# Patient Record
Sex: Male | Born: 1950
Health system: Southern US, Community
[De-identification: ages and names within clinical notes are randomized; demographics above are authoritative.]

## PROBLEM LIST (undated history)

## (undated) DIAGNOSIS — I10 Essential (primary) hypertension: Secondary | ICD-10-CM

## (undated) DIAGNOSIS — I739 Peripheral vascular disease, unspecified: Secondary | ICD-10-CM

## (undated) DIAGNOSIS — I2510 Atherosclerotic heart disease of native coronary artery without angina pectoris: Secondary | ICD-10-CM

## (undated) DIAGNOSIS — I509 Heart failure, unspecified: Secondary | ICD-10-CM

## (undated) DIAGNOSIS — Z720 Tobacco use: Secondary | ICD-10-CM

## (undated) DIAGNOSIS — K8020 Calculus of gallbladder without cholecystitis without obstruction: Secondary | ICD-10-CM

## (undated) DIAGNOSIS — I4821 Permanent atrial fibrillation: Secondary | ICD-10-CM

## (undated) DIAGNOSIS — D696 Thrombocytopenia, unspecified: Secondary | ICD-10-CM

## (undated) DIAGNOSIS — I4819 Other persistent atrial fibrillation: Secondary | ICD-10-CM

## (undated) DIAGNOSIS — Z5189 Encounter for other specified aftercare: Secondary | ICD-10-CM

## (undated) DIAGNOSIS — F1010 Alcohol abuse, uncomplicated: Secondary | ICD-10-CM

## (undated) DIAGNOSIS — F1410 Cocaine abuse, uncomplicated: Secondary | ICD-10-CM

## (undated) DIAGNOSIS — L819 Disorder of pigmentation, unspecified: Secondary | ICD-10-CM

## (undated) DIAGNOSIS — R768 Other specified abnormal immunological findings in serum: Secondary | ICD-10-CM

## (undated) DIAGNOSIS — I499 Cardiac arrhythmia, unspecified: Secondary | ICD-10-CM

## (undated) HISTORY — DX: Disorder of pigmentation, unspecified: L81.9

## (undated) HISTORY — DX: Encounter for other specified aftercare: Z51.89

## (undated) HISTORY — PX: CAROTID ARTERY ANGIOPLASTY: SHX1300

## (undated) HISTORY — PX: ENDOVASCULAR STENT INSERTION: SHX5161

## (undated) HISTORY — PX: OTHER SURGICAL HISTORY: SHX169

## (undated) HISTORY — DX: Peripheral vascular disease, unspecified: I73.9

## (undated) HISTORY — DX: Other persistent atrial fibrillation: I48.19

## (undated) HISTORY — PX: DOPPLER ECHOCARDIOGRAPHY: SHX263

## (undated) HISTORY — PX: CARDIOVASCULAR STRESS TEST: SHX262

## (undated) HISTORY — DX: Heart failure, unspecified: I50.9

## (undated) HISTORY — DX: Thrombocytopenia, unspecified: D69.6

---

## 2007-09-30 ENCOUNTER — Encounter: Admission: RE | Admit: 2007-09-30 | Discharge: 2007-09-30 | Payer: Self-pay | Admitting: Cardiology

## 2007-10-06 ENCOUNTER — Ambulatory Visit (HOSPITAL_COMMUNITY): Admission: RE | Admit: 2007-10-06 | Discharge: 2007-10-06 | Payer: Self-pay | Admitting: Cardiovascular Disease

## 2011-02-05 NOTE — Cardiovascular Report (Signed)
NAME:  Kevin [name], Kevin [name]             ACCOUNT NO.:  [id number]   MEDICAL RECORD NO.:  [id number]          PATIENT TYPE:  AMB   LOCATION:  SDS                          FACILITY:  [facility]   PHYSICIAN:  Richard A. Alanda Amass, M.D.DATE OF BIRTH:  1951-07-07   DATE OF PROCEDURE:  10/06/2007  DATE OF DISCHARGE:                            CARDIAC CATHETERIZATION   PROCEDURE:  Retrograde abdominal aortic catheterization, abdominal  aortic angiogram midstream PA projection, bilateral iliac angiogram  midstream PA projection, right lower extremity run-off using DSA and  step table imaging, left lower extremity run-off using crossover  technique with DSA and step table imaging.   PROCEDURE:  The patient was brought to the second floor PV lab in a post  absorptive state after 5 mg of Valium p.o. premedication.  Preoperative  laboratories showed normal BUN and creatinine of 19 and 0.89, normal CBC  and diff, platelet count 124, normal coags.  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  He is a smoker, has hypertension and has chronic bilateral claudication  right worse than left.  He has had negative Cardiolite for ischemia  workup by Dr. Lynnea Ferrier and an echo has shown LVH with diastolic  relaxation abnormality without significant valvular disease.  He has  been treated medically for hypertension, hyperlipidemia an outpatient  Dopplers were performed to evaluate his chronic bilateral claudication.  RABI was 0.59, LABI 0.82.  He had high-grade proximal left SFA  velocities of 544 cm/sec.  He had occluded mid right SFA with  collaterals and appeared to have possible three-vessel run-off  bilaterally.  No significant iliac disease.  He was referred for  peripheral angiography for symptomatic Fontaine 2B claudication in this  setting.  Informed consent was obtained to proceed with angiography.   The right groin was prepped, draped in the usual manner,  1% Xylocaine  was used for local anesthesia.  The patient was given 2 mg of Versed for  sedation.  Arterial pressures were monitored throughout the procedure  and ranged approximately 160 mmHg with sinus rhythm at 70 per minute.  The RCFA  was entered with a single anterior puncture using 18 thin-wall  needle with modified Seldinger technique and a 5-French short sidearm  sheath was inserted.  A Wholey wire was used to cross the iliac system  and a 5-French pigtail catheter was placed above the level of the renal  arteries.  Abdominal aortic angiogram was done in midstream PA  projection at 20 mL - 20 mL per second.  A second injection was done  above the iliac bifurcation at 20 mL - 20 mL per second.  Right lower  extremity angiography was then done through the side-arm sheath with the  catheter removed at 40 mL 5 mL per second with DSA and step table  imaging.  The left common iliac was then accessed with a Glidewire and a  5-French crossover catheter.  The catheter was advanced to the LEIA and  left lower extremity angiography was done at 40 mL 5 mL per second with  DSA and step table imaging.  Because of high-grade left SFA disease and  consideration for left lower extremity PPI, the arterial sheath and  crossover catheter were exchanged for a 6-French flexible crossover  sheath over a Wholey wire.  The patient was given 3000 units of heparin  in preparation for PPI.  He had been on Plavix and aspirin as an  outpatient.  Dr. Jacinto Halim was consulted and will proceed with PPI.   ANGIOGRAMS:  Abdominal aortic angiogram revealed a patent SMA and celiac  axis.  The renal arteries were  single and normal bilaterally.  The  infrarenal abdominal aorta showed numerous lumbar branches and appeared  normal.  Did not, however visualize the IMA.   The iliac bifurcation was widely patent.  There was irregularities of  the distal third of the LCIA with minimal aneurysmal dilatation and no  stenosis  and good flow.   The hypogastrics were intact bilaterally with irregularities, but no  significant stenosis.   The right SFA had diffuse irregularity and lumpy bumpy appearance in the  proximal third.  There was 40-50% irregularity and narrowing before the  area of total occlusion in the mid SFA.  The area of total occlusion was  probably 4 to 5 cm.  There were profunda collaterals to the distal SFA  beyond Hunter's canal with good filling of the distal SFA and popliteal.   The infrageniculate popliteal artery was occluded in its distal third  with no antegrade filling of the right lower extremity tibial arteries.  There were collaterals from the distal popliteal to the RAT and there  were collaterals from the distal popliteal to the RPT.   There was late filling of the RAT down to the foot with a good caliber  vessel without significant stenosis and there was late filling of the  RPT into the foot without significant stenosis.   The left profunda was widely patent.  The left SFA ostia was patent.  There was a focal 90% concentric stenosis of the left SFA at the  junction of the proximal third.  Beyond this, there was irregularity in  the mid SFA with less to 30-40% narrowing.  The remainder of the SFA was  widely patent.  The left popliteal was patent.  There was 50% narrowing  of the LAT proximally.  A tandem 70-80% narrowing of the mid LAT.  Left  peroneal had 50% proximal narrowing at the proximal third and LPT had no  significant narrowing.  There is three-vessel runoff to the left foot.   The patient has total occlusion of his right mid SFA before Hunter's  canal with collaterals to the distal SFA via the profunda.  He has  collaterals to the anterior and posterior tibial from the distal  infrageniculate popliteal vessel.  He is a potential candidate for  attempts at recanalization of the right SFA and distal popliteal and  tibial angioplasty and/or stenting because of his  symptomatic right  lower extremity claudication.   He has high-grade focal stenosis of his left proximal SFA in the  proximal third with no other high-grade lesions present.  He is  candidate for PTA and/or stenting of this vessel because of his left  lower extremity symptomatic claudication and abnormal ABIs.  Case was  reviewed with Dr. Jacinto Halim and he will proceed with attempted left SFA PTA.   The patient tolerated the diagnostic procedure well.   CATHETERIZATION DIAGNOSIS:  1. Peripheral arterial disease with symptomatic claudication right      greater than  left, Fontaine 2B.      a.     Total occlusion right mid superficial femoral artery with       collaterals.      b.     Total occlusion distal infrageniculate popliteal and tibial       peroneal on the right with two good vessels collateralized RAT and       RPT as outlined above.      c.     High-grade focal left superficial femoral artery stenosis       with three-vessel run-off mid left anterior tibial disease.  2. Systemic hypertension under therapy.  3. Hyperlipidemia.  4. Chronic cigarette abuse.  5. Negative Cardiolite for ischemia without symptoms of chest pain      November 2008.     Richard A. Alanda Amass, M.D.  Electronically Signed    RAW/MEDQ  D:  10/06/2007  T:  10/06/2007  Job:  [id number]

## 2011-02-05 NOTE — Cardiovascular Report (Signed)
NAME:  Kevin [name], Kevin [name]             ACCOUNT NO.:  [id number]   MEDICAL RECORD NO.:  [id number]          PATIENT TYPE:  AMB   LOCATION:  SDS                          FACILITY:  [facility]   PHYSICIAN:  Vonna Kotyk R. Jacinto Halim, MD       DATE OF BIRTH:  Jul 29, 1951   DATE OF PROCEDURE:  10/06/2007  DATE OF DISCHARGE:                            CARDIAC CATHETERIZATION   DATE OF PROCEDURE:  10/06/07.   REFERRING PHYSICIAN:  Richard A. Alanda Amass, M.D.   PROCEDURE PERFORMED:  PTA and balloon angioplasty of the left SFA.   INDICATIONS:  [name] is a 56-year gentleman with symptoms  of claudication.  He had undergone outpatient Doppler evaluation which  had revealed a high-grade stenosis of the left SFA and also severe  peripheral arterial disease of the right SFA.  For complete details of  the history, please also look at Dr. Rocco Serene notes.  Because  of lifestyle-limiting claudication, he underwent diagnostic cardiac  catheterization which revealed a very focal web-like 90% stenosis of the  left SFA.  I was called in to evaluate for possible percutaneous  revascularization.   INTERVENTION:  Successful PTA and balloon angioplasty of the left SFA  utilizing a 5 x 20 mm Agiltrac balloon performed at a peak of 14  atmospheres pressure.  This was performed from anywhere from 45 seconds  to 60 seconds.  Post balloon angioplasty, excellent results were noted  with less than 10% residual stenosis.   RECOMMENDATIONS:  The patient will be continued on aggressive risk  modification.  He has an occluded right SFA and also an occluded right  anterior tibial artery which is a very focal occlusion, and he will need  revascularization of the same given his symptoms of claudication.  He  will be brought back for revascularization at a later date as deemed  appropriate by Dr. Susa Griffins.  Overall the patient tolerated the  procedure well.  No complications were noted.   Right femoral  arterial access was closed with StarClose with excellent  hemostasis.   TECHNIQUE OF PROCEDURE:  Please see dictation from Dr. Alanda Amass  regarding diagnostic cardiac catheterization.   TECHNIQUE OF INTERVENTION:  Using heparin for anticoagulation, keeping  value greater than 200, a crossover catheter was utilized to cross from  the right femoral artery to the left femoral artery using a 5-French  crossover catheter over a guidewire.  Having done this, the guidewire  was advanced into the left SFA and an Agiltrac balloon was advanced to  the site of the stenosis and balloon angioplasty was performed at 10  atmospheres of pressure, then at 14 x 2 atmospheres of pressure.  Post  angioplasty, angiography revealed excellent results.  The  crossover sheath was then gently pulled back into the right femoral  artery.  Right femoral arteriography was performed and arterial access  closed with StarClose with excellent hemostasis.  The patient tolerated  the procedure well.      Cristy Hilts. Jacinto Halim, MD  Electronically Signed     JRG/MEDQ  D:  10/06/2007  T:  10/06/2007  Job:  [id number]  cc:   Richard A. Rollene Fare, M.D.

## 2011-05-31 ENCOUNTER — Emergency Department (HOSPITAL_COMMUNITY): Payer: No Typology Code available for payment source

## 2011-05-31 ENCOUNTER — Emergency Department (HOSPITAL_COMMUNITY)
Admission: EM | Admit: 2011-05-31 | Discharge: 2011-06-01 | Disposition: A | Discharge status: Home / Self Care | Payer: No Typology Code available for payment source | Attending: Emergency Medicine | Admitting: Emergency Medicine

## 2011-05-31 DIAGNOSIS — I10 Essential (primary) hypertension: Secondary | ICD-10-CM | POA: Insufficient documentation

## 2011-05-31 DIAGNOSIS — M545 Low back pain, unspecified: Secondary | ICD-10-CM | POA: Insufficient documentation

## 2011-05-31 DIAGNOSIS — M542 Cervicalgia: Secondary | ICD-10-CM | POA: Insufficient documentation

## 2011-05-31 DIAGNOSIS — R071 Chest pain on breathing: Secondary | ICD-10-CM | POA: Insufficient documentation

## 2011-05-31 DIAGNOSIS — M5380 Other specified dorsopathies, site unspecified: Secondary | ICD-10-CM | POA: Insufficient documentation

## 2011-06-13 LAB — PLATELET COUNT: Platelets: 128 — ABNORMAL LOW

## 2012-04-23 DIAGNOSIS — R768 Other specified abnormal immunological findings in serum: Secondary | ICD-10-CM

## 2012-04-23 HISTORY — DX: Other specified abnormal immunological findings in serum: R76.8

## 2012-04-30 ENCOUNTER — Inpatient Hospital Stay (HOSPITAL_COMMUNITY)
Admission: EM | Admit: 2012-04-30 | Discharge: 2012-05-06 | DRG: 124 | Disposition: A | Discharge status: Home / Self Care | Payer: BC Managed Care – PPO | Attending: Cardiology | Admitting: Cardiology

## 2012-04-30 ENCOUNTER — Encounter (HOSPITAL_COMMUNITY): Payer: Self-pay | Admitting: Physician Assistant

## 2012-04-30 ENCOUNTER — Inpatient Hospital Stay (HOSPITAL_COMMUNITY): Payer: BC Managed Care – PPO

## 2012-04-30 ENCOUNTER — Emergency Department (HOSPITAL_COMMUNITY): Payer: BC Managed Care – PPO

## 2012-04-30 DIAGNOSIS — I10 Essential (primary) hypertension: Secondary | ICD-10-CM | POA: Diagnosis present

## 2012-04-30 DIAGNOSIS — R5381 Other malaise: Secondary | ICD-10-CM | POA: Diagnosis present

## 2012-04-30 DIAGNOSIS — I4891 Unspecified atrial fibrillation: Secondary | ICD-10-CM | POA: Diagnosis present

## 2012-04-30 DIAGNOSIS — R0601 Orthopnea: Secondary | ICD-10-CM

## 2012-04-30 DIAGNOSIS — I129 Hypertensive chronic kidney disease with stage 1 through stage 4 chronic kidney disease, or unspecified chronic kidney disease: Secondary | ICD-10-CM | POA: Diagnosis present

## 2012-04-30 DIAGNOSIS — R079 Chest pain, unspecified: Secondary | ICD-10-CM | POA: Diagnosis present

## 2012-04-30 DIAGNOSIS — N189 Chronic kidney disease, unspecified: Secondary | ICD-10-CM | POA: Diagnosis present

## 2012-04-30 DIAGNOSIS — F17200 Nicotine dependence, unspecified, uncomplicated: Secondary | ICD-10-CM | POA: Diagnosis present

## 2012-04-30 DIAGNOSIS — I4729 Other ventricular tachycardia: Secondary | ICD-10-CM | POA: Diagnosis not present

## 2012-04-30 DIAGNOSIS — I5031 Acute diastolic (congestive) heart failure: Secondary | ICD-10-CM | POA: Diagnosis present

## 2012-04-30 DIAGNOSIS — I472 Ventricular tachycardia, unspecified: Secondary | ICD-10-CM | POA: Diagnosis present

## 2012-04-30 DIAGNOSIS — E669 Obesity, unspecified: Secondary | ICD-10-CM | POA: Diagnosis present

## 2012-04-30 DIAGNOSIS — R7989 Other specified abnormal findings of blood chemistry: Secondary | ICD-10-CM | POA: Diagnosis present

## 2012-04-30 DIAGNOSIS — Z79899 Other long term (current) drug therapy: Secondary | ICD-10-CM

## 2012-04-30 DIAGNOSIS — I739 Peripheral vascular disease, unspecified: Secondary | ICD-10-CM | POA: Diagnosis present

## 2012-04-30 DIAGNOSIS — K8020 Calculus of gallbladder without cholecystitis without obstruction: Secondary | ICD-10-CM | POA: Diagnosis present

## 2012-04-30 DIAGNOSIS — Z720 Tobacco use: Secondary | ICD-10-CM | POA: Diagnosis present

## 2012-04-30 DIAGNOSIS — F1410 Cocaine abuse, uncomplicated: Secondary | ICD-10-CM | POA: Diagnosis present

## 2012-04-30 DIAGNOSIS — D696 Thrombocytopenia, unspecified: Secondary | ICD-10-CM | POA: Diagnosis present

## 2012-04-30 DIAGNOSIS — F1010 Alcohol abuse, uncomplicated: Secondary | ICD-10-CM | POA: Diagnosis present

## 2012-04-30 DIAGNOSIS — N289 Disorder of kidney and ureter, unspecified: Secondary | ICD-10-CM | POA: Diagnosis present

## 2012-04-30 DIAGNOSIS — Z9861 Coronary angioplasty status: Secondary | ICD-10-CM

## 2012-04-30 DIAGNOSIS — Z7901 Long term (current) use of anticoagulants: Secondary | ICD-10-CM

## 2012-04-30 DIAGNOSIS — R0602 Shortness of breath: Secondary | ICD-10-CM | POA: Diagnosis present

## 2012-04-30 HISTORY — DX: Cocaine abuse, uncomplicated: F14.10

## 2012-04-30 HISTORY — DX: Calculus of gallbladder without cholecystitis without obstruction: K80.20

## 2012-04-30 HISTORY — DX: Peripheral vascular disease, unspecified: I73.9

## 2012-04-30 HISTORY — DX: Alcohol abuse, uncomplicated: F10.10

## 2012-04-30 HISTORY — DX: Tobacco use: Z72.0

## 2012-04-30 HISTORY — DX: Cardiac arrhythmia, unspecified: I49.9

## 2012-04-30 HISTORY — DX: Essential (primary) hypertension: I10

## 2012-04-30 LAB — CARDIAC PANEL(CRET KIN+CKTOT+MB+TROPI)
CK, MB: 3.8 ng/mL (ref 0.3–4.0)
CK, MB: 3 ng/mL (ref 0.3–4.0)
Relative Index: 1.6 (ref 0.0–2.5)
Relative Index: 1.8 (ref 0.0–2.5)
Total CK: 235 U/L — ABNORMAL HIGH (ref 7–232)
Total CK: 169 U/L (ref 7–232)
Troponin I: <0.3 ng/mL (ref <0.30)
Troponin I: <0.3 ng/mL (ref <0.30)

## 2012-04-30 LAB — CBC WITH DIFFERENTIAL/PLATELET
Basophils Absolute: 0 10*3/uL (ref 0.0–0.1)
Basophils Relative: 0 % (ref 0–1)
Eosinophils Absolute: 0.1 10*3/uL (ref 0.0–0.7)
Eosinophils Relative: 1 % (ref 0–5)
HCT: 42.6 % (ref 39.0–52.0)
Hemoglobin: 14.4 g/dL (ref 13.0–17.0)
Lymphocytes Relative: 13 % (ref 12–46)
Lymphs Abs: 1.3 10*3/uL (ref 0.7–4.0)
MCH: 34.3 pg — ABNORMAL HIGH (ref 26.0–34.0)
MCHC: 33.8 g/dL (ref 30.0–36.0)
MCV: 101.4 fL — ABNORMAL HIGH (ref 78.0–100.0)
Monocytes Absolute: 1.3 10*3/uL — ABNORMAL HIGH (ref 0.1–1.0)
Monocytes Relative: 13 % — ABNORMAL HIGH (ref 3–12)
Neutro Abs: 7.3 10*3/uL (ref 1.7–7.7)
Neutrophils Relative %: 73 % (ref 43–77)
Platelets: 56 10*3/uL — ABNORMAL LOW (ref 150–400)
RBC: 4.2 MIL/uL — ABNORMAL LOW (ref 4.22–5.81)
RDW: 12.5 % (ref 11.5–15.5)
WBC: 10 10*3/uL (ref 4.0–10.5)

## 2012-04-30 LAB — COMPREHENSIVE METABOLIC PANEL
ALT: 67 U/L — ABNORMAL HIGH (ref 0–53)
AST: 53 U/L — ABNORMAL HIGH (ref 0–37)
Albumin: 3.7 g/dL (ref 3.5–5.2)
Alkaline Phosphatase: 119 U/L — ABNORMAL HIGH (ref 39–117)
BUN: 29 mg/dL — ABNORMAL HIGH (ref 6–23)
CO2: 26 mEq/L (ref 19–32)
Calcium: 8.9 mg/dL (ref 8.4–10.5)
Chloride: 103 mEq/L (ref 96–112)
Creatinine, Ser: 1.53 mg/dL — ABNORMAL HIGH (ref 0.50–1.35)
GFR calc Af Amer: 55 mL/min — ABNORMAL LOW (ref >90)
GFR calc non Af Amer: 48 mL/min — ABNORMAL LOW (ref >90)
Glucose, Bld: 173 mg/dL — ABNORMAL HIGH (ref 70–99)
Potassium: 4.1 mEq/L (ref 3.5–5.1)
Sodium: 139 mEq/L (ref 135–145)
Total Bilirubin: 0.8 mg/dL (ref 0.3–1.2)
Total Protein: 6.9 g/dL (ref 6.0–8.3)

## 2012-04-30 LAB — POCT I-STAT TROPONIN I: Troponin i, poc: 0.02 ng/mL (ref 0.00–0.08)

## 2012-04-30 LAB — TSH: TSH: 1.773 u[IU]/mL (ref 0.350–4.500)

## 2012-04-30 LAB — APTT: aPTT: 69 seconds — ABNORMAL HIGH (ref 24–37)

## 2012-04-30 LAB — PROTIME-INR
INR: 1.82 — ABNORMAL HIGH (ref 0.00–1.49)
Prothrombin Time: 21.4 seconds — ABNORMAL HIGH (ref 11.6–15.2)

## 2012-04-30 LAB — PRO B NATRIURETIC PEPTIDE: Pro B Natriuretic peptide (BNP): 2291 pg/mL — ABNORMAL HIGH (ref 0–125)

## 2012-04-30 LAB — TECHNOLOGIST SMEAR REVIEW: Tech Review: DECREASED

## 2012-04-30 MED ORDER — SODIUM CHLORIDE 0.9 % IJ SOLN
3.0000 mL | Freq: Two times a day (BID) | INTRAMUSCULAR | Status: DC
  Administered 2012-04-30 (×2): 3 mL via INTRAVENOUS

## 2012-04-30 MED ORDER — RIVAROXABAN 10 MG PO TABS: 10.0000 mg | ORAL_TABLET | Freq: Every day | ORAL | Status: DC

## 2012-04-30 MED ORDER — DILTIAZEM HCL 60 MG PO TABS
60.0000 mg | ORAL_TABLET | Freq: Three times a day (TID) | ORAL | Status: DC
  Administered 2012-04-30 – 2012-05-01 (×3): 60 mg via ORAL
  Filled 2012-04-30 (×6): qty 1

## 2012-04-30 MED ORDER — ONDANSETRON HCL 4 MG/2ML IJ SOLN: 4.0000 mg | Freq: Four times a day (QID) | INTRAMUSCULAR | Status: DC | PRN

## 2012-04-30 MED ORDER — ONDANSETRON HCL 4 MG/2ML IJ SOLN: 4.0000 mg | Freq: Three times a day (TID) | INTRAMUSCULAR | Status: AC | PRN

## 2012-04-30 MED ORDER — SODIUM CHLORIDE 0.9 % IV SOLN: 250.0000 mL | INTRAVENOUS | Status: DC | PRN

## 2012-04-30 MED ORDER — METOPROLOL TARTRATE 25 MG PO TABS
50.0000 mg | ORAL_TABLET | Freq: Two times a day (BID) | ORAL | Status: DC
  Administered 2012-04-30: 50 mg via ORAL
  Filled 2012-04-30: qty 2

## 2012-04-30 MED ORDER — ONDANSETRON HCL 4 MG PO TABS: 4.0000 mg | ORAL_TABLET | Freq: Four times a day (QID) | ORAL | Status: DC | PRN

## 2012-04-30 MED ORDER — SODIUM CHLORIDE 0.9 % IJ SOLN
3.0000 mL | Freq: Two times a day (BID) | INTRAMUSCULAR | Status: DC
  Administered 2012-05-01: 3 mL via INTRAVENOUS

## 2012-04-30 MED ORDER — ACETAMINOPHEN 325 MG PO TABS: 650.0000 mg | ORAL_TABLET | Freq: Four times a day (QID) | ORAL | Status: DC | PRN

## 2012-04-30 MED ORDER — FOLIC ACID 1 MG PO TABS
1.0000 mg | ORAL_TABLET | Freq: Every day | ORAL | Status: DC
  Administered 2012-04-30 – 2012-05-06 (×6): 1 mg via ORAL
  Filled 2012-04-30 (×7): qty 1

## 2012-04-30 MED ORDER — ACETAMINOPHEN 650 MG RE SUPP: 650.0000 mg | Freq: Four times a day (QID) | RECTAL | Status: DC | PRN

## 2012-04-30 MED ORDER — HYDROCORTISONE 1 % EX CREA
1.0000 "application " | TOPICAL_CREAM | Freq: Three times a day (TID) | CUTANEOUS | Status: DC
  Administered 2012-04-30 – 2012-05-06 (×16): 1 via TOPICAL
  Filled 2012-04-30 (×2): qty 28

## 2012-04-30 MED ORDER — FUROSEMIDE 10 MG/ML IJ SOLN
20.0000 mg | Freq: Once | INTRAMUSCULAR | Status: AC
  Administered 2012-04-30: 20 mg via INTRAVENOUS
  Filled 2012-04-30 (×2): qty 2

## 2012-04-30 MED ORDER — PREDNISONE 20 MG PO TABS
20.0000 mg | ORAL_TABLET | Freq: Every day | ORAL | Status: DC
  Administered 2012-04-30 – 2012-05-06 (×6): 20 mg via ORAL
  Filled 2012-04-30 (×7): qty 1

## 2012-04-30 MED ORDER — LORAZEPAM 2 MG/ML IJ SOLN
1.0000 mg | Freq: Four times a day (QID) | INTRAMUSCULAR | Status: AC | PRN
  Administered 2012-04-30 – 2012-05-01 (×2): 1 mg via INTRAVENOUS
  Filled 2012-04-30 (×2): qty 1

## 2012-04-30 MED ORDER — VITAMIN B-1 100 MG PO TABS
100.0000 mg | ORAL_TABLET | Freq: Every day | ORAL | Status: DC
  Administered 2012-04-30 – 2012-05-06 (×6): 100 mg via ORAL
  Filled 2012-04-30 (×8): qty 1

## 2012-04-30 MED ORDER — ADULT MULTIVITAMIN W/MINERALS CH
1.0000 | ORAL_TABLET | Freq: Every day | ORAL | Status: DC
  Administered 2012-04-30 – 2012-05-06 (×6): 1 via ORAL
  Filled 2012-04-30 (×8): qty 1

## 2012-04-30 MED ORDER — DOCUSATE SODIUM 100 MG PO CAPS
100.0000 mg | ORAL_CAPSULE | Freq: Two times a day (BID) | ORAL | Status: DC
  Administered 2012-04-30 – 2012-05-06 (×11): 100 mg via ORAL
  Filled 2012-04-30 (×13): qty 1

## 2012-04-30 MED ORDER — ASPIRIN 81 MG PO CHEW
324.0000 mg | CHEWABLE_TABLET | ORAL | Status: AC
  Administered 2012-05-01: 324 mg via ORAL
  Filled 2012-04-30: qty 4

## 2012-04-30 MED ORDER — HYDROCODONE-ACETAMINOPHEN 5-325 MG PO TABS: 1.0000 | ORAL_TABLET | ORAL | Status: DC | PRN

## 2012-04-30 MED ORDER — SODIUM CHLORIDE 0.9 % IJ SOLN: 3.0000 mL | INTRAMUSCULAR | Status: DC | PRN

## 2012-04-30 MED ORDER — HYDRALAZINE HCL 25 MG PO TABS
25.0000 mg | ORAL_TABLET | Freq: Three times a day (TID) | ORAL | Status: DC
  Administered 2012-04-30 – 2012-05-01 (×3): 25 mg via ORAL
  Filled 2012-04-30 (×6): qty 1

## 2012-04-30 MED ORDER — LORAZEPAM 0.5 MG PO TABS: 1.0000 mg | ORAL_TABLET | Freq: Four times a day (QID) | ORAL | Status: AC | PRN

## 2012-04-30 MED ORDER — NITROGLYCERIN 0.4 MG SL SUBL: 0.4000 mg | SUBLINGUAL_TABLET | SUBLINGUAL | Status: DC | PRN

## 2012-04-30 MED ORDER — ASPIRIN 81 MG PO CHEW
324.0000 mg | CHEWABLE_TABLET | Freq: Once | ORAL | Status: AC
  Administered 2012-04-30: 324 mg via ORAL
  Filled 2012-04-30: qty 4

## 2012-04-30 MED ORDER — HEPARIN (PORCINE) IN NACL 100-0.45 UNIT/ML-% IJ SOLN
1350.0000 [IU]/h | INTRAMUSCULAR | Status: DC
  Administered 2012-04-30: 1300 [IU]/h via INTRAVENOUS
  Administered 2012-05-01: 1350 [IU]/h via INTRAVENOUS
  Filled 2012-04-30 (×3): qty 250

## 2012-04-30 MED ORDER — SODIUM CHLORIDE 0.9 % IV SOLN
1.0000 mL/kg/h | INTRAVENOUS | Status: DC
  Administered 2012-04-30: 1 mL/kg/h via INTRAVENOUS
  Administered 2012-05-01: 0.5 mL/kg/h via INTRAVENOUS

## 2012-04-30 MED ORDER — MORPHINE SULFATE 2 MG/ML IJ SOLN: 1.0000 mg | INTRAMUSCULAR | Status: DC | PRN

## 2012-04-30 MED ORDER — SODIUM CHLORIDE 0.9 % IJ SOLN
3.0000 mL | Freq: Two times a day (BID) | INTRAMUSCULAR | Status: DC
  Administered 2012-04-30: 3 mL via INTRAVENOUS

## 2012-04-30 NOTE — ED Notes (Signed)
Pt reports sudden onset of mid-sternum chest pain starting 0300 this am, sob, productive cough, and weakness. Pt denies N/V/D, pt states "I feel like I'm gasping for air." pt speaking in phrases

## 2012-04-30 NOTE — ED Notes (Signed)
Pt changed into gown.  MD & wife at bedside.

## 2012-04-30 NOTE — H&P (Signed)
[facility] History and Physical  Kevin [name] ZOX:[id number] DOB: [birth year] DOA: 04/30/2012   PCP: Jackie Plum, MD   Chief Complaint: Chest pain and SOB.   HPI:  61 year old with PMH significant for PVD, Alcohol use, Cocaine use, Current smoker, hypertension who presents to ED complaining of SOB and chest pain that started night prior to admission. He wake up coughing, and with chest pain and SOB. He does relates that he used cocaine day prior to admission. He describe orthopnea. Denies lower extremities swelling.  He is breathing better now and is chest pain free. He was recently started on medications for HTN and was also started on Xarelto by PCP. He has not been taking medications for last 2 years.  He had bowel movement day prior to admission. He feels his abdomen is more distended.   Review of Systems:  Constitutional:  No weight loss, night sweats, Fevers, chills, fatigue.  HEENT:  No headaches, Difficulty swallowing,Tooth/dental problems,Sore throat,  No sneezing, itching, ear ache, nasal congestion, post nasal drip,   GI:  No heartburn, indigestion, abdominal pain, nausea, vomiting, diarrhea, change in bowel habits, loss of appetite  Resp:  No shortness of breath with exertion or at rest. No excess mucus, no productive cough, No non-productive cough, No coughing up of blood.No change in color of mucus.No wheezing.No chest wall deformity  Skin:  no rash or lesions.  GU:  no dysuria, change in color of urine, no urgency or frequency. No flank pain.  Musculoskeletal:  No joint pain or swelling. No decreased range of motion. No back pain.  Psych:  No change in mood or affect. No depression or anxiety. No memory loss.    Past Medical History  Diagnosis Date  . PAD (peripheral artery disease)   . Hypertension   . Cocaine abuse   . ETOH abuse   . Tobacco abuse    History reviewed. No pertinent past surgical history.  Social History:  reports that he has  been smoking Cigarettes.  He has a 10 pack-year smoking history. He does not have any smokeless tobacco history on file. He reports that he drinks about 8 ounces of alcohol per week. He reports that he uses illicit drugs (Cocaine). He is married, he has 5 children. He works in [employer], multimedia with chemicals.   No Known Allergies  Family history: Father had history of Diabetes, Mother history of Hearth Diseases. Both are deceased.   Prior to Admission medications   Medication Sig Start Date End Date Taking? Authorizing Provider  hydrocortisone cream 1 % Apply 1 application topically 3 (three) times daily.   Yes Historical Provider, MD  metoprolol (LOPRESSOR) 50 MG tablet Take 50 mg by mouth 2 (two) times daily.   Yes Historical Provider, MD  predniSONE (DELTASONE) 20 MG tablet Take 20 mg by mouth daily.   Yes Historical Provider, MD  rivaroxaban (XARELTO) 10 MG TABS tablet Take 10 mg by mouth daily.   Yes Historical Provider, MD   Physical Exam: Filed Vitals:   04/30/12 1130 04/30/12 1132 04/30/12 1149 04/30/12 1200  BP: 152/111 152/111  166/116  Pulse: 138 104  94  Temp:      TempSrc:      Resp:      Height:   6' (1.829 m)   Weight:   102.059 kg (225 lb)   SpO2: 96% 22%  97%   BP 166/116  Pulse 94  Temp 98.2 F (36.8 C) (Oral)  Resp 18  Ht 6' (  1.829 m)  Wt 102.059 kg (225 lb)  BMI 30.52 kg/m2  SpO2 97%  General Appearance:    Alert, cooperative, no distress, appears stated age  Head:    Normocephalic, without obvious abnormality, atraumatic  Eyes:    PERRL, conjunctiva/corneas clear, EOM's intact,        Ears:    Normal TM's and external ear canals, both ears  Nose:   Nares normal, septum midline, mucosa normal, no drainage    or sinus tenderness  Throat:   Lips, mucosa, and tongue normal; teeth and gums normal  Neck:   Supple, symmetrical, trachea midline, no adenopathy;       thyroid:  No enlargement/tenderness/nodules; no carotid   bruit or JVD  Back:     Symmetric, no  curvature, ROM normal, no CVA tenderness  Lungs:     Clear to auscultation bilaterally, respirations unlabored     Heart:    Regular rate and rhythm, S1 and S2 normal, no murmur, rub   or gallop  Abdomen:     Soft, non-tender, bowel sounds active all four quadrants,    no masses, no organomegaly        Extremities:   Extremities normal, atraumatic, no cyanosis or edema  Pulses:   2+ and symmetric all extremities  Skin:   Skin color, texture, turgor normal, no rashes or lesions  Lymph nodes:   Cervical, supraclavicular, and axillary nodes normal  Neurologic:   CNII-XII intact. Normal strength, sensation and reflexes      throughout    Labs on Admission:  Basic Metabolic Panel:  Lab 04/30/12 1610  NA 139  K 4.1  CL 103  CO2 26  GLUCOSE 173*  BUN 29*  CREATININE 1.53*  CALCIUM 8.9  MG --  PHOS --   Liver Function Tests:  Lab 04/30/12 0803  AST 53*  ALT 67*  ALKPHOS 119*  BILITOT 0.8  PROT 6.9  ALBUMIN 3.7   CBC:  Lab 04/30/12 0803  WBC 10.0  NEUTROABS 7.3  HGB 14.4  HCT 42.6  MCV 101.4*  PLT 56*    Radiological Exams on Admission: Dg Chest Portable 1 View  04/30/2012  *RADIOLOGY REPORT*  Clinical Data: Chest pain  PORTABLE CHEST - 1 VIEW  Comparison: June 01, 2011 and September 30, 2007  Findings: The cardiac silhouette, mediastinum, pulmonary vasculature are within normal limits.  Both lungs are clear.  There is stable, chronic elevation of the right hemi diaphragm.  The left costophrenic angle has been cut off the film. There is no acute bony abnormality.  IMPRESSION: There is no evidence of acute cardiac or pulmonary process.  Original Report Authenticated By: Brandon Melnick, M.D.    EKG: T wave inversion lead  2, 3  And V 4 5 6   Assessment/Plan:   1- Chest pain: concern for angina, ACS in patient with multiple risk factors, current smoker, cocaine use, PVD, HTN.  Patient will be admitted to telemetry, cycle cardiac enzymes, aspirin, No BB due to cocaine  use. Nitroglycerine and morphine for pain as needed. Chest pain probably setting cocaine use, A fib. Cardiology consult appreciated. Lipid panel, TSH, HB-A1c.   2 SOB (shortness of breath); Multifactorial A fib, component HF, ACS. Receive 1 dose lasix. Controlled A fib.    3-Atrial fibrillation: in setting Cocaine use, alcohol use, Cycle cardiac enzymes. Cardizem started and heparin. Need to be cautious with heparin in setting thrombocytopenia.   4-PAD (peripheral artery disease): Doppler outpatient.   5-  Cocaine abuse: Counseling provide.   6-HTN (hypertension): Continue with Hydralazine.   7-Tobacco abuse: Counseling provide.   8-ETOH abuse: Monitor on CIWA protocol. Thiamine and folate.   9- Elevated LFT: Probably related to alcohol use. Check hepatitis panel, HIV, RUQ [place].  10- Acute renal insufficiency: Probably decrease perfusion. Repeat B-met am. Avoid nephrotoxic.  11-Thrombocytopenia: Probably related to alcohol use. [place] to evaluate for cirrhosis. Monitor closely on heparin for bleed.    Time spend: more than 45 minutes.  Family Communication: Wife at bedside, plan of care discussed with wife and patient.    Hartley Barefoot, MD  [facility] Pager [phone]  If 7PM-7AM, please contact night-coverage [email or web address] Password [facility] 04/30/2012, 12:38 PM

## 2012-04-30 NOTE — ED Notes (Signed)
MD at bedside. 

## 2012-04-30 NOTE — Progress Notes (Signed)
ANTICOAGULATION CONSULT NOTE - Initial Consult  Pharmacy Consult for IV heparin (Xarelto on hold) Indication: chest pain/ACS, afib  No Known Allergies  Patient Measurements: Height: 6\' 6"  (198.1 cm) Weight: 234 lb 5.6 oz (106.3 kg) IBW/kg (Calculated) : 91.4  Heparin Dosing Weight: 106.3 kg  Vital Signs: Temp: 98 F (36.7 C) (08/08 1449) Temp src: Axillary (08/08 1449) BP: 178/106 mmHg (08/08 1449) Pulse Rate: 87  (08/08 1449)  Labs:  Basename 04/30/12 1303 04/30/12 0847 04/30/12 0803  HGB -- -- 14.4  HCT -- -- 42.6  PLT -- -- 56*  APTT -- -- --  LABPROT -- 21.4* --  INR -- 1.82* --  HEPARINUNFRC -- -- --  CREATININE -- -- 1.53*  CKTOTAL 235* -- --  CKMB 3.8 -- --  TROPONINI <0.30 -- --    Estimated Creatinine Clearance: 66.4 ml/min (by C-G formula based on Cr of 1.53).   Medical History: Past Medical History  Diagnosis Date  . PAD (peripheral artery disease)   . Hypertension   . Cocaine abuse   . ETOH abuse   . Tobacco abuse     Medications:  Scheduled:    . aspirin  324 mg Oral Once  . diltiazem  60 mg Oral Q8H  . docusate sodium  100 mg Oral BID  . folic acid  1 mg Oral Daily  . furosemide  20 mg Intravenous Once  . hydrALAZINE  25 mg Oral Q8H  . hydrocortisone cream  1 application Topical TID  . multivitamin with minerals  1 tablet Oral Daily  . predniSONE  20 mg Oral Daily  . sodium chloride  3 mL Intravenous Q12H  . sodium chloride  3 mL Intravenous Q12H  . thiamine  100 mg Oral Daily  . DISCONTD: metoprolol  50 mg Oral BID  . DISCONTD: rivaroxaban  10 mg Oral Daily    Assessment: 61 yo male on chronic Xarelto for afib (newly diagnosed) admitted with chest pain.  Also with hx cocaine use (last yesterday).  Pharmacy asked to transition Xarelto to IV heparin.  Last Xarelto dose yesterday ~ 2 PM per patient's wife.  Of note, patients platelet count is 56. Spoke with Wilburt Finlay, PA; per Dr. Rennis Golden proceed with IV heparin for now.  Goal of  Therapy:  Heparin level 0.3-0.7 units/ml Monitor platelets by anticoagulation protocol: Yes   Plan:  1. Start IV heparin with no bolus at 1300 units/hr. 2. Check heparin level in 6 hrs. (there may still be a little residual effect from Xarelto at this point) 3. Daily heparin level and CBC. 4. Monitor for signs and symptoms of bleeding.  Gardner Candle 04/30/2012,4:08 PM

## 2012-04-30 NOTE — Consult Note (Addendum)
Pt. Seen and examined. Agree with the NP/PA-C note as written. 61 yo male with PAD, tobacco and etoh abuse, and cocaine use, along with a history of PAD and peripheral angioplasty at the time. He now presents with chest pain which is concerning for unstable angina. He had recent onset a-fib and was placed on xarelto. High likelihood of OSA, however, his wife says that he does not snore.  BNP is elevated at 2291. Initial troponin is negative. There is chronic kidney disease with a creatinine of 1.53.  EKG shows ST depression laterally.  I would recommend LHC in the am.  Plan for hydration to see if Creatinine decreases (despite increased BNP). If he develops more dyspnea, we could diurese at that time. Agree with IV heparin instead of xarelto. Agree with 2D echo. Would not purse TEE/DCCV until cardiac anatomy is revealed - he may need CABG. Also, we should try to identify and treat OSA first.   Will treat with low dose aspirin 81 mg. No plavix/effient or ticagrelor currently due to thrombocytopenia. ?etiology.  Check smear for schistocytes or platelet clumping.  This may preclude stenting until we can identify a cause. It should not interfere with a diagnostic catheterization.  Chrystie Nose, MD, [facility] Attending Cardiologist [facility]

## 2012-04-30 NOTE — ED Provider Notes (Signed)
History     CSN: [id number]  Arrival date & time 04/30/12  0756   First MD Initiated Contact with Patient 04/30/12 [phone]      Chief Complaint  Patient presents with  . Chest Pain    (Consider location/radiation/quality/duration/timing/severity/associated sxs/prior treatment) Patient is a 61 y.o. male presenting with shortness of breath and chest pain. The history is provided by the patient and the spouse.  Shortness of Breath  The current episode started today (5 hours PTA). The onset was gradual. The problem occurs frequently. The problem has been gradually improving. The problem is moderate. Nothing relieves the symptoms. The symptoms are aggravated by a supine position. Associated symptoms include chest pain, chest pressure, orthopnea, cough and shortness of breath. Pertinent negatives include no fever and no wheezing. It is unknown what precipitates the cough. The cough is non-productive and hacking. Nothing relieves the cough. The cough is worsened by activity and a supine position. Past medical history comments: Afib. Recently, medical care has been given by the PCP. Services received include tests performed and one or more referrals (Pt to see a cardiologist tomorrow.).  Chest Pain The chest pain began 1 - 2 hours ago. Chest pain occurs intermittently. The chest pain is improving. The pain is associated with exertion. The severity of the pain is mild. The quality of the pain is described as pressure-like. The pain does not radiate. Chest pain is worsened by exertion. Primary symptoms include shortness of breath and cough. Pertinent negatives for primary symptoms include no fever, no wheezing, no palpitations, no nausea, no vomiting and no dizziness.  The cough began more than 1 week ago. The cough is chronic (worsening). The cough is non-productive.  Associated symptoms include orthopnea.  Pertinent negatives for associated symptoms include no claudication and no lower extremity edema. He  tried nothing for the symptoms. Risk factors include alcohol intake, male gender, lack of exercise, smoking/tobacco exposure and substance abuse. Past medical history comments: Afib     Past Medical History  Diagnosis Date  . PAD (peripheral artery disease)   . Hypertension   . Cocaine abuse   . ETOH abuse   . Tobacco abuse     Past Surgical History  Procedure Date  . Endovascular stent insertion right leg    History reviewed. No pertinent family history.  History  Substance Use Topics  . Smoking status: Current Everyday Smoker -- 0.2 packs/day for 40 years    Types: Cigarettes  . Smokeless tobacco: Not on file  . Alcohol Use: 8.0 oz/week    16 drink(s) per week     He drinks 1/2 pint daily      Review of Systems  Constitutional: Negative for fever and chills.  HENT: Negative.   Eyes: Negative.   Respiratory: Positive for cough and shortness of breath. Negative for wheezing.   Cardiovascular: Positive for chest pain and orthopnea. Negative for palpitations and claudication.  Gastrointestinal: Negative for nausea, vomiting, diarrhea and blood in stool.  Genitourinary: Negative.   Musculoskeletal: Negative.   Skin: Positive for rash (b/l arms).  Neurological: Negative for dizziness, syncope and light-headedness.  All other systems reviewed and are negative.    Allergies  Review of patient's allergies indicates no known allergies.  Home Medications  No current outpatient prescriptions on file.  BP 178/106  Pulse 87  Temp 98 F (36.7 C) (Axillary)  Resp 20  Ht 6\' 6"  (1.981 m)  Wt 234 lb 5.6 oz (106.3 kg)  BMI 27.08 kg/m2  SpO2 96%  Physical Exam  Nursing note and vitals reviewed. Constitutional: He is oriented to person, place, and time. He appears well-developed and well-nourished. No distress.  HENT:  Head: Normocephalic and atraumatic.  Eyes: Conjunctivae are normal.  Neck: Neck supple.  Cardiovascular: Normal rate, normal heart sounds and intact  distal pulses.  An irregularly irregular rhythm present.  No murmur heard. Pulmonary/Chest: Effort normal and breath sounds normal. He has no wheezes. He has no rales.       Prolonged expiratory phase throughout  Abdominal: Soft. He exhibits no distension. There is no tenderness.  Musculoskeletal: Normal range of motion. He exhibits no edema and no tenderness.  Neurological: He is alert and oriented to person, place, and time.  Skin: Skin is warm and dry. Rash (scaling rash on b/l forearms) noted.    ED Course  Procedures (including critical care time)  Labs Reviewed  CBC WITH DIFFERENTIAL - Abnormal; Notable for the following:    RBC 4.20 (*)     MCV 101.4 (*)     MCH 34.3 (*)     Platelets 56 (*)  PLATELET COUNT CONFIRMED BY SMEAR   Monocytes Relative 13 (*)     Monocytes Absolute 1.3 (*)     All other components within normal limits  COMPREHENSIVE METABOLIC PANEL - Abnormal; Notable for the following:    Glucose, Bld 173 (*)     BUN 29 (*)     Creatinine, Ser 1.53 (*)     AST 53 (*)     ALT 67 (*)     Alkaline Phosphatase 119 (*)     GFR calc non Af Amer 48 (*)     GFR calc Af Amer 55 (*)     All other components within normal limits  PRO B NATRIURETIC PEPTIDE - Abnormal; Notable for the following:    Pro B Natriuretic peptide (BNP) 2291.0 (*)     All other components within normal limits  PROTIME-INR - Abnormal; Notable for the following:    Prothrombin Time 21.4 (*)     INR 1.82 (*)     All other components within normal limits  CARDIAC PANEL(CRET KIN+CKTOT+MB+TROPI) - Abnormal; Notable for the following:    Total CK 235 (*)     All other components within normal limits  POCT I-STAT TROPONIN I  TECHNOLOGIST SMEAR REVIEW  CARDIAC PANEL(CRET KIN+CKTOT+MB+TROPI)  TSH  HEMOGLOBIN A1C  HEPATITIS PANEL, ACUTE  HIV ANTIBODY (ROUTINE TESTING)   Dg Chest Portable 1 View  04/30/2012  *RADIOLOGY REPORT*  Clinical Data: Chest pain  PORTABLE CHEST - 1 VIEW  Comparison:  June 01, 2011 and September 30, 2007  Findings: The cardiac silhouette, mediastinum, pulmonary vasculature are within normal limits.  Both lungs are clear.  There is stable, chronic elevation of the right hemi diaphragm.  The left costophrenic angle has been cut off the film. There is no acute bony abnormality.  IMPRESSION: There is no evidence of acute cardiac or pulmonary process.  Original Report Authenticated By: Brandon Melnick, M.D.     1. Orthopnea   2. Acute renal insufficiency   3. Atrial fibrillation   4. Chest pain   5. Cocaine abuse       MDM  61 yo male with PMHx of Afib, lower extremity clot s/p stenting who presents with 5 hour history of shortness of breath and cough.  Pt also had some central chest pressure made worse by coughing just prior to arrival.  He denies  chest pain at time of exam.  Shortness of breath and cough was worse when lying flat last night.  Reports orthopnea and PND.  Sx now improved.  AF, tachycardic to 108 with vital signs otherwise stable.  EKG: rate 108, irregular, no ST changes.  EKG consistent with Afib.  Getting labs including cardiac enzymes and INR, and CXR.  Will also give ASA.  Initial troponin negative.  BNP elevated at 2291.  CXR w/o acute process.  INR 1.82, subtherapeutic.  Will admit to Hospitalist service for further work-up for orthopnea.        Cherre Robins, MD 04/30/12 [phone]

## 2012-04-30 NOTE — Consult Note (Signed)
Reason for Consult: chest pain Referring Physician:   [name] is an 61 y.o. male.  HPI:   The patient is a 61 year old African American obese male with history of peripheral artery disease,  newly diagnosed atrial fibrillation, tobacco use, cocaine use, alcohol abuse, hypertension.  Was last seen by Dr. Alanda [name] and Dr. Jacinto [name] in January 2009 at which time he had peripheral vascular angiogram which revealed total occlusion of the right mid superficial femoral artery with collaterals and total occlusion of the distal infrageniculate popliteal and tibial peroneal on the right with good vessels collateralized RAT and RPT. He had high-grade focal left superficial femoral artery stenosis with three-vessel runoff mid left anterior tibial disease.  At that time he had successful PTA and balloon angioplasty to less than left SFA.  The patient presents to [facility] with shortness of breath complaints of chest pain. He states he notices chest pain with sleep when he lays flat and with exertion. He states it feels like "bone pressing on his chest" centrally. He states he had radiation to the left arm which he described as a numbness feeling in his hand he also complains of dyspnea on exertion, dry mouth, wheezing, dry cough, lightheadedness, nausea, vomiting and he says his stomach feels hard.  The patient drinks half a complaint of liquor per day so recent cocaine use and smokes 5-6 cigarettes per day.  He is constantly dozing off to sleep during our interview. His wife states that he constantly stops breathing when he sleeping in the evening.  He is currently chest pain-free.  EKG shows Afib RVR(108bpm) with ST depression in inferolateral leads.  Past Medical History  Diagnosis Date  . PAD (peripheral artery disease)   . Hypertension   . Cocaine abuse   . ETOH abuse   . Tobacco abuse     History reviewed. No pertinent past surgical history.  History reviewed. No pertinent family  history.  Social History:  reports that he has been smoking Cigarettes.  He has a 10 pack-year smoking history. He does not have any smokeless tobacco history on file. He reports that he drinks about 8 ounces of alcohol per week. He reports that he uses illicit drugs (Cocaine).  Allergies: No Known Allergies  Medications: alto, Lopressor 50 mg twice daily, prednisone  Results for orders placed during the hospital encounter of 04/30/12 (from the past 48 hour(s))  CBC WITH DIFFERENTIAL     Status: Abnormal   Collection Time   04/30/12  8:03 AM      Component Value Range Comment   WBC 10.0  4.0 - 10.5 K/uL    RBC 4.20 (*) 4.22 - 5.81 MIL/uL    Hemoglobin 14.4  13.0 - 17.0 g/dL    HCT 16.1  09.6 - 04.5 %    MCV 101.4 (*) 78.0 - 100.0 fL    MCH 34.3 (*) 26.0 - 34.0 pg    MCHC 33.8  30.0 - 36.0 g/dL    RDW 40.9  81.1 - 91.4 %    Platelets 56 (*) 150 - 400 K/uL PLATELET COUNT CONFIRMED BY SMEAR   Neutrophils Relative 73  43 - 77 %    Neutro Abs 7.3  1.7 - 7.7 K/uL    Lymphocytes Relative 13  12 - 46 %    Lymphs Abs 1.3  0.7 - 4.0 K/uL    Monocytes Relative 13 (*) 3 - 12 %    Monocytes Absolute 1.3 (*) 0.1 - 1.0 K/uL  Eosinophils Relative 1  0 - 5 %    Eosinophils Absolute 0.1  0.0 - 0.7 K/uL    Basophils Relative 0  0 - 1 %    Basophils Absolute 0.0  0.0 - 0.1 K/uL   COMPREHENSIVE METABOLIC PANEL     Status: Abnormal   Collection Time   04/30/12  8:03 AM      Component Value Range Comment   Sodium 139  135 - 145 mEq/L    Potassium 4.1  3.5 - 5.1 mEq/L    Chloride 103  96 - 112 mEq/L    CO2 26  19 - 32 mEq/L    Glucose, Bld 173 (*) 70 - 99 mg/dL    BUN 29 (*) 6 - 23 mg/dL    Creatinine, Ser 6.04 (*) 0.50 - 1.35 mg/dL    Calcium 8.9  8.4 - 54.0 mg/dL    Total Protein 6.9  6.0 - 8.3 g/dL    Albumin 3.7  3.5 - 5.2 g/dL    AST 53 (*) 0 - 37 U/L    ALT 67 (*) 0 - 53 U/L    Alkaline Phosphatase 119 (*) 39 - 117 U/L    Total Bilirubin 0.8  0.3 - 1.2 mg/dL    GFR calc non Af Amer 48  (*) >90 mL/min    GFR calc Af Amer 55 (*) >90 mL/min   PRO B NATRIURETIC PEPTIDE     Status: Abnormal   Collection Time   04/30/12  8:04 AM      Component Value Range Comment   Pro B Natriuretic peptide (BNP) 2291.0 (*) 0 - 125 pg/mL   POCT I-STAT TROPONIN I     Status: Normal   Collection Time   04/30/12  8:12 AM      Component Value Range Comment   Troponin i, poc 0.02  0.00 - 0.08 ng/mL    Comment 3            PROTIME-INR     Status: Abnormal   Collection Time   04/30/12  8:47 AM      Component Value Range Comment   Prothrombin Time 21.4 (*) 11.6 - 15.2 seconds    INR 1.82 (*) 0.00 - 1.49     Dg Chest Portable 1 View  04/30/2012  *RADIOLOGY REPORT*  Clinical Data: Chest pain  PORTABLE CHEST - 1 VIEW  Comparison: June 01, 2011 and September 30, 2007  Findings: The cardiac silhouette, mediastinum, pulmonary vasculature are within normal limits.  Both lungs are clear.  There is stable, chronic elevation of the right hemi diaphragm.  The left costophrenic angle has been cut off the film. There is no acute bony abnormality.  IMPRESSION: There is no evidence of acute cardiac or pulmonary process.  Original Report Authenticated By: Kevin [name], M.D.    Review of Systems  Constitutional: Negative for fever and diaphoresis.  HENT: Negative for congestion and sore throat.   Eyes: Positive for double vision. Negative for blurred vision.  Respiratory: Positive for cough, shortness of breath and wheezing.   Cardiovascular: Positive for chest pain, orthopnea, claudication and PND. Negative for palpitations and leg swelling.  Gastrointestinal: Positive for nausea and vomiting. Negative for abdominal pain, diarrhea, constipation, blood in stool and melena.  Genitourinary: Negative for dysuria and hematuria.  Musculoskeletal: Positive for myalgias (left arm).  Neurological: Positive for dizziness.   Blood pressure 152/111, pulse 104, temperature 98.2 F (36.8 C), temperature source Oral, resp.  rate  18, height 6' (1.829 m), weight 102.059 kg (225 lb), SpO2 22.00%. Physical Exam  Constitutional: He is oriented to person, place, and time. No distress.       Obese, deconditioned male  HENT:  Head: Normocephalic and atraumatic.  Mouth/Throat: Oropharynx is clear and moist. No oropharyngeal exudate.  Eyes: EOM are normal. Pupils are equal, round, and reactive to light. No scleral icterus.  Neck: Normal range of motion. Neck supple.       JVD difficult to determine.  Thick neck.  Cardiovascular: Normal rate, S1 normal and S2 normal.  An irregularly irregular rhythm present.  No murmur heard. Pulses:      Radial pulses are 2+ on the right side, and 2+ on the left side.       Dorsalis pedis pulses are 1+ on the right side, and 2+ on the left side.       Posterior tibial pulses are 2+ on the right side, and 0 on the left side.       No Carotid bruit.  Respiratory: Effort normal. He has no wheezes. He has no rales.       Breath sounds are decreased  GI: Soft. Bowel sounds are normal. He exhibits distension. There is no tenderness.  Musculoskeletal: He exhibits no edema.  Lymphadenopathy:    He has no cervical adenopathy.  Neurological: He is alert and oriented to person, place, and time. He exhibits normal muscle tone.  Skin: Skin is warm and dry.  Psychiatric: He has a normal mood and affect.    Assessment/Plan: Patient Active Hospital Problem List: Chest pain (04/30/2012) Atrial fibrillation (04/30/2012) Cocaine abuse (04/30/2012) HTN (hypertension) (04/30/2012) ARI Elevated LFTs PAD (peripheral artery disease) (04/30/2012) SOB (shortness of breath) (04/30/2012) Tobacco abuse (04/30/2012) ETOH abuse (04/30/2012)  Plan:    1. Cycle Cardiac markers, check lipids, A1C, TSH.   BMET in AM  2. DC Xarelto and start heparin per pharmacy consult for Afib/ACS.  CHADS-VASc of 2.   3. Left heart cath and possible PCI.   4. 2D Echo   5. No beta blockers due to cocaine abuse.  Will add hydralazine  and diltiazem initially for after load reduction/vasospasm and HTN.  Hold ACE/ARB due to    increase SCr.  6. Repeat EKG in AM   7. Agree with lasix, however, he does not appear overtly volume overloaded.  May be poor output secondary to Afib/OSA.  Monitor SCr.  8. Needs CPAP  9. Lower extremity arterial dopplers. Can be as out-patient.  10. Out-patient sleep study  11. Consider TEE/DCCV    Kevin [name] 04/30/2012, 12:01 PM

## 2012-04-30 NOTE — Progress Notes (Addendum)
Disposition Note.  Kevin [name], is a 61 y.o. male,   MRN: [id number]  -  DOB - [birth year]  Outpatient Primary MD for the patient is DEFAULT,PROVIDER, MD   Blood pressure 147/110, pulse 108, temperature 98.2 F (36.8 C), temperature source Oral, resp. rate 18, height 6\' 8"  (2.032 m), weight 102.059 kg (225 lb), SpO2 94.00%.  Active Problems:  Chest pain  SOB (shortness of breath)  Atrial fibrillation  PAD (peripheral artery disease)  I have not seen this patient.  61 yo male with history of afib, stent placement for clot in RLE.  He has been off medication for the past two years but restarted meds on Tuesday (Dr. Nils Pyle).  There is a question of whether or not he is on Xeralto.  Today he presents with SOB, PND, Orthopnea along with chest pain worse with coughing.  BNP 2200+  EKG looks as though it has ST depression.    I have reviewed the case with Arlys John, [place] from [facility].  He is concerned for demand ischemia and is going to the ED to consult on the patient.  As the patient is currently chest pain free I have requested a cardiac tele bed.  Algis Downs, PA-C [facility] Pager: [phone]

## 2012-04-30 NOTE — ED Provider Notes (Signed)
I saw and evaluated the patient, reviewed the resident's note and I agree with the findings and plan.  Pt with chest pressure that awoke him from sleep. Also with chronic afib and not on anticoags, will admit for management of afib and chest pain eval  Toy Baker, MD 04/30/12 0900

## 2012-05-01 ENCOUNTER — Encounter (HOSPITAL_COMMUNITY)
Admission: EM | Disposition: A | Discharge status: Home / Self Care | Payer: Self-pay | Source: Home / Self Care | Attending: Cardiology

## 2012-05-01 ENCOUNTER — Encounter (HOSPITAL_COMMUNITY): Payer: Self-pay | Admitting: Cardiology

## 2012-05-01 DIAGNOSIS — K8020 Calculus of gallbladder without cholecystitis without obstruction: Secondary | ICD-10-CM

## 2012-05-01 HISTORY — PX: LEFT HEART CATHETERIZATION WITH CORONARY ANGIOGRAM: SHX5451

## 2012-05-01 HISTORY — DX: Calculus of gallbladder without cholecystitis without obstruction: K80.20

## 2012-05-01 LAB — COMPREHENSIVE METABOLIC PANEL
ALT: 58 U/L — ABNORMAL HIGH (ref 0–53)
AST: 42 U/L — ABNORMAL HIGH (ref 0–37)
Albumin: 3.5 g/dL (ref 3.5–5.2)
Alkaline Phosphatase: 116 U/L (ref 39–117)
BUN: 22 mg/dL (ref 6–23)
CO2: 22 mEq/L (ref 19–32)
Calcium: 8.5 mg/dL (ref 8.4–10.5)
Chloride: 105 mEq/L (ref 96–112)
Creatinine, Ser: 1.13 mg/dL (ref 0.50–1.35)
GFR calc Af Amer: 80 mL/min — ABNORMAL LOW (ref >90)
GFR calc non Af Amer: 69 mL/min — ABNORMAL LOW (ref >90)
Glucose, Bld: 95 mg/dL (ref 70–99)
Potassium: 3.9 mEq/L (ref 3.5–5.1)
Sodium: 140 mEq/L (ref 135–145)
Total Bilirubin: 0.8 mg/dL (ref 0.3–1.2)
Total Protein: 6.7 g/dL (ref 6.0–8.3)

## 2012-05-01 LAB — CBC
HCT: 41.6 % (ref 39.0–52.0)
Hemoglobin: 14.1 g/dL (ref 13.0–17.0)
MCH: 34.1 pg — ABNORMAL HIGH (ref 26.0–34.0)
MCHC: 33.9 g/dL (ref 30.0–36.0)
MCV: 100.7 fL — ABNORMAL HIGH (ref 78.0–100.0)
Platelets: 52 10*3/uL — ABNORMAL LOW (ref 150–400)
RBC: 4.13 MIL/uL — ABNORMAL LOW (ref 4.22–5.81)
RDW: 12.4 % (ref 11.5–15.5)
WBC: 8.1 10*3/uL (ref 4.0–10.5)

## 2012-05-01 LAB — PROTIME-INR
INR: 1.48 (ref 0.00–1.49)
Prothrombin Time: 18.2 seconds — ABNORMAL HIGH (ref 11.6–15.2)

## 2012-05-01 LAB — APTT: aPTT: 54 seconds — ABNORMAL HIGH (ref 24–37)

## 2012-05-01 LAB — HEPATITIS PANEL, ACUTE
HCV Ab: REACTIVE — AB
Hep A IgM: NEGATIVE
Hep B C IgM: NEGATIVE
Hepatitis B Surface Ag: NEGATIVE

## 2012-05-01 LAB — HEMOGLOBIN A1C
Hgb A1c MFr Bld: 5.4 % (ref <5.7)
Mean Plasma Glucose: 108 mg/dL (ref <117)

## 2012-05-01 LAB — LIPID PANEL
Cholesterol: 136 mg/dL (ref 0–200)
HDL: 58 mg/dL (ref >39)
LDL Cholesterol: 58 mg/dL (ref 0–99)
Total CHOL/HDL Ratio: 2.3 RATIO
Triglycerides: 100 mg/dL (ref <150)
VLDL: 20 mg/dL (ref 0–40)

## 2012-05-01 LAB — HEPARIN LEVEL (UNFRACTIONATED)
Heparin Unfractionated: >2 IU/mL — ABNORMAL HIGH (ref 0.30–0.70)
Heparin Unfractionated: 1.89 IU/mL — ABNORMAL HIGH (ref 0.30–0.70)

## 2012-05-01 LAB — HIV ANTIBODY (ROUTINE TESTING W REFLEX): HIV: NONREACTIVE

## 2012-05-01 SURGERY — LEFT HEART CATHETERIZATION WITH CORONARY ANGIOGRAM
Anesthesia: LOCAL

## 2012-05-01 MED ORDER — FUROSEMIDE 10 MG/ML IJ SOLN
INTRAMUSCULAR | Status: AC
  Administered 2012-05-02: 20 mg via INTRAVENOUS
  Filled 2012-05-01: qty 4

## 2012-05-01 MED ORDER — HEPARIN (PORCINE) IN NACL 2-0.9 UNIT/ML-% IJ SOLN
INTRAMUSCULAR | Status: AC
  Filled 2012-05-01: qty 2000

## 2012-05-01 MED ORDER — FENTANYL CITRATE 0.05 MG/ML IJ SOLN
INTRAMUSCULAR | Status: AC
  Filled 2012-05-01: qty 2

## 2012-05-01 MED ORDER — DILTIAZEM HCL 100 MG IV SOLR
5.0000 mg/h | INTRAVENOUS | Status: DC
  Administered 2012-05-01: 10 mg/h via INTRAVENOUS
  Filled 2012-05-01 (×2): qty 100

## 2012-05-01 MED ORDER — LIDOCAINE HCL (PF) 1 % IJ SOLN
INTRAMUSCULAR | Status: AC
  Filled 2012-05-01: qty 30

## 2012-05-01 MED ORDER — SODIUM CHLORIDE 0.9 % IJ SOLN: 3.0000 mL | INTRAMUSCULAR | Status: DC | PRN

## 2012-05-01 MED ORDER — HEPARIN SODIUM (PORCINE) 1000 UNIT/ML IJ SOLN
INTRAMUSCULAR | Status: AC
  Filled 2012-05-01: qty 1

## 2012-05-01 MED ORDER — SODIUM CHLORIDE 0.9 % IJ SOLN
3.0000 mL | Freq: Two times a day (BID) | INTRAMUSCULAR | Status: DC
  Administered 2012-05-01 – 2012-05-06 (×6): 3 mL via INTRAVENOUS

## 2012-05-01 MED ORDER — SODIUM CHLORIDE 0.9 % IV SOLN: 250.0000 mL | INTRAVENOUS | Status: DC | PRN

## 2012-05-01 MED ORDER — SODIUM CHLORIDE 0.9 % IV SOLN
INTRAVENOUS | Status: DC
  Administered 2012-05-02: 50 mL/h via INTRAVENOUS

## 2012-05-01 MED ORDER — MIDAZOLAM HCL 2 MG/2ML IJ SOLN
INTRAMUSCULAR | Status: AC
  Filled 2012-05-01: qty 2

## 2012-05-01 MED ORDER — DILTIAZEM HCL 90 MG PO TABS
90.0000 mg | ORAL_TABLET | Freq: Three times a day (TID) | ORAL | Status: DC
  Filled 2012-05-01 (×2): qty 1

## 2012-05-01 MED ORDER — ACETAMINOPHEN 325 MG PO TABS
650.0000 mg | ORAL_TABLET | ORAL | Status: DC | PRN
  Administered 2012-05-03 (×2): 650 mg via ORAL
  Filled 2012-05-01 (×2): qty 2

## 2012-05-01 MED ORDER — FUROSEMIDE 10 MG/ML IJ SOLN
20.0000 mg | Freq: Two times a day (BID) | INTRAMUSCULAR | Status: DC
  Administered 2012-05-01 – 2012-05-03 (×4): 20 mg via INTRAVENOUS
  Filled 2012-05-01 (×6): qty 2

## 2012-05-01 MED ORDER — DILTIAZEM HCL 90 MG PO TABS
90.0000 mg | ORAL_TABLET | Freq: Three times a day (TID) | ORAL | Status: DC
  Administered 2012-05-01 – 2012-05-03 (×5): 90 mg via ORAL
  Filled 2012-05-01 (×9): qty 1

## 2012-05-01 MED ORDER — NITROGLYCERIN 0.2 MG/ML ON CALL CATH LAB
INTRAVENOUS | Status: AC
  Filled 2012-05-01: qty 1

## 2012-05-01 MED ORDER — HYDRALAZINE HCL 25 MG PO TABS
37.5000 mg | ORAL_TABLET | Freq: Three times a day (TID) | ORAL | Status: DC
  Administered 2012-05-01 – 2012-05-03 (×6): 37.5 mg via ORAL
  Filled 2012-05-01 (×10): qty 1.5

## 2012-05-01 MED ORDER — VERAPAMIL HCL 2.5 MG/ML IV SOLN
INTRAVENOUS | Status: AC
  Filled 2012-05-01: qty 2

## 2012-05-01 MED ORDER — HEPARIN (PORCINE) IN NACL 100-0.45 UNIT/ML-% IJ SOLN
1500.0000 [IU]/h | INTRAMUSCULAR | Status: DC
  Administered 2012-05-01: 1300 [IU]/h via INTRAVENOUS
  Administered 2012-05-02: 1500 [IU]/h via INTRAVENOUS
  Administered 2012-05-02: 1400 [IU]/h via INTRAVENOUS
  Administered 2012-05-03 – 2012-05-06 (×5): 1500 [IU]/h via INTRAVENOUS
  Filled 2012-05-01 (×10): qty 250

## 2012-05-01 NOTE — Progress Notes (Signed)
ANTICOAGULATION CONSULT NOTE - Follow Up  Pharmacy Consult for IV heparin (Xarelto on hold) Indication: chest pain/ACS, afib  No Known Allergies  Patient Measurements: Height: 6\' 6"  (198.1 cm) Weight: 234 lb 5.6 oz (106.3 kg) IBW/kg (Calculated) : 91.4  Heparin Dosing Weight: 106.3 kg  Vital Signs: Temp: 98.2 F (36.8 C) (08/09 0951) Temp src: Oral (08/09 0951) BP: 164/94 mmHg (08/09 0951) Pulse Rate: 105  (08/09 1304)  Labs:  Basename 05/01/12 1049 05/01/12 0625 04/30/12 2248 04/30/12 2220 04/30/12 2000 04/30/12 1303 04/30/12 0847 04/30/12 0803  HGB -- 14.1 -- -- -- -- -- 14.4  HCT -- 41.6 -- -- -- -- -- 42.6  PLT -- 52* -- -- -- -- -- 56*  APTT -- 54* 69* -- -- -- -- --  LABPROT 18.2* -- -- -- -- -- 21.4* --  INR 1.48 -- -- -- -- -- 1.82* --  HEPARINUNFRC -- 1.89* -- -- >2.00* -- -- --  CREATININE -- 1.13 -- -- -- -- -- 1.53*  CKTOTAL -- -- -- 169 -- 235* -- --  CKMB -- -- -- 3.0 -- 3.8 -- --  TROPONINI -- -- -- <0.30 -- <0.30 -- --    Estimated Creatinine Clearance: 89.9 ml/min (by C-G formula based on Cr of 1.13).  Assessment: 61 yo male on chronic Xarelto for afib (newly diagnosed) admitted with chest pain.  Also with hx cocaine use.  Pharmacy asked to continue heparin dosing 6h post-cath/sheath removal. Noted that he was on Xarelto pta. Last Xarelto dose on 8/7 ~ 2 PM per patient's wife.  Of note, baseline platelet count is 56. Heparin levels can appear falsely elevated d/t interaction of residual rivaroxaban, therefore we have been monitoring heparin dosing with aPTT. APTT (69 seconds) was at-goal on 1300 units/hr.  Noted pt had radial cath today, and bad was off at 1615. Per RN, access site is stable, not currently bleeding.  Goal of Therapy:  Heparin level 0.3-0.7 units/ml Monitor platelets by anticoagulation protocol: Yes aPTT 50 - 85 seconds   Plan:  - No Heparin bolus - At 2200 today, start IV heparin infusion at 1300 units/hr - At 0400 check heparin  level, CBC and PTT - Will check daily heparin levels/PTT and CBC (anticipate Xarelto effect on antiXa levels to wear off soon).  Kamisha Ell K. Allena Katz, PharmD, BCPS.  Clinical Pharmacist Pager [phone]. 05/01/2012 4:49 PM

## 2012-05-01 NOTE — Progress Notes (Signed)
ANTICOAGULATION CONSULT NOTE - Follow Up Consult  Pharmacy Consult for Heparin Indication: atrial fibrillation  No Known Allergies  Patient Measurements: Height: 6\' 6"  (198.1 cm) Weight: 234 lb 5.6 oz (106.3 kg) IBW/kg (Calculated) : 91.4  Heparin Dosing Weight:   Vital Signs: Temp: 97.6 F (36.4 C) (08/09 0304) Temp src: Oral (08/09 0304) BP: 176/113 mmHg (08/09 0513) Pulse Rate: 86  (08/09 0513)  Labs:  Alvira Philips 05/01/12 4098 04/30/12 2248 04/30/12 2220 04/30/12 2000 04/30/12 1303 04/30/12 0847 04/30/12 0803  HGB 14.1 -- -- -- -- -- 14.4  HCT 41.6 -- -- -- -- -- 42.6  PLT 52* -- -- -- -- -- 56*  APTT 54* 69* -- -- -- -- --  LABPROT -- -- -- -- -- 21.4* --  INR -- -- -- -- -- 1.82* --  HEPARINUNFRC 1.89* -- -- >2.00* -- -- --  CREATININE -- -- -- -- -- -- 1.53*  CKTOTAL -- -- 169 -- 235* -- --  CKMB -- -- 3.0 -- 3.8 -- --  TROPONINI -- -- <0.30 -- <0.30 -- --    Estimated Creatinine Clearance: 66.4 ml/min (by C-G formula based on Cr of 1.53).   Medications:  Scheduled:    . aspirin  324 mg Oral Once  . aspirin  324 mg Oral Pre-Cath  . diltiazem  60 mg Oral Q8H  . docusate sodium  100 mg Oral BID  . folic acid  1 mg Oral Daily  . furosemide  20 mg Intravenous Once  . hydrALAZINE  25 mg Oral Q8H  . hydrocortisone cream  1 application Topical TID  . multivitamin with minerals  1 tablet Oral Daily  . predniSONE  20 mg Oral Daily  . sodium chloride  3 mL Intravenous Q12H  . sodium chloride  3 mL Intravenous Q12H  . sodium chloride  3 mL Intravenous Q12H  . thiamine  100 mg Oral Daily  . DISCONTD: metoprolol  50 mg Oral BID  . DISCONTD: rivaroxaban  10 mg Oral Daily    Assessment: 61yo male with AFib, on Xarelto pta with residual effect reflected in elevated Heparin level.  HL = 1.89, PTT = 54, pltc = 52 (baseline 56, MD aware).  Hg is stable, no bleeding problems noted.  Xarelto effect is decreasing as Heparin level is now < 2.  PTT is within goal range of  50-85.  Goal of Therapy:  Heparin level 0.3-0.7 units/ml, PTT 50-85 sec Monitor platelets by anticoagulation protocol: Yes   Plan:  1.  Watch platelet count closely. 2.  Increase Heparin to 1350 units/hr 3.  Continue daily PTT until Xarelto effect on Heparin level is resolved. 4.  F/U in AM  Marisue Humble, PharmD Clinical Pharmacist [facility]

## 2012-05-01 NOTE — CV Procedure (Signed)
[facility] PERCUTANEOUS CORONARY INTERVENTION REPORT  NAME:  DURELL LOFASO   MRN: [id number] DOB:  1951/05/27   ADMIT DATE: 04/30/2012  INTERVENTIONAL CARDIOLOGIST: Marykay Lex, M.D., MS PRIMARY CARE PROVIDER: Jackie Plum, MD PRIMARY CARDIOLOGIST:   PATIENT:  Kevin [name] is a 61 y.o. male with a past medical history significant for peripheral artery disease, alcohol cocaine use as well as current tobacco use with portal hypertension was recently diagnosed with atrial fibrillation. He was started on Xarelto by his PCP. He was in the most the emergency room in August 8 with chest discomfort and shortness of breath. He woke up coughing and short of breath and chest discomfort the DDD cocaine the previous date prior to admission. No PND, orthopnea, edema.  Given his extensive PVD history as a cardiac risk equivalent with new onset atrial fibrillation, who was referred for cardiac catheterization by Dr. Rennis Golden was on consultation.  He does have thrombocytopenia and therefore the plan was for diagnostic catheterization only.  PRE-OPERATIVE DIAGNOSIS:    New onset atrial fibrillation  Chest pain with shortness of breath at rest  PROCEDURES PERFORMED:    Left heart catheterization wth Coronary Angiography via 6 French Right Radial Artery Access  Left ventriculography not performed due to elevate EDP.  PROCEDURE: Consent: The procedure with Risks/Benefits/Alternatives and Indications was reviewed with the patient (and family).  All questions were answered.    Risks / Complications include, but not limited to: Death, MI, CVA/TIA, VF/VT (with defibrillation), Bradycardia (need for temporary pacer placement), contrast induced nephropathy, bleeding / bruising / hematoma / pseudoaneurysm, vascular or coronary injury (with possible emergent CT or Vascular Surgery), adverse medication reactions, infection.    The patient (and family) voice understanding and  agree to proceed.    Risks of procedure as well as the alternatives and risks of each were explained to the (patient/caregiver).  Consent for procedure obtained. Consent for signed by MD and patient with RN witness -- placed on chart.  PROCEDURE: The patient was brought to the 2nd Floor [facility] in the fasting state and prepped and draped in the usual sterile fashion for Right radial or groin access.  A modified Allen's test using plethysmography was performed both for ulnar and radial flow demonstrating adequate ulnar collateral flow. Sterile technique was used including antiseptics, cap, gloves, gown, hand hygiene, mask and sheet.  Skin prep: Chlorhexidine;  Time Out: Verified patient identification, verified procedure, site/side was marked, verified correct patient position, special equipment/implants available, medications/allergies/relevent history reviewed, required imaging and test results available.  Performed  Access: Right Radial Artery Artery; 6 Fr Sheath Diagnostic:  TIG 4.0 - advanced over Versicore wire used to engage left and right coronary arteries for left and right coronary arteries for cineangiography.  Exchanged over long exchange safety J- wire for Angled Pigtail used to cross the aortic valve for the LV hemodynamics. LV gram not performed due to elevate EDP.  Left and Right Coronary Artery Angiography: 5 Fr TIG 4.0 LV Hemodynamics (LV Gram): 5 Fr  Angled Pigtail  Hemodynamics:  Central Aortic / Mean Pressures:  186/124 mmHg, (peak systolic blood pressures of 210 record);  152 mmHg  Left Ventricular Pressures / EDP:  186/34 mmHg;  range of 35-55 mmHg  Left Ventriculography: not performed  Coronary Anatomy:  Left Main:  Large caliber vessel trifurcates into LAD, Ramus Intermedius, Left Circumflex. Angiographically normal LAD: Large caliber vessel gives rise to 2 relatively proximal diagonal branches, one large  septal perforator and several  small septal perforator. The distal vessel wraps around the apex perfusing the inferoapex.  D1: Small-caliber vessel, mild luminal irregularities.  D2: Small to Moderate caliber vessel that comes off at the same site as a large septal perforator trunk. Minimal luminal irregularities Left Circumflex: Large caliber vessel that bifurcates proximally into a OM1 and AV groove circumflex that branches distally into a OM 2/LPL 1 and another left posterior lateral branch as the extension/, branch of the AV groove circumflex. Minimal luminal irregularities.  OM1: Moderate to large caliber vessel bifurcates distally reaching almost to the inferoapex; minimal luminal irregularities  OM 2: Small-caliber vessel again bifurcates distally, diffuse luminal irregularities. Ramus intermedius: Large caliber vessel coursing obtuse marginal distribution to the center the left ventricle it also bifurcates distally after giving off several other small branches. Minimal luminal irregularities.   RCA: Large caliber dominant vessel gives off a proximal SA nodal artery and 1 major artery marginal branch. The vessel then bifurcates distally into a double barrel RPDA and RPL system.  RPDA: 2 tandem vessel there small moderate caliber, there are mild "ostial "stenoses in both these vessels but otherwise minimal luminal irregularities  RPL Sysytem:The Right Posterior AV Groove Branch gives rise to 2 the AV nodal artery and 2 small-caliber posterior lateral branches. Minimal luminal irregularities  TR Band:  16 Hours, 1405 mL air  ANESTHESIA:   Local Lidocaine 2 ml SEDATION:  1 mg IV Versed, 25 mcg IV fentanyl ; Premedication: 5 mg PO Valium MEDICATIONS: IV diltiazem 10 mg; IV Lasix 40 mg  Anticoagulation: 5000 Units IV Heparin  Radial Cocktail: 5 mg Verapamil, 400 mcg NTG, 2 ml 2% Lidocaine in 10 ml NS  EBL:   < 10 ml  PATIENT DISPOSITION:    The patient was transferred to the PACU holding area in a hemodynamicaly  stable, chest pain free condition.  The patient tolerated the procedure well, and there were no complications.  The patient was stable before, during, and after the procedure.  POST-OPERATIVE DIAGNOSIS:    Essentially angiographically normal coronary arteries.  Severe systemic hypertension with significantly elevated LVEDP the range of 35-55 mmHg.  Left ejection fraction not assessed as LV gram not performed due to elevate EDP in A. fib.  PLAN OF CARE:  Standard post-radial care  Continue IV Lasix for diuresis and aggressive blood pressure control. An increased diltiazem dose as well as hydralazine dose. Not using beta blocker due to concern of recent cocaine use. Switch to an ACE inhibitor once renal function stabilized following catheterization.  Await results of echocardiogram to assess ejection fraction and diastolic function/valvular disease.   Results for Dr. Carmell Austria and Dr. Olivia Canter, M.D., M.S. [facility] [address] [place], [place]  [address]  [phone]  05/01/2012 5:49 PM

## 2012-05-01 NOTE — Progress Notes (Signed)
Subjective: Patient complaining of SOB, started again this morning.   Objective: Filed Vitals:   05/01/12 0304 05/01/12 0513 05/01/12 0951 05/01/12 0954  BP: 166/98 176/113 164/94   Pulse: 88 86 74   Temp: 97.6 F (36.4 C)  98.2 F (36.8 C)   TempSrc: Oral  Oral   Resp: 18 20 20    Height:      Weight:      SpO2: 99%  95% 100%   Weight change:    General: Alert, awake, oriented x3, in no acute distress.  HEENT: No bruits, no goiter.  Heart: Regular rate and rhythm, without murmurs, rubs, gallops.  Lungs: CTA bilateral air movement.  Abdomen: Soft, nontender, nondistended, positive bowel sounds.  Neuro: Grossly intact, nonfocal. Extremities; no edema.   Lab Results:  [facility] 05/01/12 0625 04/30/12 0803  NA 140 139  K 3.9 4.1  CL 105 103  CO2 22 26  GLUCOSE 95 173*  BUN 22 29*  CREATININE 1.13 1.53*  CALCIUM 8.5 8.9  MG -- --  PHOS -- --    Basename 05/01/12 0625 04/30/12 0803  AST 42* 53*  ALT 58* 67*  ALKPHOS 116 119*  BILITOT 0.8 0.8  PROT 6.7 6.9  ALBUMIN 3.5 3.7    Basename 05/01/12 0625 04/30/12 0803  WBC 8.1 10.0  NEUTROABS -- 7.3  HGB 14.1 14.4  HCT 41.6 42.6  MCV 100.7* 101.4*  PLT 52* 56*    Basename 04/30/12 2220 04/30/12 1303  CKTOTAL 169 235*  CKMB 3.0 3.8  CKMBINDEX -- --  TROPONINI <0.30 <0.30    Basename 04/30/12 1303  HGBA1C 5.4    Basename 05/01/12 0625  CHOL 136  HDL 58  LDLCALC 58  TRIG 100  CHOLHDL 2.3  LDLDIRECT --    Basename 04/30/12 1303  TSH 1.773  T4TOTAL --  T3FREE --  THYROIDAB --   No results found for this basename: VITAMINB12:2,FOLATE:2,FERRITIN:2,TIBC:2,IRON:2,RETICCTPCT:2 in the last 72 hours  Micro Results: No results found for this or any previous visit (from the past 240 hour(s)).  Studies/Results: US Abdomen Complete  04/30/2012  *RADIOLOGY REPORT*  Clinical Data:  Increased liver function studies.  Query cirrhosis.  COMPLETE ABDOMINAL ULTRASOUND  Comparison:  None.  Findings:   Gallbladder:  Echogenic shadowing stones in the gallbladder, with stones measured at 1.7 and 1.6 cm diameter.  The gallbladder is contracted with moderate wall thickening of 6 mm.  The Murphy's sign is negative.  Changes are nonspecific but could be associated with acute cholecystitis in the appropriate clinical setting.  Common bile duct:  Normal caliber with measured diameter of 4 mm.  Liver:  No focal lesion identified.  Diffuse coarsening of liver echotexture which is nonspecific but could be associated with cirrhosis.  IVC:  Appears normal.  Pancreas:  Incomplete visualization of the pancreas.  Visualized portions of the head and body appear unremarkable.  Spleen:  Spleen length measures 4.4 cm.  Normal homogeneous parenchymal echotexture.  Right Kidney:  Right kidney measures 8.5 cm length.  No hydronephrosis.  Mild parenchymal atrophy.  Left Kidney:  Left kidney measures 9.8 cm length.  No hydronephrosis.  Mild parenchymal atrophy.  Abdominal aorta:  Distal aorta is not visualized.  Visualized proximal aorta is normal in caliber.  IMPRESSION: Echogenic stones in the gallbladder with gallbladder contraction and mild wall thickening.  This is a nonspecific appearance but could be associated with cholecystitis in the appropriate clinical setting.  Bilateral renal parenchymal atrophy.  Coarsening of liver parenchymal echotexture  which is nonspecific but could be associated with cirrhosis.  Original Report Authenticated By: Marlon Pel, M.D.   Dg Chest Portable 1 View  04/30/2012  *RADIOLOGY REPORT*  Clinical Data: Chest pain  PORTABLE CHEST - 1 VIEW  Comparison: June 01, 2011 and September 30, 2007  Findings: The cardiac silhouette, mediastinum, pulmonary vasculature are within normal limits.  Both lungs are clear.  There is stable, chronic elevation of the right hemi diaphragm.  The left costophrenic angle has been cut off the film. There is no acute bony abnormality.  IMPRESSION: There is no evidence  of acute cardiac or pulmonary process.  Original Report Authenticated By: Brandon Melnick, M.D.    Medications: I have reviewed the patient's current medications.  1- Chest pain: concern for angina, ACS in patient with multiple risk factors, current smoker, cocaine use, PVD, HTN.  No BB due to cocaine use. Nitroglycerine and morphine for pain as needed. Chest pain probably setting cocaine use, A fib. Cardiology consult appreciated. LDL 58, TSH 1.7, HB-A1c 5.4 .   2 SOB (shortness of breath); Multifactorial A fib, component HF, ACS. Receive 1 dose lasix. Controlled A fib. Decrease IV fluids. Add oxygen.   3-Atrial fibrillation: in setting Cocaine use, alcohol use, Cycle cardiac enzymes. Cardizem started and heparin. Need to be cautious with heparin in setting thrombocytopenia.   4-PAD (peripheral artery disease): Doppler outpatient.   5- Cocaine abuse: Counseling provide.   6-HTN (hypertension): Continue with Hydralazine.   7-Tobacco abuse: Counseling provide.   8-ETOH abuse: Monitor on CIWA protocol. Thiamine and folate.   9- Elevated LFT: Probably related to alcohol use.  Hepatitis panel pending, HIV non reactive, RUQ [place]: Echogenic stones in the gallbladder with gallbladder contraction and mild wall thickening. This is a nonspecific appearance but could be associated with cholecystitis in the appropriate clinical setting. Bilateral renal parenchymal atrophy. Coarsening of liver parenchymal echotexture which is nonspecific but could be associated with cirrhosis. Follow trend LFT. Doubt cholecystitis, LFT elevated probably related to alcohol use.   Marland Kitchen  10- Acute renal insufficiency: Probably decrease perfusion. Renal function improved. Avoid nephrotoxic.  11-Thrombocytopenia: Probably related to alcohol use. [place] with some finding could be  cirrhosis. Monitor closely on heparin for bleed.       LOS: 1 day   Jovonta Levit M.D.  [facility] 05/01/2012, 10:19 AM

## 2012-05-01 NOTE — Progress Notes (Signed)
Utilization Review Completed.  

## 2012-05-01 NOTE — Progress Notes (Signed)
Subjective: Complains of SOB, decreased IV fluids to 50/hr.  Objective: Vital signs in last 24 hours: Temp:  [97.6 F (36.4 C)-99 F (37.2 C)] 98.2 F (36.8 C) (08/09 0951) Pulse Rate:  [74-138] 74  (08/09 0951) Resp:  [16-20] 20  (08/09 0951) BP: (146-192)/(94-116) 164/94 mmHg (08/09 0951) SpO2:  [22 %-100 %] 100 % (08/09 0954) Weight:  [102.059 kg (225 lb)-106.3 kg (234 lb 5.6 oz)] 106.3 kg (234 lb 5.6 oz) (08/08 1249) Weight change:  Last BM Date: 04/29/12 Intake/Output from previous day: -174 08/08 0701 - 08/09 0700 In: 1625.4 [P.O.:750; I.V.:875.4] Out: 1800 [Urine:1800] Intake/Output this shift: Total I/O In: 0  Out: 200 [Urine:200]  PE: General:obviously SOB with talking.  Heart:S1S2 irreg irreg Lungs:diminished, without wheezes ZOX:WRUEA, soft, non tender + BS Ext:no edema Neuro:alert and oriented X 3, MAE, follows commands   Lab Results:  Basename 05/01/12 0625 04/30/12 0803  WBC 8.1 10.0  HGB 14.1 14.4  HCT 41.6 42.6  PLT 52* 56*   BMET  Basename 05/01/12 0625 04/30/12 0803  NA 140 139  K 3.9 4.1  CL 105 103  CO2 22 26  GLUCOSE 95 173*  BUN 22 29*  CREATININE 1.13 1.53*  CALCIUM 8.5 8.9    Basename 04/30/12 2220 04/30/12 1303  TROPONINI <0.30 <0.30    Lab Results  Component Value Date   CHOL 136 05/01/2012   HDL 58 05/01/2012   LDLCALC 58 05/01/2012   TRIG 100 05/01/2012   CHOLHDL 2.3 05/01/2012   Lab Results  Component Value Date   HGBA1C 5.4 04/30/2012     Lab Results  Component Value Date   TSH 1.773 04/30/2012    Hepatic Function Panel  Basename 05/01/12 0625  PROT 6.7  ALBUMIN 3.5  AST 42*  ALT 58*  ALKPHOS 116  BILITOT 0.8  BILIDIR --  IBILI --    Basename 05/01/12 0625  CHOL 136   No results found for this basename: PROTIME in the last 72 hours    EKG: Orders placed during the hospital encounter of 04/30/12  . ED EKG  . ED EKG  . EKG 12-LEAD  . EKG 12-LEAD  . EKG 12-LEAD  . EKG 12-LEAD  . EKG 12-LEAD  . EKG  12-LEAD    Studies/Results: US Abdomen Complete  04/30/2012  *RADIOLOGY REPORT*  Clinical Data:  Increased liver function studies.  Query cirrhosis.  COMPLETE ABDOMINAL ULTRASOUND  Comparison:  None.  Findings:  Gallbladder:  Echogenic shadowing stones in the gallbladder, with stones measured at 1.7 and 1.6 cm diameter.  The gallbladder is contracted with moderate wall thickening of 6 mm.  The Murphy's sign is negative.  Changes are nonspecific but could be associated with acute cholecystitis in the appropriate clinical setting.  Common bile duct:  Normal caliber with measured diameter of 4 mm.  Liver:  No focal lesion identified.  Diffuse coarsening of liver echotexture which is nonspecific but could be associated with cirrhosis.  IVC:  Appears normal.  Pancreas:  Incomplete visualization of the pancreas.  Visualized portions of the head and body appear unremarkable.  Spleen:  Spleen length measures 4.4 cm.  Normal homogeneous parenchymal echotexture.  Right Kidney:  Right kidney measures 8.5 cm length.  No hydronephrosis.  Mild parenchymal atrophy.  Left Kidney:  Left kidney measures 9.8 cm length.  No hydronephrosis.  Mild parenchymal atrophy.  Abdominal aorta:  Distal aorta is not visualized.  Visualized proximal aorta is normal in caliber.  IMPRESSION: Echogenic stones in  the gallbladder with gallbladder contraction and mild wall thickening.  This is a nonspecific appearance but could be associated with cholecystitis in the appropriate clinical setting.  Bilateral renal parenchymal atrophy.  Coarsening of liver parenchymal echotexture which is nonspecific but could be associated with cirrhosis.  Original Report Authenticated By: Marlon Pel, M.D.   Dg Chest Portable 1 View  04/30/2012  *RADIOLOGY REPORT*  Clinical Data: Chest pain  PORTABLE CHEST - 1 VIEW  Comparison: June 01, 2011 and September 30, 2007  Findings: The cardiac silhouette, mediastinum, pulmonary vasculature are within normal limits.   Both lungs are clear.  There is stable, chronic elevation of the right hemi diaphragm.  The left costophrenic angle has been cut off the film. There is no acute bony abnormality.  IMPRESSION: There is no evidence of acute cardiac or pulmonary process.  Original Report Authenticated By: Brandon Melnick, M.D.    Medications: I have reviewed the patient's current medications.    Marland Kitchen aspirin  324 mg Oral Pre-Cath  . diltiazem  60 mg Oral Q8H  . docusate sodium  100 mg Oral BID  . folic acid  1 mg Oral Daily  . furosemide  20 mg Intravenous Once  . hydrALAZINE  25 mg Oral Q8H  . hydrocortisone cream  1 application Topical TID  . multivitamin with minerals  1 tablet Oral Daily  . predniSONE  20 mg Oral Daily  . sodium chloride  3 mL Intravenous Q12H  . sodium chloride  3 mL Intravenous Q12H  . sodium chloride  3 mL Intravenous Q12H  . thiamine  100 mg Oral Daily  . DISCONTD: metoprolol  50 mg Oral BID  . DISCONTD: rivaroxaban  10 mg Oral Daily   Assessment/Plan: Principal Problem:  *Chest pain, negative nuc in 2008 Active Problems:  SOB (shortness of breath)  Atrial fibrillation  PAD (peripheral artery disease), PTA to Lt. SFA in 2009, total occlusion mid Rt SFA with collaterals   Cocaine abuse  HTN (hypertension)  Tobacco abuse  ETOH abuse  Elevated LFTs  Acute renal insufficiency  Cholelithiases  PLAN:  EKG, A. Fib but improved t wave changes.  Negative MI.  For cardiac cath today. Complains of SOB will decrease IV fluids, BNP yesterday was 2291.  Do not know EF currently, Echo not yet done.  Xarelto d/c'd yesterday now on Heparin.  Continues HTN.with diastolic BP 94 to 113  On ativan prn for alcohol, tobacco withdrawal. Pt aware of his poor habits and is interested in changing them. Pt will need outpatient sleep study, lower ext. Arterial dopplers.  We may proceed with TEE/ DCCV this admit.  LOS: 1 day   INGOLD,LAURA R 05/01/2012, 10:37 AM  I have seen & examined the  patient along with Nada Boozer, NP. I agree with her findings, exam & plan.  Afib rate improved with Diltiazem.  Not using BB due to h/o cocaine use. Less SOB with fluids held.  CR much improved.  I do agree that with known PAD & new onset of Afib - he would benefit from evaluation of his coronary anatomy as described by Dr. Rennis Golden. I am somewhat concerned about the thrombocytopenia however & am reluctant to perform PCI today unless he has a severe high grade culprit lesion. I have discussed this plan with the patient and his wife with the plan to perform diagnostic catheterization today via R radial approach to define his anatomy. Echo done - need to read.  Will need to adjust  medications for HTN, but will wait for LVEDP numbers prior to further adjustment.  Pending coronary anatomy, if non-obstructive, I agree with converting to warfarin as opposed to Xarelto with plan for potential TEE/DCCV Monday.    Need to f/u on thrombocytopenia -- if PCI warranted will obtain Heme Consultation.  CARDIAC CATHETERIZATION CONSENT  The procedure with Risks/Benefits/Alternatives and Indications was reviewed with the patient AND HIS WIFE.  All questions were answered.    Risks / Complications include, but not limited to: Death, MI, CVA/TIA, VF/VT (with defibrillation), Bradycardia (need for temporary pacer placement), contrast induced nephropathy, bleeding / bruising / hematoma / pseudoaneurysm, vascular or coronary injury (with possible emergent CT or Vascular Surgery), adverse medication reactions, infection.    The patient (and family) voice understanding and agree to proceed.   I have signed the consent form and placed it on the chart for patient signature and RN witness.     Marykay Lex, M.D., M.S. [facility] [address] [place], [place]  [address]  [phone]  05/01/2012 12:48 PM

## 2012-05-01 NOTE — Progress Notes (Signed)
CSW stopped by patient's room to discuss SA but patient was being taken down for a Cardiac Cath.  CSW will try and have weekend CSW f/u with patient.   Sabino Niemann, MSW, [employer] [phone]

## 2012-05-01 NOTE — Progress Notes (Signed)
MD [facility] time MD request to contact cardio to reduce fluid down from 100 ml/hr . Ask NP Gold, Np  stated lower to 50 ml/hr

## 2012-05-01 NOTE — Progress Notes (Signed)
ANTICOAGULATION CONSULT NOTE - Follow Up  Pharmacy Consult for IV heparin (Xarelto on hold) Indication: chest pain/ACS, afib  No Known Allergies  Patient Measurements: Height: 6\' 6"  (198.1 cm) Weight: 234 lb 5.6 oz (106.3 kg) IBW/kg (Calculated) : 91.4  Heparin Dosing Weight: 106.3 kg  Vital Signs: Temp: 98.6 F (37 C) (08/08 2208) Temp src: Oral (08/08 2208) BP: 171/106 mmHg (08/08 2337) Pulse Rate: 94  (08/08 2337)  Labs:  Alvira Philips 04/30/12 2248 04/30/12 2220 04/30/12 2000 04/30/12 1303 04/30/12 0847 04/30/12 0803  HGB -- -- -- -- -- 14.4  HCT -- -- -- -- -- 42.6  PLT -- -- -- -- -- 56*  APTT 69* -- -- -- -- --  LABPROT -- -- -- -- 21.4* --  INR -- -- -- -- 1.82* --  HEPARINUNFRC -- -- >2.00* -- -- --  CREATININE -- -- -- -- -- 1.53*  CKTOTAL -- 169 -- 235* -- --  CKMB -- 3.0 -- 3.8 -- --  TROPONINI -- <0.30 -- <0.30 -- --    Estimated Creatinine Clearance: 66.4 ml/min (by C-G formula based on Cr of 1.53).   Medical History: Past Medical History  Diagnosis Date  . PAD (peripheral artery disease)   . Hypertension   . Cocaine abuse   . ETOH abuse   . Tobacco abuse     Medications:  Scheduled:     . aspirin  324 mg Oral Once  . aspirin  324 mg Oral Pre-Cath  . diltiazem  60 mg Oral Q8H  . docusate sodium  100 mg Oral BID  . folic acid  1 mg Oral Daily  . furosemide  20 mg Intravenous Once  . hydrALAZINE  25 mg Oral Q8H  . hydrocortisone cream  1 application Topical TID  . multivitamin with minerals  1 tablet Oral Daily  . predniSONE  20 mg Oral Daily  . sodium chloride  3 mL Intravenous Q12H  . sodium chloride  3 mL Intravenous Q12H  . sodium chloride  3 mL Intravenous Q12H  . thiamine  100 mg Oral Daily  . DISCONTD: metoprolol  50 mg Oral BID  . DISCONTD: rivaroxaban  10 mg Oral Daily    Assessment: 61 yo male on chronic Xarelto for afib (newly diagnosed) admitted with chest pain.  Also with hx cocaine use (last yesterday).  Pharmacy asked to  transition Xarelto to IV heparin.  Last Xarelto dose on 8/7 ~ 2 PM per patient's wife.  Of note, baseline platelet count is 56. Heparin level (>2) due to interaction of residual rivaroxaban, therefore will monitor heparin dosing with aPTT initially. APTT (69 seconds) is at-goal on 1300 units/hr.   Goal of Therapy:  Heparin level 0.3-0.7 units/ml Monitor platelets by anticoagulation protocol: Yes aPTT 50 - 85 seconds   Plan:  1. Continue IV heparin at 1300 units/hr.  2. Daily CBC, heparin level 3. Daily aPTT (until rivaroxaban effects on heparin level decline)  Thad Ranger, Mellody Drown 05/01/2012,12:39 AM

## 2012-05-02 DIAGNOSIS — I472 Ventricular tachycardia: Secondary | ICD-10-CM | POA: Diagnosis not present

## 2012-05-02 DIAGNOSIS — I5031 Acute diastolic (congestive) heart failure: Secondary | ICD-10-CM | POA: Diagnosis present

## 2012-05-02 DIAGNOSIS — E669 Obesity, unspecified: Secondary | ICD-10-CM | POA: Diagnosis present

## 2012-05-02 DIAGNOSIS — Z7901 Long term (current) use of anticoagulants: Secondary | ICD-10-CM

## 2012-05-02 DIAGNOSIS — D696 Thrombocytopenia, unspecified: Secondary | ICD-10-CM | POA: Diagnosis present

## 2012-05-02 LAB — COMPREHENSIVE METABOLIC PANEL
ALT: 62 U/L — ABNORMAL HIGH (ref 0–53)
AST: 43 U/L — ABNORMAL HIGH (ref 0–37)
Albumin: 3.4 g/dL — ABNORMAL LOW (ref 3.5–5.2)
Alkaline Phosphatase: 95 U/L (ref 39–117)
BUN: 19 mg/dL (ref 6–23)
CO2: 29 mEq/L (ref 19–32)
Calcium: 9 mg/dL (ref 8.4–10.5)
Chloride: 102 mEq/L (ref 96–112)
Creatinine, Ser: 1.04 mg/dL (ref 0.50–1.35)
GFR calc Af Amer: 88 mL/min — ABNORMAL LOW (ref >90)
GFR calc non Af Amer: 76 mL/min — ABNORMAL LOW (ref >90)
Glucose, Bld: 118 mg/dL — ABNORMAL HIGH (ref 70–99)
Potassium: 3.4 mEq/L — ABNORMAL LOW (ref 3.5–5.1)
Sodium: 139 mEq/L (ref 135–145)
Total Bilirubin: 0.8 mg/dL (ref 0.3–1.2)
Total Protein: 6.6 g/dL (ref 6.0–8.3)

## 2012-05-02 LAB — HEPARIN LEVEL (UNFRACTIONATED)
Heparin Unfractionated: 1.23 IU/mL — ABNORMAL HIGH (ref 0.30–0.70)
Heparin Unfractionated: 0.72 IU/mL — ABNORMAL HIGH (ref 0.30–0.70)
Heparin Unfractionated: 0.62 IU/mL (ref 0.30–0.70)

## 2012-05-02 LAB — CBC
HCT: 40.2 % (ref 39.0–52.0)
Hemoglobin: 13.7 g/dL (ref 13.0–17.0)
MCH: 33.7 pg (ref 26.0–34.0)
MCHC: 34.1 g/dL (ref 30.0–36.0)
MCV: 98.8 fL (ref 78.0–100.0)
Platelets: 70 10*3/uL — ABNORMAL LOW (ref 150–400)
RBC: 4.07 MIL/uL — ABNORMAL LOW (ref 4.22–5.81)
RDW: 11.9 % (ref 11.5–15.5)
WBC: 8.9 10*3/uL (ref 4.0–10.5)

## 2012-05-02 LAB — APTT
aPTT: 59 seconds — ABNORMAL HIGH (ref 24–37)
aPTT: 62 seconds — ABNORMAL HIGH (ref 24–37)
aPTT: 67 seconds — ABNORMAL HIGH (ref 24–37)

## 2012-05-02 LAB — PRO B NATRIURETIC PEPTIDE: Pro B Natriuretic peptide (BNP): 513.5 pg/mL — ABNORMAL HIGH (ref 0–125)

## 2012-05-02 MED ORDER — METOPROLOL TARTRATE 1 MG/ML IV SOLN
5.0000 mg | Freq: Once | INTRAVENOUS | Status: AC
  Administered 2012-05-02: 5 mg via INTRAVENOUS
  Filled 2012-05-02: qty 5

## 2012-05-02 MED ORDER — WARFARIN VIDEO
Freq: Once | Status: AC
  Administered 2012-05-02: 13:00:00

## 2012-05-02 MED ORDER — DIGOXIN 250 MCG PO TABS
0.2500 mg | ORAL_TABLET | Freq: Two times a day (BID) | ORAL | Status: AC
  Administered 2012-05-02 (×2): 0.25 mg via ORAL
  Filled 2012-05-02 (×2): qty 1

## 2012-05-02 MED ORDER — COUMADIN BOOK
Freq: Once | Status: AC
  Administered 2012-05-02: 13:00:00
  Filled 2012-05-02 (×2): qty 1

## 2012-05-02 MED ORDER — POTASSIUM CHLORIDE CRYS ER 20 MEQ PO TBCR
40.0000 meq | EXTENDED_RELEASE_TABLET | Freq: Once | ORAL | Status: AC
  Administered 2012-05-02: 40 meq via ORAL
  Filled 2012-05-02: qty 2

## 2012-05-02 MED ORDER — DIGOXIN 250 MCG PO TABS
0.2500 mg | ORAL_TABLET | Freq: Once | ORAL | Status: AC
Start: 2012-05-02 — End: 2012-05-02
  Administered 2012-05-02: 0.25 mg via ORAL
  Filled 2012-05-02: qty 1

## 2012-05-02 MED ORDER — DIGOXIN 125 MCG PO TABS: 0.1250 mg | ORAL_TABLET | Freq: Every day | ORAL | Status: DC

## 2012-05-02 MED ORDER — ISOSORBIDE MONONITRATE ER 30 MG PO TB24
30.0000 mg | ORAL_TABLET | Freq: Every day | ORAL | Status: DC
  Administered 2012-05-02 – 2012-05-06 (×4): 30 mg via ORAL
  Filled 2012-05-02 (×6): qty 1

## 2012-05-02 MED ORDER — WARFARIN - PHARMACIST DOSING INPATIENT
Freq: Every day | Status: DC
  Administered 2012-05-05: 18:00:00

## 2012-05-02 MED ORDER — POLYETHYLENE GLYCOL 3350 17 G PO PACK
17.0000 g | PACK | Freq: Two times a day (BID) | ORAL | Status: DC
  Administered 2012-05-02 – 2012-05-05 (×5): 17 g via ORAL
  Filled 2012-05-02 (×11): qty 1

## 2012-05-02 MED ORDER — WARFARIN SODIUM 7.5 MG PO TABS
7.5000 mg | ORAL_TABLET | Freq: Once | ORAL | Status: AC
  Administered 2012-05-02: 7.5 mg via ORAL
  Filled 2012-05-02: qty 1

## 2012-05-02 NOTE — Progress Notes (Signed)
Subjective: Patient feeling still with SOB. No chest pain.   Objective: Filed Vitals:   05/01/12 2200 05/02/12 0253 05/02/12 0446 05/02/12 0959  BP: 160/100 168/99 181/111 155/91  Pulse: 93 90 110 78  Temp:   98.4 F (36.9 C) 98.2 F (36.8 C)  TempSrc:   Oral Oral  Resp:      Height:      Weight:   103.193 kg (227 lb 8 oz)   SpO2:  97% 100% 98%   Weight change: 1.134 kg (2 lb 8 oz)   General: Alert, awake, oriented x3, in no acute distress.  HEENT: No bruits, no goiter.  Heart: Regular rate and rhythm, without murmurs, rubs, gallops.  Lungs: Crackles bases, bilateral air movement.  Abdomen: Soft, nontender, nondistended, positive bowel sounds.  Neuro: Grossly intact, nonfocal. Extremities; no edema.   Lab Results:  [facility] 05/02/12 1100 05/01/12 0625  NA 139 140  K 3.4* 3.9  CL 102 105  CO2 29 22  GLUCOSE 118* 95  BUN 19 22  CREATININE 1.04 1.13  CALCIUM 9.0 8.5  MG -- --  PHOS -- --    Basename 05/02/12 1100 05/01/12 0625  AST 43* 42*  ALT 62* 58*  ALKPHOS 95 116  BILITOT 0.8 0.8  PROT 6.6 6.7  ALBUMIN 3.4* 3.5    Basename 05/02/12 0340 05/01/12 0625 04/30/12 0803  WBC 8.9 8.1 --  NEUTROABS -- -- 7.3  HGB 13.7 14.1 --  HCT 40.2 41.6 --  MCV 98.8 100.7* --  PLT 70* 52* --    Basename 04/30/12 2220 04/30/12 1303  CKTOTAL 169 235*  CKMB 3.0 3.8  CKMBINDEX -- --  TROPONINI <0.30 <0.30    Basename 04/30/12 1303  HGBA1C 5.4    Basename 05/01/12 0625  CHOL 136  HDL 58  LDLCALC 58  TRIG 100  CHOLHDL 2.3  LDLDIRECT --    Basename 04/30/12 1303  TSH 1.773  T4TOTAL --  T3FREE --  THYROIDAB --    Studies/Results: US Abdomen Complete  04/30/2012  *RADIOLOGY REPORT*  Clinical Data:  Increased liver function studies.  Query cirrhosis.  COMPLETE ABDOMINAL ULTRASOUND  Comparison:  None.  Findings:  Gallbladder:  Echogenic shadowing stones in the gallbladder, with stones measured at 1.7 and 1.6 cm diameter.  The gallbladder is contracted with  moderate wall thickening of 6 mm.  The Murphy's sign is negative.  Changes are nonspecific but could be associated with acute cholecystitis in the appropriate clinical setting.  Common bile duct:  Normal caliber with measured diameter of 4 mm.  Liver:  No focal lesion identified.  Diffuse coarsening of liver echotexture which is nonspecific but could be associated with cirrhosis.  IVC:  Appears normal.  Pancreas:  Incomplete visualization of the pancreas.  Visualized portions of the head and body appear unremarkable.  Spleen:  Spleen length measures 4.4 cm.  Normal homogeneous parenchymal echotexture.  Right Kidney:  Right kidney measures 8.5 cm length.  No hydronephrosis.  Mild parenchymal atrophy.  Left Kidney:  Left kidney measures 9.8 cm length.  No hydronephrosis.  Mild parenchymal atrophy.  Abdominal aorta:  Distal aorta is not visualized.  Visualized proximal aorta is normal in caliber.  IMPRESSION: Echogenic stones in the gallbladder with gallbladder contraction and mild wall thickening.  This is a nonspecific appearance but could be associated with cholecystitis in the appropriate clinical setting.  Bilateral renal parenchymal atrophy.  Coarsening of liver parenchymal echotexture which is nonspecific but could be associated with cirrhosis.  Original Report Authenticated By: Marlon Pel, M.D.    Medications: I have reviewed the patient's current medications.  1- Chest pain: concern for angina, ACS in patient with multiple risk factors, current smoker, cocaine use, PVD, HTN.  No BB due to cocaine use. Nitroglycerine and morphine for pain as needed. Chest pain probably setting cocaine use, A fib. Cardiology consult appreciated. LDL 58, TSH 1.7, HB-A1c 5.4 . Cath showed normal CA.   2 SOB (shortness of breath); Multifactorial A fib, component HF, ACS. Receive 1 dose lasix. Controlled A fib.  Add oxygen. Stop IV fluids.  Imdur added.  3-Atrial fibrillation: in setting Cocaine use, alcohol use,  Cycle cardiac enzymes. Cardizem started and heparin. Need to be cautious with heparin in setting thrombocytopenia.  Cardioversion probably Monday.  4-PAD (peripheral artery disease): Doppler outpatient.  5- Cocaine abuse: Counseling provide.  6-HTN (hypertension): Continue with Hydralazine, Cardizem, .  7-Tobacco abuse: Counseling provide.  8-ETOH abuse: Monitor on CIWA protocol. Thiamine and folate.  9- Elevated LFT: Probably related to alcohol use. Hepatitis panel pending, HIV non reactive, RUQ [place]: Echogenic stones in the gallbladder with gallbladder contraction and mild wall thickening. This is a nonspecific appearance but could be associated with cholecystitis in the appropriate clinical setting. Bilateral renal parenchymal atrophy. Coarsening of liver parenchymal echotexture which is nonspecific but could be associated with cirrhosis. Follow trend LFT. Doubt cholecystitis, LFT elevated probably related to alcohol use. No leukocytosis, no fevre, no abdominal pain or nausea.  .  10- Acute renal insufficiency: Probably decrease perfusion. Renal function improved. Avoid nephrotoxic.  11-Thrombocytopenia: Probably related to alcohol use. [place] with some finding could be cirrhosis. Monitor closely on heparin for bleed. Platelet increasing.        LOS: 2 days   Akin Yi M.D.  [facility] 05/02/2012, 2:48 PM

## 2012-05-02 NOTE — Progress Notes (Signed)
Subjective:  Still has DOE.  Objective:  Vital Signs in the last 24 hours: Temp:  [98 F (36.7 C)-98.4 F (36.9 C)] 98.2 F (36.8 C) (08/10 0959) Pulse Rate:  [60-123] 78  (08/10 0959) Resp:  [19] 19  (08/09 1647) BP: (155-210)/(20-193) 155/91 mmHg (08/10 0959) SpO2:  [97 %-100 %] 98 % (08/10 0959) Weight:  [103.193 kg (227 lb 8 oz)] 103.193 kg (227 lb 8 oz) (08/10 0446)  Intake/Output from previous day:  Intake/Output Summary (Last 24 hours) at 05/02/12 1202 Last data filed at 05/02/12 0900  Gross per 24 hour  Intake 1805.35 ml  Output   3550 ml  Net -1744.65 ml    Physical Exam: General appearance: alert, cooperative, no distress and morbidly obese Lungs: decreased breath sounds, no wheezing Heart: irregularly irregular rhythm Abd: soft, NT, ND, NABS Ext: no C/C/E Neuro: grossly normal.   Rate: 115  Rhythm: atrial fibrillation and increased VR. He also had 10bts NSVT  Lab Results:  Basename 05/02/12 0340 05/01/12 0625  WBC 8.9 8.1  HGB 13.7 14.1  PLT 70* 52*    Basename 05/01/12 0625 04/30/12 0803  NA 140 139  K 3.9 4.1  CL 105 103  CO2 22 26  GLUCOSE 95 173*  BUN 22 29*  CREATININE 1.13 1.53*    Basename 04/30/12 2220 04/30/12 1303  TROPONINI <0.30 <0.30   Hepatic Function Panel  Basename 05/01/12 0625  PROT 6.7  ALBUMIN 3.5  AST 42*  ALT 58*  ALKPHOS 116  BILITOT 0.8  BILIDIR --  IBILI --    Basename 05/01/12 0625  CHOL 136    Basename 05/01/12 1049  INR 1.48    Imaging: Imaging results have been reviewed  Cardiac Studies: Bedside Echo review in MD note. Cath - no angiographic evidence of CAD. Severely elevated LVEDP (~69mmHg)  Assessment/Plan:   Principal Problem:  *Acute diastolic HF (heart failure), NYHA class 3 - exaccerbated by Afib with RVR Active Problems:  Chest pain, no significant macrovascular CAD at cath 05/01/12; Likely due to elevated LVEDP & Afib with Diastolic HF.  Atrial fibrillation with RVR  Cocaine  abuse  HTN (hypertension) -Accelerated with End organ involvement  Thrombocytopenia on admission 04/30/12, thought to be secondary to Xarelto  SOB (shortness of breath)  Tobacco abuse  NSVT (nonsustained ventricular tachycardia)  PAD (peripheral artery disease), PTA to Lt. SFA in 2009, total occlusion mid Rt SFA with collaterals   Acute renal insufficiency - resolved after BP control & diuresis  ETOH abuse  Elevated LFTs  Cholelithiases  Obesity  Chronic anticoagulation, recently started on Xarelto for AF; now On Warfarin  Plan- not on beta blocker secondary to history of Cocaine use, though he got one dose IV this am when he had NSVT. Awaiting echo report, LVF. Consider adding Lanoxin for rate control or ? Amio. Will not resume Xarelto secondary to thrombocytopenia on admission. Start Coumadin.  Corine Shelter PA-C 05/02/2012, 12:02 PM  ATTENDING ATTESTATION:  I have seen and examined the patient along with Corine Shelter, PA.  I have reviewed the chart, notes and new data.  I agree with Luke's findinds, exam and recommendations.   Brief Description: 61 y/o man admitted with worsening SOB, Dyspnea on Exertion & PND - several days after Dx of Afib.  Cardiac catheterization yesterday revealed angiographically normal coronary arteries but severely elevated LVEDP.  Initiated on IV Lasix (20mg  bid as he is naiive) with good output.   I reviewed his Echocardiogram at bedside --  EF appears normal with at least Moderate LVH (likely hypertensive heart disease). Afib precludes accurate assessment of diastolic function, but would be abnormal based upon LVH.   Afib rate is better now, but NSVT is concerning (would like to use BB once we are certain of no recent Cocaine).   Key new complaints: feels better, but still a bit SOB.  Key examination changes: Afib - rate controlled, no edema; lungs are relatively clear. No HSM, JVP is ~14 cmH20 c/w dilated IVC on Echo.  Key new findings / data: chem panel  pending. Afib rate improved -- was high overnight.  Run of NSVT in absence of macrovascular disease & normal EF - would like to use BB (could still be microvascular ischemia vs. Increased LV wall stress from elevated LVEDP).  PLAN:  Agree with Warfarin with Heparin Bridge - plan for DCCV +/- TEE on Monday.  May need rhythm control.   Platelets are improving.  Agree with increased CCB dose.  Aggressive BP control -- once Cr stable, will initiate ACE-I, for now will add Nitrate to hydralazine for afterload reduction.   Continue IV Lasix, despite diuresis overnight, IVC remains dilated.    Will need close monitoring and medication adjustment.  Marykay Lex, M.D., M.S. [facility] [address] [place], [place]  [address]  [phone]  05/02/2012 12:02 PM

## 2012-05-02 NOTE — Progress Notes (Signed)
ANTICOAGULATION CONSULT NOTE - Follow Up  Pharmacy Consult for IV heparin and warfarin (Xarelto stopped) Indication: chest pain/ACS, afib  No Known Allergies  Patient Measurements: Height: 6\' 6"  (198.1 cm) Weight: 227 lb 8 oz (103.193 kg) (scale b) IBW/kg (Calculated) : 91.4  Heparin Dosing Weight: 106.3 kg  Vital Signs: Temp: 98.2 F (36.8 C) (08/10 0959) Temp src: Oral (08/10 0959) BP: 155/91 mmHg (08/10 0959) Pulse Rate: 78  (08/10 0959)  Labs:  Basename 05/02/12 1100 05/02/12 0340 05/01/12 1049 05/01/12 0625 04/30/12 2220 04/30/12 1303 04/30/12 0847 04/30/12 0803  HGB -- 13.7 -- 14.1 -- -- -- --  HCT -- 40.2 -- 41.6 -- -- -- 42.6  PLT -- 70* -- 52* -- -- -- 56*  APTT 62* 59* -- 54* -- -- -- --  LABPROT -- -- 18.2* -- -- -- 21.4* --  INR -- -- 1.48 -- -- -- 1.82* --  HEPARINUNFRC 0.72* 1.23* -- 1.89* -- -- -- --  CREATININE 1.04 -- -- 1.13 -- -- -- 1.53*  CKTOTAL -- -- -- -- 169 235* -- --  CKMB -- -- -- -- 3.0 3.8 -- --  TROPONINI -- -- -- -- <0.30 <0.30 -- --    Estimated Creatinine Clearance: 97.6 ml/min (by C-G formula based on Cr of 1.04).  Assessment: 61 yo male on chronic Xarelto for afib (newly diagnosed) admitted with chest pain.  Also with hx cocaine use.  Pharmacy asked to continue heparin dosing post-cath. Noted that he was on Xarelto pta. Last Xarelto dose on 8/7 ~ 2 PM per patient's wife. Platelet count improved to 70. Heparin levels can appear falsely elevated d/t interaction of residual rivaroxaban, therefore we have been monitoring heparin dosing with aPTT.  APTT (62 seconds) is below-goal on 1400 units/hr. Heparin levels starting to trend down indicating rivaroxaban effect starting to wear off.   Goal of Therapy:  Heparin level 0.3-0.7 units/ml Monitor platelets by anticoagulation protocol: Yes aPTT 66 - 102 seconds   Plan:  1. Increase IV heparin to 1500 units/hr.  2. aPTT, heparin level in 6 hours 3. Warfarin 7.5mg  po today - daily INR  Celedonio Miyamoto, PharmD, [facility] Clinical Pharmacist Pager [phone]  05/02/2012 12:19 PM

## 2012-05-02 NOTE — Progress Notes (Signed)
  Echocardiogram 2D Echocardiogram has been performed.  Kevin [name] [name] 05/02/2012, 12:55 PM

## 2012-05-02 NOTE — Progress Notes (Signed)
ANTICOAGULATION CONSULT NOTE - Follow Up  Pharmacy Consult for IV heparin (Xarelto on hold) Indication: chest pain/ACS, afib  No Known Allergies  Patient Measurements: Height: 6\' 6"  (198.1 cm) Weight: 234 lb 5.6 oz (106.3 kg) IBW/kg (Calculated) : 91.4  Heparin Dosing Weight: 106.3 kg  Vital Signs: Temp: 98 F (36.7 C) (08/09 2002) Temp src: Oral (08/09 2002) BP: 168/99 mmHg (08/10 0253) Pulse Rate: 90  (08/10 0253)  Labs:  Basename 05/02/12 0340 05/01/12 1049 05/01/12 0625 04/30/12 2248 04/30/12 2220 04/30/12 2000 04/30/12 1303 04/30/12 0847 04/30/12 0803  HGB 13.7 -- 14.1 -- -- -- -- -- --  HCT 40.2 -- 41.6 -- -- -- -- -- 42.6  PLT 70* -- 52* -- -- -- -- -- 56*  APTT 59* -- 54* 69* -- -- -- -- --  LABPROT -- 18.2* -- -- -- -- -- 21.4* --  INR -- 1.48 -- -- -- -- -- 1.82* --  HEPARINUNFRC 1.23* -- 1.89* -- -- >2.00* -- -- --  CREATININE -- -- 1.13 -- -- -- -- -- 1.53*  CKTOTAL -- -- -- -- 169 -- 235* -- --  CKMB -- -- -- -- 3.0 -- 3.8 -- --  TROPONINI -- -- -- -- <0.30 -- <0.30 -- --    Estimated Creatinine Clearance: 89.9 ml/min (by C-G formula based on Cr of 1.13).  Assessment: 61 yo male on chronic Xarelto for afib (newly diagnosed) admitted with chest pain.  Also with hx cocaine use.  Pharmacy asked to continue heparin dosing post-cath. Noted that he was on Xarelto pta. Last Xarelto dose on 8/7 ~ 2 PM per patient's wife. Platelet count improved to 70. Heparin levels can appear falsely elevated d/t interaction of residual rivaroxaban, therefore we have been monitoring heparin dosing with aPTT.  APTT (59 seconds) is below-goal on 1300 units/hr. Heparin levels starting to trend down indicating rivaroxaban effect starting to wear off.   Goal of Therapy:  Heparin level 0.3-0.7 units/ml Monitor platelets by anticoagulation protocol: Yes aPTT 66 - 102 seconds   Plan:  1. Increase IV heparin to 1400 units/hr.  2. aPTT, heparin level in 6 hours  Lorre Munroe,  PharmD 05/02/2012 4:45 AM

## 2012-05-02 NOTE — Progress Notes (Signed)
ANTICOAGULATION CONSULT NOTE - Follow Up  Pharmacy Consult for IV heparin and warfarin (Xarelto stopped) Indication: chest pain/ACS, afib  No Known Allergies  Patient Measurements: Height: 6\' 6"  (198.1 cm) Weight: 227 lb 8 oz (103.193 kg) (scale b) IBW/kg (Calculated) : 91.4  Heparin Dosing Weight: 106.3 kg  Vital Signs: Temp: 97.4 F (36.3 C) (08/10 1400) Temp src: Oral (08/10 1400) BP: 155/91 mmHg (08/10 1400) Pulse Rate: 81  (08/10 1400)  Labs:  Basename 05/02/12 1841 05/02/12 1100 05/02/12 0340 05/01/12 1049 05/01/12 0625 04/30/12 2220 04/30/12 1303 04/30/12 0847 04/30/12 0803  HGB -- -- 13.7 -- 14.1 -- -- -- --  HCT -- -- 40.2 -- 41.6 -- -- -- 42.6  PLT -- -- 70* -- 52* -- -- -- 56*  APTT 67* 62* 59* -- -- -- -- -- --  LABPROT -- -- -- 18.2* -- -- -- 21.4* --  INR -- -- -- 1.48 -- -- -- 1.82* --  HEPARINUNFRC 0.62 0.72* 1.23* -- -- -- -- -- --  CREATININE -- 1.04 -- -- 1.13 -- -- -- 1.53*  CKTOTAL -- -- -- -- -- 169 235* -- --  CKMB -- -- -- -- -- 3.0 3.8 -- --  TROPONINI -- -- -- -- -- <0.30 <0.30 -- --    Estimated Creatinine Clearance: 97.6 ml/min (by C-G formula based on Cr of 1.04).  Assessment: 61 yo male on chronic Xarelto for afib (newly diagnosed) admitted with chest pain.  Also with hx cocaine use.  Pharmacy asked to continue heparin dosing post-cath. Noted that he was on Xarelto pta. Last Xarelto dose on 8/7 ~ 2 PM per patient's wife. Platelet count improved to 70. Heparin levels can appear falsely elevated d/t interaction of residual rivaroxaban, therefore we have been monitoring heparin dosing with aPTT.  APTT (67 seconds) is therapeutic- on 1500units/hr. Heparin level 0.62 starting to trend down indicating rivaroxaban effect starting to wear off. Suspect heparin level is from heparin infusion and very little residual Xarelto effect.  Goal of Therapy:  Heparin level 0.3-0.7 units/ml Monitor platelets by anticoagulation protocol: Yes aPTT 66 - 102  seconds   Plan:  1. Continue IV heparin at 1500 units/hr.  2. Heparin level, CBC in AM 3. Will stop checking aPTT  Celedonio Miyamoto, PharmD, [facility] Clinical Pharmacist Pager [phone]  05/02/2012 7:56 PM

## 2012-05-03 LAB — CBC
HCT: 38.5 % — ABNORMAL LOW (ref 39.0–52.0)
Hemoglobin: 13.2 g/dL (ref 13.0–17.0)
MCH: 33.7 pg (ref 26.0–34.0)
MCHC: 34.3 g/dL (ref 30.0–36.0)
MCV: 98.2 fL (ref 78.0–100.0)
Platelets: 79 10*3/uL — ABNORMAL LOW (ref 150–400)
RBC: 3.92 MIL/uL — ABNORMAL LOW (ref 4.22–5.81)
RDW: 12.1 % (ref 11.5–15.5)
WBC: 9.4 10*3/uL (ref 4.0–10.5)

## 2012-05-03 LAB — COMPREHENSIVE METABOLIC PANEL
ALT: 59 U/L — ABNORMAL HIGH (ref 0–53)
AST: 39 U/L — ABNORMAL HIGH (ref 0–37)
Albumin: 3.3 g/dL — ABNORMAL LOW (ref 3.5–5.2)
Alkaline Phosphatase: 106 U/L (ref 39–117)
BUN: 17 mg/dL (ref 6–23)
CO2: 25 mEq/L (ref 19–32)
Calcium: 8.5 mg/dL (ref 8.4–10.5)
Chloride: 99 mEq/L (ref 96–112)
Creatinine, Ser: 1.02 mg/dL (ref 0.50–1.35)
GFR calc Af Amer: >90 mL/min (ref >90)
GFR calc non Af Amer: 78 mL/min — ABNORMAL LOW (ref >90)
Glucose, Bld: 100 mg/dL — ABNORMAL HIGH (ref 70–99)
Potassium: 3.8 mEq/L (ref 3.5–5.1)
Sodium: 135 mEq/L (ref 135–145)
Total Bilirubin: 1.1 mg/dL (ref 0.3–1.2)
Total Protein: 6.3 g/dL (ref 6.0–8.3)

## 2012-05-03 LAB — HEPARIN LEVEL (UNFRACTIONATED): Heparin Unfractionated: 0.6 IU/mL (ref 0.30–0.70)

## 2012-05-03 LAB — PROTIME-INR
INR: 1.18 (ref 0.00–1.49)
Prothrombin Time: 15.2 seconds (ref 11.6–15.2)

## 2012-05-03 MED ORDER — WARFARIN SODIUM 7.5 MG PO TABS
7.5000 mg | ORAL_TABLET | Freq: Once | ORAL | Status: AC
  Administered 2012-05-03: 7.5 mg via ORAL
  Filled 2012-05-03: qty 1

## 2012-05-03 MED ORDER — FUROSEMIDE 40 MG PO TABS
40.0000 mg | ORAL_TABLET | Freq: Two times a day (BID) | ORAL | Status: DC
  Administered 2012-05-03 – 2012-05-06 (×5): 40 mg via ORAL
  Filled 2012-05-03 (×8): qty 1

## 2012-05-03 MED ORDER — DILTIAZEM HCL 60 MG PO TABS
120.0000 mg | ORAL_TABLET | Freq: Two times a day (BID) | ORAL | Status: DC
  Administered 2012-05-03 – 2012-05-06 (×6): 120 mg via ORAL
  Filled 2012-05-03 (×7): qty 2

## 2012-05-03 MED ORDER — LISINOPRIL 5 MG PO TABS
5.0000 mg | ORAL_TABLET | Freq: Every day | ORAL | Status: DC
  Administered 2012-05-03 – 2012-05-06 (×4): 5 mg via ORAL
  Filled 2012-05-03 (×4): qty 1

## 2012-05-03 NOTE — Progress Notes (Signed)
Brief Description: 61 y/o man admitted with worsening SOB, Dyspnea on Exertion & PND - several days after Dx of Afib.  Cardiac catheterization yesterday revealed angiographically normal coronary arteries but severely elevated LVEDP.  Initiated on IV Lasix (20mg  bid as he is naiive) with good output.   I reviewed his Echocardiogram at bedside -- EF appears normal with at least Moderate LVH (likely hypertensive heart disease). Afib precludes accurate assessment of diastolic function, but would be abnormal based upon LVH.   Afib rate is better now, but NSVT is concerning (would like to use BB once we are certain of no recent Cocaine).  Subjective:  Noticeably less short of breath with noticeably less abdominal distention  Objective:  Vital Signs in the last 24 hours: Temp:  [97.4 F (36.3 C)-98.2 F (36.8 C)] 97.9 F (36.6 C) (08/11 0522) Pulse Rate:  [78-88] 84  (08/11 0522) Resp:  [18-20] 18  (08/11 0522) BP: (144-155)/(87-91) 148/88 mmHg (08/11 0522) SpO2:  [94 %-100 %] 97 % (08/11 0522) Weight:  [102.558 kg (226 lb 1.6 oz)] 102.558 kg (226 lb 1.6 oz) (08/11 0522)  Intake/Output from previous day:  Intake/Output Summary (Last 24 hours) at 05/03/12 0915 Last data filed at 05/03/12 0837  Gross per 24 hour  Intake 2301.65 ml  Output   3400 ml  Net -1098.35 ml    Physical Exam: General appearance: alert, cooperative, no distress and obese Lungs: decreased breath sounds, no wheezing, rales or rhonchi Heart: irregularly irregular rhythm, normal S1-S2 Abd: soft, NT, ND, NABS - noticeably less firm than previous exam Ext: no C/C/E Neuro: grossly normal.   Only mild HSM, JVP is ~12cmH20.   Rate: 70s-80s  Rhythm: atrial fibrillation and control VR. No further NSVT  Lab Results:  Basename 05/03/12 0520 05/02/12 0340  WBC 9.4 8.9  HGB 13.2 13.7  PLT 79* 70*    Basename 05/03/12 0520 05/02/12 1100  NA 135 139  K 3.8 3.4*  CL 99 102  CO2 25 29  GLUCOSE 100* 118*  BUN 17 19    CREATININE 1.02 1.04    Basename 04/30/12 2220 04/30/12 1303  TROPONINI <0.30 <0.30   Hepatic Function Panel  Basename 05/03/12 0520  PROT 6.3  ALBUMIN 3.3*  AST 39*  ALT 59*  ALKPHOS 106  BILITOT 1.1  BILIDIR --  IBILI --    Basename 05/01/12 0625  CHOL 136    Basename 05/03/12 0520  INR 1.18    Imaging: Imaging results have been reviewed  Cardiac Studies:   Cath - no angiographic evidence of CAD. Severely elevated LVEDP (~65mmHg) Echo - LV cavity size was normal. Wall thickness was increased in a pattern of moderate LVH. Moderate concentric LVH. Normal Systolic function: estimated EF 55% to 65%. No clear wall motion abnormalities, difficult to assess with AF. - Mitral valve: Trivial to mildregurgitation.  - Left atrium: The atrium was likely moderately dilated. - Right ventricle: The cavity size was mildly dilated.  Assessment/Plan:   Principal Problem:  *Acute diastolic HF (heart failure), NYHA class 3 - exaccerbated by Afib with RVR Active Problems:  Chest pain, no significant macrovascular CAD at cath 05/01/12; Likely due to elevated LVEDP & Afib with Diastolic HF.  Atrial fibrillation with RVR  Cocaine abuse  HTN (hypertension) -Accelerated with End organ involvement  Thrombocytopenia on admission 04/30/12, thought to be secondary to Xarelto  SOB (shortness of breath)  Tobacco abuse  Obesity  NSVT (nonsustained ventricular tachycardia)  PAD (peripheral artery disease), PTA to  Lt. SFA in 2009, total occlusion mid Rt SFA with collaterals   Acute renal insufficiency - resolved after BP control & diuresis  ETOH abuse  Elevated LFTs  Cholelithiases  Chronic anticoagulation, recently started on Xarelto for AF; now On Warfarin  Overall, improving.   Key new complaints: feels better, less SOB, abdomen less distended.  Key new findings / data: Renal function stable/acute renal failure resolved, mildly elevated LFTs (possibly due to mild basal on-call  cirrhosis with concomitant congestive hepatopathy)  Afib rate improved overnight. No further NSVT. (could still be microvascular ischemia vs. Increased LV wall stress from elevated LVEDP).  PLAN:  Agree with Warfarin with Heparin Bridge - plan for TEE/DCCV on Monday.  May need rhythm control.  Heart rate stable for CCB dose - will change to 120 twice a day  Would like to use beta blocker in setting of nonsustained VT however still concerned due to recent cocaine abuse.  Aggressive BP control -- at Cr stable, will initiate ACE-I, convert from Nitrate / Hydralazine to ACE inhibitor for afterload reduction.   Will change to by mouth Lasix this PM.    Platelet levels continue to improve  Continue counseling for alcohol, tobacco and cocaine abuse.  N.p.o. after midnight to TEE/DCCV.  Continue CIWA protocol monitoring along with Thaimine & Folate.  Anticipate d/c post DCCV same day vs. Next day.  Marykay Lex, M.D., M.S. [facility] [address] [place], [place]  [address]  [phone]  05/03/2012 9:15 AM

## 2012-05-03 NOTE — Progress Notes (Signed)
Patient feeling better. Platelet increasing. Needs repeat LFT out patient. Dr Herbie Baltimore will take patient on his service. Please call us as needed.   Dorthey Depace, Md.

## 2012-05-03 NOTE — Progress Notes (Signed)
ANTICOAGULATION CONSULT NOTE - Follow Up  Pharmacy Consult for IV heparin and warfarin (Xarelto stopped) Indication: chest pain/ACS, afib  No Known Allergies  Patient Measurements: Height: 6\' 6"  (198.1 cm) Weight: 226 lb 1.6 oz (102.558 kg) (scale b) IBW/kg (Calculated) : 91.4  Heparin Dosing Weight: 106.3 kg  Vital Signs: Temp: 98.1 F (36.7 C) (08/11 0950) Temp src: Oral (08/11 0950) BP: 152/84 mmHg (08/11 0950) Pulse Rate: 88  (08/11 0950)  Labs:  Basename 05/03/12 0520 05/02/12 1841 05/02/12 1100 05/02/12 0340 05/01/12 1049 05/01/12 0625 04/30/12 2220  HGB 13.2 -- -- 13.7 -- -- --  HCT 38.5* -- -- 40.2 -- 41.6 --  PLT 79* -- -- 70* -- 52* --  APTT -- 67* 62* 59* -- -- --  LABPROT 15.2 -- -- -- 18.2* -- --  INR 1.18 -- -- -- 1.48 -- --  HEPARINUNFRC 0.60 0.62 0.72* -- -- -- --  CREATININE 1.02 -- 1.04 -- -- 1.13 --  CKTOTAL -- -- -- -- -- -- 169  CKMB -- -- -- -- -- -- 3.0  TROPONINI -- -- -- -- -- -- <0.30    Estimated Creatinine Clearance: 99.6 ml/min (by C-G formula based on Cr of 1.02).  Assessment: 61 yo male on chronic Xarelto for afib (newly diagnosed) admitted with chest pain.  Also with hx cocaine use.  Pharmacy asked to continue heparin dosing post-cath. Noted that he was on Xarelto pta. Last Xarelto dose on 8/7 ~ 2 PM per patient's wife. Platelet count improved to 79.  Heparin level 0.60 which is within goal. Suspect heparin level is from heparin infusion and very little residual Xarelto effect.  INR 1.18 after one warfarin dose of 7.5mg .  Goal of Therapy:  Heparin level 0.3-0.7 units/ml Monitor platelets by anticoagulation protocol: Yes INR 2-3   Plan:  1. Continue IV heparin at 1500 units/hr.  2. Heparin level, CBC in AM   Celedonio Miyamoto, PharmD, [facility] Clinical Pharmacist Pager [phone]  05/03/2012 1:35 PM

## 2012-05-04 ENCOUNTER — Encounter (HOSPITAL_COMMUNITY)
Admission: EM | Disposition: A | Discharge status: Home / Self Care | Payer: Self-pay | Source: Home / Self Care | Attending: Cardiology

## 2012-05-04 LAB — COMPREHENSIVE METABOLIC PANEL
ALT: 66 U/L — ABNORMAL HIGH (ref 0–53)
AST: 40 U/L — ABNORMAL HIGH (ref 0–37)
Albumin: 3.6 g/dL (ref 3.5–5.2)
Alkaline Phosphatase: 123 U/L — ABNORMAL HIGH (ref 39–117)
BUN: 18 mg/dL (ref 6–23)
CO2: 26 mEq/L (ref 19–32)
Calcium: 8.9 mg/dL (ref 8.4–10.5)
Chloride: 101 mEq/L (ref 96–112)
Creatinine, Ser: 1.02 mg/dL (ref 0.50–1.35)
GFR calc Af Amer: >90 mL/min (ref >90)
GFR calc non Af Amer: 78 mL/min — ABNORMAL LOW (ref >90)
Glucose, Bld: 99 mg/dL (ref 70–99)
Potassium: 3.9 mEq/L (ref 3.5–5.1)
Sodium: 137 mEq/L (ref 135–145)
Total Bilirubin: 0.9 mg/dL (ref 0.3–1.2)
Total Protein: 7.1 g/dL (ref 6.0–8.3)

## 2012-05-04 LAB — CBC
HCT: 43.6 % (ref 39.0–52.0)
Hemoglobin: 15.2 g/dL (ref 13.0–17.0)
MCH: 34.2 pg — ABNORMAL HIGH (ref 26.0–34.0)
MCHC: 34.9 g/dL (ref 30.0–36.0)
MCV: 98 fL (ref 78.0–100.0)
Platelets: 84 10*3/uL — ABNORMAL LOW (ref 150–400)
RBC: 4.45 MIL/uL (ref 4.22–5.81)
RDW: 12.2 % (ref 11.5–15.5)
WBC: 11.1 10*3/uL — ABNORMAL HIGH (ref 4.0–10.5)

## 2012-05-04 LAB — HEPARIN LEVEL (UNFRACTIONATED): Heparin Unfractionated: 0.64 IU/mL (ref 0.30–0.70)

## 2012-05-04 LAB — PROTIME-INR
INR: 1.29 (ref 0.00–1.49)
Prothrombin Time: 16.3 seconds — ABNORMAL HIGH (ref 11.6–15.2)

## 2012-05-04 SURGERY — ECHOCARDIOGRAM, TRANSESOPHAGEAL
Anesthesia: Moderate Sedation

## 2012-05-04 MED ORDER — WARFARIN SODIUM 7.5 MG PO TABS
7.5000 mg | ORAL_TABLET | Freq: Once | ORAL | Status: AC
  Administered 2012-05-04: 7.5 mg via ORAL
  Filled 2012-05-04: qty 1

## 2012-05-04 MED ORDER — SODIUM CHLORIDE 0.9 % IV SOLN
INTRAVENOUS | Status: DC
  Administered 2012-05-04: 10 mL/h via INTRAVENOUS

## 2012-05-04 MED ORDER — HYDRALAZINE HCL 25 MG PO TABS
37.5000 mg | ORAL_TABLET | Freq: Three times a day (TID) | ORAL | Status: DC
  Administered 2012-05-04 – 2012-05-06 (×8): 37.5 mg via ORAL
  Filled 2012-05-04 (×12): qty 1.5

## 2012-05-04 MED ORDER — MAGNESIUM HYDROXIDE 400 MG/5ML PO SUSP
15.0000 mL | Freq: Once | ORAL | Status: DC
  Filled 2012-05-04: qty 30

## 2012-05-04 NOTE — Progress Notes (Signed)
ANTICOAGULATION CONSULT NOTE - Follow Up Consult  Pharmacy Consult for Heparin + Coumadin (Xarelto stopped) Indication: chest pain/ACS, afib  No Known Allergies  Vital Signs: Temp: 98 F (36.7 C) (08/12 0521) Temp src: Oral (08/12 0521) BP: 169/80 mmHg (08/12 0521) Pulse Rate: 79  (08/12 0521)  Labs:  Basename 05/04/12 0540 05/03/12 0520 05/02/12 1841 05/02/12 1100 05/02/12 0340 05/01/12 1049  HGB 15.2 13.2 -- -- -- --  HCT 43.6 38.5* -- -- 40.2 --  PLT 84* 79* -- -- 70* --  APTT -- -- 67* 62* 59* --  LABPROT 16.3* 15.2 -- -- -- 18.2*  INR 1.29 1.18 -- -- -- 1.48  HEPARINUNFRC 0.64 0.60 0.62 -- -- --  CREATININE 1.02 1.02 -- 1.04 -- --  CKTOTAL -- -- -- -- -- --  CKMB -- -- -- -- -- --  TROPONINI -- -- -- -- -- --    Estimated Creatinine Clearance: 99.6 ml/min (by C-G formula based on Cr of 1.02).   Medications:  Heparin at 1500 units/hr  Assessment: 61yom on xarelto pta for afib, now transitioning to coumadin with heparin bridge given thrombocytopenia with xarelto. Heparin level is therapeutic. INR is below goal as expected after two 7.5mg  doses of coumadin, but beginning to increase.  Hgb/Hct remain stable, platelets low but increasing. No bleeding noted.  Goal of Therapy:  INR 2-3 Heparin level 0.3-0.7 Monitor platelets by anticoagulation protocol: Yes   Plan:  1) Continue heparin at 1500 units/hr 2) Repeat coumadin 7.5mg  x 1 3) Follow up INR, heparin level and CBC in AM  Fredrik Rigger 05/04/2012,9:51 AM

## 2012-05-04 NOTE — Progress Notes (Signed)
Utilization Review Completed.  

## 2012-05-04 NOTE — Progress Notes (Signed)
[facility]  Subjective:  No Complaints  Objective: Vital signs in last 24 hours: Temp:  [98 F (36.7 C)-98.3 F (36.8 C)] 98 F (36.7 C) (08/12 0521) Pulse Rate:  [79-83] 79  (08/12 0521) Resp:  [20] 20  (08/12 0521) BP: (137-169)/(80-99) 169/80 mmHg (08/12 0521) SpO2:  [97 %-98 %] 98 % (08/12 0521) Weight:  [100.2 kg (220 lb 14.4 oz)] 100.2 kg (220 lb 14.4 oz) (08/12 0649) Last BM Date: 04/29/12  Intake/Output from previous day: 08/11 0701 - 08/12 0700 In: 1080 [P.O.:1080] Out: 4525 [Urine:4525] Intake/Output this shift:    Medications Current Facility-Administered Medications  Medication Dose Route Frequency Provider Last Rate Last Dose  . 0.9 %  sodium chloride infusion  250 mL Intravenous PRN Marykay Lex, MD      . acetaminophen (TYLENOL) tablet 650 mg  650 mg Oral Q4H PRN Marykay Lex, MD   650 mg at 05/03/12 2024  . diltiazem (CARDIZEM) tablet 120 mg  120 mg Oral Q12H Marykay Lex, MD   120 mg at 05/03/12 2319  . docusate sodium (COLACE) capsule 100 mg  100 mg Oral BID Belkys A Regalado, MD   100 mg at 05/03/12 2318  . folic acid (FOLVITE) tablet 1 mg  1 mg Oral Daily Belkys A Regalado, MD   1 mg at 05/03/12 0952  . furosemide (LASIX) tablet 40 mg  40 mg Oral BID Marykay Lex, MD   40 mg at 05/03/12 1724  . heparin ADULT infusion 100 units/mL (25000 units/250 mL)  1,500 Units/hr Intravenous Continuous Belkys A Regalado, MD 15 mL/hr at 05/04/12 0018 1,500 Units/hr at 05/04/12 0018  . hydrALAZINE (APRESOLINE) tablet 37.5 mg  37.5 mg Oral Q8H Marykay Lex, MD   37.5 mg at 05/04/12 0819  . HYDROcodone-acetaminophen (NORCO/VICODIN) 5-325 MG per tablet 1-2 tablet  1-2 tablet Oral Q4H PRN Belkys A Regalado, MD      . hydrocortisone cream 1 % 1 application  1 application Topical TID Cherre Robins, MD   1 application at 05/03/12 2320  . isosorbide mononitrate (IMDUR) 24 hr tablet 30 mg  30 mg Oral Daily Marykay Lex, MD   30 mg at  05/03/12 0953  . lisinopril (PRINIVIL,ZESTRIL) tablet 5 mg  5 mg Oral Daily Marykay Lex, MD   5 mg at 05/03/12 1225  . LORazepam (ATIVAN) tablet 1 mg  1 mg Oral Q6H PRN Alba Cory, MD       Or  . LORazepam (ATIVAN) injection 1 mg  1 mg Intravenous Q6H PRN Belkys A Regalado, MD   1 mg at 05/01/12 0516  . morphine 2 MG/ML injection 1 mg  1 mg Intravenous Q4H PRN Belkys A Regalado, MD      . multivitamin with minerals tablet 1 tablet  1 tablet Oral Daily Belkys A Regalado, MD   1 tablet at 05/03/12 0952  . nitroGLYCERIN (NITROSTAT) SL tablet 0.4 mg  0.4 mg Sublingual Q5 Min x 3 PRN Belkys A Regalado, MD      . polyethylene glycol (MIRALAX / GLYCOLAX) packet 17 g  17 g Oral BID Belkys A Regalado, MD   17 g at 05/03/12 0952  . predniSONE (DELTASONE) tablet 20 mg  20 mg Oral Daily Cherre Robins, MD   20 mg at 05/03/12 0954  . sodium chloride 0.9 % injection 3 mL  3 mL Intravenous Q12H Marykay Lex, MD   3 mL at 05/03/12 2318  .  sodium chloride 0.9 % injection 3 mL  3 mL Intravenous PRN Marykay Lex, MD      . thiamine (VITAMIN B-1) tablet 100 mg  100 mg Oral Daily Belkys A Regalado, MD   100 mg at 05/03/12 0952  . warfarin (COUMADIN) tablet 7.5 mg  7.5 mg Oral ONCE-1800 Marykay Lex, MD   7.5 mg at 05/03/12 1724  . warfarin (COUMADIN) tablet 7.5 mg  7.5 mg Oral ONCE-1800 Fredrik Rigger, [place]      . Warfarin - Pharmacist Dosing Inpatient   Does not apply q1800 Belkys A Regalado, MD      . DISCONTD: furosemide (LASIX) injection 20 mg  20 mg Intravenous Q12H Marykay Lex, MD   20 mg at 05/03/12 0655  . DISCONTD: hydrALAZINE (APRESOLINE) tablet 37.5 mg  37.5 mg Oral Q8H Marykay Lex, MD   37.5 mg at 05/03/12 2319    PE: General appearance: alert, cooperative and no distress Lungs: clear to auscultation bilaterally Heart: irregularly irregular rhythm Extremities: No LEE Pulses: 2+ and symmetric,  Dorsalis pedis pulses are 1+ on the right side, and 2+ on the left side.    Posterior tibial pulses are 2+ on the right side, and 0 on the left side.  Skin: Warm and dry Neurologic: Grossly normal  Lab Results:   Basename 05/04/12 0540 05/03/12 0520 05/02/12 0340  WBC 11.1* 9.4 8.9  HGB 15.2 13.2 13.7  HCT 43.6 38.5* 40.2  PLT 84* 79* 70*   BMET  Basename 05/04/12 0540 05/03/12 0520 05/02/12 1100  NA 137 135 139  K 3.9 3.8 3.4*  CL 101 99 102  CO2 26 25 29   GLUCOSE 99 100* 118*  BUN 18 17 19   CREATININE 1.02 1.02 1.04  CALCIUM 8.9 8.5 9.0   PT/INR  Basename 05/04/12 0540 05/03/12 0520 05/01/12 1049  LABPROT 16.3* 15.2 18.2*  INR 1.29 1.18 1.48   Cholesterol No results found for this basename: CHOL in the last 72 hours Cardiac Enzymes No components found with this basename: TROPONIN:3, CKMB:3  Studies/Results: @RISRSLT2 @   Assessment/Plan  Principal Problem:  *Acute diastolic HF (heart failure), NYHA class 3 - exaccerbated by Afib with RVR Active Problems:  Atrial fibrillation with RVR  Cocaine abuse  HTN (hypertension) -Accelerated with End organ involvement  PAD (peripheral artery disease), PTA to Lt. SFA in 2009, total occlusion mid Rt SFA with collaterals   Chest pain, no significant macrovascular CAD at cath 05/01/12; Likely due to elevated LVEDP & Afib with Diastolic HF.  SOB (shortness of breath)  Tobacco abuse  ETOH abuse  Elevated LFTs  Acute renal insufficiency - resolved after BP control & diuresis  Cholelithiases  Obesity  Chronic anticoagulation, recently started on Xarelto for AF; now On Warfarin  NSVT (nonsustained ventricular tachycardia)  Thrombocytopenia on admission 04/30/12, thought to be secondary to Xarelto  Plan:  TEE/DCCV for today at 1130hrs.   Normal coronaries on heart cath.  EF 55-65% by Echo  He has diuresed 4.6L in the last two days.  Heparin to coumadin. INR 1.29.  Needs OP sleep study and LEA dopplers.    LOS: 4 days    HAGER, BRYAN 05/04/2012 10:03 AM  I have seen and examined the patient  along with Wilburt Finlay, PA.  I have reviewed the chart, notes and new data.  I agree with PA's note.  Key new complaints: none except he is hungry. Key examination changes: still in AF   PLAN: Tried to  perform TEE CV today, but scheduling difficulties prevented this. Reschedule in AM. The risks and benefits were discussed in detail. This procedure has been fully reviewed with the patient and written informed consent has been obtained. The importance of continued anticoagulation and therefore the need to curtail alcohol and drug use were discussed.  Thurmon Fair, MD, [facility] [phone] 05/04/2012, 11:23 AM

## 2012-05-05 ENCOUNTER — Encounter (HOSPITAL_COMMUNITY): Payer: Self-pay | Admitting: Anesthesiology

## 2012-05-05 ENCOUNTER — Encounter (HOSPITAL_COMMUNITY): Payer: Self-pay | Admitting: Gastroenterology

## 2012-05-05 ENCOUNTER — Encounter (HOSPITAL_COMMUNITY): Payer: Self-pay | Admitting: Certified Registered Nurse Anesthetist

## 2012-05-05 ENCOUNTER — Inpatient Hospital Stay (HOSPITAL_COMMUNITY): Payer: BC Managed Care – PPO | Admitting: Certified Registered Nurse Anesthetist

## 2012-05-05 ENCOUNTER — Encounter (HOSPITAL_COMMUNITY)
Admission: EM | Disposition: A | Discharge status: Home / Self Care | Payer: Self-pay | Source: Home / Self Care | Attending: Cardiology

## 2012-05-05 HISTORY — PX: TEE WITHOUT CARDIOVERSION: SHX5443

## 2012-05-05 HISTORY — PX: CARDIOVERSION: SHX1299

## 2012-05-05 LAB — COMPREHENSIVE METABOLIC PANEL
ALT: 64 U/L — ABNORMAL HIGH (ref 0–53)
AST: 40 U/L — ABNORMAL HIGH (ref 0–37)
Albumin: 3.7 g/dL (ref 3.5–5.2)
Alkaline Phosphatase: 113 U/L (ref 39–117)
BUN: 24 mg/dL — ABNORMAL HIGH (ref 6–23)
CO2: 24 mEq/L (ref 19–32)
Calcium: 8.9 mg/dL (ref 8.4–10.5)
Chloride: 100 mEq/L (ref 96–112)
Creatinine, Ser: 1.21 mg/dL (ref 0.50–1.35)
GFR calc Af Amer: 73 mL/min — ABNORMAL LOW (ref >90)
GFR calc non Af Amer: 63 mL/min — ABNORMAL LOW (ref >90)
Glucose, Bld: 108 mg/dL — ABNORMAL HIGH (ref 70–99)
Potassium: 4.1 mEq/L (ref 3.5–5.1)
Sodium: 135 mEq/L (ref 135–145)
Total Bilirubin: 0.8 mg/dL (ref 0.3–1.2)
Total Protein: 7.4 g/dL (ref 6.0–8.3)

## 2012-05-05 LAB — CBC
HCT: 45.9 % (ref 39.0–52.0)
Hemoglobin: 15.9 g/dL (ref 13.0–17.0)
MCH: 34.3 pg — ABNORMAL HIGH (ref 26.0–34.0)
MCHC: 34.6 g/dL (ref 30.0–36.0)
MCV: 98.9 fL (ref 78.0–100.0)
Platelets: 83 10*3/uL — ABNORMAL LOW (ref 150–400)
RBC: 4.64 MIL/uL (ref 4.22–5.81)
RDW: 12.3 % (ref 11.5–15.5)
WBC: 13.7 10*3/uL — ABNORMAL HIGH (ref 4.0–10.5)

## 2012-05-05 LAB — PROTIME-INR
INR: 1.55 — ABNORMAL HIGH (ref 0.00–1.49)
Prothrombin Time: 18.9 seconds — ABNORMAL HIGH (ref 11.6–15.2)

## 2012-05-05 LAB — HEPARIN LEVEL (UNFRACTIONATED): Heparin Unfractionated: 0.6 IU/mL (ref 0.30–0.70)

## 2012-05-05 LAB — VITAMIN B12: Vitamin B-12: 475 pg/mL (ref 211–911)

## 2012-05-05 SURGERY — ECHOCARDIOGRAM, TRANSESOPHAGEAL
Anesthesia: Moderate Sedation

## 2012-05-05 MED ORDER — BUTAMBEN-TETRACAINE-BENZOCAINE 2-2-14 % EX AERO
INHALATION_SPRAY | CUTANEOUS | Status: DC | PRN
  Administered 2012-05-05: 2 via TOPICAL

## 2012-05-05 MED ORDER — FENTANYL CITRATE 0.05 MG/ML IJ SOLN
INTRAMUSCULAR | Status: AC
  Filled 2012-05-05: qty 2

## 2012-05-05 MED ORDER — PROPOFOL 10 MG/ML IV EMUL
INTRAVENOUS | Status: DC | PRN
  Administered 2012-05-05: 200 mg via INTRAVENOUS
  Administered 2012-05-05: 30 mg via INTRAVENOUS

## 2012-05-05 MED ORDER — LIDOCAINE HCL 4 % MT SOLN
OROMUCOSAL | Status: DC | PRN
  Administered 2012-05-05: 4 mL via TOPICAL

## 2012-05-05 MED ORDER — WARFARIN SODIUM 7.5 MG PO TABS
7.5000 mg | ORAL_TABLET | Freq: Once | ORAL | Status: AC
  Administered 2012-05-05: 7.5 mg via ORAL
  Filled 2012-05-05: qty 1

## 2012-05-05 MED ORDER — DIPHENHYDRAMINE HCL 50 MG/ML IJ SOLN
INTRAMUSCULAR | Status: AC
  Filled 2012-05-05: qty 1

## 2012-05-05 MED ORDER — MIDAZOLAM HCL 10 MG/2ML IJ SOLN
INTRAMUSCULAR | Status: AC
  Filled 2012-05-05: qty 2

## 2012-05-05 MED ORDER — SUCCINYLCHOLINE CHLORIDE 20 MG/ML IJ SOLN
INTRAMUSCULAR | Status: DC | PRN
  Administered 2012-05-05: 80 mg via INTRAVENOUS

## 2012-05-05 NOTE — Progress Notes (Signed)
ANTICOAGULATION CONSULT NOTE - Follow Up Consult  Pharmacy Consult for Heparin + Coumadin (Xarelto stopped) Indication: atrial fibrillation  No Known Allergies  Vital Signs: Temp: 98.2 F (36.8 C) (08/13 0751) Temp src: Oral (08/13 0751) BP: 158/70 mmHg (08/13 1011) Pulse Rate: 87  (08/13 0751)  Labs:  Basename 05/05/12 0511 05/04/12 0540 05/03/12 0520 05/02/12 1841 05/02/12 1100  HGB 15.9 15.2 -- -- --  HCT 45.9 43.6 38.5* -- --  PLT 83* 84* 79* -- --  APTT -- -- -- 67* 62*  LABPROT 18.9* 16.3* 15.2 -- --  INR 1.55* 1.29 1.18 -- --  HEPARINUNFRC 0.60 0.64 0.60 -- --  CREATININE 1.21 1.02 1.02 -- --  CKTOTAL -- -- -- -- --  CKMB -- -- -- -- --  TROPONINI -- -- -- -- --    Estimated Creatinine Clearance: 83.9 ml/min (by C-G formula based on Cr of 1.21).  Medications:  Heparin @ 1500 units/hr  Assessment: 61yom on xarelto pta for afib, continues his transition to coumadin with heparin bridge. Heparin level is therapeutic. INR is below goal but trending up appropriately with 7.5mg  doses.  Hgb/Hct remain stable, platelets low but stable. No bleeding noted. He is s/p DCCV this morning and maintaining NSR.  Goal of Therapy:  INR 2-3 Heparin level 0.3-0.7 Monitor platelets by anticoagulation protocol: Yes   Plan:  1) Continue heparin at 1500 units/hr 2) Repeat coumadin 7.5mg  x 1 3) Follow up INR, heparin level, and CBC in AM  Fredrik Rigger 05/05/2012,10:23 AM

## 2012-05-05 NOTE — Progress Notes (Signed)
[facility]  DAILY PROGRESS NOTE   Subjective:  Vague left sided chest discomfort. No dyspnea, unaware of palpitations.  Objective:  Temp:  [97.4 F (36.3 C)-98.2 F (36.8 C)] 98.2 F (36.8 C) (08/13 0751) Pulse Rate:  [71-87] 87  (08/13 0751) Resp:  [15-18] 15  (08/13 0751) BP: (147-196)/(72-111) 196/111 mmHg (08/13 0751) SpO2:  [95 %-99 %] 97 % (08/13 0751) Weight:  [100.2 kg (220 lb 14.4 oz)] 100.2 kg (220 lb 14.4 oz) (08/13 0500) Weight change: 0 kg (0 lb)  Intake/Output from previous day: 08/12 0701 - 08/13 0700 In: 960 [P.O.:960] Out: 2775 [Urine:2775]  Intake/Output from this shift:    Medications: Current Facility-Administered Medications  Medication Dose Route Frequency Provider Last Rate Last Dose  . 0.9 %  sodium chloride infusion  250 mL Intravenous PRN Marykay Lex, MD      . 0.9 %  sodium chloride infusion   Intravenous Continuous Thurmon Fair, MD 10 mL/hr at 05/04/12 1114 10 mL/hr at 05/04/12 1114  . acetaminophen (TYLENOL) tablet 650 mg  650 mg Oral Q4H PRN Marykay Lex, MD   650 mg at 05/03/12 2024  . diltiazem (CARDIZEM) tablet 120 mg  120 mg Oral Q12H Marykay Lex, MD   120 mg at 05/04/12 2235  . docusate sodium (COLACE) capsule 100 mg  100 mg Oral BID Belkys A Regalado, MD   100 mg at 05/04/12 2236  . folic acid (FOLVITE) tablet 1 mg  1 mg Oral Daily Belkys A Regalado, MD   1 mg at 05/04/12 1112  . furosemide (LASIX) tablet 40 mg  40 mg Oral BID Marykay Lex, MD   40 mg at 05/04/12 1720  . heparin ADULT infusion 100 units/mL (25000 units/250 mL)  1,500 Units/hr Intravenous Continuous Belkys A Regalado, MD 15 mL/hr at 05/04/12 1723 1,500 Units/hr at 05/04/12 1723  . hydrALAZINE (APRESOLINE) tablet 37.5 mg  37.5 mg Oral Q8H Marykay Lex, MD   37.5 mg at 05/05/12 0526  . HYDROcodone-acetaminophen (NORCO/VICODIN) 5-325 MG per tablet 1-2 tablet  1-2 tablet Oral Q4H PRN Belkys A Regalado, MD      . hydrocortisone  cream 1 % 1 application  1 application Topical TID Cherre Robins, MD   1 application at 05/04/12 2241  . isosorbide mononitrate (IMDUR) 24 hr tablet 30 mg  30 mg Oral Daily Marykay Lex, MD   30 mg at 05/04/12 1112  . lisinopril (PRINIVIL,ZESTRIL) tablet 5 mg  5 mg Oral Daily Marykay Lex, MD   5 mg at 05/04/12 1113  . magnesium hydroxide (MILK OF MAGNESIA) suspension 15 mL  15 mL Oral Once Wilburt Finlay, PA      . morphine 2 MG/ML injection 1 mg  1 mg Intravenous Q4H PRN Belkys A Regalado, MD      . multivitamin with minerals tablet 1 tablet  1 tablet Oral Daily Belkys A Regalado, MD   1 tablet at 05/04/12 1112  . nitroGLYCERIN (NITROSTAT) SL tablet 0.4 mg  0.4 mg Sublingual Q5 Min x 3 PRN Belkys A Regalado, MD      . polyethylene glycol (MIRALAX / GLYCOLAX) packet 17 g  17 g Oral BID Belkys A Regalado, MD   17 g at 05/04/12 1112  . predniSONE (DELTASONE) tablet 20 mg  20 mg Oral Daily Cherre Robins, MD   20 mg at 05/04/12 1111  . sodium chloride 0.9 % injection 3 mL  3 mL Intravenous Q12H  Marykay Lex, MD   3 mL at 05/04/12 2235  . sodium chloride 0.9 % injection 3 mL  3 mL Intravenous PRN Marykay Lex, MD      . thiamine (VITAMIN B-1) tablet 100 mg  100 mg Oral Daily Belkys A Regalado, MD   100 mg at 05/04/12 1112  . warfarin (COUMADIN) tablet 7.5 mg  7.5 mg Oral ONCE-1800 Fredrik Rigger, PHARMD   7.5 mg at 05/04/12 1720  . Warfarin - Pharmacist Dosing Inpatient   Does not apply q1800 Alba Cory, MD        Physical Exam: General appearance: alert, cooperative and no distress Neck: no adenopathy, no carotid bruit, no JVD, supple, symmetrical, trachea midline and thyroid not enlarged, symmetric, no tenderness/mass/nodules Lungs: clear to auscultation bilaterally Heart: normal apical impulse, regular rate and rhythm and S1, S2 normal Abdomen: soft, non-tender; bowel sounds normal; no masses,  no organomegaly Extremities: extremities normal, atraumatic, no cyanosis or  edema Pulses: 2+ and symmetric Skin: Skin color, texture, turgor normal. No rashes or lesions Neurologic: Grossly normal  Lab Results: Results for orders placed during the hospital encounter of 04/30/12 (from the past 48 hour(s))  CBC     Status: Abnormal   Collection Time   05/04/12  5:40 AM      Component Value Range Comment   WBC 11.1 (*) 4.0 - 10.5 K/uL    RBC 4.45  4.22 - 5.81 MIL/uL    Hemoglobin 15.2  13.0 - 17.0 g/dL    HCT 28.4  13.2 - 44.0 %    MCV 98.0  78.0 - 100.0 fL    MCH 34.2 (*) 26.0 - 34.0 pg    MCHC 34.9  30.0 - 36.0 g/dL    RDW 10.2  72.5 - 36.6 %    Platelets 84 (*) 150 - 400 K/uL CONSISTENT WITH PREVIOUS RESULT  HEPARIN LEVEL (UNFRACTIONATED)     Status: Normal   Collection Time   05/04/12  5:40 AM      Component Value Range Comment   Heparin Unfractionated 0.64  0.30 - 0.70 IU/mL   COMPREHENSIVE METABOLIC PANEL     Status: Abnormal   Collection Time   05/04/12  5:40 AM      Component Value Range Comment   Sodium 137  135 - 145 mEq/L    Potassium 3.9  3.5 - 5.1 mEq/L    Chloride 101  96 - 112 mEq/L    CO2 26  19 - 32 mEq/L    Glucose, Bld 99  70 - 99 mg/dL    BUN 18  6 - 23 mg/dL    Creatinine, Ser 4.40  0.50 - 1.35 mg/dL    Calcium 8.9  8.4 - 34.7 mg/dL    Total Protein 7.1  6.0 - 8.3 g/dL    Albumin 3.6  3.5 - 5.2 g/dL    AST 40 (*) 0 - 37 U/L    ALT 66 (*) 0 - 53 U/L    Alkaline Phosphatase 123 (*) 39 - 117 U/L    Total Bilirubin 0.9  0.3 - 1.2 mg/dL    GFR calc non Af Amer 78 (*) >90 mL/min    GFR calc Af Amer >90  >90 mL/min   PROTIME-INR     Status: Abnormal   Collection Time   05/04/12  5:40 AM      Component Value Range Comment   Prothrombin Time 16.3 (*) 11.6 - 15.2 seconds  INR 1.29  0.00 - 1.49   CBC     Status: Abnormal   Collection Time   05/05/12  5:11 AM      Component Value Range Comment   WBC 13.7 (*) 4.0 - 10.5 K/uL    RBC 4.64  4.22 - 5.81 MIL/uL    Hemoglobin 15.9  13.0 - 17.0 g/dL    HCT 16.1  09.6 - 04.5 %    MCV 98.9   78.0 - 100.0 fL    MCH 34.3 (*) 26.0 - 34.0 pg    MCHC 34.6  30.0 - 36.0 g/dL    RDW 40.9  81.1 - 91.4 %    Platelets 83 (*) 150 - 400 K/uL CONSISTENT WITH PREVIOUS RESULT  HEPARIN LEVEL (UNFRACTIONATED)     Status: Normal   Collection Time   05/05/12  5:11 AM      Component Value Range Comment   Heparin Unfractionated 0.60  0.30 - 0.70 IU/mL   COMPREHENSIVE METABOLIC PANEL     Status: Abnormal   Collection Time   05/05/12  5:11 AM      Component Value Range Comment   Sodium 135  135 - 145 mEq/L    Potassium 4.1  3.5 - 5.1 mEq/L    Chloride 100  96 - 112 mEq/L    CO2 24  19 - 32 mEq/L    Glucose, Bld 108 (*) 70 - 99 mg/dL    BUN 24 (*) 6 - 23 mg/dL    Creatinine, Ser 7.82  0.50 - 1.35 mg/dL    Calcium 8.9  8.4 - 95.6 mg/dL    Total Protein 7.4  6.0 - 8.3 g/dL    Albumin 3.7  3.5 - 5.2 g/dL    AST 40 (*) 0 - 37 U/L    ALT 64 (*) 0 - 53 U/L    Alkaline Phosphatase 113  39 - 117 U/L    Total Bilirubin 0.8  0.3 - 1.2 mg/dL    GFR calc non Af Amer 63 (*) >90 mL/min    GFR calc Af Amer 73 (*) >90 mL/min   PROTIME-INR     Status: Abnormal   Collection Time   05/05/12  5:11 AM      Component Value Range Comment   Prothrombin Time 18.9 (*) 11.6 - 15.2 seconds    INR 1.55 (*) 0.00 - 1.49     Imaging: No results found.  Assessment:  1. Principal Problem: 2.  *Acute diastolic HF (heart failure), NYHA class 3 - exaccerbated by Afib with RVR 3. Active Problems: 4.  Chest pain, no significant macrovascular CAD at cath 05/01/12; Likely due to elevated LVEDP & Afib with Diastolic HF. 5.  SOB (shortness of breath) 6.  Atrial fibrillation with RVR 7.  PAD (peripheral artery disease), PTA to Lt. SFA in 2009, total occlusion mid Rt SFA with collaterals  8.  Cocaine abuse 9.  HTN (hypertension) -Accelerated with End organ involvement 10.  Tobacco abuse 11.  ETOH abuse 12.  Elevated LFTs 13.  Acute renal insufficiency - resolved after BP control & diuresis 14.  Cholelithiases 15.   Obesity 16.  Chronic anticoagulation, recently started on Xarelto for AF; now On Warfarin 17.  NSVT (nonsustained ventricular tachycardia) 18.  Thrombocytopenia on admission 04/30/12, thought to be secondary to Xarelto 19.   Plan:  1. Proceed with synchronized cardioversion as long as TEE does not show thrombus.  Tried to impress upon him how important alcohol and  cocaine cessation is to prevent stroke and death.  Time Spent Directly with Patient:  30 minutes  Length of Stay:  LOS: 5 days    Kevin [name] 05/05/2012, 8:02 AM

## 2012-05-05 NOTE — Anesthesia Preprocedure Evaluation (Addendum)
Anesthesia Evaluation  Patient identified by MRN, date of birth, ID band  Reviewed: Allergy & Precautions, H&P , NPO status   Airway Mallampati: III TM Distance: >3 FB Neck ROM: Full    Dental  (+) Dental Advisory Given   Pulmonary shortness of breath,          Cardiovascular hypertension, + dysrhythmias Atrial Fibrillation Rhythm:irregular Rate:Normal     Neuro/Psych    GI/Hepatic   Endo/Other    Renal/GU      Musculoskeletal   Abdominal   Peds  Hematology   Anesthesia Other Findings Substance abuse hx   Reproductive/Obstetrics                          Anesthesia Physical Anesthesia Plan  ASA: III  Anesthesia Plan: General   Post-op Pain Management:    Induction:   Airway Management Planned: Oral ETT  Additional Equipment:   Intra-op Plan:   Post-operative Plan: Extubation in OR  Informed Consent: I have reviewed the patients History and Physical, chart, labs and discussed the procedure including the risks, benefits and alternatives for the proposed anesthesia with the patient or authorized representative who has indicated his/her understanding and acceptance.   Dental advisory given  Plan Discussed with: CRNA, Anesthesiologist and Surgeon  Anesthesia Plan Comments:         Anesthesia Quick Evaluation

## 2012-05-05 NOTE — Preoperative (Signed)
Beta Blockers   Reason not to administer Beta Blockers:Not Applicable 

## 2012-05-05 NOTE — CV Procedure (Signed)
Labrum,Keyen T Male, 61 y.o., 12/07/1950  Location: [facility] ENDOSCOPY  Bed: NONE  MRN: [id number]  CSN: [id number]   Procedure: TEE and Electrical Cardioversion Indications:  Atrial Fibrillation  Procedure Details:  Consent: Risks of procedure as well as the alternatives and risks of each were explained to the (patient/caregiver).  Consent for procedure obtained.  Time Out: Verified patient identification, verified procedure, site/side was marked, verified correct patient position, special equipment/implants available, medications/allergies/relevent history reviewed, required imaging and test results available.  Performed  Patient placed on cardiac monitor, pulse oximetry, supplemental oxygen as necessary.  Sedation given: IV propofol total 230 mg, Dr. Katrinka Blazing, Anesthesiology Procedure performed with deep sedation and endotracheal intubation. Pacer pads placed anterior and posterior chest.  Cardioverted 1 time(s).  Cardioverted at 150J.  Evaluation: Findings: Post procedure EKG shows: NSR Complications: None Patient did tolerate procedure well.  Time Spent Directly with the Patient:  45 minutes   Harlean Regula 05/05/2012, 8:50 AM

## 2012-05-05 NOTE — Anesthesia Postprocedure Evaluation (Signed)
  Anesthesia Post-op Note  Patient: Kevin [name]  Procedure(s) Performed: Procedure(s) (LRB): TRANSESOPHAGEAL ECHOCARDIOGRAM (TEE) (N/A) CARDIOVERSION (N/A)  Patient Location: Endoscopy Unit  Anesthesia Type: General  Level of Consciousness: awake, oriented and patient cooperative  Airway and Oxygen Therapy: Patient Spontanous Breathing and Patient connected to nasal cannula oxygen  Post-op Pain: none  Post-op Assessment: Post-op Vital signs reviewed, Patient's Cardiovascular Status Stable, Respiratory Function Stable, Patent Airway, No signs of Nausea or vomiting and Pain level controlled  Post-op Vital Signs: stable  Complications: No apparent anesthesia complications

## 2012-05-05 NOTE — Progress Notes (Signed)
Cosigned for Jes Smith RN for VS, Medications administration, assessments, care plans, patient educations, and I & O's. Kevin [name] M RN    

## 2012-05-05 NOTE — Progress Notes (Signed)
  Echocardiogram Echocardiogram Transesophageal has been performed.  Kevin [name] 05/05/2012, 9:00 AM

## 2012-05-05 NOTE — Anesthesia Procedure Notes (Signed)
Procedure Name: Intubation Date/Time: 05/05/2012 8:33 AM Performed by: Rogelia Boga Pre-anesthesia Checklist: Patient identified, Emergency Drugs available, Suction available, Patient being monitored and Timeout performed Patient Re-evaluated:Patient Re-evaluated prior to inductionOxygen Delivery Method: Circle system utilized and Ambu bag Preoxygenation: Pre-oxygenation with 100% oxygen Intubation Type: IV induction Laryngoscope Size: Mac and 3 Grade View: Grade I Tube type: Oral Tube size: 7.5 mm Number of attempts: 1 Airway Equipment and Method: Stylet Placement Confirmation: ETT inserted through vocal cords under direct vision,  positive ETCO2 and breath sounds checked- equal and bilateral Secured at: 22 cm Tube secured with: Tape Dental Injury: Teeth and Oropharynx as per pre-operative assessment

## 2012-05-05 NOTE — Transfer of Care (Signed)
Immediate Anesthesia Transfer of Care Note  Patient: Kevin [name]  Procedure(s) Performed: Procedure(s) (LRB): TRANSESOPHAGEAL ECHOCARDIOGRAM (TEE) (N/A) CARDIOVERSION (N/A)  Patient Location: PACU  Anesthesia Type: General  Level of Consciousness: awake, alert  and oriented  Airway & Oxygen Therapy: Patient Spontanous Breathing and Patient connected to nasal cannula oxygen  Post-op Assessment: Report given to PACU RN and Post -op Vital signs reviewed and stable  Post vital signs: Reviewed and stable  Complications: No apparent anesthesia complications

## 2012-05-05 NOTE — ED Provider Notes (Signed)
I saw and evaluated the patient, reviewed the resident's note and I agree with the findings and plan.  Toy Baker, MD 05/05/12 [phone]

## 2012-05-05 NOTE — Addendum Note (Signed)
Addendum  created 05/05/12 0900 by Theodosia Quay, CRNA   Modules edited:Charges VN

## 2012-05-06 ENCOUNTER — Encounter (HOSPITAL_COMMUNITY): Payer: Self-pay | Admitting: Cardiovascular Disease

## 2012-05-06 LAB — COMPREHENSIVE METABOLIC PANEL
ALT: 72 U/L — ABNORMAL HIGH (ref 0–53)
AST: 53 U/L — ABNORMAL HIGH (ref 0–37)
Albumin: 3.5 g/dL (ref 3.5–5.2)
Alkaline Phosphatase: 112 U/L (ref 39–117)
BUN: 19 mg/dL (ref 6–23)
CO2: 24 mEq/L (ref 19–32)
Calcium: 8.7 mg/dL (ref 8.4–10.5)
Chloride: 103 mEq/L (ref 96–112)
Creatinine, Ser: 1.08 mg/dL (ref 0.50–1.35)
GFR calc Af Amer: 84 mL/min — ABNORMAL LOW (ref >90)
GFR calc non Af Amer: 73 mL/min — ABNORMAL LOW (ref >90)
Glucose, Bld: 102 mg/dL — ABNORMAL HIGH (ref 70–99)
Potassium: 4 mEq/L (ref 3.5–5.1)
Sodium: 137 mEq/L (ref 135–145)
Total Bilirubin: 0.9 mg/dL (ref 0.3–1.2)
Total Protein: 6.7 g/dL (ref 6.0–8.3)

## 2012-05-06 LAB — HEPARIN LEVEL (UNFRACTIONATED): Heparin Unfractionated: 0.5 IU/mL (ref 0.30–0.70)

## 2012-05-06 LAB — CBC
HCT: 44.2 % (ref 39.0–52.0)
Hemoglobin: 15.1 g/dL (ref 13.0–17.0)
MCH: 34.1 pg — ABNORMAL HIGH (ref 26.0–34.0)
MCHC: 34.2 g/dL (ref 30.0–36.0)
MCV: 99.8 fL (ref 78.0–100.0)
Platelets: 68 10*3/uL — ABNORMAL LOW (ref 150–400)
RBC: 4.43 MIL/uL (ref 4.22–5.81)
RDW: 12.5 % (ref 11.5–15.5)
WBC: 7.8 10*3/uL (ref 4.0–10.5)

## 2012-05-06 LAB — PROTIME-INR
INR: 1.95 — ABNORMAL HIGH (ref 0.00–1.49)
Prothrombin Time: 22.6 seconds — ABNORMAL HIGH (ref 11.6–15.2)

## 2012-05-06 MED ORDER — HYDRALAZINE HCL 25 MG PO TABS: 37.5000 mg | ORAL_TABLET | Freq: Three times a day (TID) | ORAL | Status: DC

## 2012-05-06 MED ORDER — WARFARIN SODIUM 5 MG PO TABS
7.5000 mg | ORAL_TABLET | Freq: Every day | ORAL | Status: DC
Start: 2012-05-06 — End: 2014-04-19

## 2012-05-06 MED ORDER — FOLIC ACID 1 MG PO TABS: 1.0000 mg | ORAL_TABLET | Freq: Every day | ORAL | Status: AC

## 2012-05-06 MED ORDER — FUROSEMIDE 40 MG PO TABS: 40.0000 mg | ORAL_TABLET | Freq: Two times a day (BID) | ORAL | Status: DC

## 2012-05-06 MED ORDER — ADULT MULTIVITAMIN W/MINERALS CH: 1.0000 | ORAL_TABLET | Freq: Every day | ORAL | Status: DC

## 2012-05-06 MED ORDER — DILTIAZEM HCL 120 MG PO TABS: 120.0000 mg | ORAL_TABLET | Freq: Two times a day (BID) | ORAL | Status: DC

## 2012-05-06 MED ORDER — LISINOPRIL 5 MG PO TABS: 5.0000 mg | ORAL_TABLET | Freq: Every day | ORAL | Status: DC

## 2012-05-06 MED ORDER — ISOSORBIDE MONONITRATE ER 30 MG PO TB24: 30.0000 mg | ORAL_TABLET | Freq: Every day | ORAL | Status: DC

## 2012-05-06 MED ORDER — DSS 100 MG PO CAPS: 100.0000 mg | ORAL_CAPSULE | Freq: Two times a day (BID) | ORAL | Status: AC | PRN

## 2012-05-06 MED ORDER — THIAMINE HCL 100 MG PO TABS: 100.0000 mg | ORAL_TABLET | Freq: Every day | ORAL | Status: AC

## 2012-05-06 MED ORDER — WARFARIN SODIUM 7.5 MG PO TABS
7.5000 mg | ORAL_TABLET | Freq: Once | ORAL | Status: DC
  Filled 2012-05-06: qty 1

## 2012-05-06 NOTE — Progress Notes (Signed)
DC IV, DC Tele, DC Home. Discharge instructions and home medications discussed with patient. Patient denied any questions or concerns at this time. Patient leaving unit ambulatory and appears in no acute distress. 

## 2012-05-06 NOTE — Discharge Summary (Signed)
Physician Discharge Summary  Patient ID: Kevin [name] MRN: [id number] DOB/AGE: 06/16/1951 61 y.o.  Admit date: 04/30/2012 Discharge date: 05/06/2012  Discharge Diagnoses:  Principal Problem:  *Acute diastolic HF (heart failure), NYHA class 3 - exaccerbated by Afib with RVR Active Problems:  Chest pain, no significant macrovascular CAD at cath 05/01/12; Likely due to elevated LVEDP & Afib with Diastolic HF.  SOB (shortness of breath)  Atrial fibrillation with RVR, converted to SR with TEE and DCCV 05/05/12  PAD (peripheral artery disease), PTA to Lt. SFA in 2009, total occlusion mid Rt SFA with collaterals   Cocaine abuse  HTN (hypertension) -Accelerated with End organ involvement  Tobacco abuse  ETOH abuse  Elevated LFTs  Acute renal insufficiency - resolved after BP control & diuresis  Cholelithiases  Obesity  Chronic anticoagulation, recently started on Xarelto for AF; now On Warfarin  NSVT (nonsustained ventricular tachycardia)  Thrombocytopenia on admission 04/30/12, thought to be secondary to Xarelto, continues at discharge   Discharged Condition: good  Peak weight 106.3 kg- 234 lbs. 5 oz. Discharge weight 100.9 kilograms- 222 lbs. 9 oz.  Procedure:Cardiac catheterization  05/01/2012 by Dr. Bryan Lemma 05/05/2012 TEE and cardioversion by Dr. Karl Luke Course: 61 year old African American obese male with history of peripheral artery disease, newly diagnosed atrial fibrillation, tobacco use, cocaine use, alcohol abuse, hypertension. Was last seen by Dr. Alanda Amass and Dr. Jacinto Halim in January 2009 at which time he had peripheral vascular angiogram which revealed total occlusion of the right mid superficial femoral artery with collaterals and total occlusion of the distal infrageniculate popliteal and tibial peroneal on the right with good vessels collateralized RAT and RPT. He had high-grade focal left superficial femoral artery stenosis with three-vessel runoff mid left  anterior tibial disease. At that time he had successful PTA and balloon angioplasty to less than left SFA.   The patient presented to [facility] with shortness of breath and  complaints of chest pain. He states he noticed chest pain with sleep when he lays flat and with exertion. He states it feels like "bone pressing on his chest" centrally. He stated he had radiation to the left arm which he described as a numbness feeling in his hand he also complains of dyspnea on exertion, dry mouth, wheezing, dry cough, lightheadedness, nausea, vomiting and he says his stomach feels hard. The patient drinks half a quart of liquor per day, recent cocaine use and smokes 5-6 cigarettes per day. He is constantly dozing off to sleep during our interview. His wife states that he constantly stops breathing when he sleeping in the evening. He is currently chest pain-free.  EKG shows Afib RVR(108bpm) with ST depression in inferolateral leads.  Pro BNP was elevated at 2291.  Initial troponin was negative he has chronic kidney disease with a creatinine of 1.3. Dr. Rennis Golden saw him and felt left heart cath was recommended secondary to peripheral vascular disease and new onset of atrial fibrillation. Prior to pursuing TEE and cardioversion cardiac anatomy needed to be revealed.  He was also noticed to have thrombocytopenia on admission. His Xarelto was DC'd he was placed on heparin for anticoagulation.    Cardiac catheterization was completed without complications 05/01/2012 and revealed  Essentially angiographically normal coronary arteries.  Severe systemic hypertension with significantly elevated LVEDP the range of 35-55 mmHg.  Left ejection fraction not assessed as LV gram not performed due to elevate EDP in A. fib.  2-D echo is completed 05/02/2012: Left ventricle: The cavity size was  normal. Wall thickness was increased in a pattern of moderate LVH. There was moderate concentric hypertrophy. Systolic function was normal.  The estimated ejection fraction was in the range of 55% to 65%. Although no diagnostic regional wall motion abnormality was identified, this possibility cannot be completely excluded on the basis of this study with Atrial Fibrillation. - Mitral valve: Trivial to mildregurgitation. - Left atrium: The atrium was likely moderately dilated. - Right ventricle: The cavity size was mildly dilated. Wall thickness was normal.  Patient was diuresed during hospitalization with improvement of his diastolic heart failure. Plan was to change Xarelto to Coumadin.  Patient remained on heparin while Coumadin was being dosed.  Plans were made for patient to have TEE cardioversion.  Originally hopes were to do the cardioversion on the 12th but we were unable to get on the schedule it was rescheduled for the 13th. INR was nontherapeutic at this time either, the patient would still need to be hospitalized for Coumadin heparin crossover.  Patient underwent TEE and cardioversion 05/05/2012 by Dr. Royann Shivers.  The cardioversion was successful and patient converted to sinus rhythm. TEE results: Left ventricle: The cavity size was normal. There was concentric hypertrophy. Systolic function was normal. The estimated ejection fraction was in the range of 55% to 60%. Diffuse hypokinesis. No evidence of thrombus. - Mitral valve: Mild regurgitation. - Left atrium: The atrium was mildly dilated. No evidence of thrombus in the atrial cavity or appendage. No spontaneous echo contrast was observed. Impressions:- No cardiac source of emboli was indentified. Successful cardioversion  By 05/06/2012  INR was 1.95, patient's heart failure was resolved.  Patient was seen by Dr. Rennis Golden and felt stable and ready for discharge home. Patient ambulated in the hall without complications.  The patient has had multiple medication changes and the addition of Coumadin instead of Xarelto.  He will followup in Coumadin clinic, on the  peripheral arterial Dopplers, he will need heart failure followup, and sleep study for sleep apnea. Patient does fall asleep while we are talking to him at times and at other times his wife states he stops breathing during the night.   Consults: None  Significant Diagnostic Studies:  Pro BNP on admission 2291  Prior to discharge 513  Cardiac enzymes were negative  Troponin less than 0.30 Hemoglobin A1c 5.4 TSH 1.773 Hepatitis A IgM negative Hepatitis B surface antigen negative Hepatitis B C. IgM negative HCV AB reactive  HIV non-reactive  Serum creatinine was elevated on admission at 1.53 with a discharge 1.08  At discharge sodium 137 potassium 4.0 chloride 103 CO2 24 BUN 19 creatinine 1.08 calcium 8.7 GFR 84 glucose 102 alkaline phosphatase 12 albumin 3.5 AST 53 ALT 72 total protein 6.7 total bilirubin 0.9  Hemoglobin 15.1 hematocrit 44.2 CBC 7.8 platelet 68  Pro time At discharge 22.6 INR 1.95  Abdominal ultrasound: IMPRESSION:  Echogenic stones in the gallbladder with gallbladder contraction  and mild wall thickening. This is a nonspecific appearance but  could be associated with cholecystitis in the appropriate clinical  setting. Bilateral renal parenchymal atrophy. Coarsening of liver  parenchymal echotexture which is nonspecific but could be  associated with cirrhosis.   Portable chest x-ray on admission Findings: The cardiac silhouette, mediastinum, pulmonary  vasculature are within normal limits. Both lungs are clear. There  is stable, chronic elevation of the right hemi diaphragm. The left  costophrenic angle has been cut off the film. There is no acute  bony abnormality  Discharge Exam: Blood pressure 160/80, pulse  90, temperature 98.4 F (36.9 C), temperature source Oral, resp. rate 20, height 6\' 6"  (1.981 m), weight 100.971 kg (222 lb 9.6 oz), SpO2 98.00%.   PE: General:alert and oriented, Pleasant affect  Heart:S1S2 RRRwith occ premature beats  Lungs:clear    Abd:+ BS, soft, non tender  Ext:no edema, + pedal pulses  Disposition: 01-Home or Self Care   Medication List  As of 05/06/2012  7:05 PM   STOP taking these medications         metoprolol 50 MG tablet      XARELTO 10 MG Tabs tablet         TAKE these medications         diltiazem 120 MG tablet   Commonly known as: CARDIZEM   Take 1 tablet (120 mg total) by mouth every 12 (twelve) hours.      DSS 100 MG Caps   Take 100 mg by mouth 2 (two) times daily as needed for constipation.      folic acid 1 MG tablet   Commonly known as: FOLVITE   Take 1 tablet (1 mg total) by mouth daily.      furosemide 40 MG tablet   Commonly known as: LASIX   Take 1 tablet (40 mg total) by mouth 2 (two) times daily.      hydrALAZINE 25 MG tablet   Commonly known as: APRESOLINE   Take 1.5 tablets (37.5 mg total) by mouth every 8 (eight) hours.      hydrocortisone cream 1 %   Apply 1 application topically 3 (three) times daily.      isosorbide mononitrate 30 MG 24 hr tablet   Commonly known as: IMDUR   Take 1 tablet (30 mg total) by mouth daily.      lisinopril 5 MG tablet   Commonly known as: PRINIVIL,ZESTRIL   Take 1 tablet (5 mg total) by mouth daily.      multivitamin with minerals Tabs   Take 1 tablet by mouth daily.      predniSONE 20 MG tablet   Commonly known as: DELTASONE   Take 20 mg by mouth daily.      thiamine 100 MG tablet   Take 1 tablet (100 mg total) by mouth daily.      warfarin 5 MG tablet   Commonly known as: COUMADIN   Take 1.5 tablets (7.5 mg total) by mouth daily.           Follow-up Information    Follow up with Chrystie Nose, MD on 05/08/2012. (for coumadin clinic  at  10:10 am)    Contact information:   [address] [place] [address] [phone]       Follow up with Wilburt Finlay, PA on 05/12/2012. (at 2:20 pm,  Dr. Blanchie Dessert PA )    Contact information:   [address] [place]  [place] [address] [phone]       Follow up with Chrystie Nose, MD on 06/17/2012. (at 10:30 AM)    Contact information:   [address] [place] [address] [phone]       Follow up with Chrystie Nose, MD on 05/08/2012. (for lower extremity arterial doppler's  at 1:30pm  at Dr. Blanchie Dessert office)    Contact information:   [address] [place] [address] [phone]       Follow up with Chrystie Nose, MD. (Our office will call with date and  time for a sleep study)    Contact information:   [address] [place] [address] [phone]         Discharge instructions: Heart Healthy Diet  Weigh daily  Call if your weight increases by 3 pounds in a day or 5 pounds in a week, for medication adjustment  Stop tobacco and alcohol and any medication not prescribed.  No lifting for 3 days to protect cath site  Take 1.5 tabs of coumadin this evening, and tomorrow then you will be instructed on how much to take in the coumadin clinic on Friday.  Take the coumadin in the evenings between 4 and 6 pm   Signed: INGOLD,LAURA R 05/06/2012, 7:05 PM

## 2012-05-06 NOTE — Progress Notes (Signed)
Subjective: No complaints, worried that his BP is up and down  Objective: Vital signs in last 24 hours: Temp:  [97.6 F (36.4 C)-98.7 F (37.1 C)] 98.2 F (36.8 C) (08/14 0636) Pulse Rate:  [70-97] 73  (08/14 0949) Resp:  [18-20] 18  (08/14 0636) BP: (99-153)/(68-85) 138/81 mmHg (08/14 0949) SpO2:  [92 %-99 %] 98 % (08/14 0636) Weight:  [100.971 kg (222 lb 9.6 oz)] 100.971 kg (222 lb 9.6 oz) (08/14 0636) Weight change: 0.771 kg (1 lb 11.2 oz) Last BM Date: 05/04/12 Intake/Output from previous day: -315  Wt. 100.97 08/13 0701 - 08/14 0700 In: 2000 [P.O.:920; I.V.:1080] Out: 2150 [Urine:2150] Intake/Output this shift: Total I/O In: 3 [I.V.:3] Out: -   PE: General:alert and oriented, Pleasant affect Heart:S1S2 RRRwith occ premature beats Lungs:clear Abd:+ BS, soft, non tender Ext:no edema, + pedal pulses    Lab Results:  Basename 05/06/12 0653 05/05/12 0511  WBC 7.8 13.7*  HGB 15.1 15.9  HCT 44.2 45.9  PLT 68* 83*   BMET  Basename 05/06/12 0653 05/05/12 0511  NA 137 135  K 4.0 4.1  CL 103 100  CO2 24 24  GLUCOSE 102* 108*  BUN 19 24*  CREATININE 1.08 1.21  CALCIUM 8.7 8.9   No results found for this basename: TROPONINI:2,CK,MB:2 in the last 72 hours  Lab Results  Component Value Date   CHOL 136 05/01/2012   HDL 58 05/01/2012   LDLCALC 58 05/01/2012   TRIG 100 05/01/2012   CHOLHDL 2.3 05/01/2012   Lab Results  Component Value Date   HGBA1C 5.4 04/30/2012     Lab Results  Component Value Date   TSH 1.773 04/30/2012    Hepatic Function Panel  Basename 05/06/12 0653  PROT 6.7  ALBUMIN 3.5  AST 53*  ALT 72*  ALKPHOS 112  BILITOT 0.9  BILIDIR --  IBILI --   No results found for this basename: CHOL in the last 72 hours No results found for this basename: PROTIME in the last 72 hours    EKG: Orders placed during the hospital encounter of 04/30/12  . ED EKG  . ED EKG  . EKG 12-LEAD  . EKG 12-LEAD  . EKG 12-LEAD  . EKG 12-LEAD  . EKG 12-LEAD  . EKG  12-LEAD  . EKG  . EKG 12-LEAD  . EKG 12-LEAD  . EKG 12-LEAD  . EKG 12-LEAD    Studies/Results: TEE and DCCV 05/05/12   - Left ventricle: The cavity size was normal. There was concentric hypertrophy. Systolic function was normal. The estimated ejection fraction was in the range of 55% to 60%. Diffuse hypokinesis. No evidence of thrombus. - Mitral valve: Mild regurgitation. - Left atrium: The atrium was mildly dilated. No evidence of thrombus in the atrial cavity or appendage. No spontaneous echo contrast was observed. Impressions:  - No cardiac source of emboli was indentified. Successful cardioversion   Medications: I have reviewed the patient's current medications.    Marland Kitchen diltiazem  120 mg Oral Q12H  . docusate sodium  100 mg Oral BID  . folic acid  1 mg Oral Daily  . furosemide  40 mg Oral BID  . hydrALAZINE  37.5 mg Oral Q8H  . hydrocortisone cream  1 application Topical TID  . isosorbide mononitrate  30 mg Oral Daily  . lisinopril  5 mg Oral Daily  . magnesium hydroxide  15 mL Oral Once  . multivitamin with minerals  1 tablet Oral Daily  . polyethylene glycol  17 g Oral BID  . predniSONE  20 mg Oral Daily  . sodium chloride  3 mL Intravenous Q12H  . thiamine  100 mg Oral Daily  . warfarin  7.5 mg Oral ONCE-1800  . warfarin  7.5 mg Oral ONCE-1800  . Warfarin - Pharmacist Dosing Inpatient   Does not apply q1800   Assessment/Plan: Principal Problem:  *Acute diastolic HF (heart failure), NYHA class 3 - exaccerbated by Afib with RVR Active Problems:  Chest pain, no significant macrovascular CAD at cath 05/01/12; Likely due to elevated LVEDP & Afib with Diastolic HF.  SOB (shortness of breath)  Atrial fibrillation with RVR, converted to SR with TEE and DCCV 05/05/12  PAD (peripheral artery disease), PTA to Lt. SFA in 2009, total occlusion mid Rt SFA with collaterals   Cocaine abuse  HTN (hypertension) -Accelerated with End organ involvement  Tobacco abuse  ETOH  abuse  Elevated LFTs  Acute renal insufficiency - resolved after BP control & diuresis  Cholelithiases  Obesity  Chronic anticoagulation, recently started on Xarelto for AF; now On Warfarin  NSVT (nonsustained ventricular tachycardia)  Thrombocytopenia on admission 04/30/12, thought to be secondary to Xarelto  PLAN: plts, continue low,  INR 1.95,  Plan to keep one more day for hep./coumadin crossover.  Awaiting therapeutic INR.  BP is labile Maintaining SR.    LOS: 6 days   INGOLD,LAURA R 05/06/2012, 10:54 AM

## 2012-05-06 NOTE — Progress Notes (Signed)
ANTICOAGULATION CONSULT NOTE - Follow Up Consult  Pharmacy Consult for Heparin + Coumadin (Xarelto stopped) Indication: atrial fibrillation  No Known Allergies  Vital Signs: Temp: 98.2 F (36.8 C) (08/14 0636) Temp src: Oral (08/14 0636) BP: 153/85 mmHg (08/14 0636) Pulse Rate: 70  (08/14 0636)  Labs:  Basename 05/06/12 0653 05/05/12 0511 05/04/12 0540  HGB 15.1 15.9 --  HCT 44.2 45.9 43.6  PLT 68* 83* 84*  APTT -- -- --  LABPROT 22.6* 18.9* 16.3*  INR 1.95* 1.55* 1.29  HEPARINUNFRC 0.50 0.60 0.64  CREATININE 1.08 1.21 1.02  CKTOTAL -- -- --  CKMB -- -- --  TROPONINI -- -- --    Estimated Creatinine Clearance: 94 ml/min (by C-G formula based on Cr of 1.08).  Medications:  Heparin @ 1500 units/hr  Assessment: 61yom on xarelto pta for afib, continues his transition to coumadin with heparin bridge. Heparin level is therapeutic. INR is almost at goal, trending up appropriately with 7.5mg  doses. Hgb/Hct remain stable, platelets decreased today. No bleeding noted.  Goal of Therapy:  INR 2-3 Heparin level 0.3-0.7 Monitor platelets by anticoagulation protocol: Yes   Plan:  1) Continue heparin at 1500 units/hr 2) Repeat coumadin 7.5mg  x 1 3) Follow up INR, heparin level, CBC in AM  Fredrik Rigger 05/06/2012,9:13 AM

## 2012-05-06 NOTE — Progress Notes (Signed)
Pt. Seen and examined. Agree with the NP/PA-C note as written.  Clinically stable today. Remains in sinus. INR was 1.95 this morning, therefore he is essentially at goal. Wants to go home today. I think this is okay. Plan re-check INR in our office on Friday of this week and adjustments to coumadin as necessary. Follow-up with me or a mid-level provider in 1-2 weeks.    Chrystie Nose, MD, [facility] Attending Cardiologist [facility]

## 2013-06-23 ENCOUNTER — Ambulatory Visit (INDEPENDENT_AMBULATORY_CARE_PROVIDER_SITE_OTHER): Payer: BC Managed Care – PPO | Admitting: Family Medicine

## 2013-06-23 ENCOUNTER — Ambulatory Visit: Payer: BC Managed Care – PPO

## 2013-06-23 VITALS — BP 152/92 | HR 72 | Temp 98.0°F | Resp 17 | Ht 71.0 in | Wt 220.0 lb

## 2013-06-23 DIAGNOSIS — I5031 Acute diastolic (congestive) heart failure: Secondary | ICD-10-CM

## 2013-06-23 DIAGNOSIS — R0602 Shortness of breath: Secondary | ICD-10-CM

## 2013-06-23 DIAGNOSIS — Z720 Tobacco use: Secondary | ICD-10-CM

## 2013-06-23 DIAGNOSIS — J3489 Other specified disorders of nose and nasal sinuses: Secondary | ICD-10-CM

## 2013-06-23 DIAGNOSIS — I10 Essential (primary) hypertension: Secondary | ICD-10-CM

## 2013-06-23 DIAGNOSIS — R0981 Nasal congestion: Secondary | ICD-10-CM

## 2013-06-23 DIAGNOSIS — D696 Thrombocytopenia, unspecified: Secondary | ICD-10-CM

## 2013-06-23 DIAGNOSIS — N289 Disorder of kidney and ureter, unspecified: Secondary | ICD-10-CM

## 2013-06-23 DIAGNOSIS — F17200 Nicotine dependence, unspecified, uncomplicated: Secondary | ICD-10-CM

## 2013-06-23 LAB — COMPREHENSIVE METABOLIC PANEL
ALT: 35 U/L (ref 0–53)
AST: 28 U/L (ref 0–37)
Albumin: 4.3 g/dL (ref 3.5–5.2)
Alkaline Phosphatase: 95 U/L (ref 39–117)
BUN: 21 mg/dL (ref 6–23)
CO2: 24 mEq/L (ref 19–32)
Calcium: 9 mg/dL (ref 8.4–10.5)
Chloride: 102 mEq/L (ref 96–112)
Creat: 1.45 mg/dL — ABNORMAL HIGH (ref 0.50–1.35)
Glucose, Bld: 101 mg/dL — ABNORMAL HIGH (ref 70–99)
Potassium: 4.2 mEq/L (ref 3.5–5.3)
Sodium: 137 mEq/L (ref 135–145)
Total Bilirubin: 1.2 mg/dL (ref 0.3–1.2)
Total Protein: 7.6 g/dL (ref 6.0–8.3)

## 2013-06-23 LAB — GLUCOSE, POCT (MANUAL RESULT ENTRY): POC Glucose: 105 mg/dl — AB (ref 70–99)

## 2013-06-23 LAB — POCT CBC
Granulocyte percent: 83.3 %G — AB (ref 37–80)
HCT, POC: 53.3 % (ref 43.5–53.7)
Hemoglobin: 17.2 g/dL (ref 14.1–18.1)
Lymph, poc: 0.8 (ref 0.6–3.4)
MCH, POC: 34.3 pg — AB (ref 27–31.2)
MCHC: 32.3 g/dL (ref 31.8–35.4)
MCV: 106.3 fL — AB (ref 80–97)
MID (cbc): 0.7 (ref 0–0.9)
MPV: 10.9 fL (ref 0–99.8)
POC Granulocyte: 7.2 — AB (ref 2–6.9)
POC LYMPH PERCENT: 9.2 %L — AB (ref 10–50)
POC MID %: 7.5 %M (ref 0–12)
Platelet Count, POC: 67 10*3/uL — AB (ref 142–424)
RBC: 5.01 M/uL (ref 4.69–6.13)
RDW, POC: 13.2 %
WBC: 8.7 10*3/uL (ref 4.6–10.2)

## 2013-06-23 LAB — LIPID PANEL
Cholesterol: 154 mg/dL (ref 0–200)
HDL: 41 mg/dL (ref >39)
LDL Cholesterol: 97 mg/dL (ref 0–99)
Total CHOL/HDL Ratio: 3.8 Ratio
Triglycerides: 78 mg/dL (ref <150)
VLDL: 16 mg/dL (ref 0–40)

## 2013-06-23 MED ORDER — DILTIAZEM HCL 120 MG PO TABS: 120.0000 mg | ORAL_TABLET | Freq: Two times a day (BID) | ORAL | Status: DC

## 2013-06-23 MED ORDER — IPRATROPIUM BROMIDE 0.03 % NA SOLN: 2.0000 | Freq: Two times a day (BID) | NASAL | Status: DC

## 2013-06-23 MED ORDER — AMOXICILLIN 875 MG PO TABS: 875.0000 mg | ORAL_TABLET | Freq: Two times a day (BID) | ORAL | Status: DC

## 2013-06-23 MED ORDER — BENZONATATE 100 MG PO CAPS: 100.0000 mg | ORAL_CAPSULE | Freq: Three times a day (TID) | ORAL | Status: DC | PRN

## 2013-06-23 NOTE — Patient Instructions (Addendum)
You need to see your primary care provider in the next 48 hours. You also need to see your cardiologist, probably within the next week.  I've refilled the cardizem, for your blood pressure and your heart. Your primary care provider and cardiologist may restart the other medications.  Your platelets are low, which increases your risk for bleeding.  You need to cut back on your alcohol to no more than 3 drinks each day, preferably less. Please stop smoking.

## 2013-06-23 NOTE — Progress Notes (Signed)
Subjective:    Patient ID: Kevin [name], male    DOB: 1950-10-30, 62 y.o.   MRN: [id number]  HPI This 62 y.o. male presents for evaluation of :  Chief Complaint  Patient presents with  . Nasal Congestion  . Shortness of Breath    Started with nasal congestion, then developed shortness of breath.  Feels short-winded.  Has had a little sore throat.  Coughing, non-productive. "Sharp-pain HA," (points to the RIGHT forehead.  "I had a chills once."  SOB worse with trying to sleep, lying down is worse, and "I toss and turn." He reports that his breathing difficulty seems to be in his chest, but that when he blows his nose and clears out the mucous, his breathing is much better.  PCP: Jackie Plum, MD  Active Ambulatory Problems    Diagnosis Date Noted  . Chest pain, no significant macrovascular CAD at cath 05/01/12; Likely due to elevated LVEDP & Afib with Diastolic HF. 04/30/2012  . SOB (shortness of breath) 04/30/2012  . Atrial fibrillation with RVR, converted to SR with TEE and DCCV 05/05/12 04/30/2012  . PAD (peripheral artery disease), PTA to Lt. SFA in 2009, total occlusion mid Rt SFA with collaterals  04/30/2012  . Cocaine abuse 04/30/2012  . HTN (hypertension) -Accelerated with End organ involvement 04/30/2012  . Tobacco abuse 04/30/2012  . ETOH abuse 04/30/2012  . Elevated LFTs 04/30/2012  . Acute renal insufficiency - resolved after BP control & diuresis 04/30/2012  . Cholelithiases 05/01/2012  . Obesity 05/02/2012  . Chronic anticoagulation, recently started on Xarelto for AF; now On Warfarin 05/02/2012  . NSVT (nonsustained ventricular tachycardia) 05/02/2012  . Thrombocytopenia on admission 04/30/12, thought to be secondary to Xarelto, continues at discharge 05/02/2012  . Acute diastolic HF (heart failure), NYHA class 3 - exaccerbated by Afib with RVR 05/02/2012   Past Medical History  Diagnosis Date  . Hypertension   . Dysrhythmia   . CHF (congestive heart failure)    . Blood transfusion without reported diagnosis     Past Surgical History  Procedure Laterality Date  . Endovascular stent insertion  right leg  . Tee without cardioversion  05/05/2012    Procedure: TRANSESOPHAGEAL ECHOCARDIOGRAM (TEE);  Surgeon: Thurmon Fair, MD;  Location: [facility] ENDOSCOPY;  Service: Cardiovascular;  Laterality: N/A;  . Cardioversion  05/05/2012    Procedure: CARDIOVERSION;  Surgeon: Thurmon Fair, MD;  Location: [facility] ENDOSCOPY;  Service: Cardiovascular;  Laterality: N/A;    No Known Allergies  Prior to Admission medications   Medication Sig Start Date End Date Taking? Authorizing Provider  diltiazem (CARDIZEM) 120 MG tablet Take 1 tablet (120 mg total) by mouth every 12 (twelve) hours. 05/06/12 06/23/13 NO Nada Boozer, NP  furosemide (LASIX) 40 MG tablet Take 1 tablet (40 mg total) by mouth 2 (two) times daily. 05/06/12 06/23/13 NO Nada Boozer, NP  hydrALAZINE (APRESOLINE) 25 MG tablet Take 1.5 tablets (37.5 mg total) by mouth every 8 (eight) hours. 05/06/12 06/23/13 NO Nada Boozer, NP  hydrocortisone cream 1 % Apply 1 application topically 3 (three) times daily.   NO Historical Provider, MD  isosorbide mononitrate (IMDUR) 30 MG 24 hr tablet Take 1 tablet (30 mg total) by mouth daily. 05/06/12 06/23/13 NO Nada Boozer, NP  lisinopril (PRINIVIL,ZESTRIL) 5 MG tablet Take 1 tablet (5 mg total) by mouth daily. 05/06/12 06/23/13 NO Nada Boozer, NP  Multiple Vitamin (MULTIVITAMIN WITH MINERALS) TABS Take 1 tablet by mouth daily. 05/06/12  NO Nada Boozer, NP  predniSONE (DELTASONE) 20  MG tablet Take 20 mg by mouth daily.   NO Historical Provider, MD  warfarin (COUMADIN) 5 MG tablet Take 1.5 tablets (7.5 mg total) by mouth daily. 05/06/12 06/23/13 NO Nada Boozer, NP   Patient reports that he stopped all his medications about 8-9 months ago, he simply didn't go back tot he pharmacy to pick them up. He reports that he hasn't been seen by any healthcare provider in 8-9 months.  History    Social History  . Marital Status: Married    Spouse Name: Stefano Gaul    Number of Children: 5  . Years of Education: 12   Occupational History  . [employer]   Social History Main Topics  . Smoking status: Current Every Day Smoker -- 0.25 packs/day for 40 years    Types: Cigarettes  . Smokeless tobacco: Never Used  . Alcohol Use: 0 - 8 oz/week    0-16 drink(s) per week     Comment: He drinks up to 1/2 pint daily; 6 beers  . Drug Use: No     Comment: previous cocaine use 2013  . Sexual Activity: Yes    Partners: Female   Other Topics Concern  . None   Social History Narrative   Lives with his wife.  Was raised by a non-related family from the age of 14 years, and so knows very little about his biological family history.   History  Sexual Activity  . Sexual Activity: Yes  . Partners: Female   family history includes Seizures in his daughter. indicated that his mother is deceased. He indicated that his father is deceased. He indicated that all of his four sisters are alive. He indicated that both of his daughters are alive. He indicated that all of his three sons are alive.     Review of Systems As above.  No CP, HA, dizziness, GI/GU symptoms.  No vision change.  No palpitations.  No LE edema.    Objective:   Physical Exam  Constitutional: He is oriented to person, place, and time. Vital signs are normal. He appears well-developed and well-nourished. He is active and cooperative. No distress.  HENT:  Head: Normocephalic and atraumatic.  Right Ear: Hearing normal.  Left Ear: Hearing normal.  Eyes: EOM are normal. Pupils are equal, round, and reactive to light.  Neck: Normal range of motion. Neck supple. No thyromegaly present.  Cardiovascular: Normal rate, regular rhythm and normal heart sounds.   Pulses:      Radial pulses are 2+ on the right side, and 2+ on the left side.       Dorsalis pedis pulses are 2+ on the right side, and 2+ on the left side.        Posterior tibial pulses are 2+ on the right side, and 2+ on the left side.  Pulmonary/Chest: Effort normal and breath sounds normal.  Lymphadenopathy:       Head (right side): No tonsillar, no preauricular, no posterior auricular and no occipital adenopathy present.       Head (left side): No tonsillar, no preauricular, no posterior auricular and no occipital adenopathy present.    He has no cervical adenopathy.       Right: No supraclavicular adenopathy present.       Left: No supraclavicular adenopathy present.  Neurological: He is alert and oriented to person, place, and time. No sensory deficit.  Skin: Skin is warm, dry and intact. No rash noted. No cyanosis or erythema. Nails show no clubbing.  Psychiatric: He has a normal mood and affect.   CXR: [facility] reading (PRIMARY) by  Dr. Katrinka Blazing.  Mild cardiomegaly.  No infiltrates.  Elevated RIGHT hemidiaphragm.  EKG: Reviewed with Dr. Katrinka Blazing.  Tachycardia.  AFib.  No acute ischemia.     Assessment & Plan:  Congestion of nasal sinus - I suspect that this patient has a viral URI, but given his cardiac history and smoking, prefer to cover him for bacterial sinusitis.  Plan: benzonatate (TESSALON) 100 MG capsule, ipratropium (ATROVENT) 0.03 % nasal spray, amoxicillin (AMOXIL) 875 MG tablet  SOB (shortness of breath) - It is difficult to discern whether this issue is nasal or due to tachycardia/AFib. He does not appear to be in acute CHF today. Plan: POCT CBC, POCT glucose (manual entry), DG Chest 2 View, EKG 12-Lead.  HTN (hypertension) -Accelerated with End organ involvement - Off medications x 8-9 months. Plan: Comprehensive metabolic panel, Lipid panel, RESTART diltiazem (CARDIZEM) 120 MG tablet  Tobacco abuse - encouraged smoking cessation.  Thrombocytopenia -was thought to be related to Xarelto when noted during his admission in 2013.  At some point he was switched to warfarin, but hasn't been on that in 8-9 months.  Most likely related to  liver disease due to continued alcohol consumption.  Encouraged to cut back and quit. Will need additional evaluation and follow-up.  I called the patient's PCP office.  He will see [name], PA on Friday 06/25/2013 at 2:30 pm.  I believe he also needs a follow-up with cardiology, likely in the next week.  Fernande Bras, PA-C Physician Assistant-Certified [facility]

## 2014-04-19 ENCOUNTER — Inpatient Hospital Stay (HOSPITAL_COMMUNITY)
Admission: EM | Admit: 2014-04-19 | Discharge: 2014-04-23 | DRG: 308 | Disposition: A | Discharge status: Home / Self Care | Payer: BC Managed Care – PPO | Attending: Internal Medicine | Admitting: Internal Medicine

## 2014-04-19 ENCOUNTER — Emergency Department (HOSPITAL_COMMUNITY): Payer: BC Managed Care – PPO

## 2014-04-19 ENCOUNTER — Encounter (HOSPITAL_COMMUNITY): Payer: Self-pay | Admitting: Emergency Medicine

## 2014-04-19 DIAGNOSIS — B1920 Unspecified viral hepatitis C without hepatic coma: Secondary | ICD-10-CM | POA: Diagnosis present

## 2014-04-19 DIAGNOSIS — F1010 Alcohol abuse, uncomplicated: Secondary | ICD-10-CM

## 2014-04-19 DIAGNOSIS — R0609 Other forms of dyspnea: Secondary | ICD-10-CM

## 2014-04-19 DIAGNOSIS — Y9210 Unspecified residential institution as the place of occurrence of the external cause: Secondary | ICD-10-CM | POA: Diagnosis not present

## 2014-04-19 DIAGNOSIS — T502X5A Adverse effect of carbonic-anhydrase inhibitors, benzothiadiazides and other diuretics, initial encounter: Secondary | ICD-10-CM | POA: Diagnosis not present

## 2014-04-19 DIAGNOSIS — R0602 Shortness of breath: Secondary | ICD-10-CM

## 2014-04-19 DIAGNOSIS — I129 Hypertensive chronic kidney disease with stage 1 through stage 4 chronic kidney disease, or unspecified chronic kidney disease: Secondary | ICD-10-CM | POA: Diagnosis present

## 2014-04-19 DIAGNOSIS — I4891 Unspecified atrial fibrillation: Principal | ICD-10-CM | POA: Diagnosis present

## 2014-04-19 DIAGNOSIS — Z9119 Patient's noncompliance with other medical treatment and regimen: Secondary | ICD-10-CM

## 2014-04-19 DIAGNOSIS — I472 Ventricular tachycardia: Secondary | ICD-10-CM

## 2014-04-19 DIAGNOSIS — N289 Disorder of kidney and ureter, unspecified: Secondary | ICD-10-CM

## 2014-04-19 DIAGNOSIS — E669 Obesity, unspecified: Secondary | ICD-10-CM

## 2014-04-19 DIAGNOSIS — I480 Paroxysmal atrial fibrillation: Secondary | ICD-10-CM

## 2014-04-19 DIAGNOSIS — I10 Essential (primary) hypertension: Secondary | ICD-10-CM

## 2014-04-19 DIAGNOSIS — K7460 Unspecified cirrhosis of liver: Secondary | ICD-10-CM

## 2014-04-19 DIAGNOSIS — F1910 Other psychoactive substance abuse, uncomplicated: Secondary | ICD-10-CM

## 2014-04-19 DIAGNOSIS — R0600 Dyspnea, unspecified: Secondary | ICD-10-CM

## 2014-04-19 DIAGNOSIS — F1020 Alcohol dependence, uncomplicated: Secondary | ICD-10-CM | POA: Diagnosis present

## 2014-04-19 DIAGNOSIS — I509 Heart failure, unspecified: Secondary | ICD-10-CM

## 2014-04-19 DIAGNOSIS — F1410 Cocaine abuse, uncomplicated: Secondary | ICD-10-CM

## 2014-04-19 DIAGNOSIS — D696 Thrombocytopenia, unspecified: Secondary | ICD-10-CM

## 2014-04-19 DIAGNOSIS — R1310 Dysphagia, unspecified: Secondary | ICD-10-CM

## 2014-04-19 DIAGNOSIS — Z7901 Long term (current) use of anticoagulants: Secondary | ICD-10-CM

## 2014-04-19 DIAGNOSIS — I739 Peripheral vascular disease, unspecified: Secondary | ICD-10-CM

## 2014-04-19 DIAGNOSIS — R894 Abnormal immunological findings in specimens from other organs, systems and tissues: Secondary | ICD-10-CM

## 2014-04-19 DIAGNOSIS — I4729 Other ventricular tachycardia: Secondary | ICD-10-CM

## 2014-04-19 DIAGNOSIS — N189 Chronic kidney disease, unspecified: Secondary | ICD-10-CM | POA: Diagnosis present

## 2014-04-19 DIAGNOSIS — K7030 Alcoholic cirrhosis of liver without ascites: Secondary | ICD-10-CM | POA: Diagnosis present

## 2014-04-19 DIAGNOSIS — R945 Abnormal results of liver function studies: Secondary | ICD-10-CM

## 2014-04-19 DIAGNOSIS — F1210 Cannabis abuse, uncomplicated: Secondary | ICD-10-CM | POA: Diagnosis present

## 2014-04-19 DIAGNOSIS — I5033 Acute on chronic diastolic (congestive) heart failure: Secondary | ICD-10-CM

## 2014-04-19 DIAGNOSIS — I5031 Acute diastolic (congestive) heart failure: Secondary | ICD-10-CM

## 2014-04-19 DIAGNOSIS — Z91199 Patient's noncompliance with other medical treatment and regimen due to unspecified reason: Secondary | ICD-10-CM

## 2014-04-19 DIAGNOSIS — R7989 Other specified abnormal findings of blood chemistry: Secondary | ICD-10-CM

## 2014-04-19 DIAGNOSIS — N179 Acute kidney failure, unspecified: Secondary | ICD-10-CM | POA: Diagnosis not present

## 2014-04-19 DIAGNOSIS — R768 Other specified abnormal immunological findings in serum: Secondary | ICD-10-CM

## 2014-04-19 DIAGNOSIS — Z87891 Personal history of nicotine dependence: Secondary | ICD-10-CM

## 2014-04-19 DIAGNOSIS — R079 Chest pain, unspecified: Secondary | ICD-10-CM

## 2014-04-19 DIAGNOSIS — Z720 Tobacco use: Secondary | ICD-10-CM

## 2014-04-19 HISTORY — DX: Other specified abnormal immunological findings in serum: R76.8

## 2014-04-19 LAB — PROTIME-INR
INR: 1.15 (ref 0.00–1.49)
Prothrombin Time: 14.7 s (ref 11.6–15.2)

## 2014-04-19 LAB — COMPREHENSIVE METABOLIC PANEL
ALT: 26 U/L (ref 0–53)
AST: 29 U/L (ref 0–37)
Albumin: 3.7 g/dL (ref 3.5–5.2)
Alkaline Phosphatase: 87 U/L (ref 39–117)
Anion gap: 16 — ABNORMAL HIGH (ref 5–15)
BUN: 19 mg/dL (ref 6–23)
CO2: 21 mEq/L (ref 19–32)
Calcium: 8.9 mg/dL (ref 8.4–10.5)
Chloride: 102 mEq/L (ref 96–112)
Creatinine, Ser: 1.37 mg/dL — ABNORMAL HIGH (ref 0.50–1.35)
GFR calc Af Amer: 62 mL/min — ABNORMAL LOW (ref >90)
GFR calc non Af Amer: 54 mL/min — ABNORMAL LOW (ref >90)
Glucose, Bld: 115 mg/dL — ABNORMAL HIGH (ref 70–99)
Potassium: 3.6 mEq/L — ABNORMAL LOW (ref 3.7–5.3)
Sodium: 139 mEq/L (ref 137–147)
Total Bilirubin: 1.3 mg/dL — ABNORMAL HIGH (ref 0.3–1.2)
Total Protein: 7 g/dL (ref 6.0–8.3)

## 2014-04-19 LAB — RAPID URINE DRUG SCREEN, HOSP PERFORMED
Amphetamines: NOT DETECTED
Barbiturates: NOT DETECTED
Benzodiazepines: NOT DETECTED
Cocaine: POSITIVE — AB
Opiates: NOT DETECTED
Tetrahydrocannabinol: POSITIVE — AB

## 2014-04-19 LAB — MAGNESIUM: Magnesium: 1.8 mg/dL (ref 1.5–2.5)

## 2014-04-19 LAB — CBC WITH DIFFERENTIAL/PLATELET
Basophils Absolute: 0 10*3/uL (ref 0.0–0.1)
Basophils Relative: 0 % (ref 0–1)
Eosinophils Absolute: 0.1 10*3/uL (ref 0.0–0.7)
Eosinophils Relative: 1 % (ref 0–5)
HCT: 41.1 % (ref 39.0–52.0)
Hemoglobin: 13.9 g/dL (ref 13.0–17.0)
Lymphocytes Relative: 11 % — ABNORMAL LOW (ref 12–46)
Lymphs Abs: 1 10*3/uL (ref 0.7–4.0)
MCH: 33.6 pg (ref 26.0–34.0)
MCHC: 33.8 g/dL (ref 30.0–36.0)
MCV: 99.3 fL (ref 78.0–100.0)
Monocytes Absolute: 0.4 10*3/uL (ref 0.1–1.0)
Monocytes Relative: 5 % (ref 3–12)
Neutro Abs: 7.6 10*3/uL (ref 1.7–7.7)
Neutrophils Relative %: 83 % — ABNORMAL HIGH (ref 43–77)
Platelets: 62 10*3/uL — ABNORMAL LOW (ref 150–400)
RBC: 4.14 MIL/uL — ABNORMAL LOW (ref 4.22–5.81)
RDW: 12.1 % (ref 11.5–15.5)
WBC: 9.2 10*3/uL (ref 4.0–10.5)

## 2014-04-19 LAB — I-STAT TROPONIN, ED: Troponin i, poc: 0.03 ng/mL (ref 0.00–0.08)

## 2014-04-19 LAB — MRSA PCR SCREENING: MRSA by PCR: NEGATIVE

## 2014-04-19 LAB — TROPONIN I
Troponin I: <0.3 ng/mL (ref <0.30)
Troponin I: <0.3 ng/mL (ref <0.30)

## 2014-04-19 LAB — HEPARIN LEVEL (UNFRACTIONATED): Heparin Unfractionated: 0.1 IU/mL — ABNORMAL LOW (ref 0.30–0.70)

## 2014-04-19 LAB — TSH: TSH: 4.22 u[IU]/mL (ref 0.350–4.500)

## 2014-04-19 LAB — PRO B NATRIURETIC PEPTIDE: Pro B Natriuretic peptide (BNP): 4629 pg/mL — ABNORMAL HIGH (ref 0–125)

## 2014-04-19 MED ORDER — DILTIAZEM LOAD VIA INFUSION
15.0000 mg | Freq: Once | INTRAVENOUS | Status: AC
Start: 2014-04-19 — End: 2014-04-19
  Administered 2014-04-19: 15 mg via INTRAVENOUS
  Filled 2014-04-19: qty 15

## 2014-04-19 MED ORDER — ACETAMINOPHEN 650 MG RE SUPP: 650.0000 mg | Freq: Four times a day (QID) | RECTAL | Status: DC | PRN

## 2014-04-19 MED ORDER — HEPARIN (PORCINE) IN NACL 100-0.45 UNIT/ML-% IJ SOLN
1450.0000 [IU]/h | INTRAMUSCULAR | Status: DC
  Administered 2014-04-19: 1250 [IU]/h via INTRAVENOUS
  Administered 2014-04-19 – 2014-04-20 (×2): 1450 [IU]/h via INTRAVENOUS
  Filled 2014-04-19 (×4): qty 250

## 2014-04-19 MED ORDER — FUROSEMIDE 10 MG/ML IJ SOLN
40.0000 mg | Freq: Two times a day (BID) | INTRAMUSCULAR | Status: DC
  Administered 2014-04-19: 40 mg via INTRAVENOUS
  Filled 2014-04-19: qty 4

## 2014-04-19 MED ORDER — DILTIAZEM HCL 100 MG IV SOLR
5.0000 mg/h | INTRAVENOUS | Status: DC
  Administered 2014-04-19: 5 mg/h via INTRAVENOUS
  Administered 2014-04-20: 10 mg/h via INTRAVENOUS
  Filled 2014-04-19 (×3): qty 100

## 2014-04-19 MED ORDER — CARVEDILOL 6.25 MG PO TABS
6.2500 mg | ORAL_TABLET | Freq: Two times a day (BID) | ORAL | Status: DC
  Administered 2014-04-19 – 2014-04-20 (×2): 6.25 mg via ORAL
  Filled 2014-04-19 (×4): qty 1

## 2014-04-19 MED ORDER — HYDRALAZINE HCL 20 MG/ML IJ SOLN
10.0000 mg | INTRAMUSCULAR | Status: DC | PRN
  Administered 2014-04-19 – 2014-04-20 (×2): 10 mg via INTRAVENOUS
  Filled 2014-04-19 (×2): qty 1

## 2014-04-19 MED ORDER — HEPARIN BOLUS VIA INFUSION
2000.0000 [IU] | Freq: Once | INTRAVENOUS | Status: AC
  Administered 2014-04-19: 2000 [IU] via INTRAVENOUS
  Filled 2014-04-19: qty 2000

## 2014-04-19 MED ORDER — ONDANSETRON HCL 4 MG/2ML IJ SOLN: 4.0000 mg | Freq: Four times a day (QID) | INTRAMUSCULAR | Status: DC | PRN

## 2014-04-19 MED ORDER — ACETAMINOPHEN 325 MG PO TABS
650.0000 mg | ORAL_TABLET | Freq: Four times a day (QID) | ORAL | Status: DC | PRN
  Administered 2014-04-21: 650 mg via ORAL
  Filled 2014-04-19: qty 2

## 2014-04-19 MED ORDER — SODIUM CHLORIDE 0.9 % IJ SOLN
3.0000 mL | Freq: Two times a day (BID) | INTRAMUSCULAR | Status: DC
  Administered 2014-04-19 – 2014-04-22 (×5): 3 mL via INTRAVENOUS

## 2014-04-19 MED ORDER — POTASSIUM CHLORIDE 20 MEQ/15ML (10%) PO LIQD
20.0000 meq | Freq: Once | ORAL | Status: AC
  Administered 2014-04-19: 20 meq via ORAL
  Filled 2014-04-19: qty 15

## 2014-04-19 MED ORDER — POTASSIUM CHLORIDE 20 MEQ/15ML (10%) PO LIQD
20.0000 meq | Freq: Two times a day (BID) | ORAL | Status: DC
  Administered 2014-04-19 – 2014-04-21 (×5): 20 meq via ORAL
  Filled 2014-04-19 (×7): qty 15

## 2014-04-19 MED ORDER — ONDANSETRON HCL 4 MG PO TABS: 4.0000 mg | ORAL_TABLET | Freq: Four times a day (QID) | ORAL | Status: DC | PRN

## 2014-04-19 MED ORDER — FUROSEMIDE 10 MG/ML IJ SOLN
40.0000 mg | Freq: Two times a day (BID) | INTRAMUSCULAR | Status: DC
  Administered 2014-04-19 – 2014-04-20 (×3): 40 mg via INTRAVENOUS
  Filled 2014-04-19 (×5): qty 4

## 2014-04-19 MED ORDER — ALBUTEROL SULFATE (2.5 MG/3ML) 0.083% IN NEBU: 2.5000 mg | INHALATION_SOLUTION | RESPIRATORY_TRACT | Status: DC | PRN

## 2014-04-19 NOTE — Consult Note (Addendum)
Cardiology Consult Note   Patient ID: Kevin [name] MRN: [id number], DOB/AGE: [birth year] 63 y.o. Date of Encounter: 04/19/2014  Primary Physician: Jackie Plum, MD Primary Cardiologist: Dr. Rennis Golden  Chief Complaint:  SOB, chest pain   HPI: Kevin [name] is a 63 y.o. male with no history of CAD, negative CL 2008, cath in 2013 w/ luminal irregularities, LVEDP 35-55, EF 55-65%. He has a history of PVD (LE cath in 2009 with R-SFA, Popliteal and Tibial Peroneal totaled, L-SFA stenosis with 3V runoff, balloon angioplasty of the L-SFA),  afib/RVR req TEE/DCCV in 2013, HTN, non-compliance w/ Rx, cocaine/THC abuse, thrombocytopenia, abnl LFTs, NSVT. He has not been on any medications for a long time because he says he cannot swallow the pills.   He has been having DOE, PND, probable orthopnea x 1 month or more. He cannot walk up a flight of steps without stopping. He denies LE edema, weight gain (has lost weight). His abdominal girth is decreased. He feels he is eating OK. He does not get chest pain with exertion, just SOB. However, when he wakes at night due to SOB, he will have some upper left chest pain that he does not feel is pleuritic and resolves when he sits up and takes deep breaths. He also had some chest pain today that is more pleuritic in nature. He has done cocaine recently and THC, but has completely stopped ETOH and tobacco use for over 3 months. He never gets palpitations.  He came to the ER today because he has been getting more SOB and had some chest pain today. In the ER, he was in rapid atrial fibrillation (unaware of this) and his symptoms have improved on IV Cardizem plus O2. He has not bleeding issues and remembers being told he might bleed easily but does not remember being told his platelets were low. He is not on any medications right now because he has trouble swallowing pills, asking if medications can be crushed or given in liquid form. Denies being  anticoagulated, but was on warfarin at his d/c in 2013.    Past Medical History  Diagnosis Date  . PAD (peripheral artery disease)   . Hypertension   . Cocaine abuse   . ETOH abuse   . Tobacco abuse   . Cholelithiases 05/01/2012  . Dysrhythmia     afib  . CHF (congestive heart failure)   . Blood transfusion without reported diagnosis     Surgical History:  Past Surgical History  Procedure Laterality Date  . Endovascular stent insertion  right leg  . Tee without cardioversion  05/05/2012    Procedure: TRANSESOPHAGEAL ECHOCARDIOGRAM (TEE);  Surgeon: Thurmon Fair, MD;  Location: [facility] ENDOSCOPY;  Service: Cardiovascular;  Laterality: N/A;  . Cardioversion  05/05/2012    Procedure: CARDIOVERSION;  Surgeon: Thurmon Fair, MD;  Location: [facility] ENDOSCOPY;  Service: Cardiovascular;  Laterality: N/A;     I have reviewed the patient's current medications. Prior to Admission medications   No Rx   Scheduled Meds:  Continuous Infusions: . diltiazem (CARDIZEM) infusion 10 mg/hr (04/19/14 1159)  . heparin     Allergies: No Known Allergies  History   Social History  . Marital Status: Married    Spouse Name: Kevin [name]    Number of Children: 5  . Years of Education: 12   Occupational History  . Retired Arts administrator   Social History Main Topics  . Smoking status: Former Smoker -- 0.25 packs/day for 40 years  Types: Cigarettes    Quit date: 12/04/2013  . Smokeless tobacco: Never Used  . Alcohol Use: 0.0 - 8.0 oz/week    0-16 drink(s) per week     Comment: He used to drink up to 1/2 pint daily; 6 beers. Quit in March 2015  . Drug Use: No     Comment: cocaine, marijuana  . Sexual Activity: Yes    Partners: Female   Other Topics Concern  . Not on file   Social History Narrative   Lives with his wife.  Was raised by a non-related family from the age of 14 years, and so knows very little about his biological family history.    Family History  Problem Relation Age of Onset  . Seizures  Daughter    Family Status  Relation Status Death Age  . Mother Deceased 84    Cancer  . Father Deceased     No known CAD  . Sister Alive   . Daughter Alive   . Son Alive   . Sister Alive   . Sister Alive   . Sister Alive   . Daughter Alive   . Son Alive   . Son Alive     Review of Systems:   Full 14-point review of systems otherwise negative except as noted above.  Physical Exam: Blood pressure 179/87, pulse 85, temperature 97.7 F (36.5 C), temperature source Oral, resp. rate 24, height 5' 10.87" (1.8 m), weight 200 lb (90.719 kg), SpO2 96.00%. General: Well developed, well nourished,male in no acute distress. Sob with conversation Head: Normocephalic, atraumatic, sclera non-icteric, no xanthomas, nares are without discharge. Dentition: good Neck: No carotid bruits. JVD 8 cm. No thyromegally Lungs: Good expansion bilaterally. without wheezes or rhonchi. Slightly decreased breath sounds bases, few rales. Heart: IRRegular rate and rhythm with S1 S2.  No S3 or S4.  No murmur, no rubs, or gallops appreciated. Abdomen: Soft, non-tender, non-distended with normoactive bowel sounds. No hepatomegaly. No rebound/guarding. No obvious abdominal masses. Msk:  Strength and tone appear normal for age. No joint deformities or effusions, no spine or costo-vertebral angle tenderness. Extremities: No clubbing or cyanosis. No edema.  Distal pedal pulses are 2+ in 4 extrem Neuro: Alert and oriented X 3. Moves all extremities spontaneously. No focal deficits noted. Psych:  Responds to questions appropriately with a normal affect. Skin: No rashes or lesions noted  Labs:   Lab Results  Component Value Date   WBC 9.2 04/19/2014   HGB 13.9 04/19/2014   HCT 41.1 04/19/2014   MCV 99.3 04/19/2014   PLT 62* 04/19/2014    Recent Labs  04/19/14 0904  INR 1.15     Recent Labs Lab 04/19/14 0904  NA 139  K 3.6*  CL 102  CO2 21  BUN 19  CREATININE 1.37*  CALCIUM 8.9  PROT 7.0  BILITOT 1.3*    ALKPHOS 87  ALT 26  AST 29  GLUCOSE 115*    Recent Labs  04/19/14 0904  TROPONINI <0.30    Recent Labs  04/19/14 0911  TROPIPOC 0.03   Pro B Natriuretic peptide (BNP)  Date/Time Value Ref Range Status  04/19/2014  9:04 AM 4629.0* 0 - 125 pg/mL Final  05/02/2012 11:00 AM 513.5* 0 - 125 pg/mL Final   Drugs of Abuse     Component Value Date/Time   LABOPIA NONE DETECTED 04/19/2014 1430   COCAINSCRNUR POSITIVE* 04/19/2014 1430   LABBENZ NONE DETECTED 04/19/2014 1430   AMPHETMU NONE DETECTED 04/19/2014 1430  THCU POSITIVE* 04/19/2014 1430   LABBARB NONE DETECTED 04/19/2014 1430      Radiology/Studies: Dg Chest Portable 1 View 04/19/2014   CLINICAL DATA:  Shortness of breath.  EXAM: PORTABLE CHEST - 1 VIEW  COMPARISON:  June 23, 2013.  FINDINGS: The heart size and mediastinal contours are within normal limits. Both lungs are clear. No pneumothorax or pleural effusion is noted. The visualized skeletal structures are unremarkable.  IMPRESSION: No acute cardiopulmonary abnormality seen.   Electronically Signed   By: Roque LiasJames  Green M.D.   On: 04/19/2014 09:22   Echo: 05/02/2012:  Left ventricle: The cavity size was normal. Wall thickness was increased in a pattern of moderate LVH. There was moderate concentric hypertrophy. Systolic function was normal. The estimated ejection fraction was in the range of 55% to 65%. Although no diagnostic regional wall motion abnormality was identified, this possibility cannot be completely excluded on the basis of this study with Atrial Fibrillation. - Mitral valve: Trivial to mildregurgitation. - Left atrium: The atrium was likely moderately dilated. - Right ventricle: The cavity size was mildly dilated. Wall thickness was normal.  ECG: 04/19/2014 Atrial fib, RVR, LVH and lateral T wave changes  ASSESSMENT:  1.  Recurrent Atrial fibrillation with rapid ventricular response   2. Acute diastolic HF  3. DOE (dyspnea on exertion)  4. Cocaine  abuse  5. HTN (hypertension) -Accelerated with End organ involvement   6. Thrombocytopenia  7. Dysphagia  8. History of Hep C with probable cirrhosis on ab u/s in 2013  9. Back pain 10. Likely undiagnosed OSA   PLAN: Will need stepdown bed.  IV Cardizem, change to PO once stabilized. BNP elevated but do not see extra volume on exam, may be 2nd to afib/RVR. Check echo. Also at risk for PE, MD advise on VQ, Cr higher than usual. UDS + cocaine and THC, cessation advised. With BP Rx, will need meds liquid or that can be crushed. Add hydralazine PRN IV for now. Discuss PO Rx with MD. Low plt are chronic, but lower than usual, no bleeding issues, follow closely on heparin. Swallow eval in am per MD, but says no problem with liquids or foods.  Melida QuitterSigned, Rhonda Barrett, PA-C 04/19/2014 5:24 PM Beeper [phone]   Patient seen and examined with Theodore Demarkhonda Barrett, PA-C. We discussed all aspects of the encounter. I agree with the assessment and plan as stated above.   Patient with multiple issues including recurrent PAF with RVR, uncontrolled HTN and acute diastolic HF in setting of polysubstance abuse and undiagnosed OSA. Agree with cardizem drip for rate control. Would diurese with IV lasix as tolerated. Repeat echo. Start heparin. Will likely need anticoagulation for TEE and DC-CV though he does have cirrhosis and HCV with thrombocytopenia which complicates the issue. That said I do not think he tolerates AF well. Would also start carvedilol (had both alpha and beta blocker effect so ok with recent cocaine use) to help with HTN.   Would admit to IM service to deal with all his issues including w/u for HCV (patient was unaware of this diagnosis), severe dysphagia and cirrhosis.   We will follow to help address AF and HTN.   Truman HaywardDaniel Azel Gumina,MD 5:37 PM

## 2014-04-19 NOTE — Progress Notes (Signed)
ANTICOAGULATION CONSULT NOTE - Initial Consult  Pharmacy Consult:  Heparin Indication: chest pain/ACS  No Known Allergies  Patient Measurements: Height: 5' 10.87" (180 cm) Weight: 200 lb (90.719 kg) IBW/kg (Calculated) : 74.99 Heparin Dosing Weight: 91 kg  Vital Signs: Temp: 97.6 F (36.4 C) (07/28 1554) Temp src: Oral (07/28 1554) BP: 165/101 mmHg (07/28 1554) Pulse Rate: 86 (07/28 1500)  Labs:  Recent Labs  04/19/14 0904  HGB 13.9  HCT 41.1  PLT 62*  LABPROT 14.7  INR 1.15  CREATININE 1.37*  TROPONINI <0.30    Estimated Creatinine Clearance: 64.3 ml/min (by C-G formula based on Cr of 1.37).   Medical History: Past Medical History  Diagnosis Date  . PAD (peripheral artery disease)   . Hypertension   . Cocaine abuse   . ETOH abuse   . Tobacco abuse   . Cholelithiases 05/01/2012  . Dysrhythmia     afib  . CHF (congestive heart failure)   . Blood transfusion without reported diagnosis       Assessment: Kevin [name] admitted with SOB and chest pain to start IV heparin for ACS.  Patient reports he has a history of Afib but stopped taking his Coumadin about 9 months ago due to difficulty with swallowing.  PA/MD aware patient has a history of chronic thrombocytopenia.   Goal of Therapy:  Heparin level 0.3-0.7 units/ml Monitor platelets by anticoagulation protocol: Yes    Plan:  - Heparin 1250 units/hr, no bolus to minimize the risk of bleeding - Check 6 hr HL - Daily HL / CBC    Poppy Mcafee D. Laney Potash, PharmD, BCPS Pager:  [phone] 04/19/2014, 4:19 PM

## 2014-04-19 NOTE — ED Notes (Signed)
Family states that she has noticed pt having more difficulty sleeping at night. Pt states that he has noticed the same. Pt reports getting "jumping up" during the night when he is sleeping because he cannot get his breath. Pt states that this has become worse over the last month.

## 2014-04-19 NOTE — ED Notes (Signed)
Per pt sts that he has been SOB for 2 weeks. sts also some intermittent chest pain. sts that he is unable to lay flat or sleep. sts he feels like he is falling apart.

## 2014-04-19 NOTE — ED Provider Notes (Signed)
CSN: [id number]     Arrival date & time 04/19/14  4782 History   First MD Initiated Contact with Patient 04/19/14 0845     Chief Complaint  Patient presents with  . Shortness of Breath     (Consider location/radiation/quality/duration/timing/severity/associated sxs/prior Treatment) The history is provided by the patient. No language interpreter was used.  Kevin [name] is a 62 y/o M with PMhx of PAD, HTN, cocaine and alcohol abuse, CHF, dysrhythmia presenting to the ED with shortness of breath that started approximately 2 weeks ago. As per patient, reported that he's been having difficulty breathing more so at night when he is laying flat. Stated that when he is walking reported that he has to stop at least every 3-5 blocks in order to catch his breath. Stated he's been having a dry cough over the past couple of days-denies hemoptysis. Stated that he used to smoke cigarettes but stopped approximately one month ago. Stated that last night she started to experience chest pain localized left-sided chest described as a "sticking" sensation without radiation. Stated that he does smoke marijuana at least 1-2 times per day. Denied cocaine and heroin abuse. Patient reported that he was seen approximately 4-5 months ago regarding similar symptoms, reported that he has not taken any of his medications for the past 4-5 months-denied use of Lasix, isosorbide, lisinopril, warfarin. Stated that he has not followed up with cardiology. Denied long distance traveling, swelling to the legs, hemoptysis, numbness, tingling, weakness. PCP none Cardiology none  Past Medical History  Diagnosis Date  . PAD (peripheral artery disease)   . Hypertension   . Cocaine abuse   . ETOH abuse   . Tobacco abuse   . Cholelithiases 05/01/2012  . Dysrhythmia     afib  . CHF (congestive heart failure)   . Blood transfusion without reported diagnosis   . Hepatitis C antibody test positive 04/2012   Past Surgical History   Procedure Laterality Date  . Endovascular stent insertion  right leg  . Tee without cardioversion  05/05/2012    Procedure: TRANSESOPHAGEAL ECHOCARDIOGRAM (TEE);  Surgeon: Thurmon Fair, MD;  Location: [facility] ENDOSCOPY;  Service: Cardiovascular;  Laterality: N/A;  . Cardioversion  05/05/2012    Procedure: CARDIOVERSION;  Surgeon: Thurmon Fair, MD;  Location: [facility] ENDOSCOPY;  Service: Cardiovascular;  Laterality: N/A;   Family History  Problem Relation Age of Onset  . Seizures Daughter    History  Substance Use Topics  . Smoking status: Former Smoker -- 0.25 packs/day for 40 years    Types: Cigarettes    Quit date: 12/04/2013  . Smokeless tobacco: Never Used  . Alcohol Use: 0.0 - 8.0 oz/week    0-16 drink(s) per week     Comment: He used to drink up to 1/2 pint daily; 6 beers. Quit in March 2015    Review of Systems  Constitutional: Negative for fever and chills.  Respiratory: Positive for cough and shortness of breath. Negative for chest tightness and wheezing.   Cardiovascular: Positive for chest pain. Negative for leg swelling.  Gastrointestinal: Negative for abdominal distention.  Genitourinary: Negative for decreased urine volume.  Musculoskeletal: Positive for back pain. Negative for neck pain.      Allergies  Review of patient's allergies indicates no known allergies.  Home Medications   Prior to Admission medications   Not on File   BP 165/103  Pulse 85  Temp(Src) 98.2 F (36.8 C) (Oral)  Resp 15  Ht 5' 10.87" (1.8 m)  Wt  200 lb (90.719 kg)  BMI 28.00 kg/m2  SpO2 96% Physical Exam  Nursing note and vitals reviewed. Constitutional: He is oriented to person, place, and time. He appears well-developed and well-nourished. No distress.  Patient sitting upright in bed, negative findings of distress  HENT:  Head: Normocephalic and atraumatic.  Mouth/Throat: Oropharynx is clear and moist. No oropharyngeal exudate.  Eyes: Conjunctivae and EOM are normal. Pupils are  equal, round, and reactive to light. Right eye exhibits no discharge. Left eye exhibits no discharge.  Neck: Normal range of motion. Neck supple. No tracheal deviation present.  Cardiovascular: Normal heart sounds.  An irregularly irregular rhythm present.  Pulses:      Radial pulses are 2+ on the left side.       Dorsalis pedis pulses are 2+ on the right side, and 2+ on the left side.  Cap refill less than 3 seconds Negative swelling or pitting edema identified to lower extremities bilaterally  Pulmonary/Chest: Effort normal and breath sounds normal. No respiratory distress. He has no wheezes. He has no rales. He exhibits no tenderness.  Patient is able to speak full sentences without difficulty  Negative use of accessory muscles Negative stridor  Negative pain upon palpation to the chest wall Good lung expansion bilaterally   Abdominal: Soft. Bowel sounds are normal. He exhibits no distension. There is no tenderness. There is no rebound and no guarding.  Negative abdominal distension  BS normoactive in all 4 quadrants Negative pain upon palpation  Negative fluid wave noted - negative findings of ascites  Musculoskeletal: Normal range of motion. He exhibits no edema and no tenderness.  Full ROM to upper and lower extremities without difficulty noted, negative ataxia noted.  Lymphadenopathy:    He has no cervical adenopathy.  Neurological: He is alert and oriented to person, place, and time. No cranial nerve deficit. He exhibits normal muscle tone. Coordination normal.  Cranial nerves III-XII grossly intact Strength 5+/5+ to upper and lower extremities bilaterally with resistance applied, equal distribution noted Equal grip strength bilaterally  Negative facial drooping Negative slurred speech  Negative aphasia Negative arm drift Fine motor skills intact  Skin: Skin is warm and dry. No rash noted. He is not diaphoretic. No erythema.  Psychiatric: He has a normal mood and affect. His  behavior is normal. Thought content normal.    ED Course  Procedures (including critical care time)  12:21 PM This provider spoke with Rosann Auerbach, [facility] - discussed case, labs, imaging in great detail - patient to be seen and assessed by Cardiology.   5:21 PM This provider spoke with Dr. Jones Broom, Cardiologist - as per Cardiologist, recommended patient to be admitted under the care of Internal medicine. Reported that Cardiology will consult. Stated that patient has history of substance abuse, cirrhosis on past [place] reported that patient needs to be admitted under the care of Internal medicine.   5:52 PM This provider spoke with Dr. Waymon Amato who reported that patient does not need to be admitted to Medicine since this is a cardiac issue strictly.  5:55 PM This provider spoke with Dr. Waymon Amato - patient to be admitted to [facility].   Results for orders placed during the hospital encounter of 04/19/14  PRO B NATRIURETIC PEPTIDE      Result Value Ref Range   Pro B Natriuretic peptide (BNP) 4629.0 (*) 0 - 125 pg/mL  CBC WITH DIFFERENTIAL      Result Value Ref Range   WBC 9.2  4.0 -  10.5 K/uL   RBC 4.14 (*) 4.22 - 5.81 MIL/uL   Hemoglobin 13.9  13.0 - 17.0 g/dL   HCT 68.6  16.8 - 37.2 %   MCV 99.3  78.0 - 100.0 fL   MCH 33.6  26.0 - 34.0 pg   MCHC 33.8  30.0 - 36.0 g/dL   RDW 90.2  11.1 - 55.2 %   Platelets 62 (*) 150 - 400 K/uL   Neutrophils Relative % 83 (*) 43 - 77 %   Neutro Abs 7.6  1.7 - 7.7 K/uL   Lymphocytes Relative 11 (*) 12 - 46 %   Lymphs Abs 1.0  0.7 - 4.0 K/uL   Monocytes Relative 5  3 - 12 %   Monocytes Absolute 0.4  0.1 - 1.0 K/uL   Eosinophils Relative 1  0 - 5 %   Eosinophils Absolute 0.1  0.0 - 0.7 K/uL   Basophils Relative 0  0 - 1 %   Basophils Absolute 0.0  0.0 - 0.1 K/uL  COMPREHENSIVE METABOLIC PANEL      Result Value Ref Range   Sodium 139  137 - 147 mEq/L   Potassium 3.6 (*) 3.7 - 5.3 mEq/L   Chloride 102  96 - 112 mEq/L   CO2 21  19 - 32  mEq/L   Glucose, Bld 115 (*) 70 - 99 mg/dL   BUN 19  6 - 23 mg/dL   Creatinine, Ser 0.80 (*) 0.50 - 1.35 mg/dL   Calcium 8.9  8.4 - 22.3 mg/dL   Total Protein 7.0  6.0 - 8.3 g/dL   Albumin 3.7  3.5 - 5.2 g/dL   AST 29  0 - 37 U/L   ALT 26  0 - 53 U/L   Alkaline Phosphatase 87  39 - 117 U/L   Total Bilirubin 1.3 (*) 0.3 - 1.2 mg/dL   GFR calc non Af Amer 54 (*) >90 mL/min   GFR calc Af Amer 62 (*) >90 mL/min   Anion gap 16 (*) 5 - 15  TROPONIN I      Result Value Ref Range   Troponin I <0.30  <0.30 ng/mL  TSH      Result Value Ref Range   TSH 4.220  0.350 - 4.500 uIU/mL  MAGNESIUM      Result Value Ref Range   Magnesium 1.8  1.5 - 2.5 mg/dL  PROTIME-INR      Result Value Ref Range   Prothrombin Time 14.7  11.6 - 15.2 seconds   INR 1.15  0.00 - 1.49  URINE RAPID DRUG SCREEN (HOSP PERFORMED)      Result Value Ref Range   Opiates NONE DETECTED  NONE DETECTED   Cocaine POSITIVE (*) NONE DETECTED   Benzodiazepines NONE DETECTED  NONE DETECTED   Amphetamines NONE DETECTED  NONE DETECTED   Tetrahydrocannabinol POSITIVE (*) NONE DETECTED   Barbiturates NONE DETECTED  NONE DETECTED  I-STAT TROPOININ, ED      Result Value Ref Range   Troponin i, poc 0.03  0.00 - 0.08 ng/mL   Comment 3             Labs Review Labs Reviewed  PRO B NATRIURETIC PEPTIDE - Abnormal; Notable for the following:    Pro B Natriuretic peptide (BNP) 4629.0 (*)    All other components within normal limits  CBC WITH DIFFERENTIAL - Abnormal; Notable for the following:    RBC 4.14 (*)    Platelets 62 (*)  Neutrophils Relative % 83 (*)    Lymphocytes Relative 11 (*)    All other components within normal limits  COMPREHENSIVE METABOLIC PANEL - Abnormal; Notable for the following:    Potassium 3.6 (*)    Glucose, Bld 115 (*)    Creatinine, Ser 1.37 (*)    Total Bilirubin 1.3 (*)    GFR calc non Af Amer 54 (*)    GFR calc Af Amer 62 (*)    Anion gap 16 (*)    All other components within normal limits   URINE RAPID DRUG SCREEN (HOSP PERFORMED) - Abnormal; Notable for the following:    Cocaine POSITIVE (*)    Tetrahydrocannabinol POSITIVE (*)    All other components within normal limits  TROPONIN I  TSH  MAGNESIUM  PROTIME-INR  HEPARIN LEVEL (UNFRACTIONATED)  HEPARIN LEVEL (UNFRACTIONATED)  CBC  TROPONIN I  I-STAT TROPOININ, ED    Imaging Review Dg Chest Portable 1 View  04/19/2014   CLINICAL DATA:  Shortness of breath.  EXAM: PORTABLE CHEST - 1 VIEW  COMPARISON:  June 23, 2013.  FINDINGS: The heart size and mediastinal contours are within normal limits. Both lungs are clear. No pneumothorax or pleural effusion is noted. The visualized skeletal structures are unremarkable.  IMPRESSION: No acute cardiopulmonary abnormality seen.   Electronically Signed   By: Roque Lias M.D.   On: 04/19/2014 09:22     EKG Interpretation None      MDM   Final diagnoses:  Atrial fibrillation with rapid ventricular response  Polysubstance abuse    Medications  diltiazem (CARDIZEM) 1 mg/mL load via infusion 15 mg (0 mg Intravenous Stopped 04/19/14 0921)    And  diltiazem (CARDIZEM) 100 mg in dextrose 5 % 100 mL infusion (10 mg/hr Intravenous Rate/Dose Change 04/19/14 1159)  hydrALAZINE (APRESOLINE) injection 10 mg (10 mg Intravenous Given 04/19/14 1725)  heparin ADULT infusion 100 units/mL (25000 units/250 mL) (1,250 Units/hr Intravenous New Bag/Given 04/19/14 1733)  furosemide (LASIX) injection 40 mg (not administered)  carvedilol (COREG) tablet 6.25 mg (not administered)  potassium chloride 20 MEQ/15ML (10%) liquid 20 mEq (not administered)  potassium chloride 20 MEQ/15ML (10%) liquid 20 mEq (20 mEq Oral Given 04/19/14 1616)   Filed Vitals:   04/19/14 1639 04/19/14 1700 04/19/14 1725 04/19/14 1726  BP:  179/87 179/87 165/103  Pulse:  85    Temp: 97.7 F (36.5 C)   98.2 F (36.8 C)  TempSrc: Oral   Oral  Resp:    15  Height:      Weight:      SpO2:  96%  96%   This provider  reviewed patient's chart. Patient was last seen in the emergency department back in 2013 regarding chest pain-cardiac catheterization performed in 2013 with normal coronary arteries identified. Patient found to be in atrial fibrillation with cardioversion. Patient not taking any medications for the past 4-5 months. EKG noted atrial fibrillation with RVR with a heart rate of 144 beats per minute with right superior axis deviation. Troponin negative elevation. BNP elevated at 4629.0. CBC negative elevated white blood cell count-negative left shift or leukocytosis noted. Hemoglobin 13.9, hematocrit 41.1. CMP noted mildly low potassium 3.6. Creatinine elevated 1.37-when compared to previous labs creatinine was elevated at 1.49. BUN 19. AST, ALT, alkaline phosphatase negative elevation. Mild elevation of bilirubin at 1.3. Anion gap of 16.0 mEq per liter. INR 1.15. Magnesium 1.8. Urine drug screen positive for cocaine and cannabis. Chest x-ray negative for acute cardiac pulmonary disease. Patient  started on Cardizem while in ED setting. Cardiology to be consulted. [facility] and for Cardiologists to follow.   Raymon MuttonMarissa Sunny Aguon, PA-C 04/19/14 1759

## 2014-04-19 NOTE — H&P (Addendum)
History and Physical  Kevin [name] Keahey ZOX:[id number] DOB: 02-29-1951 DOA: 04/19/2014  Referring physician: Maximiano CossMarissa Simmons, ED PA-C. PCP: Jackie PlumSEI-BONSU,GEORGE, MD  Outpatient Specialists:  1. None  Chief Complaint: Difficulty breathing.  HPI: Kevin [name] Vasudevan is a 63 y.o. male with extensive past medical history-PAD with a right lower extremity stents, A. fib with RVR status post TEE/DCCV, HTN, polysubstance abuse-tobacco, alcohol, cocaine & THC, chronic thrombocytopenia, NSVT, positive hepatitis C antibody 2013 and abdominal ultrasound suspicious for cirrhosis in 2013, medication noncompliance, presented to the ED with complaints of difficulty breathing. He states that he was on 12-13 different medications and started having difficulty swallowing and felt that the pills were getting stuck in his throat despite trying to wash them down with liquids. He voluntarily stopped taking all his medications without discussing with his M.D. He however denies difficulty swallowing food or liquids. He states that he normally eats food slowly and chews for a long time and denies dysphagia. We initially stated that he gained weight but his wife indicates that he may have lost about 10 pounds in the last 8-9 months. Appetite is good. He denies nausea, vomiting, abdominal pain or diarrhea. Gives one month history of progressively worsening dyspnea, orthopnea, PND but no leg swelling, fever, chills or productive cough. He complains of some left-sided intermittent chest pain associated with dyspnea when he is lying flat and is relieved on sitting up. This pain is nonradiating. His wife confirms that he has quit drinking alcohol and smoking since March of 2015. He however continues to abuse cocaine and THC-last on 04/16/14. Neither one of them are aware of positive hepatitis C antibody and ultrasound with cirrhosis. In the ED, he was found to be in A. fib with RVR. Cardiologists have evaluated and recommend hospitalist  admission.   Review of Systems: All systems reviewed and apart from history of presenting illness, are negative. Gives history of intermittent spasms of his lower back.  Past Medical History  Diagnosis Date  . PAD (peripheral artery disease)   . Hypertension   . Cocaine abuse   . ETOH abuse   . Tobacco abuse   . Cholelithiases 05/01/2012  . Dysrhythmia     afib  . CHF (congestive heart failure)   . Blood transfusion without reported diagnosis   . Hepatitis C antibody test positive 04/2012   Past Surgical History  Procedure Laterality Date  . Endovascular stent insertion  right leg  . Tee without cardioversion  05/05/2012    Procedure: TRANSESOPHAGEAL ECHOCARDIOGRAM (TEE);  Surgeon: Thurmon FairMihai Croitoru, MD;  [facility];  Service: Cardiovascular;  Laterality: N/A;  . Cardioversion  05/05/2012    Procedure: CARDIOVERSION;  Surgeon: Thurmon FairMihai Croitoru, MD;  Location: [facility] ENDOSCOPY;  Service: Cardiovascular;  Laterality: N/A;   Social History:  reports that he quit smoking about 4 months ago. His smoking use included Cigarettes. He has a 10 pack-year smoking history. He has never used smokeless tobacco. He reports that he drinks alcohol. He reports that he does not use illicit drugs. Independent of activities of daily living. Retired.  No Known Allergies  Family History  Problem Relation Age of Onset  . Seizures Daughter     Prior to Admission medications   Not on File   Physical Exam: Filed Vitals:   04/19/14 1725 04/19/14 1726 04/19/14 1730 04/19/14 1800  BP: 179/87 165/103 161/91 157/98  Pulse:    56  Temp:  98.2 F (36.8 C)    TempSrc:  Oral  Resp:  15  21  Height:      Weight:      SpO2:  96% 97% 98%     General exam: Moderately built and nourished pleasant middle-aged male patient, lying propped up on the gurney in mild respiratory distress-especially when he speaks.  Head, eyes and ENT: Nontraumatic and normocephalic. Pupils equally reacting to light and  accommodation. Bilateral cataracts. Oral mucosa moist.  Neck: Supple. No JVD, carotid bruit or thyromegaly.  Lymphatics: No lymphadenopathy.  Respiratory system: Slightly diminished breath sounds in the bases with occasional basal crackles right greater than the left. Rest of lung fields clear to auscultation. No increased work of breathing.  Cardiovascular system: S1 and S2 heard, irregularly irregular and rapid. No JVD, murmurs, gallops, clicks or pedal edema. Trace sacral edema.  Gastrointestinal system: Abdomen is nondistended, soft and nontender. Normal bowel sounds heard. No organomegaly or masses appreciated.  Central nervous system: Alert and oriented. No focal neurological deficits.  Extremities: Symmetric 5 x 5 power. Peripheral pulses symmetrically felt. Area of hyperpigmentation in the left mid forearm without acute findings.  Skin: No rashes or acute findings.  Musculoskeletal system: Negative exam.  Psychiatry: Pleasant and cooperative.   Labs on Admission:  Basic Metabolic Panel:  Recent Labs Lab 04/19/14 0904  NA 139  K 3.6*  CL 102  CO2 21  GLUCOSE 115*  BUN 19  CREATININE 1.37*  CALCIUM 8.9  MG 1.8   Liver Function Tests:  Recent Labs Lab 04/19/14 0904  AST 29  ALT 26  ALKPHOS 87  BILITOT 1.3*  PROT 7.0  ALBUMIN 3.7   No results found for this basename: LIPASE, AMYLASE,  in the last 168 hours No results found for this basename: AMMONIA,  in the last 168 hours CBC:  Recent Labs Lab 04/19/14 0904  WBC 9.2  NEUTROABS 7.6  HGB 13.9  HCT 41.1  MCV 99.3  PLT 62*   Cardiac Enzymes:  Recent Labs Lab 04/19/14 0904  TROPONINI <0.30    BNP (last 3 results)  Recent Labs  04/19/14 0904  PROBNP 4629.0*   CBG: No results found for this basename: GLUCAP,  in the last 168 hours  Radiological Exams on Admission: Dg Chest Portable 1 View  04/19/2014   CLINICAL DATA:  Shortness of breath.  EXAM: PORTABLE CHEST - 1 VIEW  COMPARISON:   June 23, 2013.  FINDINGS: The heart size and mediastinal contours are within normal limits. Both lungs are clear. No pneumothorax or pleural effusion is noted. The visualized skeletal structures are unremarkable.  IMPRESSION: No acute cardiopulmonary abnormality seen.   Electronically Signed   By: Roque Lias M.D.   On: 04/19/2014 09:22    EKG: Independently reviewed. Atrial fibrillation with RVR 144 beats per minute.  Assessment/Plan Principal Problem:   Atrial fibrillation with rapid ventricular response Active Problems:   Cocaine abuse   HTN (hypertension) -Accelerated with End organ involvement   Thrombocytopenia on admission 04/30/12, thought to be secondary to Xarelto, Platelets no better 2015   DOE (dyspnea on exertion)   Hepatitis C antibody test positive   Diastolic CHF, acute on chronic   Atrial fibrillation with RVR   Dysphagia   1. A. fib with RVR: Admitting to step down unit. Management per cardiology. Patient has been started on IV Cardizem infusion, IV heparin infusion and oral carvedilol (alpha and beta blockade given cocaine abuse). Check 2-D echo. Improving. TSH 4.220. May undergo TEE and cardioversion. 2. Dyspnea secondary to acute on  chronic diastolic CHF versus rapid A. fib: Other DD-PE. Check 2-D echo. Continue IV Lasix. Control A. fib. Patient will be anticoagulated on IV heparin. Creatinine was 1.45 in October 2014 and 1.35 today-not sure if he has progressive chronic kidney disease. If creatinine normalizes in a.m., may consider CT angiogram of chest to evaluate for PE versus VQ scan if it does not. However if he is going to be on long-term anticoagulation, may not need CT. 3. Accelerated hypertension: Secondary to noncompliance. Started carvedilol and when necessary hydralazine. Improving. Monitor 4. Polysubstance abuse-cocaine/THC/tobacco/alcohol: He has quit tobacco and alcohol in March 2015. Counseled cessation from cocaine and THC. He verbalized  understanding. 5. Dysphagia: Not sure if he truly has dysphagia given no issues with solid food and liquids. We'll get speech therapy to evaluate swallowing. 6. Hepatitis C antibody positive: Ultrasound in 2013 suggestive of cirrhosis. We'll get hepatitis C antibody and viral load. Outpatient consultation/followup in infectious disease clinic. 7. Chronic thrombocytopenia: Seems to be at baseline. Follow closely. 8. Possible chronic kidney disease: Followup BMP 9. Medication noncompliance: Extensively counseled regarding importance of compliance with medications and his healthcare. Patient and spouse verbalized understanding.     Code Status: Full  Family Communication: Discussed with spouse at bedside  Disposition Plan: Home when medically stable.   Time spent: 60 minutes  HONGALGI,ANAND, MD, FACP, FHM. [facility] Pager [phone]  If 7PM-7AM, please contact night-coverage [email or web address] Password [facility] 04/19/2014, 6:27 PM

## 2014-04-19 NOTE — ED Notes (Signed)
Admitting MD at bedside.

## 2014-04-19 NOTE — Progress Notes (Signed)
ANTICOAGULATION CONSULT NOTE Pharmacy Consult:  Heparin Indication: Afib  No Known Allergies  Patient Measurements: Height: 5' 10.87" (180 cm) Weight: 200 lb (90.719 kg) IBW/kg (Calculated) : 74.99   Vital Signs: Temp: 98.5 F (36.9 C) (07/28 2000) Temp src: Oral (07/28 2000) BP: 152/94 mmHg (07/28 2000) Pulse Rate: 90 (07/28 2000)  Labs:  Recent Labs  04/19/14 0904 04/19/14 1910 04/19/14 2300  HGB 13.9  --   --   HCT 41.1  --   --   PLT 62*  --   --   LABPROT 14.7  --   --   INR 1.15  --   --   HEPARINUNFRC  --   --  0.10*  CREATININE 1.37*  --   --   TROPONINI <0.30 <0.30  --     Estimated Creatinine Clearance: 64.3 ml/min (by C-G formula based on Cr of 1.37).  Assessment: 63 yo male with Afib for heparin  Goal of Therapy:  Heparin level 0.3-0.7 units/ml Monitor platelets by anticoagulation protocol: Yes   Plan:  Heparin 2000 units IV bolus, then increase heparin 1450 units/hr  Check heparin level in 8 hours.   Geannie Risen, PharmD, BCPS  04/19/2014, 11:38 PM

## 2014-04-19 NOTE — ED Notes (Signed)
Kevin [name], Cards PA at bedside

## 2014-04-19 NOTE — ED Notes (Signed)
Attempted report x1. 

## 2014-04-20 DIAGNOSIS — Z7901 Long term (current) use of anticoagulants: Secondary | ICD-10-CM

## 2014-04-20 DIAGNOSIS — I5031 Acute diastolic (congestive) heart failure: Secondary | ICD-10-CM

## 2014-04-20 DIAGNOSIS — I369 Nonrheumatic tricuspid valve disorder, unspecified: Secondary | ICD-10-CM

## 2014-04-20 DIAGNOSIS — I10 Essential (primary) hypertension: Secondary | ICD-10-CM

## 2014-04-20 DIAGNOSIS — F1910 Other psychoactive substance abuse, uncomplicated: Secondary | ICD-10-CM

## 2014-04-20 LAB — CBC
HCT: 40.7 % (ref 39.0–52.0)
Hemoglobin: 13.6 g/dL (ref 13.0–17.0)
MCH: 33.5 pg (ref 26.0–34.0)
MCHC: 33.4 g/dL (ref 30.0–36.0)
MCV: 100.2 fL — ABNORMAL HIGH (ref 78.0–100.0)
Platelets: 59 10*3/uL — ABNORMAL LOW (ref 150–400)
RBC: 4.06 MIL/uL — ABNORMAL LOW (ref 4.22–5.81)
RDW: 12.4 % (ref 11.5–15.5)
WBC: 7.9 10*3/uL (ref 4.0–10.5)

## 2014-04-20 LAB — HEPARIN LEVEL (UNFRACTIONATED): Heparin Unfractionated: 0.34 IU/mL (ref 0.30–0.70)

## 2014-04-20 LAB — GLUCOSE, CAPILLARY: Glucose-Capillary: 117 mg/dL — ABNORMAL HIGH (ref 70–99)

## 2014-04-20 LAB — HEPATITIS C ANTIBODY: HCV Ab: REACTIVE — AB

## 2014-04-20 MED ORDER — CARVEDILOL 12.5 MG PO TABS
12.5000 mg | ORAL_TABLET | Freq: Two times a day (BID) | ORAL | Status: DC
  Administered 2014-04-20 – 2014-04-23 (×6): 12.5 mg via ORAL
  Filled 2014-04-20 (×8): qty 1

## 2014-04-20 MED ORDER — DILTIAZEM HCL ER COATED BEADS 240 MG PO CP24
240.0000 mg | ORAL_CAPSULE | Freq: Every day | ORAL | Status: DC
  Administered 2014-04-20 – 2014-04-23 (×4): 240 mg via ORAL
  Filled 2014-04-20 (×4): qty 1

## 2014-04-20 NOTE — Progress Notes (Signed)
Pharmacy note: Xarelto  63 yo male with afib on heparin and pharmacy asked to transition to Xarelto.  Patient as been on Xarelto in the past (2013) and was changed to Coumadin due to thrombocytopenia.    -The Rocket Trial for Xarelto excluded patients with a platelet count < 90K due to hemorrhage risk -Post marketing reports for Xarelto include thrombocytopenia (note that thrombocytopenia for this patient has persisted on Coumadin; although he has been non-compliant. Patient noted with cirrhosis)  -Spoke with Dr. Jomarie Longs: plan will be to hold of on anticoagulation for now due to concern of bleeding risk and likely non-compliance with coumadin.  Plan -Hold further anticoagulation therapy -Will sign off for now. Please contact pharmacy with any other needs  Harland German, Pharm D 04/20/2014 3:01 PM

## 2014-04-20 NOTE — Progress Notes (Addendum)
[facility] PROGRESS NOTE  Kevin [name] FYB:[id number] DOB: 1951-08-02 DOA: 04/19/2014 PCP: Jackie Plum, MD  Assessment/Plan: 1. A. fib with RVR:  -On Cardizem gtt per Cardiology, change to PO  -continue coreg -2-D echo pending, TSH 4.220 -no plans for TEE and cardioversion at this time -Per Cards, start Xarelto, has to be weighed with risk of bleeding given Thrombocytopenia  2. Acute on chronic diastolic CHF  -improving, continue IV lasix, FU ECHO -Do not suspect PE  3. Accelerated hypertension: Secondary to noncompliance  -improving, conitnue carvedilol, diltiazem and PRN hydralazine  4. Polysubstance abuse-cocaine/THC/tobacco/alcohol: He has quit tobacco and alcohol in March 2015. Counseled cessation from cocaine and THC. He verbalized understanding.  5. Dysphagia: Not sure if he truly has dysphagia given no issues with solid food and liquids. Speech therapy eval pending  6. Cirrhosis noted on Imaging, likely multifactorial, l;ong term alcoholic, quite in March and Hepatitis C antibody positive      -Ultrasound in 2013 suggestive of cirrhosis      -FU hepatitis C antibody and viral load. Outpatient followup in infectious disease clinic.  7. Chronic thrombocytopenia: Seems to be at baseline. Likely related to liver disease, increased risk of bleeding with anticoagulation  8. Possible chronic kidney disease:   9. Medication noncompliance:  -counseled  Code Status: Full Code Family Communication: none at bedside Disposition Plan: keep in SDU today   Consultants:  cards  HPI/Subjective: Feels ok, still with some dyspnea, no chest pain  Objective: Filed Vitals:   04/20/14 0911  BP: 158/89  Pulse: 92  Temp:   Resp:     Intake/Output Summary (Last 24 hours) at 04/20/14 1401 Last data filed at 04/20/14 1319  Gross per 24 hour  Intake 522.13 ml  Output   2170 ml  Net -1647.87 ml   Filed Weights   04/19/14 1554 04/20/14 0400  Weight: 90.719 kg  (200 lb) 90.7 kg (199 lb 15.3 oz)    Exam:   General:  AAOx3  Cardiovascular: Irregular rate and rhythm  Respiratory: fine basilar crackles  Abdomen: soft, NT, ND, BS present  Musculoskeletal: no edema c/c  Data Reviewed: Basic Metabolic Panel:  Recent Labs Lab 04/19/14 0904  NA 139  K 3.6*  CL 102  CO2 21  GLUCOSE 115*  BUN 19  CREATININE 1.37*  CALCIUM 8.9  MG 1.8   Liver Function Tests:  Recent Labs Lab 04/19/14 0904  AST 29  ALT 26  ALKPHOS 87  BILITOT 1.3*  PROT 7.0  ALBUMIN 3.7   No results found for this basename: LIPASE, AMYLASE,  in the last 168 hours No results found for this basename: AMMONIA,  in the last 168 hours CBC:  Recent Labs Lab 04/19/14 0904 04/20/14 0831  WBC 9.2 7.9  NEUTROABS 7.6  --   HGB 13.9 13.6  HCT 41.1 40.7  MCV 99.3 100.2*  PLT 62* 59*   Cardiac Enzymes:  Recent Labs Lab 04/19/14 0904 04/19/14 1910  TROPONINI <0.30 <0.30   BNP (last 3 results)  Recent Labs  04/19/14 0904  PROBNP 4629.0*   CBG: No results found for this basename: GLUCAP,  in the last 168 hours  Recent Results (from the past 240 hour(s))  MRSA PCR SCREENING     Status: None   Collection Time    04/19/14  7:10 PM      Result Value Ref Range Status   MRSA by PCR NEGATIVE  NEGATIVE Final   Comment:  The GeneXpert MRSA Assay (FDA     approved for NASAL specimens     only), is one component of a     comprehensive MRSA colonization     surveillance program. It is not     intended to diagnose MRSA     infection nor to guide or     monitor treatment for     MRSA infections.     Studies: Dg Chest Portable 1 View  04/19/2014   CLINICAL DATA:  Shortness of breath.  EXAM: PORTABLE CHEST - 1 VIEW  COMPARISON:  June 23, 2013.  FINDINGS: The heart size and mediastinal contours are within normal limits. Both lungs are clear. No pneumothorax or pleural effusion is noted. The visualized skeletal structures are unremarkable.   IMPRESSION: No acute cardiopulmonary abnormality seen.   Electronically Signed   By: Roque LiasJames  Green M.D.   On: 04/19/2014 09:22    Scheduled Meds: . carvedilol  12.5 mg Oral BID WC  . furosemide  40 mg Intravenous BID  . potassium chloride  20 mEq Oral BID  . sodium chloride  3 mL Intravenous Q12H   Continuous Infusions: . diltiazem (CARDIZEM) infusion 10 mg/hr (04/20/14 1118)  . heparin 1,450 Units/hr (04/20/14 1117)   Antibiotics Given (last 72 hours)   None      Principal Problem:   Atrial fibrillation with rapid ventricular response Active Problems:   Cocaine abuse   HTN (hypertension) -Accelerated with End organ involvement   Thrombocytopenia on admission 04/30/12, thought to be secondary to Xarelto, Platelets no better 2015   DOE (dyspnea on exertion)   Hepatitis C antibody test positive   Diastolic CHF, acute on chronic   Atrial fibrillation with RVR   Dysphagia    Time spent: 25min    [facility] Martin SouthJOSEPH,Chazlyn Cude  [facility] Pager [phone]. If 7PM-7AM, please contact night-coverage at [email or web address], password [email or web address] 04/20/2014, 2:01 PM  LOS: 1 day

## 2014-04-20 NOTE — Progress Notes (Signed)
     SUBJECTIVE: Breathing is better.   BP 158/89  Pulse 92  Temp(Src) 98 F (36.7 C) (Oral)  Resp 24  Ht 5' 10.87" (1.8 m)  Wt 199 lb 15.3 oz (90.7 kg)  BMI 27.99 kg/m2  SpO2 99%  Intake/Output Summary (Last 24 hours) at 04/20/14 1039 Last data filed at 04/20/14 1024  Gross per 24 hour  Intake 282.13 ml  Output   1570 ml  Net -1287.87 ml    PHYSICAL EXAM General: Well developed, well nourished, in no acute distress. Alert and oriented x 3.  Psych:  Good affect, responds appropriately Neck: No JVD. No masses noted.  Lungs: Clear bilaterally with no wheezes or rhonci noted.  Heart: RRR with no murmurs noted. Abdomen: Bowel sounds are present. Soft, non-tender.  Extremities: No lower extremity edema.   LABS: Basic Metabolic Panel:  Recent Labs  43/15/40 0904  NA 139  K 3.6*  CL 102  CO2 21  GLUCOSE 115*  BUN 19  CREATININE 1.37*  CALCIUM 8.9  MG 1.8   CBC:  Recent Labs  04/19/14 0904 04/20/14 0831  WBC 9.2 7.9  NEUTROABS 7.6  --   HGB 13.9 13.6  HCT 41.1 40.7  MCV 99.3 100.2*  PLT 62* 59*   Cardiac Enzymes:  Recent Labs  04/19/14 0904 04/19/14 1910  TROPONINI <0.30 <0.30   Current Meds: . carvedilol  6.25 mg Oral BID WC  . furosemide  40 mg Intravenous BID  . potassium chloride  20 mEq Oral BID  . sodium chloride  3 mL Intravenous Q12H    ASSESSMENT AND PLAN:  1. Atrial fib with RVR: Rate controlled on Cardizem drip at 10 mg/hour. Will change to CArdizem CD 240 mg once daily today. Continue heparin drip for anti-coagulation. He will need long term anti-coagulation. He has been on coumadin in the past but stopped taking on his own. Had been on Xarelto before without issues but not clear why that was stopped. His wife also does not know why it was stopped. Consider starting Xarelto 20 mg once daily and stopping heparin if ok with primary team. Would hold on cardioversion at this time since he is rate controlled. Could plan one month of  anti-coagulation, f/u with his primary cardiologist Dr. Rennis Golden and then cardioversion at that time if still in atrial fib.   2. Acute on chronic diastolic CHF: Diuresing well with IV Lasix. Continue IV Lasix today. Check BMET in am. Echo pending today.   3. Uncontrolled HTN:  BP still not optimally controlled. Will increase Coreg to 12.5 mg po BID.   4. Polysubstance abuse: Cessation advised.   Ivet Guerrieri  7/29/201510:39 AM

## 2014-04-20 NOTE — Progress Notes (Signed)
  Echocardiogram 2D Echocardiogram has been performed.  Cathie Beams 04/20/2014, 3:24 PM

## 2014-04-20 NOTE — Evaluation (Signed)
Clinical/Bedside Swallow Evaluation Patient Details  Name: Kevin [name] MRN: [id number] Date of Birth: 01/14/1951  Today's Date: 04/20/2014 Time: [id number] SLP Time Calculation (min): 20 min  Past Medical History:  Past Medical History  Diagnosis Date  . PAD (peripheral artery disease)   . Hypertension   . Cocaine abuse   . ETOH abuse   . Tobacco abuse   . Cholelithiases 05/01/2012  . Dysrhythmia     afib  . CHF (congestive heart failure)   . Blood transfusion without reported diagnosis   . Hepatitis C antibody test positive 04/2012   Past Surgical History:  Past Surgical History  Procedure Laterality Date  . Endovascular stent insertion  right leg  . Tee without cardioversion  05/05/2012    Procedure: TRANSESOPHAGEAL ECHOCARDIOGRAM (TEE);  Surgeon: Thurmon FairMihai Croitoru, MD;  [facility];  Service: Cardiovascular;  Laterality: N/A;  . Cardioversion  05/05/2012    Procedure: CARDIOVERSION;  Surgeon: Thurmon FairMihai Croitoru, MD;  Location: [facility] ENDOSCOPY;  Service: Cardiovascular;  Laterality: N/A;   HPI:  63 y.o. male with PMH: PAD with a right lower extremity stents, A. fib, HTN, polysubstance abuse-tobacco, alcohol, cocaine & THC, chronic thrombocytopenia, positive hepatitis C and abdominal ultrasound suspicious for cirrhosis in 2013, medication noncompliance admitted with complaints of difficulty breathing. Per MD note pt. started having difficulty swallowing and felt that the pills were getting stuck in his throat despite trying to wash them down with liquids. Pt. denied difficulty swallowing food or liquids per MD note. He states that he normally eats food slowly and chews for a long time and denies dysphagia. Wife indicates that he may have lost about 10 pounds in the last 8-9 months.  CXR 7/28 no acute cardiopulmonary abnormality seen.   Assessment / Plan / Recommendation Clinical Impression  SLP suspects both physiological and pscychological etiologies leading to diffulty  swallowing larger pills.  Pt. describes symptoms similar to esophageal deficits such as globus sensation, indigestion.  He also experienced mildly compromised respiratory state while consuming a large pill which pt. reports as being traumatic and causing much anxiety with larger pills.  SLP educated pt. and wife on strategies to mitigate symptoms such as consuming whole pill or half pill in teaspoon applesauce, yogurt or pudding.  Crushing pill is alternative if whole is difficult.  Additional strategies include alternate liquids after every several bites, stay upright after meals for 45 minutes and take breaks if full sensation during meals.  Pt. and wife verbalized understanding.  No indications of aspiration or pharyngeal dysphagia durign evaluation and recommend continue regular texture and thin liquids; no further ST needed.     Aspiration Risk  Mild    Diet Recommendation Regular;Thin liquid   Liquid Administration via: Cup;Straw Medication Administration: Whole meds with puree Supervision: Patient able to self feed Compensations: Slow rate;Small sips/bites;Follow solids with liquid Postural Changes and/or Swallow Maneuvers: Seated upright 90 degrees;Upright 30-60 min after meal;Out of bed for meals    Other  Recommendations Oral Care Recommendations: Oral care BID   Follow Up Recommendations  None    Frequency and Duration        Pertinent Vitals/Pain WDL         Swallow Study         Oral/Motor/Sensory Function Overall Oral Motor/Sensory Function: Appears within functional limits for tasks assessed   Ice Chips Ice chips: Not tested   Thin Liquid Thin Liquid: Within functional limits    Nectar Thick Nectar Thick Liquid: Not tested  Honey Thick Honey Thick Liquid: Not tested   Puree Puree: Not tested   Solid       Solid: Within functional limits       Royce Macadamia M.Ed [employer] [phone]  04/20/2014

## 2014-04-20 NOTE — Progress Notes (Signed)
ANTICOAGULATION CONSULT NOTE Pharmacy Consult:  Heparin Indication: Afib  No Known Allergies  Patient Measurements: Height: 5' 10.87" (180 cm) Weight: 199 lb 15.3 oz (90.7 kg) IBW/kg (Calculated) : 74.99   Vital Signs: Temp: 98 F (36.7 C) (07/29 0700) Temp src: Oral (07/29 0700) BP: 158/89 mmHg (07/29 0911) Pulse Rate: 92 (07/29 0911)  Labs:  Recent Labs  04/19/14 0904 04/19/14 1910 04/19/14 2300 04/20/14 0831  HGB 13.9  --   --  13.6  HCT 41.1  --   --  40.7  PLT 62*  --   --  59*  LABPROT 14.7  --   --   --   INR 1.15  --   --   --   HEPARINUNFRC  --   --  0.10* 0.34  CREATININE 1.37*  --   --   --   TROPONINI <0.30 <0.30  --   --     Estimated Creatinine Clearance: 64.3 ml/min (by C-G formula based on Cr of 1.37).  Assessment: 63 yo male with Afib on heparin at 1450 nunits/hr and now noted at goal (HL= 0.34). Hg/hct= 13.6/40.7 and plt= 58K (chronic thrombocytopenia). Patient noted for likely DCCV.  Goal of Therapy:  Heparin level 0.3-0.7 units/ml Monitor platelets by anticoagulation protocol: Yes   Plan:  -No heparin changes needed -Will recheck a heparin level today  Harland German, Pharm D 04/20/2014 9:34 AM

## 2014-04-20 NOTE — Progress Notes (Signed)
Utilization review completed.  

## 2014-04-21 DIAGNOSIS — F17200 Nicotine dependence, unspecified, uncomplicated: Secondary | ICD-10-CM

## 2014-04-21 DIAGNOSIS — N289 Disorder of kidney and ureter, unspecified: Secondary | ICD-10-CM

## 2014-04-21 LAB — BASIC METABOLIC PANEL
Anion gap: 12 (ref 5–15)
Anion gap: 13 (ref 5–15)
BUN: 29 mg/dL — ABNORMAL HIGH (ref 6–23)
BUN: 27 mg/dL — ABNORMAL HIGH (ref 6–23)
CO2: 23 mEq/L (ref 19–32)
CO2: 25 mEq/L (ref 19–32)
Calcium: 8.5 mg/dL (ref 8.4–10.5)
Calcium: 8.6 mg/dL (ref 8.4–10.5)
Chloride: 101 mEq/L (ref 96–112)
Chloride: 101 mEq/L (ref 96–112)
Creatinine, Ser: 2.04 mg/dL — ABNORMAL HIGH (ref 0.50–1.35)
Creatinine, Ser: 2.04 mg/dL — ABNORMAL HIGH (ref 0.50–1.35)
GFR calc Af Amer: 39 mL/min — ABNORMAL LOW (ref >90)
GFR calc Af Amer: 39 mL/min — ABNORMAL LOW (ref >90)
GFR calc non Af Amer: 33 mL/min — ABNORMAL LOW (ref >90)
GFR calc non Af Amer: 33 mL/min — ABNORMAL LOW (ref >90)
Glucose, Bld: 120 mg/dL — ABNORMAL HIGH (ref 70–99)
Glucose, Bld: 133 mg/dL — ABNORMAL HIGH (ref 70–99)
Potassium: 3.9 mEq/L (ref 3.7–5.3)
Potassium: 4.1 mEq/L (ref 3.7–5.3)
Sodium: 136 mEq/L — ABNORMAL LOW (ref 137–147)
Sodium: 139 mEq/L (ref 137–147)

## 2014-04-21 LAB — CBC
HCT: 39.4 % (ref 39.0–52.0)
Hemoglobin: 13 g/dL (ref 13.0–17.0)
MCH: 33 pg (ref 26.0–34.0)
MCHC: 33 g/dL (ref 30.0–36.0)
MCV: 100 fL (ref 78.0–100.0)
Platelets: 57 10*3/uL — ABNORMAL LOW (ref 150–400)
RBC: 3.94 MIL/uL — ABNORMAL LOW (ref 4.22–5.81)
RDW: 12.3 % (ref 11.5–15.5)
WBC: 9.1 10*3/uL (ref 4.0–10.5)

## 2014-04-21 MED ORDER — FUROSEMIDE 10 MG/ML IJ SOLN
20.0000 mg | Freq: Every day | INTRAMUSCULAR | Status: DC
  Administered 2014-04-21: 20 mg via INTRAVENOUS
  Filled 2014-04-21: qty 2

## 2014-04-21 MED ORDER — ASPIRIN 325 MG PO TABS
325.0000 mg | ORAL_TABLET | Freq: Every day | ORAL | Status: DC
  Administered 2014-04-21 – 2014-04-23 (×3): 325 mg via ORAL
  Filled 2014-04-21 (×3): qty 1

## 2014-04-21 NOTE — Progress Notes (Signed)
     SUBJECTIVE: Breathing is better. NO pain.   BP 123/99  Pulse 100  Temp(Src) 97.2 F (36.2 C) (Axillary)  Resp 16  Ht 5' 10.87" (1.8 m)  Wt 200 lb 9.9 oz (91 kg)  BMI 28.09 kg/m2  SpO2 96%  Intake/Output Summary (Last 24 hours) at 04/21/14 1108 Last data filed at 04/21/14 1048  Gross per 24 hour  Intake    840 ml  Output    600 ml  Net    240 ml    PHYSICAL EXAM General: Well developed, well nourished, in no acute distress. Alert and oriented x 3.  Psych:  Good affect, responds appropriately Neck: No JVD. No masses noted.  Lungs: Clear bilaterally with no wheezes or rhonci noted.  Heart: Irreg irreg with no murmurs noted. Abdomen: Bowel sounds are present. Soft, non-tender.  Extremities: No lower extremity edema.   LABS: Basic Metabolic Panel:  Recent Labs  46/65/99 0904 04/21/14 0450 04/21/14 0853  NA 139 136* 139  K 3.6* 3.9 4.1  CL 102 101 101  CO2 21 23 25   GLUCOSE 115* 120* 133*  BUN 19 29* 27*  CREATININE 1.37* 2.04* 2.04*  CALCIUM 8.9 8.5 8.6  MG 1.8  --   --    CBC:  Recent Labs  04/19/14 0904 04/20/14 0831 04/21/14 0450  WBC 9.2 7.9 9.1  NEUTROABS 7.6  --   --   HGB 13.9 13.6 13.0  HCT 41.1 40.7 39.4  MCV 99.3 100.2* 100.0  PLT 62* 59* 57*   Cardiac Enzymes:  Recent Labs  04/19/14 0904 04/19/14 1910  TROPONINI <0.30 <0.30   Current Meds: . carvedilol  12.5 mg Oral BID WC  . diltiazem  240 mg Oral Daily  . furosemide  20 mg Intravenous Daily  . potassium chloride  20 mEq Oral BID  . sodium chloride  3 mL Intravenous Q12H   Echo 04/20/14: Left ventricle: The cavity size was normal. Wall thickness was increased in a pattern of mild LVH. Systolic function was normal. The estimated ejection fraction was in the range of 55% to 60%. Wall motion was normal; there were no regional wall motion abnormalities. - Mitral valve: There was mild regurgitation. - Left atrium: The atrium was mildly dilated. - Right atrium: The atrium was  mildly dilated. - Tricuspid valve: There was moderate regurgitation.  ASSESSMENT AND PLAN:  1. Atrial fib with RVR: Rate controlled on Cardizem CD 240 mg Qdaily. Per records, on Xarelto in past but stopped due to thrombocytopenia. Not a good coumadin candidate due to non-compliance. Agree that ASA 325 mg po Qdaily may be best option right now. Agree with stopping heparin drip. His primary cardiologist is Dr. Rennis Golden.  Cardiac follow up will be with Dr. Rennis Golden.   2. Acute on chronic diastolic CHF: Diuresing well with IV Lasix. Renal function worse this am. Agree with decreasing dose of Lasix. Check BMET in am. Echo 04/20/14 with normal LV function.   3. Uncontrolled HTN: BP better controlled.    4. Polysubstance abuse: Cessation advised.     MCALHANY,CHRISTOPHER  7/30/201511:08 AM

## 2014-04-21 NOTE — Progress Notes (Signed)
[facility] PROGRESS NOTE  Kevin [name] VHQ:[id number] DOB: 31-Dec-1950 DOA: 04/19/2014 PCP: Jackie PlumSEI-BONSU,GEORGE, MD  Assessment/Plan: 1. A. fib with RVR:  -off Cardizem gtt, now on Cardizem CD  -continue coreg -2-D echo with preserved EF, TSH 4.220 -no plans for TEE and cardioversion at this time -Per Cards, recommended considering Xarelto, however this was stopped in 2013 due to his severe/chronic thrombocytopenia -and per PharmD, In rocket trial for Xarelto excluded pt with plts <90K due to risk of hemorrhage -he would be a poor coumadin condidate also due to bleeding risk and long h/o non compliance, -ASA 325mg  daily if [facility] with Cards  2. Acute on chronic diastolic CHF  -improving, negative 1.4L -given rise in creatinine will cut down lasix dose -2D ECHO with preserved EF -Do not suspect PE  3. Accelerated hypertension: Secondary to noncompliance  -improving, conitnue carvedilol, diltiazem and PRN hydralazine  4. Polysubstance abuse-cocaine/THC/tobacco/alcohol: He has quit tobacco and alcohol in March 2015. Counseled cessation from cocaine and THC. He verbalized understanding.  5. Dysphagia: in part psychological     -Speech therapy eval completed, regular diet, thin liquids and crushing pills recommended  6. Cirrhosis noted on Imaging, likely multifactorial, l;ong term alcoholic, quite in March and Hepatitis C antibody positive      -Ultrasound in 2013 suggestive of cirrhosis      -FU hepatitis C antibody and viral load. Outpatient followup in infectious disease clinic.  7. Chronic thrombocytopenia: Seems to be at baseline. Likely related to liver disease, increased risk of bleeding with anticoagulation  8. Aki on chronic kidney disease: ' - due to diuresis, cut down lasix, bmet in am  9. Medication noncompliance:  -counseled  Code Status: Full Code Family Communication: none at bedside Disposition Plan: Tx to  tele   Consultants:  cards  HPI/Subjective: Feels ok, breathing better  Objective: Filed Vitals:   04/21/14 0434  BP: 165/89  Pulse: 80  Temp: 97.7 F (36.5 C)  Resp: 23    Intake/Output Summary (Last 24 hours) at 04/21/14 0741 Last data filed at 04/20/14 1818  Gross per 24 hour  Intake    480 ml  Output   1000 ml  Net   -520 ml   Filed Weights   04/19/14 1554 04/20/14 0400 04/21/14 0434  Weight: 90.719 kg (200 lb) 90.7 kg (199 lb 15.3 oz) 91 kg (200 lb 9.9 oz)    Exam:   General:  AAOx3  Cardiovascular: Irregular rate and rhythm  Respiratory: fine basilar crackles  Abdomen: soft, NT, ND, BS present  Musculoskeletal: no edema c/c  Data Reviewed: Basic Metabolic Panel:  Recent Labs Lab 04/19/14 0904 04/21/14 0450  NA 139 136*  K 3.6* 3.9  CL 102 101  CO2 21 23  GLUCOSE 115* 120*  BUN 19 29*  CREATININE 1.37* 2.04*  CALCIUM 8.9 8.5  MG 1.8  --    Liver Function Tests:  Recent Labs Lab 04/19/14 0904  AST 29  ALT 26  ALKPHOS 87  BILITOT 1.3*  PROT 7.0  ALBUMIN 3.7   No results found for this basename: LIPASE, AMYLASE,  in the last 168 hours No results found for this basename: AMMONIA,  in the last 168 hours CBC:  Recent Labs Lab 04/19/14 0904 04/20/14 0831 04/21/14 0450  WBC 9.2 7.9 9.1  NEUTROABS 7.6  --   --   HGB 13.9 13.6 13.0  HCT 41.1 40.7 39.4  MCV 99.3 100.2* 100.0  PLT 62* 59* 57*   Cardiac Enzymes:  Recent Labs Lab 04/19/14 0904 04/19/14 1910  TROPONINI <0.30 <0.30   BNP (last 3 results)  Recent Labs  04/19/14 0904  PROBNP 4629.0*   CBG:  Recent Labs Lab 04/20/14 2036  GLUCAP 117*    Recent Results (from the past 240 hour(s))  MRSA PCR SCREENING     Status: None   Collection Time    04/19/14  7:10 PM      Result Value Ref Range Status   MRSA by PCR NEGATIVE  NEGATIVE Final   Comment:            The GeneXpert MRSA Assay (FDA     approved for NASAL specimens     only), is one component of a      comprehensive MRSA colonization     surveillance program. It is not     intended to diagnose MRSA     infection nor to guide or     monitor treatment for     MRSA infections.     Studies: Dg Chest Portable 1 View  04/19/2014   CLINICAL DATA:  Shortness of breath.  EXAM: PORTABLE CHEST - 1 VIEW  COMPARISON:  June 23, 2013.  FINDINGS: The heart size and mediastinal contours are within normal limits. Both lungs are clear. No pneumothorax or pleural effusion is noted. The visualized skeletal structures are unremarkable.  IMPRESSION: No acute cardiopulmonary abnormality seen.   Electronically Signed   By: Roque Lias M.D.   On: 04/19/2014 09:22    Scheduled Meds: . carvedilol  12.5 mg Oral BID WC  . diltiazem  240 mg Oral Daily  . furosemide  20 mg Intravenous Daily  . potassium chloride  20 mEq Oral BID  . sodium chloride  3 mL Intravenous Q12H   Continuous Infusions:   Antibiotics Given (last 72 hours)   None      Principal Problem:   Atrial fibrillation with rapid ventricular response Active Problems:   Cocaine abuse   HTN (hypertension) -Accelerated with End organ involvement   Thrombocytopenia on admission 04/30/12, thought to be secondary to Xarelto, Platelets no better 2015   DOE (dyspnea on exertion)   Hepatitis C antibody test positive   Diastolic CHF, acute on chronic   Atrial fibrillation with RVR   Dysphagia    Time spent:    [employer]  [facility] Pager [phone]. If 7PM-7AM, please contact night-coverage at [email or web address], password [facility] 04/21/2014, 7:41 AM  LOS: 2 days

## 2014-04-21 NOTE — Evaluation (Signed)
Physical Therapy Evaluation Patient Details Name: Kevin [name] MRN: [id number] DOB: [birth year] Today's Date: 04/21/2014   History of Present Illness  63 y/o male came to ER with difficulty breathing and found to be in A-fib with RVR.  PMH includes Hep C, polysubstance abuse, cirrhosis, and medical non-compliance.  Clinical Impression  Pt admitted with SOB. Pt currently with functional limitations due to the deficits listed below (see PT Problem List).  Pt will benefit from skilled PT to increase their independence and safety with mobility to allow discharge to the venue listed below. Will see pt 1-2 more visits to assess safety on stairs one more time and further education on proper breathing techniques and energy conservation as needed.     Follow Up Recommendations No PT follow up    Equipment Recommendations  None recommended by PT    Recommendations for Other Services       Precautions / Restrictions Precautions Precautions: None      Mobility  Bed Mobility Overal bed mobility: Independent                Transfers Overall transfer level: Independent                  Ambulation/Gait Ambulation/Gait assistance: Modified independent (Device/Increase time) Ambulation Distance (Feet): 250 Feet Assistive device: None Gait Pattern/deviations: Step-through pattern     General Gait Details: Pt with no LOB during gait.  He has dyspnea with o2 level 100% after gait while on room air.  Stairs Stairs: Yes Stairs assistance: Supervision Stair Management: One rail Right;Alternating pattern;Step to pattern;Sideways;Forwards Number of Stairs: 20 General stair comments: Pt requiring rest break at top of stairs.  He ascended with forward step through pattern and descended with a slight sideways pattern alternating between step to pattern and step throuhg pattern. O2 at end of staris 98-100% with c/o feeling SOB.  Wheelchair Mobility    Modified Rankin (Stroke  Patients Only)       Balance Overall balance assessment: No apparent balance deficits (not formally assessed)                                           Pertinent Vitals/Pain No pain.  o2 98-100% on room air.    Home Living Family/patient expects to be discharged to:: Private residence Living Arrangements: Spouse/significant other Available Help at Discharge: Family Type of Home: Apartment Home Access: Stairs to enter     Home Layout: Two level;1/2 bath on main level;Bed/bath upstairs Home Equipment: None      Prior Function Level of Independence: Independent               Hand Dominance        Extremity/Trunk Assessment   Upper Extremity Assessment: Overall WFL for tasks assessed           Lower Extremity Assessment: Overall WFL for tasks assessed      Cervical / Trunk Assessment: Normal  Communication   Communication: No difficulties  Cognition Arousal/Alertness: Awake/alert Behavior During Therapy: WFL for tasks assessed/performed Overall Cognitive Status: Within Functional Limits for tasks assessed                      General Comments      Exercises Other Exercises Other Exercises: Pursed lip breathing exercises      Assessment/Plan    PT Assessment  Patient needs continued PT services  PT Diagnosis Difficulty walking   PT Problem List Decreased activity tolerance  PT Treatment Interventions Stair training;Therapeutic exercise   PT Goals (Current goals can be found in the Care Plan section) Acute Rehab PT Goals Patient Stated Goal: To not be so SOB. PT Goal Formulation: With patient Time For Goal Achievement: 04/26/14 Potential to Achieve Goals: Good    Frequency Min 2X/week   Barriers to discharge        Co-evaluation               End of Session Equipment Utilized During Treatment: Gait belt Activity Tolerance: Patient tolerated treatment well Patient left: in chair;with call bell/phone  within reach Nurse Communication: Mobility status         Time: [id number] PT Time Calculation (min): 24 min   Charges:   PT Evaluation $Initial PT Evaluation Tier I: 1 Procedure PT Treatments $Gait Training: 8-22 mins   PT G Codes:          Bren Steers LUBECK 04/21/2014, 5:05 PM

## 2014-04-22 LAB — BASIC METABOLIC PANEL
Anion gap: 15 (ref 5–15)
BUN: 32 mg/dL — ABNORMAL HIGH (ref 6–23)
CO2: 21 mEq/L (ref 19–32)
Calcium: 8.4 mg/dL (ref 8.4–10.5)
Chloride: 101 mEq/L (ref 96–112)
Creatinine, Ser: 2.12 mg/dL — ABNORMAL HIGH (ref 0.50–1.35)
GFR calc Af Amer: 37 mL/min — ABNORMAL LOW (ref >90)
GFR calc non Af Amer: 32 mL/min — ABNORMAL LOW (ref >90)
Glucose, Bld: 118 mg/dL — ABNORMAL HIGH (ref 70–99)
Potassium: 4.3 mEq/L (ref 3.7–5.3)
Sodium: 137 mEq/L (ref 137–147)

## 2014-04-22 LAB — HCV RNA QUANT
HCV Quantitative Log: 5.91 {Log} — ABNORMAL HIGH (ref <1.18)
HCV Quantitative: 820537 IU/mL — ABNORMAL HIGH (ref <15)

## 2014-04-22 MED ORDER — FUROSEMIDE 20 MG PO TABS
20.0000 mg | ORAL_TABLET | Freq: Every day | ORAL | Status: DC
  Filled 2014-04-22: qty 1

## 2014-04-22 MED ORDER — POTASSIUM CHLORIDE 20 MEQ/15ML (10%) PO LIQD
20.0000 meq | Freq: Every day | ORAL | Status: DC
  Administered 2014-04-22 – 2014-04-23 (×2): 20 meq via ORAL
  Filled 2014-04-22 (×2): qty 15

## 2014-04-22 NOTE — Progress Notes (Addendum)
[facility] PROGRESS NOTE  AHMIR DELANGEL CWC:[id number] DOB: [birth year] DOA: 04/19/2014 PCP: Jackie Plum, MD  Assessment/Plan: 1. A. fib with RVR: resolved -off Cardizem gtt, now on Cardizem CD  -continue coreg -2-D echo with preserved EF, TSH 4.220 -no plans for TEE and cardioversion at this time -Per Cards, recommended considering Xarelto, however this was stopped in 2013 due to his severe/chronic thrombocytopenia -and per PharmD, In rocket trial for Xarelto excluded pt with plts <90K due to risk of hemorrhage -he would be a poor coumadin condidate also due to bleeding risk and long h/o non compliance, -ASA 325mg  daily  2. Acute on chronic diastolic CHF  -improving, negative 1.4L,. I/O not recorded yesterday -creatinine still up despite cutting back on lasix, ? Hold or stop lasix -2D ECHO with preserved EF  3. Accelerated hypertension: Secondary to noncompliance  -improving, conitnue carvedilol, diltiazem and PRN hydralazine  4. Polysubstance abuse-cocaine/THC/tobacco/alcohol: He has quit tobacco and alcohol in March 2015. Counseled cessation from cocaine and THC. He verbalized understanding.  5. Dysphagia: in part psychological     -Speech therapy eval completed, regular diet, thin liquids and crushing pills recommended  6. Cirrhosis noted on Imaging, likely multifactorial, l;ong term alcoholic, quite in March and Hepatitis C antibody positive      -Ultrasound in 2013 suggestive of cirrhosis      -FU hepatitis C antibody and viral load. Outpatient followup in infectious disease clinic.  7. Chronic thrombocytopenia: Seems to be at baseline. Likely related to liver disease, increased risk of bleeding with anticoagulation  8. Aki on chronic kidney disease: ' - due to diuresis, cut down lasix, change to PO, bmet in am  9. Medication noncompliance:  -counseled  Code Status: Full Code Family Communication: none at bedside Disposition Plan: home tomorrow if  stable kidney function   Consultants:  cards  HPI/Subjective: Feels ok, breathing better, eating and able to take meds  Objective: Filed Vitals:   04/22/14 0532  BP: 128/62  Pulse: 72  Temp: 97.8 F (36.6 C)  Resp: 16    Intake/Output Summary (Last 24 hours) at 04/22/14 0755 Last data filed at 04/21/14 1500  Gross per 24 hour  Intake    600 ml  Output      0 ml  Net    600 ml   Filed Weights   04/20/14 0400 04/21/14 0434 04/22/14 0532  Weight: 90.7 kg (199 lb 15.3 oz) 91 kg (200 lb 9.9 oz) 91.5 kg (201 lb 11.5 oz)    Exam:   General:  AAOx3  Cardiovascular: Irregular rate and rhythm  Respiratory: fine basilar crackles  Abdomen: soft, NT, ND, BS present  Musculoskeletal: no edema c/c  Data Reviewed: Basic Metabolic Panel:  Recent Labs Lab 04/19/14 0904 04/21/14 0450 04/21/14 0853 04/22/14 0625  NA 139 136* 139 137  K 3.6* 3.9 4.1 4.3  CL 102 101 101 101  CO2 21 23 25 21   GLUCOSE 115* 120* 133* 118*  BUN 19 29* 27* 32*  CREATININE 1.37* 2.04* 2.04* 2.12*  CALCIUM 8.9 8.5 8.6 8.4  MG 1.8  --   --   --    Liver Function Tests:  Recent Labs Lab 04/19/14 0904  AST 29  ALT 26  ALKPHOS 87  BILITOT 1.3*  PROT 7.0  ALBUMIN 3.7   No results found for this basename: LIPASE, AMYLASE,  in the last 168 hours No results found for this basename: AMMONIA,  in the last 168 hours CBC:  Recent Labs Lab  04/19/14 0904 04/20/14 0831 04/21/14 0450  WBC 9.2 7.9 9.1  NEUTROABS 7.6  --   --   HGB 13.9 13.6 13.0  HCT 41.1 40.7 39.4  MCV 99.3 100.2* 100.0  PLT 62* 59* 57*   Cardiac Enzymes:  Recent Labs Lab 04/19/14 0904 04/19/14 1910  TROPONINI <0.30 <0.30   BNP (last 3 results)  Recent Labs  04/19/14 0904  PROBNP 4629.0*   CBG:  Recent Labs Lab 04/20/14 2036  GLUCAP 117*    Recent Results (from the past 240 hour(s))  MRSA PCR SCREENING     Status: None   Collection Time    04/19/14  7:10 PM      Result Value Ref Range Status    MRSA by PCR NEGATIVE  NEGATIVE Final   Comment:            The GeneXpert MRSA Assay (FDA     approved for NASAL specimens     only), is one component of a     comprehensive MRSA colonization     surveillance program. It is not     intended to diagnose MRSA     infection nor to guide or     monitor treatment for     MRSA infections.     Studies: No results found.  Scheduled Meds: . aspirin  325 mg Oral Daily  . carvedilol  12.5 mg Oral BID WC  . diltiazem  240 mg Oral Daily  . furosemide  20 mg Oral Daily  . potassium chloride  20 mEq Oral Daily  . sodium chloride  3 mL Intravenous Q12H   Continuous Infusions:   Antibiotics Given (last 72 hours)   None      Principal Problem:   Atrial fibrillation with rapid ventricular response Active Problems:   Cocaine abuse   HTN (hypertension) -Accelerated with End organ involvement   Thrombocytopenia on admission 04/30/12, thought to be secondary to Xarelto, Platelets no better 2015   DOE (dyspnea on exertion)   Hepatitis C antibody test positive   Diastolic CHF, acute on chronic   Atrial fibrillation with RVR   Dysphagia    Time spent: 25min    Banner-University Medical Center Tucson CampusJOSEPH,Lenise Jr  [facility] Pager [phone]. If 7PM-7AM, please contact night-coverage at [email or web address], password [email or web address] 04/22/2014, 7:55 AM  LOS: 3 days

## 2014-04-22 NOTE — Care Management Note (Unsigned)
    Page 1 of 1   04/23/2014     8:26:13 AM CARE MANAGEMENT NOTE 04/23/2014  Patient:  Kevin [name], Kevin [name]   Account Number:  [id number]  Date Initiated:  04/22/2014  Documentation initiated by:  AMERSON,JULIE  Subjective/Objective Assessment:   Pt adm on 04/19/14 with Afib with RVR, CHF.  PTA, pt independent, lives with spouse.     Action/Plan:   PT recommending no follow up at dc.  Will follow for dc needs.   Anticipated DC Date:  04/24/2014   Anticipated DC Plan:  HOME/SELF CARE      DC Planning Services  CM consult      Choice offered to / List presented to:             Status of service:  In process, will continue to follow Medicare Important Message given?   (If response is "NO", the following Medicare IM given date fields will be blank) Date Medicare IM given:   Medicare IM given by:   Date Additional Medicare IM given:   Additional Medicare IM given by:    Discharge Disposition:    Per UR Regulation:  Reviewed for med. necessity/level of care/duration of stay  If discussed at Long Length of Stay Meetings, dates discussed:    Comments:  04/23/14 08:00 CM notes consult is for med asst; as pt has insurance for medication he is not eligible for MATCH. Request for [facility] for med mgmt made.  [facility] will follow for orders and HH arrangements.  Freddy Jaksch, BSN, CM [phone].

## 2014-04-22 NOTE — Progress Notes (Addendum)
     SUBJECTIVE: Feels much better. No chest pain or SOB.   BP 128/62  Pulse 72  Temp(Src) 97.8 F (36.6 C) (Oral)  Resp 16  Ht 5' 10.87" (1.8 m)  Wt 201 lb 11.5 oz (91.5 kg)  BMI 28.24 kg/m2  SpO2 100%  Intake/Output Summary (Last 24 hours) at 04/22/14 0830 Last data filed at 04/21/14 1500  Gross per 24 hour  Intake    600 ml  Output      0 ml  Net    600 ml    PHYSICAL EXAM General: Well developed, well nourished, in no acute distress. Alert and oriented x 3.  Psych:  Good affect, responds appropriately Neck: No JVD. No masses noted.  Lungs: Clear bilaterally with no wheezes or rhonci noted.  Heart: Irreg irreg with no murmurs noted. Abdomen: Bowel sounds are present. Soft, non-tender.  Extremities: No lower extremity edema.   LABS: Basic Metabolic Panel:  Recent Labs  03/75/43 0904  04/21/14 0853 04/22/14 0625  NA 139  < > 139 137  K 3.6*  < > 4.1 4.3  CL 102  < > 101 101  CO2 21  < > 25 21  GLUCOSE 115*  < > 133* 118*  BUN 19  < > 27* 32*  CREATININE 1.37*  < > 2.04* 2.12*  CALCIUM 8.9  < > 8.6 8.4  MG 1.8  --   --   --   < > = values in this interval not displayed. CBC:  Recent Labs  04/19/14 0904 04/20/14 0831 04/21/14 0450  WBC 9.2 7.9 9.1  NEUTROABS 7.6  --   --   HGB 13.9 13.6 13.0  HCT 41.1 40.7 39.4  MCV 99.3 100.2* 100.0  PLT 62* 59* 57*   Cardiac Enzymes:  Recent Labs  04/19/14 0904 04/19/14 1910  TROPONINI <0.30 <0.30   Current Meds: . aspirin  325 mg Oral Daily  . carvedilol  12.5 mg Oral BID WC  . diltiazem  240 mg Oral Daily  . furosemide  20 mg Oral Daily  . potassium chloride  20 mEq Oral Daily  . sodium chloride  3 mL Intravenous Q12H   ASSESSMENT AND PLAN:  1. Atrial fib with RVR: Rate controlled on Cardizem CD 240 mg Qdaily. Per records, on Xarelto in past but stopped due to thrombocytopenia. Not a good coumadin candidate due to non-compliance, poly-substance abuse. Agree that ASA 325 mg po Qdaily may be best  option right now. His primary cardiologist is Dr. Rennis Golden. Cardiac follow up will be with Dr. Rennis Golden.   2. Acute on chronic diastolic CHF: He has diuresed 1 liter with IV Lasix since admission but renal function has worsened. Would hold Lasix. Follow renal function closely. Echo 04/20/14 with normal LV function.   3. Uncontrolled HTN: BP better controlled.   4. Polysubstance abuse: Cessation advised.   5. Acute renal insufficiency: Likely due to diuresis. Lasix held this am. Repeat BMET in am.    MCALHANY,Kevin [name]  7/31/20158:30 AM

## 2014-04-23 DIAGNOSIS — K7460 Unspecified cirrhosis of liver: Secondary | ICD-10-CM | POA: Diagnosis present

## 2014-04-23 LAB — BASIC METABOLIC PANEL
Anion gap: 13 (ref 5–15)
BUN: 30 mg/dL — ABNORMAL HIGH (ref 6–23)
CO2: 22 mEq/L (ref 19–32)
Calcium: 8 mg/dL — ABNORMAL LOW (ref 8.4–10.5)
Chloride: 103 mEq/L (ref 96–112)
Creatinine, Ser: 1.62 mg/dL — ABNORMAL HIGH (ref 0.50–1.35)
GFR calc Af Amer: 51 mL/min — ABNORMAL LOW (ref >90)
GFR calc non Af Amer: 44 mL/min — ABNORMAL LOW (ref >90)
Glucose, Bld: 106 mg/dL — ABNORMAL HIGH (ref 70–99)
Potassium: 4.3 mEq/L (ref 3.7–5.3)
Sodium: 138 mEq/L (ref 137–147)

## 2014-04-23 MED ORDER — DILTIAZEM HCL ER COATED BEADS 240 MG PO CP24: 240.0000 mg | ORAL_CAPSULE | Freq: Every day | ORAL | Status: DC

## 2014-04-23 MED ORDER — ASPIRIN 325 MG PO TABS: 325.0000 mg | ORAL_TABLET | Freq: Every day | ORAL | Status: DC

## 2014-04-23 MED ORDER — CARVEDILOL 12.5 MG PO TABS: 12.5000 mg | ORAL_TABLET | Freq: Two times a day (BID) | ORAL | Status: DC

## 2014-04-23 NOTE — Progress Notes (Addendum)
   SUBJECTIVE: Feels better. No chest pain or SOB.   BP 137/80  Pulse 79  Temp(Src) 98.6 F (37 C) (Oral)  Resp 19  Ht 5' 10.87" (1.8 m)  Wt 215 lb 13.3 oz (97.9 kg)  BMI 30.22 kg/m2  SpO2 99%  Intake/Output Summary (Last 24 hours) at 04/23/14 5102 Last data filed at 04/22/14 1300  Gross per 24 hour  Intake    360 ml  Output      0 ml  Net    360 ml   PHYSICAL EXAM General: Well developed, well nourished, in no acute distress. Alert and oriented x 3.  Psych:  Good affect, responds appropriately Neck: No JVD. No masses noted.  Lungs: Clear bilaterally with no wheezes or rhonci noted.  Heart: Irreg irreg with no murmurs noted. Abdomen: Bowel sounds are present. Soft, non-tender.  Extremities: No lower extremity edema.   LABS: Basic Metabolic Panel:  Recent Labs  58/52/77 0625 04/23/14 0325  NA 137 138  K 4.3 4.3  CL 101 103  CO2 21 22  GLUCOSE 118* 106*  BUN 32* 30*  CREATININE 2.12* 1.62*  CALCIUM 8.4 8.0*   CBC:  Recent Labs  04/21/14 0450  WBC 9.1  HGB 13.0  HCT 39.4  MCV 100.0  PLT 57*   Current Meds: . aspirin  325 mg Oral Daily  . carvedilol  12.5 mg Oral BID WC  . diltiazem  240 mg Oral Daily  . potassium chloride  20 mEq Oral Daily  . sodium chloride  3 mL Intravenous Q12H    ASSESSMENT AND PLAN:  1. Atrial fib with RVR: Rate controlled on Cardizem CD 240 mg Qdaily. Per records, on Xarelto in past but stopped due to thrombocytopenia. Not a good coumadin candidate due to non-compliance, poly-substance abuse. Agree that ASA 325 mg po Qdaily may be best option right now. His primary cardiologist is Dr. Rennis Golden. Cardiac follow up will be with Dr. Rennis Golden.   2. Acute on chronic diastolic CHF: LVEF 55-60%, He has diuresed 1 liter with IV Lasix since admission but renal function has worsened. Lasix was held. Weight check unreliable (15 lbs difference from day to day) and no output recorded  3. HTN: BP controlled now.   4. Polysubstance abuse:  Cessation advised.   5. Acute renal insufficiency: improving Crea 2.1 --> 1.6, would hold lasix until the next visit with Dr Rennis Golden.    6. Possible OSA - we recommend sleep study    Kevin [name] 8/1/20159:09 AM

## 2014-04-23 NOTE — Discharge Summary (Signed)
Physician Discharge Summary  Kevin Polesdward T Kooi BMW:[id number]:[id number] DOB: 23-Jul-1951 DOA: 04/19/2014  PCP: Jackie PlumSEI-BONSU,GEORGE, MD  Admit date: 04/19/2014 Discharge date: 04/23/2014  Time spent: 35 minutes  Recommendations for Outpatient Follow-up:  1. Dr.Hilty in 1 week, consider resuming lasix depending on volume status/labs 2. Infectious disease clinic in 1 month, for Hep C 3. PCP in 1 week, needs referral for sleep study  Discharge Diagnoses:  Principal Problem:   Atrial fibrillation with rapid ventricular response Active Problems:   Cocaine abuse   HTN (hypertension) -Accelerated with End organ involvement   Thrombocytopenia on admission 04/30/12, thought to be secondary to Xarelto, Platelets no better 2015   DOE (dyspnea on exertion)   Hepatitis C antibody test positive   Diastolic CHF, acute on chronic   Atrial fibrillation with RVR   Dysphagia   Cirrhosis of liver   H/o alcohol abuse  Discharge Condition:stable  Diet recommendation: low sodium  Filed Weights   04/21/14 0434 04/22/14 0532 04/23/14 0523  Weight: 91 kg (200 lb 9.9 oz) 91.5 kg (201 lb 11.5 oz) 97.9 kg (215 lb 13.3 oz)    History of present illness:  Chief Complaint: Difficulty breathing.  HPI: Kevin [name] is a 63 y.o. male with extensive past medical history-PAD with a right lower extremity stents, A. fib with RVR status post TEE/DCCV, HTN, polysubstance abuse-tobacco, alcohol, cocaine & THC, chronic thrombocytopenia, NSVT, positive hepatitis C antibody 2013 and abdominal ultrasound suspicious for cirrhosis in 2013, medication noncompliance, presented to the ED with complaints of difficulty breathing. He stated that he was on 12-13 different medications and started having difficulty swallowing and felt that the pills were getting stuck in his throat despite trying to wash them down with liquids. He voluntarily stopped taking all his medications without discussing with his M.D. He however denied difficulty swallowing  food or liquids.  Gives one month history of progressively worsening dyspnea, orthopnea, PND but no leg swelling, fever, chills or productive cough.  His wife confirmed that he has quit drinking alcohol and smoking since March of 2015. He however continues to abuse cocaine and THC-last on 04/16/14. Neither one of them are aware of positive hepatitis C antibody and ultrasound with cirrhosis. In the ED, he was found to be in A. fib with RVR   Hospital Course:  1. A. fib with RVR: resolved -off Cardizem gtt, now on Cardizem CD  -continue coreg, rate controlled -2-D echo with preserved EF, TSH 4.220  -no plans for TEE and cardioversion at this time  -Per Cards, recommended considering Xarelto, however this was stopped in 2013 due to his severe/chronic thrombocytopenia  -In rocket trial for Xarelto excluded pt with plts <90K due to risk of hemorrhage  -he would be a poor coumadin condidate also due to bleeding risk and long h/o non compliance and hence ASA started -Continue ASA 325mg  daily   2. Acute on chronic diastolic CHF  -improving, negative 1.4L,. I/O not recorded yesterday  -creatinine bumped and hence lasix held -resume as outpatient at FU depending on volume status/labs  -2D ECHO with preserved EF   3. Accelerated hypertension: Secondary to noncompliance -improved, conitnue carvedilol, diltiazem   4. Polysubstance abuse-cocaine/THC/tobacco/alcohol: He has quit tobacco and alcohol in March 2015. Counseled cessation from cocaine and THC. He verbalized understanding.  5. Dysphagia: in part psychological -Speech therapy eval completed, regular diet, thin liquids and crushing pills recommended  -he is able to tolerate meds and diet inpatient without any difficulty  6. Cirrhosis noted on Imaging,  likely multifactorial, long term alcoholic, quit in March and Hepatitis C antibody positive -Ultrasound in 2013 suggestive of cirrhosis  -FU hepatitis C antibody and viral load. Outpatient  followup in infectious disease clinic.   7. Chronic thrombocytopenia: Seems to be at baseline. Likely related to liver disease  8. Aki on chronic kidney disease: ' - due to diuresis, cut down lasix, and held at discharge -creatinine improved to 1.6 at discharge  9. Medication noncompliance:  -counseled      Consultations:  Cardiology  Discharge Exam: Filed Vitals:   04/23/14 0523  BP: 137/80  Pulse: 79  Temp: 98.6 F (37 C)  Resp: 19    General: AAOx3 Cardiovascular: S1S2/RRR Respiratory: CTAB  Discharge Instructions You were cared for by a hospitalist during your hospital stay. If you have any questions about your discharge medications or the care you received while you were in the hospital after you are discharged, you can call the unit and asked to speak with the hospitalist on call if the hospitalist that took care of you is not available. Once you are discharged, your primary care physician will handle any further medical issues. Please note that NO REFILLS for any discharge medications will be authorized once you are discharged, as it is imperative that you return to your primary care physician (or establish a relationship with a primary care physician if you do not have one) for your aftercare needs so that they can reassess your need for medications and monitor your lab values.  Discharge Instructions   Diet - low sodium heart healthy    Complete by:  As directed      Increase activity slowly    Complete by:  As directed             Medication List         aspirin 325 MG tablet  Take 1 tablet (325 mg total) by mouth daily.     carvedilol 12.5 MG tablet  Commonly known as:  COREG  Take 1 tablet (12.5 mg total) by mouth 2 (two) times daily with a meal.     diltiazem 240 MG 24 hr capsule  Commonly known as:  CARDIZEM CD  Take 1 capsule (240 mg total) by mouth daily.       No Known Allergies     Follow-up Information   Follow up with HILTY,Kenneth  C, MD. Schedule an appointment as soon as possible for a visit in 1 week.   Specialty:  Cardiology   Contact information:   [address] [place] [address] [phone]       Follow up with OSEI-BONSU,GEORGE, MD. Schedule an appointment as soon as possible for a visit in 1 week.   Specialty:  Internal Medicine   Contact information:   [address] [place] [address] [phone]       Follow up with Acey Lav, MD. Schedule an appointment as soon as possible for a visit in 1 month.   Specialty:  Infectious Diseases   Contact information:   [address] [place] [address] [phone]       Follow up with [facility] In 1 month. (for sleep study)    Specialty:  Pulmonology   Contact information:   [address] [place] [address] [phone]       The results of significant diagnostics from this hospitalization (including imaging, microbiology, ancillary and laboratory) are listed  below for reference.    Significant Diagnostic Studies: Dg Chest Portable 1 View  04/19/2014   CLINICAL DATA:  Shortness of breath.  EXAM: PORTABLE CHEST - 1 VIEW  COMPARISON:  June 23, 2013.  FINDINGS: The heart size and mediastinal contours are within normal limits. Both lungs are clear. No pneumothorax or pleural effusion is noted. The visualized skeletal structures are unremarkable.  IMPRESSION: No acute cardiopulmonary abnormality seen.   Electronically Signed   By: Roque Lias M.D.   On: 04/19/2014 09:22    Microbiology: Recent Results (from the past 240 hour(s))  MRSA PCR SCREENING     Status: None   Collection Time    04/19/14  7:10 PM      Result Value Ref Range Status   MRSA by PCR NEGATIVE  NEGATIVE Final   Comment:            The GeneXpert MRSA Assay (FDA     approved for NASAL specimens     only), is one component of a     comprehensive MRSA colonization     surveillance program. It  is not     intended to diagnose MRSA     infection nor to guide or     monitor treatment for     MRSA infections.     Labs: Basic Metabolic Panel:  Recent Labs Lab 04/19/14 0904 04/21/14 0450 04/21/14 0853 04/22/14 0625 04/23/14 0325  NA 139 136* 139 137 138  K 3.6* 3.9 4.1 4.3 4.3  CL 102 101 101 101 103  CO2 21 23 25 21 22   GLUCOSE 115* 120* 133* 118* 106*  BUN 19 29* 27* 32* 30*  CREATININE 1.37* 2.04* 2.04* 2.12* 1.62*  CALCIUM 8.9 8.5 8.6 8.4 8.0*  MG 1.8  --   --   --   --    Liver Function Tests:  Recent Labs Lab 04/19/14 0904  AST 29  ALT 26  ALKPHOS 87  BILITOT 1.3*  PROT 7.0  ALBUMIN 3.7   No results found for this basename: LIPASE, AMYLASE,  in the last 168 hours No results found for this basename: AMMONIA,  in the last 168 hours CBC:  Recent Labs Lab 04/19/14 0904 04/20/14 0831 04/21/14 0450  WBC 9.2 7.9 9.1  NEUTROABS 7.6  --   --   HGB 13.9 13.6 13.0  HCT 41.1 40.7 39.4  MCV 99.3 100.2* 100.0  PLT 62* 59* 57*   Cardiac Enzymes:  Recent Labs Lab 04/19/14 0904 04/19/14 1910  TROPONINI <0.30 <0.30   BNP: BNP (last 3 results)  Recent Labs  04/19/14 0904  PROBNP 4629.0*   CBG:  Recent Labs Lab 04/20/14 2036  GLUCAP 117*       Signed:  Shakeena Kafer  [facility] 04/23/2014, 11:09 AM

## 2014-04-25 NOTE — ED Provider Notes (Signed)
Medical screening examination/treatment/procedure(s) were conducted as a shared visit with non-physician practitioner(s) and myself.  I personally evaluated the patient during the encounter.   EKG Interpretation   Date/Time:  Tuesday April 19 2014 08:41:35 EDT Ventricular Rate:  144 PR Interval:    QRS Duration: 88 QT Interval:  330 QTC Calculation: 510 R Axis:   -109 Text Interpretation:  Atrial fibrillation with rapid ventricular response  Right superior axis deviation Pulmonary disease pattern RSR' or QR pattern  in V1 suggests right ventricular conduction delay ST \\T \ T wave  abnormality, consider inferior ischemia or digitalis effect Abnormal ECG  ED PHYSICIAN INTERPRETATION AVAILABLE IN [facility] Confirmed by  TEST, Record ([id number]) on 04/21/2014 7:45:14 AM      Admit for CHF exacerbation. BNP 4630    Lyanne Co, MD 04/25/14 2314

## 2014-04-28 ENCOUNTER — Telehealth: Payer: Self-pay | Admitting: Internal Medicine

## 2014-04-28 NOTE — Telephone Encounter (Signed)
Closed encounter °

## 2014-05-19 ENCOUNTER — Ambulatory Visit: Payer: BC Managed Care – PPO | Admitting: Cardiology

## 2014-06-21 ENCOUNTER — Ambulatory Visit: Payer: BC Managed Care – PPO | Admitting: Cardiology

## 2014-07-06 ENCOUNTER — Ambulatory Visit: Payer: BC Managed Care – PPO | Admitting: Cardiology

## 2014-08-04 ENCOUNTER — Ambulatory Visit: Payer: BC Managed Care – PPO | Admitting: Cardiology

## 2014-08-23 ENCOUNTER — Other Ambulatory Visit: Payer: Self-pay | Admitting: *Deleted

## 2014-08-23 ENCOUNTER — Ambulatory Visit (INDEPENDENT_AMBULATORY_CARE_PROVIDER_SITE_OTHER): Payer: BC Managed Care – PPO | Admitting: Cardiology

## 2014-08-23 ENCOUNTER — Encounter: Payer: Self-pay | Admitting: Cardiology

## 2014-08-23 VITALS — BP 140/90 | HR 90 | Ht 71.0 in | Wt 226.6 lb

## 2014-08-23 DIAGNOSIS — I5033 Acute on chronic diastolic (congestive) heart failure: Secondary | ICD-10-CM

## 2014-08-23 DIAGNOSIS — R768 Other specified abnormal immunological findings in serum: Secondary | ICD-10-CM

## 2014-08-23 DIAGNOSIS — I5032 Chronic diastolic (congestive) heart failure: Secondary | ICD-10-CM

## 2014-08-23 DIAGNOSIS — F1910 Other psychoactive substance abuse, uncomplicated: Secondary | ICD-10-CM | POA: Insufficient documentation

## 2014-08-23 DIAGNOSIS — R0789 Other chest pain: Secondary | ICD-10-CM

## 2014-08-23 DIAGNOSIS — K7031 Alcoholic cirrhosis of liver with ascites: Secondary | ICD-10-CM

## 2014-08-23 DIAGNOSIS — D696 Thrombocytopenia, unspecified: Secondary | ICD-10-CM

## 2014-08-23 DIAGNOSIS — N529 Male erectile dysfunction, unspecified: Secondary | ICD-10-CM

## 2014-08-23 DIAGNOSIS — I150 Renovascular hypertension: Secondary | ICD-10-CM

## 2014-08-23 DIAGNOSIS — I4819 Other persistent atrial fibrillation: Secondary | ICD-10-CM

## 2014-08-23 DIAGNOSIS — I481 Persistent atrial fibrillation: Secondary | ICD-10-CM

## 2014-08-23 DIAGNOSIS — R894 Abnormal immunological findings in specimens from other organs, systems and tissues: Secondary | ICD-10-CM

## 2014-08-23 HISTORY — DX: Other persistent atrial fibrillation: I48.19

## 2014-08-23 MED ORDER — DILTIAZEM HCL ER COATED BEADS 240 MG PO CP24: 240.0000 mg | ORAL_CAPSULE | Freq: Every day | ORAL | Status: DC

## 2014-08-23 MED ORDER — CARVEDILOL 12.5 MG PO TABS: 12.5000 mg | ORAL_TABLET | Freq: Two times a day (BID) | ORAL | Status: DC

## 2014-08-23 NOTE — Assessment & Plan Note (Signed)
Followed by PCP

## 2014-08-23 NOTE — Assessment & Plan Note (Signed)
euvolemic today, will not add diuretic.

## 2014-08-23 NOTE — Progress Notes (Signed)
08/23/2014   PCP: Jackie Plum, MD   Chief Complaint  Patient presents with  . Hospitalization Follow-up    C/o a little chest pain-thought it was heart burn, has "little pains" that start in left chest and radiate down left arm, weakness in legs    Primary Cardiologist:Dr. Ellene Route   HPI:  63 y.o. male with extensive past medical history-PAD with a right lower extremity stents, A. fib with RVR status post TEE/DCCV, HTN, polysubstance abuse-tobacco, alcohol, cocaine & THC, chronic thrombocytopenia, NSVT, positive hepatitis C antibody 2013 and abdominal ultrasound suspicious for cirrhosis in 2013, medication noncompliance, is here today s/p hospitalization in July for SOB. He had not been taking his meds.  Some due to having difficulty swallowing.  He had acute renal insuff and lasix held.  His AFIB was rapid and treated.  Not a good coumadin candidate due to non-compliance, poly-substance abuse. Agree that ASA 325 mg po Qdaily may be best option right now- though now he is on 81 mg.   He has not been on lasix since July and is euvolemic at this time.  Will not add.  His PCP is following labs.    He has trouble with finances and has held off on this appt. For some time.  He is out of some of his meds.  We will refill his Cardizem and coreg.  His BP is up due to this.  His HR is controlled.    He complains of chest pain 3-4 times in last month, no associated symptoms and it may radiate to his Lt arm.  Last stress test 2008.    He is asking if he can have sex with his wife, though it seems he is having ED.  He will discuss with his PCP.    He continues to smoke cigarettes on occ.;  occ TSH, and 1-2 beers a day.  He denies any cocaine.   We discussed importance of not using substances that are toxic to his heart.    Last echo 03/2014 with EF 55-60%.  Mild LVH. Mod TR, mild MR.  He denies any exercise. He also complains of claudication.  He said PCP was following.  No Known  Allergies  Current Outpatient Prescriptions  Medication Sig Dispense Refill  . aspirin EC 81 MG tablet Take 81 mg by mouth daily.    . CVS B-1 100 MG tablet Take 100 mg by mouth daily.  1  . folic acid (FOLVITE) 1 MG tablet Take 1 mg by mouth daily.  1  . carvedilol (COREG) 12.5 MG tablet Take 1 tablet (12.5 mg total) by mouth 2 (two) times daily with a meal. 60 tablet 6  . diltiazem (CARDIZEM CD) 240 MG 24 hr capsule Take 1 capsule (240 mg total) by mouth daily. 30 capsule 6   No current facility-administered medications for this visit.    Past Medical History  Diagnosis Date  . PAD (peripheral artery disease)   . Hypertension   . Cocaine abuse   . ETOH abuse   . Tobacco abuse   . Cholelithiases 05/01/2012  . Dysrhythmia     afib  . CHF (congestive heart failure)   . Blood transfusion without reported diagnosis   . Hepatitis C antibody test positive 04/2012  . Claudication     bilateral  . Hyperpigmentation   . Thrombocytopenia   . Persistent atrial fibrillation 08/23/2014    Past Surgical History  Procedure Laterality Date  .  Endovascular stent insertion  right leg  . Tee without cardioversion  05/05/2012    Procedure: TRANSESOPHAGEAL ECHOCARDIOGRAM (TEE);  Surgeon: Thurmon FairMihai Croitoru, MD;  [facility];  Service: Cardiovascular;  Laterality: N/A;  . Cardioversion  05/05/2012    Procedure: CARDIOVERSION;  Surgeon: Thurmon FairMihai Croitoru, MD;  Location: [facility] ENDOSCOPY;  Service: Cardiovascular;  Laterality: N/A;  . Cardiolite      negative for ischemia  . Carotid artery angioplasty    . Doppler echocardiography    . Cardiovascular stress test      ZOX:WRUEAVW:UJROS:General:no colds or fevers, + weight increase Skin:no rashes or ulcers HEENT:no blurred vision, no congestion CV:see HPI PUL:see HPI GI:no diarrhea constipation or melena, no indigestion GU:no hematuria, no dysuria MS:no joint pain, no claudication Neuro:no syncope, no lightheadedness Endo:no diabetes, no thyroid  disease  Wt Readings from Last 3 Encounters:  08/23/14 226 lb 9.6 oz (102.785 kg)  04/23/14 215 lb 13.3 oz (97.9 kg)  06/23/13 220 lb (99.791 kg)    PHYSICAL EXAM BP 140/90 mmHg  Pulse 90  Ht 5\' 11"  (1.803 m)  Wt 226 lb 9.6 oz (102.785 kg)  BMI 31.62 kg/m2 General:Pleasant affect, NAD Skin:Warm and dry, brisk capillary refill HEENT:normocephalic, sclera clear, mucus membranes moist Neck:supple, no JVD, no bruits  Heart:irreg irrreg without murmur, gallup, rub or click Lungs:clear without rales, rhonchi, or wheezes WJX:BJYNAbd:soft, non tender, + BS, do not palpate liver spleen or masses Ext:no lower ext edema, 2+ radial pulses Neuro:alert and oriented X 3, MAE, follows commands, + facial symmetry  EKG: A fib with controlled rate of 90, +LVH, LAD, no acute changes.  ASSESSMENT AND PLAN Persistent atrial fibrillation Continues in afib with rate control  In the 90s.  Continue current meds and ASA.  Has not been on anticoagulation due to noncompliance and thrombocytopenia.  Refilled his coreg and his cardizem.  Chest pain, no significant macrovascular CAD at cath 05/01/12; Likely due to elevated LVEDP & Afib with Diastolic HF. Now with rate control has chest pain over the last month.  He has also been missing some of his meds.  Will do exercise myoview. He will follow up with Dr. Ellene RouteK. Hilty in 6-8 weeks unless test positive.   Diastolic CHF, acute on chronic euvolemic today, will not add diuretic.    HTN (hypertension) -Accelerated with End organ involvement bp elevated today, but has run out of some of his meds.  Reordered his coreg and dilt.  Encouraged to take.  Substance abuse Has stopped cocaine, continues with alcohol and TSH.  Reminded to decrease alcohol with his liver issues and congratulated on stopping cocaine.   Thrombocytopenia on admission 04/30/12, thought to be secondary to Xarelto, Platelets no better 2015 Not on anticoagulation due to his thrombocytopenia.  Platelets in  August are 57.  Hepatitis C antibody test positive Followed by PCP  Erectile dysfunction This could be due to DM or medications. He will discuss with PCP.

## 2014-08-23 NOTE — Assessment & Plan Note (Signed)
This could be due to DM or medications. He will discuss with PCP.

## 2014-08-23 NOTE — Assessment & Plan Note (Signed)
Not on anticoagulation due to his thrombocytopenia.  Platelets in August are 57.

## 2014-08-23 NOTE — Patient Instructions (Signed)
eour physician recommends that you schedule a follow-up appointment in:  6 to 8 weeks with Dr. Rennis Golden  We are ordering a stress test for you to get done

## 2014-08-23 NOTE — Assessment & Plan Note (Deleted)
This could be related due to DM and or medications.  He will discuss with PCP.

## 2014-08-23 NOTE — Assessment & Plan Note (Signed)
bp elevated today, but has run out of some of his meds.  Reordered his coreg and dilt.  Encouraged to take.

## 2014-08-23 NOTE — Assessment & Plan Note (Signed)
Continues in afib with rate control  In the 90s.  Continue current meds and ASA.  Has not been on anticoagulation due to noncompliance and thrombocytopenia.  Refilled his coreg and his cardizem.

## 2014-08-23 NOTE — Assessment & Plan Note (Signed)
Now with rate control has chest pain over the last month.  He has also been missing some of his meds.  Will do exercise myoview. He will follow up with Dr. Ellene Route in 6-8 weeks unless test positive.

## 2014-08-23 NOTE — Assessment & Plan Note (Signed)
Has stopped cocaine, continues with alcohol and TSH.  Reminded to decrease alcohol with his liver issues and congratulated on stopping cocaine.

## 2014-08-24 ENCOUNTER — Telehealth (HOSPITAL_COMMUNITY): Payer: Self-pay

## 2014-08-24 NOTE — Telephone Encounter (Signed)
Encounter complete. 

## 2014-08-26 ENCOUNTER — Other Ambulatory Visit: Payer: Self-pay | Admitting: *Deleted

## 2014-08-26 ENCOUNTER — Ambulatory Visit (HOSPITAL_COMMUNITY)
Admission: RE | Admit: 2014-08-26 | Discharge: 2014-08-26 | Disposition: A | Discharge status: Home / Self Care | Payer: BC Managed Care – PPO | Source: Ambulatory Visit | Attending: Internal Medicine | Admitting: Internal Medicine

## 2014-08-26 DIAGNOSIS — R079 Chest pain, unspecified: Secondary | ICD-10-CM | POA: Insufficient documentation

## 2014-08-26 DIAGNOSIS — Z720 Tobacco use: Secondary | ICD-10-CM | POA: Insufficient documentation

## 2014-08-26 DIAGNOSIS — I739 Peripheral vascular disease, unspecified: Secondary | ICD-10-CM | POA: Diagnosis not present

## 2014-08-26 DIAGNOSIS — R9431 Abnormal electrocardiogram [ECG] [EKG]: Secondary | ICD-10-CM | POA: Insufficient documentation

## 2014-08-26 DIAGNOSIS — R002 Palpitations: Secondary | ICD-10-CM | POA: Diagnosis not present

## 2014-08-26 DIAGNOSIS — R0609 Other forms of dyspnea: Secondary | ICD-10-CM | POA: Diagnosis not present

## 2014-08-26 DIAGNOSIS — E669 Obesity, unspecified: Secondary | ICD-10-CM | POA: Insufficient documentation

## 2014-08-26 DIAGNOSIS — I10 Essential (primary) hypertension: Secondary | ICD-10-CM | POA: Diagnosis not present

## 2014-08-26 DIAGNOSIS — Z01818 Encounter for other preprocedural examination: Secondary | ICD-10-CM

## 2014-08-26 DIAGNOSIS — R0789 Other chest pain: Secondary | ICD-10-CM

## 2014-08-26 DIAGNOSIS — R5383 Other fatigue: Secondary | ICD-10-CM | POA: Insufficient documentation

## 2014-08-26 MED ORDER — TECHNETIUM TC 99M SESTAMIBI GENERIC - CARDIOLITE
10.6000 | Freq: Once | INTRAVENOUS | Status: AC | PRN
  Administered 2014-08-26: 11 via INTRAVENOUS

## 2014-08-26 MED ORDER — REGADENOSON 0.4 MG/5ML IV SOLN
0.4000 mg | Freq: Once | INTRAVENOUS | Status: AC
Start: 2014-08-26 — End: 2014-08-26
  Administered 2014-08-26: 0.4 mg via INTRAVENOUS

## 2014-08-26 MED ORDER — TECHNETIUM TC 99M SESTAMIBI GENERIC - CARDIOLITE
30.9000 | Freq: Once | INTRAVENOUS | Status: AC | PRN
  Administered 2014-08-26: 30.9 via INTRAVENOUS

## 2014-08-26 NOTE — Progress Notes (Signed)
Cath ordered and hospital orders placed. Patient aware to arrive at Main Entrance of [facility] between 930-10am for a 12pm LHC with Dr. Rennis Golden. Instructed to take morning meds with small sip of water, otherwise NPO after midnight. Patient instructed to pack overnight bag in event he should need to stay in hospital and that he needs a responsible adult to drive him to and from hospital. Patient voiced understanding of instructions. He is aware he will have lab work on Monday at the hospital.

## 2014-08-26 NOTE — Procedures (Addendum)
[facility] [address] [place] [address] [phone]  Cardiology Nuclear Med Study  Kevin [name] is a 62 y.o. male     MRN : [id number]     DOB: 1951-08-13  Procedure Date: 08/26/2014  Nuclear Med Background Indication for Stress Test:  Evaluation for Ischemia, Post Hospital and Abnormal EKG History:  AFIB w/RVR;NSVT;s/p TEE/DCCV;CHF;HX OF TOBACCO, ALCOHOL AND COCAINE ABUSE;Chronic Thrombocyopenia;Last NUC MPI on 08/17/2007-nonischemic;EF=54% Cardiac Risk Factors: Claudication, Hypertension, Obesity, PVD and Smoker  Symptoms:  Chest Pain, DOE, Fatigue, Palpitations and SOB   Nuclear Pre-Procedure Caffeine/Decaff Intake:  7:00pm NPO After: 5:00am   IV Site: R Forearm  IV 0.9% NS with Angio Cath:  22g  Chest Size (in):  48"  IV Started by: Berdie Ogren, RN  Height: 5\' 11"  (1.803 m)  Cup Size: n/a  BMI:  Body mass index is 31.53 kg/(m^2). Weight:  226 lb (102.513 kg)   Tech Comments:  Pt. Wasn't walked d/t HTN; changed to [employer] Med Study 1 or 2 day study: 1 day  Stress Test Type:  Lexiscan  Order Authorizing Provider:  Zoila Shutter, MD   Resting Radionuclide: Technetium 64m Sestamibi  Resting Radionuclide Dose: 10.6 mCi   Stress Radionuclide:  Technetium 15m Sestamibi  Stress Radionuclide Dose: 30.9 mCi           Stress Protocol Rest HR: 103 Stress HR: 109  Rest BP: 157/111 Stress BP: 170/110  Exercise Time (min): n/a METS: n/a   Predicted Max HR: 158 bpm % Max HR: 89.24 bpm Rate Pressure Product: 16010  Dose of Adenosine (mg):  n/a Dose of Lexiscan: 0.4 mg  Dose of Atropine (mg): n/a Dose of Dobutamine: n/a mcg/kg/min (at max HR)  Stress Test Technologist: Esperanza Sheets, CCT Nuclear Technologist: Gonzella Lex, CNMT   Rest Procedure:  Myocardial perfusion imaging was performed at rest 45 minutes following the intravenous administration of Technetium 32m Sestamibi. Stress Procedure:   The patient received IV Lexiscan 0.4 mg over 15-seconds.  Technetium 40m Sestamibi injected Iv at 30-seconds.  There were no significant changes with Lexiscan.  Quantitative spect images were obtained after a 45 minute delay.  Transient Ischemic Dilatation (Normal <1.22):  1.28  QGS EDV:  n/a ml QGS ESV:  n/a ml LV Ejection Fraction: Study not gated     Rest ECG: NSR-LVH and Atrial Fibrilliation  Stress ECG: No significant change from baseline ECG  QPS Raw Data Images:  Normal; no motion artifact; normal heart/lung ratio. Stress Images:  There is decreased uptake in the lateral wall. Rest Images:  Normal homogeneous uptake in all areas of the myocardium. Subtraction (SDS):  These findings are consistent with ischemia.  Impression Exercise Capacity:  Lexiscan with no exercise. BP Response:  Normal blood pressure response. Clinical Symptoms:  No significant symptoms noted. ECG Impression:  Significant baseline STTWC prob secondary to LVH with repol Comparison with Prior Nuclear Study: No images to compare  Overall Impression:  Intermediate risk stress nuclear study Moderate area of Anterolateral/lateral wall ischemia. Follow up with Dr. Rennis Golden.  LV Wall Motion:  Not gated secondary to Afib   Runell Gess, MD  08/26/2014 12:15 PM

## 2014-08-29 ENCOUNTER — Other Ambulatory Visit: Payer: Self-pay | Admitting: *Deleted

## 2014-08-29 ENCOUNTER — Encounter: Payer: Self-pay | Admitting: Internal Medicine

## 2014-08-29 ENCOUNTER — Encounter (HOSPITAL_COMMUNITY): Admission: RE | Payer: Self-pay | Source: Ambulatory Visit

## 2014-08-29 ENCOUNTER — Ambulatory Visit (HOSPITAL_COMMUNITY)
Admission: RE | Admit: 2014-08-29 | Payer: BC Managed Care – PPO | Source: Ambulatory Visit | Admitting: Internal Medicine

## 2014-08-29 ENCOUNTER — Telehealth: Payer: Self-pay | Admitting: *Deleted

## 2014-08-29 DIAGNOSIS — D689 Coagulation defect, unspecified: Secondary | ICD-10-CM

## 2014-08-29 DIAGNOSIS — Z79899 Other long term (current) drug therapy: Secondary | ICD-10-CM

## 2014-08-29 DIAGNOSIS — R5383 Other fatigue: Secondary | ICD-10-CM

## 2014-08-29 DIAGNOSIS — Z01812 Encounter for preprocedural laboratory examination: Secondary | ICD-10-CM

## 2014-08-29 SURGERY — LEFT HEART CATHETERIZATION WITH CORONARY ANGIOGRAM
Anesthesia: LOCAL

## 2014-08-29 NOTE — Telephone Encounter (Signed)
Per scheduler, patient had called in this AM requesting his LHC be rescheduled until after the holidays, from his scheduled LHC today at noon. RN called patient to discuss this patient's desire to change his procedure date. Patient stated he would not be able to make it to his procedure today and wanted to wait until after the holidays to do the LHC. RN explained that the reason the LHC was ordered was r/t the abnormalities noted on the stress test, indicative of possible ischemia or lack of blood flow to parts of his heart and that his symptoms of chest discomfort prompted the provider to order the stress test, which could have been the physical manifestations that something was going on. Patient states he feels fine now since he is taking his medications as prescribed. The importance of having the LHC done, despite him being asymptomatic, was stressed to the patient. He was advised to go to ED should he develop new or worsening CP or SOB. He voiced understanding of this. He was notified that the schedulers would reach out to him to reschedule his LHC. The MD who was to perform the LHC was notified of this cancellation as well.

## 2014-09-01 ENCOUNTER — Encounter (HOSPITAL_COMMUNITY): Payer: Self-pay | Admitting: Cardiology

## 2014-09-02 ENCOUNTER — Other Ambulatory Visit: Payer: Self-pay

## 2014-09-02 DIAGNOSIS — R9439 Abnormal result of other cardiovascular function study: Secondary | ICD-10-CM

## 2014-09-02 NOTE — Progress Notes (Signed)
Cath orders placed for procedure.

## 2014-09-27 ENCOUNTER — Encounter (HOSPITAL_COMMUNITY)
Admission: RE | Disposition: A | Discharge status: Home / Self Care | Payer: Self-pay | Source: Ambulatory Visit | Attending: Internal Medicine

## 2014-09-27 ENCOUNTER — Ambulatory Visit (HOSPITAL_COMMUNITY)
Admission: RE | Admit: 2014-09-27 | Discharge: 2014-09-27 | Disposition: A | Discharge status: Home / Self Care | Payer: BLUE CROSS/BLUE SHIELD | Source: Ambulatory Visit | Attending: Internal Medicine | Admitting: Internal Medicine

## 2014-09-27 ENCOUNTER — Encounter (HOSPITAL_COMMUNITY): Payer: Self-pay | Admitting: Internal Medicine

## 2014-09-27 DIAGNOSIS — I5032 Chronic diastolic (congestive) heart failure: Secondary | ICD-10-CM | POA: Diagnosis present

## 2014-09-27 DIAGNOSIS — F1410 Cocaine abuse, uncomplicated: Secondary | ICD-10-CM | POA: Insufficient documentation

## 2014-09-27 DIAGNOSIS — I4891 Unspecified atrial fibrillation: Secondary | ICD-10-CM | POA: Diagnosis not present

## 2014-09-27 DIAGNOSIS — F17210 Nicotine dependence, cigarettes, uncomplicated: Secondary | ICD-10-CM | POA: Insufficient documentation

## 2014-09-27 DIAGNOSIS — I10 Essential (primary) hypertension: Secondary | ICD-10-CM | POA: Insufficient documentation

## 2014-09-27 DIAGNOSIS — I739 Peripheral vascular disease, unspecified: Secondary | ICD-10-CM | POA: Diagnosis present

## 2014-09-27 DIAGNOSIS — D696 Thrombocytopenia, unspecified: Secondary | ICD-10-CM | POA: Diagnosis not present

## 2014-09-27 DIAGNOSIS — I4510 Unspecified right bundle-branch block: Secondary | ICD-10-CM | POA: Insufficient documentation

## 2014-09-27 DIAGNOSIS — I25110 Atherosclerotic heart disease of native coronary artery with unstable angina pectoris: Secondary | ICD-10-CM

## 2014-09-27 DIAGNOSIS — Z9114 Patient's other noncompliance with medication regimen: Secondary | ICD-10-CM

## 2014-09-27 DIAGNOSIS — F1010 Alcohol abuse, uncomplicated: Secondary | ICD-10-CM | POA: Diagnosis present

## 2014-09-27 DIAGNOSIS — I200 Unstable angina: Secondary | ICD-10-CM | POA: Diagnosis present

## 2014-09-27 DIAGNOSIS — R768 Other specified abnormal immunological findings in serum: Secondary | ICD-10-CM | POA: Diagnosis present

## 2014-09-27 DIAGNOSIS — R9439 Abnormal result of other cardiovascular function study: Secondary | ICD-10-CM | POA: Diagnosis present

## 2014-09-27 DIAGNOSIS — B1920 Unspecified viral hepatitis C without hepatic coma: Secondary | ICD-10-CM | POA: Insufficient documentation

## 2014-09-27 HISTORY — PX: LEFT HEART CATHETERIZATION WITH CORONARY ANGIOGRAM: SHX5451

## 2014-09-27 LAB — CBC
HCT: 42 % (ref 39.0–52.0)
Hemoglobin: 14.4 g/dL (ref 13.0–17.0)
MCH: 34 pg (ref 26.0–34.0)
MCHC: 34.3 g/dL (ref 30.0–36.0)
MCV: 99.3 fL (ref 78.0–100.0)
Platelets: 61 10*3/uL — ABNORMAL LOW (ref 150–400)
RBC: 4.23 MIL/uL (ref 4.22–5.81)
RDW: 12.4 % (ref 11.5–15.5)
WBC: 5.7 10*3/uL (ref 4.0–10.5)

## 2014-09-27 LAB — PROTIME-INR
INR: 1.04 (ref 0.00–1.49)
Prothrombin Time: 13.7 seconds (ref 11.6–15.2)

## 2014-09-27 LAB — BASIC METABOLIC PANEL
Anion gap: 9 (ref 5–15)
BUN: 18 mg/dL (ref 6–23)
CO2: 23 mmol/L (ref 19–32)
Calcium: 8.4 mg/dL (ref 8.4–10.5)
Chloride: 105 mEq/L (ref 96–112)
Creatinine, Ser: 1.4 mg/dL — ABNORMAL HIGH (ref 0.50–1.35)
GFR calc Af Amer: 61 mL/min — ABNORMAL LOW (ref >90)
GFR calc non Af Amer: 52 mL/min — ABNORMAL LOW (ref >90)
Glucose, Bld: 120 mg/dL — ABNORMAL HIGH (ref 70–99)
Potassium: 3.6 mmol/L (ref 3.5–5.1)
Sodium: 137 mmol/L (ref 135–145)

## 2014-09-27 SURGERY — LEFT HEART CATHETERIZATION WITH CORONARY ANGIOGRAM
Anesthesia: LOCAL

## 2014-09-27 MED ORDER — VERAPAMIL HCL 2.5 MG/ML IV SOLN
INTRAVENOUS | Status: AC
  Filled 2014-09-27: qty 2

## 2014-09-27 MED ORDER — MIDAZOLAM HCL 2 MG/2ML IJ SOLN
INTRAMUSCULAR | Status: AC
  Filled 2014-09-27: qty 2

## 2014-09-27 MED ORDER — SODIUM CHLORIDE 0.9 % IV SOLN
1.0000 mL/kg/h | INTRAVENOUS | Status: AC
Start: 2014-09-27 — End: 2014-09-27
  Administered 2014-09-27: 1 mL/kg/h via INTRAVENOUS

## 2014-09-27 MED ORDER — ASPIRIN 81 MG PO CHEW
CHEWABLE_TABLET | ORAL | Status: AC
  Filled 2014-09-27: qty 1

## 2014-09-27 MED ORDER — SODIUM CHLORIDE 0.9 % IV SOLN
INTRAVENOUS | Status: DC
  Administered 2014-09-27: 1000 mL via INTRAVENOUS

## 2014-09-27 MED ORDER — LIDOCAINE HCL (PF) 1 % IJ SOLN
INTRAMUSCULAR | Status: AC
  Filled 2014-09-27: qty 30

## 2014-09-27 MED ORDER — ISOSORBIDE MONONITRATE ER 30 MG PO TB24: 30.0000 mg | ORAL_TABLET | Freq: Every day | ORAL | Status: DC

## 2014-09-27 MED ORDER — SODIUM CHLORIDE 0.9 % IJ SOLN: 3.0000 mL | INTRAMUSCULAR | Status: DC | PRN

## 2014-09-27 MED ORDER — HEPARIN (PORCINE) IN NACL 2-0.9 UNIT/ML-% IJ SOLN
INTRAMUSCULAR | Status: AC
  Filled 2014-09-27: qty 1500

## 2014-09-27 MED ORDER — HYDRALAZINE HCL 20 MG/ML IJ SOLN
INTRAMUSCULAR | Status: AC
  Filled 2014-09-27: qty 1

## 2014-09-27 MED ORDER — ASPIRIN 81 MG PO CHEW
81.0000 mg | CHEWABLE_TABLET | ORAL | Status: AC
Start: 2014-09-28 — End: 2014-09-27
  Administered 2014-09-27: 81 mg via ORAL

## 2014-09-27 MED ORDER — FENTANYL CITRATE 0.05 MG/ML IJ SOLN
INTRAMUSCULAR | Status: AC
  Filled 2014-09-27: qty 2

## 2014-09-27 MED ORDER — NITROGLYCERIN 1 MG/10 ML FOR IR/CATH LAB
INTRA_ARTERIAL | Status: AC
  Filled 2014-09-27: qty 10

## 2014-09-27 NOTE — Discharge Instructions (Signed)
Coronary Angiogram °A coronary angiogram, also called coronary angiography, is an X-ray procedure used to look at the arteries in the heart. In this procedure, a dye (contrast dye) is injected through a long, hollow tube (catheter). The catheter is about the size of a piece of cooked spaghetti and is inserted through your groin, wrist, or arm. The dye is injected into each artery, and X-rays are then taken to show if there is a blockage in the arteries of your heart. °LET YOUR HEALTH CARE PROVIDER KNOW ABOUT: °· Any allergies you have, including allergies to shellfish or contrast dye.   °· All medicines you are taking, including vitamins, herbs, eye drops, creams, and over-the-counter medicines.   °· Previous problems you or members of your family have had with the use of anesthetics.   °· Any blood disorders you have.   °· Previous surgeries you have had. °· History of kidney problems or failure.   °· Other medical conditions you have. °RISKS AND COMPLICATIONS  °Generally, a coronary angiogram is a safe procedure. However, problems can occur and include: °· Allergic reaction to the dye. °· Bleeding from the access site or other locations. °· Kidney injury, especially in people with impaired kidney function.  °· Stroke (rare). °· Heart attack (rare). °BEFORE THE PROCEDURE  °· Do not eat or drink anything after midnight the night before the procedure or as directed by your health care provider.   °· Ask your health care provider about changing or stopping your regular medicines. This is especially important if you are taking diabetes medicines or blood thinners. °PROCEDURE °· You may be given a medicine to help you relax (sedative) before the procedure. This medicine is given through an intravenous (IV) access tube that is inserted into one of your veins.   °· The area where the catheter will be inserted will be washed and shaved. This is usually done in the groin but may be done in the fold of your arm (near your  elbow) or in the wrist.    °· A medicine will be given to numb the area where the catheter will be inserted (local anesthetic).   °· The health care provider will insert the catheter into an artery. The catheter will be guided by using a special type of X-ray (fluoroscopy) of the blood vessel being examined.   °· A special dye will then be injected into the catheter, and X-rays will be taken. The dye will help to show where any narrowing or blockages are located in the heart arteries.   °AFTER THE PROCEDURE  °· If the procedure is done through the leg, you will be kept in bed lying flat for several hours. You will be instructed to not bend or cross your legs. °· The insertion site will be checked frequently.   °· The pulse in your feet or wrist will be checked frequently.   °· Additional blood tests, X-rays, and an electrocardiogram may be done.   °Document Released: 03/16/2003 Document Revised: 01/24/2014 Document Reviewed: 02/01/2013 °[employer]® Patient Information ©2015 [employer]. This information is not intended to replace advice given to you by your health care provider. Make sure you discuss any questions you have with your health care provider. °Radial Site Care °Refer to this sheet in the next few weeks. These instructions provide you with information on caring for yourself after your procedure. Your caregiver may also give you more specific instructions. Your treatment has been planned according to current medical practices, but problems sometimes occur. Call your caregiver if you have any problems or questions after your procedure. °  HOME CARE INSTRUCTIONS  You may shower the day after the procedure.Remove the bandage (dressing) and gently wash the site with plain soap and water.Gently pat the site dry.  Do not apply powder or lotion to the site.  Do not submerge the affected site in water for 3 to 5 days.  Inspect the site at least twice daily.  Do not flex or bend the affected arm for 24  hours.  No lifting over 5 pounds (2.3 kg) for 5 days after your procedure.  Do not drive home if you are discharged the same day of the procedure. Have someone else drive you.  You may drive 24 hours after the procedure unless otherwise instructed by your caregiver.  Do not operate machinery or power tools for 24 hours.  A responsible adult should be with you for the first 24 hours after you arrive home. What to expect:  Any bruising will usually fade within 1 to 2 weeks.  Blood that collects in the tissue (hematoma) may be painful to the touch. It should usually decrease in size and tenderness within 1 to 2 weeks. SEEK IMMEDIATE MEDICAL CARE IF:  You have unusual pain at the radial site.  You have redness, warmth, swelling, or pain at the radial site.  You have drainage (other than a small amount of blood on the dressing).  You have chills.  You have a fever or persistent symptoms for more than 72 hours.  You have a fever and your symptoms suddenly get worse.  Your arm becomes pale, cool, tingly, or numb.  You have heavy bleeding from the site. Hold pressure on the site. Document Released: 10/12/2010 Document Revised: 12/02/2011 Document Reviewed: 10/12/2010 [facility] Patient Information 2015 [place], [place]. This information is not intended to replace advice given to you by your health care provider. Make sure you discuss any questions you have with your health care provider.

## 2014-09-27 NOTE — CV Procedure (Signed)
CARDIAC CATHETERIZATION REPORT  Kevin [name]   [id number] 1951-04-27  Performing Cardiologist: Chrystie Nose Primary Physician: Jackie Plum, MD Primary Cardiologist:  Dr. Rennis Golden  Procedures Performed:  Left Heart Catheterization via 5 Fr right radial artery access  Native Coronary Angiography  Indication(s): chest pain  Pre-Procedural Diagnosis(es):  1. Unstable angina 2. Intermediate risk stress test with lateral ischemia  Post-Procedural Diagnosis(es): 1. 90% mid-ramus branch stenosis 2. 70% ostial PDA/PLB bifurcation stenosis  Pre-Procedural Non-invasive testing: Abnormal -- see above.  History: 64 y.o. male presented with extensive past medical history-PAD with a right lower extremity stents, A. fib with RVR status post TEE/DCCV, HTN, polysubstance abuse-tobacco, alcohol, cocaine & THC, chronic thrombocytopenia, NSVT, positive hepatitis C antibody 2013 and abdominal ultrasound suspicious for cirrhosis in 2013, medication noncompliance, is here today s/p hospitalization in July for SOB. He had not been taking his meds. Some due to having difficulty swallowing. He had acute renal insuff and lasix held. His AFIB was rapid and treated. Not a good coumadin candidate due to non-compliance, poly-substance abuse. Agree that ASA 325 mg po Qdaily may be best option right now- though now he is on 81 mg. He has not been on lasix since July and is euvolemic at this time. Will not add. His PCP is following labs.   He has trouble with finances and has held off on this appt. For some time. He is out of some of his meds. We will refill his Cardizem and coreg. His BP is up due to this. His HR is controlled.   He complains of chest pain 3-4 times in last month, no associated symptoms and it may radiate to his Lt arm. Last stress test 2008.   He is asking if he can have sex with his wife, though it seems he is having ED. He will discuss with his PCP.   He  continues to smoke cigarettes on occ.; occ TSH, and 1-2 beers a day. He denies any cocaine. We discussed importance of not using substances that are toxic to his heart.   Last echo 03/2014 with EF 55-60%. Mild LVH. Mod TR, mild MR. He denies any exercise. He also complains of claudication. He said PCP was following.  Risks / Complications include, but not limited to: Death, MI, CVA/TIA, VF/VT (with defibrillation), Bradycardia (need for temporary pacer placement), contrast induced nephropathy, bleeding / bruising / hematoma / pseudoaneurysm, vascular or coronary injury (with possible emergent CT or Vascular Surgery), adverse medication reactions, infection.    Consent: Risks of procedure as well as the alternatives and risks of each were explained to the (patient/caregiver).  Consent for procedure obtained.  Procedure: The patient was brought to the 2nd Floor [facility] in the fasting state and prepped and draped in the usual sterile fashion for (Right radial) access. A modified Allen's test with plethysmography was performed on the right wrist demonstrating adequate Ulnar Artery collateral flow.    Time Out: Verified patient identification, verified procedure, site/side was marked, verified correct patient position, special equipment/implants available, radiation safety measures in place (including badges and shielding), medications/allergies/relevent history reviewed, required imaging and test results available.  Performed  Procedure: The right wrist was anesthetized with 1% subcutaneous Lidocaine.  The right radial artery was accessed using the Seldinger Technique with placement of a 6 Fr Glide Sheath. The sheath was aspirated and flushed.  Then a total of 20 ml of standard Radial Artery Cocktail (see medications) was infused.  A 5 Fr TIG  4.0 Catheter was advanced of over a Safety J wire into the ascending Aorta.  The catheter was used to engage the left and right  coronary artery.  Multiple cineangiographic views of the left and right coronary artery system(s) were performed. This catheter was then exchanged over the Long Exchange Safety J wire for an angled Pigtail catheter that was advanced across the Aortic Valve.  LV hemodynamics were measured and the catheter was pulled back across the Aortic Valve for measurement of "pull-back" gradient.  The catheter and the wire were removed completely out of the body.  The sheath was removed in the Cath Lab with a TR band placed at 12 ml Air at 0820 (time).  Reverse Allen's test did  reveal non-occlusive hemostasis.  Recovery: The patient was transported to the cath lab holding area in stable condition.   The patient  was stable before, during and following the procedure.   Patient did tolerate procedure well. There were not complications.  EBL: Minimal  Medications:  Premedication: none  Sedation:  2 mg IV Versed, 25 mcg IV Fentanyl  Contrast:  60 ml Omnipaque  Local Anesthesia: 3 cc 1% lidocaine  10 mg IV hydralazine  20 ml Radial cocktail  5000 IU IV heparin  Hemodynamics:  Central Aortic Pressure / Mean Aortic Pressure: 145/87  LV Pressure / LV End diastolic Pressure:  20  Coronary Angiographic Data:  Left Main:  Long vessel, angiographically normal, trifurcates into LAD, LCX and Ramus branches  Left Anterior Descending (LAD):  There is a proximal 20-30% eccentric, hazy plaque and mild mid-LAD stenosis, vessel reaches the apex  1st diagonal (D1):  Smaller branch, mild diffuse disease  Circumflex (LCx):  AV groove vessel, gives off 2 OM branches, mild ostial stenosis.  1st obtuse marginal:  Smaller vessel, no stenosis.  2nd obtuse marginal:  Minimal luminal irregularities  Ramus Intermedius:  There is a tubular 90% mid-Ramus stenosis, the vessel coarses to the lateral wall  Right Coronary Artery: Dominant artery. Mild luminal irregularities.  right ventricle branch of right coronary  artery: No stenosis.  posterior descending artery: There is a 70% ostial bifurcation stenosis, however, this is <2.0 mm in diameter  posterior lateral branch:  Ostial stenosis (see above)  Impression: 1.  90% tubular mid-Ramus stenosis - likely anginal culprit, given ischemia in the lateral wall on NST 2.  70% bifurcation PDA/PLB stenosis - small vessel, <2.0 mm 3.  LVEDP = 20 mmHg  Plan: 1.  Case discussed with Dr. [place] (interventional cardiology) and films reviewed with him. Given concern about medication non-compliance as well as low platelet count, we will recommend medical therapy for now.  2.  Encouraged BP medication compliance and will start imdur 30 mg daily. Discussed with his wife as well. 3.  Follow-up with me in 2-3 weeks. Will need hematology referral for thrombocytopenia work-up. 4.  If he fails medical therapy and remains complaint AND has recurrent angina at some point, he may be a candidate for PCI to the Ramus.  The case and results was discussed with the patient and family if available.  The case and results was not discussed with the patient's PCP. The case and results was discussed with the patient's Cardiologist.  Time Spent Directly with the Patient:  60 minutes  Chrystie Nose, MD, [facility] Attending Cardiologist [facility]  Georgetta Crafton C 09/27/2014, 10:53 AM

## 2014-09-27 NOTE — H&P (Signed)
     INTERVAL PROCEDURE H&P  History and Physical Interval Note:  09/27/2014 7:55 AM  Kevin [name] has presented today for their planned procedure. The various methods of treatment have been discussed with the patient and family. After consideration of risks, benefits and other options for treatment, the patient has consented to the procedure.  The patients' outpatient history has been reviewed, patient examined, and no change in status from most recent office note within the past 30 days. I have reviewed the patients' chart and labs and will proceed as planned. Questions were answered to the patient's satisfaction.   Cath Lab Visit (complete for each Cath Lab visit)  Clinical Evaluation Leading to the Procedure:   ACS: No.  Non-ACS:    Anginal Classification: CCS III  Anti-ischemic medical therapy: Maximal Therapy (2 or more classes of medications)  Non-Invasive Test Results: Intermediate-risk stress test findings: cardiac mortality 1-3%/year  Prior CABG: No previous CABG  Chrystie Nose, MD, [facility] Attending Cardiologist [facility]  Hermina Barnard C 09/27/2014, 7:55 AM

## 2014-10-27 ENCOUNTER — Ambulatory Visit: Payer: BC Managed Care – PPO | Admitting: Internal Medicine

## 2014-11-15 ENCOUNTER — Telehealth: Payer: Self-pay | Admitting: Internal Medicine

## 2014-11-15 MED ORDER — CARVEDILOL 25 MG PO TABS: 25.0000 mg | ORAL_TABLET | Freq: Two times a day (BID) | ORAL | Status: DC

## 2014-11-15 MED ORDER — DILTIAZEM HCL ER COATED BEADS 240 MG PO CP24: 240.0000 mg | ORAL_CAPSULE | Freq: Every day | ORAL | Status: DC

## 2014-11-15 MED ORDER — ISOSORBIDE MONONITRATE ER 30 MG PO TB24: 30.0000 mg | ORAL_TABLET | Freq: Every day | ORAL | Status: DC

## 2014-11-15 MED ORDER — FOLIC ACID 1 MG PO TABS: 1.0000 mg | ORAL_TABLET | Freq: Every day | ORAL | Status: DC

## 2014-11-15 NOTE — Telephone Encounter (Signed)
Refill submitted to patient's preferred pharmacy. Informed patient. Pt voiced understanding, no other stated concerns at this time.  

## 2014-11-15 NOTE — Telephone Encounter (Signed)
°  1. Which medications need to be refilled? Carvedilol  2. Which pharmacy is medication to be sent to? [facility] on [address]  3. Do they need a 30 day or 90 day supply? He would like a 90 day  4. Would they like a call back once the medication has been sent to the pharmacy? Yes b/c he is completely out

## 2015-02-26 ENCOUNTER — Other Ambulatory Visit: Payer: Self-pay | Admitting: Internal Medicine

## 2015-02-27 NOTE — Telephone Encounter (Signed)
Rx has been sent to the pharmacy electronically. ° °

## 2015-08-23 ENCOUNTER — Inpatient Hospital Stay (HOSPITAL_COMMUNITY)
Admission: EM | Admit: 2015-08-23 | Discharge: 2015-08-27 | DRG: 065 | Disposition: A | Discharge status: Home / Self Care | Payer: BLUE CROSS/BLUE SHIELD | Attending: Internal Medicine | Admitting: Internal Medicine

## 2015-08-23 ENCOUNTER — Emergency Department (HOSPITAL_COMMUNITY): Payer: BLUE CROSS/BLUE SHIELD

## 2015-08-23 ENCOUNTER — Encounter (HOSPITAL_COMMUNITY): Payer: Self-pay | Admitting: *Deleted

## 2015-08-23 ENCOUNTER — Observation Stay (HOSPITAL_COMMUNITY): Payer: BLUE CROSS/BLUE SHIELD

## 2015-08-23 DIAGNOSIS — I10 Essential (primary) hypertension: Secondary | ICD-10-CM | POA: Diagnosis present

## 2015-08-23 DIAGNOSIS — R0609 Other forms of dyspnea: Secondary | ICD-10-CM | POA: Diagnosis present

## 2015-08-23 DIAGNOSIS — F17210 Nicotine dependence, cigarettes, uncomplicated: Secondary | ICD-10-CM | POA: Diagnosis present

## 2015-08-23 DIAGNOSIS — N183 Chronic kidney disease, stage 3 unspecified: Secondary | ICD-10-CM | POA: Diagnosis present

## 2015-08-23 DIAGNOSIS — I6340 Cerebral infarction due to embolism of unspecified cerebral artery: Secondary | ICD-10-CM | POA: Diagnosis not present

## 2015-08-23 DIAGNOSIS — I482 Chronic atrial fibrillation: Secondary | ICD-10-CM | POA: Diagnosis present

## 2015-08-23 DIAGNOSIS — I4819 Other persistent atrial fibrillation: Secondary | ICD-10-CM | POA: Diagnosis present

## 2015-08-23 DIAGNOSIS — I5032 Chronic diastolic (congestive) heart failure: Secondary | ICD-10-CM | POA: Diagnosis not present

## 2015-08-23 DIAGNOSIS — Z720 Tobacco use: Secondary | ICD-10-CM | POA: Diagnosis present

## 2015-08-23 DIAGNOSIS — I639 Cerebral infarction, unspecified: Secondary | ICD-10-CM | POA: Diagnosis present

## 2015-08-23 DIAGNOSIS — R471 Dysarthria and anarthria: Secondary | ICD-10-CM | POA: Diagnosis present

## 2015-08-23 DIAGNOSIS — I739 Peripheral vascular disease, unspecified: Secondary | ICD-10-CM | POA: Diagnosis present

## 2015-08-23 DIAGNOSIS — G459 Transient cerebral ischemic attack, unspecified: Secondary | ICD-10-CM | POA: Diagnosis present

## 2015-08-23 DIAGNOSIS — E785 Hyperlipidemia, unspecified: Secondary | ICD-10-CM | POA: Diagnosis present

## 2015-08-23 DIAGNOSIS — E669 Obesity, unspecified: Secondary | ICD-10-CM | POA: Diagnosis present

## 2015-08-23 DIAGNOSIS — I130 Hypertensive heart and chronic kidney disease with heart failure and stage 1 through stage 4 chronic kidney disease, or unspecified chronic kidney disease: Secondary | ICD-10-CM | POA: Diagnosis present

## 2015-08-23 DIAGNOSIS — R29704 NIHSS score 4: Secondary | ICD-10-CM | POA: Diagnosis present

## 2015-08-23 DIAGNOSIS — F1290 Cannabis use, unspecified, uncomplicated: Secondary | ICD-10-CM | POA: Diagnosis present

## 2015-08-23 DIAGNOSIS — I150 Renovascular hypertension: Secondary | ICD-10-CM

## 2015-08-23 DIAGNOSIS — K7030 Alcoholic cirrhosis of liver without ascites: Secondary | ICD-10-CM | POA: Diagnosis not present

## 2015-08-23 DIAGNOSIS — Z9114 Patient's other noncompliance with medication regimen: Secondary | ICD-10-CM

## 2015-08-23 DIAGNOSIS — B1920 Unspecified viral hepatitis C without hepatic coma: Secondary | ICD-10-CM | POA: Diagnosis present

## 2015-08-23 DIAGNOSIS — R29818 Other symptoms and signs involving the nervous system: Secondary | ICD-10-CM

## 2015-08-23 DIAGNOSIS — D6959 Other secondary thrombocytopenia: Secondary | ICD-10-CM | POA: Diagnosis present

## 2015-08-23 DIAGNOSIS — D696 Thrombocytopenia, unspecified: Secondary | ICD-10-CM | POA: Diagnosis present

## 2015-08-23 DIAGNOSIS — Z91148 Patient's other noncompliance with medication regimen for other reason: Secondary | ICD-10-CM

## 2015-08-23 DIAGNOSIS — Z7982 Long term (current) use of aspirin: Secondary | ICD-10-CM

## 2015-08-23 DIAGNOSIS — R0600 Dyspnea, unspecified: Secondary | ICD-10-CM | POA: Diagnosis present

## 2015-08-23 DIAGNOSIS — F1010 Alcohol abuse, uncomplicated: Secondary | ICD-10-CM | POA: Diagnosis present

## 2015-08-23 DIAGNOSIS — K7460 Unspecified cirrhosis of liver: Secondary | ICD-10-CM | POA: Diagnosis present

## 2015-08-23 DIAGNOSIS — R4701 Aphasia: Secondary | ICD-10-CM | POA: Diagnosis present

## 2015-08-23 DIAGNOSIS — Z9119 Patient's noncompliance with other medical treatment and regimen: Secondary | ICD-10-CM

## 2015-08-23 DIAGNOSIS — F1410 Cocaine abuse, uncomplicated: Secondary | ICD-10-CM | POA: Diagnosis present

## 2015-08-23 DIAGNOSIS — E876 Hypokalemia: Secondary | ICD-10-CM | POA: Diagnosis present

## 2015-08-23 DIAGNOSIS — Z6822 Body mass index (BMI) 22.0-22.9, adult: Secondary | ICD-10-CM

## 2015-08-23 DIAGNOSIS — I481 Persistent atrial fibrillation: Secondary | ICD-10-CM | POA: Diagnosis present

## 2015-08-23 DIAGNOSIS — I2510 Atherosclerotic heart disease of native coronary artery without angina pectoris: Secondary | ICD-10-CM | POA: Diagnosis present

## 2015-08-23 LAB — I-STAT CHEM 8, ED
BUN: 21 mg/dL — ABNORMAL HIGH (ref 6–20)
Calcium, Ion: 1.16 mmol/L (ref 1.13–1.30)
Chloride: 102 mmol/L (ref 101–111)
Creatinine, Ser: 1.5 mg/dL — ABNORMAL HIGH (ref 0.61–1.24)
Glucose, Bld: 95 mg/dL (ref 65–99)
HCT: 47 % (ref 39.0–52.0)
Hemoglobin: 16 g/dL (ref 13.0–17.0)
Potassium: 3.7 mmol/L (ref 3.5–5.1)
Sodium: 141 mmol/L (ref 135–145)
TCO2: 26 mmol/L (ref 0–100)

## 2015-08-23 LAB — DIFFERENTIAL
Basophils Absolute: 0 10*3/uL (ref 0.0–0.1)
Basophils Relative: 0 %
Eosinophils Absolute: 0.1 10*3/uL (ref 0.0–0.7)
Eosinophils Relative: 1 %
Lymphocytes Relative: 12 %
Lymphs Abs: 0.9 10*3/uL (ref 0.7–4.0)
Monocytes Absolute: 0.6 10*3/uL (ref 0.1–1.0)
Monocytes Relative: 8 %
Neutro Abs: 5.7 10*3/uL (ref 1.7–7.7)
Neutrophils Relative %: 79 %

## 2015-08-23 LAB — I-STAT TROPONIN, ED: Troponin i, poc: 0 ng/mL (ref 0.00–0.08)

## 2015-08-23 LAB — CBC
HCT: 41.6 % (ref 39.0–52.0)
Hemoglobin: 14.2 g/dL (ref 13.0–17.0)
MCH: 34 pg (ref 26.0–34.0)
MCHC: 34.1 g/dL (ref 30.0–36.0)
MCV: 99.5 fL (ref 78.0–100.0)
Platelets: 95 10*3/uL — ABNORMAL LOW (ref 150–400)
RBC: 4.18 MIL/uL — ABNORMAL LOW (ref 4.22–5.81)
RDW: 12.5 % (ref 11.5–15.5)
WBC: 7.3 10*3/uL (ref 4.0–10.5)

## 2015-08-23 LAB — COMPREHENSIVE METABOLIC PANEL
ALT: 25 U/L (ref 17–63)
AST: 26 U/L (ref 15–41)
Albumin: 3.6 g/dL (ref 3.5–5.0)
Alkaline Phosphatase: 109 U/L (ref 38–126)
Anion gap: 7 (ref 5–15)
BUN: 16 mg/dL (ref 6–20)
CO2: 27 mmol/L (ref 22–32)
Calcium: 9 mg/dL (ref 8.9–10.3)
Chloride: 105 mmol/L (ref 101–111)
Creatinine, Ser: 1.41 mg/dL — ABNORMAL HIGH (ref 0.61–1.24)
GFR calc Af Amer: 60 mL/min — ABNORMAL LOW (ref >60)
GFR calc non Af Amer: 52 mL/min — ABNORMAL LOW (ref >60)
Glucose, Bld: 94 mg/dL (ref 65–99)
Potassium: 3.8 mmol/L (ref 3.5–5.1)
Sodium: 139 mmol/L (ref 135–145)
Total Bilirubin: 0.7 mg/dL (ref 0.3–1.2)
Total Protein: 7.1 g/dL (ref 6.5–8.1)

## 2015-08-23 LAB — PROTIME-INR
INR: 1.05 (ref 0.00–1.49)
Prothrombin Time: 13.9 seconds (ref 11.6–15.2)

## 2015-08-23 LAB — APTT: aPTT: 26 seconds (ref 24–37)

## 2015-08-23 MED ORDER — SENNOSIDES-DOCUSATE SODIUM 8.6-50 MG PO TABS: 1.0000 | ORAL_TABLET | Freq: Every evening | ORAL | Status: DC | PRN

## 2015-08-23 MED ORDER — METOPROLOL TARTRATE 1 MG/ML IV SOLN
5.0000 mg | Freq: Once | INTRAVENOUS | Status: AC
  Administered 2015-08-23: 5 mg via INTRAVENOUS
  Filled 2015-08-23: qty 5

## 2015-08-23 MED ORDER — STROKE: EARLY STAGES OF RECOVERY BOOK
Freq: Once | Status: AC
  Administered 2015-08-24: 06:00:00
  Filled 2015-08-23 (×2): qty 1

## 2015-08-23 MED ORDER — ASPIRIN 325 MG PO TABS
325.0000 mg | ORAL_TABLET | Freq: Every day | ORAL | Status: DC
  Administered 2015-08-23 – 2015-08-24 (×2): 325 mg via ORAL
  Filled 2015-08-23 (×2): qty 1

## 2015-08-23 MED ORDER — HYDRALAZINE HCL 20 MG/ML IJ SOLN
10.0000 mg | Freq: Once | INTRAMUSCULAR | Status: AC
Start: 2015-08-23 — End: 2015-08-23
  Administered 2015-08-23: 10 mg via INTRAVENOUS
  Filled 2015-08-23: qty 1

## 2015-08-23 MED ORDER — LABETALOL HCL 5 MG/ML IV SOLN
10.0000 mg | Freq: Once | INTRAVENOUS | Status: AC
  Administered 2015-08-23: 10 mg via INTRAVENOUS
  Filled 2015-08-23: qty 4

## 2015-08-23 MED ORDER — ENOXAPARIN SODIUM 40 MG/0.4ML ~~LOC~~ SOLN: 40.0000 mg | SUBCUTANEOUS | Status: DC

## 2015-08-23 MED ORDER — ASPIRIN 300 MG RE SUPP: 300.0000 mg | Freq: Every day | RECTAL | Status: DC

## 2015-08-23 MED ORDER — HYDRALAZINE HCL 20 MG/ML IJ SOLN
5.0000 mg | INTRAMUSCULAR | Status: DC | PRN
  Administered 2015-08-23 – 2015-08-25 (×2): 5 mg via INTRAVENOUS
  Filled 2015-08-23 (×4): qty 1

## 2015-08-23 MED ORDER — CLOPIDOGREL BISULFATE 75 MG PO TABS
75.0000 mg | ORAL_TABLET | Freq: Every day | ORAL | Status: DC
  Administered 2015-08-23 – 2015-08-24 (×2): 75 mg via ORAL
  Filled 2015-08-23 (×2): qty 1

## 2015-08-23 MED ORDER — HYDRALAZINE HCL 20 MG/ML IJ SOLN
15.0000 mg | Freq: Once | INTRAMUSCULAR | Status: AC
  Administered 2015-08-23: 15 mg via INTRAVENOUS

## 2015-08-23 NOTE — ED Notes (Signed)
Pt returned from MRI °

## 2015-08-23 NOTE — ED Notes (Signed)
Pt spilled the urinal accidentally.  Bed linen changed

## 2015-08-23 NOTE — ED Notes (Signed)
Patient to room with RN and Code Stroke team. Wife at bedside. Patient being placed on monitor and in gown. Neuro at bedside for exam.

## 2015-08-23 NOTE — ED Notes (Signed)
Patient transported to MRI 

## 2015-08-23 NOTE — ED Notes (Signed)
Patient to CT with RN and EDP

## 2015-08-23 NOTE — ED Notes (Signed)
The pts bp re\mains elevated  Regardless of his bp medication.  The pt still has a headache and he has \\an  intermittent cough.  The cough is normal according to his wife.  He is still a smoker
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
## 2015-08-23 NOTE — Code Documentation (Signed)
64yo male arriving to [facility] via private vehicle at 48.  [name] reports he was at his baseline at 1000 when she left the house.  Patient reports that he did not leave the house and did not talk to anyone until 1530 when he spoke with his wife and she noticed his speech was different.  Patient presented to the ED where a code stroke was called.  Patient to CT.  Stroke team to the bedside.  Dr. Lavon Paganini to the bedside.  NIHSS 4, see documentation for details and code stroke times.  Patient with decreased vision to the left in the left eye (questionable if this is his baseline) and slight left facial droop and left leg drift.  LKW 1000 per patient's wife.  Patient reports not realizing any symptoms until talking to his wife.  Patient with known atrial fibrillation on ASA.  Patient is outside the window for treatment with tPA.  Patient hypertensive, Labetalol 10mg  IVP ordered per Neuro PA.  Patient to be admitted.  Bedside handoff with ED RNs [place] and [place].

## 2015-08-23 NOTE — ED Notes (Signed)
C/o a headache that he reports that he has had  Today  bp still up

## 2015-08-23 NOTE — Consult Note (Signed)
Requesting Physician: Dr. Hyacinth Meeker    Chief Complaint: possible stroke  HPI:                                                                                                                                         Kevin [name] is an 64 y.o. male male with history of right craniotomy who seemed to be normal at 1000 hours. When wife called him at 1500 hours she thought his speech was slurred and he was complaining of blurred vision. On arrival to ED his speech cleared and his blurred vision cleared.  Currently he has a possible left field cut but he states this may be old. patient has known Afib but is only on ASA.   Date last known well: Date: 08/23/2015 Time last known well: Time: 10:00 tPA Given: No: out of window     Past Medical History  Diagnosis Date  . PAD (peripheral artery disease)   . Hypertension   . Cocaine abuse   . ETOH abuse   . Tobacco abuse   . Cholelithiases 05/01/2012  . Dysrhythmia     afib  . CHF (congestive heart failure)   . Blood transfusion without reported diagnosis   . Hepatitis C antibody test positive 04/2012  . Claudication     bilateral  . Hyperpigmentation   . Thrombocytopenia   . Persistent atrial fibrillation 08/23/2014    Past Surgical History  Procedure Laterality Date  . Endovascular stent insertion  right leg  . Tee without cardioversion  05/05/2012    Procedure: TRANSESOPHAGEAL ECHOCARDIOGRAM (TEE);  Surgeon: Thurmon Fair, MD;  Location: [facility] ENDOSCOPY;  Service: Cardiovascular;  Laterality: N/A;  . Cardioversion  05/05/2012    Procedure: CARDIOVERSION;  Surgeon: Thurmon Fair, MD;  Location: [facility] ENDOSCOPY;  Service: Cardiovascular;  Laterality: N/A;  . Cardiolite      negative for ischemia  . Carotid artery angioplasty    . Doppler echocardiography    . Cardiovascular stress test    . Left heart catheterization with coronary angiogram N/A 05/01/2012    Procedure: LEFT HEART CATHETERIZATION WITH CORONARY ANGIOGRAM;  Surgeon: Marykay Lex, MD;  Location: [facility] CATH LAB;  Service: Cardiovascular;  Laterality: N/A;  . Left heart catheterization with coronary angiogram N/A 09/27/2014    Procedure: LEFT HEART CATHETERIZATION WITH CORONARY ANGIOGRAM;  Surgeon: Chrystie Nose, MD;  Location: [facility] CATH LAB;  Service: Cardiovascular;  Laterality: N/A;    Family History  Problem Relation Age of Onset  . Seizures Daughter   . Cancer Mother    Social History:  reports that he has been smoking Cigarettes.  He has a 10 pack-year smoking history. He has never used smokeless tobacco. He reports that he drinks alcohol. He reports that he does not use illicit drugs.  Allergies: No Known Allergies  Medications:  Current Facility-Administered Medications  Medication Dose Route Frequency Provider Last Rate Last Dose  . labetalol (NORMODYNE,TRANDATE) injection 10 mg  10 mg Intravenous Once Ulice Dash, PA-C       Current Outpatient Prescriptions  Medication Sig Dispense Refill  . aspirin EC 81 MG tablet Take 81 mg by mouth daily.    . carvedilol (COREG) 25 MG tablet Take 1 tablet (25 mg total) by mouth 2 (two) times daily. 180 tablet 2  . [employer] B-1 100 MG tablet Take 100 mg by mouth daily.  1  . diltiazem (CARDIZEM CD) 240 MG 24 hr capsule Take 1 capsule (240 mg total) by mouth daily. 90 capsule 2  . folic acid (FOLVITE) 1 MG tablet Take 1 tablet (1 mg total) by mouth daily. Need appointment before anymore refills 30 tablet 1  . isosorbide mononitrate (IMDUR) 30 MG 24 hr tablet Take 1 tablet (30 mg total) by mouth daily. 90 tablet 2     ROS:                                                                                                                                       History obtained from the patient  General ROS: negative for - chills, fatigue, fever, night sweats, weight gain or weight loss Psychological  ROS: negative for - behavioral disorder, hallucinations, memory difficulties, mood swings or suicidal ideation Ophthalmic ROS: negative for - blurry vision, double vision, eye pain or loss of vision ENT ROS: negative for - epistaxis, nasal discharge, oral lesions, sore throat, tinnitus or vertigo Allergy and Immunology ROS: negative for - hives or itchy/watery eyes Hematological and Lymphatic ROS: negative for - bleeding problems, bruising or swollen lymph nodes Endocrine ROS: negative for - galactorrhea, hair pattern changes, polydipsia/polyuria or temperature intolerance Respiratory ROS: negative for - cough, hemoptysis, shortness of breath or wheezing Cardiovascular ROS: negative for - chest pain, dyspnea on exertion, edema or irregular heartbeat Gastrointestinal ROS: negative for - abdominal pain, diarrhea, hematemesis, nausea/vomiting or stool incontinence Genito-Urinary ROS: negative for - dysuria, hematuria, incontinence or urinary frequency/urgency Musculoskeletal ROS: negative for - joint swelling or muscular weakness Neurological ROS: as noted in HPI Dermatological ROS: negative for rash and skin lesion changes  Neurologic Examination:                                                                                                      There were no vitals taken for this visit.  HEENT-  Normocephalic, no  lesions, without obvious abnormality.  Normal external eye and conjunctiva.    Normal auditory canals and external ears. Normal external nose, mucus membranes and septum.  Normal pharynx. Cardiovascular- irregularly irregular rhythm, pulses palpable throughout     Neurological Examination Mental Status: Alert, oriented, thought content appropriate.  Speech fluent without evidence of aphasia.  Able to follow 3 step commands without difficulty. Cranial Nerves: II:  ; Visual fields with possible old left field cut , pupils equal, round, reactive to light and accommodation III,IV, VI:  ptosis not present, extra-ocular motions intact bilaterally V,VII: smile asymmetric on the left, facial light touch sensation normal bilaterally VIII: hearing normal bilaterally IX,X: uvula rises symmetrically XI: bilateral shoulder shrug XII: midline tongue extension Motor: Right : Upper extremity   5/5    Left:     Upper extremity   5/5  Lower extremity   5/5     Lower extremity   5/5 Tone and bulk:normal tone throughout; no atrophy noted Sensory: Pinprick and light touch intact throughout, bilaterally Deep Tendon Reflexes: 1+ and symmetric throughout Plantars: Right: downgoing   Left: downgoing Cerebellar: normal finger-to-nose,  and normal heel-to-shin test Gait: not tested       Lab Results: Basic Metabolic Panel: No results for input(s): NA, K, CL, CO2, GLUCOSE, BUN, CREATININE, CALCIUM, MG, PHOS in the last 168 hours.  Liver Function Tests: No results for input(s): AST, ALT, ALKPHOS, BILITOT, PROT, ALBUMIN in the last 168 hours. No results for input(s): LIPASE, AMYLASE in the last 168 hours. No results for input(s): AMMONIA in the last 168 hours.  CBC: No results for input(s): WBC, NEUTROABS, HGB, HCT, MCV, PLT in the last 168 hours.  Cardiac Enzymes: No results for input(s): CKTOTAL, CKMB, CKMBINDEX, TROPONINI in the last 168 hours.  Lipid Panel: No results for input(s): CHOL, TRIG, HDL, CHOLHDL, VLDL, LDLCALC in the last 168 hours.  CBG: No results for input(s): GLUCAP in the last 168 hours.  Microbiology: Results for orders placed or performed during the hospital encounter of 04/19/14  MRSA PCR Screening     Status: None   Collection Time: 04/19/14  7:10 PM  Result Value Ref Range Status   MRSA by PCR NEGATIVE NEGATIVE Final    Comment:        The GeneXpert MRSA Assay (FDA approved for NASAL specimens only), is one component of a comprehensive MRSA colonization surveillance program. It is not intended to diagnose MRSA infection nor to guide  or monitor treatment for MRSA infections.    Coagulation Studies: No results for input(s): LABPROT, INR in the last 72 hours.  Imaging: CT of the head done in the ER showed no definite acute pathology. A right parietal craniotomy is noted which per history is from childhood due to a traumatic head injury.    Assessment: 65 y.o. male with transient slurred speech and blurred vision which has cleared.  Unclear if his left field cut is old or new.  Either way he is out of window for tPA and symptoms minimal for intervention. Given his risk factors he will need a Stroke work up in the hospital.   Stroke Risk Factors - atrial fibrillation, hypertension and smoking  Recommend 1. HgbA1c, fasting lipid panel 2. MRI, MRA  of the brain without contrast 3. PT consult, OT consult, Speech consult 4. Echocardiogram, USG carotids 6. Antiplatelet med: Aspirin - dose 325 mg daily. Will add Plavix 75 mg daily to his regimen now. After completing the MRI brain, if no  sizable acute stroke is noted, we'll consider starting anticoagulation for atrial fibrillation.  8. Telemetry monitoring 9. Frequent neuro checks 10 NPO until passes stroke swallow screen  Patient evaluated along with physician assistant, Felicie Morn. Helped him formulate the plan. Agree with the documented assessment.

## 2015-08-23 NOTE — ED Notes (Signed)
Discussed the pts bp with dr Fannie Knee  He has ordered lopressor for his bp.

## 2015-08-23 NOTE — ED Notes (Signed)
thge pt is alert wife at the bedside.  The bp is  Increasing.  Call placed to the admitting doctor.  Hydralazine ordered every 4 hours  But it has onky been  2 hours.  He did not take his home bp med todayt

## 2015-08-23 NOTE — ED Notes (Signed)
Patient came to ED via POV with wife for slurred speech and blurred vision. LSN was 10a when wife left for work, states when she called to talk to him this afternoon speech sounded garbled. When she arrived home she felt that patients speech was slurred and that he had a left sided facial droop. Patient states that he thinks he did not take his daily medications.

## 2015-08-23 NOTE — H&P (Addendum)
History and Physical    [name]:[id number] DOB: 1951/07/18 DOA: 08/23/2015  Referring physician: Dr. Hyacinth Meeker PCP: Jackie Plum, MD  Specialists: Neurology  Chief Complaint: slurred speech  HPI: Kevin [name] is a 64 y.o. male has a past medical history significant for chronic atrial fibrillation, hypertension, hyperlipidemia, obesity, tobacco abuse, is being brought to the emergency room as a code stroke. He says that he's been having trouble speaking this afternoon around 3 or 4 PM. His wife, who is in the room, states that when she talked with him over the phone he appeared to have slurred speech. Patient also endorses blurred vision that appeared at the same time as his slurred speech. He denies any chest pain, denies any palpitations, he denies any fever or chills, he denies any abdominal pain, nausea, vomiting or diarrhea. He denies any weakness in his arms or legs. He denies any balance issues. His slurred speech and blurry vision has slowly started to improve, and they're almost resolved on my evaluation. In the ED, patient underwent a CT scan of the brain without acute changes, neurology was consulted, and TRH was asked for admission for TIA. His labs were pertinent for mild creatinine elevation and thrombocytopenia.  Review of Systems: As per history of present illness, otherwise 10 point review of systems negative  Past Medical History  Diagnosis Date  . PAD (peripheral artery disease) (HCC)   . Hypertension   . Cocaine abuse   . ETOH abuse   . Tobacco abuse   . Cholelithiases 05/01/2012  . Dysrhythmia     afib  . CHF (congestive heart failure) (HCC)   . Blood transfusion without reported diagnosis   . Hepatitis C antibody test positive 04/2012  . Claudication (HCC)     bilateral  . Hyperpigmentation   . Thrombocytopenia (HCC)   . Persistent atrial fibrillation (HCC) 08/23/2014   Past Surgical History  Procedure Laterality Date  . Endovascular stent  insertion  right leg  . Tee without cardioversion  05/05/2012    Procedure: TRANSESOPHAGEAL ECHOCARDIOGRAM (TEE);  Surgeon: Thurmon Fair, MD;  Location: [facility] ENDOSCOPY;  Service: Cardiovascular;  Laterality: N/A;  . Cardioversion  05/05/2012    Procedure: CARDIOVERSION;  Surgeon: Thurmon Fair, MD;  Location: [facility] ENDOSCOPY;  Service: Cardiovascular;  Laterality: N/A;  . Cardiolite      negative for ischemia  . Carotid artery angioplasty    . Doppler echocardiography    . Cardiovascular stress test    . Left heart catheterization with coronary angiogram N/A 05/01/2012    Procedure: LEFT HEART CATHETERIZATION WITH CORONARY ANGIOGRAM;  Surgeon: Marykay Lex, MD;  Location: [facility] CATH LAB;  Service: Cardiovascular;  Laterality: N/A;  . Left heart catheterization with coronary angiogram N/A 09/27/2014    Procedure: LEFT HEART CATHETERIZATION WITH CORONARY ANGIOGRAM;  Surgeon: Chrystie Nose, MD;  Location: [facility] CATH LAB;  Service: Cardiovascular;  Laterality: N/A;   Social History:  reports that he has been smoking Cigarettes.  He has a 10 pack-year smoking history. He has never used smokeless tobacco. He reports that he drinks alcohol. He reports that he does not use illicit drugs.  No Known Allergies  Family History  Problem Relation Age of Onset  . Seizures Daughter   . Cancer Mother     Prior to Admission medications   Medication Sig Start Date End Date Taking? Authorizing Provider  aspirin EC 81 MG tablet Take 81 mg by mouth daily.   Yes Historical Provider, MD  carvedilol (COREG) 25 MG tablet Take 1 tablet (25 mg total) by mouth 2 (two) times daily. 11/15/14  Yes Chrystie Nose, MD  folic acid (FOLVITE) 1 MG tablet Take 1 tablet (1 mg total) by mouth daily. Need appointment before anymore refills 02/27/15  Yes Chrystie Nose, MD  diltiazem (CARDIZEM CD) 240 MG 24 hr capsule Take 1 capsule (240 mg total) by mouth daily. Patient not taking: Reported on 08/23/2015 11/15/14   Chrystie Nose, MD    isosorbide mononitrate (IMDUR) 30 MG 24 hr tablet Take 1 tablet (30 mg total) by mouth daily. Patient not taking: Reported on 08/23/2015 11/15/14   Chrystie Nose, MD   Physical Exam: Filed Vitals:   08/23/15 1628 08/23/15 1630 08/23/15 1645 08/23/15 1700  BP:  210/154 194/104 206/104  Pulse:  46 91 90  Temp: 98.3 F (36.8 C)     TempSrc: Oral     Resp:  SpO2:  100% 93% 96%     GENERAL: NAD  HEENT: head NCAT, no scleral icterus. Pupils round and reactive.   NECK: Supple. No carotid bruits. No lymphadenopathy or thyromegaly.  LUNGS: Clear to auscultation. No wheezing or crackles  HEART: Irregularly irregular, no murmurs. 2+ pulses, no JVD, no peripheral edema  ABDOMEN: Soft, nontender, and nondistended. Positive bowel sounds. N  EXTREMITIES: Without any cyanosis, clubbing, rash, lesions or edema.  NEUROLOGIC: Alert and oriented x3. Cranial nerves II through XII are grossly intact. Strength 5/5 in all 4. No apparent slurred speech evaluated    Labs on Admission:  Basic Metabolic Panel:  Recent Labs Lab 08/23/15 1619 08/23/15 1630  NA 139 141  K 3.8 3.7  CL 105 102  CO2 27  --   GLUCOSE 94 95  BUN 16 21*  CREATININE 1.41* 1.50*  CALCIUM 9.0  --    Liver Function Tests:  Recent Labs Lab 08/23/15 1619  AST 26  ALT 25  ALKPHOS 109  BILITOT 0.7  PROT 7.1  ALBUMIN 3.6   CBC:  Recent Labs Lab 08/23/15 1619 08/23/15 1630  WBC 7.3  --   NEUTROABS 5.7  --   HGB 14.2 16.0  HCT 41.6 47.0  MCV 99.5  --   PLT 95*  --     Radiological Exams on Admission: Ct Head Wo Contrast  08/23/2015  CLINICAL DATA:  Slurred speech. EXAM: CT HEAD WITHOUT CONTRAST TECHNIQUE: Contiguous axial images were obtained from the base of the skull through the vertex without intravenous contrast. COMPARISON:  None. FINDINGS: There are postsurgical changes from a small craniectomy in the right parietal calvarium. There is a small well corticated focal defect in the  medial left parietal calvarium, reportedly the sequela of childhood injury per discussion with the ordering provider. No evidence of parenchymal hemorrhage or extra-axial fluid collection. No mass lesion, mass effect, or midline shift. No CT evidence of acute infarction. There is intracranial atherosclerosis. Nonspecific subcortical and periventricular white matter hypodensity, most in keeping with chronic small vessel ischemic change. Cerebral volume is age appropriate. No ventriculomegaly. The visualized paranasal sinuses are essentially clear. The mastoid air cells are unopacified. No evidence of calvarial fracture. IMPRESSION: 1.  No evidence of acute intracranial abnormality. 2. Intracranial atherosclerosis and chronic small vessel ischemic white matter change. These results were called by telephone at the time of interpretation on 08/23/2015 at 4:22 pm to Dr. Lavon Paganini, who verbally acknowledged these results. Electronically Signed   By: Jannifer Rodney.D.  On: 08/23/2015 16:29    EKG: Independently reviewed. Atrial fibrillation  Assessment/Plan Active Problems:   PAD (peripheral artery disease), PTA to Lt. SFA in 2009, total occlusion mid Rt SFA with collaterals    HTN (hypertension) -Accelerated with End organ involvement   Tobacco abuse   Obesity   Thrombocytopenia on admission 04/30/12, thought to be secondary to Xarelto, Platelets no better 2015   DOE (dyspnea on exertion)   Cirrhosis of liver (HCC)   Persistent atrial fibrillation (HCC)   Chronic diastolic heart failure (HCC)   H/O medication noncompliance   TIA (transient ischemic attack)   CKD (chronic kidney disease) stage 3, GFR 30-59 ml/min   TIA / CVA - Patient will be admitted to the hospital, neurology consulted and seen the patient in the emergency room - MRI MRA of the brain without contrast - 2-D echo - Carotid Dopplers - Hemoglobin A1c and fasting lipid panel in the morning - Stroke team to follow  Chronic diastolic  heart failure - 2-D echo to be repeated for #1 - Patient reports some dyspnea on exertion for the past several months  CKD stage III - Cr at baseline. monitor  Atrial fibrillation, persistent - patient's CHA2DS2-VASc Score for Stroke Risk is 4 - Monitor patient on telemetry. We'll consider starting anticoagulation given TIA, awaiting MRI for now. Apparently he was observed during the past, however this was stopped due to severe thrombocytopenia. When he was hospitalized last year, he was deemed to be a poor Coumadin candidate due to bleeding risks as well as his history of noncompliance  Hypertension - Patient hypertensive in the ED in the 200s systolic, will allow permissive hypertension on his systolic blood pressure goes above 210, hydralazine when necessary ordered for that CKD stage III - Likely in the setting of poorly controlled long-standing hypertension  History of polysubstance abuse - Patient said that he used to drink a lot more in the past, he now endorses very rare alcohol intake, his wife confirms - Continues to smoke, counseled for cessation  Liver cirrhosis - Multifactorial due to alcohol as well as hepatitis C - Currently appears compensated  Thrombocytopenia - Likely in the setting of liver disease,   Diet: heart healthy Fluids: none DVT Prophylaxis: SCD  Code Status: Full  Family Communication: d/w wife bedside  Disposition Plan: admit to tele    Nakai Pollio M. Elvera Lennox, MD [facility] Pager [phone]  If 7PM-7AM, please contact night-coverage [email or web address] Password [email or web address] 08/23/2015, 5:28 PM

## 2015-08-23 NOTE — ED Notes (Signed)
Katherine schorr called back gave order for hydral;azine 15mg .  Iv  And she wants plavix given tonight..  Hydralazine given over 4 minutes

## 2015-08-24 DIAGNOSIS — I481 Persistent atrial fibrillation: Secondary | ICD-10-CM | POA: Diagnosis present

## 2015-08-24 DIAGNOSIS — Z7982 Long term (current) use of aspirin: Secondary | ICD-10-CM | POA: Diagnosis not present

## 2015-08-24 DIAGNOSIS — E785 Hyperlipidemia, unspecified: Secondary | ICD-10-CM | POA: Diagnosis present

## 2015-08-24 DIAGNOSIS — I10 Essential (primary) hypertension: Secondary | ICD-10-CM

## 2015-08-24 DIAGNOSIS — I482 Chronic atrial fibrillation: Secondary | ICD-10-CM | POA: Diagnosis present

## 2015-08-24 DIAGNOSIS — D696 Thrombocytopenia, unspecified: Secondary | ICD-10-CM

## 2015-08-24 DIAGNOSIS — F1010 Alcohol abuse, uncomplicated: Secondary | ICD-10-CM | POA: Diagnosis present

## 2015-08-24 DIAGNOSIS — I130 Hypertensive heart and chronic kidney disease with heart failure and stage 1 through stage 4 chronic kidney disease, or unspecified chronic kidney disease: Secondary | ICD-10-CM | POA: Diagnosis present

## 2015-08-24 DIAGNOSIS — R29704 NIHSS score 4: Secondary | ICD-10-CM | POA: Diagnosis present

## 2015-08-24 DIAGNOSIS — D6959 Other secondary thrombocytopenia: Secondary | ICD-10-CM | POA: Diagnosis present

## 2015-08-24 DIAGNOSIS — E876 Hypokalemia: Secondary | ICD-10-CM | POA: Diagnosis present

## 2015-08-24 DIAGNOSIS — I739 Peripheral vascular disease, unspecified: Secondary | ICD-10-CM | POA: Diagnosis present

## 2015-08-24 DIAGNOSIS — F1290 Cannabis use, unspecified, uncomplicated: Secondary | ICD-10-CM | POA: Diagnosis present

## 2015-08-24 DIAGNOSIS — E669 Obesity, unspecified: Secondary | ICD-10-CM | POA: Diagnosis present

## 2015-08-24 DIAGNOSIS — I639 Cerebral infarction, unspecified: Secondary | ICD-10-CM | POA: Diagnosis present

## 2015-08-24 DIAGNOSIS — F17210 Nicotine dependence, cigarettes, uncomplicated: Secondary | ICD-10-CM | POA: Diagnosis present

## 2015-08-24 DIAGNOSIS — I2510 Atherosclerotic heart disease of native coronary artery without angina pectoris: Secondary | ICD-10-CM | POA: Diagnosis present

## 2015-08-24 DIAGNOSIS — F1410 Cocaine abuse, uncomplicated: Secondary | ICD-10-CM | POA: Diagnosis present

## 2015-08-24 DIAGNOSIS — I6789 Other cerebrovascular disease: Secondary | ICD-10-CM | POA: Diagnosis not present

## 2015-08-24 DIAGNOSIS — R471 Dysarthria and anarthria: Secondary | ICD-10-CM | POA: Diagnosis present

## 2015-08-24 DIAGNOSIS — I63411 Cerebral infarction due to embolism of right middle cerebral artery: Secondary | ICD-10-CM | POA: Diagnosis not present

## 2015-08-24 DIAGNOSIS — Z6822 Body mass index (BMI) 22.0-22.9, adult: Secondary | ICD-10-CM | POA: Diagnosis not present

## 2015-08-24 DIAGNOSIS — K7030 Alcoholic cirrhosis of liver without ascites: Secondary | ICD-10-CM | POA: Diagnosis present

## 2015-08-24 DIAGNOSIS — I6340 Cerebral infarction due to embolism of unspecified cerebral artery: Secondary | ICD-10-CM | POA: Diagnosis present

## 2015-08-24 DIAGNOSIS — I5032 Chronic diastolic (congestive) heart failure: Secondary | ICD-10-CM | POA: Diagnosis present

## 2015-08-24 DIAGNOSIS — N183 Chronic kidney disease, stage 3 (moderate): Secondary | ICD-10-CM | POA: Diagnosis present

## 2015-08-24 DIAGNOSIS — R4701 Aphasia: Secondary | ICD-10-CM | POA: Diagnosis present

## 2015-08-24 DIAGNOSIS — B1920 Unspecified viral hepatitis C without hepatic coma: Secondary | ICD-10-CM | POA: Diagnosis present

## 2015-08-24 DIAGNOSIS — Z9114 Patient's other noncompliance with medication regimen: Secondary | ICD-10-CM | POA: Diagnosis not present

## 2015-08-24 LAB — COMPREHENSIVE METABOLIC PANEL
ALT: 26 U/L (ref 17–63)
AST: 25 U/L (ref 15–41)
Albumin: 3.7 g/dL (ref 3.5–5.0)
Alkaline Phosphatase: 76 U/L (ref 38–126)
Anion gap: 8 (ref 5–15)
BUN: 11 mg/dL (ref 6–20)
CO2: 24 mmol/L (ref 22–32)
Calcium: 8.7 mg/dL — ABNORMAL LOW (ref 8.9–10.3)
Chloride: 104 mmol/L (ref 101–111)
Creatinine, Ser: 1.16 mg/dL (ref 0.61–1.24)
GFR calc Af Amer: >60 mL/min (ref >60)
GFR calc non Af Amer: >60 mL/min (ref >60)
Glucose, Bld: 104 mg/dL — ABNORMAL HIGH (ref 65–99)
Potassium: 3.3 mmol/L — ABNORMAL LOW (ref 3.5–5.1)
Sodium: 136 mmol/L (ref 135–145)
Total Bilirubin: 1.5 mg/dL — ABNORMAL HIGH (ref 0.3–1.2)
Total Protein: 7 g/dL (ref 6.5–8.1)

## 2015-08-24 LAB — PROTIME-INR
INR: 1.03 (ref 0.00–1.49)
Prothrombin Time: 13.7 seconds (ref 11.6–15.2)

## 2015-08-24 LAB — LIPID PANEL
Cholesterol: 182 mg/dL (ref 0–200)
HDL: 57 mg/dL (ref >40)
LDL Cholesterol: 112 mg/dL — ABNORMAL HIGH (ref 0–99)
Total CHOL/HDL Ratio: 3.2 RATIO
Triglycerides: 64 mg/dL (ref <150)
VLDL: 13 mg/dL (ref 0–40)

## 2015-08-24 LAB — RAPID URINE DRUG SCREEN, HOSP PERFORMED
Amphetamines: NOT DETECTED
Barbiturates: NOT DETECTED
Benzodiazepines: NOT DETECTED
Cocaine: POSITIVE — AB
Opiates: NOT DETECTED
Tetrahydrocannabinol: POSITIVE — AB

## 2015-08-24 MED ORDER — ASPIRIN EC 81 MG PO TBEC
81.0000 mg | DELAYED_RELEASE_TABLET | Freq: Every day | ORAL | Status: DC
  Administered 2015-08-25 – 2015-08-26 (×2): 81 mg via ORAL
  Filled 2015-08-24 (×2): qty 1

## 2015-08-24 MED ORDER — POTASSIUM CHLORIDE CRYS ER 20 MEQ PO TBCR
40.0000 meq | EXTENDED_RELEASE_TABLET | Freq: Once | ORAL | Status: AC
  Administered 2015-08-24: 40 meq via ORAL
  Filled 2015-08-24: qty 2

## 2015-08-24 MED ORDER — PNEUMOCOCCAL VAC POLYVALENT 25 MCG/0.5ML IJ INJ
0.5000 mL | INJECTION | INTRAMUSCULAR | Status: AC
  Administered 2015-08-25: 0.5 mL via INTRAMUSCULAR
  Filled 2015-08-24: qty 0.5

## 2015-08-24 MED ORDER — HYDRALAZINE HCL 20 MG/ML IJ SOLN
10.0000 mg | Freq: Once | INTRAMUSCULAR | Status: AC
  Administered 2015-08-24: 10 mg via INTRAVENOUS
  Filled 2015-08-24: qty 1

## 2015-08-24 MED ORDER — ROSUVASTATIN CALCIUM 20 MG PO TABS
20.0000 mg | ORAL_TABLET | Freq: Every day | ORAL | Status: DC
  Administered 2015-08-24 – 2015-08-27 (×4): 20 mg via ORAL
  Filled 2015-08-24 (×4): qty 1

## 2015-08-24 MED ORDER — DILTIAZEM HCL 60 MG PO TABS
60.0000 mg | ORAL_TABLET | Freq: Three times a day (TID) | ORAL | Status: DC
  Administered 2015-08-24 – 2015-08-26 (×8): 60 mg via ORAL
  Filled 2015-08-24 (×8): qty 1

## 2015-08-24 NOTE — ED Notes (Signed)
Report given to rn on 5 w 

## 2015-08-24 NOTE — Progress Notes (Signed)
STROKE TEAM PROGRESS NOTE   HISTORY Kevin [name] is an 64 y.o. male male with history of right craniotomy who seemed to be normal at 1000 hours (LKW 08/23/2015 at 10 AM). When wife called him at 1500 hours she thought his speech was slurred and he was complaining of blurred vision. On arrival to ED his speech cleared and his blurred vision cleared. Currently he has a possible left field cut but he states this may be old. patient has known Afib but is only on ASA. Patient was not administered TPA secondary to out of the window. He was admitted for further evaluation and treatment.   SUBJECTIVE (INTERVAL HISTORY) No family is at the bedside.  Reviewed patient's history of presentation and medical records and imaging studies personally   OBJECTIVE Temp:  [97.5 F (36.4 C)-98.8 F (37.1 C)] 98.8 F (37.1 C) (12/01 1443) Pulse Rate:  [37-128] 93 (12/01 1443) Cardiac Rhythm:  [-] Atrial fibrillation (12/01 0748) Resp:  [10-28] 15 (12/01 1443) BP: (156-238)/(76-214) 163/98 mmHg (12/01 1443) SpO2:  [96 %-100 %] 100 % (12/01 1443) Weight:  [90.2 kg (198 lb 13.7 oz)] 90.2 kg (198 lb 13.7 oz) (12/01 1700)  CBC:  Recent Labs Lab 08/23/15 1619 08/23/15 1630  WBC 7.3  --   NEUTROABS 5.7  --   HGB 14.2 16.0  HCT 41.6 47.0  MCV 99.5  --   PLT 95*  --     Basic Metabolic Panel:  Recent Labs Lab 08/23/15 1619 08/23/15 1630 08/24/15 0530  NA 139 141 136  K 3.8 3.7 3.3*  CL 105 102 104  CO2 27  --  24  GLUCOSE 94 95 104*  BUN 16 21* 11  CREATININE 1.41* 1.50* 1.16  CALCIUM 9.0  --  8.7*    Lipid Panel:    Component Value Date/Time   CHOL 182 08/24/2015 0530   TRIG 64 08/24/2015 0530   HDL 57 08/24/2015 0530   CHOLHDL 3.2 08/24/2015 0530   VLDL 13 08/24/2015 0530   LDLCALC 112* 08/24/2015 0530   HgbA1c:  Lab Results  Component Value Date   HGBA1C 5.4 04/30/2012   Urine Drug Screen:    Component Value Date/Time   LABOPIA NONE DETECTED 08/24/2015 1703   COCAINSCRNUR  POSITIVE* 08/24/2015 1703   LABBENZ NONE DETECTED 08/24/2015 1703   AMPHETMU NONE DETECTED 08/24/2015 1703   THCU POSITIVE* 08/24/2015 1703   LABBARB NONE DETECTED 08/24/2015 1703      IMAGING  Ct Head Wo Contrast 08/23/2015   1.  No evidence of acute intracranial abnormality. 2. Intracranial atherosclerosis and chronic small vessel ischemic white matter change.   Mri & Mra Brain Wo Contrast 08/23/2015   1. Acute nonhemorrhagic posterior temporal lobe infarct. 2. Moderate atrophy and advanced confluent periventricular white matter disease suggesting chronic microvascular ischemia. 3. Diffuse intracranial atherosclerotic changes. 4. Moderate stenoses of the distal A1 segments bilaterally. 5. Moderate to high-grade stenosis of the distal left M1 segment. 6. Mild to moderate proximal PCA stenoses bilaterally.    PHYSICAL EXAM [name] not in distress. . Afebrile. Head is nontraumatic. Neck is supple without bruit.    Cardiac exam no murmur or gallop. Lungs are clear to auscultation. Distal pulses are well felt. Neurological Exam ;  Awake  Alert oriented x 3. Normal speech and language.eye movements full without nystagmus.fundi were not visualized. Vision acuity and fields appear normal. Hearing is normal. Palatal movements are normal. Face symmetric. Tongue midline. Normal strength, tone, reflexes and  coordination. Normal sensation. Gait deferred. ASSESSMENT/PLAN [name] is a 64 y.o. male with history of right craniotomy, hypertension, tobacco and cocaine use, PAD, CHF, CAD, cigarette smoking, thrombocytopenia presenting with slurred speech and blurred vision. He did not receive IV t-PA due to delay in arrival.   Stroke:  right posterior temporal lobe infarct embolic secondary to known atrial fibrillation  MRI  Right posterior temporal lobe infarct. Diffuse intracranial stenotic changes  MRA  Moderate to high-grade stenosis distal left M1. Moderate  bilateral PCA and distal bilateral A1 segment stenoses  Carotid Doppler  pending  2D Echo  pending  LDL 112  HgbA1c 5.4  SCDs for VTE prophylaxis  Diet Heart Room service appropriate?: Yes; Fluid consistency:: Thin  aspirin 81 mg daily prior to admission, now on aspirin 325 mg daily and clopidogrel 75 mg daily. Return to aspirin 81 mg as prior to admission.  Ongoing aggressive stroke risk factor management  Therapy recommendations:  Outpatient OT  Disposition:  Pending, anticipate return home  Atrial Fibrillation with RVR  Home anticoagulation:  none  Not on anticoagulation prior to admission due to thrombocytopenia. Taken off Xarelto in 2013 due to thrombocytopenia.  Hypertension  Elevated Permissive hypertension (OK if < 220/120) but gradually normalize in 5-7 days  Hyperlipidemia  Home meds:  No statin  LDL 112, goal < 70  Crestor 20 mg added  Continue statin at discharge  Other Stroke Risk Factors  Cocaine use, urine drug screen positive this admission  Cigarette smoker, advised to stop smoking  History of alcohol abuse, decreased intake at this time, confirmed by wife  Marijuana use, urine drug screen positive on admission  PAD Status post right leg stent placement  Chronic diastolic heart failure  Carotid artery stenosis status post carotid artery angioplasty  Other Active Problems  Hepatitis C  Thrombocytopenia, Platelet 95. PER HISTORY, PATIENT WITH THROMBOCYTOPENIA ON ADMISSION 04/30/2012 OUT TO BE SECONDARY TO xARELTO. pLATELETS NOT IMPROVED IN 2015.  Liver cirrhosis secondary to alcohol and hepatitis C, compensated.   Chronic kidney disease stage III  Hospital day # 0  Rhoderick Moody [facility] See Amion for Pager information 08/24/2015 7:06 PM  I have personally examined this patient, reviewed notes, independently viewed imaging studies, participated in medical decision making and plan of care. I have made any additions  or clarifications directly to the above note. Agree with note above. He presented with transient speech and vision difficulties due to a small embolic right temporal infarct secondary to atrial fibrillation. Patient remains at risk for recurrent stroke, TIA, neurological worsening but he may not be a good long-term candidate for anticoagulation given history of thrombocytopenia, alcohol abuse and liver cirrhosis and kidney disease.  Delia Heady, MD Medical Director [facility] Pager: [phone] 08/24/2015 8:07 PM    To contact Stroke Continuity provider, please refer to [email or web address]. After hours, contact General Neurology

## 2015-08-24 NOTE — Progress Notes (Signed)
PATIENT DETAILS Name: QUINTEN ALLERTON Age: 64 y.o. Sex: male Date of Birth: 07-18-1951 Admit Date: 08/23/2015 Admitting Physician Costin Otelia Sergeant, MD ZOX:[id number], MD  Subjective: Speech mildly dysarthric-but non-focal exam. Claims he drinks 40 oz beer daily, cocaine every 1-2 weeks, and marijuana regularly as well.  Assessment/Plan: Active Problems: Acute CVA: Dysarthria has improved-only mild/minimal dysarthria present. Nonfocal exam. MRI brain shows acute posterior temporal lobe infarct. MRA brain shows significant intracranial disease. Echo/carotids pending. LDL 112 (goal<70) start Crestor. A1c pending. Has history of atrial fibrillation-but given thrombocytopenia/polysubstance abuse -poor long-term candidate for anticoagulation. Await input from neurology.   Atrial fibrillation with RVR: Heart rate mildly elevated this morning, start Cardizem 60 mg 3 times a day. See above regarding anticoagulation.  Thrombocytopenia: Suspect secondary to cirrhosis. Chronic issue-stable for outpatient monitoring  Hypertension: Although permissive hypertension-started Cardizem for rate control for A. fib.  Cirrhosis: Likely secondary to hep C/alcohol. Compensated at present  Alcohol abuse: Although has cut down significantly-still consumes approximately 40 ounces of beer on a daily basis. No signs of withdrawal, follow.  Polysubstance abuse: Claims to regularly use cocaine and marijuana as well. Counseled extensively.  CKD stage III: Creatinine close to usual baseline. Follow  Hypokalemia: Replete and recheck  History of medication noncompliance: Counseled  PAD (peripheral artery disease), PTA to Lt. SFA in 2009, total occlusion mid Rt SFA with collaterals     Disposition: Remain inpatient  Antimicrobial agents  See below  Anti-infectives    None      DVT Prophylaxis:  SCD's  Code Status: Full code   Family  Communication None  Procedures: None  CONSULTS:  neurology  Time spent 40 minutes-Greater than 50% of this time was spent in counseling, explanation of diagnosis, planning of further management, and coordination of care.  MEDICATIONS: Scheduled Meds: . aspirin  300 mg Rectal Daily   Or  . aspirin  325 mg Oral Daily  . clopidogrel  75 mg Oral Daily  . diltiazem  60 mg Oral 3 times per day   Continuous Infusions:  PRN Meds:.hydrALAZINE, senna-docusate    PHYSICAL EXAM: Vital signs in last 24 hours: Filed Vitals:   08/24/15 0905 08/24/15 0906 08/24/15 1011 08/24/15 1021  BP: 162/121   190/87  Pulse: 91 110 93 102  Temp:      TempSrc:      Resp:      SpO2:    99%    Weight change:  There were no vitals filed for this visit. There is no weight on file to calculate BMI.   Gen Exam: Awake and alert with slight dysarthria   Neck: Supple, No JVD.   Chest: B/L Clear.   CVS: S1 S2 Regular, no murmurs.  Abdomen: soft, BS +, non tender, non distended.  Extremities: no edema, lower extremities warm to touch. Neurologic: Non Focal.   Skin: No Rash.   Wounds: N/A.   Intake/Output from previous day:  Intake/Output Summary (Last 24 hours) at 08/24/15 1232 Last data filed at 08/24/15 0524  Gross per 24 hour  Intake    240 ml  Output   2990 ml  Net  -2750 ml     LAB RESULTS: CBC  Recent Labs Lab 08/23/15 1619 08/23/15 1630  WBC 7.3  --   HGB 14.2 16.0  HCT 41.6 47.0  PLT 95*  --   MCV 99.5  --   MCH 34.0  --  MCHC 34.1  --   RDW 12.5  --   LYMPHSABS 0.9  --   MONOABS 0.6  --   EOSABS 0.1  --   BASOSABS 0.0  --     Chemistries   Recent Labs Lab 08/23/15 1619 08/23/15 1630 08/24/15 0530  NA 139 141 136  K 3.8 3.7 3.3*  CL 105 102 104  CO2 27  --  24  GLUCOSE 94 95 104*  BUN 16 21* 11  CREATININE 1.41* 1.50* 1.16  CALCIUM 9.0  --  8.7*    CBG: No results for input(s): GLUCAP in the last 168 hours.  GFR CrCl cannot be calculated  (Unknown ideal weight.).  Coagulation profile  Recent Labs Lab 08/23/15 1619 08/24/15 0530  INR 1.05 1.03    Cardiac Enzymes No results for input(s): CKMB, TROPONINI, MYOGLOBIN in the last 168 hours.  Invalid input(s): CK  Invalid input(s): POCBNP No results for input(s): DDIMER in the last 72 hours. No results for input(s): HGBA1C in the last 72 hours.  Recent Labs  08/24/15 0530  CHOL 182  HDL 57  LDLCALC 112*  TRIG 64  CHOLHDL 3.2   No results for input(s): TSH, T4TOTAL, T3FREE, THYROIDAB in the last 72 hours.  Invalid input(s): FREET3 No results for input(s): VITAMINB12, FOLATE, FERRITIN, TIBC, IRON, RETICCTPCT in the last 72 hours. No results for input(s): LIPASE, AMYLASE in the last 72 hours.  Urine Studies No results for input(s): UHGB, CRYS in the last 72 hours.  Invalid input(s): UACOL, UAPR, USPG, UPH, UTP, UGL, UKET, UBIL, UNIT, UROB, ULEU, UEPI, UWBC, URBC, UBAC, CAST, UCOM, BILUA  MICROBIOLOGY: No results found for this or any previous visit (from the past 240 hour(s)).  RADIOLOGY STUDIES/RESULTS: Ct Head Wo Contrast  08/23/2015  CLINICAL DATA:  Slurred speech. EXAM: CT HEAD WITHOUT CONTRAST TECHNIQUE: Contiguous axial images were obtained from the base of the skull through the vertex without intravenous contrast. COMPARISON:  None. FINDINGS: There are postsurgical changes from a small craniectomy in the right parietal calvarium. There is a small well corticated focal defect in the medial left parietal calvarium, reportedly the sequela of childhood injury per discussion with the ordering provider. No evidence of parenchymal hemorrhage or extra-axial fluid collection. No mass lesion, mass effect, or midline shift. No CT evidence of acute infarction. There is intracranial atherosclerosis. Nonspecific subcortical and periventricular white matter hypodensity, most in keeping with chronic small vessel ischemic change. Cerebral volume is age appropriate. No  ventriculomegaly. The visualized paranasal sinuses are essentially clear. The mastoid air cells are unopacified. No evidence of calvarial fracture. IMPRESSION: 1.  No evidence of acute intracranial abnormality. 2. Intracranial atherosclerosis and chronic small vessel ischemic white matter change. These results were called by telephone at the time of interpretation on 08/23/2015 at 4:22 pm to Dr. Lavon Paganini, who verbally acknowledged these results. Electronically Signed   By: Delbert Phenix M.D.   On: 08/23/2015 16:29   Mr Brain Wo Contrast  08/23/2015  CLINICAL DATA:  Episode of slurred speech and blurred vision this afternoon. EXAM: MRI HEAD WITHOUT CONTRAST MRA HEAD WITHOUT CONTRAST TECHNIQUE: Multiplanar, multiecho pulse sequences of the brain and surrounding structures were obtained without intravenous contrast. Angiographic images of the head were obtained using MRA technique without contrast. COMPARISON:  CT of the head without contrast from the same day. FINDINGS: MRI HEAD FINDINGS The diffusion-weighted images demonstrate an acute nonhemorrhagic infarct in the posterior right temporal lobe. A punctate subcortical white matter infarct in the anterior  right frontal lobe is also suspected. No acute infarct is evident. Moderate atrophy and diffuse white matter changes are present bilaterally. Remote lacunar infarcts are present in the thalami and basal ganglia bilaterally. The brainstem is within normal limits. Mild cerebellar atrophy is present. Flow is present in the major intracranial arteries. The globes and orbits are intact. The paranasal sinuses and mastoid air cells are clear. MRA HEAD FINDINGS Mild atherosclerotic irregularity is present throughout the cavernous internal carotid arteries bilaterally without significant stenosis. There is focal signal loss in the distal A1 segments bilaterally suggesting moderate stenosis. Additional left greater than right distal ACA stenoses are present. There is a  high-grade stenosis in the distal left M1 segment. Mild narrowing is present in the distal right M1 segment. There is significant attenuation of MCA branch vessels bilaterally, likely exaggerated by significant patient motion. The vertebral arteries are codominant. The PICA origins are not visualized. The basilar artery is intact. Both posterior cerebral arteries originate from the basilar tip. Mild to moderate proximal PCA stenoses are evident bilaterally. IMPRESSION: 1. Acute nonhemorrhagic posterior temporal lobe infarct. 2. Moderate atrophy and advanced confluent periventricular white matter disease suggesting chronic microvascular ischemia. 3. Diffuse intracranial atherosclerotic changes. 4. Moderate stenoses of the distal A1 segments bilaterally. 5. Moderate to high-grade stenosis of the distal left M1 segment. 6. Mild to moderate proximal PCA stenoses bilaterally. These results will be called to the ordering clinician or representative by the Radiologist Assistant, and communication documented in the PACS or zVision Dashboard. Electronically Signed   By: Marin Roberts M.D.   On: 08/23/2015 19:11   Mr Maxine Glenn Head/brain Wo Cm  08/23/2015  CLINICAL DATA:  Episode of slurred speech and blurred vision this afternoon. EXAM: MRI HEAD WITHOUT CONTRAST MRA HEAD WITHOUT CONTRAST TECHNIQUE: Multiplanar, multiecho pulse sequences of the brain and surrounding structures were obtained without intravenous contrast. Angiographic images of the head were obtained using MRA technique without contrast. COMPARISON:  CT of the head without contrast from the same day. FINDINGS: MRI HEAD FINDINGS The diffusion-weighted images demonstrate an acute nonhemorrhagic infarct in the posterior right temporal lobe. A punctate subcortical white matter infarct in the anterior right frontal lobe is also suspected. No acute infarct is evident. Moderate atrophy and diffuse white matter changes are present bilaterally. Remote lacunar  infarcts are present in the thalami and basal ganglia bilaterally. The brainstem is within normal limits. Mild cerebellar atrophy is present. Flow is present in the major intracranial arteries. The globes and orbits are intact. The paranasal sinuses and mastoid air cells are clear. MRA HEAD FINDINGS Mild atherosclerotic irregularity is present throughout the cavernous internal carotid arteries bilaterally without significant stenosis. There is focal signal loss in the distal A1 segments bilaterally suggesting moderate stenosis. Additional left greater than right distal ACA stenoses are present. There is a high-grade stenosis in the distal left M1 segment. Mild narrowing is present in the distal right M1 segment. There is significant attenuation of MCA branch vessels bilaterally, likely exaggerated by significant patient motion. The vertebral arteries are codominant. The PICA origins are not visualized. The basilar artery is intact. Both posterior cerebral arteries originate from the basilar tip. Mild to moderate proximal PCA stenoses are evident bilaterally. IMPRESSION: 1. Acute nonhemorrhagic posterior temporal lobe infarct. 2. Moderate atrophy and advanced confluent periventricular white matter disease suggesting chronic microvascular ischemia. 3. Diffuse intracranial atherosclerotic changes. 4. Moderate stenoses of the distal A1 segments bilaterally. 5. Moderate to high-grade stenosis of the distal left M1 segment. 6. Mild  to moderate proximal PCA stenoses bilaterally. These results will be called to the ordering clinician or representative by the Radiologist Assistant, and communication documented in the PACS or zVision Dashboard. Electronically Signed   By: Marin Roberts M.D.   On: 08/23/2015 19:11    Jeoffrey Massed, MD  [facility] Pager:[phone]  If 7PM-7AM, please contact night-coverage [email or web address] Password [facility] 08/24/2015, 12:32 PM

## 2015-08-24 NOTE — Evaluation (Signed)
Physical Therapy Evaluation Patient Details Name: Kevin [name] MRN: [id number] DOB: 04/26/1951 Today's Date: 08/24/2015   History of Present Illness  Patient is a 64 y/o male presents with slurred speech and visual changes. Admitted CODE stroke. MRI-Acute nonhemorrhagic posterior temporal lobe infarct.  PMH includes HTN, HLD, Hep C, polysubstance abuse, cirrhosis, and medical non-compliance.   Clinical Impression  Patient presents with balance deficits and weakness s/p above impacting safe mobility. Pt with elevated BP and HR during activity. See below. Might benefit from use of [facility] for more stability during ambulation. Will attempt next session. Pt needs to be able to negotiate 1 flight of steps to get to bedroom and is home alone during the day. Will follow acutely to maximize independence and mobility prior to return home.    Follow Up Recommendations Other (comment);Supervision - Intermittent (follow up TBD- might need OPPT pending progress)    Equipment Recommendations  Other (comment) (TBD)    Recommendations for Other Services OT consult     Precautions / Restrictions Precautions Precautions: Fall Precaution Comments: elevated BP, HR Restrictions Weight Bearing Restrictions: No      Mobility  Bed Mobility Overal bed mobility: Modified Independent                Transfers Overall transfer level: Needs assistance Equipment used: None Transfers: Sit to/from Stand Sit to Stand: Supervision         General transfer comment: Supervision for safety. Stood from [employer]. Transferred to chair post ambulation bout.  Ambulation/Gait Ambulation/Gait assistance: Min guard Ambulation Distance (Feet): 120 Feet Assistive device: None Gait Pattern/deviations: Step-through pattern;Decreased stride length;Drifts right/left   Gait velocity interpretation: Below normal speed for age/gender General Gait Details: Mildly unsteady gait wtih drifting noted. pt reports bil  ankles "feel like they are going to give out." HR increased to 156 bpm during ambulation in A-fib. DOE.  Stairs            Wheelchair Mobility    Modified Rankin (Stroke Patients Only) Modified Rankin (Stroke Patients Only) Pre-Morbid Rankin Score: Slight disability Modified Rankin: Moderately severe disability     Balance Overall balance assessment: Needs assistance Sitting-balance support: Feet supported;No upper extremity supported Sitting balance-Leahy Scale: Good     Standing balance support: During functional activity Standing balance-Leahy Scale: Fair Standing balance comment: Tolerated urination in standing without UE support.                              Pertinent Vitals/Pain Pain Assessment: Faces Faces Pain Scale: Hurts little more Pain Location: headache Pain Descriptors / Indicators: Headache Pain Intervention(s): Monitored during session;Repositioned;Patient requesting pain meds-RN notified  Sitting BP 198/93   Post ambulation BP 192/118  HR ranged from 115-156 bpm    Home Living Family/patient expects to be discharged to:: Private residence Living Arrangements: Spouse/significant other Available Help at Discharge: Family (Wife works at [employer] during the day) Type of Home: Apartment Home Access: Stairs to enter   [employer] of Steps: 1 Home Layout: Two level;1/2 bath on main level;Bed/bath upstairs Home Equipment: None      Prior Function Level of Independence: Independent               Hand Dominance   Dominant Hand: Left    Extremity/Trunk Assessment   Upper Extremity Assessment: Defer to OT evaluation           Lower Extremity Assessment: Overall WFL for  tasks assessed         Communication   Communication: No difficulties  Cognition Arousal/Alertness: Awake/alert Behavior During Therapy: WFL for tasks assessed/performed Overall Cognitive Status: Within Functional Limits for tasks  assessed                      General Comments General comments (skin integrity, edema, etc.): Reports issues with peripheral vision bilaterally - says this is not new. Wears glasses.     Exercises        Assessment/Plan    PT Assessment Patient needs continued PT services  PT Diagnosis Difficulty walking   PT Problem List Decreased strength;Decreased balance;Decreased mobility;Cardiopulmonary status limiting activity;Pain;Decreased activity tolerance  PT Treatment Interventions Balance training;Gait training;Stair training;Functional mobility training;Therapeutic activities;Therapeutic exercise;Patient/family education   PT Goals (Current goals can be found in the Care Plan section) Acute Rehab PT Goals Patient Stated Goal: to return to independence PT Goal Formulation: With patient Time For Goal Achievement: 09/07/15 Potential to Achieve Goals: Good    Frequency Min 4X/week   Barriers to discharge Decreased caregiver support;Inaccessible home environment by himself during the day and 1 flight of steps to get to bedroom    Co-evaluation               End of Session Equipment Utilized During Treatment: Gait belt Activity Tolerance: Patient tolerated treatment well Patient left: in chair;with call bell/phone within reach;with chair alarm set Nurse Communication: Mobility status    Functional Assessment Tool Used: Clinical judgment Functional Limitation: Mobility: Walking and moving around Mobility: Walking and Moving Around Current Status (C1660): At least 1 percent but less than 20 percent impaired, limited or restricted Mobility: Walking and Moving Around Goal Status [phone]): At least 1 percent but less than 20 percent impaired, limited or restricted    Time: 0926-0950 PT Time Calculation (min) (ACUTE ONLY): 24 min   Charges:   PT Evaluation $Initial PT Evaluation Tier I: 1 Procedure PT Treatments $Gait Training: 8-22 mins   PT G Codes:   PT  G-Codes **NOT FOR INPATIENT CLASS** Functional Assessment Tool Used: Clinical judgment Functional Limitation: Mobility: Walking and moving around Mobility: Walking and Moving Around Current Status (W1093): At least 1 percent but less than 20 percent impaired, limited or restricted Mobility: Walking and Moving Around Goal Status [phone]): At least 1 percent but less than 20 percent impaired, limited or restricted    Brooks Stotz A Alexzandra Bilton 08/24/2015, 9:59 AM Mylo Red, PT, DPT [phone]

## 2015-08-24 NOTE — Progress Notes (Addendum)
Occupational Therapy Evaluation Patient Details Name: Kevin [name] MRN: [id number] DOB: [birth year] Today's Date: 08/24/2015    History of Present Illness Patient is a 64 y/o male presents with slurred speech and visual changes. Admitted CODE stroke. MRI-Acute nonhemorrhagic posterior temporal lobe infarct.  PMH includes HTN, HLD, Hep C, polysubstance abuse, cirrhosis, and medical non-compliance.    Clinical Impression   PTA, pt independent with ADL and mobility. Pt presents with apparent visual and cognitive deficits. Pt appear to demonstrate a L inattention during extinction assessment. (?residual from previous R parietal involvement/crani). Recommend pt follow up with OT at the neuro outpt center and have 24/7 S initially upon return home. Wife states son can stay with him while she is at work. Also recommend pt refrain from driving  At this time. Discussed with pt/wife and they verbalized understanding.     Follow Up Recommendations  Neuro Outpatient OT;Supervision/Assistance - 24 hour    Equipment Recommendations  3 in 1 bedside comode (to use as shower chair)    Recommendations for Other Services       Precautions / Restrictions Precautions Precautions: Fall Restrictions Weight Bearing Restrictions: No      Mobility Bed Mobility Overal bed mobility: Modified Independent                Transfers Overall transfer level: Needs assistance Equipment used: 1 person hand held assist Transfers: Sit to/from Stand;Stand Pivot Transfers Sit to Stand: Supervision Stand pivot transfers: Min guard            Balance     Sitting balance-Leahy Scale: Good       Standing balance-Leahy Scale: Fair                              ADL Overall ADL's : Needs assistance/impaired     Grooming: Min guard;Standing   Upper Body Bathing: Set up   Lower Body Bathing: Supervison/ safety;Set up   Upper Body Dressing : Supervision/safety;Set up;Sitting    Lower Body Dressing: Supervision/safety;Set up   Toilet Transfer: Min guard   Toileting- Clothing Manipulation and Hygiene: Supervision/safety       Functional mobility during ADLs: Min guard General ADL Comments: Appears unsteadyat times. Delayed processing with ADL tasks, Difficulty with reading menu     Vision Vision Assessment?: Vision impaired- to be further tested in functional context Additional Comments: ? R superior field cut.  (Misses 10/10 targets with extinction -? from previous injury)  Pt asked to read items on menu. Would not fully scan to L. Will further assess.    Perception  appears impaired   Praxis Praxis Praxis tested?: Deficits Deficits: Organization    Pertinent Vitals/Pain Pain Assessment: No/denies pain     Hand Dominance Left   Extremity/Trunk Assessment Upper Extremity Assessment Upper Extremity Assessment: Overall WFL for tasks assessed   Lower Extremity Assessment Lower Extremity Assessment: Defer to PT evaluation   Cervical / Trunk Assessment Cervical / Trunk Assessment: Normal   Communication Communication Communication: No difficulties   Cognition Arousal/Alertness: Awake/alert Behavior During Therapy: WFL for tasks assessed/performed Overall Cognitive Status: Impaired/Different from baseline Area of Impairment: Attention;Memory;Awareness;Problem solving   Current Attention Level: Selective Memory: Decreased short-term memory     Awareness: Emergent Problem Solving: Slow processing;Difficulty sequencing General Comments: wife state he is not at his baseline   General Comments       Exercises       Shoulder Instructions  Home Living Family/patient expects to be discharged to:: Private residence Living Arrangements: Spouse/significant other Available Help at Discharge: Family (Wife works at [employer] during the day) Type of Home: Apartment Home Access: Stairs to enter [employer] of Steps: 1    Home Layout: Two level;1/2 bath on main level;Bed/bath upstairs Alternate Level Stairs-Number of Steps: 1 flight Alternate Level Stairs-Rails: Right Bathroom Shower/Tub: Tub/shower unit Shower/tub characteristics: Engineer, building services: Standard Bathroom Accessibility: Yes How Accessible: Accessible via walker Home Equipment: None          Prior Functioning/Environment Level of Independence: Independent        Comments: driving    OT Diagnosis: Generalized weakness;Cognitive deficits;Disturbance of vision   OT Problem List: Impaired balance (sitting and/or standing);Decreased cognition;Impaired vision/perception;Decreased safety awareness;Decreased knowledge of use of DME or AE   OT Treatment/Interventions: Self-care/ADL training;Therapeutic exercise;DME and/or AE instruction;Therapeutic activities;Patient/family education;Visual/perceptual remediation/compensation;Cognitive remediation/compensation;Balance training    OT Goals(Current goals can be found in the care plan section) Acute Rehab OT Goals Patient Stated Goal: to return to independence OT Goal Formulation: With patient/family Time For Goal Achievement: 09/07/15 Potential to Achieve Goals: Good ADL Goals Pt Will Transfer to Toilet: with supervision;ambulating;bedside commode (with caregiver independent with assisting) Pt Will Perform Tub/Shower Transfer: 3 in 1;Tub transfer;with caregiver independent in assisting;ambulating;with supervision Additional ADL Goal #1: Pt/caregiver verbalize understanding of use of anchors to assist with identifying visual field.  OT Frequency: Min 2X/week   Barriers to D/C:            Co-evaluation              End of Session Equipment Utilized During Treatment: Gait belt Nurse Communication: Mobility status  Activity Tolerance: Patient tolerated treatment well Patient left: in bed;with call bell/phone within reach;with bed alarm set;with family/visitor present    Time: 6962-9528 OT Time Calculation (min): 23 min Charges:  OT General Charges $OT Visit: 1 Procedure OT Evaluation $Initial OT Evaluation Tier I: 1 Procedure OT Treatments $Self Care/Home Management : 8-22 mins G-Codes:    Para Cossey,HILLARY 09/19/2015, 3:48 PM   [facility], OTR/L  [phone] 09-19-2015

## 2015-08-24 NOTE — Progress Notes (Addendum)
Patient's HR sustaining in the 120s-130s. Dr. Jerral Ralph made aware of HR and blood pressure overnight and orders placed.

## 2015-08-25 ENCOUNTER — Inpatient Hospital Stay (HOSPITAL_COMMUNITY): Payer: BLUE CROSS/BLUE SHIELD

## 2015-08-25 DIAGNOSIS — I481 Persistent atrial fibrillation: Secondary | ICD-10-CM

## 2015-08-25 DIAGNOSIS — I6789 Other cerebrovascular disease: Secondary | ICD-10-CM

## 2015-08-25 LAB — HEMOGLOBIN A1C
Hgb A1c MFr Bld: 5.1 % (ref 4.8–5.6)
Mean Plasma Glucose: 100 mg/dL

## 2015-08-25 MED ORDER — ROSUVASTATIN CALCIUM 20 MG PO TABS: 20.0000 mg | ORAL_TABLET | Freq: Every day | ORAL | Status: DC

## 2015-08-25 MED ORDER — ONDANSETRON HCL 4 MG/2ML IJ SOLN: 4.0000 mg | Freq: Three times a day (TID) | INTRAMUSCULAR | Status: AC | PRN

## 2015-08-25 MED ORDER — ISOSORBIDE MONONITRATE ER 30 MG PO TB24
30.0000 mg | ORAL_TABLET | Freq: Every day | ORAL | Status: DC
  Administered 2015-08-25 – 2015-08-27 (×3): 30 mg via ORAL
  Filled 2015-08-25 (×3): qty 1

## 2015-08-25 MED ORDER — DILTIAZEM HCL ER COATED BEADS 240 MG PO CP24: 240.0000 mg | ORAL_CAPSULE | Freq: Every day | ORAL | Status: DC

## 2015-08-25 MED ORDER — ISOSORBIDE MONONITRATE ER 30 MG PO TB24: 30.0000 mg | ORAL_TABLET | Freq: Every day | ORAL | Status: DC

## 2015-08-25 NOTE — Progress Notes (Signed)
OT Cancellation Note  Patient Details Name: Kevin [name] MRN: [id number] DOB: 13-Jul-1951   Cancelled Treatment:    Reason Eval/Treat Not Completed: Patient at procedure or test/ unavailable. Pt. Having ECHO performed.  Will check back as able.  Robet Leu, COTA/L 08/25/2015, 10:18 AM

## 2015-08-25 NOTE — Progress Notes (Signed)
Echocardiogram 2D Echocardiogram has been performed.  Kevin [name] 08/25/2015, 10:38 AM

## 2015-08-25 NOTE — Progress Notes (Signed)
Called Echo, Patient is having his ECHO completed right now and they will also do the carotid.  Patient will possibly be discharged today after those tests

## 2015-08-25 NOTE — Progress Notes (Signed)
Pharmacist Provided - Patient Medication Education   Kevin [name] is an 64 y.o. male who presented to [facility] on 08/23/2015 with a chief complaint of  Chief Complaint  Patient presents with  . Code Stroke  . Aphasia  . Blurred Vision     [x]  Patient has 1 new medication: Crestor  Assessment: Patient was recently started on Crestor. Provided patient with education on this new medication (indication, SE). Answered all of the patients questions about this medication.  Time spent preparing for discharge counseling: Time spent counseling patient:  Remi Haggard, PharmD 08/25/2015, 6:04 PM

## 2015-08-25 NOTE — Progress Notes (Signed)
Physical Therapy Treatment Patient Details Name: Kevin [name] MRN: [id number] DOB: Aug 02, 1951 Today's Date: 08/25/2015    History of Present Illness Patient is a 64 y/o male presents with slurred speech and visual changes. Admitted CODE stroke. MRI-Acute nonhemorrhagic posterior temporal lobe infarct.  PMH includes HTN, HLD, Hep C, polysubstance abuse, cirrhosis, and medical non-compliance.     PT Comments    Patient's balance seems improved today from prior session however continues to bump into walls/objects on left side. Discussed compensatory techniques and to be vigilant about attending to left side. Pt reports being aware of this? Education re: only doing stairs with family members present for safety. Would really benefit from neuro OPPT to improve safety, balance and mobility. Per OT notes, pt's son will be able to stay with him while wife is working. Will follow acutely.   Follow Up Recommendations  Supervision for mobility/OOB;Outpatient PT (neuro)     Equipment Recommendations  None recommended by PT    Recommendations for Other Services       Precautions / Restrictions Precautions Precautions: Fall Restrictions Weight Bearing Restrictions: No    Mobility  Bed Mobility Overal bed mobility: Modified Independent                Transfers Overall transfer level: Needs assistance Equipment used: None Transfers: Sit to/from Stand Sit to Stand: Supervision         General transfer comment: Supervision for safety. Stood from [employer].   Ambulation/Gait Ambulation/Gait assistance: Min guard Ambulation Distance (Feet): 200 Feet Assistive device: None Gait Pattern/deviations: Step-through pattern;Decreased stride length;Drifts right/left;Staggering right;Staggering left   Gait velocity interpretation: Below normal speed for age/gender General Gait Details: Mildly unsteady at times with occasionally bumping into objects on the left. DOE. Cues for safety.     Stairs Stairs: Yes Stairs assistance: Supervision Stair Management: Step to pattern;Forwards;One rail Right Number of Stairs: 13 General stair comments: Cues for step to pattern to negotiate steps due to mild weakness LLE. Pt with almost LOB descending steps.  Wheelchair Mobility    Modified Rankin (Stroke Patients Only) Modified Rankin (Stroke Patients Only) Pre-Morbid Rankin Score: Slight disability Modified Rankin: Moderately severe disability     Balance Overall balance assessment: Needs assistance Sitting-balance support: Feet supported;No upper extremity supported Sitting balance-Leahy Scale: Good     Standing balance support: During functional activity Standing balance-Leahy Scale: Fair                      Cognition Arousal/Alertness: Awake/alert Behavior During Therapy: WFL for tasks assessed/performed Overall Cognitive Status: No family/caregiver present to determine baseline cognitive functioning                      Exercises      General Comments General comments (skin integrity, edema, etc.): Discussed compensatory techniques for attending to left side, slowing increasing activity level at home and being aware of fatigue esp with left side.       Pertinent Vitals/Pain Pain Assessment: No/denies pain    Home Living                      Prior Function            PT Goals (current goals can now be found in the care plan section) Progress towards PT goals: Progressing toward goals    Frequency  Min 4X/week    PT Plan Current plan remains appropriate    Co-evaluation  End of Session Equipment Utilized During Treatment: Gait belt Activity Tolerance: Patient tolerated treatment well Patient left: in bed;with call bell/phone within reach (sitting EOB.)     Time: 1610-9604 PT Time Calculation (min) (ACUTE ONLY): 20 min  Charges:  $Gait Training: 8-22 mins                    G Codes:       Rebie Peale A Yuvan Medinger 08/25/2015, 11:45 AM Mylo Red, PT, DPT [phone]

## 2015-08-25 NOTE — Progress Notes (Signed)
PATIENT DETAILS Name: Kevin [name] Age: 64 y.o. Sex: male Date of Birth: Jul 10, 1951 Admit Date: 08/23/2015 Admitting Physician Costin Otelia Sergeant, MD FBP:[id number], MD  Subjective: Speech mildly dysarthric-feels much improved today-does not have any major complaints  Assessment/Plan: Active Problems: Acute CVA: Dysarthria has improved-only mild/minimal dysarthria present. Nonfocal exam. MRI brain shows acute posterior temporal lobe infarct. MRA brain shows significant intracranial disease. Echo/carotids pending. LDL 112 (goal<70) started Crestor. A1c 5.1. Has history of atrial fibrillation-but given thrombocytopenia/polysubstance abuse - remains a poor long-term candidate for anticoagulation-this was discussed with patient and spouse over the phone today.They understand difficult situation-in that the patient would always remain at risk of recurrent CVAs in the future. Neurology also recommending to continue with aspirin on discharge.  Atrial fibrillation with RVR:  much improved, continue Cardizem. See above regarding anticoagulation (Note:Taken off Xarelto in 2013 due to thrombocytopenia)  Thrombocytopenia: Suspect secondary to cirrhosis. Chronic issue-stable for outpatient monitoring  Hypertension: Allow permissive hypertension-continue Cardizem-resume Imdur.follow and titrate accordingly   Cirrhosis: Likely secondary to hep C/alcohol. Compensated at present  Alcohol abuse: Although has cut down significantly-still consumes approximately 40 ounces of beer on a daily basis. No signs of withdrawal, follow.  Polysubstance abuse: Claims to regularly use cocaine and marijuana as well.  UDS positive for cocaine and cannabinoids. Counseled extensively.  CKD stage III: Creatinine close to usual baseline. Follow  Hypokalemia: Repleted.  History of medication noncompliance: Counseled  PAD (peripheral artery disease), PTA to Lt. SFA in 2009, total occlusion mid  Rt SFA with collaterals     Disposition: Remain inpatient-home when workup complete   Antimicrobial agents  See below  Anti-infectives    None      DVT Prophylaxis:  SCD's  Code Status: Full code   Family Communication None  Procedures: None  CONSULTS:  neurology  Time spent 30 minutes-Greater than 50% of this time was spent in counseling, explanation of diagnosis, planning of further management, and coordination of care.  MEDICATIONS: Scheduled Meds: . aspirin EC  81 mg Oral Daily  . diltiazem  60 mg Oral 3 times per day  . isosorbide mononitrate  30 mg Oral Daily  . rosuvastatin  20 mg Oral Daily   Continuous Infusions:  PRN Meds:.hydrALAZINE, ondansetron (ZOFRAN) IV, senna-docusate    PHYSICAL EXAM: Vital signs in last 24 hours: Filed Vitals:   08/25/15 0332 08/25/15 0514 08/25/15 0627 08/25/15 1305  BP: 187/98 180/98 182/92 151/115  Pulse: 78 58 95 87  Temp: 98.6 F (37 C) 97.9 F (36.6 C) 98.2 F (36.8 C) 98.6 F (37 C)  TempSrc: Oral Oral Oral Oral  Resp: 13 16 14 18   Height:      Weight:      SpO2: 98% 100% 100% 100%    Weight change:  Filed Weights   08/24/15 1700  Weight: 90.2 kg (198 lb 13.7 oz)   Body mass index is 22.98 kg/(m^2).   Gen Exam: Awake and alert with slight dysarthria   Neck: Supple, No JVD.   Chest: B/L Clear.   CVS: S1 S2 Regular, no murmurs.  Abdomen: soft, BS +, non tender, non distended.  Extremities: no edema, lower extremities warm to touch. Neurologic: Non Focal.   Skin: No Rash.   Wounds: N/A.   Intake/Output from previous day:  Intake/Output Summary (Last 24 hours) at 08/25/15 1458 Last data filed at 08/25/15 1300  Gross per 24 hour  Intake    600 ml  Output    380 ml  Net    220 ml     LAB RESULTS: CBC  Recent Labs Lab 08/23/15 1619 08/23/15 1630  WBC 7.3  --   HGB 14.2 16.0  HCT 41.6 47.0  PLT 95*  --   MCV 99.5  --   MCH 34.0  --   MCHC 34.1  --   RDW 12.5  --   LYMPHSABS 0.9   --   MONOABS 0.6  --   EOSABS 0.1  --   BASOSABS 0.0  --     Chemistries   Recent Labs Lab 08/23/15 1619 08/23/15 1630 08/24/15 0530  NA 139 141 136  K 3.8 3.7 3.3*  CL 105 102 104  CO2 27  --  24  GLUCOSE 94 95 104*  BUN 16 21* 11  CREATININE 1.41* 1.50* 1.16  CALCIUM 9.0  --  8.7*    CBG: No results for input(s): GLUCAP in the last 168 hours.  GFR Estimated Creatinine Clearance: 83.2 mL/min (by C-G formula based on Cr of 1.16).  Coagulation profile  Recent Labs Lab 08/23/15 1619 08/24/15 0530  INR 1.05 1.03    Cardiac Enzymes No results for input(s): CKMB, TROPONINI, MYOGLOBIN in the last 168 hours.  Invalid input(s): CK  Invalid input(s): POCBNP No results for input(s): DDIMER in the last 72 hours.  Recent Labs  08/24/15 0530  HGBA1C 5.1    Recent Labs  08/24/15 0530  CHOL 182  HDL 57  LDLCALC 112*  TRIG 64  CHOLHDL 3.2   No results for input(s): TSH, T4TOTAL, T3FREE, THYROIDAB in the last 72 hours.  Invalid input(s): FREET3 No results for input(s): VITAMINB12, FOLATE, FERRITIN, TIBC, IRON, RETICCTPCT in the last 72 hours. No results for input(s): LIPASE, AMYLASE in the last 72 hours.  Urine Studies No results for input(s): UHGB, CRYS in the last 72 hours.  Invalid input(s): UACOL, UAPR, USPG, UPH, UTP, UGL, UKET, UBIL, UNIT, UROB, ULEU, UEPI, UWBC, URBC, UBAC, CAST, UCOM, BILUA  MICROBIOLOGY: No results found for this or any previous visit (from the past 240 hour(s)).  RADIOLOGY STUDIES/RESULTS: Ct Head Wo Contrast  08/23/2015  CLINICAL DATA:  Slurred speech. EXAM: CT HEAD WITHOUT CONTRAST TECHNIQUE: Contiguous axial images were obtained from the base of the skull through the vertex without intravenous contrast. COMPARISON:  None. FINDINGS: There are postsurgical changes from a small craniectomy in the right parietal calvarium. There is a small well corticated focal defect in the medial left parietal calvarium, reportedly the sequela  of childhood injury per discussion with the ordering provider. No evidence of parenchymal hemorrhage or extra-axial fluid collection. No mass lesion, mass effect, or midline shift. No CT evidence of acute infarction. There is intracranial atherosclerosis. Nonspecific subcortical and periventricular white matter hypodensity, most in keeping with chronic small vessel ischemic change. Cerebral volume is age appropriate. No ventriculomegaly. The visualized paranasal sinuses are essentially clear. The mastoid air cells are unopacified. No evidence of calvarial fracture. IMPRESSION: 1.  No evidence of acute intracranial abnormality. 2. Intracranial atherosclerosis and chronic small vessel ischemic white matter change. These results were called by telephone at the time of interpretation on 08/23/2015 at 4:22 pm to Dr. Lavon Paganini, who verbally acknowledged these results. Electronically Signed   By: Delbert Phenix M.D.   On: 08/23/2015 16:29   Mr Brain Wo Contrast  08/23/2015  CLINICAL DATA:  Episode of slurred speech and blurred vision this afternoon. EXAM:  MRI HEAD WITHOUT CONTRAST MRA HEAD WITHOUT CONTRAST TECHNIQUE: Multiplanar, multiecho pulse sequences of the brain and surrounding structures were obtained without intravenous contrast. Angiographic images of the head were obtained using MRA technique without contrast. COMPARISON:  CT of the head without contrast from the same day. FINDINGS: MRI HEAD FINDINGS The diffusion-weighted images demonstrate an acute nonhemorrhagic infarct in the posterior right temporal lobe. A punctate subcortical white matter infarct in the anterior right frontal lobe is also suspected. No acute infarct is evident. Moderate atrophy and diffuse white matter changes are present bilaterally. Remote lacunar infarcts are present in the thalami and basal ganglia bilaterally. The brainstem is within normal limits. Mild cerebellar atrophy is present. Flow is present in the major intracranial  arteries. The globes and orbits are intact. The paranasal sinuses and mastoid air cells are clear. MRA HEAD FINDINGS Mild atherosclerotic irregularity is present throughout the cavernous internal carotid arteries bilaterally without significant stenosis. There is focal signal loss in the distal A1 segments bilaterally suggesting moderate stenosis. Additional left greater than right distal ACA stenoses are present. There is a high-grade stenosis in the distal left M1 segment. Mild narrowing is present in the distal right M1 segment. There is significant attenuation of MCA branch vessels bilaterally, likely exaggerated by significant patient motion. The vertebral arteries are codominant. The PICA origins are not visualized. The basilar artery is intact. Both posterior cerebral arteries originate from the basilar tip. Mild to moderate proximal PCA stenoses are evident bilaterally. IMPRESSION: 1. Acute nonhemorrhagic posterior temporal lobe infarct. 2. Moderate atrophy and advanced confluent periventricular white matter disease suggesting chronic microvascular ischemia. 3. Diffuse intracranial atherosclerotic changes. 4. Moderate stenoses of the distal A1 segments bilaterally. 5. Moderate to high-grade stenosis of the distal left M1 segment. 6. Mild to moderate proximal PCA stenoses bilaterally. These results will be called to the ordering clinician or representative by the Radiologist Assistant, and communication documented in the PACS or zVision Dashboard. Electronically Signed   By: Marin Roberts M.D.   On: 08/23/2015 19:11   Mr Maxine Glenn Head/brain Wo Cm  08/23/2015  CLINICAL DATA:  Episode of slurred speech and blurred vision this afternoon. EXAM: MRI HEAD WITHOUT CONTRAST MRA HEAD WITHOUT CONTRAST TECHNIQUE: Multiplanar, multiecho pulse sequences of the brain and surrounding structures were obtained without intravenous contrast. Angiographic images of the head were obtained using MRA technique without  contrast. COMPARISON:  CT of the head without contrast from the same day. FINDINGS: MRI HEAD FINDINGS The diffusion-weighted images demonstrate an acute nonhemorrhagic infarct in the posterior right temporal lobe. A punctate subcortical white matter infarct in the anterior right frontal lobe is also suspected. No acute infarct is evident. Moderate atrophy and diffuse white matter changes are present bilaterally. Remote lacunar infarcts are present in the thalami and basal ganglia bilaterally. The brainstem is within normal limits. Mild cerebellar atrophy is present. Flow is present in the major intracranial arteries. The globes and orbits are intact. The paranasal sinuses and mastoid air cells are clear. MRA HEAD FINDINGS Mild atherosclerotic irregularity is present throughout the cavernous internal carotid arteries bilaterally without significant stenosis. There is focal signal loss in the distal A1 segments bilaterally suggesting moderate stenosis. Additional left greater than right distal ACA stenoses are present. There is a high-grade stenosis in the distal left M1 segment. Mild narrowing is present in the distal right M1 segment. There is significant attenuation of MCA branch vessels bilaterally, likely exaggerated by significant patient motion. The vertebral arteries are codominant. The PICA origins  are not visualized. The basilar artery is intact. Both posterior cerebral arteries originate from the basilar tip. Mild to moderate proximal PCA stenoses are evident bilaterally. IMPRESSION: 1. Acute nonhemorrhagic posterior temporal lobe infarct. 2. Moderate atrophy and advanced confluent periventricular white matter disease suggesting chronic microvascular ischemia. 3. Diffuse intracranial atherosclerotic changes. 4. Moderate stenoses of the distal A1 segments bilaterally. 5. Moderate to high-grade stenosis of the distal left M1 segment. 6. Mild to moderate proximal PCA stenoses bilaterally. These results will be  called to the ordering clinician or representative by the Radiologist Assistant, and communication documented in the PACS or zVision Dashboard. Electronically Signed   By: Marin Roberts M.D.   On: 08/23/2015 19:11    Jeoffrey Massed, MD  [facility] Pager:[phone]  If 7PM-7AM, please contact night-coverage [email or web address] Password [email or web address] 08/25/2015, 2:58 PM   LOS: 1 day

## 2015-08-25 NOTE — Discharge Summary (Addendum)
PATIENT DETAILS Name: Kevin [name] Age: 64 y.o. Sex: male Date of Birth: 02/06/1951 MRN: [id number]. Admitting Physician: Leatha Gilding, MD FMB:[id number], MD  Admit Date: 08/23/2015 Discharge date: 08/27/2015  Recommendations for Outpatient Follow-up:  1. New Medication-Eliquis, crestor 2. Please repeat CBC/BMET at next visit-has CKD and thrombocytopenic (now on Eliquis_ that will need to followed closely  PRIMARY DISCHARGE DIAGNOSIS:  Active Problems:   PAD (peripheral artery disease), PTA to Lt. SFA in 2009, total occlusion mid Rt SFA with collaterals    HTN (hypertension) -Accelerated with End organ involvement   Tobacco abuse   Obesity   Thrombocytopenia on admission 04/30/12, thought to be secondary to Xarelto, Platelets no better 2015   DOE (dyspnea on exertion)   Cirrhosis of liver (HCC)   Persistent atrial fibrillation (HCC)   Chronic diastolic heart failure (HCC)   H/O medication noncompliance   TIA (transient ischemic attack)   CKD (chronic kidney disease) stage 3, GFR 30-59 ml/min   Acute ischemic stroke (HCC)   CVA (cerebral infarction)      PAST MEDICAL HISTORY: Past Medical History  Diagnosis Date  . PAD (peripheral artery disease) (HCC)   . Hypertension   . Cocaine abuse   . ETOH abuse   . Tobacco abuse   . Cholelithiases 05/01/2012  . Dysrhythmia     afib  . CHF (congestive heart failure) (HCC)   . Blood transfusion without reported diagnosis   . Hepatitis C antibody test positive 04/2012  . Claudication (HCC)     bilateral  . Hyperpigmentation   . Thrombocytopenia (HCC)   . Persistent atrial fibrillation (HCC) 08/23/2014    DISCHARGE MEDICATIONS: Current Discharge Medication List    START taking these medications   Details  apixaban (ELIQUIS) 5 MG TABS tablet Take 1 tablet (5 mg total) by mouth 2 (two) times daily. Qty: 120 tablet, Refills: 0    rosuvastatin (CRESTOR) 20 MG tablet Take 1 tablet (20 mg total) by mouth  daily. Qty: 30 tablet, Refills: 0      CONTINUE these medications which have CHANGED   Details  diltiazem (CARDIZEM CD) 240 MG 24 hr capsule Take 1 capsule (240 mg total) by mouth daily. Qty: 90 capsule, Refills: 0    isosorbide mononitrate (IMDUR) 30 MG 24 hr tablet Take 1 tablet (30 mg total) by mouth daily. Qty: 90 tablet, Refills: 0      CONTINUE these medications which have NOT CHANGED   Details  folic acid (FOLVITE) 1 MG tablet Take 1 tablet (1 mg total) by mouth daily. Need appointment before anymore refills Qty: 30 tablet, Refills: 1      STOP taking these medications     aspirin EC 81 MG tablet      carvedilol (COREG) 25 MG tablet         ALLERGIES:  No Known Allergies  BRIEF HPI:  See H&P, Labs, Consult and Test reports for all details in brief, patient  is a 64 y.o. male has a past medical history significant for chronic atrial fibrillation, hypertension, hyperlipidemia, obesity, tobacco abuse brought to the hospital for evaluation of dysarthria  CONSULTATIONS:   neurology  PERTINENT RADIOLOGIC STUDIES: Ct Head Wo Contrast  08/23/2015  CLINICAL DATA:  Slurred speech. EXAM: CT HEAD WITHOUT CONTRAST TECHNIQUE: Contiguous axial images were obtained from the base of the skull through the vertex without intravenous contrast. COMPARISON:  None. FINDINGS: There are postsurgical changes from a small craniectomy in the right parietal calvarium. There is  a small well corticated focal defect in the medial left parietal calvarium, reportedly the sequela of childhood injury per discussion with the ordering provider. No evidence of parenchymal hemorrhage or extra-axial fluid collection. No mass lesion, mass effect, or midline shift. No CT evidence of acute infarction. There is intracranial atherosclerosis. Nonspecific subcortical and periventricular white matter hypodensity, most in keeping with chronic small vessel ischemic change. Cerebral volume is age appropriate. No  ventriculomegaly. The visualized paranasal sinuses are essentially clear. The mastoid air cells are unopacified. No evidence of calvarial fracture. IMPRESSION: 1.  No evidence of acute intracranial abnormality. 2. Intracranial atherosclerosis and chronic small vessel ischemic white matter change. These results were called by telephone at the time of interpretation on 08/23/2015 at 4:22 pm to Dr. Lavon Paganini, who verbally acknowledged these results. Electronically Signed   By: Delbert Phenix M.D.   On: 08/23/2015 16:29   Ct Angio Neck W/cm &/or Wo/cm  08/26/2015  CLINICAL DATA:  Acute RIGHT lateral temporal lobe infarct. Possible abnormal carotid Dopplers. EXAM: CT ANGIOGRAPHY NECK TECHNIQUE: Multidetector CT imaging of the neck was performed using the standard protocol during bolus administration of intravenous contrast. Multiplanar CT image reconstructions and MIPs were obtained to evaluate the vascular anatomy. Carotid stenosis measurements (when applicable) are obtained utilizing NASCET criteria, using the distal internal carotid diameter as the denominator. CONTRAST:  50mL OMNIPAQUE IOHEXOL 350 MG/ML SOLN COMPARISON:  MR brain 08/23/2015. MRA intracranial 08/23/2015. CT head 08/23/2015. FINDINGS: Aortic arch: Standard branching. Imaged portion shows no evidence of aneurysm or dissection. No significant stenosis of the major arch vessel origins. Right carotid system: Calcified and noncalcified plaque at the bifurcation, proximal ICA, without measurable stenosis. Calcified plaque also within the proximal cervical ICA is non flow reducing. Moderate dolichoectasia with a non stenotic kink at the C2 level. No evidence of dissection, stenosis (50% or greater) or occlusion. Moderate calcific atheromatous change at the siphon level. Left carotid system: Calcified and noncalcified plaque at the bifurcation, proximal ICA, without measurable stenosis. Noncalcified calcific plaque in the proximal cervical ICA. Dolichoectasia  in a cervical ICA more distally. Moderate calcific atheromatous change at the siphon level. No evidence of dissection, stenosis (50% or greater) or occlusion. Vertebral arteries: Non stenotic soft and calcific plaque at the RIGHT vertebral origin. Estimated 75% stenosis representing calcific plaque at the LEFT vertebral ostium, extending cephalad for 1-2 cm. RIGHT vertebral is slightly dominant. Skeleton: Cervical spondylosis. Multiple missing teeth and dental caries. No evidence for osteomyelitis. Other neck: No neck masses. Lung apices clear. No mediastinal lymph nodes are evident. Normal thyroid. No neck masses. IMPRESSION: Non stenotic atheromatous change at both carotid bifurcations extending into the proximal cervical ICA segments. No flow reducing lesion is evident. BILATERAL ostial vertebral disease, potentially flow reducing on the LEFT, but both vertebrals contribute to formation of the basilar. Electronically Signed   By: Elsie Stain M.D.   On: 08/26/2015 16:31   Mr Brain Wo Contrast  08/23/2015  CLINICAL DATA:  Episode of slurred speech and blurred vision this afternoon. EXAM: MRI HEAD WITHOUT CONTRAST MRA HEAD WITHOUT CONTRAST TECHNIQUE: Multiplanar, multiecho pulse sequences of the brain and surrounding structures were obtained without intravenous contrast. Angiographic images of the head were obtained using MRA technique without contrast. COMPARISON:  CT of the head without contrast from the same day. FINDINGS: MRI HEAD FINDINGS The diffusion-weighted images demonstrate an acute nonhemorrhagic infarct in the posterior right temporal lobe. A punctate subcortical white matter infarct in the anterior right frontal lobe is also  suspected. No acute infarct is evident. Moderate atrophy and diffuse white matter changes are present bilaterally. Remote lacunar infarcts are present in the thalami and basal ganglia bilaterally. The brainstem is within normal limits. Mild cerebellar atrophy is present. Flow  is present in the major intracranial arteries. The globes and orbits are intact. The paranasal sinuses and mastoid air cells are clear. MRA HEAD FINDINGS Mild atherosclerotic irregularity is present throughout the cavernous internal carotid arteries bilaterally without significant stenosis. There is focal signal loss in the distal A1 segments bilaterally suggesting moderate stenosis. Additional left greater than right distal ACA stenoses are present. There is a high-grade stenosis in the distal left M1 segment. Mild narrowing is present in the distal right M1 segment. There is significant attenuation of MCA branch vessels bilaterally, likely exaggerated by significant patient motion. The vertebral arteries are codominant. The PICA origins are not visualized. The basilar artery is intact. Both posterior cerebral arteries originate from the basilar tip. Mild to moderate proximal PCA stenoses are evident bilaterally. IMPRESSION: 1. Acute nonhemorrhagic posterior temporal lobe infarct. 2. Moderate atrophy and advanced confluent periventricular white matter disease suggesting chronic microvascular ischemia. 3. Diffuse intracranial atherosclerotic changes. 4. Moderate stenoses of the distal A1 segments bilaterally. 5. Moderate to high-grade stenosis of the distal left M1 segment. 6. Mild to moderate proximal PCA stenoses bilaterally. These results will be called to the ordering clinician or representative by the Radiologist Assistant, and communication documented in the PACS or zVision Dashboard. Electronically Signed   By: Marin Roberts M.D.   On: 08/23/2015 19:11   Mr Maxine Glenn Head/brain Wo Cm  08/23/2015  CLINICAL DATA:  Episode of slurred speech and blurred vision this afternoon. EXAM: MRI HEAD WITHOUT CONTRAST MRA HEAD WITHOUT CONTRAST TECHNIQUE: Multiplanar, multiecho pulse sequences of the brain and surrounding structures were obtained without intravenous contrast. Angiographic images of the head were  obtained using MRA technique without contrast. COMPARISON:  CT of the head without contrast from the same day. FINDINGS: MRI HEAD FINDINGS The diffusion-weighted images demonstrate an acute nonhemorrhagic infarct in the posterior right temporal lobe. A punctate subcortical white matter infarct in the anterior right frontal lobe is also suspected. No acute infarct is evident. Moderate atrophy and diffuse white matter changes are present bilaterally. Remote lacunar infarcts are present in the thalami and basal ganglia bilaterally. The brainstem is within normal limits. Mild cerebellar atrophy is present. Flow is present in the major intracranial arteries. The globes and orbits are intact. The paranasal sinuses and mastoid air cells are clear. MRA HEAD FINDINGS Mild atherosclerotic irregularity is present throughout the cavernous internal carotid arteries bilaterally without significant stenosis. There is focal signal loss in the distal A1 segments bilaterally suggesting moderate stenosis. Additional left greater than right distal ACA stenoses are present. There is a high-grade stenosis in the distal left M1 segment. Mild narrowing is present in the distal right M1 segment. There is significant attenuation of MCA branch vessels bilaterally, likely exaggerated by significant patient motion. The vertebral arteries are codominant. The PICA origins are not visualized. The basilar artery is intact. Both posterior cerebral arteries originate from the basilar tip. Mild to moderate proximal PCA stenoses are evident bilaterally. IMPRESSION: 1. Acute nonhemorrhagic posterior temporal lobe infarct. 2. Moderate atrophy and advanced confluent periventricular white matter disease suggesting chronic microvascular ischemia. 3. Diffuse intracranial atherosclerotic changes. 4. Moderate stenoses of the distal A1 segments bilaterally. 5. Moderate to high-grade stenosis of the distal left M1 segment. 6. Mild to moderate proximal PCA  stenoses bilaterally. These results will be called to the ordering clinician or representative by the Radiologist Assistant, and communication documented in the PACS or zVision Dashboard. Electronically Signed   By: Marin Roberts M.D.   On: 08/23/2015 19:11     PERTINENT LAB RESULTS: CBC:  Recent Labs  08/26/15 1108  WBC 7.2  HGB 15.0  HCT 44.0  PLT 88*   CMET CMP     Component Value Date/Time   NA 137 08/26/2015 0435   K 3.8 08/26/2015 0435   CL 107 08/26/2015 0435   CO2 23 08/26/2015 0435   GLUCOSE 103* 08/26/2015 0435   BUN 21* 08/26/2015 0435   CREATININE 1.50* 08/26/2015 0435   CREATININE 1.45* 06/23/2013 1115   CALCIUM 8.7* 08/26/2015 0435   PROT 7.0 08/24/2015 0530   ALBUMIN 3.7 08/24/2015 0530   AST 25 08/24/2015 0530   ALT 26 08/24/2015 0530   ALKPHOS 76 08/24/2015 0530   BILITOT 1.5* 08/24/2015 0530   GFRNONAA 48* 08/26/2015 0435   GFRAA 55* 08/26/2015 0435    GFR Estimated Creatinine Clearance: 64.3 mL/min (by C-G formula based on Cr of 1.5). No results for input(s): LIPASE, AMYLASE in the last 72 hours. No results for input(s): CKTOTAL, CKMB, CKMBINDEX, TROPONINI in the last 72 hours. Invalid input(s): POCBNP No results for input(s): DDIMER in the last 72 hours. No results for input(s): HGBA1C in the last 72 hours. No results for input(s): CHOL, HDL, LDLCALC, TRIG, CHOLHDL, LDLDIRECT in the last 72 hours. No results for input(s): TSH, T4TOTAL, T3FREE, THYROIDAB in the last 72 hours.  Invalid input(s): FREET3 No results for input(s): VITAMINB12, FOLATE, FERRITIN, TIBC, IRON, RETICCTPCT in the last 72 hours. Coags: No results for input(s): INR in the last 72 hours.  Invalid input(s): PT Microbiology: No results found for this or any previous visit (from the past 240 hour(s)).   BRIEF HOSPITAL COURSE:  Acute CVA: Dysarthria has improved-only mild/minimal dysarthria present. Nonfocal exam. MRI brain shows acute posterior temporal lobe infarct.  MRA brain shows significant intracranial disease. Echo shows preserved ejection fraction him a carotid Dopplers with questionable stenosis on the right side-CT angiogram neck did not show any major stenosis. LDL 112 (goal<70) continue Crestor. A1c 5.1. Has history of atrial fibrillation-but given thrombocytopenia/polysubstance abuse - remains a poor/high risk long-term candidate for anticoagulation-this was discussed repeatedly with patient and spouse. Dr Roda Shutters had a long d/w spouse/aptient on 12/3-and recommended Eliquis. This MD then had another long discussion with patient and spouse at bedside on 12/3-Pros and cons of starting anticoagulation was discussed in great detail-both of them are aware of risk of catastrophic embolic event in the absence of anticoagulation, but also aware of life-threatening and life disabling risk of anticoagulation given thrombocytopenia/alcohol use and cocaine use.Patient and family further discussed amongst themselves, and have notified this MD that would want to start anticoagulation. They have indicated to me this morning that they are willing to accept bleeding risks-they are very aware of catastrophic bleeding consequences which could be life threatening or life disabling. Pharmacy was subsequently consulted-started on Eliquis. Patient is aware of need for immediate medical attention in case of severe headache/stroke like symp/MVA/Trauma/Hemetemesis/Melana or Hematochezia.   Atrial fibrillation with RVR: Rate controlled with, continue Cardizem-on discharge. See above regarding anticoagulation   Thrombocytopenia: Suspect secondary to cirrhosis. Chronic issue-stable for outpatient monitoring-especially while on anticoagulation.  Hypertension: Allowed permissive hypertension-continue Cardizem and Imdur.Follow and titrate accordingly. Would avoid beta blockers given cocaine use.  Cirrhosis: Likely secondary to hep  C/alcohol. Compensated at present  Alcohol abuse: Although  has cut down significantly-still consumes approximately 40 ounces of beer on a daily basis. No signs of withdrawal. Have counseled multiple times regarding importance of stopping ETOH-now more important as on anticoagulation.  Polysubstance abuse: Claims to regularly use cocaine and marijuana as well. UDS positive for cocaine and cannabinoids. Counseled extensively.  CKD stage III: Creatinine close to usual baseline. Follow  Hypokalemia: Repleted.  History of medication noncompliance: Counseled  PAD (peripheral artery disease), PTA to Lt. SFA in 2009, total occlusion mid Rt SFA with collaterals-stop ASA-as on Eliquis.    TODAY-DAY OF DISCHARGE:  Subjective:   Antwine Agosto today has no headache,no chest abdominal pain,no new weakness tingling or numbness, feels much better wants to go home today.   Objective:   Blood pressure 156/96, pulse 76, temperature 97.9 F (36.6 C), temperature source Oral, resp. rate 18, height 6\' 6"  (1.981 m), weight 90.2 kg (198 lb 13.7 oz), SpO2 100 %.  Intake/Output Summary (Last 24 hours) at 08/27/15 1018 Last data filed at 08/27/15 0944  Gross per 24 hour  Intake   1480 ml  Output    700 ml  Net    780 ml   Filed Weights   08/24/15 1700  Weight: 90.2 kg (198 lb 13.7 oz)    Exam Awake Alert, Oriented *3, No new F.N deficits, Normal affect [place].AT,PERRAL Supple Neck,No JVD, No cervical lymphadenopathy appriciated.  Symmetrical Chest wall movement, Good air movement bilaterally, CTAB RRR,No Gallops,Rubs or new Murmurs, No Parasternal Heave +ve B.Sounds, Abd Soft, Non tender, No organomegaly appriciated, No rebound -guarding or rigidity. No Cyanosis, Clubbing or edema, No new Rash or bruise  DISCHARGE CONDITION: Stable  DISPOSITION: Home with outpatient PT  DISCHARGE INSTRUCTIONS:    Activity:  As tolerated   Please get a complete blood count and chemistry panel checked by your Primary MD at your next visit, and again as instructed by  your Primary MD.  You have been started on Blood thinners-please seek immediate medical attention if you have severe headache, stroke like symptoms, black/bloody stools, vomit blood or get involved in MVA/Trauma/head injury/fall.   AVOID ANY FURTHER ETOH/COCAINE AND MARIJUANA USE  Get Medicines reviewed and adjusted: Please take all your medications with you for your next visit with your Primary MD  Please request your Primary MD to go over all hospital tests and procedure/radiological results at the follow up, please ask your Primary MD to get all Hospital records sent to his/her office.  If you experience worsening of your admission symptoms, develop shortness of breath, life threatening emergency, suicidal or homicidal thoughts you must seek medical attention immediately by calling 911 or calling your MD immediately  if symptoms less severe.  You must read complete instructions/literature along with all the possible adverse reactions/side effects for all the Medicines you take and that have been prescribed to you. Take any new Medicines after you have completely understood and accpet all the possible adverse reactions/side effects.   Do not drive when taking Pain medications.   Do not take more than prescribed Pain, Sleep and Anxiety Medications  Special Instructions: If you have smoked or chewed Tobacco  in the last 2 yrs please stop smoking, stop any regular Alcohol  and or any Recreational drug use.  Wear Seat belts while driving.  Please note  You were cared for by a hospitalist during your hospital stay. Once you are discharged, your primary care physician will handle any further medical  issues. Please note that NO REFILLS for any discharge medications will be authorized once you are discharged, as it is imperative that you return to your primary care physician (or establish a relationship with a primary care physician if you do not have one) for your aftercare needs so that they can  reassess your need for medications and monitor your lab values.  Diet recommendation: Heart Healthy diet  Discharge Instructions    Ambulatory referral to Neurology    Complete by:  As directed      Call MD for:  difficulty breathing, headache or visual disturbances    Complete by:  As directed      Call MD for:  persistant dizziness or light-headedness    Complete by:  As directed      Call MD for:  persistant nausea and vomiting    Complete by:  As directed      Call MD for:  persistant nausea and vomiting    Complete by:  As directed      Diet - low sodium heart healthy    Complete by:  As directed      Diet - low sodium heart healthy    Complete by:  As directed      Increase activity slowly    Complete by:  As directed      Increase activity slowly    Complete by:  As directed            Follow-up Information    Follow up with OSEI-BONSU,GEORGE, MD. Schedule an appointment as soon as possible for a visit in 1 week.   Specialty:  Internal Medicine   Contact information:   [address] [place] [address] [phone]       Follow up with [facility].   Specialty:  Rehabilitation   Why:  will call in the next 3-4 days to schedule first physical therapy appointment.   Contact information:   [address] [id number] mc [place] [address] [phone]      Follow up with [facility].   Why:  3 in 1 to be delivered to room prior to discharge   Contact information:   [address] [place] [address] [phone]       Follow up with Delia Heady, MD.   Specialties:  Neurology, Radiology   Why:  office will call-with appointment-if you do not hear from them, please give them a call.    Contact information:   [address] [place] [address] [phone]      Total Time spent on discharge equals  45  minutes.  SignedJeoffrey Massed 08/27/2015 10:18 AM

## 2015-08-25 NOTE — Progress Notes (Signed)
Occupational Therapy Treatment Patient Details Name: Kevin [name] MRN: [id number] DOB: Jan 13, 1951 Today's Date: 08/25/2015    History of present illness Patient is a 64 y/o male presents with slurred speech and visual changes. Admitted CODE stroke. MRI-Acute nonhemorrhagic posterior temporal lobe infarct.  PMH includes HTN, HLD, Hep C, polysubstance abuse, cirrhosis, and medical non-compliance.    OT comments  Pt. Making gains, able to complete ub/lb b/d with intermittent cues and cga/mina for sequencing and tasks completion.  Cont. To recommend out pt. OT for continued therapy to increase safety and independence with ADLS with further vision and perception testing.  Reviewed with pt. He is not to drive at this time, he verbalized understanding and states he will not.    Follow Up Recommendations  Outpatient OT;Supervision/Assistance - 24 hour    Equipment Recommendations  3 in 1 bedside comode    Recommendations for Other Services      Precautions / Restrictions Precautions Precautions: Fall Precaution Comments: elevated BP, HR Restrictions Weight Bearing Restrictions: No       Mobility Bed Mobility Overal bed mobility: Modified Independent                Transfers Overall transfer level: Needs assistance Equipment used: None Transfers: Sit to/from Stand Sit to Stand: Supervision         General transfer comment: Supervision for safety. Stood from [employer].     Balance Overall balance assessment: Needs assistance Sitting-balance support: Feet supported;No upper extremity supported Sitting balance-Leahy Scale: Good     Standing balance support: During functional activity Standing balance-Leahy Scale: Fair                     ADL Overall ADL's : Needs assistance/impaired     Grooming: Wash/dry hands;Wash/dry face;Oral care;Applying deodorant;Brushing hair;Set up;Sitting   Upper Body Bathing: Set up;Sitting   Lower Body Bathing: Min  guard;Minimal assistance;Sit to/from stand   Upper Body Dressing : Set up;Sitting                     General ADL Comments: Appears unsteadyat times. Delayed processing with ADL tasks      Vision                     Perception     Praxis      Cognition   Behavior During Therapy: [facility] for tasks assessed/performed Overall Cognitive Status: No family/caregiver present to determine baseline cognitive functioning                Problem Solving: Slow processing;Difficulty sequencing      Extremity/Trunk Assessment               Exercises     Shoulder Instructions       General Comments      Pertinent Vitals/ Pain       Pain Assessment: No/denies pain  Home Living                                          Prior Functioning/Environment              Frequency Min 2X/week     Progress Toward Goals  OT Goals(current goals can now be found in the care plan section)  Progress towards OT goals: Progressing toward goals     Plan Discharge plan remains  appropriate    Co-evaluation                 End of Session     Activity Tolerance Patient tolerated treatment well   Patient Left in bed;with call bell/phone within reach;with bed alarm set   Nurse Communication          Time: 4098-1191 OT Time Calculation (min): 19 min  Charges: OT General Charges $OT Visit: 1 Procedure  Robet Leu, COTA/L 08/25/2015, 12:14 PM

## 2015-08-25 NOTE — Progress Notes (Signed)
Pt with an elevated bp of 206/116. Pt given 5mg  of hydralzine.will continue to monitor pt

## 2015-08-25 NOTE — Progress Notes (Signed)
STROKE TEAM PROGRESS NOTE   HISTORY Kevin [name] is an 64 y.o. male male with history of right craniotomy who seemed to be normal at 1000 hours (LKW 08/23/2015 at 10 AM). When wife called him at 1500 hours she thought his speech was slurred and he was complaining of blurred vision. On arrival to ED his speech cleared and his blurred vision cleared. Currently he has a possible left field cut but he states this may be old. patient has known Afib but is only on ASA. Patient was not administered TPA secondary to out of the window. He was admitted for further evaluation and treatment.   SUBJECTIVE (INTERVAL HISTORY) No family is at the bedside.  Patient is stable. Echo and Dopplers are pending  OBJECTIVE Temp:  [97.9 F (36.6 C)-98.8 F (37.1 C)] 98.2 F (36.8 C) (12/02 0627) Pulse Rate:  [54-95] 95 (12/02 0627) Cardiac Rhythm:  [-] Atrial fibrillation (12/02 0708) Resp:  [13-16] 14 (12/02 0627) BP: (163-206)/(72-116) 182/92 mmHg (12/02 0627) SpO2:  [98 %-100 %] 100 % (12/02 0627) Weight:  [198 lb 13.7 oz (90.2 kg)] 198 lb 13.7 oz (90.2 kg) (12/01 1700)  CBC:   Recent Labs Lab 08/23/15 1619 08/23/15 1630  WBC 7.3  --   NEUTROABS 5.7  --   HGB 14.2 16.0  HCT 41.6 47.0  MCV 99.5  --   PLT 95*  --     Basic Metabolic Panel:   Recent Labs Lab 08/23/15 1619 08/23/15 1630 08/24/15 0530  NA 139 141 136  K 3.8 3.7 3.3*  CL 105 102 104  CO2 27  --  24  GLUCOSE 94 95 104*  BUN 16 21* 11  CREATININE 1.41* 1.50* 1.16  CALCIUM 9.0  --  8.7*    Lipid Panel:     Component Value Date/Time   CHOL 182 08/24/2015 0530   TRIG 64 08/24/2015 0530   HDL 57 08/24/2015 0530   CHOLHDL 3.2 08/24/2015 0530   VLDL 13 08/24/2015 0530   LDLCALC 112* 08/24/2015 0530   HgbA1c:  Lab Results  Component Value Date   HGBA1C 5.1 08/24/2015   Urine Drug Screen:     Component Value Date/Time   LABOPIA NONE DETECTED 08/24/2015 1703   COCAINSCRNUR POSITIVE* 08/24/2015 1703   LABBENZ  NONE DETECTED 08/24/2015 1703   AMPHETMU NONE DETECTED 08/24/2015 1703   THCU POSITIVE* 08/24/2015 1703   LABBARB NONE DETECTED 08/24/2015 1703      IMAGING  Ct Head Wo Contrast 08/23/2015   1.  No evidence of acute intracranial abnormality. 2. Intracranial atherosclerosis and chronic small vessel ischemic white matter change.   Mri & Mra Brain Wo Contrast 08/23/2015   1. Acute nonhemorrhagic posterior temporal lobe infarct. 2. Moderate atrophy and advanced confluent periventricular white matter disease suggesting chronic microvascular ischemia. 3. Diffuse intracranial atherosclerotic changes. 4. Moderate stenoses of the distal A1 segments bilaterally. 5. Moderate to high-grade stenosis of the distal left M1 segment. 6. Mild to moderate proximal PCA stenoses bilaterally.    PHYSICAL EXAM [name] not in distress. . Afebrile. Head is nontraumatic. Neck is supple without bruit.    Cardiac exam no murmur or gallop. Lungs are clear to auscultation. Distal pulses are well felt. Neurological Exam ;  Awake  Alert oriented x 3. Normal speech and language.eye movements full without nystagmus.fundi were not visualized. Vision acuity and fields appear normal. Hearing is normal. Palatal movements are normal. Face symmetric. Tongue midline. Normal strength, tone, reflexes and coordination.  Normal sensation. Gait deferred. ASSESSMENT/PLAN Kevin [name] is a 64 y.o. male with history of right craniotomy, hypertension, tobacco and cocaine use, PAD, CHF, CAD, cigarette smoking, thrombocytopenia presenting with slurred speech and blurred vision. He did not receive IV t-PA due to delay in arrival.   Stroke:  right posterior temporal lobe infarct embolic secondary to known atrial fibrillation  MRI  Right posterior temporal lobe infarct. Diffuse intracranial stenotic changes  MRA  Moderate to high-grade stenosis distal left M1. Moderate bilateral PCA and distal bilateral A1  segment stenoses  Carotid Doppler  pending  2D Echo  pending  LDL 112  HgbA1c 5.4  SCDs for VTE prophylaxis Diet Heart Room service appropriate?: Yes; Fluid consistency:: Thin Diet - low sodium heart healthy  aspirin 81 mg daily prior to admission, now on aspirin 325 mg daily and clopidogrel 75 mg daily. Return to aspirin 81 mg as prior to admission.  Ongoing aggressive stroke risk factor management  Therapy recommendations:  Outpatient OT  Disposition:  Pending, anticipate return home  Atrial Fibrillation with RVR  Home anticoagulation:  none  Not on anticoagulation prior to admission due to thrombocytopenia. Taken off Xarelto in 2013 due to thrombocytopenia.  Hypertension  Elevated Permissive hypertension (OK if < 220/120) but gradually normalize in 5-7 days  Hyperlipidemia  Home meds:  No statin  LDL 112, goal < 70  Crestor 20 mg added  Continue statin at discharge  Other Stroke Risk Factors  Cocaine use, urine drug screen positive this admission  Cigarette smoker, advised to stop smoking  History of alcohol abuse, decreased intake at this time, confirmed by wife  Marijuana use, urine drug screen positive on admission  PAD Status post right leg stent placement  Chronic diastolic heart failure  Carotid artery stenosis status post carotid artery angioplasty  Other Active Problems  Hepatitis C  Thrombocytopenia, Platelet 95. PER HISTORY, PATIENT WITH THROMBOCYTOPENIA ON ADMISSION 04/30/2012 OUT TO BE SECONDARY TO xARELTO. pLATELETS NOT IMPROVED IN 2015.  Liver cirrhosis secondary to alcohol and hepatitis C, compensated.   Chronic kidney disease stage III  Hospital day # 1  SETHI,PRAMOD  [facility] See Amion for Pager information 08/25/2015 2:19 PM  I have personally examined this patient, reviewed notes, independently viewed imaging studies, participated in medical decision making and plan of care. I have made any additions or  clarifications directly to the above note. Agree with note above. He presented with transient speech and vision difficulties due to a small embolic right temporal infarct secondary to atrial fibrillation. Patient remains at risk for recurrent stroke, TIA, neurological worsening but he may not be a good long-term candidate for anticoagulation given history of thrombocytopenia, alcohol abuse and liver cirrhosis and kidney disease.  Delia Heady, MD Medical Director [facility] Pager: [phone] 08/25/2015 2:19 PM    To contact Stroke Continuity provider, please refer to [email or web address]. After hours, contact General Neurology

## 2015-08-25 NOTE — Care Management Note (Signed)
Case Management Note  Patient Details  Name: ANTWAINE PUTNAM MRN: [id number] Date of Birth: 1950/11/12  Subjective/Objective:                 Patient from home, admitted with stroke. Will require referral for OP PT, and 3in1.   Action/Plan:  Referral made to [facility], and 3in1 to be delivered to room prior to discharge.  Expected Discharge Date:                  Expected Discharge Plan:  Home/Self Care  In-House Referral:     Discharge planning Services  CM Consult  Post Acute Care Choice:  Durable Medical Equipment Choice offered to:  Patient  DME Arranged:  3-N-1 DME Agency:  [facility]  HH Arranged:    HH Agency:     Status of Service:  Completed, signed off  Medicare Important Message Given:    Date Medicare IM Given:    Medicare IM give by:    Date Additional Medicare IM Given:    Additional Medicare Important Message give by:     If discussed at Long Length of Stay Meetings, dates discussed:    Additional Comments:  Lawerance Sabal, RN 08/25/2015, 12:24 PM

## 2015-08-26 ENCOUNTER — Inpatient Hospital Stay (HOSPITAL_COMMUNITY): Payer: BLUE CROSS/BLUE SHIELD

## 2015-08-26 ENCOUNTER — Encounter (HOSPITAL_COMMUNITY): Payer: Self-pay | Admitting: Radiology

## 2015-08-26 DIAGNOSIS — I63411 Cerebral infarction due to embolism of right middle cerebral artery: Secondary | ICD-10-CM

## 2015-08-26 DIAGNOSIS — I639 Cerebral infarction, unspecified: Secondary | ICD-10-CM

## 2015-08-26 DIAGNOSIS — N183 Chronic kidney disease, stage 3 (moderate): Secondary | ICD-10-CM

## 2015-08-26 DIAGNOSIS — Z720 Tobacco use: Secondary | ICD-10-CM

## 2015-08-26 LAB — BASIC METABOLIC PANEL
Anion gap: 7 (ref 5–15)
BUN: 21 mg/dL — ABNORMAL HIGH (ref 6–20)
CO2: 23 mmol/L (ref 22–32)
Calcium: 8.7 mg/dL — ABNORMAL LOW (ref 8.9–10.3)
Chloride: 107 mmol/L (ref 101–111)
Creatinine, Ser: 1.5 mg/dL — ABNORMAL HIGH (ref 0.61–1.24)
GFR calc Af Amer: 55 mL/min — ABNORMAL LOW (ref >60)
GFR calc non Af Amer: 48 mL/min — ABNORMAL LOW (ref >60)
Glucose, Bld: 103 mg/dL — ABNORMAL HIGH (ref 65–99)
Potassium: 3.8 mmol/L (ref 3.5–5.1)
Sodium: 137 mmol/L (ref 135–145)

## 2015-08-26 LAB — CBC
HCT: 44 % (ref 39.0–52.0)
Hemoglobin: 15 g/dL (ref 13.0–17.0)
MCH: 33.9 pg (ref 26.0–34.0)
MCHC: 34.1 g/dL (ref 30.0–36.0)
MCV: 99.3 fL (ref 78.0–100.0)
Platelets: 88 10*3/uL — ABNORMAL LOW (ref 150–400)
RBC: 4.43 MIL/uL (ref 4.22–5.81)
RDW: 12.5 % (ref 11.5–15.5)
WBC: 7.2 10*3/uL (ref 4.0–10.5)

## 2015-08-26 MED ORDER — SODIUM CHLORIDE 0.9 % IV SOLN
INTRAVENOUS | Status: AC
  Administered 2015-08-26: 15:00:00 via INTRAVENOUS

## 2015-08-26 MED ORDER — DILTIAZEM HCL 60 MG PO TABS
120.0000 mg | ORAL_TABLET | Freq: Two times a day (BID) | ORAL | Status: DC
  Administered 2015-08-26 – 2015-08-27 (×2): 120 mg via ORAL
  Filled 2015-08-26 (×2): qty 2

## 2015-08-26 MED ORDER — IOHEXOL 350 MG/ML SOLN
50.0000 mL | Freq: Once | INTRAVENOUS | Status: AC | PRN
  Administered 2015-08-26: 50 mL via INTRAVENOUS

## 2015-08-26 NOTE — Progress Notes (Signed)
*  PRELIMINARY RESULTS* Vascular Ultrasound Carotid Duplex (Doppler) has been completed.   Study was technically difficult due to patient anatomy. There is no obvious evidence of hemodynamically significant stenosis involving the right internal carotid artery. However, the right ICA exhibits atypical, high resistant waveforms suggestive of a possible distal obstruction versus unknown etiology. Unable to visualize the right distal ICA due to technical limitations. Left internal carotid artery evaluation is suggestive of 1-39% stenosis. Vertebral arteries are patent with antegrade flow bilaterally.   08/26/2015 10:53 AM Gertie Fey, RVT, RDCS, RDMS

## 2015-08-26 NOTE — Progress Notes (Signed)
STROKE TEAM PROGRESS NOTE   HISTORY Kevin [name] is an 64 y.o. male male with history of right craniotomy who seemed to be normal at 1000 hours (LKW 08/23/2015 at 10 AM). When wife called him at 1500 hours she thought his speech was slurred and he was complaining of blurred vision. On arrival to ED his speech cleared and his blurred vision cleared. Currently he has a possible left field cut but he states this may be old. patient has known Afib but is only on ASA. Patient was not administered TPA secondary to out of the window. He was admitted for further evaluation and treatment.   SUBJECTIVE (INTERVAL HISTORY) No family members present. The patient states he was on Xarelto in the past for his atrial fibrillation. This was discontinued secondary to thrombocytopenia. The patient admits that he still smokes tobacco, drinks alcohol, smokes marijuana, and occasionally uses cocaine. He plans to stop all of these activities.  Later, discussed with wife regarding the anticoagulation. Risk and benefits presented to wife and made her informed. She would like to start NOAC with eliquis for stroke prevention being aware of the risks.   OBJECTIVE Temp:  [98.1 F (36.7 C)-98.6 F (37 C)] 98.1 F (36.7 C) (12/03 0506) Pulse Rate:  [66-110] 110 (12/03 0506) Cardiac Rhythm:  [-] Atrial fibrillation (12/02 1946) Resp:  [18-23] 20 (12/03 0506) BP: (140-166)/(77-115) 166/95 mmHg (12/03 0506) SpO2:  [99 %-100 %] 100 % (12/03 0506)  CBC:   Recent Labs Lab 08/23/15 1619 08/23/15 1630  WBC 7.3  --   NEUTROABS 5.7  --   HGB 14.2 16.0  HCT 41.6 47.0  MCV 99.5  --   PLT 95*  --     Basic Metabolic Panel:   Recent Labs Lab 08/24/15 0530 08/26/15 0435  NA 136 137  K 3.3* 3.8  CL 104 107  CO2 24 23  GLUCOSE 104* 103*  BUN 11 21*  CREATININE 1.16 1.50*  CALCIUM 8.7* 8.7*    Lipid Panel:     Component Value Date/Time   CHOL 182 08/24/2015 0530   TRIG 64 08/24/2015 0530   HDL 57  08/24/2015 0530   CHOLHDL 3.2 08/24/2015 0530   VLDL 13 08/24/2015 0530   LDLCALC 112* 08/24/2015 0530   HgbA1c:  Lab Results  Component Value Date   HGBA1C 5.1 08/24/2015   Urine Drug Screen:     Component Value Date/Time   LABOPIA NONE DETECTED 08/24/2015 1703   COCAINSCRNUR POSITIVE* 08/24/2015 1703   LABBENZ NONE DETECTED 08/24/2015 1703   AMPHETMU NONE DETECTED 08/24/2015 1703   THCU POSITIVE* 08/24/2015 1703   LABBARB NONE DETECTED 08/24/2015 1703      IMAGING I have personally reviewed the radiological images below and agree with the radiology interpretations.  Ct Head Wo Contrast 08/23/2015   1.  No evidence of acute intracranial abnormality. 2. Intracranial atherosclerosis and chronic small vessel ischemic white matter change.   Mri & Mra Brain Wo Contrast 08/23/2015    1. Acute nonhemorrhagic posterior temporal lobe infarct.  2. Moderate atrophy and advanced confluent periventricular white matter disease suggesting chronic microvascular ischemia. 3. Diffuse intracranial atherosclerotic changes.  4. Moderate stenoses of the distal A1 segments bilaterally.  5. Moderate to high-grade stenosis of the distal left M1 segment.  6. Mild to moderate proximal PCA stenoses bilaterally.   Carotid ultrasound 08/26/2015 There is no obvious evidence of hemodynamically significant stenosis involving the right internal carotid artery. However, the right ICA exhibits atypical, high  resistant waveforms suggestive of a possible distal obstruction versus unknown etiology. Unable to visualize the right distal ICA due to technical limitations. Left internal carotid artery evaluation is suggestive of 1-39% stenosis. Vertebral arteries are patent with antegrade flow bilaterally.  CTA neck - pending  2D echo - - Left ventricle: The cavity size was normal. There was mild concentric hypertrophy. Systolic function was normal. The estimated ejection fraction was in the range of 55% to  60%. Wall motion was normal; there were no regional wall motion abnormalities. - Left atrium: The atrium was moderately dilated.   PHYSICAL EXAM [name] not in distress. . Afebrile. Head is nontraumatic. Neck is supple without bruit.    Cardiac exam irregularly irregular heart rate and rhythm. Lungs are clear to auscultation. Distal pulses are well felt. Neurological Exam: Awake  Alert oriented x 3. Normal speech and language.eye movements full without nystagmus.fundi were not visualized. Vision acuity and fields appear normal. Hearing is normal. Palatal movements are normal. Face symmetric. Tongue midline. Normal strength, tone, reflexes and coordination. Normal sensation. Gait deferred.  ASSESSMENT/PLAN Kevin [name] is a 64 y.o. male with history of right craniotomy, hypertension, tobacco and cocaine use, PAD, CHF, CAD, cigarette smoking, thrombocytopenia presenting with slurred speech and blurred vision. He did not receive IV t-PA due to delay in arrival.   Stroke:  right posterior temporal lobe infarct embolic secondary to known atrial fibrillation. Cocaine use may be contributing too.   MRI  Right posterior temporal lobe infarct.   MRA  Moderate to high-grade stenosis distal left M1. Moderate bilateral PCA and distal bilateral A1 segment stenoses  CTA neck - pending  Carotid Doppler concerning for right ICA distal occlusion.  2D Echo - EF 55-60%. No cardiac source of emboli identified.  LDL 112  HgbA1c 5.4  SCDs for VTE prophylaxis Diet Heart Room service appropriate?: Yes; Fluid consistency:: Thin Diet - low sodium heart healthy  aspirin 81 mg daily prior to admission, now on aspirin 325 mg daily and clopidogrel 75 mg daily. Return to aspirin 81 mg as prior to admission.  Ongoing aggressive stroke risk factor management  Therapy recommendations:  Outpatient OT  Disposition:  Pending, anticipate return home  Atrial Fibrillation with  RVR  Home anticoagulation:  none  Not on anticoagulation prior to admission due to thrombocytopenia and medication noncompliance as well as substance abuse, taken off Xarelto.  Dr. Clifton James wrote "Per records, on Xarelto in past but stopped due to thrombocytopenia. Not a good coumadin candidate due to non-compliance."  Further discussed with wife who prefers to go back on NOACs due to new stroke. We recommend eliquis and pt was educated on medication compliance.  Hypertension  Elevated  Permissive hypertension (OK if < 220/120) but gradually normalize in 5-7 days.  Hyperlipidemia  Home meds:  No statin  LDL 112, goal < 70  Crestor 20 mg added  Continue statin at discharge  Other Stroke Risk Factors  Cocaine use, urine drug screen positive this admission  Cigarette smoker, advised to stop smoking  History of alcohol abuse, decreased intake at this time, confirmed by wife  Marijuana use, urine drug screen positive on admission  PAD Status post right leg stent placement  Chronic diastolic heart failure  Carotid artery stenosis status post carotid artery angioplasty  Other Active Problems  Hepatitis C  Thrombocytopenia, Platelet 95 -> 88. PER HISTORY, PATIENT WITH THROMBOCYTOPENIA ON ADMISSION 04/30/2012 OUT TO BE SECONDARY TO xARELTO. pLATELETS NOT IMPROVED IN 2015.  Liver cirrhosis secondary to alcohol and hepatitis C, compensated.   Chronic kidney disease stage III  Mild renal insufficiency.  Hospital day # 2  Marvel Plan, MD PhD Stroke Neurology 08/26/2015 10:25 PM  To contact Stroke Continuity provider, please refer to [email or web address]. After hours, contact General Neurology

## 2015-08-26 NOTE — Progress Notes (Addendum)
PATIENT DETAILS Name: Kevin [name] Age: 64 y.o. Sex: male Date of Birth: 06/30/1951 Admit Date: 08/23/2015 Admitting Physician Costin Otelia Sergeant, MD ZOX:[id number], MD  Subjective: Anxious to go home  Assessment/Plan: Active Problems: Acute CVA: Dysarthria has improved-only mild/minimal dysarthria present. Nonfocal exam. MRI brain shows acute posterior temporal lobe infarct. MRA brain shows significant intracranial disease. Echo shows preserved ejection fraction him a carotid Dopplers with questionable stenosis on the right side-CT angiogram ordered by neurology-currently pending. LDL 112 (goal<70) continue Crestor. A1c 5.1. Has history of atrial fibrillation-but given thrombocytopenia/polysubstance abuse - remains a poor/high risk long-term candidate for anticoagulation-this was discussed again with patient and spouse at bedside-they will discuss and let us know if they would want to start long-term anticoagulation. Pros and cons of starting anticoagulation was discussed in great detail-both of them aware of risk of catastrophic embolic event in the absence of anticoagulation, but also aware of life-threatening and life disabling risk of anticoagulation given thrombocytopenia/alcohol use and cocaine use.   Atrial fibrillation with RVR:  Rate controlled with, continue Cardizem-we changed 220 mg twice a day.. See above regarding anticoagulation (Note:Taken off Xarelto in 2013 due to thrombocytopenia)  Thrombocytopenia: Suspect secondary to cirrhosis. Chronic issue-stable for outpatient monitoring  Hypertension: Allow permissive hypertension-continue Cardizem and Imdur.Follow and titrate accordingly   Cirrhosis: Likely secondary to hep C/alcohol. Compensated at present  Alcohol abuse: Although has cut down significantly-still consumes approximately 40 ounces of beer on a daily basis. No signs of withdrawal, follow.  Polysubstance abuse: Claims to regularly use  cocaine and marijuana as well.  UDS positive for cocaine and cannabinoids. Counseled extensively.  CKD stage III: Creatinine close to usual baseline. Follow  Hypokalemia: Repleted.  History of medication noncompliance: Counseled  PAD (peripheral artery disease), PTA to Lt. SFA in 2009, total occlusion mid Rt SFA with collaterals     Disposition: Remain inpatient-home likely tomorrow  Antimicrobial agents  See below  Anti-infectives    None      DVT Prophylaxis:  SCD's  Code Status: Full code   Family Communication Spouse at bedside  Procedures: None  CONSULTS:  neurology  Time spent 20 minutes-Greater than 50% of this time was spent in counseling, explanation of diagnosis, planning of further management, and coordination of care.  MEDICATIONS: Scheduled Meds: . aspirin EC  81 mg Oral Daily  . diltiazem  60 mg Oral 3 times per day  . isosorbide mononitrate  30 mg Oral Daily  . rosuvastatin  20 mg Oral Daily   Continuous Infusions: . sodium chloride 75 mL/hr at 08/26/15 1449   PRN Meds:.hydrALAZINE, senna-docusate    PHYSICAL EXAM: Vital signs in last 24 hours: Filed Vitals:   08/25/15 1508 08/25/15 2111 08/26/15 0506 08/26/15 1443  BP: 140/77 153/98 166/95 148/92  Pulse: 94 66 110 84  Temp: 98.4 F (36.9 C) 98.2 F (36.8 C) 98.1 F (36.7 C) 98.3 F (36.8 C)  TempSrc: Oral Oral Oral Oral  Resp: 23 18 20    Height:      Weight:      SpO2: 99% 100% 100% 97%    Weight change:  Filed Weights   08/24/15 1700  Weight: 90.2 kg (198 lb 13.7 oz)   Body mass index is 22.98 kg/(m^2).   Gen Exam: Awake and alert with slight dysarthria   Neck: Supple, No JVD.   Chest: B/L Clear.   CVS: S1 S2 Regular, no murmurs.  Abdomen: soft, BS +, non tender, non distended.  Extremities: no edema, lower extremities warm to touch. Neurologic: Non Focal.   Skin: No Rash.   Wounds: N/A.   Intake/Output from previous day: No intake or output data in the 24  hours ending 08/26/15 1624   LAB RESULTS: CBC  Recent Labs Lab 08/23/15 1619 08/23/15 1630 08/26/15 1108  WBC 7.3  --  7.2  HGB 14.2 16.0 15.0  HCT 41.6 47.0 44.0  PLT 95*  --  88*  MCV 99.5  --  99.3  MCH 34.0  --  33.9  MCHC 34.1  --  34.1  RDW 12.5  --  12.5  LYMPHSABS 0.9  --   --   MONOABS 0.6  --   --   EOSABS 0.1  --   --   BASOSABS 0.0  --   --     Chemistries   Recent Labs Lab 08/23/15 1619 08/23/15 1630 08/24/15 0530 08/26/15 0435  NA 139 141 136 137  K 3.8 3.7 3.3* 3.8  CL 105 102 104 107  CO2 27  --  24 23  GLUCOSE 94 95 104* 103*  BUN 16 21* 11 21*  CREATININE 1.41* 1.50* 1.16 1.50*  CALCIUM 9.0  --  8.7* 8.7*    CBG: No results for input(s): GLUCAP in the last 168 hours.  GFR Estimated Creatinine Clearance: 64.3 mL/min (by C-G formula based on Cr of 1.5).  Coagulation profile  Recent Labs Lab 08/23/15 1619 08/24/15 0530  INR 1.05 1.03    Cardiac Enzymes No results for input(s): CKMB, TROPONINI, MYOGLOBIN in the last 168 hours.  Invalid input(s): CK  Invalid input(s): POCBNP No results for input(s): DDIMER in the last 72 hours.  Recent Labs  08/24/15 0530  HGBA1C 5.1    Recent Labs  08/24/15 0530  CHOL 182  HDL 57  LDLCALC 112*  TRIG 64  CHOLHDL 3.2   No results for input(s): TSH, T4TOTAL, T3FREE, THYROIDAB in the last 72 hours.  Invalid input(s): FREET3 No results for input(s): VITAMINB12, FOLATE, FERRITIN, TIBC, IRON, RETICCTPCT in the last 72 hours. No results for input(s): LIPASE, AMYLASE in the last 72 hours.  Urine Studies No results for input(s): UHGB, CRYS in the last 72 hours.  Invalid input(s): UACOL, UAPR, USPG, UPH, UTP, UGL, UKET, UBIL, UNIT, UROB, ULEU, UEPI, UWBC, URBC, UBAC, CAST, UCOM, BILUA  MICROBIOLOGY: No results found for this or any previous visit (from the past 240 hour(s)).  RADIOLOGY STUDIES/RESULTS: Ct Head Wo Contrast  08/23/2015  CLINICAL DATA:  Slurred speech. EXAM: CT HEAD  WITHOUT CONTRAST TECHNIQUE: Contiguous axial images were obtained from the base of the skull through the vertex without intravenous contrast. COMPARISON:  None. FINDINGS: There are postsurgical changes from a small craniectomy in the right parietal calvarium. There is a small well corticated focal defect in the medial left parietal calvarium, reportedly the sequela of childhood injury per discussion with the ordering provider. No evidence of parenchymal hemorrhage or extra-axial fluid collection. No mass lesion, mass effect, or midline shift. No CT evidence of acute infarction. There is intracranial atherosclerosis. Nonspecific subcortical and periventricular white matter hypodensity, most in keeping with chronic small vessel ischemic change. Cerebral volume is age appropriate. No ventriculomegaly. The visualized paranasal sinuses are essentially clear. The mastoid air cells are unopacified. No evidence of calvarial fracture. IMPRESSION: 1.  No evidence of acute intracranial abnormality. 2. Intracranial atherosclerosis and chronic small vessel ischemic white matter change. These results were  called by telephone at the time of interpretation on 08/23/2015 at 4:22 pm to Dr. Lavon Paganini, who verbally acknowledged these results. Electronically Signed   By: Delbert Phenix M.D.   On: 08/23/2015 16:29   Mr Brain Wo Contrast  08/23/2015  CLINICAL DATA:  Episode of slurred speech and blurred vision this afternoon. EXAM: MRI HEAD WITHOUT CONTRAST MRA HEAD WITHOUT CONTRAST TECHNIQUE: Multiplanar, multiecho pulse sequences of the brain and surrounding structures were obtained without intravenous contrast. Angiographic images of the head were obtained using MRA technique without contrast. COMPARISON:  CT of the head without contrast from the same day. FINDINGS: MRI HEAD FINDINGS The diffusion-weighted images demonstrate an acute nonhemorrhagic infarct in the posterior right temporal lobe. A punctate subcortical white matter  infarct in the anterior right frontal lobe is also suspected. No acute infarct is evident. Moderate atrophy and diffuse white matter changes are present bilaterally. Remote lacunar infarcts are present in the thalami and basal ganglia bilaterally. The brainstem is within normal limits. Mild cerebellar atrophy is present. Flow is present in the major intracranial arteries. The globes and orbits are intact. The paranasal sinuses and mastoid air cells are clear. MRA HEAD FINDINGS Mild atherosclerotic irregularity is present throughout the cavernous internal carotid arteries bilaterally without significant stenosis. There is focal signal loss in the distal A1 segments bilaterally suggesting moderate stenosis. Additional left greater than right distal ACA stenoses are present. There is a high-grade stenosis in the distal left M1 segment. Mild narrowing is present in the distal right M1 segment. There is significant attenuation of MCA branch vessels bilaterally, likely exaggerated by significant patient motion. The vertebral arteries are codominant. The PICA origins are not visualized. The basilar artery is intact. Both posterior cerebral arteries originate from the basilar tip. Mild to moderate proximal PCA stenoses are evident bilaterally. IMPRESSION: 1. Acute nonhemorrhagic posterior temporal lobe infarct. 2. Moderate atrophy and advanced confluent periventricular white matter disease suggesting chronic microvascular ischemia. 3. Diffuse intracranial atherosclerotic changes. 4. Moderate stenoses of the distal A1 segments bilaterally. 5. Moderate to high-grade stenosis of the distal left M1 segment. 6. Mild to moderate proximal PCA stenoses bilaterally. These results will be called to the ordering clinician or representative by the Radiologist Assistant, and communication documented in the PACS or zVision Dashboard. Electronically Signed   By: Marin Roberts M.D.   On: 08/23/2015 19:11   Mr Maxine Glenn Head/brain Wo  Cm  08/23/2015  CLINICAL DATA:  Episode of slurred speech and blurred vision this afternoon. EXAM: MRI HEAD WITHOUT CONTRAST MRA HEAD WITHOUT CONTRAST TECHNIQUE: Multiplanar, multiecho pulse sequences of the brain and surrounding structures were obtained without intravenous contrast. Angiographic images of the head were obtained using MRA technique without contrast. COMPARISON:  CT of the head without contrast from the same day. FINDINGS: MRI HEAD FINDINGS The diffusion-weighted images demonstrate an acute nonhemorrhagic infarct in the posterior right temporal lobe. A punctate subcortical white matter infarct in the anterior right frontal lobe is also suspected. No acute infarct is evident. Moderate atrophy and diffuse white matter changes are present bilaterally. Remote lacunar infarcts are present in the thalami and basal ganglia bilaterally. The brainstem is within normal limits. Mild cerebellar atrophy is present. Flow is present in the major intracranial arteries. The globes and orbits are intact. The paranasal sinuses and mastoid air cells are clear. MRA HEAD FINDINGS Mild atherosclerotic irregularity is present throughout the cavernous internal carotid arteries bilaterally without significant stenosis. There is focal signal loss in the distal A1 segments bilaterally  suggesting moderate stenosis. Additional left greater than right distal ACA stenoses are present. There is a high-grade stenosis in the distal left M1 segment. Mild narrowing is present in the distal right M1 segment. There is significant attenuation of MCA branch vessels bilaterally, likely exaggerated by significant patient motion. The vertebral arteries are codominant. The PICA origins are not visualized. The basilar artery is intact. Both posterior cerebral arteries originate from the basilar tip. Mild to moderate proximal PCA stenoses are evident bilaterally. IMPRESSION: 1. Acute nonhemorrhagic posterior temporal lobe infarct. 2. Moderate  atrophy and advanced confluent periventricular white matter disease suggesting chronic microvascular ischemia. 3. Diffuse intracranial atherosclerotic changes. 4. Moderate stenoses of the distal A1 segments bilaterally. 5. Moderate to high-grade stenosis of the distal left M1 segment. 6. Mild to moderate proximal PCA stenoses bilaterally. These results will be called to the ordering clinician or representative by the Radiologist Assistant, and communication documented in the PACS or zVision Dashboard. Electronically Signed   By: Marin Roberts M.D.   On: 08/23/2015 19:11    Jeoffrey Massed, MD  [facility] Pager:[phone]  If 7PM-7AM, please contact night-coverage [email or web address] Password [email or web address] 08/26/2015, 4:24 PM   LOS: 2 days

## 2015-08-27 ENCOUNTER — Other Ambulatory Visit: Payer: Self-pay | Admitting: Neurology

## 2015-08-27 DIAGNOSIS — E669 Obesity, unspecified: Secondary | ICD-10-CM

## 2015-08-27 DIAGNOSIS — F1910 Other psychoactive substance abuse, uncomplicated: Secondary | ICD-10-CM

## 2015-08-27 DIAGNOSIS — I739 Peripheral vascular disease, unspecified: Secondary | ICD-10-CM

## 2015-08-27 DIAGNOSIS — I639 Cerebral infarction, unspecified: Secondary | ICD-10-CM

## 2015-08-27 MED ORDER — APIXABAN 5 MG PO TABS
5.0000 mg | ORAL_TABLET | Freq: Two times a day (BID) | ORAL | Status: DC
  Administered 2015-08-27: 5 mg via ORAL
  Filled 2015-08-27: qty 1

## 2015-08-27 MED ORDER — APIXABAN 5 MG PO TABS: 5.0000 mg | ORAL_TABLET | Freq: Two times a day (BID) | ORAL | Status: DC

## 2015-08-27 MED ORDER — DILTIAZEM HCL ER COATED BEADS 240 MG PO CP24: 240.0000 mg | ORAL_CAPSULE | Freq: Every day | ORAL | Status: DC

## 2015-08-27 NOTE — Discharge Instructions (Signed)
Follow with Primary MD  Jackie Plum, MD, Dr Pearlean Brownie (Neurology) and Primary Cardiologist.   Please get a complete blood count and chemistry panel checked by your Primary MD at your next visit, and again as instructed by your Primary MD.  You have been started on Blood thinners-please seek immediate medical attention if you have severe headache, stroke like symptoms, black/bloody stools, vomit blood or get involved in MVA/Trauma/head injury/fall.   AVOID ANY FURTHER ETOH/COCAINE AND MARIJUANA USE  Get Medicines reviewed and adjusted. Please take all your medications with you for your next visit with your Primary MD  Please request your Primary MD to go over all hospital tests and procedure/radiological results at the follow up, please ask your Primary MD to get all Hospital records sent to his/her office.  If you experience worsening of your admission symptoms, develop shortness of breath, life threatening emergency, suicidal or homicidal thoughts you must seek medical attention immediately by calling 911 or calling your MD immediately  if symptoms less severe.  You must read complete instructions/literature along with all the possible adverse reactions/side effects for all the Medicines you take and that have been prescribed to you. Take any new Medicines after you have completely understood and accpet all the possible adverse reactions/side effects.   Do not drive when taking Pain medications or sleeping medications (Benzodaizepines)  Do not take more than prescribed Pain, Sleep and Anxiety Medications  Special Instructions: If you have smoked or chewed Tobacco  in the last 2 yrs please stop smoking, stop any regular Alcohol  and or any Recreational drug use.  Wear Seat belts while driving.  Please note  You were cared for by a hospitalist during your hospital stay. Once you are discharged, your primary care physician will handle any further medical issues. Please note that NO REFILLS  for any discharge medications will be authorized once you are discharged, as it is imperative that you return to your primary care physician (or establish a relationship with a primary care physician if you do not have one) for your aftercare needs so that they can reassess your need for medications and monitor your lab values.

## 2015-08-27 NOTE — Progress Notes (Signed)
Shift progressed uneventfully. Patient slept well overnight and denied any pain. Neurologic assessment revealed no evidence of focal neurologic deficits. Manual muscle testing revealed full and symmetric, 5/5 upper and lower extremity strength. There is evidence to indicate motor and functional recovery to baseline level. Patient made occasional ambulation attempts to the bathroom, without evidence of gait or balance impairment. Vital signs remained stable overnight.

## 2015-08-27 NOTE — Care Management Note (Signed)
Case Management Note  Patient Details  Name: Kevin [name] MRN: [id number] Date of Birth: 19-Nov-1950  Subjective/Objective:                  CVA  Action/Plan: CM spoke with patient at the bedside and provided him with an Eliquis $10 co-pay card.   Expected Discharge Date:       08/27/15           Expected Discharge Plan:  Home/Self Care  In-House Referral:     Discharge planning Services  CM Consult, Medication Assistance  Post Acute Care Choice:    Choice offered to:     DME Arranged:    DME Agency:     HH Arranged:    HH Agency:     Status of Service:  Completed, signed off  Medicare Important Message Given:    Date Medicare IM Given:    Medicare IM give by:    Date Additional Medicare IM Given:    Additional Medicare Important Message give by:     If discussed at Long Length of Stay Meetings, dates discussed:    Additional Comments:  Antony Haste, RN 08/27/2015, 12:22 PM

## 2015-08-27 NOTE — Progress Notes (Signed)
ANTICOAGULATION CONSULT NOTE - Initial Consult  Pharmacy Consult for Eliquis Indication: atrial fibrillation  No Known Allergies  Patient Measurements: Height: 6\' 6"  (198.1 cm) Weight: 198 lb 13.7 oz (90.2 kg) IBW/kg (Calculated) : 91.4  Vital Signs: Temp: 97.9 F (36.6 C) (12/04 0616) Temp Source: Oral (12/04 0616) BP: 156/96 mmHg (12/04 0616) Pulse Rate: 76 (12/04 0616)  Labs:  Recent Labs  08/26/15 0435 08/26/15 1108  HGB  --  15.0  HCT  --  44.0  PLT  --  88*  CREATININE 1.50*  --     Estimated Creatinine Clearance: 64.3 mL/min (by C-G formula based on Cr of 1.5).   Medical History: Past Medical History  Diagnosis Date  . PAD (peripheral artery disease) (HCC)   . Hypertension   . Cocaine abuse   . ETOH abuse   . Tobacco abuse   . Cholelithiases 05/01/2012  . Dysrhythmia     afib  . CHF (congestive heart failure) (HCC)   . Blood transfusion without reported diagnosis   . Hepatitis C antibody test positive 04/2012  . Claudication (HCC)     bilateral  . Hyperpigmentation   . Thrombocytopenia (HCC)   . Persistent atrial fibrillation (HCC) 08/23/2014    Medications:  Prescriptions prior to admission  Medication Sig Dispense Refill Last Dose  . aspirin EC 81 MG tablet Take 81 mg by mouth daily.   08/22/2015 at Unknown time  . carvedilol (COREG) 25 MG tablet Take 1 tablet (25 mg total) by mouth 2 (two) times daily. 180 tablet 2 08/22/2015 at 0800  . folic acid (FOLVITE) 1 MG tablet Take 1 tablet (1 mg total) by mouth daily. Need appointment before anymore refills 30 tablet 1 08/22/2015 at Unknown time  . [DISCONTINUED] diltiazem (CARDIZEM CD) 240 MG 24 hr capsule Take 1 capsule (240 mg total) by mouth daily. (Patient not taking: Reported on 08/23/2015) 90 capsule 2 Not Taking at Unknown time  . [DISCONTINUED] isosorbide mononitrate (IMDUR) 30 MG 24 hr tablet Take 1 tablet (30 mg total) by mouth daily. (Patient not taking: Reported on 08/23/2015) 90 tablet 2 Not  Taking at Unknown time    Assessment: 64 y/o M admitted 08/23/2015 for CVA. Patient does have a h/o afib but was not on anticoagulation PTA. Taken off Xarelto in 2013 due to thrombocytopenia. LDL 112, A1C 5.4, +cocaine +tobacco, EF 55-60%. MD notes: Further discussed with wife who prefers to go back on NOACs due to new stroke. We recommend eliquis and pt was educated on medication compliance.  PMH: Afib, polysubstance abuse (+cocaine, tobacco, alcohol, THC), Hec C, thrombocytopenia, alcoholic and Hep C liver cirrhosis, CKDIII, HTN, noncompliance  Goal of Therapy:  Therapeutic oral anticoagulation Monitor platelets by anticoagulation protocol: Yes   Plan:  Eliquis 5mg  BID Monitor thrombocytopenia  Kanishk Stroebel S. Merilynn Finland, PharmD, BCPS Clinical Staff Pharmacist Pager [phone]  Misty Stanley Stillinger 08/27/2015,9:51 AM

## 2015-08-27 NOTE — Progress Notes (Signed)
STROKE TEAM PROGRESS NOTE   HISTORY Kevin [name] is an 64 y.o. male male with history of right craniotomy who seemed to be normal at 1000 hours (LKW 08/23/2015 at 10 AM). When wife called him at 1500 hours she thought his speech was slurred and he was complaining of blurred vision. On arrival to ED his speech cleared and his blurred vision cleared. Currently he has a possible left field cut but he states this may be old. patient has known Afib but is only on ASA. Patient was not administered TPA secondary to out of the window. He was admitted for further evaluation and treatment.   SUBJECTIVE (INTERVAL HISTORY) No family members present. Pt stated that his wife decided that he should take eliquis for stroke prevention. Confirmed from Dr. Jerral Ralph that he will be put on eliquis on discharge. Bleeding precautions.  OBJECTIVE Temp:  [97.9 F (36.6 C)-98.5 F (36.9 C)] 97.9 F (36.6 C) (12/04 0616) Pulse Rate:  [71-84] 76 (12/04 0616) Cardiac Rhythm:  [-] Atrial fibrillation (12/04 0705) Resp:  [18] 18 (12/04 0616) BP: (148-166)/(87-96) 156/96 mmHg (12/04 0616) SpO2:  [97 %-100 %] 100 % (12/04 0616)  CBC:   Recent Labs Lab 08/23/15 1619 08/23/15 1630 08/26/15 1108  WBC 7.3  --  7.2  NEUTROABS 5.7  --   --   HGB 14.2 16.0 15.0  HCT 41.6 47.0 44.0  MCV 99.5  --  99.3  PLT 95*  --  88*    Basic Metabolic Panel:   Recent Labs Lab 08/24/15 0530 08/26/15 0435  NA 136 137  K 3.3* 3.8  CL 104 107  CO2 24 23  GLUCOSE 104* 103*  BUN 11 21*  CREATININE 1.16 1.50*  CALCIUM 8.7* 8.7*    Lipid Panel:     Component Value Date/Time   CHOL 182 08/24/2015 0530   TRIG 64 08/24/2015 0530   HDL 57 08/24/2015 0530   CHOLHDL 3.2 08/24/2015 0530   VLDL 13 08/24/2015 0530   LDLCALC 112* 08/24/2015 0530   HgbA1c:  Lab Results  Component Value Date   HGBA1C 5.1 08/24/2015   Urine Drug Screen:     Component Value Date/Time   LABOPIA NONE DETECTED 08/24/2015 1703    COCAINSCRNUR POSITIVE* 08/24/2015 1703   LABBENZ NONE DETECTED 08/24/2015 1703   AMPHETMU NONE DETECTED 08/24/2015 1703   THCU POSITIVE* 08/24/2015 1703   LABBARB NONE DETECTED 08/24/2015 1703      IMAGING I have personally reviewed the radiological images below and agree with the radiology interpretations.  Ct Head Wo Contrast 08/23/2015   1.  No evidence of acute intracranial abnormality. 2. Intracranial atherosclerosis and chronic small vessel ischemic white matter change.   Mri & Mra Brain Wo Contrast 08/23/2015    1. Acute nonhemorrhagic posterior temporal lobe infarct.  2. Moderate atrophy and advanced confluent periventricular white matter disease suggesting chronic microvascular ischemia. 3. Diffuse intracranial atherosclerotic changes.  4. Moderate stenoses of the distal A1 segments bilaterally.  5. Moderate to high-grade stenosis of the distal left M1 segment.  6. Mild to moderate proximal PCA stenoses bilaterally.   Carotid ultrasound 08/26/2015 There is no obvious evidence of hemodynamically significant stenosis involving the right internal carotid artery. However, the right ICA exhibits atypical, high resistant waveforms suggestive of a possible distal obstruction versus unknown etiology. Unable to visualize the right distal ICA due to technical limitations. Left internal carotid artery evaluation is suggestive of 1-39% stenosis. Vertebral arteries are patent with antegrade flow bilaterally.  CTA neck  08/26/2015 Non stenotic atheromatous change at both carotid bifurcations extending into the proximal cervical ICA segments. No flow reducing lesion is evident. BILATERAL ostial vertebral disease, potentially flow reducing on the LEFT, but both vertebrals contribute to formation of the basilar.  2D echo - - Left ventricle: The cavity size was normal. There was mild concentric hypertrophy. Systolic function was normal. The estimated ejection fraction was in the range of  55% to 60%. Wall motion was normal; there were no regional wall motion abnormalities. - Left atrium: The atrium was moderately dilated.   PHYSICAL EXAM [name] not in distress. . Afebrile. Head is nontraumatic. Neck is supple without bruit.    Cardiac exam irregularly irregular heart rate and rhythm. Lungs are clear to auscultation. Distal pulses are well felt. Neurological Exam: Awake  Alert oriented x 3. Normal speech and language.eye movements full without nystagmus.fundi were not visualized. Vision acuity and fields appear normal. Hearing is normal. Palatal movements are normal. Face symmetric. Tongue midline. Normal strength, tone, reflexes and coordination. Normal sensation. Gait deferred.  ASSESSMENT/PLAN [name] is a 64 y.o. male with history of right craniotomy, hypertension, tobacco and cocaine use, PAD, CHF, CAD, cigarette smoking, thrombocytopenia presenting with slurred speech and blurred vision. He did not receive IV t-PA due to delay in arrival.   Stroke:  right posterior temporal lobe infarct embolic secondary to known atrial fibrillation. Cocaine use may be contributing too.   MRI  Right posterior temporal lobe infarct.   MRA  Moderate to high-grade stenosis distal left M1. Moderate bilateral PCA and distal bilateral A1 segment stenoses  CTA neck - L VA stenosis  Carotid Doppler right ICA high resistance waveform but CTA neck no stenosis  2D Echo - EF 55-60%.   LDL 112  HgbA1c 5.4  SCDs for VTE prophylaxis Diet Heart Room service appropriate?: Yes; Fluid consistency:: Thin Diet - low sodium heart healthy Diet - low sodium heart healthy  aspirin 81 mg daily prior to admission, now on eliquis 5mg  bid after discussion with wife. Wife is aware of high risk of bleeding for him but also understands the benefit of stroke prevention.   Ongoing aggressive stroke risk factor management  Therapy recommendations:  Outpatient  OT  Disposition:  Discharge to home today  Atrial Fibrillation with RVR  Home anticoagulation:  none  Not on anticoagulation prior to admission due to thrombocytopenia and medication noncompliance as well as substance abuse, taken off Xarelto.  Dr. Clifton James wrote "Per records, on Xarelto in past but stopped due to thrombocytopenia. Not a good coumadin candidate due to non-compliance."  Further discussed with wife who prefers to go back on NOACs due to new stroke. Started on eliquis 5mg  bid and pt was educated on medication compliance.  Bleeding precautions.  Hypertension  Elevated Permissive hypertension (OK if < 220/120) but gradually normalize in 5-7 days.  Hyperlipidemia  Home meds:  No statin  LDL 112, goal < 70  Crestor 20 mg added  Continue statin at discharge  Other Stroke Risk Factors  Cocaine use, urine drug screen positive this admission  Cigarette smoker, advised to stop smoking  History of alcohol abuse, decreased intake at this time, confirmed by wife  Marijuana use, urine drug screen positive on admission  PAD Status post right leg stent placement  Chronic diastolic heart failure  Carotid artery stenosis status post carotid artery angioplasty  Other Active Problems  Hepatitis C  Thrombocytopenia, Platelet 95 -> 88. PER HISTORY,  PATIENT WITH THROMBOCYTOPENIA ON ADMISSION 04/30/2012 OUT TO BE SECONDARY TO xARELTO. pLATELETS NOT IMPROVED IN 2015.  Liver cirrhosis secondary to alcohol and hepatitis C, compensated.   Chronic kidney disease stage III  Mild renal insufficiency.  Hospital day # 3  Neurology will sign off. Please call with questions. Pt will follow up with Dr. Roda Shutters at [facility] in about 2 months. Thanks for the consult.  Marvel Plan, MD PhD Stroke Neurology 08/27/2015 4:21 PM   To contact Stroke Continuity provider, please refer to [email or web address]. After hours, contact General Neurology

## 2015-08-27 NOTE — Progress Notes (Signed)
Seen and examined. Spoke with patient at bedside-then with spouse over the phone-they have had a chance to think/discussion about Anticoagulation vs ASA-after our long discussion yesterday. They would like to try Eliquis-as noted yesterday-they are aware that patient is a high risk candidate-they accept all risks-they are very aware of catastrophic bleeding consequences which could be life threatening or life disabling.

## 2015-11-03 ENCOUNTER — Ambulatory Visit: Payer: Self-pay | Admitting: Neurology

## 2015-11-06 ENCOUNTER — Encounter: Payer: Self-pay | Admitting: Neurology

## 2015-11-23 ENCOUNTER — Encounter: Payer: Self-pay | Admitting: Hematology

## 2015-12-01 ENCOUNTER — Telehealth: Payer: Self-pay | Admitting: Hematology

## 2015-12-01 NOTE — Telephone Encounter (Signed)
Verified address, pt has new insurance will bring in on appt day. Faxed referring office  Scheduled intake

## 2015-12-05 ENCOUNTER — Ambulatory Visit: Payer: BLUE CROSS/BLUE SHIELD | Admitting: Hematology

## 2015-12-11 ENCOUNTER — Ambulatory Visit: Payer: BLUE CROSS/BLUE SHIELD | Admitting: Hematology

## 2016-01-31 ENCOUNTER — Inpatient Hospital Stay (HOSPITAL_COMMUNITY): Payer: Managed Care, Other (non HMO)

## 2016-01-31 ENCOUNTER — Emergency Department (HOSPITAL_COMMUNITY): Payer: Managed Care, Other (non HMO)

## 2016-01-31 ENCOUNTER — Encounter (HOSPITAL_COMMUNITY): Payer: Self-pay | Admitting: Emergency Medicine

## 2016-01-31 ENCOUNTER — Inpatient Hospital Stay (HOSPITAL_COMMUNITY)
Admission: EM | Admit: 2016-01-31 | Discharge: 2016-02-09 | DRG: 100 | Disposition: A | Discharge status: Home / Self Care | Payer: Managed Care, Other (non HMO) | Attending: Internal Medicine | Admitting: Internal Medicine

## 2016-01-31 DIAGNOSIS — I5021 Acute systolic (congestive) heart failure: Secondary | ICD-10-CM | POA: Diagnosis not present

## 2016-01-31 DIAGNOSIS — R7989 Other specified abnormal findings of blood chemistry: Secondary | ICD-10-CM | POA: Diagnosis not present

## 2016-01-31 DIAGNOSIS — J9690 Respiratory failure, unspecified, unspecified whether with hypoxia or hypercapnia: Secondary | ICD-10-CM

## 2016-01-31 DIAGNOSIS — Z8673 Personal history of transient ischemic attack (TIA), and cerebral infarction without residual deficits: Secondary | ICD-10-CM | POA: Diagnosis not present

## 2016-01-31 DIAGNOSIS — F1910 Other psychoactive substance abuse, uncomplicated: Secondary | ICD-10-CM | POA: Diagnosis not present

## 2016-01-31 DIAGNOSIS — Z91148 Patient's other noncompliance with medication regimen for other reason: Secondary | ICD-10-CM

## 2016-01-31 DIAGNOSIS — Z7901 Long term (current) use of anticoagulants: Secondary | ICD-10-CM | POA: Diagnosis not present

## 2016-01-31 DIAGNOSIS — R569 Unspecified convulsions: Secondary | ICD-10-CM | POA: Diagnosis not present

## 2016-01-31 DIAGNOSIS — E870 Hyperosmolality and hypernatremia: Secondary | ICD-10-CM | POA: Diagnosis present

## 2016-01-31 DIAGNOSIS — I482 Chronic atrial fibrillation: Secondary | ICD-10-CM | POA: Diagnosis not present

## 2016-01-31 DIAGNOSIS — I25111 Atherosclerotic heart disease of native coronary artery with angina pectoris with documented spasm: Secondary | ICD-10-CM

## 2016-01-31 DIAGNOSIS — I739 Peripheral vascular disease, unspecified: Secondary | ICD-10-CM | POA: Diagnosis present

## 2016-01-31 DIAGNOSIS — Z9289 Personal history of other medical treatment: Secondary | ICD-10-CM

## 2016-01-31 DIAGNOSIS — F17210 Nicotine dependence, cigarettes, uncomplicated: Secondary | ICD-10-CM | POA: Diagnosis present

## 2016-01-31 DIAGNOSIS — Z978 Presence of other specified devices: Secondary | ICD-10-CM

## 2016-01-31 DIAGNOSIS — N189 Chronic kidney disease, unspecified: Secondary | ICD-10-CM | POA: Diagnosis present

## 2016-01-31 DIAGNOSIS — R739 Hyperglycemia, unspecified: Secondary | ICD-10-CM | POA: Diagnosis present

## 2016-01-31 DIAGNOSIS — B1920 Unspecified viral hepatitis C without hepatic coma: Secondary | ICD-10-CM | POA: Diagnosis present

## 2016-01-31 DIAGNOSIS — J9601 Acute respiratory failure with hypoxia: Secondary | ICD-10-CM | POA: Diagnosis not present

## 2016-01-31 DIAGNOSIS — N179 Acute kidney failure, unspecified: Secondary | ICD-10-CM | POA: Diagnosis present

## 2016-01-31 DIAGNOSIS — G40901 Epilepsy, unspecified, not intractable, with status epilepticus: Secondary | ICD-10-CM | POA: Diagnosis present

## 2016-01-31 DIAGNOSIS — F1410 Cocaine abuse, uncomplicated: Secondary | ICD-10-CM | POA: Diagnosis present

## 2016-01-31 DIAGNOSIS — I998 Other disorder of circulatory system: Secondary | ICD-10-CM | POA: Diagnosis not present

## 2016-01-31 DIAGNOSIS — I481 Persistent atrial fibrillation: Secondary | ICD-10-CM

## 2016-01-31 DIAGNOSIS — G9340 Encephalopathy, unspecified: Secondary | ICD-10-CM | POA: Diagnosis not present

## 2016-01-31 DIAGNOSIS — I25119 Atherosclerotic heart disease of native coronary artery with unspecified angina pectoris: Secondary | ICD-10-CM | POA: Diagnosis present

## 2016-01-31 DIAGNOSIS — R7689 Other specified abnormal immunological findings in serum: Secondary | ICD-10-CM | POA: Diagnosis present

## 2016-01-31 DIAGNOSIS — I429 Cardiomyopathy, unspecified: Secondary | ICD-10-CM | POA: Diagnosis present

## 2016-01-31 DIAGNOSIS — Z9114 Patient's other noncompliance with medication regimen: Secondary | ICD-10-CM

## 2016-01-31 DIAGNOSIS — I472 Ventricular tachycardia: Secondary | ICD-10-CM | POA: Diagnosis present

## 2016-01-31 DIAGNOSIS — E876 Hypokalemia: Secondary | ICD-10-CM | POA: Diagnosis present

## 2016-01-31 DIAGNOSIS — I214 Non-ST elevation (NSTEMI) myocardial infarction: Secondary | ICD-10-CM | POA: Diagnosis present

## 2016-01-31 DIAGNOSIS — D6959 Other secondary thrombocytopenia: Secondary | ICD-10-CM | POA: Diagnosis present

## 2016-01-31 DIAGNOSIS — K7460 Unspecified cirrhosis of liver: Secondary | ICD-10-CM | POA: Diagnosis present

## 2016-01-31 DIAGNOSIS — G40401 Other generalized epilepsy and epileptic syndromes, not intractable, with status epilepticus: Principal | ICD-10-CM | POA: Diagnosis present

## 2016-01-31 DIAGNOSIS — J690 Pneumonitis due to inhalation of food and vomit: Secondary | ICD-10-CM | POA: Diagnosis present

## 2016-01-31 DIAGNOSIS — I10 Essential (primary) hypertension: Secondary | ICD-10-CM | POA: Diagnosis present

## 2016-01-31 DIAGNOSIS — I5022 Chronic systolic (congestive) heart failure: Secondary | ICD-10-CM | POA: Diagnosis present

## 2016-01-31 DIAGNOSIS — I159 Secondary hypertension, unspecified: Secondary | ICD-10-CM | POA: Diagnosis present

## 2016-01-31 DIAGNOSIS — I480 Paroxysmal atrial fibrillation: Secondary | ICD-10-CM | POA: Diagnosis present

## 2016-01-31 DIAGNOSIS — G9341 Metabolic encephalopathy: Secondary | ICD-10-CM | POA: Diagnosis present

## 2016-01-31 DIAGNOSIS — I2510 Atherosclerotic heart disease of native coronary artery without angina pectoris: Secondary | ICD-10-CM | POA: Diagnosis not present

## 2016-01-31 DIAGNOSIS — I639 Cerebral infarction, unspecified: Secondary | ICD-10-CM | POA: Diagnosis present

## 2016-01-31 DIAGNOSIS — D696 Thrombocytopenia, unspecified: Secondary | ICD-10-CM | POA: Diagnosis present

## 2016-01-31 DIAGNOSIS — E8729 Other acidosis: Secondary | ICD-10-CM | POA: Insufficient documentation

## 2016-01-31 DIAGNOSIS — I1310 Hypertensive heart and chronic kidney disease without heart failure, with stage 1 through stage 4 chronic kidney disease, or unspecified chronic kidney disease: Secondary | ICD-10-CM | POA: Diagnosis present

## 2016-01-31 DIAGNOSIS — I4821 Permanent atrial fibrillation: Secondary | ICD-10-CM | POA: Diagnosis present

## 2016-01-31 DIAGNOSIS — I161 Hypertensive emergency: Secondary | ICD-10-CM | POA: Diagnosis present

## 2016-01-31 DIAGNOSIS — R768 Other specified abnormal immunological findings in serum: Secondary | ICD-10-CM | POA: Diagnosis present

## 2016-01-31 DIAGNOSIS — I4729 Other ventricular tachycardia: Secondary | ICD-10-CM

## 2016-01-31 DIAGNOSIS — F1010 Alcohol abuse, uncomplicated: Secondary | ICD-10-CM | POA: Diagnosis present

## 2016-01-31 DIAGNOSIS — E872 Acidosis, unspecified: Secondary | ICD-10-CM | POA: Insufficient documentation

## 2016-01-31 DIAGNOSIS — E874 Mixed disorder of acid-base balance: Secondary | ICD-10-CM | POA: Diagnosis present

## 2016-01-31 DIAGNOSIS — Z79899 Other long term (current) drug therapy: Secondary | ICD-10-CM

## 2016-01-31 DIAGNOSIS — R778 Other specified abnormalities of plasma proteins: Secondary | ICD-10-CM | POA: Diagnosis present

## 2016-01-31 HISTORY — DX: Atherosclerotic heart disease of native coronary artery without angina pectoris: I25.10

## 2016-01-31 HISTORY — DX: Permanent atrial fibrillation: I48.21

## 2016-01-31 LAB — I-STAT ARTERIAL BLOOD GAS, ED
Acid-base deficit: 16 mmol/L — ABNORMAL HIGH (ref 0.0–2.0)
Acid-base deficit: 7 mmol/L — ABNORMAL HIGH (ref 0.0–2.0)
Bicarbonate: 15.1 mEq/L — ABNORMAL LOW (ref 20.0–24.0)
Bicarbonate: 18.5 mEq/L — ABNORMAL LOW (ref 20.0–24.0)
O2 Saturation: 98 %
O2 Saturation: 92 %
Patient temperature: 98.2
Patient temperature: 98.4
TCO2: 17 mmol/L (ref 0–100)
TCO2: 20 mmol/L (ref 0–100)
pCO2 arterial: 55.8 mmHg — ABNORMAL HIGH (ref 35.0–45.0)
pCO2 arterial: 37.2 mmHg (ref 35.0–45.0)
pH, Arterial: 7.038 — CL (ref 7.350–7.450)
pH, Arterial: 7.303 — ABNORMAL LOW (ref 7.350–7.450)
pO2, Arterial: 150 mmHg — ABNORMAL HIGH (ref 80.0–100.0)
pO2, Arterial: 69 mmHg — ABNORMAL LOW (ref 80.0–100.0)

## 2016-01-31 LAB — COMPREHENSIVE METABOLIC PANEL
ALT: 30 U/L (ref 17–63)
AST: 41 U/L (ref 15–41)
Albumin: 4.6 g/dL (ref 3.5–5.0)
Alkaline Phosphatase: 124 U/L (ref 38–126)
Anion gap: 32 — ABNORMAL HIGH (ref 5–15)
BUN: 20 mg/dL (ref 6–20)
CO2: 13 mmol/L — ABNORMAL LOW (ref 22–32)
Calcium: 10.8 mg/dL — ABNORMAL HIGH (ref 8.9–10.3)
Chloride: 107 mmol/L (ref 101–111)
Creatinine, Ser: 2.13 mg/dL — ABNORMAL HIGH (ref 0.61–1.24)
GFR calc Af Amer: 36 mL/min — ABNORMAL LOW (ref >60)
GFR calc non Af Amer: 31 mL/min — ABNORMAL LOW (ref >60)
Glucose, Bld: 186 mg/dL — ABNORMAL HIGH (ref 65–99)
Potassium: 4.6 mmol/L (ref 3.5–5.1)
Sodium: 152 mmol/L — ABNORMAL HIGH (ref 135–145)
Total Bilirubin: 1.3 mg/dL — ABNORMAL HIGH (ref 0.3–1.2)
Total Protein: 8.8 g/dL — ABNORMAL HIGH (ref 6.5–8.1)

## 2016-01-31 LAB — URINALYSIS, ROUTINE W REFLEX MICROSCOPIC
Bilirubin Urine: NEGATIVE
Glucose, UA: 250 mg/dL — AB
Ketones, ur: NEGATIVE mg/dL
Leukocytes, UA: NEGATIVE
Nitrite: NEGATIVE
Protein, ur: >300 mg/dL — AB
Specific Gravity, Urine: 1.016 (ref 1.005–1.030)
pH: 5.5 (ref 5.0–8.0)

## 2016-01-31 LAB — I-STAT CG4 LACTIC ACID, ED: Lactic Acid, Venous: >17 mmol/L (ref 0.5–2.0)

## 2016-01-31 LAB — POCT I-STAT 3, ART BLOOD GAS (G3+)
Acid-base deficit: 4 mmol/L — ABNORMAL HIGH (ref 0.0–2.0)
Bicarbonate: 19.7 mEq/L — ABNORMAL LOW (ref 20.0–24.0)
O2 Saturation: 99 %
Patient temperature: 99.1
TCO2: 21 mmol/L (ref 0–100)
pCO2 arterial: 31.6 mmHg — ABNORMAL LOW (ref 35.0–45.0)
pH, Arterial: 7.404 (ref 7.350–7.450)
pO2, Arterial: 118 mmHg — ABNORMAL HIGH (ref 80.0–100.0)

## 2016-01-31 LAB — URINE MICROSCOPIC-ADD ON

## 2016-01-31 LAB — RAPID URINE DRUG SCREEN, HOSP PERFORMED
Amphetamines: NOT DETECTED
Barbiturates: NOT DETECTED
Benzodiazepines: NOT DETECTED
Cocaine: NOT DETECTED
Opiates: NOT DETECTED
Tetrahydrocannabinol: POSITIVE — AB

## 2016-01-31 LAB — GLUCOSE, CAPILLARY
Glucose-Capillary: 93 mg/dL (ref 65–99)
Glucose-Capillary: 105 mg/dL — ABNORMAL HIGH (ref 65–99)
Glucose-Capillary: 138 mg/dL — ABNORMAL HIGH (ref 65–99)

## 2016-01-31 LAB — CBC
HCT: 54.9 % — ABNORMAL HIGH (ref 39.0–52.0)
Hemoglobin: 17.6 g/dL — ABNORMAL HIGH (ref 13.0–17.0)
MCH: 33 pg (ref 26.0–34.0)
MCHC: 32.1 g/dL (ref 30.0–36.0)
MCV: 103 fL — ABNORMAL HIGH (ref 78.0–100.0)
Platelets: 75 10*3/uL — ABNORMAL LOW (ref 150–400)
RBC: 5.33 MIL/uL (ref 4.22–5.81)
RDW: 12.4 % (ref 11.5–15.5)
WBC: 12.1 10*3/uL — ABNORMAL HIGH (ref 4.0–10.5)

## 2016-01-31 LAB — POCT I-STAT, CHEM 8
BUN: 27 mg/dL — ABNORMAL HIGH (ref 6–20)
Calcium, Ion: 1.15 mmol/L (ref 1.13–1.30)
Chloride: 113 mmol/L — ABNORMAL HIGH (ref 101–111)
Creatinine, Ser: 1.8 mg/dL — ABNORMAL HIGH (ref 0.61–1.24)
Glucose, Bld: 165 mg/dL — ABNORMAL HIGH (ref 65–99)
HCT: 59 % — ABNORMAL HIGH (ref 39.0–52.0)
Hemoglobin: 20.1 g/dL — ABNORMAL HIGH (ref 13.0–17.0)
Potassium: 4.4 mmol/L (ref 3.5–5.1)
Sodium: 149 mmol/L — ABNORMAL HIGH (ref 135–145)
TCO2: 13 mmol/L (ref 0–100)

## 2016-01-31 LAB — MRSA PCR SCREENING: MRSA by PCR: NEGATIVE

## 2016-01-31 LAB — DIFFERENTIAL
Basophils Absolute: 0.1 10*3/uL (ref 0.0–0.1)
Basophils Relative: 1 %
Eosinophils Absolute: 0.1 10*3/uL (ref 0.0–0.7)
Eosinophils Relative: 1 %
Lymphocytes Relative: 20 %
Lymphs Abs: 2.4 10*3/uL (ref 0.7–4.0)
Monocytes Absolute: 1.8 10*3/uL — ABNORMAL HIGH (ref 0.1–1.0)
Monocytes Relative: 15 %
Neutro Abs: 7.7 10*3/uL (ref 1.7–7.7)
Neutrophils Relative %: 63 %

## 2016-01-31 LAB — LACTIC ACID, PLASMA
Lactic Acid, Venous: 7.1 mmol/L (ref 0.5–2.0)
Lactic Acid, Venous: 5.2 mmol/L (ref 0.5–2.0)
Lactic Acid, Venous: 3.7 mmol/L (ref 0.5–2.0)

## 2016-01-31 LAB — HEPARIN LEVEL (UNFRACTIONATED): Heparin Unfractionated: <0.1 IU/mL — ABNORMAL LOW (ref 0.30–0.70)

## 2016-01-31 LAB — TRIGLYCERIDES: Triglycerides: 1372 mg/dL — ABNORMAL HIGH (ref <150)

## 2016-01-31 LAB — I-STAT TROPONIN, ED: Troponin i, poc: 0.03 ng/mL (ref 0.00–0.08)

## 2016-01-31 LAB — ETHANOL: Alcohol, Ethyl (B): <5 mg/dL (ref <5)

## 2016-01-31 LAB — CBG MONITORING, ED: Glucose-Capillary: 187 mg/dL — ABNORMAL HIGH (ref 65–99)

## 2016-01-31 LAB — MAGNESIUM: Magnesium: 2.4 mg/dL (ref 1.7–2.4)

## 2016-01-31 LAB — OSMOLALITY: Osmolality: 291 mOsm/kg (ref 275–295)

## 2016-01-31 LAB — OSMOLALITY, URINE: Osmolality, Ur: 379 mOsm/kg (ref 300–900)

## 2016-01-31 LAB — APTT
aPTT: 26 seconds (ref 24–37)
aPTT: 31 seconds (ref 24–37)

## 2016-01-31 LAB — ACETAMINOPHEN LEVEL: Acetaminophen (Tylenol), Serum: <10 ug/mL — ABNORMAL LOW (ref 10–30)

## 2016-01-31 LAB — TROPONIN I
Troponin I: 0.75 ng/mL (ref <0.031)
Troponin I: 17.54 ng/mL (ref <0.031)

## 2016-01-31 LAB — PROTIME-INR
INR: 1.27 (ref 0.00–1.49)
Prothrombin Time: 16 seconds — ABNORMAL HIGH (ref 11.6–15.2)

## 2016-01-31 LAB — SALICYLATE LEVEL: Salicylate Lvl: <4 mg/dL (ref 2.8–30.0)

## 2016-01-31 MED ORDER — ROCURONIUM BROMIDE 50 MG/5ML IV SOLN
INTRAVENOUS | Status: DC | PRN
  Administered 2016-01-31: 80 mg via INTRAVENOUS

## 2016-01-31 MED ORDER — MIDAZOLAM HCL 5 MG/ML IJ SOLN
1.0000 mg/h | INTRAMUSCULAR | Status: DC
  Administered 2016-01-31: 4 mg/h via INTRAVENOUS
  Administered 2016-01-31: 5 mg/h via INTRAVENOUS
  Administered 2016-02-01 – 2016-02-02 (×3): 4 mg/h via INTRAVENOUS
  Administered 2016-02-03: 5 mg/h via INTRAVENOUS
  Filled 2016-01-31 (×8): qty 10

## 2016-01-31 MED ORDER — ASPIRIN 81 MG PO CHEW
81.0000 mg | CHEWABLE_TABLET | Freq: Every day | ORAL | Status: DC
  Administered 2016-01-31 – 2016-02-04 (×5): 81 mg
  Filled 2016-01-31 (×5): qty 1

## 2016-01-31 MED ORDER — FREE WATER
250.0000 mL | Freq: Four times a day (QID) | Status: DC
Start: 2016-01-31 — End: 2016-02-04
  Administered 2016-01-31 – 2016-02-04 (×16): 250 mL

## 2016-01-31 MED ORDER — LORAZEPAM 2 MG/ML IJ SOLN
2.0000 mg | Freq: Once | INTRAMUSCULAR | Status: AC
  Administered 2016-01-31: 2 mg via INTRAVENOUS

## 2016-01-31 MED ORDER — ANTISEPTIC ORAL RINSE SOLUTION (CORINZ)
7.0000 mL | OROMUCOSAL | Status: DC
  Administered 2016-01-31 – 2016-02-01 (×8): 7 mL via OROMUCOSAL

## 2016-01-31 MED ORDER — HEPARIN (PORCINE) IN NACL 100-0.45 UNIT/ML-% IJ SOLN
1350.0000 [IU]/h | INTRAMUSCULAR | Status: DC
  Administered 2016-01-31: 1200 [IU]/h via INTRAVENOUS
  Administered 2016-02-01 (×2): 1350 [IU]/h via INTRAVENOUS
  Administered 2016-02-02: 1200 [IU]/h via INTRAVENOUS
  Administered 2016-02-03 – 2016-02-06 (×4): 1350 [IU]/h via INTRAVENOUS
  Filled 2016-01-31 (×8): qty 250

## 2016-01-31 MED ORDER — SODIUM CHLORIDE 0.9 % IV SOLN
1500.0000 mg | Freq: Two times a day (BID) | INTRAVENOUS | Status: DC
  Administered 2016-01-31 – 2016-02-01 (×2): 1500 mg via INTRAVENOUS
  Filled 2016-01-31 (×3): qty 15

## 2016-01-31 MED ORDER — ROSUVASTATIN CALCIUM 20 MG PO TABS
20.0000 mg | ORAL_TABLET | Freq: Every day | ORAL | Status: DC
  Administered 2016-01-31 – 2016-02-04 (×5): 20 mg
  Filled 2016-01-31 (×5): qty 1

## 2016-01-31 MED ORDER — NICARDIPINE HCL IN NACL 20-0.86 MG/200ML-% IV SOLN
INTRAVENOUS | Status: AC
  Administered 2016-01-31: 5 mg
  Filled 2016-01-31: qty 200

## 2016-01-31 MED ORDER — HEPARIN SODIUM (PORCINE) 5000 UNIT/ML IJ SOLN: 5000.0000 [IU] | Freq: Three times a day (TID) | INTRAMUSCULAR | Status: DC

## 2016-01-31 MED ORDER — ACETAMINOPHEN 325 MG PO TABS
650.0000 mg | ORAL_TABLET | Freq: Four times a day (QID) | ORAL | Status: DC | PRN
Start: 2016-01-31 — End: 2016-02-04
  Administered 2016-01-31 – 2016-02-03 (×2): 650 mg via ORAL
  Filled 2016-01-31 (×2): qty 2

## 2016-01-31 MED ORDER — FENTANYL CITRATE (PF) 100 MCG/2ML IJ SOLN
100.0000 ug | INTRAMUSCULAR | Status: DC | PRN
  Filled 2016-01-31: qty 2

## 2016-01-31 MED ORDER — SODIUM CHLORIDE 0.9 % IV SOLN: 250.0000 mL | INTRAVENOUS | Status: DC | PRN

## 2016-01-31 MED ORDER — LEVETIRACETAM 500 MG/5ML IV SOLN
1000.0000 mg | Freq: Two times a day (BID) | INTRAVENOUS | Status: DC
  Administered 2016-01-31: 1000 mg via INTRAVENOUS
  Filled 2016-01-31 (×2): qty 10

## 2016-01-31 MED ORDER — THIAMINE HCL 100 MG/ML IJ SOLN
100.0000 mg | Freq: Every day | INTRAMUSCULAR | Status: DC
  Administered 2016-01-31 – 2016-02-01 (×2): 100 mg via INTRAVENOUS
  Filled 2016-01-31 (×2): qty 2

## 2016-01-31 MED ORDER — FENTANYL CITRATE (PF) 100 MCG/2ML IJ SOLN
100.0000 ug | INTRAMUSCULAR | Status: DC | PRN
  Administered 2016-02-01 – 2016-02-03 (×17): 100 ug via INTRAVENOUS
  Filled 2016-01-31 (×16): qty 2

## 2016-01-31 MED ORDER — PANTOPRAZOLE SODIUM 40 MG IV SOLR
40.0000 mg | Freq: Every day | INTRAVENOUS | Status: DC
  Administered 2016-01-31 – 2016-02-06 (×7): 40 mg via INTRAVENOUS
  Filled 2016-01-31 (×7): qty 40

## 2016-01-31 MED ORDER — VITAL AF 1.2 CAL PO LIQD
1000.0000 mL | ORAL | Status: DC
  Administered 2016-01-31 – 2016-02-04 (×8): 1000 mL

## 2016-01-31 MED ORDER — PROPOFOL 1000 MG/100ML IV EMUL
INTRAVENOUS | Status: AC
  Filled 2016-01-31: qty 100

## 2016-01-31 MED ORDER — NICARDIPINE HCL IN NACL 40-0.83 MG/200ML-% IV SOLN
3.0000 mg/h | INTRAVENOUS | Status: DC
  Administered 2016-01-31: 5 mg/h via INTRAVENOUS
  Administered 2016-01-31: 12.5 mg/h via INTRAVENOUS
  Filled 2016-01-31 (×2): qty 200

## 2016-01-31 MED ORDER — METOPROLOL TARTRATE 25 MG PO TABS
12.5000 mg | ORAL_TABLET | Freq: Two times a day (BID) | ORAL | Status: DC
  Administered 2016-01-31: 12.5 mg via ORAL
  Filled 2016-01-31: qty 1

## 2016-01-31 MED ORDER — INSULIN ASPART 100 UNIT/ML ~~LOC~~ SOLN
0.0000 [IU] | SUBCUTANEOUS | Status: DC
  Administered 2016-01-31: 3 [IU] via SUBCUTANEOUS
  Administered 2016-01-31 – 2016-02-01 (×2): 2 [IU] via SUBCUTANEOUS
  Administered 2016-02-01: 3 [IU] via SUBCUTANEOUS
  Administered 2016-02-01 – 2016-02-04 (×7): 2 [IU] via SUBCUTANEOUS
  Administered 2016-02-04: 3 [IU] via SUBCUTANEOUS
  Administered 2016-02-04 – 2016-02-06 (×8): 2 [IU] via SUBCUTANEOUS

## 2016-01-31 MED ORDER — SODIUM CHLORIDE 0.9 % IV SOLN: Freq: Once | INTRAVENOUS | Status: DC

## 2016-01-31 MED ORDER — LORAZEPAM 2 MG/ML IJ SOLN: 1.0000 mg | Freq: Once | INTRAMUSCULAR | Status: AC

## 2016-01-31 MED ORDER — LORAZEPAM 2 MG/ML IJ SOLN
INTRAMUSCULAR | Status: AC
  Filled 2016-01-31: qty 1

## 2016-01-31 MED ORDER — SODIUM ACETATE 2 MEQ/ML IV SOLN
INTRAVENOUS | Status: DC
  Filled 2016-01-31 (×2): qty 1000

## 2016-01-31 MED ORDER — METOPROLOL TARTRATE 5 MG/5ML IV SOLN
2.5000 mg | INTRAVENOUS | Status: DC | PRN
  Administered 2016-01-31 – 2016-02-05 (×8): 5 mg via INTRAVENOUS
  Filled 2016-01-31 (×9): qty 5

## 2016-01-31 MED ORDER — SODIUM CHLORIDE 0.9 % IV SOLN
INTRAVENOUS | Status: DC
  Administered 2016-01-31: 07:00:00 via INTRAVENOUS
  Administered 2016-01-31: 1000 mL via INTRAVENOUS
  Administered 2016-02-01 – 2016-02-02 (×2): via INTRAVENOUS
  Administered 2016-02-02: 100 mL/h via INTRAVENOUS
  Administered 2016-02-03 – 2016-02-04 (×2): via INTRAVENOUS
  Administered 2016-02-04: 1000 mL via INTRAVENOUS

## 2016-01-31 MED ORDER — LORAZEPAM 2 MG/ML IJ SOLN
INTRAMUSCULAR | Status: AC
  Administered 2016-01-31: 2 mg via INTRAVENOUS
  Filled 2016-01-31: qty 1

## 2016-01-31 MED ORDER — CHLORHEXIDINE GLUCONATE 0.12% ORAL RINSE (MEDLINE KIT)
15.0000 mL | Freq: Two times a day (BID) | OROMUCOSAL | Status: DC
  Administered 2016-01-31 – 2016-02-04 (×9): 15 mL via OROMUCOSAL

## 2016-01-31 MED ORDER — HEPARIN (PORCINE) IN NACL 100-0.45 UNIT/ML-% IJ SOLN
1200.0000 [IU]/h | INTRAMUSCULAR | Status: DC
  Filled 2016-01-31: qty 250

## 2016-01-31 MED ORDER — METOPROLOL TARTRATE 25 MG PO TABS
25.0000 mg | ORAL_TABLET | Freq: Two times a day (BID) | ORAL | Status: DC
  Administered 2016-01-31 – 2016-02-07 (×13): 25 mg via ORAL
  Filled 2016-01-31 (×14): qty 1

## 2016-01-31 MED ORDER — PROPOFOL 1000 MG/100ML IV EMUL
0.0000 ug/kg/min | INTRAVENOUS | Status: DC
  Administered 2016-01-31: 50 ug/kg/min via INTRAVENOUS
  Filled 2016-01-31 (×2): qty 100

## 2016-01-31 MED ORDER — SODIUM CHLORIDE 0.9 % IV SOLN
1000.0000 mg | Freq: Once | INTRAVENOUS | Status: AC
  Administered 2016-01-31: 1000 mg via INTRAVENOUS
  Filled 2016-01-31: qty 10

## 2016-01-31 MED ORDER — SODIUM CHLORIDE 0.9 % IV SOLN
3.0000 g | Freq: Three times a day (TID) | INTRAVENOUS | Status: DC
  Administered 2016-01-31 – 2016-02-06 (×19): 3 g via INTRAVENOUS
  Filled 2016-01-31 (×23): qty 3

## 2016-01-31 MED ORDER — ETOMIDATE 2 MG/ML IV SOLN
INTRAVENOUS | Status: DC | PRN
  Administered 2016-01-31: 30 mg via INTRAVENOUS

## 2016-01-31 MED ORDER — PROPOFOL 1000 MG/100ML IV EMUL
5.0000 ug/kg/min | Freq: Once | INTRAVENOUS | Status: AC
  Administered 2016-01-31: 10 ug/kg/min via INTRAVENOUS
  Filled 2016-01-31: qty 100

## 2016-01-31 MED ORDER — HEPARIN SODIUM (PORCINE) 5000 UNIT/ML IJ SOLN
5000.0000 [IU] | Freq: Three times a day (TID) | INTRAMUSCULAR | Status: DC
  Administered 2016-01-31 (×2): 5000 [IU] via SUBCUTANEOUS
  Filled 2016-01-31 (×2): qty 1

## 2016-01-31 MED ORDER — FOLIC ACID 5 MG/ML IJ SOLN
1.0000 mg | Freq: Every day | INTRAMUSCULAR | Status: DC
  Administered 2016-01-31: 1 mg via INTRAVENOUS
  Filled 2016-01-31 (×2): qty 0.2

## 2016-01-31 MED ORDER — LORAZEPAM 2 MG/ML IJ SOLN
INTRAMUSCULAR | Status: AC
  Administered 2016-01-31: 2 mg
  Filled 2016-01-31: qty 1

## 2016-01-31 MED ORDER — NICARDIPINE HCL IN NACL 20-0.86 MG/200ML-% IV SOLN
3.0000 mg/h | INTRAVENOUS | Status: DC
  Administered 2016-01-31: 2.5 mg/h via INTRAVENOUS
  Administered 2016-01-31: 12.5 mg/h via INTRAVENOUS
  Administered 2016-01-31: 7.5 mg/h via INTRAVENOUS
  Filled 2016-01-31 (×2): qty 200

## 2016-01-31 MED ORDER — PANTOPRAZOLE SODIUM 40 MG IV SOLR: 40.0000 mg | Freq: Every day | INTRAVENOUS | Status: DC

## 2016-01-31 MED ORDER — SODIUM CHLORIDE 0.9 % IV BOLUS (SEPSIS)
1000.0000 mL | Freq: Once | INTRAVENOUS | Status: AC
  Administered 2016-01-31: 1000 mL via INTRAVENOUS

## 2016-01-31 NOTE — Progress Notes (Signed)
Per MD, wean fio2 for sat >88% or above.  fio2 weaned to 60% for now.

## 2016-01-31 NOTE — Progress Notes (Signed)
CRITICAL VALUE ALERT  Critical value received:  Troponin 0.75  Date of notification:  01/31/16  Time of notification:  0800  Critical value read back:Yes.    Nurse who received alert:  Leo Grosser  MD notified (1st page):  Molli Knock   Time of first page:  0800  MD notified (2nd page):  Time of second page:  Responding MD:  Molli Knock  Time MD responded:  0800

## 2016-01-31 NOTE — Progress Notes (Signed)
eLink Physician-Brief Progress Note Patient Name: JEMAL GRIFFETH DOB: 1950/11/11 MRN: [id number]   Date of Service  01/31/2016  HPI/Events of Note  65 yo smoker presented with seizure, HTN (SBP 200's) A fib with RVR and VDRF.  CT head showed Rt occipital infarct w/o bleed.  He has hx of HTN, A fib, cocaine/ETOH/THC abuse.  Found to have progressively rising troponin.   eICU Interventions  Add ASA, heparin gtt.  Continue lopressor, crestor.  Repeat ECG.  Ordered Echo.  Will consult cardiology.      Intervention Category Major Interventions: Other:  Nikitta Sobiech 01/31/2016, 4:31 PM

## 2016-01-31 NOTE — Care Management Note (Signed)
Case Management Note  Patient Details  Name: Kevin [name] MRN: [id number] Date of Birth: 1950/12/25  Subjective/Objective:  Pt admitted on 01/31/16 with seizures.  PTA, pt independent, lives with spouse.                    Action/Plan: Pt remains sedated and on ventilator.  Will follow for discharge planning as pt progresses.    Expected Discharge Date:                  Expected Discharge Plan:  IP Rehab Facility  In-House Referral:     Discharge planning Services  CM Consult  Post Acute Care Choice:    Choice offered to:     DME Arranged:    DME Agency:     HH Arranged:    HH Agency:     Status of Service:  In process, will continue to follow  Medicare Important Message Given:    Date Medicare IM Given:    Medicare IM give by:    Date Additional Medicare IM Given:    Additional Medicare Important Message give by:     If discussed at Long Length of Stay Meetings, dates discussed:    Additional Comments:  Quintella Baton, RN, BSN  Trauma/Neuro ICU Case Manager [phone]

## 2016-01-31 NOTE — Progress Notes (Addendum)
ANTICOAGULATION CONSULT NOTE - Initial Consult  Pharmacy Consult for Heparin (while apixaban on hold) Indication: atrial fibrillation  No Known Allergies  Patient Measurements: Height: 5\' 11"  (180.3 cm) Weight: 198 lb (89.812 kg) IBW/kg (Calculated) : 75.3 Hepa Vital Signs: BP: 123/73 mmHg (05/10 0446) Pulse Rate: 39 (05/10 0446)  Labs:  Recent Labs  01/31/16 0300  HGB 17.6*  HCT 54.9*  PLT 75*  APTT 26  LABPROT 16.0*  INR 1.27  CREATININE 2.13*    Estimated Creatinine Clearance: 37.3 mL/min (by C-G formula based on Cr of 2.13).   Medical History: Past Medical History  Diagnosis Date  . PAD (peripheral artery disease) (HCC)   . Hypertension   . Cocaine abuse   . ETOH abuse   . Tobacco abuse   . Cholelithiases 05/01/2012  . Dysrhythmia     afib  . CHF (congestive heart failure) (HCC)   . Blood transfusion without reported diagnosis   . Hepatitis C antibody test positive 04/2012  . Claudication (HCC)     bilateral  . Hyperpigmentation   . Thrombocytopenia (HCC)   . Persistent atrial fibrillation (HCC) 08/23/2014    Assessment: 65 y/o M who came in as CODE STROKE, seizing upon arrival, pt is on apixaban PTA for afib, will be holding apixaban and starting heparin while intubated, will obtain baseline heparin level/aPTT before starting heparin at 1000 today. Will likely be using aPTT to dose for now given apixaban influence on anti-Xa levels.   Goal of Therapy:  Heparin level 0.3-0.5 units/mL APTT 66-84 secs Monitor platelets by anticoagulation protocol: Yes   Plan:  -Start heparin at 1200 units/hr at 1000 -1800 aPTT/HL -Daily CBC/HL/aPTT -Monitor for bleeding  Abran Duke 01/31/2016,5:21 AM  ============================= Addendum 5:36 AM CCM changing to heparin SQ for now [employer]

## 2016-01-31 NOTE — ED Provider Notes (Signed)
CSN: [id number]     Arrival date & time 01/31/16  0254 History  By signing my name below, I, Tanda Rockers, attest that this documentation has been prepared under the direction and in the presence of Laurence Spates, MD. Electronically Signed: Tanda Rockers, ED Scribe. 01/31/2016. 3:23 AM.   Chief Complaint  Patient presents with  . Code Stroke   LEVEL 5 CAVEAT pt unresponsive   The history is provided by the EMS personnel. No language interpreter was used.    HPI Comments: Kevin [name] is a 65 y.o. male brought in by ambulance, who presents to the Emergency Department for possible stroke. EMS reports that pt's wife woke up tonight and found pt vomiting in bed with incontinence, prompting her to call EMS. Upon arrival pt was no longer vomiting but was very diaphoretic. Pt was nonverbal and has continued to be so. EMS reports that when they arrived pt was attempting to tract with his eyes but has been slowly declining since. He had a left sided gaze en route but EMS reports that it is now a downward gaze. His right pupil was 5 mm and his left pupil was 3 mm. Pt had non purposeful movement on the right side but has had no movement on the left. He has also had shallow breathing but had 100% O2 saturation on RA. When pt arrived in the ED he began seizing. He does not have a hx of seizures but wife told EMS that he has hx of TIAs and atrial fibrillation.   Past Medical History  Diagnosis Date  . PAD (peripheral artery disease) (HCC)   . Hypertension   . Cocaine abuse   . ETOH abuse   . Tobacco abuse   . Cholelithiases 05/01/2012  . Dysrhythmia     afib  . CHF (congestive heart failure) (HCC)   . Blood transfusion without reported diagnosis   . Hepatitis C antibody test positive 04/2012  . Claudication (HCC)     bilateral  . Hyperpigmentation   . Thrombocytopenia (HCC)   . Persistent atrial fibrillation (HCC) 08/23/2014   Past Surgical History  Procedure Laterality Date  .  Endovascular stent insertion  right leg  . Tee without cardioversion  05/05/2012    Procedure: TRANSESOPHAGEAL ECHOCARDIOGRAM (TEE);  Surgeon: Thurmon Fair, MD;  Location: [facility] ENDOSCOPY;  Service: Cardiovascular;  Laterality: N/A;  . Cardioversion  05/05/2012    Procedure: CARDIOVERSION;  Surgeon: Thurmon Fair, MD;  Location: [facility] ENDOSCOPY;  Service: Cardiovascular;  Laterality: N/A;  . Cardiolite      negative for ischemia  . Carotid artery angioplasty    . Doppler echocardiography    . Cardiovascular stress test    . Left heart catheterization with coronary angiogram N/A 05/01/2012    Procedure: LEFT HEART CATHETERIZATION WITH CORONARY ANGIOGRAM;  Surgeon: Marykay Lex, MD;  Location: [facility] CATH LAB;  Service: Cardiovascular;  Laterality: N/A;  . Left heart catheterization with coronary angiogram N/A 09/27/2014    Procedure: LEFT HEART CATHETERIZATION WITH CORONARY ANGIOGRAM;  Surgeon: Chrystie Nose, MD;  Location: [facility] CATH LAB;  Service: Cardiovascular;  Laterality: N/A;   Family History  Problem Relation Age of Onset  . Seizures Daughter   . Cancer Mother    Social History  Substance Use Topics  . Smoking status: Current Every Day Smoker -- 0.25 packs/day for 40 years    Types: Cigarettes  . Smokeless tobacco: Never Used  . Alcohol Use: 0.0 - 9.6 oz/week  0-16 Standard drinks or equivalent per week     Comment: He used to drink up to 1/2 pint daily; 6 beers. Quit in March 2015    Review of Systems  Unable to perform ROS: Patient unresponsive      Allergies  Review of patient's allergies indicates no known allergies.  Home Medications   Prior to Admission medications   Medication Sig Start Date End Date Taking? Authorizing Provider  apixaban (ELIQUIS) 5 MG TABS tablet Take 1 tablet (5 mg total) by mouth 2 (two) times daily. 08/27/15   Shanker Levora Dredge, MD  diltiazem (CARDIZEM CD) 240 MG 24 hr capsule Take 1 capsule (240 mg total) by mouth daily. 08/27/15   Shanker Levora Dredge, MD  folic acid (FOLVITE) 1 MG tablet Take 1 tablet (1 mg total) by mouth daily. Need appointment before anymore refills 02/27/15   Chrystie Nose, MD  isosorbide mononitrate (IMDUR) 30 MG 24 hr tablet Take 1 tablet (30 mg total) by mouth daily. 08/25/15   Shanker Levora Dredge, MD  rosuvastatin (CRESTOR) 20 MG tablet Take 1 tablet (20 mg total) by mouth daily. 08/25/15   Shanker Levora Dredge, MD   Ht 6\' 1"  (1.854 m)  Wt 198 lb (89.812 kg)  BMI 26.13 kg/m2   Physical Exam  Constitutional: He appears well-developed and well-nourished. He appears distressed.  Unresponsive, shaking  HENT:  Head: Normocephalic and atraumatic.  Drooling, bite to tip of tongue  Eyes: Conjunctivae are normal. Pupils are equal, round, and reactive to light.  Rightward gaze deviation  Neck: Neck supple.  Cardiovascular: Normal heart sounds.  An irregularly irregular rhythm present. Tachycardia present.   No murmur heard. Pulmonary/Chest: Effort normal. No respiratory distress.  Coarse BS b/l  Abdominal: Soft. Bowel sounds are normal. He exhibits no distension.  Musculoskeletal: He exhibits no edema.  Neurological: He exhibits abnormal muscle tone.  Unresponsive, increased muscle tone on R side of body with rhythmic jerking suggestive of seizure, gaze deviation to R, L sided weakness  Skin: Skin is warm. He is diaphoretic.  Nursing note and vitals reviewed.   ED Course  .Critical Care Performed by: Laurence Spates Authorized by: Laurence Spates Total critical care time: 60 minutes Critical care time was exclusive of separately billable procedures and treating other patients. Critical care was necessary to treat or prevent imminent or life-threatening deterioration of the following conditions: CNS failure or compromise. Critical care was time spent personally by me on the following activities: discussions with consultants, evaluation of patient's response to treatment, examination of patient,  obtaining history from patient or surrogate, ordering and performing treatments and interventions, ordering and review of laboratory studies, ordering and review of radiographic studies, pulse oximetry, re-evaluation of patient's condition, review of old charts and ventilator management.   (including critical care time)  EMERGENT INTUBATION PROCEDURE NOTE INDICATION: impending airway compromise  TECHNIQUE: Unable to obtain consent because of emergent medical necessity.  After pre-oxygenating the patient for 5 minutes, a modified rapid-sequence induction was performed using etomidate and rocuronium with cricoid pressure. Using a Macintosh 4 laryngoscope blade and 8.1mm cuffed endotracheal tube was placed and secured.  Placement was confirmed with by auscultation, by CXR and ETCO2 monitor. COMPLICATIONS: None. The patient tolerated the procedure well with no complications. POST PROCEDURE CXR: ETT shallow; advanced 2cm     Labs Review Labs Reviewed  PROTIME-INR - Abnormal; Notable for the following:    Prothrombin Time 16.0 (*)    All other components within  normal limits  CBC - Abnormal; Notable for the following:    WBC 12.1 (*)    Hemoglobin 17.6 (*)    HCT 54.9 (*)    MCV 103.0 (*)    Platelets 75 (*)    All other components within normal limits  DIFFERENTIAL - Abnormal; Notable for the following:    Monocytes Absolute 1.8 (*)    All other components within normal limits  COMPREHENSIVE METABOLIC PANEL - Abnormal; Notable for the following:    Sodium 152 (*)    CO2 13 (*)    Glucose, Bld 186 (*)    Creatinine, Ser 2.13 (*)    Calcium 10.8 (*)    Total Protein 8.8 (*)    Total Bilirubin 1.3 (*)    GFR calc non Af Amer 31 (*)    GFR calc Af Amer 36 (*)    Anion gap 32 (*)    All other components within normal limits  URINE RAPID DRUG SCREEN, HOSP PERFORMED - Abnormal; Notable for the following:    Tetrahydrocannabinol POSITIVE (*)    All other components within normal limits   URINALYSIS, ROUTINE W REFLEX MICROSCOPIC (NOT AT [facility]) - Abnormal; Notable for the following:    Color, Urine AMBER (*)    APPearance CLOUDY (*)    Glucose, UA 250 (*)    Hgb urine dipstick LARGE (*)    Protein, ur >300 (*)    All other components within normal limits  ACETAMINOPHEN LEVEL - Abnormal; Notable for the following:    Acetaminophen (Tylenol), Serum <10 (*)    All other components within normal limits  LACTIC ACID, PLASMA - Abnormal; Notable for the following:    Lactic Acid, Venous 7.1 (*)    All other components within normal limits  TRIGLYCERIDES - Abnormal; Notable for the following:    Triglycerides 1372 (*)    All other components within normal limits  URINE MICROSCOPIC-ADD ON - Abnormal; Notable for the following:    Squamous Epithelial / LPF 0-5 (*)    Bacteria, UA RARE (*)    Casts HYALINE CASTS (*)    All other components within normal limits  CBG MONITORING, ED - Abnormal; Notable for the following:    Glucose-Capillary 187 (*)    All other components within normal limits  I-STAT CG4 LACTIC ACID, ED - Abnormal; Notable for the following:    Lactic Acid, Venous >17.00 (*)    All other components within normal limits  I-STAT ARTERIAL BLOOD GAS, ED - Abnormal; Notable for the following:    pH, Arterial 7.038 (*)    pCO2 arterial 55.8 (*)    pO2, Arterial 150.0 (*)    Bicarbonate 15.1 (*)    Acid-base deficit 16.0 (*)    All other components within normal limits  I-STAT ARTERIAL BLOOD GAS, ED - Abnormal; Notable for the following:    pH, Arterial 7.303 (*)    pO2, Arterial 69.0 (*)    Bicarbonate 18.5 (*)    Acid-base deficit 7.0 (*)    All other components within normal limits  URINE CULTURE  CULTURE, BLOOD (ROUTINE X 2)  CULTURE, BLOOD (ROUTINE X 2)  CULTURE, RESPIRATORY (NON-EXPECTORATED)  MRSA PCR SCREENING  ETHANOL  APTT  SALICYLATE LEVEL  OSMOLALITY, URINE  LACTIC ACID, PLASMA  LACTIC ACID, PLASMA  TROPONIN I  TROPONIN I  HEMOGLOBIN A1C   OSMOLALITY  I-STAT CHEM 8, ED  I-STAT TROPOININ, ED    Imaging Review Ct Head Wo Contrast  01/31/2016  CLINICAL DATA:  Altered mental status. EXAM: CT HEAD WITHOUT CONTRAST TECHNIQUE: Contiguous axial images were obtained from the base of the skull through the vertex without intravenous contrast. COMPARISON:  08/23/2015 FINDINGS: There is no intracranial hemorrhage or extra-axial fluid collection. There is focal hypodensity in the right occipital region which is new, consistent with a subacute infarction. Mild sulcal effacement but no mass effect on the ventricles. No midline shift. No other significant interval change. There is unchanged mild generalized atrophy. There is extensive white matter hypodensity which is unchanged, likely due to small vessel ischemic disease. There is a bony defect of the right posterior parietal calvarium which is chronic and unchanged. No acute bony abnormality is evident. IMPRESSION: Small subacute infarction of the right occipital lobe, nonhemorrhagic. No midline shift. This is superimposed on chronic changes of mild atrophy and white matter hypodensities which are likely due to small vessel disease. These results were called by telephone at the time of interpretation on 01/31/2016 at 3:54 am to Dr. Noel Christmas, who verbally acknowledged these results. Electronically Signed   By: Ellery Plunk M.D.   On: 01/31/2016 03:54   Dg Chest Port 1 View  01/31/2016  CLINICAL DATA:  Initial evaluation for endotracheal tube placement. EXAM: PORTABLE CHEST 1 VIEW COMPARISON:  Prior radiograph from earlier the same day. FINDINGS: The endotracheal tube has been advanced, with tip now positioned approximately 3.8 cm above the carina. Enteric tube in place with side hole the at or just beyond the GE junction. Consider advancement by I 3 cm. Cardiac mediastinal silhouettes are stable. Lungs are hypoinflated. Similar left upper lobe opacity. Right perihilar/basilar linear opacity  favored to reflect atelectasis. No pulmonary edema or pleural effusion. No pneumothorax. No acute osseous abnormality. IMPRESSION: 1. Tip of the endotracheal to Ing could position approximately 3.8 cm above the carina. 2. Interval placement of enteric to with side hole at for just beyond the GE junction. Consider advancement by approximately 3 cm to insure adequate placement within the stomach. 3. Similar patchy left upper lobe opacity, which may reflect atelectasis or infiltrate. 4. Mild right perihilar atelectasis. Electronically Signed   By: Rise Mu M.D.   On: 01/31/2016 05:56   Dg Chest Portable 1 View  01/31/2016  CLINICAL DATA:  Respiratory failure EXAM: PORTABLE CHEST 1 VIEW COMPARISON:  04/19/2014 FINDINGS: Endotracheal tube tip is above the level of the clavicles and should be advanced. I reported this by phone to Dr. Clarene Duke, and she had already seen this and advanced the tube. There is mild unchanged right hemidiaphragm elevation. There is mild patchy opacity in the left upper lobe medially which could represent atelectasis, aspiration, infectious infiltrate or hemorrhage. No pneumothorax. No large effusion. IMPRESSION: ET tube is high, as described above. This has already been advanced by the attending physician. Mild patchy opacity in the medial left upper lobe. Electronically Signed   By: Ellery Plunk M.D.   On: 01/31/2016 03:58   I have personally reviewed and evaluated these lab results as part of my medical decision-making.   EKG Interpretation   Date/Time:  Wednesday Jan 31 2016 03:22:30 EDT Ventricular Rate:  173 PR Interval:    QRS Duration: 106 QT Interval:  287 QTC Calculation: 487 R Axis:   -174 Text Interpretation:  Atrial fibrillation with rapid V-rate Right axis  deviation Consider left ventricular hypertrophy Repolarization  abnormality, prob rate related Baseline wander in lead(s) V3 A fib with  RVR new from previous Confirmed by Bronda Alfred MD, Rilyn Upshaw  (  [id number]) on 01/31/2016  5:31:07 AM      MDM   Final diagnoses:  Status epilepticus (HCC)  Metabolic acidosis with respiratory acidosis   Pt w/ hx including alcohol abuse, CHF, PVD, A fib p/w unresponsiveness noted by wife tonight, last known normal at 1am. On arrival by EMS, the patient was unresponsive and actively seizing. Immediately placed on monitoring including nonrebreather and gave the patient 2 mg IM Ativan. Because of the patient's altered mentation, prepared for intubation. Patient briefly stopped seizing but later began seizing again just before RSI. He was intubated with etomidate and rocuronium, see procedure note for details. Neurology, Dr. Roseanne Reno, was at bedside because stroke alert was called by EMS.  Gave 1g IV keppra, IVF bolus, and started on propofol for sedation. Taken to CT scanner which was negative acute, subacute infarct noted. Started on nicardipine drip for treatment of hypertension. BP improved however only modest improvement in HR from 180s to 160s despite fluids and sedation. Initial labs showed arterial pH of 7.04, CO2 55. Labs  show WBC 12.1, sodium 152, bicarbonate 13, creatinine 2.13, initial lactate greater than 17 likely due to status epilepticus. I discussed with critical care, Dr. Christene Slates, and per his recommendation added blood and urine cultures, repeat ABG, and unasyn for treatment of aspiration pneumonia. Pt admitted to ICU for further care.    I personally performed the services described in this documentation, which was scribed in my presence. The recorded information has been reviewed and is accurate.      Laurence Spates, MD 01/31/16 [phone]

## 2016-01-31 NOTE — H&P (Signed)
PULMONARY / CRITICAL CARE MEDICINE   Name: Kevin [name] MRN: [id number] DOB: 1951/02/14    ADMISSION DATE:  01/31/2016 CONSULTATION DATE:  01/31/16  REFERRING MD:  EDP  CHIEF COMPLAINT:  Seizures  HISTORY OF PRESENT ILLNESS:  Pt is encephelopathic; therefore, this HPI is obtained from chart review. Kevin [name] is a 65 y.o. male with PMH as outlined below. He was in his USOH up until early AM 01/31/16.  Wife states he went to bed and was last seen normal around 1am.  At 2am, he was vomiting and had eye deviation to the right.  EMS was summoned and pt was brought to [facility] ED for further evaluation.  He was initially brought in as code stroke.  On ED arrival, pt began to have grand mal seizure.  He received 2mg  ativan followed by 2nd dose of 2mg .  Neurology was at bedside and ordered an 1g Keppra load.  Pt was then intubated for airway protection.  Prior to intubation, he had AFRVR with HR as high as 170's.  After intubation and propofol, HR decreased to 150's.  CT of the head revealed equivocal subacute right occipital infarction but was otherwise unremarkable.  PCCM was called for admission.  PAST MEDICAL HISTORY :  He  has a past medical history of PAD (peripheral artery disease) (HCC); Hypertension; Cocaine abuse; ETOH abuse; Tobacco abuse; Cholelithiases (05/01/2012); Dysrhythmia; CHF (congestive heart failure) (HCC); Blood transfusion without reported diagnosis; Hepatitis C antibody test positive (04/2012); Claudication [facility]); Hyperpigmentation; Thrombocytopenia (HCC); and Persistent atrial fibrillation (HCC) (08/23/2014).  PAST SURGICAL HISTORY: He  has past surgical history that includes Endovascular stent insertion (right leg); TEE without cardioversion (05/05/2012); Cardioversion (05/05/2012); Cardiolite; Carotid angioplasty; doppler echocardiography; Cardiovascular stress test; left heart catheterization with coronary angiogram (N/A, 05/01/2012); and left heart catheterization with  coronary angiogram (N/A, 09/27/2014).  No Known Allergies  No current facility-administered medications on file prior to encounter.   Current Outpatient Prescriptions on File Prior to Encounter  Medication Sig  . apixaban (ELIQUIS) 5 MG TABS tablet Take 1 tablet (5 mg total) by mouth 2 (two) times daily.  Marland Kitchen diltiazem (CARDIZEM CD) 240 MG 24 hr capsule Take 1 capsule (240 mg total) by mouth daily.  . folic acid (FOLVITE) 1 MG tablet Take 1 tablet (1 mg total) by mouth daily. Need appointment before anymore refills  . isosorbide mononitrate (IMDUR) 30 MG 24 hr tablet Take 1 tablet (30 mg total) by mouth daily.  . rosuvastatin (CRESTOR) 20 MG tablet Take 1 tablet (20 mg total) by mouth daily.    FAMILY HISTORY:  His indicated that his mother is deceased. He indicated that his father is deceased. He indicated that all of his four sisters are alive. He indicated that both of his daughters are alive. He indicated that all of his three sons are alive.   SOCIAL HISTORY: He  reports that he has been smoking Cigarettes.  He has a 10 pack-year smoking history. He has never used smokeless tobacco. He reports that he drinks alcohol. He reports that he does not use illicit drugs.  REVIEW OF SYSTEMS:   Unable to obtain as pt is encephalopathic.  SUBJECTIVE:  On vent, unresponsive.  No further seizure activity after ativan / keppra / propofol.  VITAL SIGNS: BP 123/73 mmHg  Pulse 39  Resp 23  Ht 5\' 11"  (1.803 m)  Wt 198 lb (89.812 kg)  BMI 27.63 kg/m2  SpO2 91%  HEMODYNAMICS:    VENTILATOR SETTINGS: Vent Mode:  [-]  PRVC FiO2 (%):  [60 %-100 %] 60 % Set Rate:  [14 bmp-20 bmp] 20 bmp Vt Set:  [590 mL-600 mL] 600 mL PEEP:  [8 cmH20] 8 cmH20 Plateau Pressure:  [20 cmH20] 20 cmH20  INTAKE / OUTPUT:     PHYSICAL EXAMINATION: General: Adult male, in NAD. Neuro:  Sedated, comfortable, does not follow commands. HEENT: [place]/AT. PERRL, sclerae anicteric. Cardiovascular: RRR, no M/R/G.  Lungs:  Respirations even and unlabored.  Coarse, L > R. Abdomen: BS x 4, soft, NT/ND.  Musculoskeletal: No gross deformities, no edema.  Skin: Intact, warm, no rashes.  LABS:  BMET  Recent Labs Lab 01/31/16 0300  NA 152*  K 4.6  CL 107  CO2 13*  BUN 20  CREATININE 2.13*  GLUCOSE 186*    Electrolytes  Recent Labs Lab 01/31/16 0300  CALCIUM 10.8*    CBC  Recent Labs Lab 01/31/16 0300  WBC 12.1*  HGB 17.6*  HCT 54.9*  PLT 75*    Coag's  Recent Labs Lab 01/31/16 0300  APTT 26  INR 1.27    Sepsis Markers  Recent Labs Lab 01/31/16 0323  LATICACIDVEN >17.00*    ABG  Recent Labs Lab 01/31/16 0417  PHART 7.038*  PCO2ART 55.8*  PO2ART 150.0*    Liver Enzymes  Recent Labs Lab 01/31/16 0300  AST 41  ALT 30  ALKPHOS 124  BILITOT 1.3*  ALBUMIN 4.6    Cardiac Enzymes No results for input(s): TROPONINI, PROBNP in the last 168 hours.  Glucose  Recent Labs Lab 01/31/16 0302  GLUCAP 187*    Imaging Ct Head Wo Contrast  01/31/2016  CLINICAL DATA:  Altered mental status. EXAM: CT HEAD WITHOUT CONTRAST TECHNIQUE: Contiguous axial images were obtained from the base of the skull through the vertex without intravenous contrast. COMPARISON:  08/23/2015 FINDINGS: There is no intracranial hemorrhage or extra-axial fluid collection. There is focal hypodensity in the right occipital region which is new, consistent with a subacute infarction. Mild sulcal effacement but no mass effect on the ventricles. No midline shift. No other significant interval change. There is unchanged mild generalized atrophy. There is extensive white matter hypodensity which is unchanged, likely due to small vessel ischemic disease. There is a bony defect of the right posterior parietal calvarium which is chronic and unchanged. No acute bony abnormality is evident. IMPRESSION: Small subacute infarction of the right occipital lobe, nonhemorrhagic. No midline shift. This is superimposed on  chronic changes of mild atrophy and white matter hypodensities which are likely due to small vessel disease. These results were called by telephone at the time of interpretation on 01/31/2016 at 3:54 am to Dr. Noel Christmas, who verbally acknowledged these results. Electronically Signed   By: Ellery Plunk M.D.   On: 01/31/2016 03:54   Dg Chest Portable 1 View  01/31/2016  CLINICAL DATA:  Respiratory failure EXAM: PORTABLE CHEST 1 VIEW COMPARISON:  04/19/2014 FINDINGS: Endotracheal tube tip is above the level of the clavicles and should be advanced. I reported this by phone to Dr. Clarene Duke, and she had already seen this and advanced the tube. There is mild unchanged right hemidiaphragm elevation. There is mild patchy opacity in the left upper lobe medially which could represent atelectasis, aspiration, infectious infiltrate or hemorrhage. No pneumothorax. No large effusion. IMPRESSION: ET tube is high, as described above. This has already been advanced by the attending physician. Mild patchy opacity in the medial left upper lobe. Electronically Signed   By: Rosey Bath.D.  On: 01/31/2016 03:58     STUDIES:  CT head 05/10 > small subacute infarct in right occipital lobe.  No hemorrhage. CXR 05/10 > LUL opacity.  CULTURES: Blood 05/10 > Sputum 05/10 >  ANTIBIOTICS: Unasyn 05/10 >  SIGNIFICANT EVENTS: 05/10 > admitted with status epilepticus, required intubation.  LINES/TUBES: ETT 05/10 >  DISCUSSION: 65 y.o. M with hx ETOH and cocaine abuse, admitted 05/10 with status epilepticus and possible right occipital CVA.  He required intubation in ED and PCCM was called for admission.  ASSESSMENT / PLAN:  NEUROLOGIC A:   Acute metabolic encephalopathy. Status epilepticus. Subacute CVA. ETOH use. Polysubstance use - UDS in past positive for cocaine and THC. P:   Sedation:  Propofol gtt / Fentanyl PRN. RASS goal: 0 to -1. Daily WUA. Neuro following. Stroke workup per  neuro. UDS pending. Thiamine / Folate. Substance abuse counseling once extubated.  PULMONARY A: VDRF - due to inability to protect airway in the setting of status epilepticus. Concern for aspiration - per EDP, pt had frothy secretions during intubation. Tobacco dependence. P:   Full vent support. Wean as able. VAP prevention measures. SBT in AM if able. Abx / cultures per ID section. Pulmonary hygiene. CXR in AM. Tobacco cessation counseling once extubated.  CARDIOVASCULAR A:  Hypertensive emergency. A.fib with RVR - on eliquis. Hx HTN, CHF (EF 55-60% in Dec 2016). P:  Cardene gtt, goal SBP 150 (may need to d/c given rapid reduction in BP; dose has been cut in half since). Lopressor PRN (though caution given hx cocaine abuse). Hold anticoagulation for now given significant thrombocytopenia and CVA. Trend lactate, troponin. Continue outpatient rosuvastatin. Hold outpatient cardizem, imdur.  RENAL A:   Hypernatremia. AGMA - lactate. AoCKD. P:   Na acetate started given acidemia (despite being only AG acidosis). BMP in AM.  GASTROINTESTINAL A:   Hx HCV. GI prophylaxis. Nutrition. P:   SUP: Pantoprazole. NPO.  HEMATOLOGIC A:   Thrombocytopenia - presumed due to bone marrow suppression due to ETOH. On chronic anticoagulation due to AFRVR. VTE Prophylaxis. P:  Monitor platelet counts. Hold anticoagulation for now given significant thrombocytopenia and CVA. SCD's. CBC in AM.  INFECTIOUS A:   Concern for aspiration - per EDP, pt had frothy secretions during intubation. P:   Abx as above (unasyn).  Follow cultures as above. PCT algorithm to limit abx exposure.  ENDOCRINE A:   Hyperglycemia - no hx DM. P:   SSI. Assess Hgb A1c.   Family updated: Wife at bedside.  Interdisciplinary Family Meeting v Palliative Care Meeting:  Due by: 02/06/16.  CC time: 35 minutes.   Rutherford Guys, [place] [facility] [place] [facility] Pager: [phone]  or [phone] 01/31/2016, 5:19 AM

## 2016-01-31 NOTE — Progress Notes (Addendum)
Code Stroke called on 65 y.o male history of hypertension, atrial fibrillation on Eliquis, peripheral artery disease, alcohol and cocaine abuse. LSN 0100 , per EMS Pt woke up wife around 0220 vomiting in the bed. Unresponsive in field with right side gaze. Witnessed active seizing upon arrival to [facility]. NIHSS 36, unresponsive to external stimuli, including noxious stimuli with right sided gaze. CBG 187.  Pt intubated upon arrival, once airway stabilized taken to STAT CT scan. Per Neurologist Dr. Roseanne Reno CT showed an equivocal subacute right occipital infarction, but was otherwise unremarkable, including no signs of an acute intracranial hemorrhage. Ativan 4mg  and Keppra 1 G given IV for seizures. Pt started on Propofol and Cardene gtt. Pt for admit per CCM management, Neurology consulting seizure avtivity. No TPA treatment due to witnessed seizure and Eliquis use.

## 2016-01-31 NOTE — Progress Notes (Signed)
MRI called again, no answer

## 2016-01-31 NOTE — Progress Notes (Signed)
RT NOTE:  ISTAT ABG results reported to Dr. Delton Coombes @ bedside. Current vent settings ok @ this time. RT will monitor.

## 2016-01-31 NOTE — Progress Notes (Signed)
Pharmacy Antibiotic Note  Kevin [name] is a 65 y.o. male admitted on 01/31/2016 with Seizures.  Pharmacy has been consulted for Unasyn dosing for aspiration pneumonia.   Plan: -Unasyn 3g IV q8h -Trend WBC, temp, renal function  -F/U infectious work-up  Height: 5\' 10"  (177.8 cm) Weight: 198 lb (89.812 kg) IBW/kg (Calculated) : 73  No data recorded.   Recent Labs Lab 01/31/16 0300 01/31/16 0323  WBC 12.1*  --   CREATININE 2.13*  --   LATICACIDVEN  --  >17.00*    Estimated Creatinine Clearance: 39.5 mL/min (by C-G formula based on Cr of 2.13).    No Known Allergies   Abran Duke 01/31/2016 4:56 AM

## 2016-01-31 NOTE — Progress Notes (Signed)
Bedside EEG completed, results pending. 

## 2016-01-31 NOTE — Progress Notes (Signed)
CRITICAL VALUE ALERT  Critical value received:  Troponin 17.54   Date of notification:  01/31/16  Time of notification:  1615  Critical value read back:Yes.    Nurse who received alert:  Sullivan Lone  MD notified (1st page):  Sood  Time of first page:  1616  MD notified (2nd page):  Time of second page:  Responding MD:    Time MD responded:

## 2016-01-31 NOTE — Progress Notes (Signed)
RT NOTE:  Critical ABG ISTAT results reported to Dr. Clarene Duke. Vent changes made.

## 2016-01-31 NOTE — ED Notes (Signed)
Pt brought to ED by EMS for a code stroke, last know well today at 0100 am around 0220 pt started vomiting and wife noticed some weakness and pt started getting flaccid on the left side. Pt no verbal, with left gaze and left side flaccid legs and arm on EMS arrival, pt no verbal having some movement on the right side. Hypertensive on EMS arrival 180/110 and 215/156 on ED arrival, pt started seizing on arrival to ED, CBG 147.

## 2016-01-31 NOTE — Progress Notes (Addendum)
Pt had 30 beat run of V tach, pt never lost pulse.  Dr. Molli Knock with CCM notified, 12.5 PO Lopressor BID and STAT Mag ordered, pending.  Pt MRI scheduled for current time.  Dr. Amada Jupiter with neuro also notified of rhythm change, concern for safety of traveling mentioned by RN.  MRI encouraged, as pt is critically ill and MRI necessary for planning care going forward.  Pt currently in Afib with frequent PVCs, rate in 120s  MRI called x2, several ED admissions have postponed original time.

## 2016-01-31 NOTE — ED Notes (Addendum)
IM ativan 2mg  given left deltoid per verbal order MD little   Pt actively seizing. Nasal trumpet inserted via MD Little.

## 2016-01-31 NOTE — Procedures (Signed)
History: 65 yo M presenting with seizures  Sedation: Propofol  Technique: This is a 21 channel routine scalp EEG performed at the bedside with bipolar and monopolar montages arranged in accordance to the international 10/20 system of electrode placement. One channel was dedicated to EKG recording.    Background: The background consists of low voltage irregular generalized delta activity interspersed with centrally predominant runs of alpha/beta resembling sleep structures. This patter was present throughout the EEG  Photic stimulation: Physiologic driving is not performed  EEG Abnormalities: 1) lack of waking state  Clinical Interpretation: This EEG is most consistent with a sedated state. No seizure or seizure predisposition was recorded.  Please note that a normal EEG does not preclude the possibility of epilepsy.   Ritta Slot, MD [facility] [phone]  If 7pm- 7am, please page neurology on call as listed in AMION.

## 2016-01-31 NOTE — Consult Note (Signed)
CARDIOLOGY CONSULT NOTE   Patient ID: Kevin [name] MRN: [id number] DOB/AGE: 10-01-1950 65 y.o.  Admit date: 01/31/2016  Primary Physician   Benito Mccreedy, MD Primary Cardiologist: Dr. Debara Pickett Requesting MD: Dr. Halford Chessman Reason for Consultation: Elevated troponin  HPI: [name] is a 65 year old male with a past medical history of PAD, HTN, cocaine abuse (negative this admission), persistent Afib (on Eliquis), TIA and Hepatitis C.    He presented to the ED via EMS on 01/31/16 early in the morning after he woke up vomiting and had an episode of incontinence. [name] noted that he was non verbal, she called EMS.  When he arrived to the ED he began seizing. Code stroke was called. CT of head showed equivocal subacute right occipital infarction. No hemorrhage.   Patient had elevated troponin of 0.75 initially, it has increased to 17.54. Cardiology was consulted. EKG when patient was admitted showed Afib RVR with rate of 170.  Repeat EKG shows Afib with rate of 106 with T wave inversion in inferolateral leads, this is also noted in an EKG from 07/2015.    Patient is intubated and sedated in the ICU, unable to assess if he had any chest pain. His last Echo was in December 2016, EF was 55-60%, no wall motion abnormalities. He had an intermediate risk Lexiscan Myoview in December 2015, that showed moderate area of anterolateral/lateral wall ischemia. He went for a left heart cath a month later in January 2016 (report below), and he had 90% tubular mid-ramus stenosis and 70% bifurcation PDA/PLB stenosis. Medical management was reccommended as patient had a history of medication non compliance and a low platelet count.     Past Medical History  Diagnosis Date  . PAD (peripheral artery disease) ([place])   . Hypertension   . Cocaine abuse   . ETOH abuse   . Tobacco abuse   . Cholelithiases 05/01/2012  . Dysrhythmia     afib  . CHF (congestive heart failure) ([place])   . Blood  transfusion without reported diagnosis   . Hepatitis C antibody test positive 04/2012  . Claudication ([place])     bilateral  . Hyperpigmentation   . Thrombocytopenia ([place])   . Persistent atrial fibrillation ([place]) 08/23/2014     Past Surgical History  Procedure Laterality Date  . Endovascular stent insertion  right leg  . Tee without cardioversion  05/05/2012    Procedure: TRANSESOPHAGEAL ECHOCARDIOGRAM (TEE);  Surgeon: Sanda Klein, MD;  Location: [facility];  Service: Cardiovascular;  Laterality: N/A;  . Cardioversion  05/05/2012    Procedure: CARDIOVERSION;  Surgeon: Sanda Klein, MD;  Location: [facility] ENDOSCOPY;  Service: Cardiovascular;  Laterality: N/A;  . Cardiolite      negative for ischemia  . Carotid artery angioplasty    . Doppler echocardiography    . Cardiovascular stress test    . Left heart catheterization with coronary angiogram N/A 05/01/2012    Procedure: LEFT HEART CATHETERIZATION WITH CORONARY ANGIOGRAM;  Surgeon: Leonie Man, MD;  Location: [facility] CATH LAB;  Service: Cardiovascular;  Laterality: N/A;  . Left heart catheterization with coronary angiogram N/A 09/27/2014    Procedure: LEFT HEART CATHETERIZATION WITH CORONARY ANGIOGRAM;  Surgeon: Pixie Casino, MD;  Location: [facility] CATH LAB;  Service: Cardiovascular;  Laterality: N/A;    No Known Allergies  I have reviewed the patient's current medications . sodium chloride   Intravenous Once  . ampicillin-sulbactam (UNASYN) IV  3 g Intravenous Q8H  . antiseptic oral  rinse  7 mL Mouth Rinse 10 times per day  . aspirin  81 mg Per Tube Daily  . chlorhexidine gluconate (SAGE KIT)  15 mL Mouth Rinse BID  . folic acid  1 mg Intravenous Daily  . free water  250 mL Per Tube Q6H  . insulin aspart  0-15 Units Subcutaneous Q4H  . levETIRAcetam  1,000 mg Intravenous Q12H  . metoprolol tartrate  12.5 mg Oral BID  . pantoprazole (PROTONIX) IV  40 mg Intravenous QHS  . rosuvastatin  20 mg Per Tube q1800  . thiamine  100 mg  Intravenous Daily   . sodium chloride 100 mL/hr at 01/31/16 1500  . feeding supplement (VITAL AF 1.2 CAL) 1,000 mL (01/31/16 1641)  . heparin    . midazolam (VERSED) infusion 4 mg/hr (01/31/16 1500)  . niCARDipine Stopped (01/31/16 1644)   sodium chloride, acetaminophen, fentaNYL (SUBLIMAZE) injection, fentaNYL (SUBLIMAZE) injection, metoprolol  Prior to Admission medications   Medication Sig Start Date End Date Taking? Authorizing Provider  apixaban (ELIQUIS) 5 MG TABS tablet Take 1 tablet (5 mg total) by mouth 2 (two) times daily. 08/27/15   Shanker Kristeen Mans, MD  diltiazem (CARDIZEM CD) 240 MG 24 hr capsule Take 1 capsule (240 mg total) by mouth daily. 08/27/15   Shanker Kristeen Mans, MD  folic acid (FOLVITE) 1 MG tablet Take 1 tablet (1 mg total) by mouth daily. Need appointment before anymore refills 02/27/15   Pixie Casino, MD  isosorbide mononitrate (IMDUR) 30 MG 24 hr tablet Take 1 tablet (30 mg total) by mouth daily. 08/25/15   Shanker Kristeen Mans, MD  rosuvastatin (CRESTOR) 20 MG tablet Take 1 tablet (20 mg total) by mouth daily. 08/25/15   Shanker Kristeen Mans, MD     Social History   Social History  . Marital Status: Married    Spouse Name: Cleone Slim  . Number of Children: 5  . Years of Education: 12   Occupational History  . Retired Hydrologist   Social History Main Topics  . Smoking status: Current Every Day Smoker -- 0.25 packs/day for 40 years    Types: Cigarettes  . Smokeless tobacco: Never Used  . Alcohol Use: 0.0 - 9.6 oz/week    0-16 Standard drinks or equivalent per week     Comment: He used to drink up to 1/2 pint daily; 6 beers. Quit in March 2015  . Drug Use: No     Comment: cocaine, marijuana  . Sexual Activity:    Partners: Female   Other Topics Concern  . Not on file   Social History Narrative   Lives with his wife.  Was raised by a non-related family from the age of 38 years, and so knows very little about his biological family history.    Family Status    Relation Status Death Age  . Daughter Alive   . Mother Deceased 55    Cancer  . Father Deceased     No known CAD  . Sister Alive   . Son Alive   . Sister Alive   . Sister Alive   . Sister Alive   . Daughter Alive   . Son Alive   . Son Alive    Family History  Problem Relation Age of Onset  . Seizures Daughter   . Cancer Mother      ROS:  Full 14 point review of systems complete and found to be negative unless listed above.  Physical Exam: Blood pressure 84/68, pulse  48, temperature 99.9 F (37.7 C), resp. rate 20, height '5\' 11"'  (1.803 m), weight 198 lb (89.812 kg), SpO2 95 %.  General: Well developed, well nourished, male in no acute distress Head: Eyes PERRLA, No xanthomas.   Normocephalic and atraumatic, oropharynx without edema or exudate. Dentition:  Lungs:  Heart: HRRR S1 S2, no rub/gallop, Heart irregular rate and rhythm with S1, S2  murmur. pulses are 2+ extrem.   Neck: No carotid bruits. No lymphadenopathy.  JVD. Abdomen: Bowel sounds present, abdomen soft and non-tender without masses or hernias noted. Msk:  No spine or cva tenderness. No weakness, no joint deformities or effusions. Extremities: No clubbing or cyanosis.  edema.  Neuro: Alert and oriented X 3. No focal deficits noted. Psych:  Good affect, responds appropriately Skin: No rashes or lesions noted.  Labs:   Lab Results  Component Value Date   WBC 12.1* 01/31/2016   HGB 20.1* 01/31/2016   HCT 59.0* 01/31/2016   MCV 103.0* 01/31/2016   PLT 75* 01/31/2016    Recent Labs  01/31/16 0300  INR 1.27    Recent Labs Lab 01/31/16 0300 01/31/16 0323  NA 152* 149*  K 4.6 4.4  CL 107 113*  CO2 13*  --   BUN 20 27*  CREATININE 2.13* 1.80*  CALCIUM 10.8*  --   PROT 8.8*  --   BILITOT 1.3*  --   ALKPHOS 124  --   ALT 30  --   AST 41  --   GLUCOSE 186* 165*  ALBUMIN 4.6  --    MAGNESIUM  Date Value Ref Range Status  01/31/2016 2.4 1.7 - 2.4 mg/dL Final    Recent Labs  01/31/16 0741  01/31/16 1416  TROPONINI 0.75* 17.54*    Recent Labs  01/31/16 0333  TROPIPOC 0.03   No results found for: BNP Lab Results  Component Value Date   CHOL 182 08/24/2015   HDL 57 08/24/2015   LDLCALC 112* 08/24/2015   TRIG 1372* 01/31/2016   No results found for: DDIMER No results found for: LIPASE, AMYLASE TSH  Date/Time Value Ref Range Status  04/19/2014 09:04 AM 4.220 0.350 - 4.500 uIU/mL Final   VITAMIN B-12  Date/Time Value Ref Range Status  05/05/2012 05:11 AM 475 211 - 911 pg/mL Final    Echo: 08/25/15 - Left ventricle: The cavity size was normal. There was mild  concentric hypertrophy. Systolic function was normal. The  estimated ejection fraction was in the range of 55% to 60%. Wall  motion was normal; there were no regional wall motion  abnormalities. - Left atrium: The atrium was moderately dilated.  ECG: Afib, diffuse T wave inversion  CARDIAC CATHETERIZATION REPORT 09/27/14 Coronary Angiographic Data:  Left Main: Long vessel, angiographically normal, trifurcates into LAD, LCX and Ramus branches  Left Anterior Descending (LAD): There is a proximal 20-30% eccentric, hazy plaque and mild mid-LAD stenosis, vessel reaches the apex  1st diagonal (D1): Smaller branch, mild diffuse disease  Circumflex (LCx): AV groove vessel, gives off 2 OM branches, mild ostial stenosis.  1st obtuse marginal: Smaller vessel, no stenosis.  2nd obtuse marginal: Minimal luminal irregularities  Ramus Intermedius: There is a tubular 90% mid-Ramus stenosis, the vessel coarses to the lateral wall  Right Coronary Artery: Dominant artery. Mild luminal irregularities.  right ventricle branch of right coronary artery: No stenosis.  posterior descending artery: There is a 70% ostial bifurcation stenosis, however, this is <2.0 mm in diameter  posterior lateral branch: Ostial stenosis (see above)  Impression: 1. 90% tubular mid-Ramus stenosis - likely anginal culprit,  given ischemia in the lateral wall on NST 2. 70% bifurcation PDA/PLB stenosis - small vessel, <2.0 mm 3. LVEDP = 20 mmHg   Radiology:  Ct Head Wo Contrast  01/31/2016  CLINICAL DATA:  Altered mental status. EXAM: CT HEAD WITHOUT CONTRAST TECHNIQUE: Contiguous axial images were obtained from the base of the skull through the vertex without intravenous contrast. COMPARISON:  08/23/2015 FINDINGS: There is no intracranial hemorrhage or extra-axial fluid collection. There is focal hypodensity in the right occipital region which is new, consistent with a subacute infarction. Mild sulcal effacement but no mass effect on the ventricles. No midline shift. No other significant interval change. There is unchanged mild generalized atrophy. There is extensive white matter hypodensity which is unchanged, likely due to small vessel ischemic disease. There is a bony defect of the right posterior parietal calvarium which is chronic and unchanged. No acute bony abnormality is evident. IMPRESSION: Small subacute infarction of the right occipital lobe, nonhemorrhagic. No midline shift. This is superimposed on chronic changes of mild atrophy and white matter hypodensities which are likely due to small vessel disease. These results were called by telephone at the time of interpretation on 01/31/2016 at 3:54 am to Dr. Wallie Char, who verbally acknowledged these results. Electronically Signed   By: Andreas Newport M.D.   On: 01/31/2016 03:54   Dg Chest Port 1 View  01/31/2016  CLINICAL DATA:  Initial evaluation for endotracheal tube placement. EXAM: PORTABLE CHEST 1 VIEW COMPARISON:  Prior radiograph from earlier the same day. FINDINGS: The endotracheal tube has been advanced, with tip now positioned approximately 3.8 cm above the carina. Enteric tube in place with side hole the at or just beyond the GE junction. Consider advancement by I 3 cm. Cardiac mediastinal silhouettes are stable. Lungs are hypoinflated. Similar  left upper lobe opacity. Right perihilar/basilar linear opacity favored to reflect atelectasis. No pulmonary edema or pleural effusion. No pneumothorax. No acute osseous abnormality. IMPRESSION: 1. Tip of the endotracheal to Ing could position approximately 3.8 cm above the carina. 2. Interval placement of enteric to with side hole at for just beyond the GE junction. Consider advancement by approximately 3 cm to insure adequate placement within the stomach. 3. Similar patchy left upper lobe opacity, which may reflect atelectasis or infiltrate. 4. Mild right perihilar atelectasis. Electronically Signed   By: Jeannine Boga M.D.   On: 01/31/2016 05:56   Dg Chest Portable 1 View  01/31/2016  CLINICAL DATA:  Respiratory failure EXAM: PORTABLE CHEST 1 VIEW COMPARISON:  04/19/2014 FINDINGS: Endotracheal tube tip is above the level of the clavicles and should be advanced. I reported this by phone to Dr. Rex Kras, and she had already seen this and advanced the tube. There is mild unchanged right hemidiaphragm elevation. There is mild patchy opacity in the left upper lobe medially which could represent atelectasis, aspiration, infectious infiltrate or hemorrhage. No pneumothorax. No large effusion. IMPRESSION: ET tube is high, as described above. This has already been advanced by the attending physician. Mild patchy opacity in the medial left upper lobe. Electronically Signed   By: Andreas Newport M.D.   On: 01/31/2016 03:58    ASSESSMENT AND PLAN:    Active Problems:   Status epilepticus ([place])   Seizures ([place])  1. Elevated troponin: Patient presented with acute CVA and seizures.  He was intubated for airway protection. His troponin was initially 0.75, then increased to 17.54.  He has a  known history of CAD, cath report above. It is likely that he is having a NSTEMI, however patient is not a current candidate for cardiac catheterization.  According to nursing, he is unresponsive even when sedation is on  hold. He will need cardiac cath pending neurological recovery. Currently he is not a candidate for cath. He is on heparin gtt and we will continue this. Will follow troponin.   2. Non sustained VT: Review of telemetry shows episodes of NSVT. Runs are 18-20 beats. BP remains elevated.  He is getting 12.65m Metoprolol BID, we can increase this to 266mBID.   3. Persistent Atrial Fibrillation: Patient has history of this, continues to be in Afib.  He is on heparin gtt for ACS. His rates are elevated in 90-100 range. Can increase metoprolol for better control.    This patients CHA2DS2-VASc Score and unadjusted Ischemic Stroke Rate (% per year) is equal to 4.8 % stroke rate/year from a score of 4 Above score calculated as 1 point each if present [CHF, HTN, DM, Vascular=MI/PAD/Aortic Plaque, Age if 65-74, or Male], 2 points each if present [Age > 75, or Stroke/TIA/TE]  Reviewed outpatient clinic notes and patient has a history of non compliance. Unsure if he was taking his Eliquis that is listed on his home med list.    Signed: ErArbutus LeasNP 01/31/2016 5:18 PM Pager [phone]-Sign MD   Attending Note:   The patient was seen and examined.  Agree with assessment and plan as noted above.  Changes made to the above note as needed.  Pt ws examined in the neuro ICU after his MRI He is still unresponsive / minimally unresponsive - after very low dose of versed Has not been responsive since admission.  1. Elevated Troponin.   His Troponin of 17 is c/w an MI.   He has known CAD by caths in the past ( 3 caths noted in our system) was treated medically.  I have reviewed the angiograms from the last cath.   He had moderate diffuse disease and a tight stenosis in the proximal PDA.   At this point , he is not a candidate for cath .  Will need to see if he makes signficant improvement from a neurological standpoint.   2. Nonsustained VT:   Will increase his metoprolol to 25 bid.   This will also  help with rate control with his atrial fib   3. Persistent atrial fib:   CHADS2VASC score of 4. He will need anticoagulation .   Currently getting heparin .   He was supposed to be on Eliquis.   Its not known if he was actually taking this   He has lots of issues with noncompliance in the past.   This would make effective anticoagulation difficult   plans per neuro at this point.   PhThayer HeadingsJrBrooke Bonito MD, FASnowden River Surgery Center LLC/06/2016, 6:52 PM [address] [phone]

## 2016-01-31 NOTE — Progress Notes (Signed)
Initial Nutrition Assessment  DOCUMENTATION CODES:   Not applicable  INTERVENTION:  -Initiate tube feeding via OGT with Vital AF 1.2 @ 75 ml/hr (1800 ml/d). -250 ml free water every 6 hours -Provides 2160 kcals and 135 grams of protein, 2458 ml free water (meets 100% needs).    NUTRITION DIAGNOSIS:   Inadequate oral intake related to inability to eat as evidenced by NPO status.  GOAL:   Provide needs based on ASPEN/SCCM guidelines  MONITOR:   Vent status, Labs, Weight trends, Skin, I & O's, TF tolerance  REASON FOR ASSESSMENT:   Consult Enteral/tube feeding initiation and management  ASSESSMENT:   Pt with hx ETOH and cocaine abuse, admitted 05/10 with status epilepticus and possible right occipital CVA. He required intubation in ED and PCCM was called for admission.  5/9 High TG--> propofol discontinued  Pt remains on vent.  Pt weight is stable at 198 lbs since 12/16.  Temp (24hrs), Avg:99.1 F (37.3 C), Min:97 F (36.1 C), Max:100.6 F (38.1 C) Ve: 11.5 ml  NFPE: mild muscle depletion, no fat depletion, no edema.  Labs reviewed; TG 1372, Na 149, Cl 113, BUN 27, creat 1.8, glucose 165.  Meds reviewed; folic acid, thiamine.   Diet Order:  Diet NPO time specified  Skin:  Reviewed, no issues  Last BM:  unknown  Height:   Ht Readings from Last 1 Encounters:  01/31/16 5\' 11"  (1.803 m)    Weight:   Wt Readings from Last 1 Encounters:  01/31/16 198 lb (89.812 kg)    Ideal Body Weight:  78.2 kg  BMI:  Body mass index is 27.63 kg/(m^2).  Estimated Nutritional Needs:   Kcal:  2091  Protein:  135  Fluid:  Per MD  EDUCATION NEEDS:   No education needs identified at this time  Beryle Quant, MS N[facility] Dietetic Intern Pager [phone]

## 2016-01-31 NOTE — Progress Notes (Signed)
Pt transported to MRI and back safely on vent.

## 2016-01-31 NOTE — Progress Notes (Signed)
Patient transferred from ED to 60M without any complications. RT will continue to monitor.

## 2016-01-31 NOTE — Progress Notes (Signed)
Lab called x3 regarding 1251 STAT Mag and 1400 troponin.  Results still pending

## 2016-01-31 NOTE — Progress Notes (Signed)
PULMONARY / CRITICAL CARE MEDICINE   Name: Kevin [name] MRN: [id number] DOB: 02-15-1951    ADMISSION DATE:  01/31/2016 CONSULTATION DATE:  01/31/16  REFERRING MD:  EDP  CHIEF COMPLAINT:  Seizures  HISTORY OF PRESENT ILLNESS:  Pt is encephelopathic; therefore, this HPI is obtained from chart review. Kevin [name] is a 66 y.o. male with PMH as outlined below. He was in his USOH up until early AM 01/31/16.  Wife states he went to bed and was last seen normal around 1am.  At 2am, he was vomiting and had eye deviation to the right.  EMS was summoned and pt was brought to [facility] ED for further evaluation.  He was initially brought in as code stroke.  On ED arrival, pt began to have grand mal seizure.  He received 2mg  ativan followed by 2nd dose of 2mg .  Neurology was at bedside and ordered an 1g Keppra load.  Pt was then intubated for airway protection.  Prior to intubation, he had AFRVR with HR as high as 170's.  After intubation and propofol, HR decreased to 150's.  CT of the head revealed equivocal subacute right occipital infarction but was otherwise unremarkable.  PCCM was called for admission.  SUBJECTIVE:  No events, sedate.  VITAL SIGNS: BP 98/72 mmHg  Pulse 55  Temp(Src) 98.4 F (36.9 C)  Resp 20  Ht 5\' 11"  (1.803 m)  Wt 89.812 kg (198 lb)  BMI 27.63 kg/m2  SpO2 97%  HEMODYNAMICS:    VENTILATOR SETTINGS: Vent Mode:  [-] PRVC FiO2 (%):  [60 %-100 %] 70 % Set Rate:  [14 bmp-20 bmp] 20 bmp Vt Set:  [590 mL-600 mL] 600 mL PEEP:  [8 cmH20] 8 cmH20 Plateau Pressure:  [20 cmH20] 20 cmH20  INTAKE / OUTPUT:    PHYSICAL EXAMINATION: General: Adult male, in NAD. Neuro:  Sedated, comfortable, does not follow commands. HEENT: [place]/AT. PERRL, sclerae anicteric. Cardiovascular: RRR, no M/R/G.  Lungs: Respirations even and unlabored.  Coarse, L > R. Abdomen: BS x 4, soft, NT/ND.  Musculoskeletal: No gross deformities, no edema.  Skin: Intact, warm, no  rashes.  LABS:  BMET  Recent Labs Lab 01/31/16 0300  NA 152*  K 4.6  CL 107  CO2 13*  BUN 20  CREATININE 2.13*  GLUCOSE 186*   Electrolytes  Recent Labs Lab 01/31/16 0300  CALCIUM 10.8*   CBC  Recent Labs Lab 01/31/16 0300  WBC 12.1*  HGB 17.6*  HCT 54.9*  PLT 75*   Coag's  Recent Labs Lab 01/31/16 0300  APTT 26  INR 1.27   Sepsis Markers  Recent Labs Lab 01/31/16 0323 01/31/16 0549  LATICACIDVEN >17.00* 7.1*   ABG  Recent Labs Lab 01/31/16 0417 01/31/16 0542  PHART 7.038* 7.303*  PCO2ART 55.8* 37.2  PO2ART 150.0* 69.0*   Liver Enzymes  Recent Labs Lab 01/31/16 0300  AST 41  ALT 30  ALKPHOS 124  BILITOT 1.3*  ALBUMIN 4.6   Cardiac Enzymes No results for input(s): TROPONINI, PROBNP in the last 168 hours.   Glucose  Recent Labs Lab 01/31/16 0302  GLUCAP 187*   Imaging Ct Head Wo Contrast  01/31/2016  CLINICAL DATA:  Altered mental status. EXAM: CT HEAD WITHOUT CONTRAST TECHNIQUE: Contiguous axial images were obtained from the base of the skull through the vertex without intravenous contrast. COMPARISON:  08/23/2015 FINDINGS: There is no intracranial hemorrhage or extra-axial fluid collection. There is focal hypodensity in the right occipital region which is new, consistent with  a subacute infarction. Mild sulcal effacement but no mass effect on the ventricles. No midline shift. No other significant interval change. There is unchanged mild generalized atrophy. There is extensive white matter hypodensity which is unchanged, likely due to small vessel ischemic disease. There is a bony defect of the right posterior parietal calvarium which is chronic and unchanged. No acute bony abnormality is evident. IMPRESSION: Small subacute infarction of the right occipital lobe, nonhemorrhagic. No midline shift. This is superimposed on chronic changes of mild atrophy and white matter hypodensities which are likely due to small vessel disease. These  results were called by telephone at the time of interpretation on 01/31/2016 at 3:54 am to Dr. Noel Christmas, who verbally acknowledged these results. Electronically Signed   By: Ellery Plunk M.D.   On: 01/31/2016 03:54   Dg Chest Port 1 View  01/31/2016  CLINICAL DATA:  Initial evaluation for endotracheal tube placement. EXAM: PORTABLE CHEST 1 VIEW COMPARISON:  Prior radiograph from earlier the same day. FINDINGS: The endotracheal tube has been advanced, with tip now positioned approximately 3.8 cm above the carina. Enteric tube in place with side hole the at or just beyond the GE junction. Consider advancement by I 3 cm. Cardiac mediastinal silhouettes are stable. Lungs are hypoinflated. Similar left upper lobe opacity. Right perihilar/basilar linear opacity favored to reflect atelectasis. No pulmonary edema or pleural effusion. No pneumothorax. No acute osseous abnormality. IMPRESSION: 1. Tip of the endotracheal to Ing could position approximately 3.8 cm above the carina. 2. Interval placement of enteric to with side hole at for just beyond the GE junction. Consider advancement by approximately 3 cm to insure adequate placement within the stomach. 3. Similar patchy left upper lobe opacity, which may reflect atelectasis or infiltrate. 4. Mild right perihilar atelectasis. Electronically Signed   By: Rise Mu M.D.   On: 01/31/2016 05:56   Dg Chest Portable 1 View  01/31/2016  CLINICAL DATA:  Respiratory failure EXAM: PORTABLE CHEST 1 VIEW COMPARISON:  04/19/2014 FINDINGS: Endotracheal tube tip is above the level of the clavicles and should be advanced. I reported this by phone to Dr. Clarene Duke, and she had already seen this and advanced the tube. There is mild unchanged right hemidiaphragm elevation. There is mild patchy opacity in the left upper lobe medially which could represent atelectasis, aspiration, infectious infiltrate or hemorrhage. No pneumothorax. No large effusion. IMPRESSION: ET  tube is high, as described above. This has already been advanced by the attending physician. Mild patchy opacity in the medial left upper lobe. Electronically Signed   By: Ellery Plunk M.D.   On: 01/31/2016 03:58   STUDIES:  CT head 05/10 > small subacute infarct in right occipital lobe.  No hemorrhage. CXR 05/10 > LUL opacity.  CULTURES: Blood 05/10 > Sputum 05/10 >  ANTIBIOTICS: Unasyn 05/10 >  SIGNIFICANT EVENTS: 05/10 > admitted with status epilepticus, required intubation.  LINES/TUBES: ETT 05/10 >  DISCUSSION: 65 y.o. M with hx ETOH and cocaine abuse, admitted 05/10 with status epilepticus and possible right occipital CVA.  He required intubation in ED and PCCM was called for admission.  ASSESSMENT / PLAN:  NEUROLOGIC A:   Acute metabolic encephalopathy. Status epilepticus. Subacute CVA. ETOH use. Polysubstance use - UDS in past positive for cocaine and THC.  P:   D/C propofol (high trigs). Fentanyl drip. Versed drip. Daily WUA. Neuro following. Stroke workup per neuro. UDS noted. Thiamine / Folate. Substance abuse counseling once extubated.  PULMONARY A: VDRF - due to  inability to protect airway in the setting of status epilepticus. Concern for aspiration - per EDP, pt had frothy secretions during intubation. Tobacco dependence. Unusually high need for O2 with a clear CXR but chronically on eliquis. P:   Full vent support. Wean as able. Repeat ABG now. VAP prevention measures. Abx / cultures per ID section. Pulmonary hygiene. CXR in AM. Tobacco cessation counseling once extubated.  CARDIOVASCULAR A:  Hypertensive emergency. A.fib with RVR - on eliquis. Hx HTN, CHF (EF 55-60% in Dec 2016). P:  Cardene gtt, goal SBP 150 (may need to d/c given rapid reduction in BP; dose has been cut in half since). Hold anticoagulation for now given significant thrombocytopenia and CVA. Trend lactate, troponin. Continue outpatient rosuvastatin. Hold  outpatient cardizem, imdur.  RENAL A:   Hypernatremia. AGMA - lactate. AoCKD. P:   Replace electrolytes as indicated. BMP in AM. Free water.  GASTROINTESTINAL A:   Hx HCV. GI prophylaxis. Nutrition. P:   SUP: Pantoprazole. Consult nutrition for TF as per nutrition.  HEMATOLOGIC A:   Thrombocytopenia - presumed due to bone marrow suppression due to ETOH. On chronic anticoagulation due to AFRVR. VTE Prophylaxis. P:  Monitor platelet counts. Hold anticoagulation for now given significant thrombocytopenia and CVA. SCD's. CBC in AM.  INFECTIOUS A:   Concern for aspiration - per EDP, pt had frothy secretions during intubation. P:   Abx as above (unasyn).  Follow cultures as above. PCT noted.  ENDOCRINE A:   Hyperglycemia - no hx DM. P:   SSI. Assess Hgb A1c.  Family updated: Wife updated bedside.  Interdisciplinary Family Meeting v Palliative Care Meeting:  Due by: 02/06/16.  The patient is critically ill with multiple organ systems failure and requires high complexity decision making for assessment and support, frequent evaluation and titration of therapies, application of advanced monitoring technologies and extensive interpretation of multiple databases.   Critical Care Time devoted to patient care services described in this note is  50  Minutes. This time reflects time of care of this signee Dr Koren Bound. This critical care time does not reflect procedure time, or teaching time or supervisory time of PA/NP/Med student/Med Resident etc but could involve care discussion time.  Alyson Reedy, M.D. [facility] Pulmonary/Critical Care Medicine. Pager: [phone]. After hours pager: [phone].

## 2016-01-31 NOTE — Progress Notes (Signed)
Pt exhibiting seizure activity upon return from MRI.  Rhythmic twitching of bilateral UEs as well as LLE.  No other neuro changes noted, pupils still unequal and sluggish, with R sided deviation of R eye.  Neuro notified, 1 mg Ativan STAT and Keppra 1500 mg Q 12 h ordered.  Pm RN aware.

## 2016-01-31 NOTE — ED Notes (Signed)
Patient CBG is 187

## 2016-01-31 NOTE — Progress Notes (Signed)
ANTICOAGULATION CONSULT NOTE  Pharmacy Consult for Heparin (while apixaban on hold) Indication: atrial fibrillation  No Known Allergies  Patient Measurements: Height: 5\' 11"  (180.3 cm) Weight: 198 lb (89.812 kg) IBW/kg (Calculated) : 75.3 Hepa Vital Signs: Temp: 99.9 F (37.7 C) (05/10 1615) BP: 105/74 mmHg (05/10 1615) Pulse Rate: 65 (05/10 1615)  Labs:  Recent Labs  01/31/16 0300 01/31/16 0323 01/31/16 0741 01/31/16 1416  HGB 17.6* 20.1*  --   --   HCT 54.9* 59.0*  --   --   PLT 75*  --   --   --   APTT 26  --   --   --   LABPROT 16.0*  --   --   --   INR 1.27  --   --   --   CREATININE 2.13* 1.80*  --   --   TROPONINI  --   --  0.75* 17.54*    Estimated Creatinine Clearance: 44.2 mL/min (by C-G formula based on Cr of 1.8).   Medical History: Past Medical History  Diagnosis Date  . PAD (peripheral artery disease) (HCC)   . Hypertension   . Cocaine abuse   . ETOH abuse   . Tobacco abuse   . Cholelithiases 05/01/2012  . Dysrhythmia     afib  . CHF (congestive heart failure) (HCC)   . Blood transfusion without reported diagnosis   . Hepatitis C antibody test positive 04/2012  . Claudication (HCC)     bilateral  . Hyperpigmentation   . Thrombocytopenia (HCC)   . Persistent atrial fibrillation (HCC) 08/23/2014    Assessment: 65 y/o M who came in as CODE STROKE, seizing upon arrival, pt is on apixaban PTA for afib, will be holding apixaban and starting heparin while intubated, will obtain baseline heparin level/aPTT. Will likely be using aPTT to dose for now given apixaban influence on anti-Xa levels. Pt received heparin 5000 units subcutaneously at 1400.  Goal of Therapy:  Heparin level 0.3-0.5 units/mL APTT 66-84 secs Monitor platelets by anticoagulation protocol: Yes   Plan:  Start heparin at 1200 units/hr Daily CBC/HL/aPTT Monitor s/sx of bleeding  Arlean Hopping. Newman Pies, PharmD, BCPS Clinical Pharmacist Pager [phone] 01/31/2016,4:32 PM

## 2016-01-31 NOTE — Consult Note (Signed)
Admission H&P    Chief Complaint: Status epilepticus.  HPI: Kevin [name] is an 65 y.o. male history of hypertension, atrial fibrillation on Eliquis, peripheral artery disease, alcohol and cocaine abuse, brought to the emergency room with altered mental status and generalized seizure activity. Patient was noted to have eyes deviated to the right side. He has no history of seizures. He was intubated in the emergency room and placed on mechanical ventilation. He was also started on propofol for sedation. He received a total of 4 mg of Ativan IV and was also given 1 g of Keppra IV. CT scan of his head showed an equivocal subacute right occipital infarction, but was otherwise unremarkable, including no signs of an acute intracranial hemorrhage. Blood pressure was markedly elevated at 250/150 on arrival. There was no significant drop in blood pressure after starting propofol. Cardene drip was started. Serum glucose was 187. Alcohol level was undetectable. Serum sodium was 152, potassium 4.6 and calcium was 10.8. Urine drug screen is pending.  Past Medical History  Diagnosis Date  . PAD (peripheral artery disease) ([place])   . Hypertension   . Cocaine abuse   . ETOH abuse   . Tobacco abuse   . Cholelithiases 05/01/2012  . Dysrhythmia     afib  . CHF (congestive heart failure) ([place])   . Blood transfusion without reported diagnosis   . Hepatitis C antibody test positive 04/2012  . Claudication ([place])     bilateral  . Hyperpigmentation   . Thrombocytopenia ([place])   . Persistent atrial fibrillation ([place]) 08/23/2014    Past Surgical History  Procedure Laterality Date  . Endovascular stent insertion  right leg  . Tee without cardioversion  05/05/2012    Procedure: TRANSESOPHAGEAL ECHOCARDIOGRAM (TEE);  Surgeon: Sanda Klein, MD;  Location: [facility];  Service: Cardiovascular;  Laterality: N/A;  . Cardioversion  05/05/2012    Procedure: CARDIOVERSION;  Surgeon: Sanda Klein, MD;  Location: [facility]  ENDOSCOPY;  Service: Cardiovascular;  Laterality: N/A;  . Cardiolite      negative for ischemia  . Carotid artery angioplasty    . Doppler echocardiography    . Cardiovascular stress test    . Left heart catheterization with coronary angiogram N/A 05/01/2012    Procedure: LEFT HEART CATHETERIZATION WITH CORONARY ANGIOGRAM;  Surgeon: Leonie Man, MD;  Location: [facility] CATH LAB;  Service: Cardiovascular;  Laterality: N/A;  . Left heart catheterization with coronary angiogram N/A 09/27/2014    Procedure: LEFT HEART CATHETERIZATION WITH CORONARY ANGIOGRAM;  Surgeon: Pixie Casino, MD;  Location: [facility] CATH LAB;  Service: Cardiovascular;  Laterality: N/A;    Family History  Problem Relation Age of Onset  . Seizures Daughter   . Cancer Mother    Social History:  reports that he has been smoking Cigarettes.  He has a 10 pack-year smoking history. He has never used smokeless tobacco. He reports that he drinks alcohol. He reports that he does not use illicit drugs.  Allergies: No Known Allergies  Medications: Patient's preadmission medications were reviewed by me.  ROS: Unavailable due to patient's unresponsive state.  Physical Examination: Height '5\' 10"'  (1.778 m), weight 89.812 kg (198 lb).  HEENT-  Normocephalic, no lesions, without obvious abnormality.  Normal external eye and conjunctiva.  Normal TM's bilaterally.  Normal auditory canals and external ears. Normal external nose, mucus membranes and septum.  Normal pharynx. Neck supple with no masses, nodes, nodules or enlargement. Cardiovascular - irregularly irregular rhythm, S1, S2 normal and no S3 or  S4 Lungs - chest clear, no wheezing, rales, normal symmetric air entry Abdomen - soft, non-tender; bowel sounds normal; no masses,  no organomegaly Extremities - no joint deformities, effusion, or inflammation and no edema  Neurologic Examination: Patient was intubated and on mechanical ventilation. No spontaneous respirations were noted.  He was unresponsive to external stimuli, including noxious stimuli. Right pupil was slightly larger than the left and neither pupil reactive to light. Extraocular movements were absent with oculocephalic maneuvers. Face was symmetrical with no focal weakness. Muscle tone was flaccid throughout. No spontaneous movements were noted. He also had no withdrawal movements to noxious stimuli. Deep tendon reflexes were absent throughout. Plantar responses were mute.  Results for orders placed or performed during the hospital encounter of 01/31/16 (from the past 48 hour(s))  Ethanol     Status: None   Collection Time: 01/31/16  3:00 AM  Result Value Ref Range   Alcohol, Ethyl (B) <5 <5 mg/dL    Comment:        LOWEST DETECTABLE LIMIT FOR SERUM ALCOHOL IS 5 mg/dL FOR MEDICAL PURPOSES ONLY   Protime-INR     Status: Abnormal   Collection Time: 01/31/16  3:00 AM  Result Value Ref Range   Prothrombin Time 16.0 (H) 11.6 - 15.2 seconds   INR 1.27 0.00 - 1.49  APTT     Status: None   Collection Time: 01/31/16  3:00 AM  Result Value Ref Range   aPTT 26 24 - 37 seconds  CBC     Status: Abnormal   Collection Time: 01/31/16  3:00 AM  Result Value Ref Range   WBC 12.1 (H) 4.0 - 10.5 K/uL   RBC 5.33 4.22 - 5.81 MIL/uL   Hemoglobin 17.6 (H) 13.0 - 17.0 g/dL   HCT 54.9 (H) 39.0 - 52.0 %   MCV 103.0 (H) 78.0 - 100.0 fL   MCH 33.0 26.0 - 34.0 pg   MCHC 32.1 30.0 - 36.0 g/dL   RDW 12.4 11.5 - 15.5 %   Platelets 75 (L) 150 - 400 K/uL    Comment: REPEATED TO VERIFY SPECIMEN CHECKED FOR CLOTS PLATELET COUNT CONFIRMED BY SMEAR   Differential     Status: Abnormal   Collection Time: 01/31/16  3:00 AM  Result Value Ref Range   Neutrophils Relative % 63 %   Lymphocytes Relative 20 %   Monocytes Relative 15 %   Eosinophils Relative 1 %   Basophils Relative 1 %   Neutro Abs 7.7 1.7 - 7.7 K/uL   Lymphs Abs 2.4 0.7 - 4.0 K/uL   Monocytes Absolute 1.8 (H) 0.1 - 1.0 K/uL   Eosinophils Absolute 0.1 0.0 -  0.7 K/uL   Basophils Absolute 0.1 0.0 - 0.1 K/uL   WBC Morphology ATYPICAL LYMPHOCYTES    Smear Review LARGE PLATELETS PRESENT   Comprehensive metabolic panel     Status: Abnormal   Collection Time: 01/31/16  3:00 AM  Result Value Ref Range   Sodium 152 (H) 135 - 145 mmol/L   Potassium 4.6 3.5 - 5.1 mmol/L   Chloride 107 101 - 111 mmol/L   CO2 13 (L) 22 - 32 mmol/L   Glucose, Bld 186 (H) 65 - 99 mg/dL   BUN 20 6 - 20 mg/dL   Creatinine, Ser 2.13 (H) 0.61 - 1.24 mg/dL   Calcium 10.8 (H) 8.9 - 10.3 mg/dL   Total Protein 8.8 (H) 6.5 - 8.1 g/dL   Albumin 4.6 3.5 - 5.0 g/dL   AST  41 15 - 41 U/L   ALT 30 17 - 63 U/L   Alkaline Phosphatase 124 38 - 126 U/L   Total Bilirubin 1.3 (H) 0.3 - 1.2 mg/dL   GFR calc non Af Amer 31 (L) >60 mL/min   GFR calc Af Amer 36 (L) >60 mL/min    Comment: (NOTE) The eGFR has been calculated using the CKD EPI equation. This calculation has not been validated in all clinical situations. eGFR's persistently <60 mL/min signify possible Chronic Kidney Disease.    Anion gap 32 (H) 5 - 15  CBG monitoring, ED     Status: Abnormal   Collection Time: 01/31/16  3:02 AM  Result Value Ref Range   Glucose-Capillary 187 (H) 65 - 99 mg/dL  I-Stat CG4 Lactic Acid, ED     Status: Abnormal   Collection Time: 01/31/16  3:23 AM  Result Value Ref Range   Lactic Acid, Venous >17.00 (HH) 0.5 - 2.0 mmol/L   Comment NOTIFIED PHYSICIAN   I-stat troponin, ED (not at [facility])     Status: None   Collection Time: 01/31/16  3:33 AM  Result Value Ref Range   Troponin i, poc 0.03 0.00 - 0.08 ng/mL   Comment 3            Comment: Due to the release kinetics of cTnI, a negative result within the first hours of the onset of symptoms does not rule out myocardial infarction with certainty. If myocardial infarction is still suspected, repeat the test at appropriate intervals.    Ct Head Wo Contrast  01/31/2016  CLINICAL DATA:  Altered mental status. EXAM: CT HEAD WITHOUT  CONTRAST TECHNIQUE: Contiguous axial images were obtained from the base of the skull through the vertex without intravenous contrast. COMPARISON:  08/23/2015 FINDINGS: There is no intracranial hemorrhage or extra-axial fluid collection. There is focal hypodensity in the right occipital region which is new, consistent with a subacute infarction. Mild sulcal effacement but no mass effect on the ventricles. No midline shift. No other significant interval change. There is unchanged mild generalized atrophy. There is extensive white matter hypodensity which is unchanged, likely due to small vessel ischemic disease. There is a bony defect of the right posterior parietal calvarium which is chronic and unchanged. No acute bony abnormality is evident. IMPRESSION: Small subacute infarction of the right occipital lobe, nonhemorrhagic. No midline shift. This is superimposed on chronic changes of mild atrophy and white matter hypodensities which are likely due to small vessel disease. These results were called by telephone at the time of interpretation on 01/31/2016 at 3:54 am to Dr. Wallie Char, who verbally acknowledged these results. Electronically Signed   By: Andreas Newport M.D.   On: 01/31/2016 03:54   Dg Chest Portable 1 View  01/31/2016  CLINICAL DATA:  Respiratory failure EXAM: PORTABLE CHEST 1 VIEW COMPARISON:  04/19/2014 FINDINGS: Endotracheal tube tip is above the level of the clavicles and should be advanced. I reported this by phone to Dr. Rex Kras, and she had already seen this and advanced the tube. There is mild unchanged right hemidiaphragm elevation. There is mild patchy opacity in the left upper lobe medially which could represent atelectasis, aspiration, infectious infiltrate or hemorrhage. No pneumothorax. No large effusion. IMPRESSION: ET tube is high, as described above. This has already been advanced by the attending physician. Mild patchy opacity in the medial left upper lobe. Electronically  Signed   By: Andreas Newport M.D.   On: 01/31/2016 03:58  Assessment/Plan 65 year old man with no known history of seizure activity presenting with generalized status epilepticus. Etiology is unclear. CT scan showed no acute intracranial abnormality, including signs of acute hemorrhage. Patient is so presenting with hypertensive emergency requiring intervention with Cardene drip for management. Hypertensive encephalopathy cannot be ruled out.  Recommendations: 1. Continue Keppra at 1000 mg IV every 12 hours 2. Will also continue propofol as needed for sedation and seizure control 3. BP management per CCM 4. EEG, with continuous monitoring if seizure activity is demonstrated 5. MRI of the brain without contrast when feasible  This patient is critically ill and at significant risk of neurological worsening or death, and care requires constant monitoring of vital signs, hemodynamics,respiratory and cardiac monitoring, neurological assessment, discussion with family, other specialists and medical decision making of high complexity. Total critical care time was 60 minutes.  C.R. Nicole Kindred, [place] [facility] Neuro[facility]ospil[facility]ist [phone]  01/31/2016, 4:02 AM

## 2016-02-01 ENCOUNTER — Inpatient Hospital Stay (HOSPITAL_COMMUNITY): Payer: Managed Care, Other (non HMO)

## 2016-02-01 ENCOUNTER — Encounter (HOSPITAL_COMMUNITY): Payer: Self-pay | Admitting: Cardiology

## 2016-02-01 DIAGNOSIS — I2510 Atherosclerotic heart disease of native coronary artery without angina pectoris: Secondary | ICD-10-CM

## 2016-02-01 DIAGNOSIS — I214 Non-ST elevation (NSTEMI) myocardial infarction: Secondary | ICD-10-CM | POA: Diagnosis present

## 2016-02-01 DIAGNOSIS — G40901 Epilepsy, unspecified, not intractable, with status epilepticus: Secondary | ICD-10-CM

## 2016-02-01 DIAGNOSIS — G9340 Encephalopathy, unspecified: Secondary | ICD-10-CM

## 2016-02-01 DIAGNOSIS — I10 Essential (primary) hypertension: Secondary | ICD-10-CM

## 2016-02-01 DIAGNOSIS — I4821 Permanent atrial fibrillation: Secondary | ICD-10-CM

## 2016-02-01 DIAGNOSIS — J9601 Acute respiratory failure with hypoxia: Secondary | ICD-10-CM

## 2016-02-01 HISTORY — DX: Permanent atrial fibrillation: I48.21

## 2016-02-01 HISTORY — DX: Atherosclerotic heart disease of native coronary artery without angina pectoris: I25.10

## 2016-02-01 LAB — PHOSPHORUS: Phosphorus: 3.4 mg/dL (ref 2.5–4.6)

## 2016-02-01 LAB — BLOOD CULTURE ID PANEL (REFLEXED)
Acinetobacter baumannii: NOT DETECTED
Candida albicans: NOT DETECTED
Candida glabrata: NOT DETECTED
Candida krusei: NOT DETECTED
Candida parapsilosis: NOT DETECTED
Candida tropicalis: NOT DETECTED
Carbapenem resistance: NOT DETECTED
Enterobacter cloacae complex: NOT DETECTED
Enterobacteriaceae species: NOT DETECTED
Enterococcus species: NOT DETECTED
Escherichia coli: NOT DETECTED
Haemophilus influenzae: NOT DETECTED
Klebsiella oxytoca: NOT DETECTED
Klebsiella pneumoniae: NOT DETECTED
Listeria monocytogenes: NOT DETECTED
Methicillin resistance: NOT DETECTED
Neisseria meningitidis: NOT DETECTED
Proteus species: NOT DETECTED
Pseudomonas aeruginosa: NOT DETECTED
Serratia marcescens: NOT DETECTED
Staphylococcus aureus (BCID): NOT DETECTED
Staphylococcus species: DETECTED — AB
Streptococcus agalactiae: NOT DETECTED
Streptococcus pneumoniae: NOT DETECTED
Streptococcus pyogenes: NOT DETECTED
Streptococcus species: NOT DETECTED
Vancomycin resistance: NOT DETECTED

## 2016-02-01 LAB — BASIC METABOLIC PANEL
Anion gap: 12 (ref 5–15)
BUN: 23 mg/dL — ABNORMAL HIGH (ref 6–20)
CO2: 19 mmol/L — ABNORMAL LOW (ref 22–32)
Calcium: 8 mg/dL — ABNORMAL LOW (ref 8.9–10.3)
Chloride: 108 mmol/L (ref 101–111)
Creatinine, Ser: 1.9 mg/dL — ABNORMAL HIGH (ref 0.61–1.24)
GFR calc Af Amer: 41 mL/min — ABNORMAL LOW (ref >60)
GFR calc non Af Amer: 36 mL/min — ABNORMAL LOW (ref >60)
Glucose, Bld: 149 mg/dL — ABNORMAL HIGH (ref 65–99)
Potassium: 3.3 mmol/L — ABNORMAL LOW (ref 3.5–5.1)
Sodium: 139 mmol/L (ref 135–145)

## 2016-02-01 LAB — ECHOCARDIOGRAM COMPLETE
Height: 71 in
Weight: 3470.92 oz

## 2016-02-01 LAB — HEMOGLOBIN A1C
Hgb A1c MFr Bld: 4.9 % (ref 4.8–5.6)
Mean Plasma Glucose: 94 mg/dL

## 2016-02-01 LAB — CBC
HCT: 38.5 % — ABNORMAL LOW (ref 39.0–52.0)
Hemoglobin: 13.3 g/dL (ref 13.0–17.0)
MCH: 33.7 pg (ref 26.0–34.0)
MCHC: 34.5 g/dL (ref 30.0–36.0)
MCV: 97.5 fL (ref 78.0–100.0)
Platelets: 69 10*3/uL — ABNORMAL LOW (ref 150–400)
RBC: 3.95 MIL/uL — ABNORMAL LOW (ref 4.22–5.81)
RDW: 12.5 % (ref 11.5–15.5)
WBC: 11.7 10*3/uL — ABNORMAL HIGH (ref 4.0–10.5)

## 2016-02-01 LAB — APTT
aPTT: 40 seconds — ABNORMAL HIGH (ref 24–37)
aPTT: 74 seconds — ABNORMAL HIGH (ref 24–37)
aPTT: 92 seconds — ABNORMAL HIGH (ref 24–37)

## 2016-02-01 LAB — HEPARIN LEVEL (UNFRACTIONATED)
Heparin Unfractionated: 0.16 IU/mL — ABNORMAL LOW (ref 0.30–0.70)
Heparin Unfractionated: 0.41 IU/mL (ref 0.30–0.70)
Heparin Unfractionated: 0.51 IU/mL (ref 0.30–0.70)

## 2016-02-01 LAB — MAGNESIUM: Magnesium: 2.3 mg/dL (ref 1.7–2.4)

## 2016-02-01 LAB — GLUCOSE, CAPILLARY
Glucose-Capillary: 183 mg/dL — ABNORMAL HIGH (ref 65–99)
Glucose-Capillary: 131 mg/dL — ABNORMAL HIGH (ref 65–99)
Glucose-Capillary: 119 mg/dL — ABNORMAL HIGH (ref 65–99)
Glucose-Capillary: 125 mg/dL — ABNORMAL HIGH (ref 65–99)
Glucose-Capillary: 137 mg/dL — ABNORMAL HIGH (ref 65–99)
Glucose-Capillary: 152 mg/dL — ABNORMAL HIGH (ref 65–99)

## 2016-02-01 LAB — POCT I-STAT 3, ART BLOOD GAS (G3+)
Acid-base deficit: 5 mmol/L — ABNORMAL HIGH (ref 0.0–2.0)
Bicarbonate: 18.5 mEq/L — ABNORMAL LOW (ref 20.0–24.0)
O2 Saturation: 98 %
Patient temperature: 97.6
TCO2: 19 mmol/L (ref 0–100)
pCO2 arterial: 29.8 mmHg — ABNORMAL LOW (ref 35.0–45.0)
pH, Arterial: 7.399 (ref 7.350–7.450)
pO2, Arterial: 107 mmHg — ABNORMAL HIGH (ref 80.0–100.0)

## 2016-02-01 LAB — URINE CULTURE
Culture: NO GROWTH
Special Requests: NORMAL

## 2016-02-01 MED ORDER — POTASSIUM CHLORIDE 20 MEQ/15ML (10%) PO SOLN
40.0000 meq | Freq: Once | ORAL | Status: AC
  Administered 2016-02-01: 40 meq
  Filled 2016-02-01: qty 30

## 2016-02-01 MED ORDER — ANTISEPTIC ORAL RINSE SOLUTION (CORINZ)
7.0000 mL | Freq: Four times a day (QID) | OROMUCOSAL | Status: DC
  Administered 2016-02-01 – 2016-02-03 (×8): 7 mL via OROMUCOSAL

## 2016-02-01 MED ORDER — FOLIC ACID 1 MG PO TABS
1.0000 mg | ORAL_TABLET | Freq: Every day | ORAL | Status: DC
  Administered 2016-02-01 – 2016-02-04 (×4): 1 mg
  Filled 2016-02-01 (×4): qty 1

## 2016-02-01 MED ORDER — VITAMIN B-1 100 MG PO TABS
100.0000 mg | ORAL_TABLET | Freq: Every day | ORAL | Status: DC
  Administered 2016-02-02 – 2016-02-04 (×3): 100 mg
  Filled 2016-02-01 (×3): qty 1

## 2016-02-01 MED ORDER — VANCOMYCIN HCL 10 G IV SOLR
1500.0000 mg | INTRAVENOUS | Status: DC
  Filled 2016-02-01: qty 1500

## 2016-02-01 MED ORDER — LABETALOL HCL 5 MG/ML IV SOLN
10.0000 mg | INTRAVENOUS | Status: DC | PRN
  Administered 2016-02-04: 10 mg via INTRAVENOUS
  Administered 2016-02-05 – 2016-02-06 (×7): 20 mg via INTRAVENOUS
  Filled 2016-02-01 (×8): qty 4

## 2016-02-01 MED ORDER — SODIUM CHLORIDE 0.9 % IV SOLN
1000.0000 mg | Freq: Two times a day (BID) | INTRAVENOUS | Status: DC
  Administered 2016-02-01 – 2016-02-07 (×12): 1000 mg via INTRAVENOUS
  Filled 2016-02-01 (×15): qty 10

## 2016-02-01 NOTE — Progress Notes (Signed)
eLink Physician-Brief Progress Note Patient Name: NELLY DEVERS DOB: [birth year] MRN: [id number]   Date of Service  02/01/2016  HPI/Events of Note  Hypokalemia  eICU Interventions  Potassium replaced     Intervention Category Intermediate Interventions: Electrolyte abnormality - evaluation and management  DETERDING,ELIZABETH 02/01/2016, 6:19 AM

## 2016-02-01 NOTE — Progress Notes (Signed)
Subjective: Continues to be encephalopathic.  Concern for some subtle seizures yesterday.   Exam: Filed Vitals:   02/01/16 0730 02/01/16 0800  BP: 143/86 117/76  Pulse: 121 51  Temp:  97.4 F (36.3 C)  Resp: 21 20   Gen: In bed, NAD Resp: non-labored breathing, no acute distress Abd: soft, nt  Neuro: MS: does not open eyes. Does not follow commands.  QA:ESLPN pupil larger than left, eyes dysconjugate. Patient resists doll's eye maneuver, cough present.  Motor: withdraws minimally to noxious stimulation x 4.  Sensory:as above.   Pertinent Labs: Bmp - elevated creatinine  Impression: 65 yo M presented in status epilepticus, stopped by the time of EEG yesterday. Based on renal function, keppra max dose is 1gm BID.   Recommendations: 1) decerase keppra to 1gm BID 2) Given persistent unresponsive state and concern for subtle seizures, will need continuous EEG to rule out frequent seizures as contributing to his mental status.  3) will follow.   Ritta Slot, MD [facility] [phone]  If 7pm- 7am, please page neurology on call as listed in AMION.

## 2016-02-01 NOTE — Progress Notes (Signed)
Pt profile:  65 year old male with permanent a fib, CAD, last cath 2016, HTN, cocaine abuse, TIA and Hep C and noncompliant now admitted with Code Stroke and then seizure.  He previously had not been on anticoagulation due to his thrombocytopenia and noncompliance.   Per this admit was on Eliquis pro-time on admit was 16.  On vent does not follow commands   Pt now on continuous EEG monitoring.   Subjective: On vent  Objective: Vital signs in last 24 hours: Temp:  [97.3 F (36.3 C)-100.6 F (38.1 C)] 97.3 F (36.3 C) (05/11 1150) Pulse Rate:  [47-154] 154 (05/11 1200) Resp:  [14-27] 20 (05/11 1200) BP: (84-171)/(40-118) 124/94 mmHg (05/11 1200) SpO2:  [94 %-100 %] 99 % (05/11 1200) FiO2 (%):  [40 %-60 %] 40 % (05/11 1200) Weight:  [216 lb 14.9 oz (98.4 kg)] 216 lb 14.9 oz (98.4 kg) (05/11 0442) Weight change: 18 lb 14.9 oz (8.588 kg)   Intake/Output from previous day:  +4781 05/10 0701 - 05/11 0700 In: 6259.1 [I.V.:3775.3; NG/GT:1853.8; IV Piggyback:630] Out: 1285 [Urine:1285] Intake/Output this shift: Total I/O In: 639.5 [I.V.:564.5; NG/GT:75] Out: 650 [Urine:650]  PE: General: Intubated on vent not responding- is sedated,  continuous EEG monitoring Skin:cool ext  and dry, brisk capillary refill HEENT:normocephalic,  mucus membranes moist Neck:supple, no JVD  Heart: irreg irreg  Rapid without murmur, gallup, rub or click Lungs: without rales,  Ant. occ rhonchi, no wheezes TIW:PYKD, non tender, + BS, do not palpate liver spleen or masses Ext:no lower ext edema, ? pedal pulses, 2+ radial pulses Neuro: does not respond   Lab Results:  Recent Labs  01/31/16 0300 01/31/16 0323 02/01/16 0326  WBC 12.1*  --  11.7*  HGB 17.6* 20.1* 13.3  HCT 54.9* 59.0* 38.5*  PLT 75*  --  69*   BMET  Recent Labs  01/31/16 0300 01/31/16 0323 02/01/16 0326  NA 152* 149* 139  K 4.6 4.4 3.3*  CL 107 113* 108  CO2 13*  --  19*  GLUCOSE 186* 165* 149*  BUN 20 27* 23*    CREATININE 2.13* 1.80* 1.90*  CALCIUM 10.8*  --  8.0*    Recent Labs  01/31/16 0741 01/31/16 1416  TROPONINI 0.75* 17.54*    Lab Results  Component Value Date   CHOL 182 08/24/2015   HDL 57 08/24/2015   LDLCALC 112* 08/24/2015   TRIG 1372* 01/31/2016   CHOLHDL 3.2 08/24/2015   Lab Results  Component Value Date   HGBA1C 4.9 01/31/2016     Lab Results  Component Value Date   TSH 4.220 04/19/2014    Hepatic Function Panel  Recent Labs  01/31/16 0300  PROT 8.8*  ALBUMIN 4.6  AST 41  ALT 30  ALKPHOS 124  BILITOT 1.3*   No results for input(s): CHOL in the last 72 hours. No results for input(s): PROTIME in the last 72 hours.     Studies/Results: Ct Head Wo Contrast  01/31/2016  CLINICAL DATA:  Altered mental status. EXAM: CT HEAD WITHOUT CONTRAST TECHNIQUE: Contiguous axial images were obtained from the base of the skull through the vertex without intravenous contrast. COMPARISON:  08/23/2015 FINDINGS: There is no intracranial hemorrhage or extra-axial fluid collection. There is focal hypodensity in the right occipital region which is new, consistent with a subacute infarction. Mild sulcal effacement but no mass effect on the ventricles. No midline shift. No other significant interval change. There is unchanged mild generalized atrophy.  There is extensive white matter hypodensity which is unchanged, likely due to small vessel ischemic disease. There is a bony defect of the right posterior parietal calvarium which is chronic and unchanged. No acute bony abnormality is evident. IMPRESSION: Small subacute infarction of the right occipital lobe, nonhemorrhagic. No midline shift. This is superimposed on chronic changes of mild atrophy and white matter hypodensities which are likely due to small vessel disease. These results were called by telephone at the time of interpretation on 01/31/2016 at 3:54 am to Dr. Wallie Char, who verbally acknowledged these results.  Electronically Signed   By: Andreas Newport M.D.   On: 01/31/2016 03:54   Mr Jodene Nam Head Wo Contrast  01/31/2016  CLINICAL DATA:  Seizures.  Abnormal CT. EXAM: MRI HEAD WITHOUT CONTRAST MRA HEAD WITHOUT CONTRAST TECHNIQUE: Multiplanar, multiecho pulse sequences of the brain and surrounding structures were obtained without intravenous contrast. Angiographic images of the head were obtained using MRA technique without contrast. COMPARISON:  None. FINDINGS: MRI HEAD FINDINGS Areas of restricted diffusion trace signal abnormality are present in the right centrum semi ovale and right occipital lobe. There is no definite restricted diffusion in the occipital lobe. There is minimal restricted diffusion in the right centrum semi ovale. Extensive white matter changes are present bilaterally. Focal white matter changes correspond with the areas of diffusion trace signal in the right centrum semi ovale and right occipital lobe. The right occipital area appears represent remote encephalomalacia. Remote lacunar infarcts are present in the basal ganglia and right thalamus. The brainstem and cerebellum are normal. The internal auditory canals are within normal limits bilaterally. Flow is present in the major intracranial arteries. The globes and orbits are intact. A fluid level is present in the left maxillary sinus. Mild mucosal thickening is present throughout the maxillary sinus. A fluid level is present in the nasopharynx. There is some fluid in the right mastoid air cells. The skullbase is otherwise within normal limits. Midline sagittal images are unremarkable. MRA HEAD FINDINGS Atherosclerotic changes are present within the cavernous internal carotid arteries bilaterally without focal stenosis. The A1 and M1 segments are normal. There is mild narrowing of the distal left M1 segment. Signal loss in the posterior right M2 segment suggests severe stenosis. The superior segment is not well seen. There is significant  attenuation of MCA branch vessels bilaterally. The right vertebral artery is slightly dominant to the left. The PICA origin is visualized. Atherosclerotic changes are present throughout the the basilar artery without focal stenosis. Both posterior cerebral arteries originate the basilar tip. Moderate narrowing is present in the distal P2 segments bilaterally. The distal PCA branch vessels are poorly visualized. IMPRESSION: 1. Focal area of remote encephalomalacia involving the right occipital lobe. T2 shine through is evident on the diffusion-weighted images. 2. Indeterminate area of diffusion abnormality in the right centrum semi ovale. This may also represent T2 shine through or a late subacute infarct. Repeat MRI in 1-2 months could be used for further evaluation if clinically indicated. 3. Moderate to severe stenosis of the posterior inferior right M2 segment. 4. Mild narrowing of the distal left M1 segment. 5. Mild to moderate narrowing of distal P2 segments bilaterally. 6. Diffuse distal small vessel disease evident on the MRA. Electronically Signed   By: San Morelle M.D.   On: 01/31/2016 19:13   Mr Brain Wo Contrast  01/31/2016  CLINICAL DATA:  Seizures.  Abnormal CT. EXAM: MRI HEAD WITHOUT CONTRAST MRA HEAD WITHOUT CONTRAST TECHNIQUE: Multiplanar, multiecho pulse sequences of  the brain and surrounding structures were obtained without intravenous contrast. Angiographic images of the head were obtained using MRA technique without contrast. COMPARISON:  None. FINDINGS: MRI HEAD FINDINGS Areas of restricted diffusion trace signal abnormality are present in the right centrum semi ovale and right occipital lobe. There is no definite restricted diffusion in the occipital lobe. There is minimal restricted diffusion in the right centrum semi ovale. Extensive white matter changes are present bilaterally. Focal white matter changes correspond with the areas of diffusion trace signal in the right centrum  semi ovale and right occipital lobe. The right occipital area appears represent remote encephalomalacia. Remote lacunar infarcts are present in the basal ganglia and right thalamus. The brainstem and cerebellum are normal. The internal auditory canals are within normal limits bilaterally. Flow is present in the major intracranial arteries. The globes and orbits are intact. A fluid level is present in the left maxillary sinus. Mild mucosal thickening is present throughout the maxillary sinus. A fluid level is present in the nasopharynx. There is some fluid in the right mastoid air cells. The skullbase is otherwise within normal limits. Midline sagittal images are unremarkable. MRA HEAD FINDINGS Atherosclerotic changes are present within the cavernous internal carotid arteries bilaterally without focal stenosis. The A1 and M1 segments are normal. There is mild narrowing of the distal left M1 segment. Signal loss in the posterior right M2 segment suggests severe stenosis. The superior segment is not well seen. There is significant attenuation of MCA branch vessels bilaterally. The right vertebral artery is slightly dominant to the left. The PICA origin is visualized. Atherosclerotic changes are present throughout the the basilar artery without focal stenosis. Both posterior cerebral arteries originate the basilar tip. Moderate narrowing is present in the distal P2 segments bilaterally. The distal PCA branch vessels are poorly visualized. IMPRESSION: 1. Focal area of remote encephalomalacia involving the right occipital lobe. T2 shine through is evident on the diffusion-weighted images. 2. Indeterminate area of diffusion abnormality in the right centrum semi ovale. This may also represent T2 shine through or a late subacute infarct. Repeat MRI in 1-2 months could be used for further evaluation if clinically indicated. 3. Moderate to severe stenosis of the posterior inferior right M2 segment. 4. Mild narrowing of the  distal left M1 segment. 5. Mild to moderate narrowing of distal P2 segments bilaterally. 6. Diffuse distal small vessel disease evident on the MRA. Electronically Signed   By: San Morelle M.D.   On: 01/31/2016 19:13   Dg Chest Port 1 View  02/01/2016  CLINICAL DATA:  Check endotracheal tube placement EXAM: PORTABLE CHEST 1 VIEW COMPARISON:  01/31/2016 FINDINGS: Endotracheal tube appears have been advanced and now lies 2.4 cm above the carina. A nasogastric catheter is seen within the stomach. The proximal side port lies at the gastroesophageal junction. The cardiac shadow is stable. The lungs are well aerated bilaterally without focal infiltrate. No bony abnormality is seen. IMPRESSION: No acute abnormality noted. Electronically Signed   By: Inez Catalina M.D.   On: 02/01/2016 07:41   Dg Chest Port 1 View  01/31/2016  CLINICAL DATA:  Initial evaluation for endotracheal tube placement. EXAM: PORTABLE CHEST 1 VIEW COMPARISON:  Prior radiograph from earlier the same day. FINDINGS: The endotracheal tube has been advanced, with tip now positioned approximately 3.8 cm above the carina. Enteric tube in place with side hole the at or just beyond the GE junction. Consider advancement by I 3 cm. Cardiac mediastinal silhouettes are stable. Lungs are hypoinflated.  Similar left upper lobe opacity. Right perihilar/basilar linear opacity favored to reflect atelectasis. No pulmonary edema or pleural effusion. No pneumothorax. No acute osseous abnormality. IMPRESSION: 1. Tip of the endotracheal to Ing could position approximately 3.8 cm above the carina. 2. Interval placement of enteric to with side hole at for just beyond the GE junction. Consider advancement by approximately 3 cm to insure adequate placement within the stomach. 3. Similar patchy left upper lobe opacity, which may reflect atelectasis or infiltrate. 4. Mild right perihilar atelectasis. Electronically Signed   By: Jeannine Boga M.D.   On:  01/31/2016 05:56   Dg Chest Portable 1 View  01/31/2016  CLINICAL DATA:  Respiratory failure EXAM: PORTABLE CHEST 1 VIEW COMPARISON:  04/19/2014 FINDINGS: Endotracheal tube tip is above the level of the clavicles and should be advanced. I reported this by phone to Dr. Rex Kras, and she had already seen this and advanced the tube. There is mild unchanged right hemidiaphragm elevation. There is mild patchy opacity in the left upper lobe medially which could represent atelectasis, aspiration, infectious infiltrate or hemorrhage. No pneumothorax. No large effusion. IMPRESSION: ET tube is high, as described above. This has already been advanced by the attending physician. Mild patchy opacity in the medial left upper lobe. Electronically Signed   By: Andreas Newport M.D.   On: 01/31/2016 03:58    Medications: I have reviewed the patient's current medications. Scheduled Meds: . ampicillin-sulbactam (UNASYN) IV  3 g Intravenous Q8H  . antiseptic oral rinse  7 mL Mouth Rinse QID  . aspirin  81 mg Per Tube Daily  . chlorhexidine gluconate (SAGE KIT)  15 mL Mouth Rinse BID  . folic acid  1 mg Per Tube Daily  . free water  250 mL Per Tube Q6H  . insulin aspart  0-15 Units Subcutaneous Q4H  . levETIRAcetam  1,000 mg Intravenous Q12H  . metoprolol tartrate  25 mg Oral BID  . pantoprazole (PROTONIX) IV  40 mg Intravenous QHS  . rosuvastatin  20 mg Per Tube q1800  . [START ON 02/02/2016] thiamine  100 mg Per Tube Daily   Continuous Infusions: . sodium chloride 100 mL/hr at 02/01/16 1139  . feeding supplement (VITAL AF 1.2 CAL) 1,000 mL (02/01/16 1200)  . heparin 1,350 Units/hr (02/01/16 1142)  . midazolam (VERSED) infusion 3 mg/hr (02/01/16 1015)  . niCARDipine Stopped (02/01/16 0600)   PRN Meds:.sodium chloride, acetaminophen, fentaNYL (SUBLIMAZE) injection, fentaNYL (SUBLIMAZE) injection, labetalol, metoprolol  Assessment/Plan: Principal Problem:   Status epilepticus ([place]) Active Problems:   PAD  (peripheral artery disease), PTA to Lt. SFA in 2009, total occlusion mid Rt SFA with collaterals    HTN (hypertension) -Accelerated with End organ involvement   ETOH abuse   NSVT (nonsustained ventricular tachycardia) ([facility])   Thrombocytopenia on admission 04/30/12, thought to be secondary to Xarelto, Platelets no better 2015   Hepatitis C antibody test positive   H/O medication noncompliance   Seizures ([place])   CAD in native artery cath 2016 90% mRamus    Atrial fibrillation, permanent (HCC)  Permanent a fib- had previously not been on anticoagulation due to thrombocytopenia non compliance and ETOH use. CHADS2VASC score of 4. Per notes was on Eliquis as outpt.  Do not know if taking.   Pt on heparin now.   CAD last cath 2016 after + stress test "1. 90% tubular mid-Ramus stenosis - likely anginal culprit, given ischemia in the lateral wall on NST 2. 70% bifurcation PDA/PLB stenosis - small vessel, <2.0  mm 3. LVEDP = 20 mmHg"    Treated Medically and pt never followed up.  ELEVATED TROPONIN --echo pending  0.03--0.75--17.54   + NSTEMI  Not cath candidate currently  NSVT  Increased his BB  Non recently    LOS: 1 day   Time spent with pt. :15 minutes. [facility] R  Nurse Practitioner Certified Pager [phone] or after 5pm and on weekends call [phone] 02/01/2016, 1:00 PM   History and all data above reviewed.  Patient examined.  Intubated and sedated  I agree with the findings as above.  The patient exam reveals BVP:[id number]  ,  Lungs: clear  ,  Abd: Positive bowel sounds, no rebound no guarding, Ext No edema  .  All available labs, radiology testing, previous records reviewed. Agree with documented assessment and plan. NSTEMI:  Conservative therapy.  Not an interventional candidate.  We will follow.    Kevin [name] Iyari Hagner  3:30 PM  02/01/2016

## 2016-02-01 NOTE — Clinical Documentation Improvement (Signed)
Cardiology  Can the diagnosis of CHF be further specified?    Acuity - Acute, Chronic, Acute on Chronic   Type - Systolic, Diastolic, Systolic and Diastolic  Other  Clinically Undetermined   Document any associated diagnoses/conditions   Supporting Information: Noted in past history as having CHF with noncompliance with meds.   Please exercise your independent, professional judgment when responding. A specific answer is not anticipated or expected.   Thank Modesta Messing [facility] Health Information Management [place] [phone]

## 2016-02-01 NOTE — Progress Notes (Signed)
LTM day 1 started; educated nurse on event button. All impedances under 5 kohms.  Notified Dr Jobe Gibbon.

## 2016-02-01 NOTE — Clinical Documentation Improvement (Signed)
Pulmonology  Can the diagnosis of CKD be further specified?   CKD Stage I - GFR greater than or equal to 90  CKD Stage II - GFR 60-89  CKD Stage III - GFR 30-59  CKD Stage IV - GFR 15-29  CKD Stage V - GFR < 15  ESRD (End Stage Renal Disease)  Other condition  Unable to clinically determine   Supporting Information: : (risk factors, signs and symptoms, diagnostics, treatment) Stated has acute on chronic CKD. GFR 36, 41; Creatinine 2.13, 1.90  Please exercise your independent, professional judgment when responding. A specific answer is not anticipated or expected.   Thank Modesta Messing [facility] Health Information Management [place] [phone]

## 2016-02-01 NOTE — Progress Notes (Signed)
PULMONARY / CRITICAL CARE MEDICINE   Name: Kevin [name] MRN: [id number] DOB: 08-May-1951    ADMISSION DATE:  01/31/2016 CONSULTATION DATE:  01/31/16  REFERRING MD:  EDP  CHIEF COMPLAINT:  Seizures  HISTORY OF PRESENT ILLNESS:  Pt is encephelopathic; therefore, this HPI is obtained from chart review. Kevin [name] is a 65 y.o. male with PMH as outlined below. He was in his USOH up until early AM 01/31/16.  Wife states he went to bed and was last seen normal around 1am.  At 2am, he was vomiting and had eye deviation to the right.  EMS was summoned and pt was brought to [facility] ED for further evaluation.  He was initially brought in as code stroke.  On ED arrival, pt began to have grand mal seizure.  He received 2mg  ativan followed by 2nd dose of 2mg .  Neurology was at bedside and ordered an 1g Keppra load.  Pt was then intubated for airway protection.  Prior to intubation, he had AFRVR with HR as high as 170's.  After intubation and propofol, HR decreased to 150's.  CT of the head revealed equivocal subacute right occipital infarction but was otherwise unremarkable.  PCCM was called for admission.  SUBJECTIVE:  No events, sedate.  VITAL SIGNS: BP 117/76 mmHg  Pulse 51  Temp(Src) 97.4 F (36.3 C) (Axillary)  Resp 20  Ht 5\' 11"  (1.803 m)  Wt 98.4 kg (216 lb 14.9 oz)  BMI 30.27 kg/m2  SpO2 99%  HEMODYNAMICS:    VENTILATOR SETTINGS: Vent Mode:  [-] PRVC FiO2 (%):  [50 %-60 %] 50 % Set Rate:  [20 bmp] 20 bmp Vt Set:  [600 mL] 600 mL PEEP:  [8 cmH20] 8 cmH20 Plateau Pressure:  [19 cmH20-20 cmH20] 19 cmH20  INTAKE / OUTPUT: I/O last 3 completed shifts: In: 6259.1 [I.V.:3775.3; NG/GT:1853.8; IV Piggyback:630] Out: 1285 [Urine:1285]  PHYSICAL EXAMINATION: General: Adult male, in NAD. Neuro:  Sedated, comfortable, does not follow commands. HEENT: [place]/AT. PERRL, sclerae anicteric. Cardiovascular: RRR, no M/R/G.  Lungs: Respirations even and unlabored.  Coarse, L >  R. Abdomen: BS x 4, soft, NT/ND.  Musculoskeletal: No gross deformities, no edema.  Skin: Intact, warm, no rashes.  LABS:  BMET  Recent Labs Lab 01/31/16 0300 01/31/16 0323 02/01/16 0326  NA 152* 149* 139  K 4.6 4.4 3.3*  CL 107 113* 108  CO2 13*  --  19*  BUN 20 27* 23*  CREATININE 2.13* 1.80* 1.90*  GLUCOSE 186* 165* 149*   Electrolytes  Recent Labs Lab 01/31/16 0300 01/31/16 1415 02/01/16 0326  CALCIUM 10.8*  --  8.0*  MG  --  2.4 2.3  PHOS  --   --  3.4   CBC  Recent Labs Lab 01/31/16 0300 01/31/16 0323 02/01/16 0326  WBC 12.1*  --  11.7*  HGB 17.6* 20.1* 13.3  HCT 54.9* 59.0* 38.5*  PLT 75*  --  69*   Coag's  Recent Labs Lab 01/31/16 0300 01/31/16 1913 01/31/16 2327 02/01/16 0326  APTT 26 31 40* 74*  INR 1.27  --   --   --    Sepsis Markers  Recent Labs Lab 01/31/16 0549 01/31/16 0742 01/31/16 1138  LATICACIDVEN 7.1* 5.2* 3.7*   ABG  Recent Labs Lab 01/31/16 0542 01/31/16 0905 02/01/16 0406  PHART 7.303* 7.404 7.399  PCO2ART 37.2 31.6* 29.8*  PO2ART 69.0* 118.0* 107.0*   Liver Enzymes  Recent Labs Lab 01/31/16 0300  AST 41  ALT 30  ALKPHOS  124  BILITOT 1.3*  ALBUMIN 4.6   Cardiac Enzymes  Recent Labs Lab 01/31/16 0741 01/31/16 1416  TROPONINI 0.75* 17.54*    Glucose  Recent Labs Lab 01/31/16 1020 01/31/16 1211 01/31/16 1951 01/31/16 2309 02/01/16 0354 02/01/16 0826  GLUCAP 93 105* 138* 183* 131* 119*   Imaging Mr Mra Head Wo Contrast  01/31/2016  CLINICAL DATA:  Seizures.  Abnormal CT. EXAM: MRI HEAD WITHOUT CONTRAST MRA HEAD WITHOUT CONTRAST TECHNIQUE: Multiplanar, multiecho pulse sequences of the brain and surrounding structures were obtained without intravenous contrast. Angiographic images of the head were obtained using MRA technique without contrast. COMPARISON:  None. FINDINGS: MRI HEAD FINDINGS Areas of restricted diffusion trace signal abnormality are present in the right centrum semi ovale and  right occipital lobe. There is no definite restricted diffusion in the occipital lobe. There is minimal restricted diffusion in the right centrum semi ovale. Extensive white matter changes are present bilaterally. Focal white matter changes correspond with the areas of diffusion trace signal in the right centrum semi ovale and right occipital lobe. The right occipital area appears represent remote encephalomalacia. Remote lacunar infarcts are present in the basal ganglia and right thalamus. The brainstem and cerebellum are normal. The internal auditory canals are within normal limits bilaterally. Flow is present in the major intracranial arteries. The globes and orbits are intact. A fluid level is present in the left maxillary sinus. Mild mucosal thickening is present throughout the maxillary sinus. A fluid level is present in the nasopharynx. There is some fluid in the right mastoid air cells. The skullbase is otherwise within normal limits. Midline sagittal images are unremarkable. MRA HEAD FINDINGS Atherosclerotic changes are present within the cavernous internal carotid arteries bilaterally without focal stenosis. The A1 and M1 segments are normal. There is mild narrowing of the distal left M1 segment. Signal loss in the posterior right M2 segment suggests severe stenosis. The superior segment is not well seen. There is significant attenuation of MCA branch vessels bilaterally. The right vertebral artery is slightly dominant to the left. The PICA origin is visualized. Atherosclerotic changes are present throughout the the basilar artery without focal stenosis. Both posterior cerebral arteries originate the basilar tip. Moderate narrowing is present in the distal P2 segments bilaterally. The distal PCA branch vessels are poorly visualized. IMPRESSION: 1. Focal area of remote encephalomalacia involving the right occipital lobe. T2 shine through is evident on the diffusion-weighted images. 2. Indeterminate area of  diffusion abnormality in the right centrum semi ovale. This may also represent T2 shine through or a late subacute infarct. Repeat MRI in 1-2 months could be used for further evaluation if clinically indicated. 3. Moderate to severe stenosis of the posterior inferior right M2 segment. 4. Mild narrowing of the distal left M1 segment. 5. Mild to moderate narrowing of distal P2 segments bilaterally. 6. Diffuse distal small vessel disease evident on the MRA. Electronically Signed   By: Marin Roberts M.D.   On: 01/31/2016 19:13   Mr Brain Wo Contrast  01/31/2016  CLINICAL DATA:  Seizures.  Abnormal CT. EXAM: MRI HEAD WITHOUT CONTRAST MRA HEAD WITHOUT CONTRAST TECHNIQUE: Multiplanar, multiecho pulse sequences of the brain and surrounding structures were obtained without intravenous contrast. Angiographic images of the head were obtained using MRA technique without contrast. COMPARISON:  None. FINDINGS: MRI HEAD FINDINGS Areas of restricted diffusion trace signal abnormality are present in the right centrum semi ovale and right occipital lobe. There is no definite restricted diffusion in the occipital lobe. There  is minimal restricted diffusion in the right centrum semi ovale. Extensive white matter changes are present bilaterally. Focal white matter changes correspond with the areas of diffusion trace signal in the right centrum semi ovale and right occipital lobe. The right occipital area appears represent remote encephalomalacia. Remote lacunar infarcts are present in the basal ganglia and right thalamus. The brainstem and cerebellum are normal. The internal auditory canals are within normal limits bilaterally. Flow is present in the major intracranial arteries. The globes and orbits are intact. A fluid level is present in the left maxillary sinus. Mild mucosal thickening is present throughout the maxillary sinus. A fluid level is present in the nasopharynx. There is some fluid in the right mastoid air cells.  The skullbase is otherwise within normal limits. Midline sagittal images are unremarkable. MRA HEAD FINDINGS Atherosclerotic changes are present within the cavernous internal carotid arteries bilaterally without focal stenosis. The A1 and M1 segments are normal. There is mild narrowing of the distal left M1 segment. Signal loss in the posterior right M2 segment suggests severe stenosis. The superior segment is not well seen. There is significant attenuation of MCA branch vessels bilaterally. The right vertebral artery is slightly dominant to the left. The PICA origin is visualized. Atherosclerotic changes are present throughout the the basilar artery without focal stenosis. Both posterior cerebral arteries originate the basilar tip. Moderate narrowing is present in the distal P2 segments bilaterally. The distal PCA branch vessels are poorly visualized. IMPRESSION: 1. Focal area of remote encephalomalacia involving the right occipital lobe. T2 shine through is evident on the diffusion-weighted images. 2. Indeterminate area of diffusion abnormality in the right centrum semi ovale. This may also represent T2 shine through or a late subacute infarct. Repeat MRI in 1-2 months could be used for further evaluation if clinically indicated. 3. Moderate to severe stenosis of the posterior inferior right M2 segment. 4. Mild narrowing of the distal left M1 segment. 5. Mild to moderate narrowing of distal P2 segments bilaterally. 6. Diffuse distal small vessel disease evident on the MRA. Electronically Signed   By: Marin Roberts M.D.   On: 01/31/2016 19:13   Dg Chest Port 1 View  02/01/2016  CLINICAL DATA:  Check endotracheal tube placement EXAM: PORTABLE CHEST 1 VIEW COMPARISON:  01/31/2016 FINDINGS: Endotracheal tube appears have been advanced and now lies 2.4 cm above the carina. A nasogastric catheter is seen within the stomach. The proximal side port lies at the gastroesophageal junction. The cardiac shadow is  stable. The lungs are well aerated bilaterally without focal infiltrate. No bony abnormality is seen. IMPRESSION: No acute abnormality noted. Electronically Signed   By: Alcide Clever M.D.   On: 02/01/2016 07:41   STUDIES:  CT head 05/10 > small subacute infarct in right occipital lobe.  No hemorrhage. CXR 05/10 > LUL opacity.  CULTURES: Blood 05/10 > Sputum 05/10 >  ANTIBIOTICS: Unasyn 05/10 >  SIGNIFICANT EVENTS: 05/10 > admitted with status epilepticus, required intubation.  LINES/TUBES: ETT 05/10>>>  DISCUSSION: 65 y.o. M with hx ETOH and cocaine abuse, admitted 05/10 with status epilepticus and possible right occipital CVA.  He required intubation in ED and PCCM was called for admission.  ASSESSMENT / PLAN:  NEUROLOGIC A:   Acute metabolic encephalopathy. Status epilepticus. Subacute CVA. ETOH use. Polysubstance use - UDS in past positive for cocaine and THC.  P:   D/C Fentanyl drip. Versed drip, will titrate down. Daily WUA. Neuro following. Stroke workup per neuro. UDS noted. Thiamine / Folate.  Substance abuse counseling once extubated. Continuous EEG per neuro.  PULMONARY A: VDRF - due to inability to protect airway in the setting of status epilepticus. Concern for aspiration - per EDP, pt had frothy secretions during intubation. Tobacco dependence. Unusually high need for O2 with a clear CXR but chronically on eliquis. P:   Full vent support. Wean as able. Repeat ABG now. VAP prevention measures. Abx / cultures per ID section. Pulmonary hygiene. CXR in AM. Tobacco cessation counseling once extubated.  CARDIOVASCULAR A:  Hypertensive emergency. A.fib with RVR - on eliquis. Hx HTN, CHF (EF 55-60% in Dec 2016). P:  Cardene off. PRN labetalol IV. Increased lopressor to 25 BID. Cards following, no cath. Hold anticoagulation for now given significant thrombocytopenia and CVA. Trend lactate, troponin. Continue outpatient rosuvastatin. Hold  outpatient cardizem, imdur.  RENAL A:   Hypernatremia. AGMA - lactate. AoCKD. P:   Replace electrolytes as indicated. BMP in AM. Free water.  GASTROINTESTINAL A:   Hx HCV. GI prophylaxis. Nutrition. P:   SUP: Pantoprazole. TF per nutrition.  HEMATOLOGIC A:   Thrombocytopenia - presumed due to bone marrow suppression due to ETOH. On chronic anticoagulation due to AFRVR. VTE Prophylaxis. P:  Monitor platelet counts. Hold anticoagulation for now given significant thrombocytopenia and CVA. SCD's. CBC in AM.  INFECTIOUS A:   Concern for aspiration - per EDP, pt had frothy secretions during intubation. P:   Abx as above (unasyn).  Follow cultures as above. PCT noted.  ENDOCRINE A:   Hyperglycemia - no hx DM. P:   SSI. Assess Hgb A1c.  Family updated: No family bedside.  Interdisciplinary Family Meeting v Palliative Care Meeting:  Due by: 02/06/16.  The patient is critically ill with multiple organ systems failure and requires high complexity decision making for assessment and support, frequent evaluation and titration of therapies, application of advanced monitoring technologies and extensive interpretation of multiple databases.   Critical Care Time devoted to patient care services described in this note is  35  Minutes. This time reflects time of care of this signee Dr Koren Bound. This critical care time does not reflect procedure time, or teaching time or supervisory time of PA/NP/Med student/Med Resident etc but could involve care discussion time.  Alyson Reedy, M.D. [facility] Pulmonary/Critical Care Medicine. Pager: [phone]. After hours pager: [phone].

## 2016-02-01 NOTE — Progress Notes (Signed)
ANTICOAGULATION CONSULT NOTE  Pharmacy Consult for Heparin (while apixaban on hold) Indication: atrial fibrillation  No Known Allergies  Patient Measurements: Height: 5\' 11"  (180.3 cm) Weight: 216 lb 14.9 oz (98.4 kg) IBW/kg (Calculated) : Kevin.3 Hepa Vital Signs: Temp: 97.4 F (36.3 C) (05/11 1941) Temp Source: Axillary (05/11 1941) BP: 126/92 mmHg (05/11 1936) Pulse Rate: 94 (05/11 1936)  Labs:  Recent Labs  01/31/16 0300 01/31/16 0323 01/31/16 0741 01/31/16 1416  01/31/16 2327 01/31/16 2328 02/01/16 0324 02/01/16 0326 02/01/16 0758 02/01/16 1928  HGB 17.6* 20.1*  --   --   --   --   --   --  13.3  --   --   HCT 54.9* 59.0*  --   --   --   --   --   --  38.5*  --   --   PLT Kevin*  --   --   --   --   --   --   --  69*  --   --   APTT 26  --   --   --   < > 40*  --   --  74* 92*  --   LABPROT 16.0*  --   --   --   --   --   --   --   --   --   --   INR 1.27  --   --   --   --   --   --   --   --   --   --   HEPARINUNFRC  --   --   --   --   < >  --  0.16* 0.41  --   --  0.51  CREATININE 2.13* 1.80*  --   --   --   --   --   --  1.90*  --   --   TROPONINI  --   --  0.Kevin* 17.54*  --   --   --   --   --   --   --   < > = values in this interval not displayed.  Estimated Creatinine Clearance: 46.9 mL/min (by C-G formula based on Cr of 1.9).  Assessment: 65 y/o Kevin [name] who came in as CODE STROKE, seizing upon arrival, pt is on apixaban PTA for afib but unclear if he was taking it since baseline heparin level was undetectable. Heparin level was therapeutic but aPTT is slightly high after a rate increase. Due to acute stroke will decrease dose slightly and use heparin levels going forward.   Repeat HL continues to rise up to 0.51 on heparin 1350 units/hr. Nurse reports no issues with infusion or bleeding.  Goal of Therapy:  Heparin level 0.3-0.5 units/mL Monitor platelets by anticoagulation protocol: Yes   Plan:  Decrease heparin to 1300 units/hr Daily heparin level and  CBC  Arlean Hopping. Newman Pies, PharmD, BCPS Clinical Pharmacist Pager [phone] 02/01/2016 8:28 PM

## 2016-02-01 NOTE — Progress Notes (Signed)
  Echocardiogram 2D Echocardiogram has been performed.  Dorothey Baseman 02/01/2016, 10:26 AM

## 2016-02-01 NOTE — Progress Notes (Signed)
ANTICOAGULATION CONSULT NOTE - Follow Up Consult  Pharmacy Consult for heparin Indication: atrial fibrillation  Labs:  Recent Labs  01/31/16 0300 01/31/16 0323 01/31/16 0741 01/31/16 1416 01/31/16 1913 01/31/16 1923 01/31/16 2327 01/31/16 2328  HGB 17.6* 20.1*  --   --   --   --   --   --   HCT 54.9* 59.0*  --   --   --   --   --   --   PLT 75*  --   --   --   --   --   --   --   APTT 26  --   --   --  31  --  40*  --   LABPROT 16.0*  --   --   --   --   --   --   --   INR 1.27  --   --   --   --   --   --   --   HEPARINUNFRC  --   --   --   --   --  <0.10*  --  0.16*  CREATININE 2.13* 1.80*  --   --   --   --   --   --   TROPONINI  --   --  0.75* 17.54*  --   --   --   --     Assessment: 65yo male subtherapeutic on heparin with initial dosing while Eliquis on hold.  Goal of Therapy:  aPTT 66-84 seconds   Plan:  Will increase heparin gtt by 2-3 units/kg/hr to 1400 units/hr and check PTT in 8hr.  Vernard Gambles, PharmD, BCPS  02/01/2016,12:30 AM

## 2016-02-01 NOTE — Progress Notes (Signed)
  PHARMACY - PHYSICIAN COMMUNICATION CRITICAL VALUE ALERT - BLOOD CULTURE IDENTIFICATION (BCID)  Results for orders placed or performed during the hospital encounter of 01/31/16  Blood Culture ID Panel (Reflexed) (Collected: 01/31/2016  5:49 AM)  Result Value Ref Range   Enterococcus species NOT DETECTED NOT DETECTED   Vancomycin resistance NOT DETECTED NOT DETECTED   Listeria monocytogenes NOT DETECTED NOT DETECTED   Staphylococcus species DETECTED (A) NOT DETECTED   Staphylococcus aureus NOT DETECTED NOT DETECTED   Methicillin resistance NOT DETECTED NOT DETECTED   Streptococcus species NOT DETECTED NOT DETECTED   Streptococcus agalactiae NOT DETECTED NOT DETECTED   Streptococcus pneumoniae NOT DETECTED NOT DETECTED   Streptococcus pyogenes NOT DETECTED NOT DETECTED   Acinetobacter baumannii NOT DETECTED NOT DETECTED   Enterobacteriaceae species NOT DETECTED NOT DETECTED   Enterobacter cloacae complex NOT DETECTED NOT DETECTED   Escherichia coli NOT DETECTED NOT DETECTED   Klebsiella oxytoca NOT DETECTED NOT DETECTED   Klebsiella pneumoniae NOT DETECTED NOT DETECTED   Proteus species NOT DETECTED NOT DETECTED   Serratia marcescens NOT DETECTED NOT DETECTED   Carbapenem resistance NOT DETECTED NOT DETECTED   Haemophilus influenzae NOT DETECTED NOT DETECTED   Neisseria meningitidis NOT DETECTED NOT DETECTED   Pseudomonas aeruginosa NOT DETECTED NOT DETECTED   Candida albicans NOT DETECTED NOT DETECTED   Candida glabrata NOT DETECTED NOT DETECTED   Candida krusei NOT DETECTED NOT DETECTED   Candida parapsilosis NOT DETECTED NOT DETECTED   Candida tropicalis NOT DETECTED NOT DETECTED    Name of physician (or Provider) Contacted: Dirk Dress, NP with CCM  Changes to prescribed antibiotics required: None  He is currently being covered with Amp/sulbactam for aspiration pneumonia.  Although this result likely represents contamination (only 1 of 4 bottles are positive) the  amp/sulbactam will cover this as well.  No changes needed.  Estella Husk, Pharm.D., BCPS, AAHIVP Clinical Pharmacist Phone: [phone] or [phone] Pager: [phone] 02/01/2016, 12:52 PM

## 2016-02-01 NOTE — Progress Notes (Deleted)
eLink Physician-Brief Progress Note Patient Name: Kevin [name] DOB: 05/27/1951 MRN: [id number]   Date of Service  02/01/2016  HPI/Events of Note  Gram positive cocci in blood culture >> Staph species.  ID of culture pending.   eICU Interventions  Will add vancomycin pending final culture results.      Intervention Category Major Interventions: Other:  Galilea Quito 02/01/2016, 3:21 PM

## 2016-02-01 NOTE — Progress Notes (Signed)
[facility] ADULT ICU REPLACEMENT PROTOCOL FOR AM LAB REPLACEMENT ONLY  The patient does not apply for the [facility] Adult ICU Electrolyte Replacment Protocol based on the criteria listed below:   Is urine output >/= 0.5 ml/kg/hr for the last 6 hours? No. Patient's UOP is unknown ml/kg/hr   Abnormal electrolyte(s): K3.3   If a panic level lab has been reported, has the CCM MD in charge been notified? Yes.  .   Physician:  Holland Commons, MD  Melrose Nakayama 02/01/2016 5:51 AM

## 2016-02-01 NOTE — Progress Notes (Signed)
ANTICOAGULATION CONSULT NOTE  Pharmacy Consult for Heparin (while apixaban on hold) Indication: atrial fibrillation  No Known Allergies  Patient Measurements: Height: 5\' 11"  (180.3 cm) Weight: 216 lb 14.9 oz (98.4 kg) IBW/kg (Calculated) : 75.3 Hepa Vital Signs: Temp: 97.4 F (36.3 C) (05/11 0800) Temp Source: Axillary (05/11 0800) BP: 117/76 mmHg (05/11 0800) Pulse Rate: 51 (05/11 0800)  Labs:  Recent Labs  01/31/16 0300 01/31/16 0323 01/31/16 0741 01/31/16 1416  01/31/16 1923 01/31/16 2327 01/31/16 2328 02/01/16 0324 02/01/16 0326 02/01/16 0758  HGB 17.6* 20.1*  --   --   --   --   --   --   --  13.3  --   HCT 54.9* 59.0*  --   --   --   --   --   --   --  38.5*  --   PLT 75*  --   --   --   --   --   --   --   --  69*  --   APTT 26  --   --   --   < >  --  40*  --   --  74* 92*  LABPROT 16.0*  --   --   --   --   --   --   --   --   --   --   INR 1.27  --   --   --   --   --   --   --   --   --   --   HEPARINUNFRC  --   --   --   --   --  <0.10*  --  0.16* 0.41  --   --   CREATININE 2.13* 1.80*  --   --   --   --   --   --   --  1.90*  --   TROPONINI  --   --  0.75* 17.54*  --   --   --   --   --   --   --   < > = values in this interval not displayed.  Estimated Creatinine Clearance: 46.9 mL/min (by C-G formula based on Cr of 1.9).  Assessment: 65 y/o M who came in as CODE STROKE, seizing upon arrival, pt is on apixaban PTA for afib but unclear if he was taking it since baseline heparin level was undetectable. Heparin level was therapeutic but aPTT is slightly high after a rate increase. Due to acute stroke will decrease dose slightly and use heparin levels going forward.   Goal of Therapy:  Heparin level 0.3-0.5 units/mL Monitor platelets by anticoagulation protocol: Yes   Plan:  - Decrease heparin gtt to 1350 units/hr - Check an 8 hour heparin level - Daily heparin level and CBC  Lysle Pearl, PharmD, BCPS Pager # [phone] 02/01/2016 9:24 AM

## 2016-02-02 ENCOUNTER — Inpatient Hospital Stay (HOSPITAL_COMMUNITY): Payer: Managed Care, Other (non HMO)

## 2016-02-02 DIAGNOSIS — J9601 Acute respiratory failure with hypoxia: Secondary | ICD-10-CM | POA: Diagnosis present

## 2016-02-02 DIAGNOSIS — I482 Chronic atrial fibrillation: Secondary | ICD-10-CM

## 2016-02-02 DIAGNOSIS — G9340 Encephalopathy, unspecified: Secondary | ICD-10-CM | POA: Diagnosis present

## 2016-02-02 LAB — BLOOD GAS, ARTERIAL
Acid-base deficit: 5.1 mmol/L — ABNORMAL HIGH (ref 0.0–2.0)
Bicarbonate: 18.2 mEq/L — ABNORMAL LOW (ref 20.0–24.0)
Drawn by: 31101
FIO2: 0.4
MECHVT: 600 mL
O2 Saturation: 98.9 %
PEEP: 5 cmH2O
Patient temperature: 97.3
RATE: 20 resp/min
TCO2: 19 mmol/L (ref 0–100)
pCO2 arterial: 25.5 mmHg — ABNORMAL LOW (ref 35.0–45.0)
pH, Arterial: 7.463 — ABNORMAL HIGH (ref 7.350–7.450)
pO2, Arterial: 122 mmHg — ABNORMAL HIGH (ref 80.0–100.0)

## 2016-02-02 LAB — HEPARIN LEVEL (UNFRACTIONATED)
Heparin Unfractionated: 0.55 IU/mL (ref 0.30–0.70)
Heparin Unfractionated: 0.38 IU/mL (ref 0.30–0.70)

## 2016-02-02 LAB — GLUCOSE, CAPILLARY
Glucose-Capillary: 102 mg/dL — ABNORMAL HIGH (ref 65–99)
Glucose-Capillary: 106 mg/dL — ABNORMAL HIGH (ref 65–99)
Glucose-Capillary: 107 mg/dL — ABNORMAL HIGH (ref 65–99)
Glucose-Capillary: 123 mg/dL — ABNORMAL HIGH (ref 65–99)
Glucose-Capillary: 126 mg/dL — ABNORMAL HIGH (ref 65–99)
Glucose-Capillary: 92 mg/dL (ref 65–99)

## 2016-02-02 LAB — BASIC METABOLIC PANEL
Anion gap: 12 (ref 5–15)
BUN: 30 mg/dL — ABNORMAL HIGH (ref 6–20)
CO2: 19 mmol/L — ABNORMAL LOW (ref 22–32)
Calcium: 8.3 mg/dL — ABNORMAL LOW (ref 8.9–10.3)
Chloride: 112 mmol/L — ABNORMAL HIGH (ref 101–111)
Creatinine, Ser: 1.72 mg/dL — ABNORMAL HIGH (ref 0.61–1.24)
GFR calc Af Amer: 47 mL/min — ABNORMAL LOW (ref >60)
GFR calc non Af Amer: 40 mL/min — ABNORMAL LOW (ref >60)
Glucose, Bld: 100 mg/dL — ABNORMAL HIGH (ref 65–99)
Potassium: 3.8 mmol/L (ref 3.5–5.1)
Sodium: 143 mmol/L (ref 135–145)

## 2016-02-02 LAB — CBC
HCT: 38.2 % — ABNORMAL LOW (ref 39.0–52.0)
Hemoglobin: 12.5 g/dL — ABNORMAL LOW (ref 13.0–17.0)
MCH: 32.5 pg (ref 26.0–34.0)
MCHC: 32.7 g/dL (ref 30.0–36.0)
MCV: 99.2 fL (ref 78.0–100.0)
Platelets: 58 10*3/uL — ABNORMAL LOW (ref 150–400)
RBC: 3.85 MIL/uL — ABNORMAL LOW (ref 4.22–5.81)
RDW: 12.9 % (ref 11.5–15.5)
WBC: 8.9 10*3/uL (ref 4.0–10.5)

## 2016-02-02 LAB — MAGNESIUM: Magnesium: 2.3 mg/dL (ref 1.7–2.4)

## 2016-02-02 LAB — PHOSPHORUS: Phosphorus: 2.7 mg/dL (ref 2.5–4.6)

## 2016-02-02 MED ORDER — BETHANECHOL CHLORIDE 25 MG PO TABS
25.0000 mg | ORAL_TABLET | Freq: Three times a day (TID) | ORAL | Status: DC
  Administered 2016-02-02 – 2016-02-03 (×6): 25 mg via ORAL
  Filled 2016-02-02 (×7): qty 1

## 2016-02-02 MED ORDER — FUROSEMIDE 10 MG/ML IJ SOLN
40.0000 mg | Freq: Three times a day (TID) | INTRAMUSCULAR | Status: AC
  Administered 2016-02-02 (×2): 40 mg via INTRAVENOUS
  Filled 2016-02-02 (×2): qty 4

## 2016-02-02 NOTE — Progress Notes (Signed)
SUBJECTIVE:  Intubated and sedated.  Apparently moving extremities   PHYSICAL EXAM Filed Vitals:   02/02/16 0419 02/02/16 0430 02/02/16 0441 02/02/16 0721  BP: 129/106 123/108 128/99   Pulse: 82 31 106   Temp:      TempSrc:      Resp: 20 20 20    Height:      Weight:      SpO2: 98% 99% 99% 99%   General:  No distress Lungs:  Clear Heart:  RRR Abdomen:  Positive bowel sounds, no rebound no guarding Extremities:  No edema   LABS: Lab Results  Component Value Date   TROPONINI 17.54* 01/31/2016   Results for orders placed or performed during the hospital encounter of 01/31/16 (from the past 24 hour(s))  Glucose, capillary     Status: Abnormal   Collection Time: 02/01/16  8:26 AM  Result Value Ref Range   Glucose-Capillary 119 (H) 65 - 99 mg/dL  Glucose, capillary     Status: Abnormal   Collection Time: 02/01/16 11:11 AM  Result Value Ref Range   Glucose-Capillary 125 (H) 65 - 99 mg/dL  Glucose, capillary     Status: Abnormal   Collection Time: 02/01/16  4:17 PM  Result Value Ref Range   Glucose-Capillary 137 (H) 65 - 99 mg/dL  Heparin level (unfractionated)     Status: None   Collection Time: 02/01/16  7:28 PM  Result Value Ref Range   Heparin Unfractionated 0.51 0.30 - 0.70 IU/mL  Glucose, capillary     Status: Abnormal   Collection Time: 02/01/16  7:37 PM  Result Value Ref Range   Glucose-Capillary 152 (H) 65 - 99 mg/dL  Glucose, capillary     Status: Abnormal   Collection Time: 02/02/16 12:11 AM  Result Value Ref Range   Glucose-Capillary 126 (H) 65 - 99 mg/dL  Blood gas, arterial     Status: Abnormal   Collection Time: 02/02/16  3:00 AM  Result Value Ref Range   FIO2 0.40    Delivery systems VENTILATOR    Mode PRESSURE REGULATED VOLUME CONTROL    VT 600 mL   LHR 20 resp/min   Peep/cpap 5.0 cm H20   pH, Arterial 7.463 (H) 7.350 - 7.450   pCO2 arterial 25.5 (L) 35.0 - 45.0 mmHg   pO2, Arterial 122 (H) 80.0 - 100.0 mmHg   Bicarbonate 18.2 (L) 20.0 -  24.0 mEq/L   TCO2 19.0 0 - 100 mmol/L   Acid-base deficit 5.1 (H) 0.0 - 2.0 mmol/L   O2 Saturation 98.9 %   Patient temperature 97.3    Collection site RIGHT RADIAL    Drawn by [id number]    Sample type ARTERIAL DRAW    Allens test (pass/fail) PASS PASS  Heparin level (unfractionated)     Status: None   Collection Time: 02/02/16  3:32 AM  Result Value Ref Range   Heparin Unfractionated 0.55 0.30 - 0.70 IU/mL  CBC     Status: Abnormal   Collection Time: 02/02/16  3:32 AM  Result Value Ref Range   WBC 8.9 4.0 - 10.5 K/uL   RBC 3.85 (L) 4.22 - 5.81 MIL/uL   Hemoglobin 12.5 (L) 13.0 - 17.0 g/dL   HCT 77.9 (L) 39.6 - 88.6 %   MCV 99.2 78.0 - 100.0 fL   MCH 32.5 26.0 - 34.0 pg   MCHC 32.7 30.0 - 36.0 g/dL   RDW 48.4 72.0 - 72.1 %   Platelets 58 (L) 150 - 400  K/uL  Basic metabolic panel     Status: Abnormal   Collection Time: 02/02/16  3:32 AM  Result Value Ref Range   Sodium 143 135 - 145 mmol/L   Potassium 3.8 3.5 - 5.1 mmol/L   Chloride 112 (H) 101 - 111 mmol/L   CO2 19 (L) 22 - 32 mmol/L   Glucose, Bld 100 (H) 65 - 99 mg/dL   BUN 30 (H) 6 - 20 mg/dL   Creatinine, Ser 1.61 (H) 0.61 - 1.24 mg/dL   Calcium 8.3 (L) 8.9 - 10.3 mg/dL   GFR calc non Af Amer 40 (L) >60 mL/min   GFR calc Af Amer 47 (L) >60 mL/min   Anion gap 12 5 - 15  Magnesium     Status: None   Collection Time: 02/02/16  3:32 AM  Result Value Ref Range   Magnesium 2.3 1.7 - 2.4 mg/dL  Phosphorus     Status: None   Collection Time: 02/02/16  3:32 AM  Result Value Ref Range   Phosphorus 2.7 2.5 - 4.6 mg/dL  Glucose, capillary     Status: None   Collection Time: 02/02/16  3:48 AM  Result Value Ref Range   Glucose-Capillary 92 65 - 99 mg/dL    Intake/Output Summary (Last 24 hours) at 02/02/16 0801 Last data filed at 02/02/16 0400  Gross per 24 hour  Intake 3825.79 ml  Output    775 ml  Net 3050.79 ml   ECHO:  - Left ventricle: The cavity size was normal. Wall thickness was  increased in a pattern of  moderate LVH. Systolic function was  moderately reduced. The estimated ejection fraction was in the  range of 35% to 40%. Diffuse hypokinesis. Regional wall motion  abnormalities cannot be excluded. - Left atrium: The atrium was moderately dilated.  ASSESSMENT AND PLAN:  ELEVATED TROPONIN:   Not a candidate for invasive evaluation.  Continue ASA heparin and beta blocker.  EF is reduced compared with previous but again not an invasive candidate.     NSVT:   No further episodes  ATRIAL FIB:    Rate controlled.    CARDIOMYOPATHY:   CXR clear.  No overt pulm edema.  No change in therapy.  Not on ACE inhibitor with increased creat.    Kevin [name] 02/02/2016 8:01 AM

## 2016-02-02 NOTE — Progress Notes (Signed)
ANTICOAGULATION CONSULT NOTE - Follow Up Consult  Pharmacy Consult for Heparin (apixaban on hold) Indication: atrial fibrillation  No Known Allergies  Patient Measurements: Height: 5\' 11"  (180.3 cm) Weight: 220 lb 14.4 oz (100.2 kg) IBW/kg (Calculated) : 75.3  Vital Signs: Temp: 97.5 F (36.4 C) (05/12 1200) Temp Source: Axillary (05/12 1200) BP: 96/71 mmHg (05/12 1300) Pulse Rate: 93 (05/12 1300)  Labs:  Recent Labs  01/31/16 0300 01/31/16 0323 01/31/16 0741 01/31/16 1416  01/31/16 2327  02/01/16 0326 02/01/16 0758 02/01/16 1928 02/02/16 0332 02/02/16 1232  HGB 17.6* 20.1*  --   --   --   --   --  13.3  --   --  12.5*  --   HCT 54.9* 59.0*  --   --   --   --   --  38.5*  --   --  38.2*  --   PLT 75*  --   --   --   --   --   --  69*  --   --  58*  --   APTT 26  --   --   --   < > 40*  --  74* 92*  --   --   --   LABPROT 16.0*  --   --   --   --   --   --   --   --   --   --   --   INR 1.27  --   --   --   --   --   --   --   --   --   --   --   HEPARINUNFRC  --   --   --   --   < >  --   < >  --   --  0.51 0.55 0.38  CREATININE 2.13* 1.80*  --   --   --   --   --  1.90*  --   --  1.72*  --   TROPONINI  --   --  0.75* 17.54*  --   --   --   --   --   --   --   --   < > = values in this interval not displayed.  Estimated Creatinine Clearance: 52.3 mL/min (by C-G formula based on Cr of 1.72).  Assessment: Heparin while apixaban on hold, heparin level is now therapeutic (goal 0.3-0.5). No bleeding noted.   Goal of Therapy:  Heparin level 0.3-0.5 units/ml Monitor platelets by anticoagulation protocol: Yes   Plan:  - Continue heparin gtt at 1200 units/hr - Daily heparin level and CBC  Doran Nestle, Drake Leach 02/02/2016,2:38 PM

## 2016-02-02 NOTE — Procedures (Signed)
  Electroencephalogram report- LTM   Ordering Physician : DR Amada Jupiter  EEG number: [id number]  Data acquisition: 10-20 electrode placement.  Additional T1, T2, and EKG electrodes; 26 channel digital referential acquisition reformatted to 18 channel/7 channel coronal bipolar   Beginning time: 02/01/16  09 23 12 am Ending time: 02/02/16  08 32 51 am   Day of study: day 1    This 23  hours of intensive EEG monitoring with simultaneous video monitoring was performed for this patient with spells accompanied by altered mental status and generalized tonic-clonic seizure witnessed on arrival.  Noted elevated blood pressure  Medications: Fentanyl folic acid happy brain Keppra medazepam  There was no pushbutton activations events during this recording.  Occasional spontaneous movements were noted.  Background activities were marked by attenuated 0.5-1.5 cps delta slowing distributed broadly.  Superimposed fast frequencies  were distributed broadly with anterior dominant superimposed on continuous delta slowing.  EEG background activities were  reactive to external stimuli and spontaneous movements.   There was no epileptiform discharges clinical or subclinical seizures present.    Clinical interpretation: This 23 hours of intensive EEG monitoring with simultaneous monitoring did not record any clinical or subclinical seizures.  Background activities were abnormal due to reactive background activity slowing predominantly in the delta range suggestive of severe encephalopathy of nonspecific etiologies including toxic metabolic or pharmacologic etiologies.  Sedation status contributed to this findings.  Clinical correlation is advised

## 2016-02-02 NOTE — Progress Notes (Signed)
LTM discontinued; no skin breakdown noted.

## 2016-02-02 NOTE — Progress Notes (Signed)
Subjective: Continues to be encephalopathic.  Concern for some subtle seizures yesterday.   Exam: Filed Vitals:   02/02/16 0930 02/02/16 1000  BP: 124/84 134/95  Pulse: 103 53  Temp:    Resp: 20 20   Gen: In bed, intubated Resp: ventilated Abd: soft, nt  Neuro: MS: does not open eyes. Does not follow commands. Purposeful movements bilaterally RA:JHHID pupil larger than left, eyes more conjugate today.   Motor: withdraws minimally to noxious stimulation x 4.  Sensory:as above.    Impression: 65 yo M presented in status epilepticus, stopped by the time of EEG yesterday. Based on renal function, keppra max dose is 1gm BID.   Recommendations: 1) continue keppra 1gm BID 2) can d/c EEG 3) will follow.   Ritta Slot, MD [facility] [phone]  If 7pm- 7am, please page neurology on call as listed in AMION.

## 2016-02-02 NOTE — Progress Notes (Signed)
PULMONARY / CRITICAL CARE MEDICINE   Name: Kevin [name] MRN: [id number] DOB: [birth year]    ADMISSION DATE:  01/31/2016 CONSULTATION DATE:  01/31/16  REFERRING MD:  EDP  CHIEF COMPLAINT:  Seizures  HISTORY OF PRESENT ILLNESS:  Pt is encephelopathic; therefore, this HPI is obtained from chart review. Kevin [name] is a 64 y.o. male with PMH as outlined below. He was in his USOH up until early AM 01/31/16.  Wife states he went to bed and was last seen normal around 1am.  At 2am, he was vomiting and had eye deviation to the right.  EMS was summoned and pt was brought to [facility] ED for further evaluation.  He was initially brought in as code stroke.  On ED arrival, pt began to have grand mal seizure.  He received 2mg  ativan followed by 2nd dose of 2mg .  Neurology was at bedside and ordered an 1g Keppra load.  Pt was then intubated for airway protection.  Prior to intubation, he had AFRVR with HR as high as 170's.  After intubation and propofol, HR decreased to 150's.  CT of the head revealed equivocal subacute right occipital infarction but was otherwise unremarkable.  PCCM was called for admission.  SUBJECTIVE:  No events overnight, agitated but not following commands this AM.  VITAL SIGNS: BP 215/133 mmHg  Pulse 150  Temp(Src) 98.1 F (36.7 C) (Axillary)  Resp 20  Ht 5\' 11"  (1.803 m)  Wt 100.2 kg (220 lb 14.4 oz)  BMI 30.82 kg/m2  SpO2 97%  HEMODYNAMICS:    VENTILATOR SETTINGS: Vent Mode:  [-] PRVC FiO2 (%):  [40 %] 40 % Set Rate:  [20 bmp] 20 bmp Vt Set:  [600 mL] 600 mL PEEP:  [5 cmH20] 5 cmH20 Plateau Pressure:  [16 cmH20-18 cmH20] 16 cmH20  INTAKE / OUTPUT: I/O last 3 completed shifts: In: 7444.9 [I.V.:4054.9; NG/GT:2560; IV Piggyback:830] Out: 1425 [Urine:1425]  PHYSICAL EXAMINATION: General: Adult male, in NAD. Neuro:  Agitated, moving all ext but not following any commands. HEENT: [place]/AT. PERRL, sclerae anicteric. Cardiovascular: RRR, no M/R/G.  Lungs:  Respirations even and unlabored.  Coarse, L > R. Abdomen: BS x 4, soft, NT/ND.  Musculoskeletal: No gross deformities, no edema.  Skin: Intact, warm, no rashes.  LABS:  BMET  Recent Labs Lab 01/31/16 0300 01/31/16 0323 02/01/16 0326 02/02/16 0332  NA 152* 149* 139 143  K 4.6 4.4 3.3* 3.8  CL 107 113* 108 112*  CO2 13*  --  19* 19*  BUN 20 27* 23* 30*  CREATININE 2.13* 1.80* 1.90* 1.72*  GLUCOSE 186* 165* 149* 100*   Electrolytes  Recent Labs Lab 01/31/16 0300 01/31/16 1415 02/01/16 0326 02/02/16 0332  CALCIUM 10.8*  --  8.0* 8.3*  MG  --  2.4 2.3 2.3  PHOS  --   --  3.4 2.7   CBC  Recent Labs Lab 01/31/16 0300 01/31/16 0323 02/01/16 0326 02/02/16 0332  WBC 12.1*  --  11.7* 8.9  HGB 17.6* 20.1* 13.3 12.5*  HCT 54.9* 59.0* 38.5* 38.2*  PLT 75*  --  69* 58*   Coag's  Recent Labs Lab 01/31/16 0300  01/31/16 2327 02/01/16 0326 02/01/16 0758  APTT 26  < > 40* 74* 92*  INR 1.27  --   --   --   --   < > = values in this interval not displayed. Sepsis Markers  Recent Labs Lab 01/31/16 0549 01/31/16 0742 01/31/16 1138  LATICACIDVEN 7.1* 5.2* 3.7*  ABG  Recent Labs Lab 01/31/16 0905 02/01/16 0406 02/02/16 0300  PHART 7.404 7.399 7.463*  PCO2ART 31.6* 29.8* 25.5*  PO2ART 118.0* 107.0* 122*   Liver Enzymes  Recent Labs Lab 01/31/16 0300  AST 41  ALT 30  ALKPHOS 124  BILITOT 1.3*  ALBUMIN 4.6   Cardiac Enzymes  Recent Labs Lab 01/31/16 0741 01/31/16 1416  TROPONINI 0.75* 17.54*    Glucose  Recent Labs Lab 02/01/16 1111 02/01/16 1617 02/01/16 1937 02/02/16 0011 02/02/16 0348 02/02/16 0818  GLUCAP 125* 137* 152* 126* 92 106*   Imaging Dg Chest Port 1 View  02/02/2016  CLINICAL DATA:  Hypoxia EXAM: PORTABLE CHEST 1 VIEW COMPARISON:  Feb 01, 2016 FINDINGS: Endotracheal tube tip is 2.5 cm above the carina. Nasogastric tube tip and side port are below the diaphragm. No pneumothorax. There is slight bibasilar atelectasis.  Lungs elsewhere clear. Heart slightly enlarged with pulmonary vascularity within normal limits. No adenopathy. IMPRESSION: Tube positions as described without pneumothorax. Mild bibasilar atelectasis. Lungs elsewhere clear. Stable cardiac prominence. Electronically Signed   By: Bretta Bang III M.D.   On: 02/02/2016 08:02   STUDIES:  CT head 05/10 > small subacute infarct in right occipital lobe.  No hemorrhage. CXR 05/10 > LUL opacity.  CULTURES: Blood 05/10 > Sputum 05/10 >  ANTIBIOTICS: Unasyn 05/10 >  SIGNIFICANT EVENTS: 05/10 > admitted with status epilepticus, required intubation.  LINES/TUBES: ETT 05/10>>>  DISCUSSION: 65 y.o. M with hx ETOH and cocaine abuse, admitted 05/10 with status epilepticus and possible right occipital CVA.  He required intubation in ED and PCCM was called for admission.  ASSESSMENT / PLAN:  NEUROLOGIC A:   Acute metabolic encephalopathy. Status epilepticus, no seizure on continuous EEG. Subacute CVA. ETOH use. Polysubstance use - UDS in past positive for cocaine and THC.  P:   Restart fentanyl drip. Versed drip. Daily WUA. Neuro following. D/C EEG. UDS noted. Thiamine / Folate. Substance abuse counseling once extubated. Continue antiepileptics.  PULMONARY A: VDRF - due to inability to protect airway in the setting of status epilepticus. Concern for aspiration - per EDP, pt had frothy secretions during intubation. Tobacco dependence. Unusually high need for O2 with a clear CXR but chronically on eliquis. P:   Begin PS trials. Wean as able. Repeat ABG now. VAP prevention measures. Abx/cultures per ID section. Pulmonary hygiene. CXR in AM. Tobacco cessation counseling once extubated.  CARDIOVASCULAR A:  Hypertensive emergency. A.fib with RVR - on eliquis. Hx HTN, CHF (EF 55-60% in Dec 2016). P:  Cardene off. PRN labetalol IV. Continue lopressor to 25 BID. Cards following, no cath. Hold anticoagulation for now given  significant thrombocytopenia and CVA. Trend lactate, troponin. Continue outpatient rosuvastatin. Hold outpatient cardizem, imdur.  RENAL A:   Hypernatremia. AGMA - lactate. AoCKD. P:   Replace electrolytes as indicated. BMP in AM. Free water. KCO IVF. Lasix 40 mg IV q2 hours x2 doses.  GASTROINTESTINAL A:   Hx HCV. GI prophylaxis. Nutrition. P:   SUP: Pantoprazole. TF per nutrition.  HEMATOLOGIC A:   Thrombocytopenia - presumed due to bone marrow suppression due to ETOH. On chronic anticoagulation due to AFRVR. VTE Prophylaxis. P:  Monitor platelet counts. Hold anticoagulation for now given significant thrombocytopenia and CVA. SCD's. CBC in AM.  INFECTIOUS A:   Concern for aspiration - per EDP, pt had frothy secretions during intubation. P:   Abx as above (unasyn).  Follow cultures as above.  ENDOCRINE A:   Hyperglycemia - no hx DM. P:  SSI. Assess Hgb A1c.  Family updated: No family bedside.  Interdisciplinary Family Meeting v Palliative Care Meeting:  Due by: 02/06/16.  The patient is critically ill with multiple organ systems failure and requires high complexity decision making for assessment and support, frequent evaluation and titration of therapies, application of advanced monitoring technologies and extensive interpretation of multiple databases.   Critical Care Time devoted to patient care services described in this note is  35  Minutes. This time reflects time of care of this signee Dr Koren Bound. This critical care time does not reflect procedure time, or teaching time or supervisory time of PA/NP/Med student/Med Resident etc but could involve care discussion time.  Alyson Reedy, M.D. [facility] Pulmonary/Critical Care Medicine. Pager: [phone]. After hours pager: [phone].

## 2016-02-02 NOTE — Progress Notes (Signed)
ANTICOAGULATION CONSULT NOTE - Follow Up Consult  Pharmacy Consult for Heparin (apixaban on hold) Indication: atrial fibrillation  No Known Allergies  Patient Measurements: Height: 5\' 11"  (180.3 cm) Weight: 220 lb 14.4 oz (100.2 kg) IBW/kg (Calculated) : 75.3  Vital Signs: Temp: 97.8 F (36.6 C) (05/12 0400) Temp Source: Axillary (05/12 0400) BP: 128/99 mmHg (05/12 0441) Pulse Rate: 106 (05/12 0441)  Labs:  Recent Labs  01/31/16 0300 01/31/16 0323 01/31/16 0741 01/31/16 1416  01/31/16 2327  02/01/16 0324 02/01/16 0326 02/01/16 0758 02/01/16 1928 02/02/16 0332  HGB 17.6* 20.1*  --   --   --   --   --   --  13.3  --   --  12.5*  HCT 54.9* 59.0*  --   --   --   --   --   --  38.5*  --   --  38.2*  PLT 75*  --   --   --   --   --   --   --  69*  --   --  58*  APTT 26  --   --   --   < > 40*  --   --  74* 92*  --   --   LABPROT 16.0*  --   --   --   --   --   --   --   --   --   --   --   INR 1.27  --   --   --   --   --   --   --   --   --   --   --   HEPARINUNFRC  --   --   --   --   < >  --   < > 0.41  --   --  0.51 0.55  CREATININE 2.13* 1.80*  --   --   --   --   --   --  1.90*  --   --  1.72*  TROPONINI  --   --  0.75* 17.54*  --   --   --   --   --   --   --   --   < > = values in this interval not displayed.  Estimated Creatinine Clearance: 52.3 mL/min (by C-G formula based on Cr of 1.72).  Assessment: Heparin while apixaban on hold, heparin level slightly above goal this AM (goal 0.3-0.5)  Goal of Therapy:  Heparin level 0.3-0.5 units/ml Monitor platelets by anticoagulation protocol: Yes   Plan:  -Reduce heparin to 1200 units/hr -1300 HL  Kevin [name] 02/02/2016,5:07 AM

## 2016-02-03 ENCOUNTER — Inpatient Hospital Stay (HOSPITAL_COMMUNITY): Payer: Managed Care, Other (non HMO)

## 2016-02-03 LAB — PHOSPHORUS: Phosphorus: 2.8 mg/dL (ref 2.5–4.6)

## 2016-02-03 LAB — TRIGLYCERIDES: Triglycerides: 82 mg/dL (ref <150)

## 2016-02-03 LAB — BLOOD GAS, ARTERIAL
Acid-base deficit: 4 mmol/L — ABNORMAL HIGH (ref 0.0–2.0)
Bicarbonate: 20.3 mEq/L (ref 20.0–24.0)
Drawn by: 40530
FIO2: 0.4
MECHVT: 600 mL
O2 Saturation: 90.8 %
PEEP: 5 cmH2O
Patient temperature: 98.6
RATE: 20 resp/min
TCO2: 21.4 mmol/L (ref 0–100)
pCO2 arterial: 35.1 mmHg (ref 35.0–45.0)
pH, Arterial: 7.379 (ref 7.350–7.450)
pO2, Arterial: 65.8 mmHg — ABNORMAL LOW (ref 80.0–100.0)

## 2016-02-03 LAB — BASIC METABOLIC PANEL
Anion gap: 11 (ref 5–15)
BUN: 27 mg/dL — ABNORMAL HIGH (ref 6–20)
CO2: 22 mmol/L (ref 22–32)
Calcium: 8.1 mg/dL — ABNORMAL LOW (ref 8.9–10.3)
Chloride: 110 mmol/L (ref 101–111)
Creatinine, Ser: 1.66 mg/dL — ABNORMAL HIGH (ref 0.61–1.24)
GFR calc Af Amer: 49 mL/min — ABNORMAL LOW (ref >60)
GFR calc non Af Amer: 42 mL/min — ABNORMAL LOW (ref >60)
Glucose, Bld: 118 mg/dL — ABNORMAL HIGH (ref 65–99)
Potassium: 3.5 mmol/L (ref 3.5–5.1)
Sodium: 143 mmol/L (ref 135–145)

## 2016-02-03 LAB — GLUCOSE, CAPILLARY
Glucose-Capillary: 106 mg/dL — ABNORMAL HIGH (ref 65–99)
Glucose-Capillary: 106 mg/dL — ABNORMAL HIGH (ref 65–99)
Glucose-Capillary: 107 mg/dL — ABNORMAL HIGH (ref 65–99)
Glucose-Capillary: 116 mg/dL — ABNORMAL HIGH (ref 65–99)
Glucose-Capillary: 118 mg/dL — ABNORMAL HIGH (ref 65–99)
Glucose-Capillary: 130 mg/dL — ABNORMAL HIGH (ref 65–99)
Glucose-Capillary: 131 mg/dL — ABNORMAL HIGH (ref 65–99)

## 2016-02-03 LAB — CBC
HCT: 37.3 % — ABNORMAL LOW (ref 39.0–52.0)
Hemoglobin: 12.4 g/dL — ABNORMAL LOW (ref 13.0–17.0)
MCH: 33.7 pg (ref 26.0–34.0)
MCHC: 33.2 g/dL (ref 30.0–36.0)
MCV: 101.4 fL — ABNORMAL HIGH (ref 78.0–100.0)
Platelets: 82 10*3/uL — ABNORMAL LOW (ref 150–400)
RBC: 3.68 MIL/uL — ABNORMAL LOW (ref 4.22–5.81)
RDW: 12.8 % (ref 11.5–15.5)
WBC: 8.7 10*3/uL (ref 4.0–10.5)

## 2016-02-03 LAB — CULTURE, BLOOD (ROUTINE X 2)

## 2016-02-03 LAB — HEPARIN LEVEL (UNFRACTIONATED)
Heparin Unfractionated: 0.21 IU/mL — ABNORMAL LOW (ref 0.30–0.70)
Heparin Unfractionated: 0.83 IU/mL — ABNORMAL HIGH (ref 0.30–0.70)
Heparin Unfractionated: 0.32 IU/mL (ref 0.30–0.70)

## 2016-02-03 LAB — MAGNESIUM: Magnesium: 1.9 mg/dL (ref 1.7–2.4)

## 2016-02-03 MED ORDER — MIDAZOLAM HCL 2 MG/2ML IJ SOLN
1.0000 mg | INTRAMUSCULAR | Status: DC | PRN
  Administered 2016-02-03: 2 mg via INTRAVENOUS
  Administered 2016-02-04 (×2): 4 mg via INTRAVENOUS
  Administered 2016-02-05: 1 mg via INTRAVENOUS
  Filled 2016-02-03 (×2): qty 4
  Filled 2016-02-03 (×2): qty 2

## 2016-02-03 MED ORDER — ANTISEPTIC ORAL RINSE SOLUTION (CORINZ)
7.0000 mL | OROMUCOSAL | Status: DC
  Administered 2016-02-03 – 2016-02-05 (×16): 7 mL via OROMUCOSAL

## 2016-02-03 MED ORDER — CHLORHEXIDINE GLUCONATE 0.12% ORAL RINSE (MEDLINE KIT)
15.0000 mL | Freq: Two times a day (BID) | OROMUCOSAL | Status: DC
  Administered 2016-02-03: 15 mL via OROMUCOSAL

## 2016-02-03 MED ORDER — POTASSIUM CHLORIDE 20 MEQ/15ML (10%) PO SOLN
20.0000 meq | ORAL | Status: AC
  Administered 2016-02-03 (×2): 20 meq
  Filled 2016-02-03 (×2): qty 15

## 2016-02-03 MED ORDER — FENTANYL CITRATE (PF) 100 MCG/2ML IJ SOLN
50.0000 ug | INTRAMUSCULAR | Status: DC | PRN
  Administered 2016-02-03 – 2016-02-04 (×10): 100 ug via INTRAVENOUS
  Filled 2016-02-03: qty 2
  Filled 2016-02-03: qty 4
  Filled 2016-02-03 (×7): qty 2

## 2016-02-03 MED ORDER — MIDAZOLAM HCL 5 MG/ML IJ SOLN: 1.0000 mg | INTRAMUSCULAR | Status: DC | PRN

## 2016-02-03 MED ORDER — DEXMEDETOMIDINE HCL IN NACL 200 MCG/50ML IV SOLN
0.4000 ug/kg/h | INTRAVENOUS | Status: DC
  Administered 2016-02-03: 0.5 ug/kg/h via INTRAVENOUS
  Administered 2016-02-03: 0.4 ug/kg/h via INTRAVENOUS
  Administered 2016-02-04: 0.8 ug/kg/h via INTRAVENOUS
  Filled 2016-02-03 (×4): qty 50

## 2016-02-03 MED ORDER — POTASSIUM CHLORIDE 20 MEQ/15ML (10%) PO SOLN
20.0000 meq | Freq: Once | ORAL | Status: AC
  Administered 2016-02-03: 20 meq
  Filled 2016-02-03: qty 15

## 2016-02-03 MED ORDER — PHENYLEPHRINE HCL 10 MG/ML IJ SOLN
0.0000 ug/min | INTRAVENOUS | Status: DC
  Administered 2016-02-03: 30 ug/min via INTRAVENOUS
  Filled 2016-02-03: qty 1

## 2016-02-03 MED ORDER — SODIUM CHLORIDE 0.9 % IV BOLUS (SEPSIS)
500.0000 mL | Freq: Once | INTRAVENOUS | Status: AC
  Administered 2016-02-03: 500 mL via INTRAVENOUS

## 2016-02-03 MED ORDER — MAGNESIUM SULFATE 2 GM/50ML IV SOLN
2.0000 g | Freq: Once | INTRAVENOUS | Status: AC
  Administered 2016-02-03: 2 g via INTRAVENOUS
  Filled 2016-02-03: qty 50

## 2016-02-03 NOTE — Progress Notes (Signed)
PULMONARY / CRITICAL CARE MEDICINE   Name: Kevin [name] MRN: [id number] DOB: April 08, 1951    ADMISSION DATE:  01/31/2016 CONSULTATION DATE:  01/31/16  REFERRING MD:  EDP  CHIEF COMPLAINT:  Seizures  BRIEF  Kevin [name] is a 65 y.o. male with PMH as outlined below. He was in his USOH up until early AM 01/31/16.  Wife states he went to bed and was last seen normal around 1am.  At 2am, he was vomiting and had eye deviation to the right.  EMS was summoned and pt was brought to [facility] ED for further evaluation.  He was initially brought in as code stroke.  On ED arrival, pt began to have grand mal seizure.  He received 2mg  ativan followed by 2nd dose of 2mg .  Neurology was at bedside and ordered an 1g Keppra load.  Pt was then intubated for airway protection.  Prior to intubation, he had AFRVR with HR as high as 170's.  After intubation and propofol, HR decreased to 150's.  CT of the head revealed equivocal subacute right occipital infarction but was otherwise unremarkable.  PCCM was called for admission.   STUDIES:  CT head 05/10 > small subacute infarct in right occipital lobe.  No hemorrhage. CXR 05/10 > LUL opacity.  CULTURES: Blood 05/10 > Sputum 05/10 >  ANTIBIOTICS: Unasyn 05/10 >  SIGNIFICANT EVENTS: 05/10 > admitted with status epilepticus, required intubation.ETT 05/10>>>   SUBJECTIVE/OVERNIGHT/INTERVAL HX 02/02/16 : no events overnight, agitated but not following commands this AM.   SUBJECTIVE/OVERNIGHT/INTERVAL HX 02/03/16: on versed gtt. Still RASS +2 to -2 and occ + 3 per RN. ON IV heparin gtt since 01/31/16  VITAL SIGNS: BP 112/89 mmHg  Pulse 106  Temp(Src) 96.6 F (35.9 C) (Axillary)  Resp 20  Ht 5\' 11"  (1.803 m)  Wt 101.3 kg (223 lb 5.2 oz)  BMI 31.16 kg/m2  SpO2 98%  HEMODYNAMICS:    VENTILATOR SETTINGS: Vent Mode:  [-] PRVC FiO2 (%):  [40 %] 40 % Set Rate:  [20 bmp] 20 bmp Vt Set:  [600 mL] 600 mL PEEP:  [5 cmH20] 5 cmH20 Plateau Pressure:   [16 cmH20-29 cmH20] 19 cmH20  INTAKE / OUTPUT: I/O last 3 completed shifts: In: 9 [I.V.:3929; NG/GT:2785; IV Piggyback:930] Out: 4065 [Urine:4065]  PHYSICAL EXAMINATION: General: Adult male, in NAD. Neuro:  Agitated, moving all ext but not following any commands. Intermittent agitation HEENT: [place]/AT. PERRL, sclerae anicteric. Cardiovascular: RRR, no M/R/G.  Lungs: Respirations even and unlabored.  Coarse, L > R. Abdomen: BS x 4, soft, NT/ND.  Musculoskeletal: No gross deformities, no edema.  Skin: Intact, warm, no rashes.  LABS: PULMONARY  Recent Labs Lab 01/31/16 0542 01/31/16 0905 02/01/16 0406 02/02/16 0300 02/03/16 0535  PHART 7.303* 7.404 7.399 7.463* 7.379  PCO2ART 37.2 31.6* 29.8* 25.5* 35.1  PO2ART 69.0* 118.0* 107.0* 122* 65.8*  HCO3 18.5* 19.7* 18.5* 18.2* 20.3  TCO2 20 21 19  19.0 21.4  O2SAT 92.0 99.0 98.0 98.9 90.8    CBC  Recent Labs Lab 02/01/16 0326 02/02/16 0332 02/03/16 0418  HGB 13.3 12.5* 12.4*  HCT 38.5* 38.2* 37.3*  WBC 11.7* 8.9 8.7  PLT 69* 58* 82*    COAGULATION  Recent Labs Lab 01/31/16 0300  INR 1.27    CARDIAC   Recent Labs Lab 01/31/16 0741 01/31/16 1416  TROPONINI 0.75* 17.54*   No results for input(s): PROBNP in the last 168 hours.   CHEMISTRY  Recent Labs Lab 01/31/16 0300 01/31/16 0323 01/31/16 1415 02/01/16  4098 02/02/16 0332 02/03/16 0418  NA 152* 149*  --  139 143 143  K 4.6 4.4  --  3.3* 3.8 3.5  CL 107 113*  --  108 112* 110  CO2 13*  --   --  19* 19* 22  GLUCOSE 186* 165*  --  149* 100* 118*  BUN 20 27*  --  23* 30* 27*  CREATININE 2.13* 1.80*  --  1.90* 1.72* 1.66*  CALCIUM 10.8*  --   --  8.0* 8.3* 8.1*  MG  --   --  2.4 2.3 2.3 1.9  PHOS  --   --   --  3.4 2.7 2.8   Estimated Creatinine Clearance: 54.5 mL/min (by C-G formula based on Cr of 1.66).   LIVER  Recent Labs Lab 01/31/16 0300  AST 41  ALT 30  ALKPHOS 124  BILITOT 1.3*  PROT 8.8*  ALBUMIN 4.6  INR 1.27      INFECTIOUS  Recent Labs Lab 01/31/16 0549 01/31/16 0742 01/31/16 1138  LATICACIDVEN 7.1* 5.2* 3.7*     ENDOCRINE CBG (last 3)   Recent Labs  02/02/16 2351 02/03/16 0421 02/03/16 0735  GLUCAP 131* 106* 118*         IMAGING x48h  - image(s) personally visualized  -   highlighted in bold Dg Chest Port 1 View  02/03/2016  CLINICAL DATA:  Respiratory failure EXAM: PORTABLE CHEST 1 VIEW COMPARISON:  02/02/2016 FINDINGS: The endotracheal tube tip is 5.1 cm above the carina. The nasogastric tube extends into the stomach and beyond the inferior edge of the image. No dense consolidation. No large effusion. No pneumothorax. Mild linear basilar opacities persist, likely atelectatic. IMPRESSION: Support equipment appears satisfactorily positioned. No dense consolidation or large effusion. Electronically Signed   By: Ellery Plunk M.D.   On: 02/03/2016 03:38   Dg Chest Port 1 View  02/02/2016  CLINICAL DATA:  Hypoxia EXAM: PORTABLE CHEST 1 VIEW COMPARISON:  Feb 01, 2016 FINDINGS: Endotracheal tube tip is 2.5 cm above the carina. Nasogastric tube tip and side port are below the diaphragm. No pneumothorax. There is slight bibasilar atelectasis. Lungs elsewhere clear. Heart slightly enlarged with pulmonary vascularity within normal limits. No adenopathy. IMPRESSION: Tube positions as described without pneumothorax. Mild bibasilar atelectasis. Lungs elsewhere clear. Stable cardiac prominence. Electronically Signed   By: Bretta Bang III M.D.   On: 02/02/2016 08:02        DISCUSSION: 65 y.o. M with hx ETOH and cocaine abuse, admitted 05/10 with status epilepticus and possible right occipital CVA.  He required intubation in ED and PCCM was called for admission.  ASSESSMENT / PLAN:  NEUROLOGIC A:   Acute metabolic encephalopathy. Status epilepticus, no seizure on continuous EEG. Subacute CVA. ETOH use. Polysubstance use - UDS in past positive for cocaine and THC. \   -  ongoing intermittent agitation  P:   Daily WUA. And extubate when possible Change sedation regimen -   - dc versed gtt  - start precedex gtt + fent prn + versed prn Neuro recs Thiamine / Folate. Substance abuse counseling once extubated. Continue antiepileptics.   PULMONARY A: VDRF - due to inability to protect airway in the setting of status epilepticus. Concern for aspiration - per EDP, pt had frothy secretions during intubation. Tobacco dependence. Unusually high need for O2 with a clear CXR but chronically on eliquis.    - does not meet sbt criteria; agitation is barrier  P:   Full vent support  hopefully we can make improvement with sedation gtt change   CARDIOVASCULAR A:  Hypertensive emergency. A.fib with RVR - on eliquis. Hx HTN, CHF (EF 55-60% in Dec 2016). NSTEMI - latest cards note 02/01/16 - not a interventional candidate   - off cardene as of 02/02/16. On IV heparin gtt since 01/31/16 P:  PRN labetalol IV. Continue lopressor to 25 BID. Cards following, n Recheck  Troponin 02/04/16 Check with cards about dc v contnue  IV heparin gtt. Continue outpatient rosuvastatin. Hold outpatient cardizem, imdur.  RENAL A:   AKI on CKI - improving Mag < 2 K < 4  P:   Replace electrolytes as indicated.; mag and K 02/03/16 Reduce fluid rate BMP in AM. Free water. KCO IVF. Lasix 40 mg IV q2 hours x2 doses.  GASTROINTESTINAL A:   Hx HCV. GI prophylaxis. Nutrition. P:   SUP: Pantoprazole. TF per nutrition.  HEMATOLOGIC A:   Thrombocytopenia - presumed due to bone marrow suppression due to ETOH. On chronic anticoagulation due to AFRVR. VTE Prophylaxis. P:  Monitor platelet counts.. SCD's. CBC in AM.  INFECTIOUS A:   Concern for aspiration - per EDP, pt had frothy secretions during intubation. P:   Anti-infectives    Start     Dose/Rate Route Frequency Ordered Stop   02/01/16 1545  vancomycin (VANCOCIN) 1,500 mg in sodium chloride 0.9 % 500 mL IVPB   Status:  Discontinued     1,500 mg 250 mL/hr over 120 Minutes Intravenous STAT 02/01/16 1535 02/01/16 1547   01/31/16 0500  Ampicillin-Sulbactam (UNASYN) 3 g in sodium chloride 0.9 % 100 mL IVPB     3 g 100 mL/hr over 60 Minutes Intravenous Every 8 hours 01/31/16 0458         ENDOCRINE A:   Hyperglycemia - no hx DM. P:   SSI. Assess Hgb A1c.  Family updated: No family bedside 02/03/2016   Interdisciplinary Family Meeting v Palliative Care Meeting:  Due by: 02/06/16.   GLOBAL chagne sedation gtt to precedex. DC versed gtt  The patient is critically ill with multiple organ systems failure and requires high complexity decision making for assessment and support, frequent evaluation and titration of therapies, application of advanced monitoring technologies and extensive interpretation of multiple databases.   Critical Care Time devoted to patient care services described in this note is  30  Minutes. This time reflects time of care of this signee Dr Kalman Shan. This critical care time does not reflect procedure time, or teaching time or supervisory time of PA/NP/Med student/Med Resident etc but could involve care discussion time    Dr. Kalman Shan, M.D., [facility].C.P Pulmonary and Critical Care Medicine Staff Physician [facility] Pager: [phone], If no answer or between  15:00h - 7:00h: call [phone]  02/03/2016 10:04 AM

## 2016-02-03 NOTE — Progress Notes (Signed)
ANTICOAGULATION CONSULT NOTE Pharmacy Consult for Heparin Indication: atrial fibrillation  No Known Allergies  Patient Measurements: Height: 5\' 11"  (180.3 cm) Weight: 223 lb 5.2 oz (101.3 kg) IBW/kg (Calculated) : 75.3  Vital Signs: Temp: 98.1 F (36.7 C) (05/13 0400) Temp Source: Axillary (05/13 0400) BP: 141/102 mmHg (05/13 0200) Pulse Rate: 82 (05/13 0200)  Labs:  Recent Labs  01/31/16 0741 01/31/16 1416  01/31/16 2327  02/01/16 0326 02/01/16 0758  02/02/16 0332 02/02/16 1232 02/03/16 0418 02/03/16 0421  HGB  --   --   --   --   < > 13.3  --   --  12.5*  --  12.4*  --   HCT  --   --   --   --   --  38.5*  --   --  38.2*  --  37.3*  --   PLT  --   --   --   --   --  69*  --   --  58*  --  82*  --   APTT  --   --   < > 40*  --  74* 92*  --   --   --   --   --   HEPARINUNFRC  --   --   < >  --   < >  --   --   < > 0.55 0.38  --  0.21*  CREATININE  --   --   --   --   --  1.90*  --   --  1.72*  --   --   --   TROPONINI 0.75* 17.54*  --   --   --   --   --   --   --   --   --   --   < > = values in this interval not displayed.  Estimated Creatinine Clearance: 52.6 mL/min (by C-G formula based on Cr of 1.72).  Assessment: 65 y.o. male admitted with seizures/VDRF, h/o Afib and Eliquis on hold, for heparin  Goal of Therapy:  Heparin level 0.3-0.5 units/ml Monitor platelets by anticoagulation protocol: Yes   Plan:  Increase Heparin 1350 units/hr  Check heparin level in 8 hours.   Eddie Candle 02/03/2016,5:02 AM

## 2016-02-03 NOTE — Progress Notes (Signed)
Pt agitated, restless, thrashing around in bed, upon assessment ETT was malpositioned. RT replaced to original placement, called [facility] to get an order for chest xray to verify placement. Pt is only oxygenating at 90-93%.

## 2016-02-03 NOTE — Progress Notes (Signed)
[facility] ADULT ICU REPLACEMENT PROTOCOL FOR AM LAB REPLACEMENT ONLY  The patient does apply for the [facility] Adult ICU Electrolyte Replacment Protocol based on the criteria listed below:   1. Is GFR >/= 40 ml/min? Yes.    Patient's GFR today is 49 2. Is urine output >/= 0.5 ml/kg/hr for the last 6 hours? Yes.   Patient's UOP is 0.75 ml/kg/hr 3. Is BUN < 60 mg/dL? Yes.    Patient's BUN today is 27 4. Abnormal electrolyte(s): K+3.5 5. Ordered repletion with: Protocol 6. If a panic level lab has been reported, has the CCM MD in charge been notified? No..   Physician:  E Deterding  [facility] 02/03/2016 5:32 AM

## 2016-02-03 NOTE — Progress Notes (Addendum)
ANTICOAGULATION CONSULT NOTE - Follow Up Consult  Pharmacy Consult:  Heparin Indication: atrial fibrillation  No Known Allergies  Patient Measurements: Height: 5\' 11"  (180.3 cm) Weight: 223 lb 5.2 oz (101.3 kg) IBW/kg (Calculated) : 75.3  Heparin dosing weight = 96 kg  Vital Signs: Temp: 97.1 F (36.2 C) (05/13 1158) Temp Source: Axillary (05/13 1158) BP: 89/71 mmHg (05/13 1300) Pulse Rate: 82 (05/13 1300)  Labs:  Recent Labs  01/31/16 1416  01/31/16 2327  02/01/16 0326 02/01/16 0758  02/02/16 0332 02/02/16 1232 02/03/16 0418 02/03/16 0421 02/03/16 1208  HGB  --   --   --   < > 13.3  --   --  12.5*  --  12.4*  --   --   HCT  --   --   --   --  38.5*  --   --  38.2*  --  37.3*  --   --   PLT  --   --   --   --  69*  --   --  58*  --  82*  --   --   APTT  --   < > 40*  --  74* 92*  --   --   --   --   --   --   HEPARINUNFRC  --   < >  --   < >  --   --   < > 0.55 0.38  --  0.21* 0.83*  CREATININE  --   --   --   --  1.90*  --   --  1.72*  --  1.66*  --   --   TROPONINI 17.54*  --   --   --   --   --   --   --   --   --   --   --   < > = values in this interval not displayed.  Estimated Creatinine Clearance: 54.5 mL/min (by C-G formula based on Cr of 1.66).    Assessment: 53 YOM with acute CVA to continue on IV heparin for history of Afib while Eliquis is on hold.  Heparin level is supra-therapeutic.  Verified that lab was drawn from the same arm where heparin is infusing.  Noted he has thrombocytopenia and his platelet count is improving.   Pharmacy also managing Unasyn for aspiration PNA.  Patient's renal function is improving slowly.  He remains afebrile and his WBC is WNL.  Unasyn 5/10 >>  5/10 MRSA PCR - negative 5/10 UCx - negative 5/10 BCID - Staph 5/10 BCx x2 - CoNS (1 of 2)   Goal of Therapy:  Heparin level 0.3-0.5 units/ml d/t acute CVA Monitor platelets by anticoagulation protocol: Yes    Plan:  - Continue heparin gtt at 1200 units/hr.  STAT  repeat heparin level - instructed phlebotomy not to draw from the same arm as the heparin infusion. - Daily HL / CBC - Continue Unasyn 3gm IV Q8H - Monitor renal fxn, clinical progress    Jayshawn Colston D. Laney Potash, PharmD, BCPS Pager:  [phone] 02/03/2016, 2:01 PM    ===================   Addendum: - stat repeat HL is therapeutic   Plan: - Continue heparin gtt at 1200 units/hr - F/U AM labs   Heavenly Christine D. Laney Potash, PharmD, BCPS Pager:  [phone] 02/03/2016, 2:42 PM

## 2016-02-03 NOTE — Progress Notes (Signed)
Subjective: No further   Exam: Filed Vitals:   02/03/16 1500 02/03/16 1530  BP: 75/65 110/84  Pulse: 79 36  Temp:    Resp: 20 20   Gen: In bed, intubated Resp: ventilated Abd: soft, nt  Neuro: MS:  Opens eyes .Purposeful movements bilaterally HX:TAVWP pupil larger than left, eyes more conjugate today.  He fixates and tracks across midline in both directions.  Motor: withdraws minimally to noxious stimulation x 4. Purposeful movements.  Sensory:as above.    Impression: 65 yo M presented in status epilepticus. He had a recurrent seizure evening of 5/10 so 24 hour monitoring was performed to ensure no subclincal seizures given a persistent encephalopathy. This was negative. He is improving day by day, but still not following commands.   Recommendations: 1) keppra 1gm BID 2) will follow.   Ritta Slot, MD [facility] [phone]  If 7pm- 7am, please page neurology on call as listed in AMION.

## 2016-02-03 NOTE — Progress Notes (Signed)
Patient Name: Kevin [name]      SUBJECTIVE: Route A. fib history of cocaine use admitted with: Stroke and seizures. UDS notable for THC but not cocaine.  Non-STEMI with troponin greater than 17, not checked 48 hours. Felt not to be an interventional candidate.  Nonsustained ventricular tachycardia treated with beta-blockade  On heparin and beta blockers  and aspirin; not outpatient anticoagulation secondary to noncompliance and thrombocytopenia  Prior catheterization 2016 following positive stress test identified a 90% tubular ramus lesion in the area of demonstrable ischemia;  treated medically.  He is intubated and sedated and unresponsive  Past Medical History  Diagnosis Date  . PAD (peripheral artery disease) ([place])   . Hypertension   . Cocaine abuse   . ETOH abuse   . Tobacco abuse   . Cholelithiases 05/01/2012  . Dysrhythmia     afib  . CHF (congestive heart failure) ([place])   . Blood transfusion without reported diagnosis   . Hepatitis C antibody test positive 04/2012  . Claudication ([place])     bilateral  . Hyperpigmentation   . Thrombocytopenia ([place])   . Persistent atrial fibrillation ([place]) 08/23/2014  . CAD in native artery cath 2016 90% mRamus  02/01/2016  . Atrial fibrillation, permanent ([place]) 02/01/2016    Scheduled Meds:  Scheduled Meds: . ampicillin-sulbactam (UNASYN) IV  3 g Intravenous Q8H  . antiseptic oral rinse  7 mL Mouth Rinse 10 times per day  . aspirin  81 mg Per Tube Daily  . bethanechol  25 mg Oral TID  . chlorhexidine gluconate (SAGE KIT)  15 mL Mouth Rinse BID  . folic acid  1 mg Per Tube Daily  . free water  250 mL Per Tube Q6H  . insulin aspart  0-15 Units Subcutaneous Q4H  . levETIRAcetam  1,000 mg Intravenous Q12H  . metoprolol tartrate  25 mg Oral BID  . pantoprazole (PROTONIX) IV  40 mg Intravenous QHS  . rosuvastatin  20 mg Per Tube q1800  . thiamine  100 mg Per Tube Daily   Continuous Infusions: . sodium chloride 75  mL/hr at 02/03/16 1300  . dexmedetomidine 0.3 mcg/kg/hr (02/03/16 1316)  . feeding supplement (VITAL AF 1.2 CAL) 1,000 mL (02/03/16 1300)  . heparin 1,350 Units/hr (02/03/16 1300)   sodium chloride, acetaminophen, fentaNYL (SUBLIMAZE) injection, labetalol, metoprolol, midazolam    PHYSICAL EXAM Filed Vitals:   02/03/16 1158 02/03/16 1200 02/03/16 1230 02/03/16 1300  BP:  _0  Pulse:  70 84 82  Temp: 97.1 F (36.2 C)     TempSrc: Axillary     Resp:  _1 Height:      Weight:      SpO2:  97% 97% 99%    Well developed and nourished intubated and nonresponsive there is blood in the ET tube  HENT normal Neck supple   Carotids brisk and full without bruits Clear Irregularly irregular rate and rhythm with controlled ventricular response, no murmurs or gallops Abd-soft with active BS without hepatomegaly No Clubbing cyanosis edema Skin-warm and dry A & Oriented  Grossly normal sensory and motor function   TELEMETRY: Reviewed telemetry pt in A. fib with controlled ventricular response    Intake/Output Summary (Last 24 hours) at 02/03/16 1434 Last data filed at 02/03/16 1300  Gross per 24 hour  Intake 5525.84 ml  Output   2880 ml  Net 2645.84 ml    LABS: Basic Metabolic Panel:  Recent Labs  Lab 01/31/16 0300 01/31/16 0323  02/01/16 0326 02/02/16 0332 02/03/16 0418  NA 152* 149*  --  139 143 143  K 4.6 4.4  --  3.3* 3.8 3.5  CL 107 113*  --  108 112* 110  CO2 13*  --   --  19* 19* 22  GLUCOSE 186* 165*  --  149* 100* 118*  BUN 20 27*  --  23* 30* 27*  CREATININE 2.13* 1.80*  --  1.90* 1.72* 1.66*  CALCIUM 10.8*  --   --  8.0* 8.3* 8.1*  MG  --   --   < > 2.3 2.3 1.9  PHOS  --   --   < > 3.4 2.7 2.8  < > = values in this interval not displayed. Cardiac Enzymes: No results for input(s): CKTOTAL, CKMB, CKMBINDEX, TROPONINI in the last 72 hours. CBC:  Recent Labs Lab 01/31/16 0300 01/31/16 0323 02/01/16 0326 02/02/16 0332 02/03/16 0418    WBC 12.1*  --  11.7* 8.9 8.7  NEUTROABS 7.7  --   --   --   --   HGB 17.6* 20.1* 13.3 12.5* 12.4*  HCT 54.9* 59.0* 38.5* 38.2* 37.3*  MCV 103.0*  --  97.5 99.2 101.4*  PLT 75*  --  69* 58* 82*   PROTIME: No results for input(s): LABPROT, INR in the last 72 hours. Liver Function Tests: No results for input(s): AST, ALT, ALKPHOS, BILITOT, PROT, ALBUMIN in the last 72 hours. No results for input(s): LIPASE, AMYLASE in the last 72 hours. BNP: BNP (last 3 results) No results for input(s): BNP in the last 8760 hours.  ProBNP (last 3 results) No results for input(s): PROBNP in the last 8760 hours.  D-Dimer: No results for input(s): DDIMER in the last 72 hours. Hemoglobin A1C: No results for input(s): HGBA1C in the last 72 hours. Fasting Lipid Panel:  Recent Labs  02/03/16 0418  TRIG 82   Thyroid Function Tests: No results for input(s): TSH, T4TOTAL, T3FREE, THYROIDAB in the last 72 hours.  Invalid input(s): FREET3 Anemia Panel: No results for input(s): VITAMINB12, FOLATE, FERRITIN, TIBC, IRON, RETICCTPCT in the last 72 hours.    ASSESSMENT AND PLAN:  Principal Problem:   Status epilepticus ([place]) Active Problems:   PAD (peripheral artery disease), PTA to Lt. SFA in 2009, total occlusion mid Rt SFA with collaterals    HTN (hypertension) -Accelerated with End organ involvement   ETOH abuse   NSVT (nonsustained ventricular tachycardia) ([facility])   Thrombocytopenia on admission 04/30/12, thought to be secondary to Xarelto, Platelets no better 2015   Hepatitis C antibody test positive   H/O medication noncompliance   Seizures ([place])   CAD in native artery cath 2016 90% mRamus    Atrial fibrillation, permanent ([facility])   NSTEMI (non-ST elevated myocardial infarction) ([place])   Acute respiratory failure with hypoxia ([facility])   Acute encephalopathy  With non-STEMI, we will recheck troponin. The event that we do not pursue interventional strategy, clopidogrel is recommended for up to a  year We will continue heparin for 5 days total.  No interval ventricular tachycardia  Signed, Virl Axe MD  02/03/2016

## 2016-02-04 ENCOUNTER — Inpatient Hospital Stay (HOSPITAL_COMMUNITY): Payer: Managed Care, Other (non HMO)

## 2016-02-04 LAB — BASIC METABOLIC PANEL
Anion gap: 11 (ref 5–15)
BUN: 26 mg/dL — ABNORMAL HIGH (ref 6–20)
CO2: 21 mmol/L — ABNORMAL LOW (ref 22–32)
Calcium: 8.6 mg/dL — ABNORMAL LOW (ref 8.9–10.3)
Chloride: 111 mmol/L (ref 101–111)
Creatinine, Ser: 1.58 mg/dL — ABNORMAL HIGH (ref 0.61–1.24)
GFR calc Af Amer: 52 mL/min — ABNORMAL LOW (ref >60)
GFR calc non Af Amer: 45 mL/min — ABNORMAL LOW (ref >60)
Glucose, Bld: 143 mg/dL — ABNORMAL HIGH (ref 65–99)
Potassium: 3.8 mmol/L (ref 3.5–5.1)
Sodium: 143 mmol/L (ref 135–145)

## 2016-02-04 LAB — CBC WITH DIFFERENTIAL/PLATELET
Basophils Absolute: 0 10*3/uL (ref 0.0–0.1)
Basophils Absolute: 0 10*3/uL (ref 0.0–0.1)
Basophils Relative: 0 %
Basophils Relative: 0 %
Eosinophils Absolute: 0 10*3/uL (ref 0.0–0.7)
Eosinophils Absolute: 0.1 10*3/uL (ref 0.0–0.7)
Eosinophils Relative: 1 %
Eosinophils Relative: 1 %
HCT: 22.3 % — ABNORMAL LOW (ref 39.0–52.0)
HCT: 38.9 % — ABNORMAL LOW (ref 39.0–52.0)
Hemoglobin: 6.7 g/dL — CL (ref 13.0–17.0)
Hemoglobin: 12.4 g/dL — ABNORMAL LOW (ref 13.0–17.0)
Lymphocytes Relative: 12 %
Lymphocytes Relative: 7 %
Lymphs Abs: 0.5 10*3/uL — ABNORMAL LOW (ref 0.7–4.0)
Lymphs Abs: 0.5 10*3/uL — ABNORMAL LOW (ref 0.7–4.0)
MCH: 31.3 pg (ref 26.0–34.0)
MCH: 32.8 pg (ref 26.0–34.0)
MCHC: 30 g/dL (ref 30.0–36.0)
MCHC: 31.9 g/dL (ref 30.0–36.0)
MCV: 104.2 fL — ABNORMAL HIGH (ref 78.0–100.0)
MCV: 102.9 fL — ABNORMAL HIGH (ref 78.0–100.0)
Monocytes Absolute: 0.4 10*3/uL (ref 0.1–1.0)
Monocytes Absolute: 0.9 10*3/uL (ref 0.1–1.0)
Monocytes Relative: 10 %
Monocytes Relative: 11 %
Neutro Abs: 3.4 10*3/uL (ref 1.7–7.7)
Neutro Abs: 6.3 10*3/uL (ref 1.7–7.7)
Neutrophils Relative %: 77 %
Neutrophils Relative %: 81 %
Platelets: 47 10*3/uL — ABNORMAL LOW (ref 150–400)
Platelets: 100 10*3/uL — ABNORMAL LOW (ref 150–400)
RBC: 2.14 MIL/uL — ABNORMAL LOW (ref 4.22–5.81)
RBC: 3.78 MIL/uL — ABNORMAL LOW (ref 4.22–5.81)
RDW: 12.9 % (ref 11.5–15.5)
RDW: 12.8 % (ref 11.5–15.5)
Smear Review: DECREASED
WBC: 4.3 10*3/uL (ref 4.0–10.5)
WBC: 7.8 10*3/uL (ref 4.0–10.5)

## 2016-02-04 LAB — MAGNESIUM: Magnesium: 2.3 mg/dL (ref 1.7–2.4)

## 2016-02-04 LAB — GLUCOSE, CAPILLARY
Glucose-Capillary: 129 mg/dL — ABNORMAL HIGH (ref 65–99)
Glucose-Capillary: 136 mg/dL — ABNORMAL HIGH (ref 65–99)
Glucose-Capillary: 138 mg/dL — ABNORMAL HIGH (ref 65–99)
Glucose-Capillary: 138 mg/dL — ABNORMAL HIGH (ref 65–99)
Glucose-Capillary: 142 mg/dL — ABNORMAL HIGH (ref 65–99)
Glucose-Capillary: 151 mg/dL — ABNORMAL HIGH (ref 65–99)

## 2016-02-04 LAB — TROPONIN I: Troponin I: 2.37 ng/mL (ref <0.031)

## 2016-02-04 LAB — POCT I-STAT 3, ART BLOOD GAS (G3+)
Acid-base deficit: 8 mmol/L — ABNORMAL HIGH (ref 0.0–2.0)
Bicarbonate: 16 mEq/L — ABNORMAL LOW (ref 20.0–24.0)
O2 Saturation: 96 %
Patient temperature: 101.3
TCO2: 17 mmol/L (ref 0–100)
pCO2 arterial: 30.6 mmHg — ABNORMAL LOW (ref 35.0–45.0)
pH, Arterial: 7.334 — ABNORMAL LOW (ref 7.350–7.450)
pO2, Arterial: 95 mmHg (ref 80.0–100.0)

## 2016-02-04 LAB — PHOSPHORUS: Phosphorus: 2.7 mg/dL (ref 2.5–4.6)

## 2016-02-04 LAB — HEPARIN LEVEL (UNFRACTIONATED)
Heparin Unfractionated: <0.1 IU/mL — ABNORMAL LOW (ref 0.30–0.70)
Heparin Unfractionated: 0.38 IU/mL (ref 0.30–0.70)
Heparin Unfractionated: 0.36 IU/mL (ref 0.30–0.70)

## 2016-02-04 LAB — BRAIN NATRIURETIC PEPTIDE: B Natriuretic Peptide: 563.8 pg/mL — ABNORMAL HIGH (ref 0.0–100.0)

## 2016-02-04 MED ORDER — POTASSIUM CHLORIDE 20 MEQ/15ML (10%) PO SOLN
40.0000 meq | Freq: Once | ORAL | Status: AC
  Administered 2016-02-04: 40 meq
  Filled 2016-02-04: qty 30

## 2016-02-04 MED ORDER — DEXMEDETOMIDINE HCL IN NACL 400 MCG/100ML IV SOLN
0.4000 ug/kg/h | INTRAVENOUS | Status: DC
  Administered 2016-02-04 (×3): 1.1 ug/kg/h via INTRAVENOUS
  Administered 2016-02-04: 1 ug/kg/h via INTRAVENOUS
  Filled 2016-02-04 (×5): qty 100

## 2016-02-04 MED ORDER — FENTANYL CITRATE (PF) 100 MCG/2ML IJ SOLN: 50.0000 ug | Freq: Once | INTRAMUSCULAR | Status: AC

## 2016-02-04 MED ORDER — SODIUM CHLORIDE 0.9% FLUSH: 10.0000 mL | INTRAVENOUS | Status: DC | PRN

## 2016-02-04 MED ORDER — FUROSEMIDE 10 MG/ML IJ SOLN
80.0000 mg | Freq: Three times a day (TID) | INTRAMUSCULAR | Status: DC
  Administered 2016-02-04 – 2016-02-06 (×6): 80 mg via INTRAVENOUS
  Filled 2016-02-04 (×7): qty 8

## 2016-02-04 MED ORDER — SODIUM CHLORIDE 0.9 % IV SOLN: Freq: Once | INTRAVENOUS | Status: DC

## 2016-02-04 MED ORDER — SODIUM CHLORIDE 0.9% FLUSH
10.0000 mL | Freq: Two times a day (BID) | INTRAVENOUS | Status: DC
  Administered 2016-02-04 – 2016-02-09 (×10): 10 mL

## 2016-02-04 MED ORDER — HYDRALAZINE HCL 20 MG/ML IJ SOLN
10.0000 mg | INTRAMUSCULAR | Status: DC | PRN
  Administered 2016-02-04 – 2016-02-05 (×4): 10 mg via INTRAVENOUS
  Filled 2016-02-04 (×4): qty 1

## 2016-02-04 MED ORDER — FENTANYL CITRATE (PF) 100 MCG/2ML IJ SOLN
50.0000 ug | INTRAMUSCULAR | Status: DC | PRN
  Administered 2016-02-04: 100 ug via INTRAVENOUS
  Administered 2016-02-05: 50 ug via INTRAVENOUS
  Filled 2016-02-04 (×2): qty 2

## 2016-02-04 MED ORDER — SODIUM CHLORIDE 0.9 % IV SOLN
25.0000 ug/h | INTRAVENOUS | Status: DC
  Administered 2016-02-04: 50 ug/h via INTRAVENOUS
  Administered 2016-02-04: 300 ug/h via INTRAVENOUS
  Filled 2016-02-04 (×3): qty 50

## 2016-02-04 MED ORDER — HYDRALAZINE HCL 20 MG/ML IJ SOLN
10.0000 mg | Freq: Once | INTRAMUSCULAR | Status: AC
  Administered 2016-02-04: 10 mg via INTRAVENOUS
  Filled 2016-02-04: qty 1

## 2016-02-04 MED ORDER — FENTANYL BOLUS VIA INFUSION: 50.0000 ug | INTRAVENOUS | Status: DC | PRN

## 2016-02-04 NOTE — Progress Notes (Signed)
RT called to pt's room for increased agitation, increased wob, decreased spo2 (83%). Pt placed on 100% FIO2. Pink Frothy secretions noted in ETT and HME so peep was increased to 10. Spo2 improved to 93%. CCM NP at bedside. RT will continue to closely monitor.

## 2016-02-04 NOTE — Progress Notes (Signed)
PULMONARY / CRITICAL CARE MEDICINE   Name: Kevin [name] MRN: [id number] DOB: September 13, 1951    ADMISSION DATE:  01/31/2016 CONSULTATION DATE:  01/31/16  REFERRING MD:  EDP  CHIEF COMPLAINT:  Seizures  BRIEF  Kevin [name] is a 65 y.o. male with PMH as outlined below. He was in his USOH up until early AM 01/31/16.  Wife states he went to bed and was last seen normal around 1am.  At 2am, he was vomiting and had eye deviation to the right.  EMS was summoned and pt was brought to [facility] ED for further evaluation.  He was initially brought in as code stroke.  On ED arrival, pt began to have grand mal seizure.  He received 2mg  ativan followed by 2nd dose of 2mg .  Neurology was at bedside and ordered an 1g Keppra load.  Pt was then intubated for airway protection.  Prior to intubation, he had AFRVR with HR as high as 170's.  After intubation and propofol, HR decreased to 150's.  CT of the head revealed equivocal subacute right occipital infarction but was otherwise unremarkable.  PCCM was called for admission.   STUDIES:  CT head 05/10 > small subacute infarct in right occipital lobe.  No hemorrhage. CXR 05/10 > LUL opacity.  CULTURES: Blood 05/10 > Sputum 05/10 >  ANTIBIOTICS: Unasyn 05/10 >  SIGNIFICANT EVENTS: 05/10 > admitted with status epilepticus, required intubation.ETT 05/10>>> 01/22/16 - ECHO ef 35%   02/02/16 : no events overnight, agitated but not following commands this AM.  02/03/16: on versed gtt. Still RASS +2 to -2 and occ + 3 per RN. ON IV heparin gtt since 01/31/16. Card recommending IV heparin gtt through 02/05/16 and plavix x 1 year   SUBJECTIVE/OVERNIGHT/INTERVAL HX 02/04/16 - precedex  (but needed neo) helped yesterda but this AM needing fent gtt and increased agitation. Increased bp and off neo. Worsening desats during agitation and on 100% fio2/peep 10. Per neuro last seizure 01/31/16. Currently on keppra without new recs. SBP 209 and got hydraalazine. XCXR  worsening edema - +14L since admit / 35 pound weight gain. EF 35% on echo. Getting n saline at 75cc/h  VITAL SIGNS: BP 141/100 mmHg  Pulse 86  Temp(Src) 97.8 F (36.6 C) (Axillary)  Resp 25  Ht 5\' 11"  (1.803 m)  Wt 105.7 kg (233 lb 0.4 oz)  BMI 32.51 kg/m2  SpO2 100%  HEMODYNAMICS:    VENTILATOR SETTINGS: Vent Mode:  [-] PRVC FiO2 (%):  [40 %-100 %] 100 % Set Rate:  [20 bmp] 20 bmp Vt Set:  [600 mL] 600 mL PEEP:  [5 cmH20-10 cmH20] 10 cmH20 Plateau Pressure:  [19 cmH20-21 cmH20] 21 cmH20  INTAKE / OUTPUT: I/O last 3 completed shifts: In: 9000 [I.V.:4130; NG/GT:3510; IV Piggyback:1360] Out: 3175 [Urine:3175]  PHYSICAL EXAMINATION: General: Adult male, in NAD. Neuro:   RASS -3 after sedation prn and gtt fent and gtt precedex HEENT: [place]/AT. PERRL, sclerae anicteric. Cardiovascular: RRR, no M/R/G.  Lungs: Respirations even and unlabored.  Coarse, L > R. Sync with vent Abdomen: BS x 4, soft, NT/ND.  Musculoskeletal: No gross deformities, no edema.  Skin: Intact, warm, no rashes.  LABS: PULMONARY  Recent Labs Lab 01/31/16 0905 02/01/16 0406 02/02/16 0300 02/03/16 0535 02/04/16 0929  PHART 7.404 7.399 7.463* 7.379 7.334*  PCO2ART 31.6* 29.8* 25.5* 35.1 30.6*  PO2ART 118.0* 107.0* 122* 65.8* 95.0  HCO3 19.7* 18.5* 18.2* 20.3 16.0*  TCO2 21 19 19.0 21.4 17  O2SAT 99.0 98.0 98.9 90.8  96.0    CBC  Recent Labs Lab 02/03/16 0418 02/04/16 0312 02/04/16 0430  HGB 12.4* 6.7* 12.4*  HCT 37.3* 22.3* 38.9*  WBC 8.7 4.3 7.8  PLT 82* 47* 100*    COAGULATION  Recent Labs Lab 01/31/16 0300  INR 1.27    CARDIAC    Recent Labs Lab 01/31/16 0741 01/31/16 1416 02/04/16 0430  TROPONINI 0.75* 17.54* 2.37*   No results for input(s): PROBNP in the last 168 hours.   CHEMISTRY  Recent Labs Lab 01/31/16 0300 01/31/16 0323 01/31/16 1415 02/01/16 0326 02/02/16 0332 02/03/16 0418 02/04/16 0430  NA 152* 149*  --  139 143 143 143  K 4.6 4.4  --  3.3* 3.8  3.5 3.8  CL 107 113*  --  108 112* 110 111  CO2 13*  --   --  19* 19* 22 21*  GLUCOSE 186* 165*  --  149* 100* 118* 143*  BUN 20 27*  --  23* 30* 27* 26*  CREATININE 2.13* 1.80*  --  1.90* 1.72* 1.66* 1.58*  CALCIUM 10.8*  --   --  8.0* 8.3* 8.1* 8.6*  MG  --   --  2.4 2.3 2.3 1.9 2.3  PHOS  --   --   --  3.4 2.7 2.8 2.7   Estimated Creatinine Clearance: 58.5 mL/min (by C-G formula based on Cr of 1.58).   LIVER  Recent Labs Lab 01/31/16 0300  AST 41  ALT 30  ALKPHOS 124  BILITOT 1.3*  PROT 8.8*  ALBUMIN 4.6  INR 1.27     INFECTIOUS  Recent Labs Lab 01/31/16 0549 01/31/16 0742 01/31/16 1138  LATICACIDVEN 7.1* 5.2* 3.7*     ENDOCRINE CBG (last 3)   Recent Labs  02/03/16 2322 02/04/16 0327 02/04/16 0810  GLUCAP 106* 138* 138*         IMAGING x48h  - image(s) personally visualized  -   highlighted in bold Dg Chest Port 1 View  02/03/2016  CLINICAL DATA:  Respiratory failure EXAM: PORTABLE CHEST 1 VIEW COMPARISON:  02/02/2016 FINDINGS: The endotracheal tube tip is 5.1 cm above the carina. The nasogastric tube extends into the stomach and beyond the inferior edge of the image. No dense consolidation. No large effusion. No pneumothorax. Mild linear basilar opacities persist, likely atelectatic. IMPRESSION: Support equipment appears satisfactorily positioned. No dense consolidation or large effusion. Electronically Signed   By: Ellery Plunk M.D.   On: 02/03/2016 03:38        DISCUSSION: 65 y.o. M with hx ETOH and cocaine abuse, admitted 05/10 with status epilepticus and possible right occipital CVA.  He required intubation in ED and PCCM was called for admission.  ASSESSMENT / PLAN:  NEUROLOGIC A:   Acute metabolic encephalopathy. Status epilepticus, no seizure on continuous EEG. Subacute CVA. ETOH use. Polysubstance use - UDS in past positive for cocaine and THC.  This admit positive for Marijuana   - 5/154/17 - after improvement with  precedex x 24h. This AM severe agitation associated with high bp, resp hypoxemic failure likely due to pulmonary edema. Needing more fent prn. Off versed gtt x 24h   P:   Reopeat CT head Change sedation regimen -   - continue precedex gtt since 5/13  - add fent gtt 02/04/16  - continue versed prn Neuro recs Thiamine / Folate. Substance abuse counseling once extubated. Continue antiepileptics.   PULMONARY A: VDRF - due to inability to protect airway in the setting of status epilepticus. Concern  for aspiration - per EDP, pt had frothy secretions during intubation. Tobacco dependence. Unusually high need for O2 with a clear CXR but chronically on eliquis.    - does not meet sbt criteria; agitation is barrier + acute hypoxemic resp failure 02/04/16 likely due to worsening pulmonary acute edema due to acute systolic chf  P:   Full vent support STart lasix 02/04/16   CARDIOVASCULAR A:  Hypertensive emergency. A.fib with RVR - on eliquis. Hx HTN, CHF (EF 55-60% in Dec 2016). NSTEMI - latest cards note 02/01/16 - not a interventional candidate. ECHO 02/01/16 with EF 35%   - off cardene as of 02/02/16. On IV heparin gtt since 01/31/16 (cards wanting it through 02/05/16)  - 02/04/16 - likely acute worsening of acute systolic chf  (13L positive balance)   P:  Reduce saline ffrom 75cc/h to [facility] Start aggressive lasix 02/04/16 PRN labetalol IV. Continue lopressor to 25 BID. Cards following, n IV heparin gtt through 02/05/16 Continue outpatient rosuvastatin. Hold outpatient cardizem, imdur.  RENAL A:   AKI on CKI - improving K < 4   + 13L on 02/04/16  P:   Replace electrolytes as indicated.;  K 02/04/16 Reduce fluid rate to kvo BMP in AM. Free water. KCO IVF. Lasix  aggrssive since 02/04/16  GASTROINTESTINAL A:   Hx HCV. GI prophylaxis. Nutrition. P:   SUP: Pantoprazole. TF per nutrition.  HEMATOLOGIC A:   Thrombocytopenia - presumed due to bone marrow suppression due  to ETOH. On chronic anticoagulation due to AFRVR. VTE Prophylaxis. P:  Monitor platelet counts.. SCD's. CBC in AM.  INFECTIOUS A:   Concern for aspiration - per EDP, pt had frothy secretions during intubation. P:   Anti-infectives    Start     Dose/Rate Route Frequency Ordered Stop   02/01/16 1545  vancomycin (VANCOCIN) 1,500 mg in sodium chloride 0.9 % 500 mL IVPB  Status:  Discontinued     1,500 mg 250 mL/hr over 120 Minutes Intravenous STAT 02/01/16 1535 02/01/16 1547   01/31/16 0500  Ampicillin-Sulbactam (UNASYN) 3 g in sodium chloride 0.9 % 100 mL IVPB     3 g 100 mL/hr over 60 Minutes Intravenous Every 8 hours 01/31/16 0458         ENDOCRINE A:   Hyperglycemia - no hx DM. P:   SSI. Assess Hgb A1c.  Family updated: No family bedside 02/04/2016   Interdisciplinary Family Meeting v Palliative Care Meeting:  Due by: 02/06/16.   GLOBAL Diurse. Control agitation. Get head ct  The patient is critically ill with multiple organ systems failure and requires high complexity decision making for assessment and support, frequent evaluation and titration of therapies, application of advanced monitoring technologies and extensive interpretation of multiple databases.   Critical Care Time devoted to patient care services described in this note is  30  Minutes. This time reflects time of care of this signee Dr Kalman Shan. This critical care time does not reflect procedure time, or teaching time or supervisory time of PA/NP/Med student/Med Resident etc but could involve care discussion time    Dr. Kalman Shan, M.D., [facility].C.P Pulmonary and Critical Care Medicine Staff Physician [facility] [place] Pulmonary and Critical Care Pager: [phone], If no answer or between  15:00h - 7:00h: call [phone]  02/04/2016 10:21 AM

## 2016-02-04 NOTE — Progress Notes (Addendum)
Transported pt from 3M02 to CT1 on ventilator. Pt stable throughout with no complications. RT will continue to monitor.

## 2016-02-04 NOTE — Progress Notes (Addendum)
Pt HR climbed to 190's, fluctuating between 160's to 190's. Pt was clearly agitated and in distress, BP 231/140, sats dropped to high 80's.  CCM notified, 100 mg IV Fentanyl, 4 mg Versed given, O2 increased to 100% and PEEP increased to 10.  BP now 112/79 and HR 104, sats 91%, no longer showing signs of distress.   STAT ABG, head CT and CXR ordered.

## 2016-02-04 NOTE — Progress Notes (Signed)
eLink Physician-Brief Progress Note Patient Name: KAELEN MOAK DOB: 1951-09-11 MRN: [id number]   Date of Service  02/04/2016  HPI/Events of Note  DBP elevated.   eICU Interventions  Add prn hydralazine IV.     Intervention Category Major Interventions: Other:  Johnathan Tortorelli 02/04/2016, 4:25 PM

## 2016-02-04 NOTE — Progress Notes (Signed)
PULMONARY / CRITICAL CARE MEDICINE   Name: Kevin [name] MRN: [id number] DOB: 09/13/1951    ADMISSION DATE:  01/31/2016 CONSULTATION DATE:  01/31/16  REFERRING MD:  EDP  CHIEF COMPLAINT:  Seizures  BRIEF  Kevin [name] is a 65 y.o. male with PMH as outlined below. He was in his USOH up until early AM 01/31/16.  Wife states he went to bed and was last seen normal around 1am.  At 2am, he was vomiting and had eye deviation to the right.  EMS was summoned and pt was brought to [facility] ED for further evaluation.  He was initially brought in as code stroke.  On ED arrival, pt began to have grand mal seizure.  He received  ativan followed by 2nd dose of .  Neurology was at bedside and ordered an 1g Keppra load.  Pt was then intubated for airway protection.  Prior to intubation, he had AFRVR with HR as high as 170's.  After intubation and propofol, HR decreased to 150's.  CT of the head revealed equivocal subacute right occipital infarction but was otherwise unremarkable.  PCCM was called for admission.   STUDIES:  CT head 05/10 > small subacute infarct in right occipital lobe.  No hemorrhage. CXR 05/10 > LUL opacity.  CULTURES: Blood 05/10 >  1/2 coag neg staph Sputum 05/10 >  ANTIBIOTICS: Unasyn 05/10 >  SIGNIFICANT EVENTS: 05/10 > admitted with status epilepticus, required intubation. 5/10>>> troponin 17.54>>heparin gtt   TUBES/LINES:  ETT 05/10>>>   SUBJECTIVE/OVERNIGHT/INTERVAL HX 5/14--called by bedside RN for increasing agitation, hypertension, increased vent needs    VITAL SIGNS: BP 162/133 mmHg  Pulse 59  Temp(Src) 97.8 F (36.6 C) (Axillary)  Resp 27  Ht  (1.803 m)  Wt 105.7 kg (233 lb 0.4 oz)  BMI 32.51 kg/m2  SpO2 94%  HEMODYNAMICS:    VENTILATOR SETTINGS: Vent Mode:  [-] PRVC FiO2 (%):  [40 %-100 %] 100 % Set Rate:  [20 bmp] 20 bmp Vt Set:  [600 mL] 600 mL PEEP:  [5 cmH20-10 cmH20] 10 cmH20 Plateau Pressure:  [19 cmH20-21 cmH20] 21  cmH20  INTAKE / OUTPUT: I/O last 3 completed shifts: In: 9000 [I.V.:4130; NG/GT:3510; IV Piggyback:1360] Out: 3175 [Urine:3175]  PHYSICAL EXAMINATION: General: Adult male, in NAD. Neuro:  Agitated, RASS +3, moving all ext but not following any commands HEENT: Kevin [name]/AT. PERRL, sclerae anicteric. Cardiovascular: RRR, no M/R/G.  Lungs: Respirations labored, tachypneic,  Coarse, L > R. Abdomen: BS x 4, soft, NT/ND.  Musculoskeletal: No gross deformities, no edema.  Skin: Intact, diaphoretic, clammy,    BS: PULMONARY  Recent Labs Lab 01/31/16 0542 01/31/16 0905 02/01/16 0406 02/02/16 0300 02/03/16 0535  PHART 7.303* 7.404 7.399 7.463* 7.379  PCO2ART 37.2 31.6* 29.8* 25.5* 35.1  PO2ART 69.0* 118.0* 107.0* 122* 65.8*  HCO3 18.5* 19.7* 18.5* 18.2* 20.3  TCO2 19.0 21.4  O2SAT 92.0 99.0 98.0 98.9 90.8    CBC  Recent Labs Lab 02/03/16 0418 02/04/16 0312 02/04/16 0430  HGB 12.4* 6.7* 12.4*  HCT 37.3* 22.3* 38.9*  WBC 8.7 4.3 7.8  PLT 82* 47* 100*    COAGULATION  Recent Labs Lab 01/31/16 0300  INR 1.27    CARDIAC    Recent Labs Lab 01/31/16 0741 01/31/16 1416 02/04/16 0430  TROPONINI 0.75* 17.54* 2.37*   No results for input(s): PROBNP in the last 168 hours.   CHEMISTRY  Recent Labs Lab 01/31/16 0300 01/31/16 0323 01/31/16 1415 02/01/16 0326 02/02/16 0332 02/03/16  0418 02/04/16 0430  NA 152* 149*  --  139 143 143 143  K 4.6 4.4  --  3.3* 3.8 3.5 3.8  CL 107 113*  --  108 112* 110 111  CO2 13*  --   --  19* 19* 22 21*  GLUCOSE 186* 165*  --  149* 100* 118* 143*  BUN 20 27*  --  23* 30* 27* 26*  CREATININE 2.13* 1.80*  --  1.90* 1.72* 1.66* 1.58*  CALCIUM 10.8*  --   --  8.0* 8.3* 8.1* 8.6*  MG  --   --  2.4 2.3 2.3 1.9 2.3  PHOS  --   --   --  3.4 2.7 2.8 2.7   Estimated Creatinine Clearance: 58.5 mL/min (by C-G formula based on Cr of 1.58).   LIVER  Recent Labs Lab 01/31/16 0300  AST 41  ALT 30  ALKPHOS 124  BILITOT 1.3*   PROT 8.8*  ALBUMIN 4.6  INR 1.27     INFECTIOUS  Recent Labs Lab 01/31/16 0549 01/31/16 0742 01/31/16 1138  LATICACIDVEN 7.1* 5.2* 3.7*     ENDOCRINE CBG (last 3)   Recent Labs  02/03/16 2322 02/04/16 0327 02/04/16 0810  GLUCAP 106* 138* 138*       IMAGING x48h  - image(s) personally visualized  -   highlighted in bold Dg Chest Port 1 View  02/03/2016  CLINICAL DATA:  Respiratory failure EXAM: PORTABLE CHEST 1 VIEW COMPARISON:  02/02/2016 FINDINGS: The endotracheal tube tip is 5.1 cm above the carina. The nasogastric tube extends into the stomach and beyond the inferior edge of the image. No dense consolidation. No large effusion. No pneumothorax. Mild linear basilar opacities persist, likely atelectatic. IMPRESSION: Support equipment appears satisfactorily positioned. No dense consolidation or large effusion. Electronically Signed   By: Ellery Plunk M.D.   On: 02/03/2016 03:38      DISCUSSION: 64 y.o. M with hx ETOH and cocaine abuse, admitted 05/10 with status epilepticus and possible right occipital CVA.  He required intubation in ED and PCCM was called for admission.  ASSESSMENT / PLAN:  NEUROLOGIC A:   Acute metabolic encephalopathy. Status epilepticus, no seizure on continuous EEG. Subacute CVA. ETOH use. Polysubstance use - UDS in past positive for cocaine and THC.  Ongoing delirium/agitation -- acute worsening 5/14 P:   Add fentanyl gtt  Continue precedex, PRN versed  R/o other causes acute agitation -- see below  Daily WUA  Neuro following  Cont keppra  Thiamine / Folate. Substance abuse counseling once extubated.   PULMONARY A: VDRF - due to inability to protect airway in the setting of status epilepticus. Concern for aspiration  Tobacco dependence. Unusually high need for O2 with a clear CXR but chronically on eliquis. P:   Stat ABG  increase peep 10  Full vent support Stat CXR  Consider diuresis - await CXR  HR/BP control     CARDIOVASCULAR A:  Hypertensive emergency. A.fib with RVR - on eliquis. Hx HTN, CHF (EF 55-60% in Dec 2016). NSTEMI - latest cards note 02/01/16 - not an interventional candidate P:  PRN labetalol IV. ASA Continue lopressor to 25 BID. Cards following Troponin, BNP now  Continue heparin gtt for now  Continue outpatient rosuvastatin. Hold outpatient cardizem, imdur.  RENAL A:   AKI on CKI - improving P:   Replace electrolytes as indicated F/u chem Cont free water  KVO IVF.   GASTROINTESTINAL A:   Hx HCV. GI prophylaxis. Nutrition. P:  SUP: Pantoprazole. TF per nutrition.  HEMATOLOGIC A:   Thrombocytopenia - presumed due to bone marrow suppression due to ETOH. On chronic anticoagulation due to AFRVR. VTE Prophylaxis. P:  Monitor platelet counts.. SCD's. CBC in AM.  INFECTIOUS A:   Concern for aspiration  P:   abx as above   ENDOCRINE A:   Hyperglycemia - no hx DM. P:   SSI.   Family updated: No family bedside 02/04/2016   Interdisciplinary Family Meeting v Palliative Care Meeting:  Due by: 02/06/16.   Dirk Dress, NP 02/04/2016  9:27 AM Pager: [phone] or [phone]

## 2016-02-04 NOTE — Progress Notes (Deleted)
eLink Physician-Brief Progress Note Patient Name: Kevin [name] DOB: Jan 01, 1951 MRN: [id number]   Date of Service  02/04/2016  HPI/Events of Note  Hgb drop from 12.4 to 6.7 on heparin gtt for ACS.  HD stable.  eICU Interventions  Plan: Transfuse 1 unit pRBC Post-transfusion CBC Hold heparin until blood infused and post-transfusion CBC obtained - rounding MD to decide to restart heparin     Intervention Category Intermediate Interventions: Other:  DETERDING,ELIZABETH 02/04/2016, 4:01 AM

## 2016-02-04 NOTE — Progress Notes (Signed)
Interval History:                                                                                                                      Kevin [name] is an 65 y.o. male patient with  seizures, as described in the previous notes. His last known seizure was on 01/31/2016, currently on Keppra 1 g twice a day. No further clinical seizures. His long-term EEG previously was negative for seizures.  His blood pressure is elevated this morning during my evaluation systolic 747. Sedated with fentanyl, and Precedex. Continues to be intubated.  No family at bedside.    Past Medical History: Past Medical History  Diagnosis Date  . PAD (peripheral artery disease) ([place])   . Hypertension   . Cocaine abuse   . ETOH abuse   . Tobacco abuse   . Cholelithiases 05/01/2012  . Dysrhythmia     afib  . CHF (congestive heart failure) ([place])   . Blood transfusion without reported diagnosis   . Hepatitis C antibody test positive 04/2012  . Claudication ([place])     bilateral  . Hyperpigmentation   . Thrombocytopenia ([place])   . Persistent atrial fibrillation ([place]) 08/23/2014  . CAD in native artery cath 2016 90% m[place]  02/01/2016  . Atrial fibrillation, permanent ([place]) 02/01/2016    Past Surgical History  Procedure Laterality Date  . Endovascular stent insertion  right leg  . Tee without cardioversion  05/05/2012    Procedure: TRANSESOPHAGEAL ECHOCARDIOGRAM (TEE);  Surgeon: Sanda Klein, MD;  Location: [place];  Service: Cardiovascular;  Laterality: N/A;  . Cardioversion  05/05/2012    Procedure: CARDIOVERSION;  Surgeon: Sanda Klein, MD;  Location: [facility] ENDOSCOPY;  Service: Cardiovascular;  Laterality: N/A;  . Cardiolite      negative for ischemia  . Carotid artery angioplasty    . Doppler echocardiography    . Cardiovascular stress test    . Left heart catheterization with coronary angiogram N/A 05/01/2012    Procedure: LEFT HEART CATHETERIZATION WITH CORONARY ANGIOGRAM;  Surgeon: Leonie Man, MD;   Location: [facility] CATH LAB;  Service: Cardiovascular;  Laterality: N/A;  . Left heart catheterization with coronary angiogram N/A 09/27/2014    Procedure: LEFT HEART CATHETERIZATION WITH CORONARY ANGIOGRAM;  Surgeon: Pixie Casino, MD;  Location: [facility] CATH LAB;  Service: Cardiovascular;  Laterality: N/A;    Family History: Family History  Problem Relation Age of Onset  . Seizures Daughter   . Cancer Mother     Social History:   reports that he has been smoking Cigarettes.  He has a 10 pack-year smoking history. He has never used smokeless tobacco. He reports that he drinks alcohol. He reports that he does not use illicit drugs.  Allergies:  No Known Allergies   Medications:  Current facility-administered medications:  .  0.9 %  sodium chloride infusion, 250 mL, Intravenous, PRN, Rahul P Desai, PA-C .  0.9 %  sodium chloride infusion, , Intravenous, Continuous, Brand Males, MD, Last Rate: 75 mL/hr at 02/04/16 0442, 1,000 mL at 02/04/16 0442 .  0.9 %  sodium chloride infusion, , Intravenous, Once, Colbert Coyer, MD .  acetaminophen (TYLENOL) tablet 650 mg, 650 mg, Oral, Q6H PRN, Rush Farmer, MD, 650 mg at 02/03/16 1812 .  Ampicillin-Sulbactam (UNASYN) 3 g in sodium chloride 0.9 % 100 mL IVPB, 3 g, Intravenous, Q8H, Erenest Blank, RPH, Last Rate: 100 mL/hr at 02/04/16 0512, 3 g at 02/04/16 0512 .  antiseptic oral rinse solution (CORINZ), 7 mL, Mouth Rinse, 10 times per day, Rush Landmark, MD, 7 mL at 02/04/16 [phone] .  aspirin chewable tablet 81 mg, 81 mg, Per Tube, Daily, Chesley Mires, MD, 81 mg at 02/03/16 0943 .  bethanechol (URECHOLINE) tablet 25 mg, 25 mg, Oral, TID, Rush Farmer, MD, 25 mg at 02/03/16 2202 .  chlorhexidine gluconate (SAGE KIT) (PERIDEX) 0.12 % solution 15 mL, 15 mL, Mouth Rinse, BID, Rush Farmer, MD, 15 mL at 02/03/16 2000 .   dexmedetomidine (PRECEDEX) 400 MCG/100ML (4 mcg/mL) infusion, 0.4-1.2 mcg/kg/hr, Intravenous, Titrated, [address] MD, Last Rate: 27.9 mL/hr at 02/04/16 0808, 1.1 mcg/kg/hr at 02/04/16 0808 .  feeding supplement (VITAL AF 1.2 CAL) liquid 1,000 mL, 1,000 mL, Per Tube, Continuous, Asencion Islam, RD, Last Rate: 75 mL/hr at 02/04/16 0156, 1,000 mL at 02/04/16 0156 .  fentaNYL (SUBLIMAZE) injection 50-100 mcg, 50-100 mcg, Intravenous, Q1H PRN, Brand Males, MD .  folic acid (FOLVITE) tablet 1 mg, 1 mg, Per Tube, Daily, Rush Farmer, MD, 1 mg at 02/03/16 0943 .  free water 250 mL, 250 mL, Per Tube, Q6H, Rush Farmer, MD, 250 mL at 02/04/16 0300 .  heparin ADULT infusion 100 units/mL (25000 units/250 mL), 1,350 Units/hr, Intravenous, Continuous, [address], MD, Last Rate: 13.5 mL/hr at 02/04/16 0444, 1,350 Units/hr at 02/04/16 0444 .  insulin aspart (novoLOG) injection 0-15 Units, 0-15 Units, Subcutaneous, Q4H, Rahul P Desai, PA-C, 2 Units at 02/04/16 0515 .  labetalol (NORMODYNE,TRANDATE) injection 10-20 mg, 10-20 mg, Intravenous, Q2H PRN, Rush Farmer, MD .  levETIRAcetam (KEPPRA) 1,000 mg in sodium chloride 0.9 % 100 mL IVPB, 1,000 mg, Intravenous, Q12H, Greta Doom, MD, 1,000 mg at 02/04/16 0643 .  metoprolol (LOPRESSOR) injection 2.5-5 mg, 2.5-5 mg, Intravenous, Q3H PRN, Rahul P Desai, PA-C, 5 mg at 02/01/16 1642 .  metoprolol tartrate (LOPRESSOR) tablet 25 mg, 25 mg, Oral, BID, Thayer Headings, MD, 25 mg at 02/03/16 0943 .  midazolam (VERSED) injection 1-4 mg, 1-4 mg, Intravenous, Q2H PRN, Jose Shirl Harris, MD, 4 mg at 02/04/16 0034 .  pantoprazole (PROTONIX) injection 40 mg, 40 mg, Intravenous, QHS, Rahul P Desai, PA-C, 40 mg at 02/03/16 2202 .  phenylephrine (NEO-SYNEPHRINE) 10 mg in dextrose 5 % 250 mL (0.04 mg/mL) infusion, 0-400 mcg/min, Intravenous, Titrated, Brand Males, MD, Stopped at 02/03/16 2200 .  rosuvastatin (CRESTOR) tablet 20 mg, 20  mg, Per Tube, q1800, Rahul P Desai, PA-C, 20 mg at 02/03/16 1805 .  thiamine (VITAMIN B-1) tablet 100 mg, 100 mg, Per Tube, Daily, Rush Farmer, MD, 100 mg at 02/03/16 0943   Neurologic Examination:  Today's Vitals   02/04/16 0645 02/04/16 0700 02/04/16 0708 02/04/16 0800  BP:  158/127 158/127 162/133  Pulse: 59 99 90 59  Temp:    97.8 F (36.6 C)  TempSrc:    Axillary  Resp: 23 32 27 27  Height:      Weight:      SpO2: 95% 87% 89% 93%  PainSc:       Patient intubated, sedated with fentanyl, and Precedex.  Midline gaze, no nystagmus or gaze deviation.  Minimal grimace to stimulation.    Lab Results: Basic Metabolic Panel:  Recent Labs Lab 01/31/16 0300 01/31/16 0323 01/31/16 1415 02/01/16 0326 02/02/16 0332 02/03/16 0418 02/04/16 0430  NA 152* 149*  --  139 143 143 143  K 4.6 4.4  --  3.3* 3.8 3.5 3.8  CL 107 113*  --  108 112* 110 111  CO2 13*  --   --  19* 19* 22 21*  GLUCOSE 186* 165*  --  149* 100* 118* 143*  BUN 20 27*  --  23* 30* 27* 26*  CREATININE 2.13* 1.80*  --  1.90* 1.72* 1.66* 1.58*  CALCIUM 10.8*  --   --  8.0* 8.3* 8.1* 8.6*  MG  --   --  2.4 2.3 2.3 1.9 2.3  PHOS  --   --   --  3.4 2.7 2.8 2.7    Liver Function Tests:  Recent Labs Lab 01/31/16 0300  AST 41  ALT 30  ALKPHOS 124  BILITOT 1.3*  PROT 8.8*  ALBUMIN 4.6   No results for input(s): LIPASE, AMYLASE in the last 168 hours. No results for input(s): AMMONIA in the last 168 hours.  CBC:  Recent Labs Lab 01/31/16 0300  02/01/16 0326 02/02/16 0332 02/03/16 0418 02/04/16 0312 02/04/16 0430  WBC 12.1*  --  11.7* 8.9 8.7 4.3 7.8  NEUTROABS 7.7  --   --   --   --  3.4 6.3  HGB 17.6*  < > 13.3 12.5* 12.4* 6.7* 12.4*  HCT 54.9*  < > 38.5* 38.2* 37.3* 22.3* 38.9*  MCV 103.0*  --  97.5 99.2 101.4* 104.2* 102.9*  PLT 75*  --  69* 58* 82* 47* 100*  < > = values in this interval not  displayed.  Cardiac Enzymes:  Recent Labs Lab 01/31/16 0741 01/31/16 1416 02/04/16 0430  TROPONINI 0.75* 17.54* 2.37*    Lipid Panel:  Recent Labs Lab 01/31/16 0549 02/03/16 0418  TRIG 1372* 82    CBG:  Recent Labs Lab 02/03/16 1551 02/03/16 2020 02/03/16 2322 02/04/16 0327 02/04/16 0810  GLUCAP 130* 116* 106* 138* 138*    Microbiology: Results for orders placed or performed during the hospital encounter of 01/31/16  Culture, blood (routine x 2)     Status: Abnormal   Collection Time: 01/31/16  5:49 AM  Result Value Ref Range Status   Specimen Description BLOOD LEFT ARM  Final   Special Requests BOTTLES DRAWN AEROBIC AND ANAEROBIC 5ML  Final   Culture  Setup Time   Final    GRAM POSITIVE COCCI IN CLUSTERS ANAEROBIC BOTTLE ONLY Organism ID to follow CRITICAL RESULT CALLED TO, READ BACK BY AND VERIFIED WITH: Truett Perna D AT 1243 02/01/16 BY L BENFIELD    Culture (A)  Final    STAPHYLOCOCCUS SPECIES (COAGULASE NEGATIVE) THE SIGNIFICANCE OF ISOLATING THIS ORGANISM FROM A SINGLE SET OF BLOOD CULTURES WHEN MULTIPLE SETS ARE DRAWN IS UNCERTAIN. PLEASE NOTIFY THE MICROBIOLOGY DEPARTMENT WITHIN ONE WEEK  IF SPECIATION AND SENSITIVITIES ARE REQUIRED.    Report Status 02/03/2016 FINAL  Final  Blood Culture ID Panel (Reflexed)     Status: Abnormal   Collection Time: 01/31/16  5:49 AM  Result Value Ref Range Status   Enterococcus species NOT DETECTED NOT DETECTED Final   Vancomycin resistance NOT DETECTED NOT DETECTED Final   Listeria monocytogenes NOT DETECTED NOT DETECTED Final   Staphylococcus species DETECTED (A) NOT DETECTED Final    Comment: CRITICAL RESULT CALLED TO, READ BACK BY AND VERIFIED WITH: Truett Perna D AT 1243 02/01/16 BY L BENFIELD    Staphylococcus aureus NOT DETECTED NOT DETECTED Final   Methicillin resistance NOT DETECTED NOT DETECTED Final   Streptococcus species NOT DETECTED NOT DETECTED Final   Streptococcus agalactiae NOT DETECTED NOT  DETECTED Final   Streptococcus pneumoniae NOT DETECTED NOT DETECTED Final   Streptococcus pyogenes NOT DETECTED NOT DETECTED Final   Acinetobacter baumannii NOT DETECTED NOT DETECTED Final   Enterobacteriaceae species NOT DETECTED NOT DETECTED Final   Enterobacter cloacae complex NOT DETECTED NOT DETECTED Final   Escherichia coli NOT DETECTED NOT DETECTED Final   Klebsiella oxytoca NOT DETECTED NOT DETECTED Final   Klebsiella pneumoniae NOT DETECTED NOT DETECTED Final   Proteus species NOT DETECTED NOT DETECTED Final   Serratia marcescens NOT DETECTED NOT DETECTED Final   Carbapenem resistance NOT DETECTED NOT DETECTED Final   Haemophilus influenzae NOT DETECTED NOT DETECTED Final   Neisseria meningitidis NOT DETECTED NOT DETECTED Final   Pseudomonas aeruginosa NOT DETECTED NOT DETECTED Final   Candida albicans NOT DETECTED NOT DETECTED Final   Candida glabrata NOT DETECTED NOT DETECTED Final   Candida krusei NOT DETECTED NOT DETECTED Final   Candida parapsilosis NOT DETECTED NOT DETECTED Final   Candida tropicalis NOT DETECTED NOT DETECTED Final  Culture, blood (routine x 2)     Status: None (Preliminary result)   Collection Time: 01/31/16  5:54 AM  Result Value Ref Range Status   Specimen Description BLOOD LEFT ARM  Final   Special Requests BOTTLES DRAWN AEROBIC AND ANAEROBIC 5ML  Final   Culture NO GROWTH 3 DAYS  Final   Report Status PENDING  Incomplete  MRSA PCR Screening     Status: None   Collection Time: 01/31/16  6:34 AM  Result Value Ref Range Status   MRSA by PCR NEGATIVE NEGATIVE Final    Comment:        The GeneXpert MRSA Assay (FDA approved for NASAL specimens only), is one component of a comprehensive MRSA colonization surveillance program. It is not intended to diagnose MRSA infection nor to guide or monitor treatment for MRSA infections.   Urine culture     Status: None   Collection Time: 01/31/16  6:51 AM  Result Value Ref Range Status   Specimen  Description URINE, CATHETERIZED  Final   Special Requests Normal  Final   Culture NO GROWTH  Final   Report Status 02/01/2016 FINAL  Final    Imaging: Mr Jodene Nam Head Wo Contrast  01/31/2016  CLINICAL DATA:  Seizures.  Abnormal CT. EXAM: MRI HEAD WITHOUT CONTRAST MRA HEAD WITHOUT CONTRAST TECHNIQUE: Multiplanar, multiecho pulse sequences of the brain and surrounding structures were obtained without intravenous contrast. Angiographic images of the head were obtained using MRA technique without contrast. COMPARISON:  None. FINDINGS: MRI HEAD FINDINGS Areas of restricted diffusion trace signal abnormality are present in the right centrum semi ovale and right occipital lobe. There is no definite restricted diffusion in the  occipital lobe. There is minimal restricted diffusion in the right centrum semi ovale. Extensive white matter changes are present bilaterally. Focal white matter changes correspond with the areas of diffusion trace signal in the right centrum semi ovale and right occipital lobe. The right occipital area appears represent remote encephalomalacia. Remote lacunar infarcts are present in the basal ganglia and right thalamus. The brainstem and cerebellum are normal. The internal auditory canals are within normal limits bilaterally. Flow is present in the major intracranial arteries. The globes and orbits are intact. A fluid level is present in the left maxillary sinus. Mild mucosal thickening is present throughout the maxillary sinus. A fluid level is present in the nasopharynx. There is some fluid in the right mastoid air cells. The skullbase is otherwise within normal limits. Midline sagittal images are unremarkable. MRA HEAD FINDINGS Atherosclerotic changes are present within the cavernous internal carotid arteries bilaterally without focal stenosis. The A1 and M1 segments are normal. There is mild narrowing of the distal left M1 segment. Signal loss in the posterior right M2 segment suggests severe  stenosis. The superior segment is not well seen. There is significant attenuation of MCA branch vessels bilaterally. The right vertebral artery is slightly dominant to the left. The PICA origin is visualized. Atherosclerotic changes are present throughout the the basilar artery without focal stenosis. Both posterior cerebral arteries originate the basilar tip. Moderate narrowing is present in the distal P2 segments bilaterally. The distal PCA branch vessels are poorly visualized. IMPRESSION: 1. Focal area of remote encephalomalacia involving the right occipital lobe. T2 shine through is evident on the diffusion-weighted images. 2. Indeterminate area of diffusion abnormality in the right centrum semi ovale. This may also represent T2 shine through or a late subacute infarct. Repeat MRI in 1-2 months could be used for further evaluation if clinically indicated. 3. Moderate to severe stenosis of the posterior inferior right M2 segment. 4. Mild narrowing of the distal left M1 segment. 5. Mild to moderate narrowing of distal P2 segments bilaterally. 6. Diffuse distal small vessel disease evident on the MRA. Electronically Signed   By: San Morelle M.D.   On: 01/31/2016 19:13   Mr Brain Wo Contrast  01/31/2016  CLINICAL DATA:  Seizures.  Abnormal CT. EXAM: MRI HEAD WITHOUT CONTRAST MRA HEAD WITHOUT CONTRAST TECHNIQUE: Multiplanar, multiecho pulse sequences of the brain and surrounding structures were obtained without intravenous contrast. Angiographic images of the head were obtained using MRA technique without contrast. COMPARISON:  None. FINDINGS: MRI HEAD FINDINGS Areas of restricted diffusion trace signal abnormality are present in the right centrum semi ovale and right occipital lobe. There is no definite restricted diffusion in the occipital lobe. There is minimal restricted diffusion in the right centrum semi ovale. Extensive white matter changes are present bilaterally. Focal white matter changes  correspond with the areas of diffusion trace signal in the right centrum semi ovale and right occipital lobe. The right occipital area appears represent remote encephalomalacia. Remote lacunar infarcts are present in the basal ganglia and right thalamus. The brainstem and cerebellum are normal. The internal auditory canals are within normal limits bilaterally. Flow is present in the major intracranial arteries. The globes and orbits are intact. A fluid level is present in the left maxillary sinus. Mild mucosal thickening is present throughout the maxillary sinus. A fluid level is present in the nasopharynx. There is some fluid in the right mastoid air cells. The skullbase is otherwise within normal limits. Midline sagittal images are unremarkable. MRA HEAD FINDINGS  Atherosclerotic changes are present within the cavernous internal carotid arteries bilaterally without focal stenosis. The A1 and M1 segments are normal. There is mild narrowing of the distal left M1 segment. Signal loss in the posterior right M2 segment suggests severe stenosis. The superior segment is not well seen. There is significant attenuation of MCA branch vessels bilaterally. The right vertebral artery is slightly dominant to the left. The PICA origin is visualized. Atherosclerotic changes are present throughout the the basilar artery without focal stenosis. Both posterior cerebral arteries originate the basilar tip. Moderate narrowing is present in the distal P2 segments bilaterally. The distal PCA branch vessels are poorly visualized. IMPRESSION: 1. Focal area of remote encephalomalacia involving the right occipital lobe. T2 shine through is evident on the diffusion-weighted images. 2. Indeterminate area of diffusion abnormality in the right centrum semi ovale. This may also represent T2 shine through or a late subacute infarct. Repeat MRI in 1-2 months could be used for further evaluation if clinically indicated. 3. Moderate to severe stenosis  of the posterior inferior right M2 segment. 4. Mild narrowing of the distal left M1 segment. 5. Mild to moderate narrowing of distal P2 segments bilaterally. 6. Diffuse distal small vessel disease evident on the MRA. Electronically Signed   By: San Morelle M.D.   On: 01/31/2016 19:13   Dg Chest Port 1 View  02/03/2016  CLINICAL DATA:  Respiratory failure EXAM: PORTABLE CHEST 1 VIEW COMPARISON:  02/02/2016 FINDINGS: The endotracheal tube tip is 5.1 cm above the carina. The nasogastric tube extends into the stomach and beyond the inferior edge of the image. No dense consolidation. No large effusion. No pneumothorax. Mild linear basilar opacities persist, likely atelectatic. IMPRESSION: Support equipment appears satisfactorily positioned. No dense consolidation or large effusion. Electronically Signed   By: Andreas Newport M.D.   On: 02/03/2016 03:38   Dg Chest Port 1 View  02/02/2016  CLINICAL DATA:  Hypoxia EXAM: PORTABLE CHEST 1 VIEW COMPARISON:  Feb 01, 2016 FINDINGS: Endotracheal tube tip is 2.5 cm above the carina. Nasogastric tube tip and side port are below the diaphragm. No pneumothorax. There is slight bibasilar atelectasis. Lungs elsewhere clear. Heart slightly enlarged with pulmonary vascularity within normal limits. No adenopathy. IMPRESSION: Tube positions as described without pneumothorax. Mild bibasilar atelectasis. Lungs elsewhere clear. Stable cardiac prominence. Electronically Signed   By: Lowella Grip III M.D.   On: 02/02/2016 08:02   Dg Chest Port 1 View  02/01/2016  CLINICAL DATA:  Check endotracheal tube placement EXAM: PORTABLE CHEST 1 VIEW COMPARISON:  01/31/2016 FINDINGS: Endotracheal tube appears have been advanced and now lies 2.4 cm above the carina. A nasogastric catheter is seen within the stomach. The proximal side port lies at the gastroesophageal junction. The cardiac shadow is stable. The lungs are well aerated bilaterally without focal infiltrate. No bony  abnormality is seen. IMPRESSION: No acute abnormality noted. Electronically Signed   By: Inez Catalina M.D.   On: 02/01/2016 07:41    Assessment and plan:   Kevin [name] is an 65 y.o. male patient with with seizures, as described in the previous notes. His last known seizure was on 01/31/2016, currently on Keppra 1 gm twice a day. No further clinical seizures. His long-term EEG previously was negative for seizures. His blood pressure is elevated this morning during my evaluation systolic 517. Management per CCM team. Sedated with fentanyl, and Precedex. Continues to be intubated.  No family at bedside.  No new recommendations from neurology standpoint. Continue Keppra  1 gm twice a day. We'll follow-up PRN until extubation. Please call if he has any further breakthrough seizures.

## 2016-02-04 NOTE — Progress Notes (Addendum)
ANTICOAGULATION CONSULT NOTE - Follow Up Consult  Pharmacy Consult:  Heparin Indication: atrial fibrillation  No Known Allergies  Patient Measurements: Height: 5\' 11"  (180.3 cm) Weight: 233 lb 0.4 oz (105.7 kg) IBW/kg (Calculated) : 75.3  Heparin dosing weight = 96 kg  Vital Signs: Temp: 97.8 F (36.6 C) (05/14 0800) Temp Source: Axillary (05/14 0800) BP: 141/100 mmHg (05/14 0945) Pulse Rate: 86 (05/14 0945)  Labs:  Recent Labs  02/02/16 0332  02/03/16 0418  02/03/16 1404 02/04/16 0312 02/04/16 0430 02/04/16 0838  HGB 12.5*  --  12.4*  --   --  6.7* 12.4*  --   HCT 38.2*  --  37.3*  --   --  22.3* 38.9*  --   PLT 58*  --  82*  --   --  47* 100*  --   HEPARINUNFRC 0.55  < >  --   < > 0.32 <0.10*  --  0.38  CREATININE 1.72*  --  1.66*  --   --   --  1.58*  --   TROPONINI  --   --   --   --   --   --  2.37*  --   < > = values in this interval not displayed.  Estimated Creatinine Clearance: 58.5 mL/min (by C-G formula based on Cr of 1.58).    Assessment: 69 YOM with acute CVA to continue on IV heparin for history of Afib while Eliquis is on hold.  Heparin was held this AM due to acute drop in hemoglobin but repeat labs indicated that it was an error and heparin was resumed.  Heparin level is therapeutic; no bleeding reported and patient's platelet count is improving.   Goal of Therapy:  Heparin level 0.3-0.5 units/ml d/t acute CVA Monitor platelets by anticoagulation protocol: Yes    Plan:  - Continue heparin gtt at 1350 units/hr - Daily HL / CBC - Continue Unasyn 3gm IV Q8H - Monitor renal fxn, clinical progress, abx LOT    Aerionna Moravek D. Laney Potash, PharmD, BCPS Pager:  [phone] 02/04/2016, 1:11 PM   ====================  Addendum: - confirmatory heparin level is therapeutic; no bleeding reported, no change    Taro Hidrogo D. Laney Potash, PharmD, BCPS Pager:  [phone] 02/04/2016, 3:59 PM

## 2016-02-04 NOTE — Progress Notes (Signed)
Wasted 45 mL of Versed with Everette Rank, RN
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
## 2016-02-04 NOTE — Progress Notes (Signed)
ANTICOAGULATION CONSULT NOTE - Follow Up Consult  Pharmacy Consult:  Heparin Indication: atrial fibrillation  No Known Allergies  Patient Measurements: Height: 5\' 11"  (180.3 cm) Weight: 233 lb 0.4 oz (105.7 kg) IBW/kg (Calculated) : 75.3  Heparin dosing weight = 96 kg  Vital Signs: Temp: 98 F (36.7 C) (05/14 0338) Temp Source: Oral (05/14 0338) BP: 158/127 mmHg (05/14 0708) Pulse Rate: 90 (05/14 0708)  Labs:  Recent Labs  02/01/16 0758  02/02/16 0332  02/03/16 0418  02/03/16 1208 02/03/16 1404 02/04/16 0312 02/04/16 0430  HGB  --   < > 12.5*  --  12.4*  --   --   --  6.7* 12.4*  HCT  --   < > 38.2*  --  37.3*  --   --   --  22.3* 38.9*  PLT  --   < > 58*  --  82*  --   --   --  47* 100*  APTT 92*  --   --   --   --   --   --   --   --   --   HEPARINUNFRC  --   < > 0.55  < >  --   < > 0.83* 0.32 <0.10*  --   CREATININE  --   --  1.72*  --  1.66*  --   --   --   --  1.58*  TROPONINI  --   --   --   --   --   --   --   --   --  2.37*  < > = values in this interval not displayed.  Estimated Creatinine Clearance: 58.5 mL/min (by C-G formula based on Cr of 1.58).    Assessment: 38 YOM with acute CVA to continue on IV heparin for history of Afib while Eliquis is on hold.  Heparin level is supra-therapeutic.  Verified that lab was drawn from the same arm where heparin is infusing.  Noted he has thrombocytopenia and his platelet count is improving.  Hgb this AM resulted at 6.7. Was redrawn and now back up to 12.4. Assuming lab error when Hgb resulted as 6.7. Heparin re-started. AM HL undetectable but heparin was held for short period of time. Check HL in ~4 hours to assess truer HL. No bleeding per RN.    Goal of Therapy:  Heparin level 0.3-0.5 units/ml d/t acute CVA Monitor platelets by anticoagulation protocol: Yes    Plan:  - Continue heparin gtt at 1350 units/hr.   - 4 hr HL to assess  - Daily HL / CBC - Continue Unasyn 3gm IV Q8H - Monitor renal fxn, clinical  progress   Neema Fluegge C. Marvis Moeller, PharmD Pharmacy Resident  Pager: [phone] 02/04/2016 7:34 AM

## 2016-02-05 ENCOUNTER — Inpatient Hospital Stay (HOSPITAL_COMMUNITY): Payer: Managed Care, Other (non HMO)

## 2016-02-05 DIAGNOSIS — I998 Other disorder of circulatory system: Secondary | ICD-10-CM

## 2016-02-05 DIAGNOSIS — I2510 Atherosclerotic heart disease of native coronary artery without angina pectoris: Secondary | ICD-10-CM

## 2016-02-05 DIAGNOSIS — R7989 Other specified abnormal findings of blood chemistry: Secondary | ICD-10-CM

## 2016-02-05 LAB — GLUCOSE, CAPILLARY
Glucose-Capillary: 122 mg/dL — ABNORMAL HIGH (ref 65–99)
Glucose-Capillary: 118 mg/dL — ABNORMAL HIGH (ref 65–99)
Glucose-Capillary: 123 mg/dL — ABNORMAL HIGH (ref 65–99)
Glucose-Capillary: 108 mg/dL — ABNORMAL HIGH (ref 65–99)
Glucose-Capillary: 99 mg/dL (ref 65–99)

## 2016-02-05 LAB — BLOOD GAS, ARTERIAL
Acid-base deficit: 1.3 mmol/L (ref 0.0–2.0)
Bicarbonate: 22.6 mEq/L (ref 20.0–24.0)
Delivery systems: POSITIVE
Drawn by: 362771
Expiratory PAP: 5
FIO2: 0.4
Inspiratory PAP: 10
O2 Saturation: 93.9 %
Patient temperature: 98.6
RATE: 8 resp/min
TCO2: 23.6 mmol/L (ref 0–100)
pCO2 arterial: 35.5 mmHg (ref 35.0–45.0)
pH, Arterial: 7.42 (ref 7.350–7.450)
pO2, Arterial: 71.8 mmHg — ABNORMAL LOW (ref 80.0–100.0)

## 2016-02-05 LAB — CBC WITH DIFFERENTIAL/PLATELET
Basophils Absolute: 0 10*3/uL (ref 0.0–0.1)
Basophils Relative: 0 %
Eosinophils Absolute: 0 10*3/uL (ref 0.0–0.7)
Eosinophils Relative: 0 %
HCT: 39.3 % (ref 39.0–52.0)
Hemoglobin: 13.1 g/dL (ref 13.0–17.0)
Lymphocytes Relative: 4 %
Lymphs Abs: 0.6 10*3/uL — ABNORMAL LOW (ref 0.7–4.0)
MCH: 33.9 pg (ref 26.0–34.0)
MCHC: 33.3 g/dL (ref 30.0–36.0)
MCV: 101.8 fL — ABNORMAL HIGH (ref 78.0–100.0)
Monocytes Absolute: 1.3 10*3/uL — ABNORMAL HIGH (ref 0.1–1.0)
Monocytes Relative: 9 %
Neutro Abs: 12.6 10*3/uL — ABNORMAL HIGH (ref 1.7–7.7)
Neutrophils Relative %: 87 %
Platelets: 131 10*3/uL — ABNORMAL LOW (ref 150–400)
RBC: 3.86 MIL/uL — ABNORMAL LOW (ref 4.22–5.81)
RDW: 13 % (ref 11.5–15.5)
WBC: 14.5 10*3/uL — ABNORMAL HIGH (ref 4.0–10.5)

## 2016-02-05 LAB — HEPARIN LEVEL (UNFRACTIONATED): Heparin Unfractionated: 0.36 IU/mL (ref 0.30–0.70)

## 2016-02-05 LAB — BASIC METABOLIC PANEL
Anion gap: 10 (ref 5–15)
BUN: 26 mg/dL — ABNORMAL HIGH (ref 6–20)
CO2: 25 mmol/L (ref 22–32)
Calcium: 8.6 mg/dL — ABNORMAL LOW (ref 8.9–10.3)
Chloride: 109 mmol/L (ref 101–111)
Creatinine, Ser: 1.43 mg/dL — ABNORMAL HIGH (ref 0.61–1.24)
GFR calc Af Amer: 58 mL/min — ABNORMAL LOW (ref >60)
GFR calc non Af Amer: 50 mL/min — ABNORMAL LOW (ref >60)
Glucose, Bld: 148 mg/dL — ABNORMAL HIGH (ref 65–99)
Potassium: 3.7 mmol/L (ref 3.5–5.1)
Sodium: 144 mmol/L (ref 135–145)

## 2016-02-05 LAB — PHOSPHORUS: Phosphorus: 3.6 mg/dL (ref 2.5–4.6)

## 2016-02-05 LAB — TROPONIN I: Troponin I: 1.61 ng/mL (ref <0.031)

## 2016-02-05 LAB — MAGNESIUM: Magnesium: 1.8 mg/dL (ref 1.7–2.4)

## 2016-02-05 MED ORDER — THIAMINE HCL 100 MG/ML IJ SOLN
100.0000 mg | Freq: Every day | INTRAMUSCULAR | Status: DC
  Administered 2016-02-05 – 2016-02-06 (×2): 100 mg via INTRAVENOUS
  Filled 2016-02-05 (×2): qty 2

## 2016-02-05 MED ORDER — DILTIAZEM HCL 100 MG IV SOLR
5.0000 mg/h | INTRAVENOUS | Status: AC
  Administered 2016-02-05: 5 mg/h via INTRAVENOUS
  Administered 2016-02-05: 7.5 mg/h via INTRAVENOUS
  Administered 2016-02-06: 12.5 mg/h via INTRAVENOUS
  Administered 2016-02-06: 15 mg/h via INTRAVENOUS
  Filled 2016-02-05 (×4): qty 100

## 2016-02-05 MED ORDER — ROSUVASTATIN CALCIUM 20 MG PO TABS
20.0000 mg | ORAL_TABLET | Freq: Every day | ORAL | Status: DC
  Administered 2016-02-05 – 2016-02-08 (×4): 20 mg via ORAL
  Filled 2016-02-05 (×4): qty 1

## 2016-02-05 MED ORDER — FUROSEMIDE 10 MG/ML IJ SOLN
80.0000 mg | Freq: Once | INTRAMUSCULAR | Status: AC
  Administered 2016-02-05: 80 mg via INTRAVENOUS
  Filled 2016-02-05: qty 8

## 2016-02-05 MED ORDER — FUROSEMIDE 10 MG/ML IJ SOLN: 80.0000 mg | Freq: Two times a day (BID) | INTRAMUSCULAR | Status: DC

## 2016-02-05 MED ORDER — FOLIC ACID 5 MG/ML IJ SOLN
1.0000 mg | Freq: Every day | INTRAMUSCULAR | Status: DC
  Administered 2016-02-05 – 2016-02-06 (×2): 1 mg via INTRAVENOUS
  Filled 2016-02-05 (×3): qty 0.2

## 2016-02-05 MED ORDER — ASPIRIN 81 MG PO CHEW
81.0000 mg | CHEWABLE_TABLET | Freq: Every day | ORAL | Status: DC
  Administered 2016-02-06 – 2016-02-09 (×4): 81 mg via ORAL
  Filled 2016-02-05 (×4): qty 1

## 2016-02-05 MED ORDER — ANTISEPTIC ORAL RINSE SOLUTION (CORINZ)
7.0000 mL | Freq: Four times a day (QID) | OROMUCOSAL | Status: DC
  Administered 2016-02-05: 7 mL via OROMUCOSAL

## 2016-02-05 NOTE — Evaluation (Signed)
Clinical/Bedside Swallow Evaluation Patient Details  Name: Kevin [name] MRN: [id number] Date of Birth: 06-Jun-1951  Today's Date: 02/05/2016 Time: SLP Start Time (ACUTE ONLY): 1545 SLP Stop Time (ACUTE ONLY): 1600 SLP Time Calculation (min) (ACUTE ONLY): 15 min  Past Medical History:  Past Medical History  Diagnosis Date  . PAD (peripheral artery disease) (HCC)   . Hypertension   . Cocaine abuse   . ETOH abuse   . Tobacco abuse   . Cholelithiases 05/01/2012  . Dysrhythmia     afib  . CHF (congestive heart failure) (HCC)   . Blood transfusion without reported diagnosis   . Hepatitis C antibody test positive 04/2012  . Claudication (HCC)     bilateral  . Hyperpigmentation   . Thrombocytopenia (HCC)   . Persistent atrial fibrillation (HCC) 08/23/2014  . CAD in native artery cath 2016 90% mRamus  02/01/2016  . Atrial fibrillation, permanent (HCC) 02/01/2016   Past Surgical History:  Past Surgical History  Procedure Laterality Date  . Endovascular stent insertion  right leg  . Tee without cardioversion  05/05/2012    Procedure: TRANSESOPHAGEAL ECHOCARDIOGRAM (TEE);  Surgeon: Thurmon Fair, MD;  Location: [facility] ENDOSCOPY;  Service: Cardiovascular;  Laterality: N/A;  . Cardioversion  05/05/2012    Procedure: CARDIOVERSION;  Surgeon: Thurmon Fair, MD;  Location: [facility] ENDOSCOPY;  Service: Cardiovascular;  Laterality: N/A;  . Cardiolite      negative for ischemia  . Carotid artery angioplasty    . Doppler echocardiography    . Cardiovascular stress test    . Left heart catheterization with coronary angiogram N/A 05/01/2012    Procedure: LEFT HEART CATHETERIZATION WITH CORONARY ANGIOGRAM;  Surgeon: Marykay Lex, MD;  Location: [facility] CATH LAB;  Service: Cardiovascular;  Laterality: N/A;  . Left heart catheterization with coronary angiogram N/A 09/27/2014    Procedure: LEFT HEART CATHETERIZATION WITH CORONARY ANGIOGRAM;  Surgeon: Chrystie Nose, MD;  Location: [facility] CATH LAB;  Service:  Cardiovascular;  Laterality: N/A;   HPI:  65 y.o. M with hx ETOH and cocaine abuse, admitted 05/10 with status epilepticus and possible right occipital CVA. Intubated 5/10, self-extubated 5/14.    Assessment / Plan / Recommendation Clinical Impression  Pt presents with functional swallow despite prolonged intubation and self-extubation.  Voice is mildly hoarse; RR compatible with adequate ventilatory/swallow reciprocity.  Pt consumed solid and liquid consistencies with adequate oral control; brisk swallow response; no s/s of aspiration.  Single incident of coughing noted at end of session.  Recommend initiating a regular diet, thin liquids; give meds whole in puree.  If coughing recurs during meal consumption, please contact SLP services.      Aspiration Risk  Mild aspiration risk    Diet Recommendation   regular, thin liquids  Medication Administration: Whole meds with puree    Other  Recommendations Oral Care Recommendations: Oral care BID   Follow up Recommendations  None    Frequency and Duration min 1 x/week  1 week       Prognosis        Swallow Study   General Date of Onset: 01/31/16 HPI: 65 y.o. M with hx ETOH and cocaine abuse, admitted 05/10 with status epilepticus and possible right occipital CVA. Intubated 5/10, self-extubated 5/14.  Type of Study: Bedside Swallow Evaluation Diet Prior to this Study: NPO Temperature Spikes Noted: No Respiratory Status: Room air History of Recent Intubation: Yes Length of Intubations (days): 4 days Date extubated: 02/04/16 Behavior/Cognition: Alert;Cooperative;Distractible Oral Cavity Assessment: Within Functional Limits  Oral Care Completed by SLP: No Oral Cavity - Dentition: Adequate natural dentition Vision: Functional for self-feeding Self-Feeding Abilities: Able to feed self Patient Positioning: Upright in bed Baseline Vocal Quality: Hoarse Volitional Cough: Strong Volitional Swallow: Able to elicit     Oral/Motor/Sensory Function Overall Oral Motor/Sensory Function: Within functional limits   Ice Chips Ice chips: Within functional limits   Thin Liquid Thin Liquid: Within functional limits (excluding cough at end of evaluation) Presentation: Cup    Nectar Thick Nectar Thick Liquid: Not tested   Honey Thick Honey Thick Liquid: Not tested   Puree Puree: Within functional limits   Solid   GO   Solid: Within functional limits       Kevin [name] [place], [place] CCC/SLP Pager [phone]  Kevin [name] 02/05/2016,4:49 PM

## 2016-02-05 NOTE — Progress Notes (Signed)
Interval History:                                                                                                                      Kevin [name] is an 65 y.o. male patient with  seizures, as described in the previous notes. His last known seizure was on 01/31/2016, currently on Keppra 1 g twice a day. No further clinical seizures. His long-term EEG previously was negative for seizures.  Extubated, follows commands.   No family at bedside.    Past Medical History: Past Medical History  Diagnosis Date  . PAD (peripheral artery disease) (HCC)   . Hypertension   . Cocaine abuse   . ETOH abuse   . Tobacco abuse   . Cholelithiases 05/01/2012  . Dysrhythmia     afib  . CHF (congestive heart failure) (HCC)   . Blood transfusion without reported diagnosis   . Hepatitis C antibody test positive 04/2012  . Claudication (HCC)     bilateral  . Hyperpigmentation   . Thrombocytopenia (HCC)   . Persistent atrial fibrillation (HCC) 08/23/2014  . CAD in native artery cath 2016 90% mRamus  02/01/2016  . Atrial fibrillation, permanent (HCC) 02/01/2016    Past Surgical History  Procedure Laterality Date  . Endovascular stent insertion  right leg  . Tee without cardioversion  05/05/2012    Procedure: TRANSESOPHAGEAL ECHOCARDIOGRAM (TEE);  Surgeon: Thurmon Fair, MD;  Location: [facility] ENDOSCOPY;  Service: Cardiovascular;  Laterality: N/A;  . Cardioversion  05/05/2012    Procedure: CARDIOVERSION;  Surgeon: Thurmon Fair, MD;  Location: [facility] ENDOSCOPY;  Service: Cardiovascular;  Laterality: N/A;  . Cardiolite      negative for ischemia  . Carotid artery angioplasty    . Doppler echocardiography    . Cardiovascular stress test    . Left heart catheterization with coronary angiogram N/A 05/01/2012    Procedure: LEFT HEART CATHETERIZATION WITH CORONARY ANGIOGRAM;  Surgeon: Marykay Lex, MD;  Location: [facility] CATH LAB;  Service: Cardiovascular;  Laterality: N/A;  . Left heart catheterization with coronary  angiogram N/A 09/27/2014    Procedure: LEFT HEART CATHETERIZATION WITH CORONARY ANGIOGRAM;  Surgeon: Chrystie Nose, MD;  Location: [facility] CATH LAB;  Service: Cardiovascular;  Laterality: N/A;    Family History: Family History  Problem Relation Age of Onset  . Seizures Daughter   . Cancer Mother     Social History:   reports that he has been smoking Cigarettes.  He has a 10 pack-year smoking history. He has never used smokeless tobacco. He reports that he drinks alcohol. He reports that he does not use illicit drugs.  Allergies:  No Known Allergies   Medications:  Current facility-administered medications:  .  0.9 %  sodium chloride infusion, 250 mL, Intravenous, PRN, Rahul P Desai, PA-C, Last Rate: 10 mL/hr at 02/05/16 1900, 250 mL at 02/05/16 1900 .  Ampicillin-Sulbactam (UNASYN) 3 g in sodium chloride 0.9 % 100 mL IVPB, 3 g, Intravenous, Q8H, Stevphen Rochester, RPH, Last Rate: 100 mL/hr at 02/05/16 1201, 3 g at 02/05/16 1201 .  [START ON 02/06/2016] aspirin chewable tablet 81 mg, 81 mg, Oral, Daily, Rahul P Desai, PA-C .  diltiazem (CARDIZEM) 100 mg in dextrose 5 % 100 mL (1 mg/mL) infusion, 5-15 mg/hr, Intravenous, Titrated, Kalman Shan, MD, Last Rate: 12.5 mL/hr at 02/05/16 2000, 12.5 mg/hr at 02/05/16 2000 .  folic acid injection 1 mg, 1 mg, Intravenous, Daily, Rahul P Desai, PA-C, 1 mg at 02/05/16 1148 .  furosemide (LASIX) injection 80 mg, 80 mg, Intravenous, Q8H, Kalman Shan, MD, 80 mg at 02/05/16 1400 .  heparin ADULT infusion 100 units/mL (25000 units/250 mL), 1,350 Units/hr, Intravenous, Continuous, [address], MD, Last Rate: 13.5 mL/hr at 02/05/16 2000, 1,350 Units/hr at 02/05/16 2000 .  insulin aspart (novoLOG) injection 0-15 Units, 0-15 Units, Subcutaneous, Q4H, Rahul P Desai, PA-C, 2 Units at 02/05/16 1156 .  labetalol (NORMODYNE,TRANDATE)  injection 10-20 mg, 10-20 mg, Intravenous, Q2H PRN, Alyson Reedy, MD, 20 mg at 02/05/16 1852 .  levETIRAcetam (KEPPRA) 1,000 mg in sodium chloride 0.9 % 100 mL IVPB, 1,000 mg, Intravenous, Q12H, Rejeana Brock, MD, 1,000 mg at 02/05/16 1846 .  metoprolol (LOPRESSOR) injection 2.5-5 mg, 2.5-5 mg, Intravenous, Q3H PRN, Rahul P Desai, PA-C, 5 mg at 02/05/16 0951 .  metoprolol tartrate (LOPRESSOR) tablet 25 mg, 25 mg, Oral, BID, Vesta Mixer, MD, 25 mg at 02/05/16 1627 .  pantoprazole (PROTONIX) injection 40 mg, 40 mg, Intravenous, QHS, Rahul P Desai, PA-C, 40 mg at 02/04/16 2114 .  rosuvastatin (CRESTOR) tablet 20 mg, 20 mg, Oral, q1800, Rahul P Desai, PA-C, 20 mg at 02/05/16 1748 .  sodium chloride flush (NS) 0.9 % injection 10-40 mL, 10-40 mL, Intracatheter, Q12H, Jose Angelo A de [place], MD, 10 mL at 02/05/16 0912 .  sodium chloride flush (NS) 0.9 % injection 10-40 mL, 10-40 mL, Intracatheter, PRN, Jose Angelo A de [place], MD .  thiamine (B-1) injection 100 mg, 100 mg, Intravenous, Daily, Rahul P Desai, PA-C, 100 mg at 02/05/16 1148   Neurologic Examination:                                                                                                     Today's Vitals   02/05/16 1800 02/05/16 1900 02/05/16 1945 02/05/16 2000  BP: 161/107 165/106 183/123   Pulse: 100 104 123   Temp:    98.7 F (37.1 C)  TempSrc:    Oral  Resp: 32 21 18   Height:      Weight:      SpO2: 100% 95% 94%   PainSc:       Extubated, non focal exam. Mildly poor attention, perservation.      Lab Results: Basic Metabolic  Panel:  Recent Labs Lab 02/01/16 0326 02/02/16 0332 02/03/16 0418 02/04/16 0430 02/05/16 0400  NA 139 143 143 143 144  K 3.3* 3.8 3.5 3.8 3.7  CL 108 112* 110 111 109  CO2 19* 19* 22 21* 25  GLUCOSE 149* 100* 118* 143* 148*  BUN 23* 30* 27* 26* 26*  CREATININE 1.90* 1.72* 1.66* 1.58* 1.43*  CALCIUM 8.0* 8.3* 8.1* 8.6* 8.6*  MG 2.3 2.3 1.9 2.3 1.8  PHOS 3.4 2.7 2.8 2.7  3.6    Liver Function Tests:  Recent Labs Lab 01/31/16 0300  AST 41  ALT 30  ALKPHOS 124  BILITOT 1.3*  PROT 8.8*  ALBUMIN 4.6   No results for input(s): LIPASE, AMYLASE in the last 168 hours. No results for input(s): AMMONIA in the last 168 hours.  CBC:  Recent Labs Lab 01/31/16 0300  02/02/16 0332 02/03/16 0418 02/04/16 0312 02/04/16 0430 02/05/16 0400  WBC 12.1*  < > 8.9 8.7 4.3 7.8 14.5*  NEUTROABS 7.7  --   --   --  3.4 6.3 12.6*  HGB 17.6*  < > 12.5* 12.4* 6.7* 12.4* 13.1  HCT 54.9*  < > 38.2* 37.3* 22.3* 38.9* 39.3  MCV 103.0*  < > 99.2 101.4* 104.2* 102.9* 101.8*  PLT 75*  < > 58* 82* 47* 100* 131*  < > = values in this interval not displayed.  Cardiac Enzymes:  Recent Labs Lab 01/31/16 0741 01/31/16 1416 02/04/16 0430 02/05/16 0400  TROPONINI 0.75* 17.54* 2.37* 1.61*    Lipid Panel:  Recent Labs Lab 01/31/16 0549 02/03/16 0418  TRIG 1372* 82    CBG:  Recent Labs Lab 02/05/16 0343 02/05/16 0821 02/05/16 1145 02/05/16 1547 02/05/16 2014  GLUCAP 122* 118* 123* 108* 99    Microbiology: Results for orders placed or performed during the hospital encounter of 01/31/16  Culture, blood (routine x 2)     Status: Abnormal   Collection Time: 01/31/16  5:49 AM  Result Value Ref Range Status   Specimen Description BLOOD LEFT ARM  Final   Special Requests BOTTLES DRAWN AEROBIC AND ANAEROBIC  Final   Culture  Setup Time   Final    GRAM POSITIVE COCCI IN CLUSTERS ANAEROBIC BOTTLE ONLY Organism ID to follow CRITICAL RESULT CALLED TO, READ BACK BY AND VERIFIED WITH: Edd Arbour D AT 1243 02/01/16 BY L BENFIELD    Culture (A)  Final    STAPHYLOCOCCUS SPECIES (COAGULASE NEGATIVE) THE SIGNIFICANCE OF ISOLATING THIS ORGANISM FROM A SINGLE SET OF BLOOD CULTURES WHEN MULTIPLE SETS ARE DRAWN IS UNCERTAIN. PLEASE NOTIFY THE MICROBIOLOGY DEPARTMENT WITHIN ONE WEEK IF SPECIATION AND SENSITIVITIES ARE REQUIRED.    Report Status 02/03/2016 FINAL   Final  Blood Culture ID Panel (Reflexed)     Status: Abnormal   Collection Time: 01/31/16  5:49 AM  Result Value Ref Range Status   Enterococcus species NOT DETECTED NOT DETECTED Final   Vancomycin resistance NOT DETECTED NOT DETECTED Final   Listeria monocytogenes NOT DETECTED NOT DETECTED Final   Staphylococcus species DETECTED (A) NOT DETECTED Final    Comment: CRITICAL RESULT CALLED TO, READ BACK BY AND VERIFIED WITH: Edd Arbour D AT 1243 02/01/16 BY L BENFIELD    Staphylococcus aureus NOT DETECTED NOT DETECTED Final   Methicillin resistance NOT DETECTED NOT DETECTED Final   Streptococcus species NOT DETECTED NOT DETECTED Final   Streptococcus agalactiae NOT DETECTED NOT DETECTED Final   Streptococcus pneumoniae NOT DETECTED NOT DETECTED Final   Streptococcus pyogenes  NOT DETECTED NOT DETECTED Final   Acinetobacter baumannii NOT DETECTED NOT DETECTED Final   Enterobacteriaceae species NOT DETECTED NOT DETECTED Final   Enterobacter cloacae complex NOT DETECTED NOT DETECTED Final   Escherichia coli NOT DETECTED NOT DETECTED Final   Klebsiella oxytoca NOT DETECTED NOT DETECTED Final   Klebsiella pneumoniae NOT DETECTED NOT DETECTED Final   Proteus species NOT DETECTED NOT DETECTED Final   Serratia marcescens NOT DETECTED NOT DETECTED Final   Carbapenem resistance NOT DETECTED NOT DETECTED Final   Haemophilus influenzae NOT DETECTED NOT DETECTED Final   Neisseria meningitidis NOT DETECTED NOT DETECTED Final   Pseudomonas aeruginosa NOT DETECTED NOT DETECTED Final   Candida albicans NOT DETECTED NOT DETECTED Final   Candida glabrata NOT DETECTED NOT DETECTED Final   Candida krusei NOT DETECTED NOT DETECTED Final   Candida parapsilosis NOT DETECTED NOT DETECTED Final   Candida tropicalis NOT DETECTED NOT DETECTED Final  Culture, blood (routine x 2)     Status: None (Preliminary result)   Collection Time: 01/31/16  5:54 AM  Result Value Ref Range Status   Specimen Description  BLOOD LEFT ARM  Final   Special Requests BOTTLES DRAWN AEROBIC AND ANAEROBIC  Final   Culture NO GROWTH 4 DAYS  Final   Report Status PENDING  Incomplete  MRSA PCR Screening     Status: None   Collection Time: 01/31/16  6:34 AM  Result Value Ref Range Status   MRSA by PCR NEGATIVE NEGATIVE Final    Comment:        The GeneXpert MRSA Assay (FDA approved for NASAL specimens only), is one component of a comprehensive MRSA colonization surveillance program. It is not intended to diagnose MRSA infection nor to guide or monitor treatment for MRSA infections.   Urine culture     Status: None   Collection Time: 01/31/16  6:51 AM  Result Value Ref Range Status   Specimen Description URINE, CATHETERIZED  Final   Special Requests Normal  Final   Culture NO GROWTH  Final   Report Status 02/01/2016 FINAL  Final    Imaging: Ct Head Wo Contrast  02/04/2016  CLINICAL DATA:  Small subacute infarct right occipital region. Blood pressure instability. EXAM: CT HEAD WITHOUT CONTRAST TECHNIQUE: Contiguous axial images were obtained from the base of the skull through the vertex without intravenous contrast. COMPARISON:  01/31/2016 and 08/23/2015 as well as MRI 01/31/2016 FINDINGS: Postsurgical changes of the occipital skull. Ventricles, cisterns and other CSF spaces are within normal. Evidence of small old right occipital/posterior temporal infarct. Mild chronic ischemic microvascular disease is present. No evidence of mass, mass effect, shift of midline structures or acute hemorrhage. No definite acute right MCA territory infarct as suggested on recent MRI. Remainder the exam is unchanged. IMPRESSION: No acute intracranial findings. Chronic ischemic microvascular disease. Small old right occipital/ posterior temporal infarct. Electronically Signed   By: Elberta Fortis M.D.   On: 02/04/2016 11:23   Dg Chest Port 1 View  02/05/2016  CLINICAL DATA:  Respiratory failure, NSTEMI, CHF. EXAM: PORTABLE CHEST 1  VIEW COMPARISON:  Portable chest x-ray of Feb 04, 2016 FINDINGS: There is been interval extubation of the trachea and of the esophagus. The left lung is well-expanded. The right hemidiaphragm remains elevated. The pulmonary interstitial markings have improved especially on the right. The cardiac silhouette remains enlarged. The central pulmonary vascularity is mildly engorged. The right-sided PICC line tip projects over the midportion of the SVC. The bony thorax is unremarkable.  IMPRESSION: Improving CHF. Persistent elevation of the right hemidiaphragm. Probable small right pleural effusion and minimal subsegmental atelectasis at the right lung base. Electronically Signed   By: David  [place] M.D.   On: 02/05/2016 07:18   Dg Chest Port 1 View  02/04/2016  CLINICAL DATA:  Tube placement. EXAM: PORTABLE CHEST 1 VIEW COMPARISON:  02/04/2016 and 02/03/2016 FINDINGS: Patient rotated to the left. Endotracheal tube has tip 5 cm above the carina. Enteric tube courses into the region of the stomach and off the inferior portion of the film as tip is not visualized. Interval placement of right-sided PICC line with tip at the cavoatrial junction. Lungs are adequately inflated with persistent hazy opacification over the central right lung which may be due to asymmetric edema versus infection. Small amount right pleural fluid. Hazy left retrocardiac opacification unchanged likely effusion with atelectasis versus infection. Stable cardiomegaly. Remainder the exam is unchanged. IMPRESSION: Persistent hazy right central lung opacification suggesting asymmetric edema versus infection. Stable small right effusion. Stable left retrocardiac opacification likely small effusion with atelectasis although cannot exclude infection. Stable cardiomegaly. Tubes and lines as described. Electronically Signed   By: Elberta Fortis M.D.   On: 02/04/2016 15:16   Dg Chest Port 1 View  02/04/2016  CLINICAL DATA:  65 year old male with history of  respiratory failure. EXAM: PORTABLE CHEST 1 VIEW COMPARISON:  Chest x-ray 02/03/2016. FINDINGS: An endotracheal tube is in place with tip 4.2 cm above the carina. A nasogastric tube is seen extending into the stomach, however, the tip of the nasogastric tube extends below the lower margin of the image. Lung volumes are low. Mild elevation of the right hemidiaphragm. Interstitial prominence and ill-defined airspace disease throughout the right lung, most evident in the right lower lobe. Probable small right pleural effusion. Left lung appears relatively clear. Mild cardiomegaly. Upper mediastinal contours are within normal limits. IMPRESSION: 1. Support apparatus, as above. 2. Low lung volumes with worsening aeration throughout the right lung, the appearance of which is concerning for developing multilobar bronchopneumonia, most severe in the right lower lobe. 3. Probable small right pleural effusion. Electronically Signed   By: Trudie Reed M.D.   On: 02/04/2016 11:00   Dg Chest Port 1 View  02/03/2016  CLINICAL DATA:  Respiratory failure EXAM: PORTABLE CHEST 1 VIEW COMPARISON:  02/02/2016 FINDINGS: The endotracheal tube tip is 5.1 cm above the carina. The nasogastric tube extends into the stomach and beyond the inferior edge of the image. No dense consolidation. No large effusion. No pneumothorax. Mild linear basilar opacities persist, likely atelectatic. IMPRESSION: Support equipment appears satisfactorily positioned. No dense consolidation or large effusion. Electronically Signed   By: Ellery Plunk M.D.   On: 02/03/2016 03:38   Dg Chest Port 1 View  02/02/2016  CLINICAL DATA:  Hypoxia EXAM: PORTABLE CHEST 1 VIEW COMPARISON:  Feb 01, 2016 FINDINGS: Endotracheal tube tip is 2.5 cm above the carina. Nasogastric tube tip and side port are below the diaphragm. No pneumothorax. There is slight bibasilar atelectasis. Lungs elsewhere clear. Heart slightly enlarged with pulmonary vascularity within normal  limits. No adenopathy. IMPRESSION: Tube positions as described without pneumothorax. Mild bibasilar atelectasis. Lungs elsewhere clear. Stable cardiac prominence. Electronically Signed   By: Bretta Bang III M.D.   On: 02/02/2016 08:02    Assessment and plan:   Kevin [name] is an 65 y.o. male patient with with seizures, as described in the previous notes. His last known seizure was on 01/31/2016, currently on Keppra 1 gm  twice a day. No further clinical seizures. His long-term EEG previously was negative for seizures. Extubated, non focal neuro exam. No family at bedside.  No new recommendations from neurology standpoint. Continue Keppra 1 gm twice a day. We'll sign off. Please call if he has any further breakthrough seizures.

## 2016-02-05 NOTE — Progress Notes (Signed)
eLink Physician-Brief Progress Note Patient Name: SHAWNMICHAEL ANDERS DOB: 04/11/1951 MRN: [id number]   Date of Service  02/05/2016  HPI/Events of Note  maintiaing respn post self extubation. But anxious and BP MAP 137. On lasix with 4L ur op. On BiPAP on cam care and looks stable. Itneracting with RN  eICU Interventions  Versed x 1 BP meds depending on response to above     Intervention Category Major Interventions: Hypertension - evaluation and management  Ahmani Prehn 02/05/2016, 5:19 AM

## 2016-02-05 NOTE — Progress Notes (Signed)
MEDICATION RELATED NOTE   Pharmacy Re:  Home Medications   Assessment: Multiple attempts made to complete the home medication list for this patient.  Family member able to confirm some but not all.  His home medication bottles were all empty so not sure what medications he was on.  I have listed those for which refill information has been obtained for this year.  A.  Albuterol B.  Diltiazem CD C.  Furosemide D.  Isosorbide Mononitrate E.  Rosuvastatin F.  Thiamine G.  Verapamil  NOTE:  Apixaban was last refilled - 08/31/2015   Plan:  Consider medications and resume those you feel most appropriate. I have marked his Med Hx. Completed.  Medications:  Prescriptions prior to admission  Medication Sig Dispense Refill Last Dose  . apixaban (ELIQUIS) 5 MG TABS tablet Take 1 tablet (5 mg total) by mouth 2 (two) times daily. 120 tablet 0 unknown  . Multiple Vitamin (MULTIVITAMIN WITH MINERALS) TABS tablet Take 1 tablet by mouth daily. Men's One a Day   unknown  . diltiazem (CARDIZEM CD) 240 MG 24 hr capsule Take 1 capsule (240 mg total) by mouth daily. 90 capsule 0   . folic acid (FOLVITE) 1 MG tablet Take 1 tablet (1 mg total) by mouth daily. Need appointment before anymore refills 30 tablet 1 08/22/2015 at Unknown time  . furosemide (LASIX) 20 MG tablet Take 20 mg by mouth 2 (two) times daily.  2   . isosorbide mononitrate (IMDUR) 30 MG 24 hr tablet Take 1 tablet (30 mg total) by mouth daily. 90 tablet 0   . rosuvastatin (CRESTOR) 20 MG tablet Take 1 tablet (20 mg total) by mouth daily. (Patient taking differently: Take 20 mg by mouth at bedtime. ) 30 tablet 0   . verapamil (CALAN-SR) 120 MG CR tablet Take 120 mg by mouth daily.  1     Nadara Mustard [place] 02/05/2016,12:14 PM

## 2016-02-05 NOTE — Progress Notes (Signed)
eLink Physician-Brief Progress Note Patient Name: Kevin [name] DOB: May 19, 1951 MRN: [id number]   Date of Service  02/05/2016  HPI/Events of Note  Per RN - patient agitation had improved and was following commands while on precerdex and fent gtt. During turn self extubated  Cam care - on bipap now and not paradoxical and responsive per RN  eICU Interventions  bipap Extra 80mg  IV lasix Recheck abg 1am     Intervention Category Major Interventions: Respiratory failure - evaluation and management  Vana Arif 02/05/2016, 12:32 AM

## 2016-02-05 NOTE — Progress Notes (Signed)
Returned 250 mL bag of Fentanyl to the Pharmacy

## 2016-02-05 NOTE — Progress Notes (Signed)
eLink Physician-Brief Progress Note Patient Name: Kevin [name] DOB: 1951-07-27 MRN: [id number]   Date of Service  02/05/2016  HPI/Events of Note  SBP 180. MAP 128 and HR 135 -  A Fib RVR despite versed and prn labeatallol, hydralazine  eICU Interventions  Dc ydralazine due to reflex tachycardia Start cardizem gtt     Intervention Category Major Interventions: Arrhythmia - evaluation and management;Hypertension - evaluation and management  [name] 02/05/2016, 6:13 AM

## 2016-02-05 NOTE — Progress Notes (Signed)
Nutrition Follow-up  INTERVENTION:   Supplement diet once advanced as needed   NUTRITION DIAGNOSIS:   Inadequate oral intake related to inability to eat as evidenced by NPO status. Ongoing.   GOAL:   Patient will meet greater than or equal to 90% of their needs Not met.   MONITOR:   I & O's, Diet advancement, Labs  ASSESSMENT:   Pt with hx ETOH and cocaine abuse, admitted 05/10 with status epilepticus and possible right occipital CVA. He required intubation in ED and PCCM was called for admission.  Pt self-extubated Continues with aggressive diuresis  Medications reviewed and include: folic acid, thiamine CBG's: 118-123  Diet Order:    NPO  Skin:  Reviewed, no issues  Last BM:  unknown  Height:   Ht Readings from Last 1 Encounters:  01/31/16 _0  (1.803 m)    Weight:   Wt Readings from Last 1 Encounters:  02/05/16 220 lb 7.4 oz (100 kg)    Ideal Body Weight:  78.2 kg  BMI:  Body mass index is 30.76 kg/(m^2).  Estimated Nutritional Needs:   Kcal:  2100-2300  Protein:  110-130 grams  Fluid:  > 2 L/day  EDUCATION NEEDS:   No education needs identified at this time  [place], [place], [place] Pager [phone] After Hours Pager

## 2016-02-05 NOTE — Progress Notes (Signed)
Pt self extubated while completing a turn during a bath.  Pt bagged and immediately placed on BiPap.  [facility] paged and responded via in-room camera.  Instructed to stop all sedation; administer one-time 80 mg dose of Lasix; Lopressor PRN for elevated HR. RT to perform follow-up ABG at 01:00 per order.  Pt currently resting comfortably.  Will continue to monitor.

## 2016-02-05 NOTE — Progress Notes (Signed)
Wasted 150 mL Fentanyl with Philis Nettle, RN.

## 2016-02-05 NOTE — Progress Notes (Signed)
ANTICOAGULATION CONSULT NOTE - Follow Up Consult  Pharmacy Consult:  Heparin Indication: atrial fibrillation  No Known Allergies  Patient Measurements: Height: 5\' 11"  (180.3 cm) Weight: 220 lb 7.4 oz (100 kg) IBW/kg (Calculated) : 75.3  Heparin dosing weight = 96 kg  Vital Signs: Temp: 98 F (36.7 C) (05/15 0354) Temp Source: Oral (05/15 0354) BP: 148/97 mmHg (05/15 0715) Pulse Rate: 106 (05/15 0715)  Labs:  Recent Labs  02/03/16 0418  02/04/16 0312 02/04/16 0430 02/04/16 0838 02/04/16 1515 02/05/16 0400  HGB 12.4*  --  6.7* 12.4*  --   --  13.1  HCT 37.3*  --  22.3* 38.9*  --   --  39.3  PLT 82*  --  47* 100*  --   --  131*  HEPARINUNFRC  --   < > <0.10*  --  0.38 0.36 0.36  CREATININE 1.66*  --   --  1.58*  --   --  1.43*  TROPONINI  --   --   --  2.37*  --   --  1.61*  < > = values in this interval not displayed.  Estimated Creatinine Clearance: 62.9 mL/min (by C-G formula based on Cr of 1.43).    Assessment: 61 YOM with acute CVA to continue on IV heparin for history of Afib while Eliquis is on hold. Heparin level is therapeutic 0.36; no bleeding reported and patient's CBC is improving.   Goal of Therapy:  Heparin level 0.3-0.5 units/ml d/t acute CVA Monitor platelets by anticoagulation protocol: Yes    Plan:  - Continue heparin gtt at 1350 units/hr - Daily HL / CBC - Monitor s/sx bleeding - Hold Eliquis  Babs Bertin, PharmD, [facility] Clinical Pharmacist Pager [phone] 02/05/2016 7:38 AM

## 2016-02-05 NOTE — Progress Notes (Signed)
Pt extubated during bath. Placed pt on NIV/PC 10/5 40%. Pt tolerating well at this time. ABG pending

## 2016-02-05 NOTE — Progress Notes (Signed)
     SUBJECTIVE:No chest pain or dyspnea.   Tele: atrial fib with rate 90-100  BP 176/112 mmHg  Pulse 97  Temp(Src) 98.9 F (37.2 C) (Oral)  Resp 36  Ht 5\' 11"  (1.803 m)  Wt 220 lb 7.4 oz (100 kg)  BMI 30.76 kg/m2  SpO2 95%  Intake/Output Summary (Last 24 hours) at 02/05/16 1127 Last data filed at 02/05/16 1000  Gross per 24 hour  Intake 2981.56 ml  Output   8755 ml  Net -5773.44 ml    PHYSICAL EXAM General: Well developed, well nourished, in no acute distress. Alert and oriented x 3.  Psych:  Good affect, responds appropriately Neck: No JVD. No masses noted.  Lungs: Clear bilaterally with no wheezes or rhonci noted.  Heart: Irreg irreg with no murmurs noted. Abdomen: Bowel sounds are present. Soft, non-tender.  Extremities: No lower extremity edema.   LABS: Basic Metabolic Panel:  Recent Labs  02/40/97 0430 02/05/16 0400  NA 143 144  K 3.8 3.7  CL 111 109  CO2 21* 25  GLUCOSE 143* 148*  BUN 26* 26*  CREATININE 1.58* 1.43*  CALCIUM 8.6* 8.6*  MG 2.3 1.8  PHOS 2.7 3.6   CBC:  Recent Labs  02/04/16 0430 02/05/16 0400  WBC 7.8 14.5*  NEUTROABS 6.3 12.6*  HGB 12.4* 13.1  HCT 38.9* 39.3  MCV 102.9* 101.8*  PLT 100* 131*   Cardiac Enzymes:  Recent Labs  02/04/16 0430 02/05/16 0400  TROPONINI 2.37* 1.61*   Fasting Lipid Panel:  Recent Labs  02/03/16 0418  TRIG 82    Current Meds: . ampicillin-sulbactam (UNASYN) IV  3 g Intravenous Q8H  . [START ON 02/06/2016] aspirin  81 mg Oral Daily  . folic acid  1 mg Intravenous Daily  . furosemide  80 mg Intravenous Q8H  . insulin aspart  0-15 Units Subcutaneous Q4H  . levETIRAcetam  1,000 mg Intravenous Q12H  . metoprolol tartrate  25 mg Oral BID  . pantoprazole (PROTONIX) IV  40 mg Intravenous QHS  . rosuvastatin  20 mg Oral q1800  . sodium chloride flush  10-40 mL Intracatheter Q12H  . thiamine  100 mg Intravenous Daily     ASSESSMENT AND PLAN: Pt admitted with seizure, atrial fib with  RVR, respiratory failure.   1. CAD/elevated troponin: In setting of stroke, atrial fib with RVR. Last cath in January 2016 and found to have severe stenosis in the moderate caliber Ramus Intermediate branch and moderate stenosis in the small distal RCA. He was not felt to be a candidate for coronary stenting at that time due to his history of medication non-compliance and poly-substance abuse. At this time, he is having no chest pain. Troponin is trending down. Would favor conservative approach from cardiac standpoint. Would start Plavix 75 mg daily when taking po meds. Continue ASA, beta blocker and statin.   2. Atrial fib with RVR: chronic, persistent atrial fib. He had RVR last night. Now on Cardizem drip and rate controlled. Convert to Cardizem 240 mg po daily when taking po meds. (home dosage). Restart Eliquis when taking po meds.  Verne Carrow  5/15/201711:27 AM

## 2016-02-05 NOTE — Progress Notes (Signed)
PULMONARY / CRITICAL CARE MEDICINE   Name: Kevin [name] MRN: [id number] DOB: [birth year]    ADMISSION DATE:  01/31/2016 CONSULTATION DATE:  01/31/16  REFERRING MD:  EDP  CHIEF COMPLAINT:  Seizures  BRIEF  AMAAN [name] is a 65 y.o. male with PMH as outlined below. He was in his USOH up until early AM 01/31/16.  Wife states he went to bed and was last seen normal around 1am.  At 2am, he was vomiting and had eye deviation to the right.  EMS was summoned and pt was brought to [facility] ED for further evaluation.  He was initially brought in as code stroke.  On ED arrival, pt began to have grand mal seizure.  He received 2mg  ativan followed by 2nd dose of 2mg .  Neurology was at bedside and ordered an 1g Keppra load.  Pt was then intubated for airway protection.  Prior to intubation, he had AFRVR with HR as high as 170's.  After intubation and propofol, HR decreased to 150's.  CT of the head revealed equivocal subacute right occipital infarction but was otherwise unremarkable.  PCCM was called for admission.   STUDIES:  CT head 05/10 > small subacute infarct in right occipital lobe.  No hemorrhage. CXR 05/10 > LUL opacity. CT head 05/14 > no acute process.  CULTURES: Blood 05/10 > CNS > Sputum 05/10 >  ANTIBIOTICS: Unasyn 05/10 >  SIGNIFICANT EVENTS: 05/10 > admitted with status epilepticus, required intubation.  01/22/16 - ECHO ef 35%  LINES / TUBES: ETT 05/10 >>> 5/14   SUBJECTIVE/OVERNIGHT/INTERVAL HX Self extubated during turning / repositioning.  Developed HTN and started on hydralazine.  Later had AF RVR, hydralazine d/c'd and cardizem gtt started. Doing well this AM, no complaints.   VITAL SIGNS: BP 169/91 mmHg  Pulse 91  Temp(Src) 98.9 F (37.2 C) (Oral)  Resp 36  Ht 5\' 11"  (1.803 m)  Wt 220 lb 7.4 oz (100 kg)  BMI 30.76 kg/m2  SpO2 95%  HEMODYNAMICS:    VENTILATOR SETTINGS: Vent Mode:  [-] PCV;BIPAP FiO2 (%):  [40 %-100 %] 40 % Set Rate:  [8 bmp-20  bmp] 8 bmp Vt Set:  [600 mL] 600 mL PEEP:  [5 cmH20-10 cmH20] 5 cmH20 Plateau Pressure:  [21 cmH20-28 cmH20] 23 cmH20  INTAKE / OUTPUT: I/O last 3 completed shifts: In: 6917.3 [I.V.:3244.8; NG/GT:2522.5; IV Piggyback:1150] Out: 8505 [Urine:8505]  PHYSICAL EXAMINATION: General: Adult male, in NAD. Neuro:   A&O x 3, no focal deficits. HEENT: [place]/AT. PERRL, sclerae anicteric. Cardiovascular: IRIR, no M/R/G.  Lungs: Respirations even and unlabored.  Clear bilaterally. Abdomen: BS x 4, soft, NT/ND.  Musculoskeletal: No gross deformities, no edema.  Skin: Intact, warm, no rashes.  LABS: PULMONARY  Recent Labs Lab 02/01/16 0406 02/02/16 0300 02/03/16 0535 02/04/16 0929 02/05/16 0100  PHART 7.399 7.463* 7.379 7.334* 7.420  PCO2ART 29.8* 25.5* 35.1 30.6* 35.5  PO2ART 107.0* 122* 65.8* 95.0 71.8*  HCO3 18.5* 18.2* 20.3 16.0* 22.6  TCO2 19 19.0 21.4 17 23.6  O2SAT 98.0 98.9 90.8 96.0 93.9    CBC  Recent Labs Lab 02/04/16 0312 02/04/16 0430 02/05/16 0400  HGB 6.7* 12.4* 13.1  HCT 22.3* 38.9* 39.3  WBC 4.3 7.8 14.5*  PLT 47* 100* 131*    COAGULATION  Recent Labs Lab 01/31/16 0300  INR 1.27    CARDIAC    Recent Labs Lab 01/31/16 0741 01/31/16 1416 02/04/16 0430 02/05/16 0400  TROPONINI 0.75* 17.54* 2.37* 1.61*   No results for input(s):  PROBNP in the last 168 hours.   CHEMISTRY  Recent Labs Lab 02/01/16 0326 02/02/16 0332 02/03/16 0418 02/04/16 0430 02/05/16 0400  NA 139 143 143 143 144  K 3.3* 3.8 3.5 3.8 3.7  CL 108 112* 110 111 109  CO2 19* 19* 22 21* 25  GLUCOSE 149* 100* 118* 143* 148*  BUN 23* 30* 27* 26* 26*  CREATININE 1.90* 1.72* 1.66* 1.58* 1.43*  CALCIUM 8.0* 8.3* 8.1* 8.6* 8.6*  MG 2.3 2.3 1.9 2.3 1.8  PHOS 3.4 2.7 2.8 2.7 3.6   Estimated Creatinine Clearance: 62.9 mL/min (by C-G formula based on Cr of 1.43).   LIVER  Recent Labs Lab 01/31/16 0300  AST 41  ALT 30  ALKPHOS 124  BILITOT 1.3*  PROT 8.8*  ALBUMIN 4.6   INR 1.27     INFECTIOUS  Recent Labs Lab 01/31/16 0549 01/31/16 0742 01/31/16 1138  LATICACIDVEN 7.1* 5.2* 3.7*     ENDOCRINE CBG (last 3)   Recent Labs  02/04/16 2352 02/05/16 0343 02/05/16 0821  GLUCAP 142* 122* 118*         IMAGING x48h  - image(s) personally visualized  -   highlighted in bold Ct Head Wo Contrast  02/04/2016  CLINICAL DATA:  Small subacute infarct right occipital region. Blood pressure instability. EXAM: CT HEAD WITHOUT CONTRAST TECHNIQUE: Contiguous axial images were obtained from the base of the skull through the vertex without intravenous contrast. COMPARISON:  01/31/2016 and 08/23/2015 as well as MRI 01/31/2016 FINDINGS: Postsurgical changes of the occipital skull. Ventricles, cisterns and other CSF spaces are within normal. Evidence of small old right occipital/posterior temporal infarct. Mild chronic ischemic microvascular disease is present. No evidence of mass, mass effect, shift of midline structures or acute hemorrhage. No definite acute right MCA territory infarct as suggested on recent MRI. Remainder the exam is unchanged. IMPRESSION: No acute intracranial findings. Chronic ischemic microvascular disease. Small old right occipital/ posterior temporal infarct. Electronically Signed   By: Elberta Fortis M.D.   On: 02/04/2016 11:23   Dg Chest Port 1 View  02/05/2016  CLINICAL DATA:  Respiratory failure, NSTEMI, CHF. EXAM: PORTABLE CHEST 1 VIEW COMPARISON:  Portable chest x-ray of Feb 04, 2016 FINDINGS: There is been interval extubation of the trachea and of the esophagus. The left lung is well-expanded. The right hemidiaphragm remains elevated. The pulmonary interstitial markings have improved especially on the right. The cardiac silhouette remains enlarged. The central pulmonary vascularity is mildly engorged. The right-sided PICC line tip projects over the midportion of the SVC. The bony thorax is unremarkable. IMPRESSION: Improving CHF. Persistent  elevation of the right hemidiaphragm. Probable small right pleural effusion and minimal subsegmental atelectasis at the right lung base. Electronically Signed   By: David  [place] M.D.   On: 02/05/2016 07:18   Dg Chest Port 1 View  02/04/2016  CLINICAL DATA:  Tube placement. EXAM: PORTABLE CHEST 1 VIEW COMPARISON:  02/04/2016 and 02/03/2016 FINDINGS: Patient rotated to the left. Endotracheal tube has tip 5 cm above the carina. Enteric tube courses into the region of the stomach and off the inferior portion of the film as tip is not visualized. Interval placement of right-sided PICC line with tip at the cavoatrial junction. Lungs are adequately inflated with persistent hazy opacification over the central right lung which may be due to asymmetric edema versus infection. Small amount right pleural fluid. Hazy left retrocardiac opacification unchanged likely effusion with atelectasis versus infection. Stable cardiomegaly. Remainder the exam is unchanged. IMPRESSION: Persistent  hazy right central lung opacification suggesting asymmetric edema versus infection. Stable small right effusion. Stable left retrocardiac opacification likely small effusion with atelectasis although cannot exclude infection. Stable cardiomegaly. Tubes and lines as described. Electronically Signed   By: Elberta Fortis M.D.   On: 02/04/2016 15:16   Dg Chest Port 1 View  02/04/2016  CLINICAL DATA:  65 year old male with history of respiratory failure. EXAM: PORTABLE CHEST 1 VIEW COMPARISON:  Chest x-ray 02/03/2016. FINDINGS: An endotracheal tube is in place with tip 4.2 cm above the carina. A nasogastric tube is seen extending into the stomach, however, the tip of the nasogastric tube extends below the lower margin of the image. Lung volumes are low. Mild elevation of the right hemidiaphragm. Interstitial prominence and ill-defined airspace disease throughout the right lung, most evident in the right lower lobe. Probable small right pleural  effusion. Left lung appears relatively clear. Mild cardiomegaly. Upper mediastinal contours are within normal limits. IMPRESSION: 1. Support apparatus, as above. 2. Low lung volumes with worsening aeration throughout the right lung, the appearance of which is concerning for developing multilobar bronchopneumonia, most severe in the right lower lobe. 3. Probable small right pleural effusion. Electronically Signed   By: Trudie Reed M.D.   On: 02/04/2016 11:00        DISCUSSION: 65 y.o. M with hx ETOH and cocaine abuse, admitted 05/10 with status epilepticus and possible right occipital CVA.  He required intubation in ED and PCCM was called for admission.  ASSESSMENT / PLAN:  NEUROLOGIC A:   Acute metabolic encephalopathy - resolved 05/15. Status epilepticus, no seizure on continuous EEG. Subacute CVA. ETOH use. Polysubstance use - UDS in past positive for cocaine and THC.  This admit positive for Marijuana. P:   D/c sedation. Neuro following. Continue antiepileptics per neuro. Continue Thiamine / Folate. Substance abuse counseling once extubated.  PULMONARY A: VDRF - due to inability to protect airway in the setting of status epilepticus.  Self extubated 05/14 and done well since. Concern for aspiration - per EDP, pt had frothy secretions during intubation. Tobacco dependence. P:   Continue supplemental O2 as needed to maintain SpO2 > 92%. Pulmonary hygiene. CXR intermittently. Tobacco cessation counseling.  CARDIOVASCULAR A:  Hypertensive emergency. A.fib with RVR - on eliquis. Hx HTN, CHF (EF 55-60% in Dec 2016). NSTEMI - latest cards note 02/01/16 - not a interventional candidate. ECHO 02/01/16 with EF 35% P:  Start aggressive lasix 02/04/16 - UOP improved since starting. Continue cardene gtt. Overlap with PO cardizem once swallowing is good. Pt also on PO verapamil as outpt.  Continue lopressor to 25 BID. Cards following, appreciate the assistance. IV heparin gtt -  continue for now until cleared for PO then can resume eliquis. Start clopidogrel once able to take PO - per cards recs. Continue outpatient rosuvastatin. Hold outpatient cardizem, imdur - restart once cleared for PO.  RENAL A:   AKI on CKD - improving. P:   Continue aggressive diuresis. Replace electrolytes as indicated. Reduce fluid rate to kvo. BMP in AM.  GASTROINTESTINAL A:   Hx HCV. GI prophylaxis. Nutrition. P:   SUP: Pantoprazole. SLP eval today.  HEMATOLOGIC A:   Thrombocytopenia - presumed due to bone marrow suppression due to ETOH. On chronic anticoagulation due to AFRVR. VTE Prophylaxis. P:  Monitor platelet counts. SCD's / heparin gtt. CBC in AM.  INFECTIOUS A:   Concern for aspiration - per EDP, pt had frothy secretions during intubation. P:   Continue unasyn through 05/17.  ENDOCRINE A:   Hyperglycemia - no hx DM. P:   SSI. Assess Hgb A1c.   Family updated: No family bedside 02/05/2016.  Interdisciplinary Family Meeting v Palliative Care Meeting:  Due by: 02/06/16.  CC time:  30 minutes.  Continue cardizem gtt, await SLP eval.  If cleared then resume all cardiac meds PO.  Once off gtt's then can likely transfer out of ICU.   Rutherford Guys, [place] [facility] [place] [facility] & Critical [facility] Medicine Pager: [phone]  or [phone] 02/05/2016, 10:41 AM     ATTENDING NOTE / ATTESTATION NOTE :   I have discussed the case with the resident/APP Rutherford Guys  I agree with the resident/APP's  history, physical examination, assessment, and plans.  I have edited the above note and modified it according to our agreed history, physical examination, assessment and plan.   I have spent 31 minutes of critical care time with this patient today.  Family :Family updated at length today.  Wife updated.    Pollie Meyer, MD 02/05/2016, 12:03 PM [place] [facility] and Critical [facility] Pager [phone] After 3 pm or if no answer, call  [phone]

## 2016-02-06 DIAGNOSIS — I159 Secondary hypertension, unspecified: Secondary | ICD-10-CM | POA: Diagnosis present

## 2016-02-06 DIAGNOSIS — R7989 Other specified abnormal findings of blood chemistry: Secondary | ICD-10-CM

## 2016-02-06 DIAGNOSIS — R778 Other specified abnormalities of plasma proteins: Secondary | ICD-10-CM | POA: Diagnosis present

## 2016-02-06 LAB — BASIC METABOLIC PANEL
Anion gap: 13 (ref 5–15)
BUN: 24 mg/dL — ABNORMAL HIGH (ref 6–20)
CO2: 28 mmol/L (ref 22–32)
Calcium: 8.9 mg/dL (ref 8.9–10.3)
Chloride: 103 mmol/L (ref 101–111)
Creatinine, Ser: 1.56 mg/dL — ABNORMAL HIGH (ref 0.61–1.24)
GFR calc Af Amer: 52 mL/min — ABNORMAL LOW (ref >60)
GFR calc non Af Amer: 45 mL/min — ABNORMAL LOW (ref >60)
Glucose, Bld: 112 mg/dL — ABNORMAL HIGH (ref 65–99)
Potassium: 3 mmol/L — ABNORMAL LOW (ref 3.5–5.1)
Sodium: 144 mmol/L (ref 135–145)

## 2016-02-06 LAB — HEPARIN LEVEL (UNFRACTIONATED): Heparin Unfractionated: 0.35 IU/mL (ref 0.30–0.70)

## 2016-02-06 LAB — CBC WITH DIFFERENTIAL/PLATELET
Basophils Absolute: 0 10*3/uL (ref 0.0–0.1)
Basophils Relative: 0 %
Eosinophils Absolute: 0.1 10*3/uL (ref 0.0–0.7)
Eosinophils Relative: 0 %
HCT: 39.5 % (ref 39.0–52.0)
Hemoglobin: 12.6 g/dL — ABNORMAL LOW (ref 13.0–17.0)
Lymphocytes Relative: 6 %
Lymphs Abs: 0.7 10*3/uL (ref 0.7–4.0)
MCH: 32.6 pg (ref 26.0–34.0)
MCHC: 31.9 g/dL (ref 30.0–36.0)
MCV: 102.1 fL — ABNORMAL HIGH (ref 78.0–100.0)
Monocytes Absolute: 1.1 10*3/uL — ABNORMAL HIGH (ref 0.1–1.0)
Monocytes Relative: 9 %
Neutro Abs: 10.1 10*3/uL — ABNORMAL HIGH (ref 1.7–7.7)
Neutrophils Relative %: 85 %
Platelets: 125 10*3/uL — ABNORMAL LOW (ref 150–400)
RBC: 3.87 MIL/uL — ABNORMAL LOW (ref 4.22–5.81)
RDW: 13 % (ref 11.5–15.5)
WBC: 12 10*3/uL — ABNORMAL HIGH (ref 4.0–10.5)

## 2016-02-06 LAB — MAGNESIUM: Magnesium: 1.7 mg/dL (ref 1.7–2.4)

## 2016-02-06 LAB — PHOSPHORUS: Phosphorus: 3.1 mg/dL (ref 2.5–4.6)

## 2016-02-06 LAB — CULTURE, BLOOD (ROUTINE X 2): Culture: NO GROWTH

## 2016-02-06 LAB — GLUCOSE, CAPILLARY
Glucose-Capillary: 112 mg/dL — ABNORMAL HIGH (ref 65–99)
Glucose-Capillary: 129 mg/dL — ABNORMAL HIGH (ref 65–99)
Glucose-Capillary: 135 mg/dL — ABNORMAL HIGH (ref 65–99)

## 2016-02-06 MED ORDER — MAGNESIUM SULFATE 2 GM/50ML IV SOLN
2.0000 g | Freq: Once | INTRAVENOUS | Status: AC
  Administered 2016-02-06: 2 g via INTRAVENOUS
  Filled 2016-02-06: qty 50

## 2016-02-06 MED ORDER — POTASSIUM CHLORIDE 10 MEQ/50ML IV SOLN
10.0000 meq | INTRAVENOUS | Status: AC
  Administered 2016-02-06 (×3): 10 meq via INTRAVENOUS
  Filled 2016-02-06 (×3): qty 50

## 2016-02-06 MED ORDER — FUROSEMIDE 20 MG PO TABS
20.0000 mg | ORAL_TABLET | Freq: Every day | ORAL | Status: DC
  Administered 2016-02-06 – 2016-02-08 (×3): 20 mg via ORAL
  Filled 2016-02-06 (×3): qty 1

## 2016-02-06 MED ORDER — APIXABAN 5 MG PO TABS
5.0000 mg | ORAL_TABLET | Freq: Two times a day (BID) | ORAL | Status: DC
  Administered 2016-02-06 – 2016-02-09 (×7): 5 mg via ORAL
  Filled 2016-02-06 (×8): qty 1

## 2016-02-06 MED ORDER — DILTIAZEM HCL ER COATED BEADS 120 MG PO CP24
240.0000 mg | ORAL_CAPSULE | Freq: Every day | ORAL | Status: DC
  Administered 2016-02-06 – 2016-02-09 (×4): 240 mg via ORAL
  Filled 2016-02-06: qty 2
  Filled 2016-02-06: qty 1
  Filled 2016-02-06 (×2): qty 2

## 2016-02-06 NOTE — Progress Notes (Signed)
eLink Physician-Brief Progress Note Patient Name: YAACOV KIRKMAN DOB: September 27, 1950 MRN: [id number]   eLink Physician Progress Note and Electrolyte Replacement  Patient Name: DOMONICK FILTER DOB: 17-Oct-1950 MRN: [id number]  Date of Service  02/06/2016   HPI/Events of Note    Recent Labs Lab 02/02/16 0332 02/03/16 0418 02/04/16 0430 02/05/16 0400 02/06/16 0336  NA 143 143 143 144 144  K 3.8 3.5 3.8 3.7 3.0*  CL 112* 110 111 109 103  CO2 19* 22 21* 25 28  GLUCOSE 100* 118* 143* 148* 112*  BUN 30* 27* 26* 26* 24*  CREATININE 1.72* 1.66* 1.58* 1.43* 1.56*  CALCIUM 8.3* 8.1* 8.6* 8.6* 8.9  MG 2.3 1.9 2.3 1.8 1.7  PHOS 2.7 2.8 2.7 3.6 3.1    Estimated Creatinine Clearance: 57.6 mL/min (by C-G formula based on Cr of 1.56).  Intake/Output      05/15 0701 - 05/16 0700   I.V. (mL/kg) 498.3 (5)   IV Piggyback 310   Total Intake(mL/kg) 808.3 (8.1)   Urine (mL/kg/hr) 3620 (1.5)   Total Output 3620   Net -2811.7       Urine Occurrence 1 x    - I/O DETAILED x 24h    Total I/O In: 287.6 [I.V.:187.6; IV Piggyback:100] Out: 115 [Urine:115] - I/O THIS SHIFT    ASSESSMENT Low k Low mag  eICURN Interventions  Replete both   ASSESSMENT: MAJOR ELECTROLYTE      Dr. Kalman Shan, M.D., [facility].C.P Pulmonary and Critical Care Medicine Staff Physician [facility]  Pulmonary and Critical Care Pager: [phone], If no answer or between  15:00h - 7:00h: call [phone]  02/06/2016 4:48 AM        Intervention Category Major Interventions: Electrolyte abnormality - evaluation and management  Dayle Sherpa 02/06/2016, 4:48 AM

## 2016-02-06 NOTE — Progress Notes (Signed)
Pharmacy Antibiotic Note  Kevin [name] is a 65 y.o. male admitted on 01/31/2016 with pneumonia.  Pharmacy has been consulted for Unasyn dosing. AF, wbc down to 12, CrCl~56. Abx D#7.  Plan: - Unasyn 3gm IV Q8H - Monitor renal fxn, clinical progress, abx LOT  Height: 5\' 11"  (180.3 cm) Weight: 208 lb 12.4 oz (94.7 kg) IBW/kg (Calculated) : 75.3  Temp (24hrs), Avg:98.4 F (36.9 C), Min:97.9 F (36.6 C), Max:98.9 F (37.2 C)   Recent Labs Lab 01/31/16 0323 01/31/16 0549 01/31/16 0742 01/31/16 1138  02/02/16 0332 02/03/16 0418 02/04/16 0312 02/04/16 0430 02/05/16 0400 02/06/16 0336  WBC  --   --   --   --   < > 8.9 8.7 4.3 7.8 14.5* 12.0*  CREATININE 1.80*  --   --   --   < > 1.72* 1.66*  --  1.58* 1.43* 1.56*  LATICACIDVEN >17.00* 7.1* 5.2* 3.7*  --   --   --   --   --   --   --   < > = values in this interval not displayed.  Estimated Creatinine Clearance: 56.2 mL/min (by C-G formula based on Cr of 1.56).    No Known Allergies  Antimicrobials this admission: Unasyn 5/10>>  Dose adjustments this admission:   Microbiology results: 5/10 MRSA PCR - negative 5/10 UCx - negative 5/10 BCID - Staph species (not mr) 5/10 BCx x2 - CoNS (1 of 2)  Babs Bertin, PharmD, [facility] Clinical Pharmacist Pager [phone] 02/06/2016 7:58 AM

## 2016-02-06 NOTE — Progress Notes (Signed)
[facility] ADULT ICU REPLACEMENT PROTOCOL FOR AM LAB REPLACEMENT ONLY  The patient does not apply for the [facility] Adult ICU Electrolyte Replacment Protocol based on the criteria listed below:     Is urine output >/= 0.5 ml/kg/hr for the last 6 hours? No. Patient's UOP is not measured    Abnormal electrolyte(s): K3.0  If a panic level lab has been reported, has the CCM MD in charge been notified? Yes.  .   Physician:  Maryjean Morn 02/06/2016 4:40 AM

## 2016-02-06 NOTE — Progress Notes (Signed)
     SUBJECTIVE: no pain or dyspnea.   Tele: atrial fib, rate 80s.   BP 159/86 mmHg  Pulse 86  Temp(Src) 98.2 F (36.8 C) (Oral)  Resp 24  Ht 5\' 11"  (1.803 m)  Wt 208 lb 12.4 oz (94.7 kg)  BMI 29.13 kg/m2  SpO2 96%  Intake/Output Summary (Last 24 hours) at 02/06/16 1145 Last data filed at 02/06/16 1000  Gross per 24 hour  Intake 1351.42 ml  Output   4655 ml  Net -3303.58 ml    PHYSICAL EXAM General: Well developed, well nourished, in no acute distress. Alert and oriented x 3.  Psych:  Good affect, responds appropriately Neck: No JVD. No masses noted.  Lungs: Clear bilaterally with no wheezes or rhonci noted.  Heart: irreg irreg with no murmurs noted. Abdomen: Bowel sounds are present. Soft, non-tender.  Extremities: No lower extremity edema.   LABS: Basic Metabolic Panel:  Recent Labs  81/44/81 0400 02/06/16 0336  NA 144 144  K 3.7 3.0*  CL 109 103  CO2 25 28  GLUCOSE 148* 112*  BUN 26* 24*  CREATININE 1.43* 1.56*  CALCIUM 8.6* 8.9  MG 1.8 1.7  PHOS 3.6 3.1   CBC:  Recent Labs  02/05/16 0400 02/06/16 0336  WBC 14.5* 12.0*  NEUTROABS 12.6* 10.1*  HGB 13.1 12.6*  HCT 39.3 39.5  MCV 101.8* 102.1*  PLT 131* 125*   Cardiac Enzymes:  Recent Labs  02/04/16 0430 02/05/16 0400  TROPONINI 2.37* 1.61*   Current Meds: . apixaban  5 mg Oral BID  . aspirin  81 mg Oral Daily  . diltiazem  240 mg Oral Daily  . folic acid  1 mg Intravenous Daily  . furosemide  20 mg Oral Daily  . insulin aspart  0-15 Units Subcutaneous Q4H  . levETIRAcetam  1,000 mg Intravenous Q12H  . metoprolol tartrate  25 mg Oral BID  . pantoprazole (PROTONIX) IV  40 mg Intravenous QHS  . rosuvastatin  20 mg Oral q1800  . sodium chloride flush  10-40 mL Intracatheter Q12H  . thiamine  100 mg Intravenous Daily     ASSESSMENT AND PLAN: Pt admitted with seizure, atrial fib with RVR, respiratory failure.   1. CAD/elevated troponin: Elevated troponin in setting of stroke,  atrial fib with RVR. Last cath in January 2016 and found to have severe stenosis in the moderate caliber Ramus Intermediate branch and moderate stenosis in the small distal RCA. He was not felt to be a candidate for coronary stenting at that time due to his history of medication non-compliance and poly-substance abuse. At this time, he is having no chest pain. Troponin is trending down. Would favor conservative approach from cardiac standpoint. Would continue ASA 81 mg daily. Would not restart Plavix since he is on Eliquis. Continue beta blocker and statin.   2. Atrial fib with RVR: chronic, persistent atrial fib. Rate controlled on po Cardizem and now anti-coagulated on Eliquis.      Verne Carrow  5/16/201711:45 AM

## 2016-02-06 NOTE — Progress Notes (Addendum)
PULMONARY / CRITICAL CARE MEDICINE   Name: Kevin [name] MRN: [id number] DOB: 1951-02-22    ADMISSION DATE:  01/31/2016 CONSULTATION DATE:  01/31/16  REFERRING MD:  EDP  CHIEF COMPLAINT:  Seizures  BRIEF  Kevin [name] is a 65 y.o. male with PMH as outlined below. He was in his USOH up until early AM 01/31/16.  Wife states he went to bed and was last seen normal around 1am.  At 2am, he was vomiting and had eye deviation to the right.  EMS was summoned and pt was brought to [facility] ED for further evaluation.  He was initially brought in as code stroke.  On ED arrival, pt began to have grand mal seizure.  He received  ativan followed by 2nd dose of .  Neurology was at bedside and ordered an 1g Keppra load.  Pt was then intubated for airway protection.  Prior to intubation, he had AFRVR with HR as high as 170's.  After intubation and propofol, HR decreased to 150's.  CT of the head revealed equivocal subacute right occipital infarction but was otherwise unremarkable.  PCCM was called for admission.   STUDIES:  CT head 05/10 > small subacute infarct in right occipital lobe.  No hemorrhage. CXR 05/10 > LUL opacity. CT head 05/14 > no acute process.  CULTURES: Blood 05/10 >  (-) in 1 and CNS in the other.  MRSA (-)  5/10  ANTIBIOTICS: Unasyn 05/10 >  SIGNIFICANT EVENTS: 05/10 > admitted with status epilepticus, required intubation.  01/22/16 - ECHO ef 35%  LINES / TUBES: ETT 05/10 >>> 5/14   SUBJECTIVE Doing well. On cardizem drip for htn and afib rvr. No worsening resp status.  Passed swallow evaln.   VITAL SIGNS: BP 134/70 mmHg  Pulse 164  Temp(Src) 98.2 F (36.8 C) (Oral)  Resp 18  Ht  (1.803 m)  Wt 94.7 kg (208 lb 12.4 oz)  BMI 29.13 kg/m2  SpO2 97%  HEMODYNAMICS:    VENTILATOR SETTINGS:    INTAKE / OUTPUT: I/O last 3 completed shifts: In: 2221.8 [I.V.:1059.3; NG/GT:442.5; IV Piggyback:720] Out: 40981 [Urine:10580]  PHYSICAL  EXAMINATION: General: Adult male, in NAD. Neuro:   A&O x 3, no focal deficits. HEENT: [place]/AT. PERRL, sclerae anicteric. Cardiovascular: IRIR, no M/R/G.  Lungs: Respirations even and unlabored.  Bibasilar crackles.  Abdomen: BS x 4, soft, NT/ND.  Musculoskeletal: No gross deformities, no edema.  Skin: Intact, warm, no rashes.  LABS: PULMONARY  Recent Labs Lab 02/01/16 0406 02/02/16 0300 02/03/16 0535 02/04/16 0929 02/05/16 0100  PHART 7.399 7.463* 7.379 7.334* 7.420  PCO2ART 29.8* 25.5* 35.1 30.6* 35.5  PO2ART 107.0* 122* 65.8* 95.0 71.8*  HCO3 18.5* 18.2* 20.3 16.0* 22.6  TCO2 19 19.0 21.4 17 23.6  O2SAT 98.0 98.9 90.8 96.0 93.9    CBC  Recent Labs Lab 02/04/16 0430 02/05/16 0400 02/06/16 0336  HGB 12.4* 13.1 12.6*  HCT 38.9* 39.3 39.5  WBC 7.8 14.5* 12.0*  PLT 100* 131* 125*    COAGULATION  Recent Labs Lab 01/31/16 0300  INR 1.27    CARDIAC    Recent Labs Lab 01/31/16 0741 01/31/16 1416 02/04/16 0430 02/05/16 0400  TROPONINI 0.75* 17.54* 2.37* 1.61*   No results for input(s): PROBNP in the last 168 hours.   CHEMISTRY  Recent Labs Lab 02/02/16 0332 02/03/16 0418 02/04/16 0430 02/05/16 0400 02/06/16 0336  NA 143 143 143 144 144  K 3.8 3.5 3.8 3.7 3.0*  CL 112* 110 111 109 103  CO2 19* 22 21* 25 28  GLUCOSE 100* 118* 143* 148* 112*  BUN 30* 27* 26* 26* 24*  CREATININE 1.72* 1.66* 1.58* 1.43* 1.56*  CALCIUM 8.3* 8.1* 8.6* 8.6* 8.9  MG 2.3 1.9 2.3 1.8 1.7  PHOS 2.7 2.8 2.7 3.6 3.1   Estimated Creatinine Clearance: 56.2 mL/min (by C-G formula based on Cr of 1.56).   LIVER  Recent Labs Lab 01/31/16 0300  AST 41  ALT 30  ALKPHOS 124  BILITOT 1.3*  PROT 8.8*  ALBUMIN 4.6  INR 1.27     INFECTIOUS  Recent Labs Lab 01/31/16 0549 01/31/16 0742 01/31/16 1138  LATICACIDVEN 7.1* 5.2* 3.7*     ENDOCRINE CBG (last 3)   Recent Labs  02/05/16 2354 02/06/16 0335 02/06/16 0853  GLUCAP 135* 112* 129*          IMAGING x48h  - image(s) personally visualized  -   highlighted in bold Ct Head Wo Contrast  02/04/2016  CLINICAL DATA:  Small subacute infarct right occipital region. Blood pressure instability. EXAM: CT HEAD WITHOUT CONTRAST TECHNIQUE: Contiguous axial images were obtained from the base of the skull through the vertex without intravenous contrast. COMPARISON:  01/31/2016 and 08/23/2015 as well as MRI 01/31/2016 FINDINGS: Postsurgical changes of the occipital skull. Ventricles, cisterns and other CSF spaces are within normal. Evidence of small old right occipital/posterior temporal infarct. Mild chronic ischemic microvascular disease is present. No evidence of mass, mass effect, shift of midline structures or acute hemorrhage. No definite acute right MCA territory infarct as suggested on recent MRI. Remainder the exam is unchanged. IMPRESSION: No acute intracranial findings. Chronic ischemic microvascular disease. Small old right occipital/ posterior temporal infarct. Electronically Signed   By: Elberta Fortis M.D.   On: 02/04/2016 11:23   Dg Chest Port 1 View  02/05/2016  CLINICAL DATA:  Respiratory failure, NSTEMI, CHF. EXAM: PORTABLE CHEST 1 VIEW COMPARISON:  Portable chest x-ray of Feb 04, 2016 FINDINGS: There is been interval extubation of the trachea and of the esophagus. The left lung is well-expanded. The right hemidiaphragm remains elevated. The pulmonary interstitial markings have improved especially on the right. The cardiac silhouette remains enlarged. The central pulmonary vascularity is mildly engorged. The right-sided PICC line tip projects over the midportion of the SVC. The bony thorax is unremarkable. IMPRESSION: Improving CHF. Persistent elevation of the right hemidiaphragm. Probable small right pleural effusion and minimal subsegmental atelectasis at the right lung base. Electronically Signed   By: David  [place] M.D.   On: 02/05/2016 07:18   Dg Chest Port 1  View  02/04/2016  CLINICAL DATA:  Tube placement. EXAM: PORTABLE CHEST 1 VIEW COMPARISON:  02/04/2016 and 02/03/2016 FINDINGS: Patient rotated to the left. Endotracheal tube has tip 5 cm above the carina. Enteric tube courses into the region of the stomach and off the inferior portion of the film as tip is not visualized. Interval placement of right-sided PICC line with tip at the cavoatrial junction. Lungs are adequately inflated with persistent hazy opacification over the central right lung which may be due to asymmetric edema versus infection. Small amount right pleural fluid. Hazy left retrocardiac opacification unchanged likely effusion with atelectasis versus infection. Stable cardiomegaly. Remainder the exam is unchanged. IMPRESSION: Persistent hazy right central lung opacification suggesting asymmetric edema versus infection. Stable small right effusion. Stable left retrocardiac opacification likely small effusion with atelectasis although cannot exclude infection. Stable cardiomegaly. Tubes and lines as described. Electronically Signed   By: Elberta Fortis M.D.  On: 02/04/2016 15:16        DISCUSSION: 65 y.o. M with hx ETOH and cocaine abuse, admitted 05/10 with status epilepticus and possible right occipital CVA.  He required intubation in ED and PCCM was called for admission.  ASSESSMENT / PLAN:  NEUROLOGIC A:   Acute metabolic encephalopathy - resolved 05/15. Status epilepticus, no seizure on continuous EEG. Subacute CVA. ETOH use. Polysubstance use - UDS in past positive for cocaine and THC.  This admit positive for Marijuana. P:   Neuro following. Continue antiepileptics per neuro. On keppra.  Continue Thiamine / Folate. Substance abuse counseling once extubated.  PULMONARY A: VDRF - due to inability to protect airway in the setting of status epilepticus.  Self extubated 05/14 and done well since. Concern for aspiration - per EDP, pt had frothy secretions during  intubation. Tobacco dependence. P:   Continue supplemental O2 as needed to maintain SpO2 > 92%. Pulmonary hygiene. Tobacco cessation counseling. On unasyn. Will d/c abx.  Clinically improved.   CARDIOVASCULAR A:  Hypertensive emergency. A.fib with RVR - on eliquis. Hx HTN, CHF (EF 55-60% in Dec 2016). NSTEMI - latest cards note 02/01/16 - not a interventional candidate. ECHO 02/01/16 with EF 35% P:  On cardizem drip > transition to PO On metoprolol PO Switch heparin to eliquis. D/c IV lasix. Start lasix po 20 daily > home dose.    RENAL A:   AKI on CKD - improving. P:   Continue lasix 20 mg PO daily unless cards thinks he needs more diuresis Replace electrolytes as indicated.  GASTROINTESTINAL A:   Hx HCV. GI prophylaxis. On PO diet  P:   SUP: Pantoprazole. Cont PO diet  HEMATOLOGIC A:   Thrombocytopenia - presumed due to bone marrow suppression due to ETOH. On chronic anticoagulation due to AFRVR. VTE Prophylaxis. P:  Monitor platelet counts. Switch heparin drip to eliquis   INFECTIOUS A:   Concern for aspiration - per EDP, pt had frothy secretions during intubation. P:   Will d/c unasyn.   ENDOCRINE A:   Hyperglycemia - no hx DM. P:   SSI. Assess Hgb A1c.   Family updated: No family bedside 5/162017.  Interdisciplinary Family Meeting v Palliative Care Meeting:  Due by: 02/06/16.  Plan to transfer to SDU this pm and transfer to [facility].   Addendum 5/16 312pm > pt doing well on PO meds. cardizem drip has been dc'd. Transfer to SDU. Pt will be under TH in am, PCCM will sign off then.    Pollie Meyer, MD 02/06/2016, 10:36 AM [facility] Pager [phone] After 3 pm or if no answer, call [phone]

## 2016-02-06 NOTE — Progress Notes (Addendum)
ANTICOAGULATION CONSULT NOTE - Follow Up Consult  Pharmacy Consult:  Heparin Indication: atrial fibrillation  No Known Allergies  Patient Measurements: Height: 5\' 11"  (180.3 cm) Weight: 208 lb 12.4 oz (94.7 kg) IBW/kg (Calculated) : 75.3  Heparin dosing weight = 96 kg  Vital Signs: Temp: 98 F (36.7 C) (05/16 0400) Temp Source: Oral (05/16 0400) BP: 152/87 mmHg (05/16 0545) Pulse Rate: 84 (05/16 0545)  Labs:  Recent Labs  02/04/16 0430  02/04/16 1515 02/05/16 0400 02/06/16 0335 02/06/16 0336  HGB 12.4*  --   --  13.1  --  12.6*  HCT 38.9*  --   --  39.3  --  39.5  PLT 100*  --   --  131*  --  125*  HEPARINUNFRC  --   < > 0.36 0.36 0.35  --   CREATININE 1.58*  --   --  1.43*  --  1.56*  TROPONINI 2.37*  --   --  1.61*  --   --   < > = values in this interval not displayed.  Estimated Creatinine Clearance: 56.2 mL/min (by C-G formula based on Cr of 1.56).    Assessment: 65 YOM with acute CVA to continue on IV heparin for history of Afib while Eliquis is on hold. Heparin level is therapeutic 0.35; no bleeding reported and patient's CBC is improving.   Goal of Therapy:  Heparin level 0.3-0.5 units/ml d/t acute CVA Monitor platelets by anticoagulation protocol: Yes    Plan:  - Continue heparin gtt at 1350 units/hr - Daily HL / CBC - Monitor s/sx bleeding - Hold Eliquis  Babs Bertin, PharmD, BCPS Clinical Pharmacist Pager [phone] 02/06/2016 7:55 AM   ADDENDUM:  Rx consulted to transition back from heparin IV to Eliquis. Communicated w/ RN to d/c heparin at time of 1st dose of Eliquis. Wt > 60kg, Scr 1.56, age 28.  Plan: Heparin IV >> Eliquis 5mg  bid Monitor CBC, s/sx bleeding   Babs Bertin, PharmD, [facility] Clinical Pharmacist Pager [phone] 02/06/2016 10:41 AM

## 2016-02-06 NOTE — Evaluation (Signed)
Physical Therapy Evaluation Patient Details Name: Kevin [name] MRN: [id number] DOB: May 26, 1951 Today's Date: 02/06/2016   History of Present Illness  65 y.o. M with hx ETOH and cocaine abuse, admitted 05/10 with status epilepticus and possible right occipital CVA. He required intubation in ED  Clinical Impression  Patient demonstrates deficits in functional mobility as indicated below. Will need continued skilled PT to address deficits and maximize function. Will see as indicated and progress as tolerated. OF NOTE: patient with some instability and impulsivity.      Follow Up Recommendations Home health PT;Supervision/Assistance - 24 hour    Equipment Recommendations   (TBD possibly RW)    Recommendations for Other Services       Precautions / Restrictions Precautions Precautions: Fall Restrictions Weight Bearing Restrictions: No      Mobility  Bed Mobility Overal bed mobility: Needs Assistance Bed Mobility: Supine to Sit     Supine to sit: Min guard     General bed mobility comments: min guard for safety dur to impulsivity  Transfers Overall transfer level: Needs assistance Equipment used: 1 person hand held assist Transfers: Sit to/from Stand Sit to Stand: Min guard         General transfer comment: min guard for safety, no physical assist required  Ambulation/Gait Ambulation/Gait assistance: Min assist Ambulation Distance (Feet): 180 Feet Assistive device: 1 person hand held assist Gait Pattern/deviations: Step-through pattern;Decreased stride length;Drifts right/left;Narrow base of support Gait velocity: decreased Gait velocity interpretation: Below normal speed for age/gender General Gait Details: patient with some instability noted during ambulation, over compensating by altering gait "to entertain" the nurses. Patient stating he is dancing. Min assist to prevent complete LOB.  Stairs            Wheelchair Mobility    Modified Rankin  (Stroke Patients Only)       Balance Overall balance assessment: Needs assistance   Sitting balance-Leahy Scale: Good       Standing balance-Leahy Scale: Fair Standing balance comment: increased lateral sway in static standing             High level balance activites: Side stepping;Backward walking;Direction changes;Turns;Head turns High Level Balance Comments: min guard to min assist with higher level balance tasks             Pertinent Vitals/Pain Pain Assessment: No/denies pain    Home Living Family/patient expects to be discharged to:: Private residence Living Arrangements: Spouse/significant other Available Help at Discharge: Family Type of Home: Apartment Home Access: Stairs to enter   Secretary/administrator of Steps: 12 Home Layout: Two level;1/2 bath on main level;Bed/bath upstairs Home Equipment: None      Prior Function Level of Independence: Independent         Comments: driving     Hand Dominance   Dominant Hand: Left    Extremity/Trunk Assessment   Upper Extremity Assessment: Overall WFL for tasks assessed           Lower Extremity Assessment: Generalized weakness         Communication   Communication: No difficulties  Cognition Arousal/Alertness: Awake/alert Behavior During Therapy: Impulsive Overall Cognitive Status: No family/caregiver present to determine baseline cognitive functioning                      General Comments      Exercises        Assessment/Plan    PT Assessment Patient needs continued PT services  PT Diagnosis Difficulty walking;Abnormality  of gait;Generalized weakness   PT Problem List Decreased strength;Decreased activity tolerance;Decreased balance;Decreased mobility;Decreased coordination;Decreased cognition  PT Treatment Interventions DME instruction;Gait training;Stair training;Functional mobility training;Therapeutic activities;Therapeutic exercise;Balance training;Patient/family  education   PT Goals (Current goals can be found in the Care Plan section) Acute Rehab PT Goals Patient Stated Goal: to go home PT Goal Formulation: With patient Time For Goal Achievement: 02/20/16 Potential to Achieve Goals: Good    Frequency Min 3X/week   Barriers to discharge Inaccessible home environment flight of stairs to enter apt    Co-evaluation               End of Session Equipment Utilized During Treatment: Gait belt Activity Tolerance: Patient tolerated treatment well Patient left: in chair;with call bell/phone within reach Nurse Communication: Mobility status         Time: 9604-5409 PT Time Calculation (min) (ACUTE ONLY): 25 min   Charges:   PT Evaluation $PT Eval Moderate Complexity: 1 Procedure PT Treatments $Gait Training: 8-22 mins   PT G CodesFabio [name] Feb 17, 2016, 4:16 PM  Charlotte Crumb, PT DPT  [phone]

## 2016-02-06 NOTE — Progress Notes (Signed)
Speech Language Pathology Treatment: Dysphagia  Patient Details Name: Kevin [name] MRN: [id number] DOB: [birth year] Today's Date: 02/06/2016 Time: 6429-0379 SLP Time Calculation (min) (ACUTE ONLY): 9 min  Assessment / Plan / Recommendation Clinical Impression  Pt denies dysphagia since diet/liquids initiated yesterday. No indications of oral or pharyngeal difficulties and no cues needed during observation today. Continue regular texture/thin, pills with water, d/c ST.   HPI HPI: 65 y.o. M with hx ETOH and cocaine abuse, admitted 05/10 with status epilepticus and possible right occipital CVA. Intubated 5/10, self-extubated 5/14.       SLP Plan  Discharge SLP treatment due to (comment);All goals met     Recommendations  Diet recommendations: Regular;Thin liquid Liquids provided via: Cup;Straw Medication Administration: Whole meds with liquid Supervision: Patient able to self feed Compensations: Slow rate;Small sips/bites Postural Changes and/or Swallow Maneuvers: Out of bed for meals             Oral Care Recommendations: Oral care BID Follow up Recommendations: None Plan: Discharge SLP treatment due to (comment);All goals met     GO                Kevin [name] 02/06/2016, 10:26 AM  [name].Ed [employer] [phone]

## 2016-02-07 DIAGNOSIS — I10 Essential (primary) hypertension: Secondary | ICD-10-CM | POA: Diagnosis present

## 2016-02-07 DIAGNOSIS — G9341 Metabolic encephalopathy: Secondary | ICD-10-CM | POA: Diagnosis present

## 2016-02-07 DIAGNOSIS — I5022 Chronic systolic (congestive) heart failure: Secondary | ICD-10-CM

## 2016-02-07 DIAGNOSIS — N189 Chronic kidney disease, unspecified: Secondary | ICD-10-CM

## 2016-02-07 DIAGNOSIS — F1910 Other psychoactive substance abuse, uncomplicated: Secondary | ICD-10-CM

## 2016-02-07 DIAGNOSIS — I480 Paroxysmal atrial fibrillation: Secondary | ICD-10-CM

## 2016-02-07 DIAGNOSIS — N179 Acute kidney failure, unspecified: Secondary | ICD-10-CM

## 2016-02-07 DIAGNOSIS — R768 Other specified abnormal immunological findings in serum: Secondary | ICD-10-CM | POA: Diagnosis present

## 2016-02-07 DIAGNOSIS — R894 Abnormal immunological findings in specimens from other organs, systems and tissues: Secondary | ICD-10-CM

## 2016-02-07 DIAGNOSIS — E876 Hypokalemia: Secondary | ICD-10-CM

## 2016-02-07 LAB — CBC WITH DIFFERENTIAL/PLATELET
Basophils Absolute: 0 10*3/uL (ref 0.0–0.1)
Basophils Relative: 0 %
Eosinophils Absolute: 0.1 10*3/uL (ref 0.0–0.7)
Eosinophils Relative: 1 %
HCT: 37.5 % — ABNORMAL LOW (ref 39.0–52.0)
Hemoglobin: 12.2 g/dL — ABNORMAL LOW (ref 13.0–17.0)
Lymphocytes Relative: 5 %
Lymphs Abs: 0.7 10*3/uL (ref 0.7–4.0)
MCH: 33 pg (ref 26.0–34.0)
MCHC: 32.5 g/dL (ref 30.0–36.0)
MCV: 101.4 fL — ABNORMAL HIGH (ref 78.0–100.0)
Monocytes Absolute: 1.1 10*3/uL — ABNORMAL HIGH (ref 0.1–1.0)
Monocytes Relative: 8 %
Neutro Abs: 11.5 10*3/uL — ABNORMAL HIGH (ref 1.7–7.7)
Neutrophils Relative %: 86 %
Platelets: 138 10*3/uL — ABNORMAL LOW (ref 150–400)
RBC: 3.7 MIL/uL — ABNORMAL LOW (ref 4.22–5.81)
RDW: 12.8 % (ref 11.5–15.5)
WBC: 13.3 10*3/uL — ABNORMAL HIGH (ref 4.0–10.5)

## 2016-02-07 LAB — BASIC METABOLIC PANEL
Anion gap: 12 (ref 5–15)
BUN: 22 mg/dL — ABNORMAL HIGH (ref 6–20)
CO2: 29 mmol/L (ref 22–32)
Calcium: 8.7 mg/dL — ABNORMAL LOW (ref 8.9–10.3)
Chloride: 101 mmol/L (ref 101–111)
Creatinine, Ser: 1.47 mg/dL — ABNORMAL HIGH (ref 0.61–1.24)
GFR calc Af Amer: 56 mL/min — ABNORMAL LOW (ref >60)
GFR calc non Af Amer: 49 mL/min — ABNORMAL LOW (ref >60)
Glucose, Bld: 102 mg/dL — ABNORMAL HIGH (ref 65–99)
Potassium: 3.1 mmol/L — ABNORMAL LOW (ref 3.5–5.1)
Sodium: 142 mmol/L (ref 135–145)

## 2016-02-07 LAB — PHOSPHORUS: Phosphorus: 3.3 mg/dL (ref 2.5–4.6)

## 2016-02-07 LAB — MAGNESIUM: Magnesium: 2 mg/dL (ref 1.7–2.4)

## 2016-02-07 MED ORDER — LEVETIRACETAM 500 MG PO TABS
1000.0000 mg | ORAL_TABLET | Freq: Two times a day (BID) | ORAL | Status: DC
  Administered 2016-02-07 – 2016-02-09 (×4): 1000 mg via ORAL
  Filled 2016-02-07 (×4): qty 2

## 2016-02-07 MED ORDER — PANTOPRAZOLE SODIUM 40 MG PO TBEC
40.0000 mg | DELAYED_RELEASE_TABLET | Freq: Every day | ORAL | Status: DC
  Administered 2016-02-07 – 2016-02-08 (×2): 40 mg via ORAL
  Filled 2016-02-07 (×2): qty 1

## 2016-02-07 MED ORDER — METOPROLOL TARTRATE 50 MG PO TABS
50.0000 mg | ORAL_TABLET | Freq: Two times a day (BID) | ORAL | Status: DC
  Administered 2016-02-07 – 2016-02-08 (×2): 50 mg via ORAL
  Filled 2016-02-07 (×2): qty 1

## 2016-02-07 MED ORDER — FOLIC ACID 1 MG PO TABS
1.0000 mg | ORAL_TABLET | Freq: Every day | ORAL | Status: DC
  Administered 2016-02-07 – 2016-02-09 (×3): 1 mg via ORAL
  Filled 2016-02-07 (×3): qty 1

## 2016-02-07 MED ORDER — POTASSIUM CHLORIDE CRYS ER 20 MEQ PO TBCR
50.0000 meq | EXTENDED_RELEASE_TABLET | Freq: Once | ORAL | Status: AC
  Administered 2016-02-07: 50 meq via ORAL
  Filled 2016-02-07: qty 2

## 2016-02-07 MED ORDER — VITAMIN B-1 100 MG PO TABS
100.0000 mg | ORAL_TABLET | Freq: Every day | ORAL | Status: DC
  Administered 2016-02-07 – 2016-02-09 (×3): 100 mg via ORAL
  Filled 2016-02-07 (×3): qty 1

## 2016-02-07 NOTE — Progress Notes (Signed)
Physical Therapy Treatment Patient Details Name: Kevin [name] MRN: [id number] DOB: [birth year] Today's Date: 03-01-16    History of Present Illness 65 y.o. M with hx ETOH and cocaine abuse, admitted 05/10 with status epilepticus and possible right occipital CVA. He required intubation in ED    PT Comments    Patient progressing with balance and independence.  Will attempt with cane next session as pt feels might be his choice of devices to use.  Follow up HHPT indicated and pt interested in HHSW and HHRN.  Follow Up Recommendations  Home health PT;Supervision/Assistance - 24 hour     Equipment Recommendations  Other (comment) (will try cane next session)    Recommendations for Other Services       Precautions / Restrictions Precautions Precautions: Fall    Mobility  Bed Mobility         Supine to sit: Supervision     General bed mobility comments: use of rail  Transfers Overall transfer level: Needs assistance Equipment used: None Transfers: Sit to/from Stand Sit to Stand: Supervision         General transfer comment: assist for safety due to multiple lines  Ambulation/Gait Ambulation/Gait assistance: Min guard Ambulation Distance (Feet): 300 Feet Assistive device: None Gait Pattern/deviations: Step-through pattern;Decreased stride length;Wide base of support     General Gait Details: keeps trunk posteriorly rotated on R, occasional scissoring and needing assist for balance/safety   Stairs            Wheelchair Mobility    Modified Rankin (Stroke Patients Only)       Balance Overall balance assessment: Needs assistance           Standing balance-Leahy Scale: Fair Standing balance comment: keeps feet wide apart               High Level Balance Comments: side stepping at rail, tandem gait with rail and HHA    Cognition Arousal/Alertness: Awake/alert Behavior During Therapy: WFL for tasks assessed/performed Overall  Cognitive Status: Within Functional Limits for tasks assessed                      Exercises      General Comments        Pertinent Vitals/Pain Pain Assessment: No/denies pain    Home Living                      Prior Function            PT Goals (current goals can now be found in the care plan section) Progress towards PT goals: Progressing toward goals    Frequency  Min 3X/week    PT Plan Current plan remains appropriate    Co-evaluation             End of Session Equipment Utilized During Treatment: Gait belt Activity Tolerance: Patient tolerated treatment well Patient left: in bed;with call bell/phone within reach     Time: 1430-1448 PT Time Calculation (min) (ACUTE ONLY): 18 min  Charges:  $Gait Training: 8-22 mins                    G Codes:      Elray Mcgregor 2016/03/01, 3:45 PM  Sheran Lawless, PT [phone] 03/01/16

## 2016-02-07 NOTE — Progress Notes (Signed)
     SUBJECTIVE: No chest pain or dyspnea.   Tele: atrial fib, rate 90s  BP 111/91 mmHg  Pulse 99  Temp(Src) 98.3 F (36.8 C) (Oral)  Resp 32  Ht 5\' 11"  (1.803 m)  Wt 209 lb 7 oz (95 kg)  BMI 29.22 kg/m2  SpO2 94%  Intake/Output Summary (Last 24 hours) at 02/07/16 1026 Last data filed at 02/06/16 2300  Gross per 24 hour  Intake 234.98 ml  Output    650 ml  Net -415.02 ml    PHYSICAL EXAM General: Well developed, well nourished, in no acute distress. Alert and oriented x 3.  Psych:  Good affect, responds appropriately Neck: No JVD. No masses noted.  Lungs: Clear bilaterally with no wheezes or rhonci noted.  Heart: irreg irreg with no murmurs noted. Abdomen: Bowel sounds are present. Soft, non-tender.  Extremities: No lower extremity edema.   LABS: Basic Metabolic Panel:  Recent Labs  55/73/22 0336 02/07/16 0415  NA 144 142  K 3.0* 3.1*  CL 103 101  CO2 28 29  GLUCOSE 112* 102*  BUN 24* 22*  CREATININE 1.56* 1.47*  CALCIUM 8.9 8.7*  MG 1.7 2.0  PHOS 3.1 3.3   CBC:  Recent Labs  02/06/16 0336 02/07/16 0415  WBC 12.0* 13.3*  NEUTROABS 10.1* 11.5*  HGB 12.6* 12.2*  HCT 39.5 37.5*  MCV 102.1* 101.4*  PLT 125* 138*    Current Meds: . apixaban  5 mg Oral BID  . aspirin  81 mg Oral Daily  . diltiazem  240 mg Oral Daily  . folic acid  1 mg Oral Daily  . furosemide  20 mg Oral Daily  . levETIRAcetam  1,000 mg Intravenous Q12H  . metoprolol tartrate  25 mg Oral BID  . pantoprazole  40 mg Oral QHS  . rosuvastatin  20 mg Oral q1800  . sodium chloride flush  10-40 mL Intracatheter Q12H  . thiamine  100 mg Oral Daily     ASSESSMENT AND PLAN: Pt admitted with seizure, atrial fib with RVR, respiratory failure.   1. CAD/elevated troponin: Elevated troponin in setting of stroke, atrial fib with RVR. Last cath in January 2016 and found to have severe stenosis in the moderate caliber Ramus Intermediate branch and moderate stenosis in the small distal RCA.  He was not felt to be a candidate for coronary stenting at that time due to his history of medication non-compliance and poly-substance abuse. At this time, he is having no chest pain. Troponin has trended down. Would favor conservative approach from cardiac standpoint. Would continue ASA 81 mg daily. Would not restart Plavix since he is on Eliquis. Continue beta blocker and statin.   2. Atrial fib with RVR: chronic, persistent atrial fib. Rate controlled on po Cardizem, po cardizem. He is now anti-coagulated on Eliquis.   Will sign off. Please call with questions.     Verne Carrow  5/17/201710:26 AM

## 2016-02-07 NOTE — Progress Notes (Signed)
Patient had 9-beat run of VT. Patient asymptomatic. Merdis Delay, NP notified.

## 2016-02-07 NOTE — Care Management Note (Addendum)
Case Management Note  Patient Details  Name: JYRON STUEWE MRN: [id number] Date of Birth: 1951/04/16  Subjective/Objective:     NCM spoke with patient and wife , Stefano Gaul [phone] with patient permission. Patient lives with wife who works he has a bsc at home, per pt eval rec HHPT with 24 hrs,wife states she will check with son to see if he could be with patient while she is at work .  NCM left agency list with wife for them to look over to choose agency for HHPT, will check back with them tomorrow.  Patient states he will need a cane also.        5/18- NCM spoke with wife, she states they have chosen [facility], informed her that [name] is not in network with [employer], informed her that [facility] may be in network and will do a benefits check to see who else is in network and will let her know.  Awaiting benefit check.  Also NCM received call from Peggy at [facility] [phone], returned her call and left vm with my phone number for call back.  NCM contacted [facility] , spoke with [facility] regarding HHPT/HHOT, intake number [id number], then spoke with Kerrie Pleasure regarding cane for patient, he told me to fax to [employer] at  [phone] for the cane, it was faxed. NCM informed wife of all this information and gave her the phone number for [facility].  They will contact patient in 24-48 hrs with agency who will be working with them for HHPT/HHOT.  NCM informed wife that patient may be dc today, not sure , up to MD.  NCM left private duty list in patient 's room for wife.  She states she will be going home to get patient's clothes and straighten up a bit and then she will be back in case MD dc patient.  NCM gave wife all phone numbers for [facility] to follow up with and intake number.  NCM spoke with MD regarding patient, due to his elevated bp's and chf he will not be dc today, will most likely transfer out of unit.  NCM informed wife that patient will not be dc today per MD.       Action/Plan:   Expected  Discharge Date:                  Expected Discharge Plan:  Home w Home Health Services  In-House Referral:     Discharge planning Services  CM Consult  Post Acute Care Choice:    Choice offered to:     DME Arranged:    DME Agency:     HH Arranged:    HH Agency:     Status of Service:  In process, will continue to follow  Medicare Important Message Given:    Date Medicare IM Given:    Medicare IM give by:    Date Additional Medicare IM Given:    Additional Medicare Important Message give by:     If discussed at Long Length of Stay Meetings, dates discussed:    Additional Comments:  Leone Haven, RN 02/07/2016, 5:02 PM

## 2016-02-07 NOTE — Progress Notes (Signed)
PROGRESS NOTE    KEYON LILLER  HYQ:[id number] DOB: [birth year] DOA: 01/31/2016 PCP: Jackie Plum, MD   Brief Narrative:  65 y.o. male with PMHx Cocaine abuse; ETOH abuse; Marijuana abuse, Tobacco abuse, Dysrhythmia; Chronic Systolic CHF, Persistent atrial fibrillation (on Eliquis), Hepatitis C antibody test positive   Early AM 01/31/16. Wife states he went to bed and was last seen normal around 1am. At 2am, he was vomiting and had eye deviation to the right. EMS was summoned and pt was brought to [facility] ED for further evaluation. He was initially brought in as code stroke.  On ED arrival, pt began to have grand mal seizure. He received 2mg  ativan followed by 2nd dose of 2mg . Neurology was at bedside and ordered an 1g Keppra load. Pt was then intubated for airway protection. Prior to intubation, he had AFRVR with HR as high as 170's. After intubation and propofol, HR decreased to 150's.  CT of the head revealed equivocal subacute right occipital infarction but was otherwise unremarkable.  PCCM was called for admission.   Assessment & Plan:   Principal Problem:   Status epilepticus (HCC) Active Problems:   PAD (peripheral artery disease), PTA to Lt. SFA in 2009, total occlusion mid Rt SFA with collaterals    HTN (hypertension) -Accelerated with End organ involvement   ETOH abuse   NSVT (nonsustained ventricular tachycardia) (HCC)   Thrombocytopenia on admission 04/30/12, thought to be secondary to Xarelto, Platelets no better 2015   Hepatitis C antibody test positive   H/O medication noncompliance   Seizures (HCC)   CAD in native artery cath 2016 90% mRamus    Atrial fibrillation, permanent (HCC)   NSTEMI (non-ST elevated myocardial infarction) (HCC)   Acute respiratory failure with hypoxia (HCC)   Acute encephalopathy   Blood pressure instability   Secondary hypertension, unspecified   Elevated troponin   Metabolic encephalopathy   Chronic systolic CHF (congestive  heart failure) (HCC)   Paroxysmal atrial fibrillation (HCC)   Essential hypertension   HCV antibody positive   Acute on chronic renal failure (HCC)   Hypokalemia   Substance abuse -5/10 UDS positive marijuana  Acute metabolic encephalopathy  -Most likely multifactorial to include polysubstance abuse, chronic systolic CHF, and Status epilepticus. -no seizure on continuous EEG. -Patient and wife counseled extensively on patient's sequela if he continues polysubstance abuse, alcohol abuse, tobacco abuse to include death.  Status epilepticus/Seizures  -Continue Keppra 1000 mg  BID -Patient and wife counseled that patient will not be able to drive until cleared by neurology; 3-6 months?  NSTEMI/Chronic systolic CHF  -Patient and wife both state have never been informed of CHF.  -Consult CHF clinic in [place] patient will need follow-up and cardiac rehabilitation  -Increase metoprolol to 50 mg BID -Diltiazem 240 mg daily -Hold on ACEI/ARB secondary to renal failure -Strict in and out since admission +4.4 L -Daily weight Filed Weights   02/05/16 0410 02/06/16 0500 02/07/16 0412  Weight: 100 kg (220 lb 7.4 oz) 94.7 kg (208 lb 12.4 oz) 95 kg (209 lb 7 oz)  -Per cardiology note from 5/10 patient's elevated troponin consistent with MI not a catheterization candidate. -Consulted CSW discuss medication options -Consulted nutrition;Discuss heart healthy diet. Wife would like to be present. -PT/OT; recommends home health  Atrial fibrillation -Currently rate controlled and in NSR -See CHF -Continue Eliquis 5 mg BID  HTN -See CHF   Acute on chronic renal failure   -Increase Lasix 40 mg daily Lab Results  Component Value Date  CREATININE 1.47* 02/07/2016   CREATININE 1.56* 02/06/2016   CREATININE 1.43* 02/05/2016  -Creatinine improving continue to follow closely   Hx HCV. -Unsure patient actively treated will follow-up with patient and wife in [place] Ever received treatment? -HCV  Quant pending -Obtain abdominal ultrasound; previous ultrasound 2013 possible cirrhosis  Thrombocytopenia - presumed due to bone marrow suppression due to ETOH. -Improving   Leukocytosis -Most likely reactive will monitor closely; if patient spikes fever will panculture.  Recent Labs Lab 02/07/16 0415  WBC 13.3*    Hyperglycemia  - no hx DM. -5/10 hemoglobin A1c= 4.9   Hypokalemia -Potassium goal> 4 -K Dur 50 mEq    DVT prophylaxis: Eliquis  Code Status: Full  Family Communication: Wife present  Disposition Plan: Incomplete diuresis, enroll in CHF clinic and cardiac rehabilitation   Consultants:  Dr.Wesam [facility] M   Dr.McNeill Windy Fast neurology Dr.Philip J Nahser [facility] MG   Procedures/Significant Events:  5/10 EEG; Clinical Interpretation: This EEG is most consistent with a sedated state. No seizure or seizure predisposition was recorded. Please note that a normal EEG does not preclude the possibility of epilepsy. 5/10 MRA/MRI head;MRI HEAD FINDINGS -Remote lacunar infarcts are present in the basal ganglia and right thalamus.  - Signal loss in the posterior right M2 segment suggests severe stenosis. -Significant attenuation of MCA branch vessels bilaterally. -Moderate narrowing is present in the distal P2 segments bilaterally. 5/11 echocardiogram- Left ventricle: moderate LVH. -LVEF= 35% to 40%. Diffuse hypokinesis. - Left atrium:  moderately dilated. 5/12 overnight EEG;This 23 hours of intensive EEG monitoring with simultaneous monitoring did not record any clinical or subclinical seizures.- Background activities were abnormal suggestive of severe encephalopathy of nonspecific etiologies including toxic metabolic or pharmacologic etiologies.  5/14 CT head;-Chronic ischemic microvascular disease. -Small old right occipital/ posterior temporal infarct.  Cultures 5/17 HCV RNA quant pending   Antimicrobials: NA   Devices NA   LINES / TUBES:   NA    Continuous Infusions:    Subjective: 5/17 A/O 4, NAD,  Objective: Filed Vitals:   02/07/16 1129 02/07/16 1132 02/07/16 1450 02/07/16 1500  BP: 75/63 161/94 164/94   Pulse: 88 86 84   Temp: 98 F (36.7 C)   98.4 F (36.9 C)  TempSrc: Oral   Oral  Resp: Height:      Weight:      SpO2: 98% 95% 97%     Intake/Output Summary (Last 24 hours) at 02/07/16 1841 Last data filed at 02/06/16 2300  Gross per 24 hour  Intake    110 ml  Output    300 ml  Net   -190 ml   Filed Weights   02/05/16 0410 02/06/16 0500 02/07/16 0412  Weight: 100 kg (220 lb 7.4 oz) 94.7 kg (208 lb 12.4 oz) 95 kg (209 lb 7 oz)    Examination:  General: A/O 4, NAD,No acute respiratory distress Eyes: negative scleral hemorrhage, negative anisocoria, negative icterus ENT: Negative Runny nose, negative gingival bleeding, Neck:  Negative scars, masses, torticollis, lymphadenopathy, JVD Lungs: Clear to auscultation bilaterally without wheezes or crackles Cardiovascular: Regular rate and rhythm without murmur gallop or rub normal S1 and S2 Abdomen: negative abdominal pain,  positive distention, positive soft, bowel sounds, no rebound, no ascites, no appreciable mass Extremities: No significant cyanosis, clubbing, or edema bilateral lower extremities Skin: Negative rashes, lesions, ulcers Psychiatric:  Negative depression, negative anxiety, negative fatigue, negative mania  Central nervous system:  Cranial nerves II through  XII intact, tongue/uvula midline, all extremities muscle strength 5/5, sensation intact throughout,  negative dysarthria, negative expressive aphasia, negative receptive aphasia.  .     Data Reviewed: Care during the described time interval was provided by me .  I have reviewed this patient's available data, including medical history, events of note, physical examination, and all test results as part of my evaluation. I have personally reviewed and interpreted all  radiology studies.  CBC:  Recent Labs Lab 02/04/16 0312 02/04/16 0430 02/05/16 0400 02/06/16 0336 02/07/16 0415  WBC 4.3 7.8 14.5* 12.0* 13.3*  NEUTROABS 3.4 6.3 12.6* 10.1* 11.5*  HGB 6.7* 12.4* 13.1 12.6* 12.2*  HCT 22.3* 38.9* 39.3 39.5 37.5*  MCV 104.2* 102.9* 101.8* 102.1* 101.4*  PLT 47* 100* 131* 125* 138*   Basic Metabolic Panel:  Recent Labs Lab 02/03/16 0418 02/04/16 0430 02/05/16 0400 02/06/16 0336 02/07/16 0415  NA 143 143 144 144 142  K 3.5 3.8 3.7 3.0* 3.1*  CL 110 111 109 103 101  CO2 22 21* 25 28 29   GLUCOSE 118* 143* 148* 112* 102*  BUN 27* 26* 26* 24* 22*  CREATININE 1.66* 1.58* 1.43* 1.56* 1.47*  CALCIUM 8.1* 8.6* 8.6* 8.9 8.7*  MG 1.9 2.3 1.8 1.7 2.0  PHOS 2.8 2.7 3.6 3.1 3.3   GFR: Estimated Creatinine Clearance: 59.7 mL/min (by C-G formula based on Cr of 1.47). Liver Function Tests: No results for input(s): AST, ALT, ALKPHOS, BILITOT, PROT, ALBUMIN in the last 168 hours. No results for input(s): LIPASE, AMYLASE in the last 168 hours. No results for input(s): AMMONIA in the last 168 hours. Coagulation Profile: No results for input(s): INR, PROTIME in the last 168 hours. Cardiac Enzymes:  Recent Labs Lab 02/04/16 0430 02/05/16 0400  TROPONINI 2.37* 1.61*   BNP (last 3 results) No results for input(s): PROBNP in the last 8760 hours. HbA1C: No results for input(s): HGBA1C in the last 72 hours. CBG:  Recent Labs Lab 02/05/16 1547 02/05/16 2014 02/05/16 2354 02/06/16 0335 02/06/16 0853  GLUCAP 108* 99 135* 112* 129*   Lipid Profile: No results for input(s): CHOL, HDL, LDLCALC, TRIG, CHOLHDL, LDLDIRECT in the last 72 hours. Thyroid Function Tests: No results for input(s): TSH, T4TOTAL, FREET4, T3FREE, THYROIDAB in the last 72 hours. Anemia Panel: No results for input(s): VITAMINB12, FOLATE, FERRITIN, TIBC, IRON, RETICCTPCT in the last 72 hours. Urine analysis:    Component Value Date/Time   COLORURINE AMBER* 01/31/2016 0651    APPEARANCEUR CLOUDY* 01/31/2016 0651   LABSPEC 1.016 01/31/2016 0651   PHURINE 5.5 01/31/2016 0651   GLUCOSEU 250* 01/31/2016 0651   HGBUR LARGE* 01/31/2016 0651   BILIRUBINUR NEGATIVE 01/31/2016 0651   KETONESUR NEGATIVE 01/31/2016 0651   PROTEINUR >300* 01/31/2016 0651   NITRITE NEGATIVE 01/31/2016 0651   LEUKOCYTESUR NEGATIVE 01/31/2016 0651   Sepsis Labs: @LABRCNTIP (procalcitonin:4,lacticidven:4)  ) Recent Results (from the past 240 hour(s))  Culture, blood (routine x 2)     Status: Abnormal   Collection Time: 01/31/16  5:49 AM  Result Value Ref Range Status   Specimen Description BLOOD LEFT ARM  Final   Special Requests BOTTLES DRAWN AEROBIC AND ANAEROBIC  Final   Culture  Setup Time   Final    GRAM POSITIVE COCCI IN CLUSTERS ANAEROBIC BOTTLE ONLY Organism ID to follow CRITICAL RESULT CALLED TO, READ BACK BY AND VERIFIED WITH: Edd Arbour D AT 1243 02/01/16 BY L BENFIELD    Culture (A)  Final    STAPHYLOCOCCUS SPECIES (COAGULASE NEGATIVE) THE  SIGNIFICANCE OF ISOLATING THIS ORGANISM FROM A SINGLE SET OF BLOOD CULTURES WHEN MULTIPLE SETS ARE DRAWN IS UNCERTAIN. PLEASE NOTIFY THE MICROBIOLOGY DEPARTMENT WITHIN ONE WEEK IF SPECIATION AND SENSITIVITIES ARE REQUIRED.    Report Status 02/03/2016 FINAL  Final  Blood Culture ID Panel (Reflexed)     Status: Abnormal   Collection Time: 01/31/16  5:49 AM  Result Value Ref Range Status   Enterococcus species NOT DETECTED NOT DETECTED Final   Vancomycin resistance NOT DETECTED NOT DETECTED Final   Listeria monocytogenes NOT DETECTED NOT DETECTED Final   Staphylococcus species DETECTED (A) NOT DETECTED Final    Comment: CRITICAL RESULT CALLED TO, READ BACK BY AND VERIFIED WITH: Veronia Beets AT 1243 02/01/16 BY L BENFIELD    Staphylococcus aureus NOT DETECTED NOT DETECTED Final   Methicillin resistance NOT DETECTED NOT DETECTED Final   Streptococcus species NOT DETECTED NOT DETECTED Final   Streptococcus agalactiae NOT  DETECTED NOT DETECTED Final   Streptococcus pneumoniae NOT DETECTED NOT DETECTED Final   Streptococcus pyogenes NOT DETECTED NOT DETECTED Final   Acinetobacter baumannii NOT DETECTED NOT DETECTED Final   Enterobacteriaceae species NOT DETECTED NOT DETECTED Final   Enterobacter cloacae complex NOT DETECTED NOT DETECTED Final   Escherichia coli NOT DETECTED NOT DETECTED Final   Klebsiella oxytoca NOT DETECTED NOT DETECTED Final   Klebsiella pneumoniae NOT DETECTED NOT DETECTED Final   Proteus species NOT DETECTED NOT DETECTED Final   Serratia marcescens NOT DETECTED NOT DETECTED Final   Carbapenem resistance NOT DETECTED NOT DETECTED Final   Haemophilus influenzae NOT DETECTED NOT DETECTED Final   Neisseria meningitidis NOT DETECTED NOT DETECTED Final   Pseudomonas aeruginosa NOT DETECTED NOT DETECTED Final   Candida albicans NOT DETECTED NOT DETECTED Final   Candida glabrata NOT DETECTED NOT DETECTED Final   Candida krusei NOT DETECTED NOT DETECTED Final   Candida parapsilosis NOT DETECTED NOT DETECTED Final   Candida tropicalis NOT DETECTED NOT DETECTED Final  Culture, blood (routine x 2)     Status: None   Collection Time: 01/31/16  5:54 AM  Result Value Ref Range Status   Specimen Description BLOOD LEFT ARM  Final   Special Requests BOTTLES DRAWN AEROBIC AND ANAEROBIC  Final   Culture NO GROWTH 5 DAYS  Final   Report Status 02/06/2016 FINAL  Final  MRSA PCR Screening     Status: None   Collection Time: 01/31/16  6:34 AM  Result Value Ref Range Status   MRSA by PCR NEGATIVE NEGATIVE Final    Comment:        The GeneXpert MRSA Assay (FDA approved for NASAL specimens only), is one component of a comprehensive MRSA colonization surveillance program. It is not intended to diagnose MRSA infection nor to guide or monitor treatment for MRSA infections.   Urine culture     Status: None   Collection Time: 01/31/16  6:51 AM  Result Value Ref Range Status   Specimen Description  URINE, CATHETERIZED  Final   Special Requests Normal  Final   Culture NO GROWTH  Final   Report Status 02/01/2016 FINAL  Final         Radiology Studies: No results found.      Scheduled Meds: . apixaban  5 mg Oral BID  . aspirin  81 mg Oral Daily  . diltiazem  240 mg Oral Daily  . folic acid  1 mg Oral Daily  . furosemide  20 mg Oral Daily  . levETIRAcetam  1,000 mg Oral BID  . metoprolol tartrate  50 mg Oral BID  . pantoprazole  40 mg Oral QHS  . rosuvastatin  20 mg Oral q1800  . sodium chloride flush  10-40 mL Intracatheter Q12H  . thiamine  100 mg Oral Daily   Continuous Infusions:    LOS: 7 days    Time spent: 40 minutes    Angla Delahunt, Roselind Messier, MD [facility] Pager [phone]   If 7PM-7AM, please contact night-coverage [email or web address] Password [facility] 02/07/2016, 6:41 PM

## 2016-02-07 NOTE — Progress Notes (Signed)
Occupational Therapy Evaluation Patient Details Name: Kevin [name] MRN: [id number] DOB: Oct 10, 1950 Today's Date: 02/07/2016    History of Present Illness 65 y.o. M with hx ETOH and cocaine abuse, admitted 05/10 with status epilepticus and possible right occipital CVA. He required intubation in ED   Clinical Impression   PTA, pt independent with ADL and mobility and drove. Pt currently requires min A for mobility and ADL. Unsteady at times during gait.  Will follow acutely to maximize functional level of independence and further assess vision and cognition in order to facilitate safe D/C home with home health services.     Follow Up Recommendations  Home health OT;Supervision - Intermittent    Equipment Recommendations  None recommended by OT    Recommendations for Other Services       Precautions / Restrictions Precautions Precautions: Fall      Mobility Bed Mobility Overal bed mobility: Needs Assistance Bed Mobility: Supine to Sit;Sit to Supine     Supine to sit: Supervision Sit to supine: Supervision   General bed mobility comments: use of rail  Transfers Overall transfer level: Needs assistance Equipment used: None Transfers: Sit to/from Stand Sit to Stand: Supervision         General transfer comment: assist for safety due to multiple lines    Balance Overall balance assessment: Needs assistance   Sitting balance-Leahy Scale: Good       Standing balance-Leahy Scale: Fair Standing balance comment: keeps feet wide apart               High Level Balance Comments: side stepping at rail, tandem gait with rail and HHA            ADL Overall ADL's : Needs assistance/impaired     Grooming: Set up;Sitting   Upper Body Bathing: Set up;Sitting   Lower Body Bathing: Sit to/from stand;Min guard   Upper Body Dressing : Set up;Sitting   Lower Body Dressing: Min guard;Sit to/from stand   Toilet Transfer: Ambulation;Min guard    Toileting- Architect and Hygiene: Supervision/safety       Functional mobility during ADLs: Min guard (instability at times)       Vision Additional Comments: Will further assess. Noted word substitutions during reading; wore glasses; does not report changes. Need to further assess visual fields.    Perception     Praxis Praxis Praxis tested?: Within functional limits    Pertinent Vitals/Pain Pain Assessment: No/denies pain     Hand Dominance Left   Extremity/Trunk Assessment Upper Extremity Assessment Upper Extremity Assessment: Overall WFL for tasks assessed   Lower Extremity Assessment Lower Extremity Assessment: Generalized weakness   Cervical / Trunk Assessment Cervical / Trunk Assessment: Normal   Communication Communication Communication: No difficulties   Cognition Arousal/Alertness: Awake/alert Behavior During Therapy: WFL for tasks assessed/performed Overall Cognitive Status: No family/caregiver present to determine baseline cognitive functioning (will further assess in funcitonal context)                     General Comments       Exercises       Shoulder Instructions      Home Living Family/patient expects to be discharged to:: Private residence Living Arrangements: Spouse/significant other Available Help at Discharge: Family Type of Home: Apartment Home Access: Stairs to enter Secretary/administrator of Steps: 12   Home Layout: Two level;1/2 bath on main level;Bed/bath upstairs (told OT he could live on main floor) Alternate Level Stairs-Number of Steps:  1 flight Alternate Level Stairs-Rails: Right Bathroom Shower/Tub: Tub/shower unit Shower/tub characteristics: Engineer, building services: Standard Bathroom Accessibility: Yes How Accessible: Accessible via walker Home Equipment: Bedside commode          Prior Functioning/Environment Level of Independence: Independent        Comments: driving    OT Diagnosis:  Generalized weakness;Cognitive deficits (will further assess cognition. )   OT Problem List: Decreased strength;Decreased activity tolerance;Impaired balance (sitting and/or standing);Decreased safety awareness;Obesity   OT Treatment/Interventions: Self-care/ADL training;Therapeutic exercise;Energy conservation;DME and/or AE instruction;Therapeutic activities;Cognitive remediation/compensation;Balance training;Patient/family education    OT Goals(Current goals can be found in the care plan section) Acute Rehab OT Goals Patient Stated Goal: to go home OT Goal Formulation: With patient Time For Goal Achievement: 02/21/16 Potential to Achieve Goals: Good ADL Goals Pt Will Perform Lower Body Dressing: with modified independence;sit to/from stand Pt Will Transfer to Toilet: with modified independence;ambulating Additional ADL Goal #1: Pt will complete bathing or dressing task, demonstrating anticipatory awareness during activity Additional ADL Goal #2: Pt will complete "trail making" activity in distracting environment without vc.  OT Frequency: Min 2X/week   Barriers to D/C: Decreased caregiver support (wife works during the day)          Co-evaluation              End of Session Nurse Communication: Mobility status  Activity Tolerance: Patient tolerated treatment well Patient left: in bed;with call bell/phone within reach;with bed alarm set   Time: 1454-1515 OT Time Calculation (min): 21 min Charges:  OT General Charges $OT Visit: 1 Procedure OT Evaluation $OT Eval Moderate Complexity: 1 Procedure G-Codes:    Andreanna Mikolajczak,HILLARY Mar 02, 2016, 4:29 PM   [facility], OTR/L  [phone] March 02, 2016

## 2016-02-08 ENCOUNTER — Inpatient Hospital Stay (HOSPITAL_COMMUNITY): Payer: Managed Care, Other (non HMO)

## 2016-02-08 DIAGNOSIS — Z720 Tobacco use: Secondary | ICD-10-CM

## 2016-02-08 DIAGNOSIS — K7460 Unspecified cirrhosis of liver: Secondary | ICD-10-CM | POA: Diagnosis present

## 2016-02-08 DIAGNOSIS — F1010 Alcohol abuse, uncomplicated: Secondary | ICD-10-CM

## 2016-02-08 DIAGNOSIS — K7469 Other cirrhosis of liver: Secondary | ICD-10-CM

## 2016-02-08 LAB — LIPID PANEL
Cholesterol: 105 mg/dL (ref 0–200)
HDL: 31 mg/dL — ABNORMAL LOW (ref >40)
LDL Cholesterol: 62 mg/dL (ref 0–99)
Total CHOL/HDL Ratio: 3.4 RATIO
Triglycerides: 59 mg/dL (ref <150)
VLDL: 12 mg/dL (ref 0–40)

## 2016-02-08 LAB — CBC
HCT: 36.7 % — ABNORMAL LOW (ref 39.0–52.0)
Hemoglobin: 11.9 g/dL — ABNORMAL LOW (ref 13.0–17.0)
MCH: 32.3 pg (ref 26.0–34.0)
MCHC: 32.4 g/dL (ref 30.0–36.0)
MCV: 99.7 fL (ref 78.0–100.0)
Platelets: 130 10*3/uL — ABNORMAL LOW (ref 150–400)
RBC: 3.68 MIL/uL — ABNORMAL LOW (ref 4.22–5.81)
RDW: 12.7 % (ref 11.5–15.5)
WBC: 12.3 10*3/uL — ABNORMAL HIGH (ref 4.0–10.5)

## 2016-02-08 LAB — BASIC METABOLIC PANEL
Anion gap: 10 (ref 5–15)
BUN: 17 mg/dL (ref 6–20)
CO2: 26 mmol/L (ref 22–32)
Calcium: 8.6 mg/dL — ABNORMAL LOW (ref 8.9–10.3)
Chloride: 102 mmol/L (ref 101–111)
Creatinine, Ser: 1.21 mg/dL (ref 0.61–1.24)
GFR calc Af Amer: >60 mL/min (ref >60)
GFR calc non Af Amer: >60 mL/min (ref >60)
Glucose, Bld: 108 mg/dL — ABNORMAL HIGH (ref 65–99)
Potassium: 3.7 mmol/L (ref 3.5–5.1)
Sodium: 138 mmol/L (ref 135–145)

## 2016-02-08 LAB — MAGNESIUM: Magnesium: 1.8 mg/dL (ref 1.7–2.4)

## 2016-02-08 MED ORDER — FUROSEMIDE 40 MG PO TABS: 40.0000 mg | ORAL_TABLET | Freq: Two times a day (BID) | ORAL | Status: DC

## 2016-02-08 MED ORDER — METOPROLOL TARTRATE 100 MG PO TABS
100.0000 mg | ORAL_TABLET | Freq: Two times a day (BID) | ORAL | Status: DC
Start: 2016-02-08 — End: 2016-02-09
  Administered 2016-02-08 – 2016-02-09 (×2): 100 mg via ORAL
  Filled 2016-02-08 (×2): qty 1

## 2016-02-08 MED ORDER — ZOLPIDEM TARTRATE 5 MG PO TABS
5.0000 mg | ORAL_TABLET | Freq: Once | ORAL | Status: AC
  Administered 2016-02-08: 5 mg via ORAL
  Filled 2016-02-08: qty 1

## 2016-02-08 MED ORDER — HYDRALAZINE HCL 20 MG/ML IJ SOLN
10.0000 mg | Freq: Four times a day (QID) | INTRAMUSCULAR | Status: DC | PRN
  Administered 2016-02-09: 10 mg via INTRAVENOUS
  Filled 2016-02-08: qty 1

## 2016-02-08 MED ORDER — DIPHENHYDRAMINE HCL 25 MG PO CAPS
25.0000 mg | ORAL_CAPSULE | Freq: Once | ORAL | Status: AC
  Administered 2016-02-08: 25 mg via ORAL
  Filled 2016-02-08: qty 1

## 2016-02-08 MED ORDER — BUPROPION HCL ER (SR) 150 MG PO TB12
150.0000 mg | ORAL_TABLET | Freq: Every day | ORAL | Status: DC
  Administered 2016-02-08 – 2016-02-09 (×2): 150 mg via ORAL
  Filled 2016-02-08 (×2): qty 1

## 2016-02-08 MED ORDER — FUROSEMIDE 40 MG PO TABS
40.0000 mg | ORAL_TABLET | Freq: Two times a day (BID) | ORAL | Status: AC
  Administered 2016-02-08 – 2016-02-09 (×2): 40 mg via ORAL
  Filled 2016-02-08 (×2): qty 1

## 2016-02-08 MED ORDER — FUROSEMIDE 10 MG/ML IJ SOLN
40.0000 mg | Freq: Once | INTRAMUSCULAR | Status: AC
  Administered 2016-02-08: 40 mg via INTRAVENOUS
  Filled 2016-02-08: qty 4

## 2016-02-08 NOTE — Progress Notes (Signed)
PROGRESS NOTE    Kevin [name]  ZOX:[id number] DOB: 05-08-1951 DOA: 01/31/2016 PCP: Jackie Plum, MD   Brief Narrative:  65 y.o. BM PMHx Cocaine abuse; ETOH abuse; Marijuana abuse, Tobacco abuse, Dysrhythmia; Chronic Systolic CHF, Persistent atrial fibrillation (on Eliquis), Hepatitis C antibody test positive   Early AM 01/31/16. Wife states he went to bed and was last seen normal around 1am. At 2am, he was vomiting and had eye deviation to the right. EMS was summoned and pt was brought to [facility] ED for further evaluation. He was initially brought in as code stroke.  On ED arrival, pt began to have grand mal seizure. He received  ativan followed by 2nd dose of . Neurology was at bedside and ordered an 1g Keppra load. Pt was then intubated for airway protection. Prior to intubation, he had AFRVR with HR as high as 170's. After intubation and propofol, HR decreased to 150's.  CT of the head revealed equivocal subacute right occipital infarction but was otherwise unremarkable.  PCCM was called for admission.   Assessment & Plan:   Principal Problem:   Status epilepticus (HCC) Active Problems:   PAD (peripheral artery disease), PTA to Lt. SFA in 2009, total occlusion mid Rt SFA with collaterals    HTN (hypertension) -Accelerated with End organ involvement   ETOH abuse   NSVT (nonsustained ventricular tachycardia) (HCC)   Thrombocytopenia on admission 04/30/12, thought to be secondary to Xarelto, Platelets no better 2015   Hepatitis C antibody test positive   H/O medication noncompliance   Seizures (HCC)   CAD in native artery cath 2016 90% mRamus    Atrial fibrillation, permanent (HCC)   NSTEMI (non-ST elevated myocardial infarction) (HCC)   Acute respiratory failure with hypoxia (HCC)   Acute encephalopathy   Blood pressure instability   Secondary hypertension, unspecified   Elevated troponin   Metabolic encephalopathy   Chronic systolic CHF (congestive heart  failure) (HCC)   Paroxysmal atrial fibrillation (HCC)   Essential hypertension   HCV antibody positive   Acute on chronic renal failure (HCC)   Hypokalemia   Liver cirrhosis (HCC)   Substance abuse -5/10 UDS positive marijuana  Acute metabolic encephalopathy  -Most likely multifactorial to include polysubstance abuse, chronic systolic CHF, and Status epilepticus. -no seizure on continuous EEG. -Patient and wife counseled extensively on patient's sequela if he continues polysubstance abuse, alcohol abuse, tobacco abuse to include death. -Resolved  Status epilepticus/Seizures  -Continue Keppra 1000 mg  BID -Patient and wife counseled that patient will not be able to drive until cleared by neurology; 3-6 months?  NSTEMI/Chronic systolic CHF  -Per cardiology note from 5/10 patient's elevated troponin consistent with MI not a catheterization candidate -Patient and wife both state have never been informed of CHF.  -5/18 spoke with Amy in CHF clinic will see patient on 5/19 to enroll patient in CHF program/cardiac rehabilitation   -Increase metoprolol to 100 mg BID -Diltiazem 240 mg daily -Hydralazine IV 10 mg PRN SBP> 150 or DBP> 100 -Hold on ACEI/ARB secondary to renal failure -Increase Lasix 40 mg BID; in A.m. reevaluate dose amount -Lasix IV 40 mg 1 -Strict in and out since admission +4.3 L -Daily weight Filed Weights   02/06/16 0500 02/07/16 0412 02/08/16 0410  Weight: 94.7 kg (208 lb 12.4 oz) 95 kg (209 lb 7 oz) 95.2 kg (209 lb 14.1 oz)  -Consulted CSW discuss medication options; Pending -Consulted nutrition;Discuss heart healthy diet. Patient seen on 5/18 -PT/OT; recommends home health -Ambulate patient q  shift  Atrial fibrillation -Currently rate controlled and in NSR -See CHF -Continue Eliquis 5 mg BID  HTN -See CHF   Acute on chronic renal failure   -see CHF Lab Results  Component Value Date   CREATININE 1.21 02/08/2016   CREATININE 1.47* 02/07/2016    CREATININE 1.56* 02/06/2016  -Creatinine improving continue to follow closely   Hx HCV. -Unsure patient actively treated will follow-up with patient and wife in A.m. Ever received treatment? -HCV Quant pending -Obtain abdominal ultrasound; previous ultrasound 2013 possible cirrhosis; abdominal ultrasound unchanged from prior study. See results below  Thrombocytopenia - presumed due to bone marrow suppression due to ETOH. -Improving   Leukocytosis -Most likely reactive will monitor closely; if patient spikes fever will panculture.  Recent Labs Lab 02/08/16 0415  WBC 12.3*    Hyperglycemia  - no hx DM. -5/10 hemoglobin A1c= 4.9   Hypokalemia -Potassium goal> 4 -K Dur 50 mEq  Tobacco abuse -Wellbutrin 150 mg daily 3 days; then BID    DVT prophylaxis: Eliquis  Code Status: Full  Family Communication: Wife present  Disposition Plan: Incomplete diuresis, enroll in CHF clinic and cardiac rehabilitation   Consultants:  Dr.Wesam [facility] M   Dr.McNeill Windy Fast neurology Dr.Philip J Nahser [facility] MG   Procedures/Significant Events:  5/10 EEG; Clinical Interpretation: This EEG is most consistent with a sedated state. No seizure or seizure predisposition was recorded. Please note that a normal EEG does not preclude the possibility of epilepsy. 5/10 MRA/MRI head;MRI HEAD FINDINGS -Remote lacunar infarcts are present in the basal ganglia and right thalamus.  - Signal loss in the posterior right M2 segment suggests severe stenosis. -Significant attenuation of MCA branch vessels bilaterally. -Moderate narrowing is present in the distal P2 segments bilaterally. 5/11 echocardiogram- Left ventricle: moderate LVH. -LVEF= 35% to 40%. Diffuse hypokinesis. - Left atrium:  moderately dilated. 5/12 overnight EEG;This 23 hours of intensive EEG monitoring with simultaneous monitoring did not record any clinical or subclinical seizures.- Background activities were abnormal  suggestive of severe encephalopathy of nonspecific etiologies including toxic metabolic or pharmacologic etiologies.  5/14 CT head;-Chronic ischemic microvascular disease. -Small old right occipital/ posterior temporal infarct. 5/18 abdominal ultrasound;Multiple stones in the gallbladder, largest measuring about 1.5 cm.without change since prior study  Cultures 5/17 HCV RNA quant pending   Antimicrobials: NA   Devices NA   LINES / TUBES:  NA    Continuous Infusions:    Subjective: 5/18 A/O 4, NAD,  Objective: Filed Vitals:   02/08/16 1100 02/08/16 1500 02/08/16 1513 02/08/16 1612  BP: 122/91  153/112 163/100  Pulse: 82  81 99  Temp: 97.8 F (36.6 C) 98 F (36.7 C)    TempSrc: Oral Oral    Resp: 13  25 25   Height:      Weight:      SpO2: 95%   96%    Intake/Output Summary (Last 24 hours) at 02/08/16 1824 Last data filed at 02/08/16 0800  Gross per 24 hour  Intake     30 ml  Output    200 ml  Net   -170 ml   Filed Weights   02/06/16 0500 02/07/16 0412 02/08/16 0410  Weight: 94.7 kg (208 lb 12.4 oz) 95 kg (209 lb 7 oz) 95.2 kg (209 lb 14.1 oz)    Examination:  General: A/O 4, NAD,No acute respiratory distress Eyes: negative scleral hemorrhage, negative anisocoria, negative icterus ENT: Negative Runny nose, negative gingival bleeding, Neck:  Negative scars, masses, torticollis,  lymphadenopathy, JVD Lungs: Clear to auscultation bilaterally without wheezes or crackles Cardiovascular: Regular rate and rhythm without murmur gallop or rub normal S1 and S2 Abdomen: negative abdominal pain,  positive distention(somewhat improved from 5/17), positive soft, bowel sounds, no rebound, no ascites, no appreciable mass Extremities: No significant cyanosis, clubbing, or edema bilateral lower extremities Skin: Negative rashes, lesions, ulcers Psychiatric:  Negative depression, negative anxiety, negative fatigue, negative mania  Central nervous system:  Cranial nerves II  through XII intact, tongue/uvula midline, all extremities muscle strength 5/5, sensation intact throughout,  negative dysarthria, negative expressive aphasia, negative receptive aphasia.  .     Data Reviewed: Care during the described time interval was provided by me .  I have reviewed this patient's available data, including medical history, events of note, physical examination, and all test results as part of my evaluation. I have personally reviewed and interpreted all radiology studies.  CBC:  Recent Labs Lab 02/04/16 0312 02/04/16 0430 02/05/16 0400 02/06/16 0336 02/07/16 0415 02/08/16 0415  WBC 4.3 7.8 14.5* 12.0* 13.3* 12.3*  NEUTROABS 3.4 6.3 12.6* 10.1* 11.5*  --   HGB 6.7* 12.4* 13.1 12.6* 12.2* 11.9*  HCT 22.3* 38.9* 39.3 39.5 37.5* 36.7*  MCV 104.2* 102.9* 101.8* 102.1* 101.4* 99.7  PLT 47* 100* 131* 125* 138* 130*   Basic Metabolic Panel:  Recent Labs Lab 02/03/16 0418 02/04/16 0430 02/05/16 0400 02/06/16 0336 02/07/16 0415 02/08/16 0415  NA 143 143 144 144 142 138  K 3.5 3.8 3.7 3.0* 3.1* 3.7  CL 110 111 109 103 101 102  CO2 22 21* 25 28 29 26   GLUCOSE 118* 143* 148* 112* 102* 108*  BUN 27* 26* 26* 24* 22* 17  CREATININE 1.66* 1.58* 1.43* 1.56* 1.47* 1.21  CALCIUM 8.1* 8.6* 8.6* 8.9 8.7* 8.6*  MG 1.9 2.3 1.8 1.7 2.0 1.8  PHOS 2.8 2.7 3.6 3.1 3.3  --    GFR: Estimated Creatinine Clearance: 72.7 mL/min (by C-G formula based on Cr of 1.21). Liver Function Tests: No results for input(s): AST, ALT, ALKPHOS, BILITOT, PROT, ALBUMIN in the last 168 hours. No results for input(s): LIPASE, AMYLASE in the last 168 hours. No results for input(s): AMMONIA in the last 168 hours. Coagulation Profile: No results for input(s): INR, PROTIME in the last 168 hours. Cardiac Enzymes:  Recent Labs Lab 02/04/16 0430 02/05/16 0400  TROPONINI 2.37* 1.61*   BNP (last 3 results) No results for input(s): PROBNP in the last 8760 hours. HbA1C: No results for input(s):  HGBA1C in the last 72 hours. CBG:  Recent Labs Lab 02/05/16 1547 02/05/16 2014 02/05/16 2354 02/06/16 0335 02/06/16 0853  GLUCAP 108* 99 135* 112* 129*   Lipid Profile:  Recent Labs  02/08/16 0435  CHOL 105  HDL 31*  LDLCALC 62  TRIG 59  CHOLHDL 3.4   Thyroid Function Tests: No results for input(s): TSH, T4TOTAL, FREET4, T3FREE, THYROIDAB in the last 72 hours. Anemia Panel: No results for input(s): VITAMINB12, FOLATE, FERRITIN, TIBC, IRON, RETICCTPCT in the last 72 hours. Urine analysis:    Component Value Date/Time   COLORURINE AMBER* 01/31/2016 0651   APPEARANCEUR CLOUDY* 01/31/2016 0651   LABSPEC 1.016 01/31/2016 0651   PHURINE 5.5 01/31/2016 0651   GLUCOSEU 250* 01/31/2016 0651   HGBUR LARGE* 01/31/2016 0651   BILIRUBINUR NEGATIVE 01/31/2016 0651   KETONESUR NEGATIVE 01/31/2016 0651   PROTEINUR >300* 01/31/2016 0651   NITRITE NEGATIVE 01/31/2016 0651   LEUKOCYTESUR NEGATIVE 01/31/2016 0651   Sepsis Labs: @LABRCNTIP (procalcitonin:4,lacticidven:4)  )  Recent Results (from the past 240 hour(s))  Culture, blood (routine x 2)     Status: Abnormal   Collection Time: 01/31/16  5:49 AM  Result Value Ref Range Status   Specimen Description BLOOD LEFT ARM  Final   Special Requests BOTTLES DRAWN AEROBIC AND ANAEROBIC  Final   Culture  Setup Time   Final    GRAM POSITIVE COCCI IN CLUSTERS ANAEROBIC BOTTLE ONLY Organism ID to follow CRITICAL RESULT CALLED TO, READ BACK BY AND VERIFIED WITH: Edd Arbour D AT 1243 02/01/16 BY L BENFIELD    Culture (A)  Final    STAPHYLOCOCCUS SPECIES (COAGULASE NEGATIVE) THE SIGNIFICANCE OF ISOLATING THIS ORGANISM FROM A SINGLE SET OF BLOOD CULTURES WHEN MULTIPLE SETS ARE DRAWN IS UNCERTAIN. PLEASE NOTIFY THE MICROBIOLOGY DEPARTMENT WITHIN ONE WEEK IF SPECIATION AND SENSITIVITIES ARE REQUIRED.    Report Status 02/03/2016 FINAL  Final  Blood Culture ID Panel (Reflexed)     Status: Abnormal   Collection Time: 01/31/16  5:49 AM    Result Value Ref Range Status   Enterococcus species NOT DETECTED NOT DETECTED Final   Vancomycin resistance NOT DETECTED NOT DETECTED Final   Listeria monocytogenes NOT DETECTED NOT DETECTED Final   Staphylococcus species DETECTED (A) NOT DETECTED Final    Comment: CRITICAL RESULT CALLED TO, READ BACK BY AND VERIFIED WITH: Veronia Beets AT 1243 02/01/16 BY L BENFIELD    Staphylococcus aureus NOT DETECTED NOT DETECTED Final   Methicillin resistance NOT DETECTED NOT DETECTED Final   Streptococcus species NOT DETECTED NOT DETECTED Final   Streptococcus agalactiae NOT DETECTED NOT DETECTED Final   Streptococcus pneumoniae NOT DETECTED NOT DETECTED Final   Streptococcus pyogenes NOT DETECTED NOT DETECTED Final   Acinetobacter baumannii NOT DETECTED NOT DETECTED Final   Enterobacteriaceae species NOT DETECTED NOT DETECTED Final   Enterobacter cloacae complex NOT DETECTED NOT DETECTED Final   Escherichia coli NOT DETECTED NOT DETECTED Final   Klebsiella oxytoca NOT DETECTED NOT DETECTED Final   Klebsiella pneumoniae NOT DETECTED NOT DETECTED Final   Proteus species NOT DETECTED NOT DETECTED Final   Serratia marcescens NOT DETECTED NOT DETECTED Final   Carbapenem resistance NOT DETECTED NOT DETECTED Final   Haemophilus influenzae NOT DETECTED NOT DETECTED Final   Neisseria meningitidis NOT DETECTED NOT DETECTED Final   Pseudomonas aeruginosa NOT DETECTED NOT DETECTED Final   Candida albicans NOT DETECTED NOT DETECTED Final   Candida glabrata NOT DETECTED NOT DETECTED Final   Candida krusei NOT DETECTED NOT DETECTED Final   Candida parapsilosis NOT DETECTED NOT DETECTED Final   Candida tropicalis NOT DETECTED NOT DETECTED Final  Culture, blood (routine x 2)     Status: None   Collection Time: 01/31/16  5:54 AM  Result Value Ref Range Status   Specimen Description BLOOD LEFT ARM  Final   Special Requests BOTTLES DRAWN AEROBIC AND ANAEROBIC  Final   Culture NO GROWTH 5 DAYS  Final    Report Status 02/06/2016 FINAL  Final  MRSA PCR Screening     Status: None   Collection Time: 01/31/16  6:34 AM  Result Value Ref Range Status   MRSA by PCR NEGATIVE NEGATIVE Final    Comment:        The GeneXpert MRSA Assay (FDA approved for NASAL specimens only), is one component of a comprehensive MRSA colonization surveillance program. It is not intended to diagnose MRSA infection nor to guide or monitor treatment for MRSA infections.   Urine culture  Status: None   Collection Time: 01/31/16  6:51 AM  Result Value Ref Range Status   Specimen Description URINE, CATHETERIZED  Final   Special Requests Normal  Final   Culture NO GROWTH  Final   Report Status 02/01/2016 FINAL  Final         Radiology Studies: US Abdomen Limited Ruq  02/28/2016  CLINICAL DATA:  History of hepatitis-C and alcohol abuse. Follow-up from previous ultrasound in 2013. EXAM: US ABDOMEN LIMITED - RIGHT UPPER QUADRANT COMPARISON:  04/30/2012 FINDINGS: Gallbladder: Multiple stones in the gallbladder, largest measuring about 1.5 cm. No gallbladder wall thickening or edema. Murphy's sign is negative. Similar appearance to prior study. Common bile duct: Diameter: 2.4 mm, normal Liver: No focal lesion identified. Within normal limits in parenchymal echogenicity. IMPRESSION: Cholelithiasis without change since prior study. Electronically Signed   By: Burman Nieves M.D.   On: 2016/02/28 03:45        Scheduled Meds: . apixaban  5 mg Oral BID  . aspirin  81 mg Oral Daily  . diltiazem  240 mg Oral Daily  . folic acid  1 mg Oral Daily  . furosemide  40 mg Oral BID  . levETIRAcetam  1,000 mg Oral BID  . metoprolol tartrate  100 mg Oral BID  . pantoprazole  40 mg Oral QHS  . rosuvastatin  20 mg Oral q1800  . sodium chloride flush  10-40 mL Intracatheter Q12H  . thiamine  100 mg Oral Daily   Continuous Infusions:    LOS: 8 days    Time spent: 40 minutes    Levon Boettcher, Roselind Messier, MD [facility] Pager [phone]   If 7PM-7AM, please contact night-coverage [email or web address] Password [facility] Feb 28, 2016, 6:24 PM

## 2016-02-08 NOTE — Plan of Care (Signed)
Problem: Food- and Nutrition-Related Knowledge Deficit (NB-1.1) Goal: Nutrition education Formal process to instruct or train a patient/client in a skill or to impart knowledge to help patients/clients voluntarily manage or modify food choices and eating behavior to maintain or improve health. Outcome: Completed/Met Date Met:  02/08/16 BRIEF NUTRITION NOTE  Pt and pt's wife seen for diet education for heart healthy diet. Pt provided with handouts "Label Reading for Heart Healthy Diet" and "TLC Diet Nutrition Therapy" by the Academy of [employer] Dietetics. Intern discussed label reading, limiting fats and cholesterol, increasing fiber in the diet, and importance of physical activity.   Expect good compliance.  Pt was less interested in education. Pt's wife states she does the cooking and will providing pt with heart healthy diet. Pt's wife utilized teach back method. Labs and meds reviewed.  Geoffery Lyons, [place] [facility] Dietetic Intern Pager [phone]

## 2016-02-08 NOTE — Progress Notes (Signed)
Occupational Therapy Treatment Patient Details Name: KELECHI ORGERON MRN: [id number] DOB: 09-03-1951 Today's Date: 02/08/2016    History of present illness 65 y.o. M with hx ETOH and cocaine abuse, admitted 05/10 with status epilepticus and possible right occipital CVA. He required intubation in ED   OT comments  Pt requires supervision to min guard assist for ADLs.  He demonstrates memory deficits as well a impaired attention (wife confirms this is new).  He will use 3in1 commode for tub seat.   Recommend HHOT at discharge.   Follow Up Recommendations  Home health OT;Supervision - Intermittent    Equipment Recommendations  None recommended by OT    Recommendations for Other Services      Precautions / Restrictions         Mobility Bed Mobility Overal bed mobility: Modified Independent                Transfers Overall transfer level: Needs assistance Equipment used: None Transfers: Sit to/from Stand;Stand Pivot Transfers Sit to Stand: Supervision Stand pivot transfers: Supervision            Balance Overall balance assessment: Needs assistance Sitting-balance support: Feet supported Sitting balance-Leahy Scale: Good     Standing balance support: During functional activity;No upper extremity supported Standing balance-Leahy Scale: Fair                     ADL                                         General ADL Comments: Pt requires min guard assist with ADLs.  Wife was present briefly at beginning of session       Vision Eye Alignment: Within Functional Limits Alignment/Gaze Preference: Within Defined Limits Ocular Range of Motion: Within Functional Limits Tracking/Visual Pursuits: Able to track stimulus in all quads without difficulty   Convergence: Within functional limits     Additional Comments: Pt with significant difficulty with reading - multiple word subsitutions.  He reports he is not highly educated.  He drove  a truck for a living, and only reads the comics.May be his baseline    Perception     Praxis      Cognition   Behavior During Therapy: WFL for tasks assessed/performed Overall Cognitive Status: Impaired/Different from baseline Area of Impairment: Attention;Memory   Current Attention Level: Selective Memory: Decreased short-term memory          General Comments: wife confirms pt with new cognitive deficits.  Pt asking same questions repetitively     Extremity/Trunk Assessment               Exercises     Shoulder Instructions       General Comments      Pertinent Vitals/ Pain       Pain Assessment: No/denies pain  Home Living                                          Prior Functioning/Environment              Frequency Min 2X/week     Progress Toward Goals  OT Goals(current goals can now be found in the care plan section)  Progress towards OT goals: Progressing toward goals  ADL Goals Pt  Will Perform Lower Body Dressing: with modified independence;sit to/from stand Pt Will Transfer to Toilet: with modified independence;ambulating Additional ADL Goal #1: Pt will complete bathing or dressing task, demonstrating anticipatory awareness during activity Additional ADL Goal #2: Pt will complete "trail making" activity in distracting environment without vc.  Plan Discharge plan remains appropriate    Co-evaluation                 End of Session     Activity Tolerance Patient tolerated treatment well   Patient Left in bed;with call bell/phone within reach;with bed alarm set   Nurse Communication Mobility status        Time: 4619-0122 OT Time Calculation (min): 23 min  Charges: OT General Charges $OT Visit: 1 Procedure OT Treatments $Therapeutic Activity: 23-37 mins  Caralynn Gelber M 02/08/2016, 3:52 PM

## 2016-02-08 NOTE — Discharge Instructions (Signed)

## 2016-02-08 NOTE — Clinical Social Work Note (Signed)
CSW acknowledges consult. Will let RNCM know that patient needs to discuss medication options.   CSW signing off.  Charlynn Court, CSW [phone]

## 2016-02-09 DIAGNOSIS — Z9119 Patient's noncompliance with other medical treatment and regimen: Secondary | ICD-10-CM

## 2016-02-09 LAB — BASIC METABOLIC PANEL
Anion gap: 11 (ref 5–15)
BUN: 20 mg/dL (ref 6–20)
CO2: 28 mmol/L (ref 22–32)
Calcium: 8.9 mg/dL (ref 8.9–10.3)
Chloride: 101 mmol/L (ref 101–111)
Creatinine, Ser: 1.41 mg/dL — ABNORMAL HIGH (ref 0.61–1.24)
GFR calc Af Amer: 59 mL/min — ABNORMAL LOW (ref >60)
GFR calc non Af Amer: 51 mL/min — ABNORMAL LOW (ref >60)
Glucose, Bld: 101 mg/dL — ABNORMAL HIGH (ref 65–99)
Potassium: 3.5 mmol/L (ref 3.5–5.1)
Sodium: 140 mmol/L (ref 135–145)

## 2016-02-09 LAB — MAGNESIUM: Magnesium: 2 mg/dL (ref 1.7–2.4)

## 2016-02-09 LAB — HCV RNA QUANT
HCV Quantitative Log: 5.223 log10 IU/mL (ref >1.70)
HCV Quantitative: 167000 IU/mL (ref >50)

## 2016-02-09 LAB — CBC
HCT: 39.9 % (ref 39.0–52.0)
Hemoglobin: 13.1 g/dL (ref 13.0–17.0)
MCH: 32 pg (ref 26.0–34.0)
MCHC: 32.8 g/dL (ref 30.0–36.0)
MCV: 97.6 fL (ref 78.0–100.0)
Platelets: 134 10*3/uL — ABNORMAL LOW (ref 150–400)
RBC: 4.09 MIL/uL — ABNORMAL LOW (ref 4.22–5.81)
RDW: 12.6 % (ref 11.5–15.5)
WBC: 10.9 10*3/uL — ABNORMAL HIGH (ref 4.0–10.5)

## 2016-02-09 LAB — GLUCOSE, CAPILLARY: Glucose-Capillary: 96 mg/dL (ref 65–99)

## 2016-02-09 MED ORDER — FUROSEMIDE 40 MG PO TABS
40.0000 mg | ORAL_TABLET | Freq: Every day | ORAL | Status: DC
Start: 2016-02-09 — End: 2020-03-06

## 2016-02-09 MED ORDER — FUROSEMIDE 40 MG PO TABS
40.0000 mg | ORAL_TABLET | Freq: Every day | ORAL | Status: DC
  Administered 2016-02-09: 40 mg via ORAL
  Filled 2016-02-09: qty 1

## 2016-02-09 MED ORDER — CLONIDINE HCL 0.2 MG PO TABS: 0.2000 mg | ORAL_TABLET | Freq: Two times a day (BID) | ORAL | Status: DC

## 2016-02-09 MED ORDER — BUPROPION HCL ER (SR) 150 MG PO TB12: 150.0000 mg | ORAL_TABLET | Freq: Two times a day (BID) | ORAL | Status: DC

## 2016-02-09 MED ORDER — POTASSIUM CHLORIDE CRYS ER 20 MEQ PO TBCR: 20.0000 meq | EXTENDED_RELEASE_TABLET | Freq: Every day | ORAL | Status: DC

## 2016-02-09 MED ORDER — CLONIDINE HCL 0.2 MG PO TABS
0.2000 mg | ORAL_TABLET | Freq: Two times a day (BID) | ORAL | Status: DC
  Administered 2016-02-09: 0.2 mg via ORAL
  Filled 2016-02-09: qty 1

## 2016-02-09 MED ORDER — PANTOPRAZOLE SODIUM 40 MG PO TBEC: 40.0000 mg | DELAYED_RELEASE_TABLET | Freq: Every day | ORAL | Status: DC

## 2016-02-09 MED ORDER — LEVETIRACETAM 1000 MG PO TABS: 1000.0000 mg | ORAL_TABLET | Freq: Two times a day (BID) | ORAL | Status: DC

## 2016-02-09 MED ORDER — METOPROLOL TARTRATE 100 MG PO TABS: 100.0000 mg | ORAL_TABLET | Freq: Two times a day (BID) | ORAL | Status: DC

## 2016-02-09 MED ORDER — FOLIC ACID 1 MG PO TABS: 1.0000 mg | ORAL_TABLET | Freq: Every day | ORAL | Status: DC

## 2016-02-09 NOTE — Care Management (Signed)
ED CM contacted by Wylene Men RN on 3S regarding patient needing an Eliquis 30 day free trail card. This CM delivered card with instruction on how to redeem to patient and wife at bedside  Prior to discharge, patient and wife verbalized understanding no further question or concerns verbalized. Updated Wylene Men RN no additional CM needs identified.

## 2016-02-09 NOTE — Progress Notes (Signed)
MD woods Aware, new orders receieved

## 2016-02-09 NOTE — Progress Notes (Signed)
Physical Therapy Treatment Patient Details Name: Kevin [name] MRN: [id number] DOB: 01/08/1951 Today's Date: Feb 25, 2016    History of Present Illness 65 y.o. M with hx ETOH and cocaine abuse, admitted 05/10 with status epilepticus and possible right occipital CVA. He required intubation in ED    PT Comments    Patient progressing with tolerance to ambulation and more steady with cane use.  Still needs skilled PT in the acute setting for further practice with cane and for stair negotiation.   Follow Up Recommendations  Home health PT;Supervision/Assistance - 24 hour     Equipment Recommendations  Cane    Recommendations for Other Services       Precautions / Restrictions Precautions Precautions: Fall Restrictions Weight Bearing Restrictions: No    Mobility  Bed Mobility Overal bed mobility: Modified Independent                Transfers   Equipment used: Straight cane Transfers: Sit to/from Stand Sit to Stand: Supervision         General transfer comment: assist for lines, cues for cane use  Ambulation/Gait   Ambulation Distance (Feet): 600 Feet Assistive device: Straight cane Gait Pattern/deviations: Step-through pattern;Wide base of support;Drifts right/left     General Gait Details: using cane appropriatelyand noting improved steadiness, occasionally close to obstacles on R side, cane adjusted for height    Stairs            Wheelchair Mobility    Modified Rankin (Stroke Patients Only)       Balance Overall balance assessment: Needs assistance           Standing balance-Leahy Scale: Fair Standing balance comment: moving in room and bathroom with cane and cues for safety esp with telemetry wires                    Cognition Arousal/Alertness: Awake/alert Behavior During Therapy: WFL for tasks assessed/performed Overall Cognitive Status: Impaired/Different from baseline       Memory: Decreased short-term memory          General Comments: wife confirms pt with new cognitive deficits.  Pt asking same questions repetitively     Exercises      General Comments        Pertinent Vitals/Pain Pain Assessment: No/denies pain    Home Living                      Prior Function            PT Goals (current goals can now be found in the care plan section) Progress towards PT goals: Progressing toward goals    Frequency  Min 3X/week    PT Plan Current plan remains appropriate    Co-evaluation             End of Session Equipment Utilized During Treatment: Gait belt Activity Tolerance: Patient tolerated treatment well Patient left: in bed;with call bell/phone within reach     Time: 1019-1045 PT Time Calculation (min) (ACUTE ONLY): 26 min  Charges:  $Gait Training: 23-37 mins                    G CodesElray Mcgregor 02-25-2016, 12:06 PM  Sheran Lawless, PT [phone] February 25, 2016

## 2016-02-09 NOTE — Progress Notes (Signed)
Patient discharge instructions provided, handouts given. Patient discharged with wife, has instructions and appointments. Case management brought [facility] card.

## 2016-02-09 NOTE — Discharge Summary (Signed)
Physician Discharge Summary  Kevin [name] UEA:[id number] DOB: 04-22-1951 DOA: 01/31/2016  PCP: Jackie Plum, MD  Admit date: 01/31/2016 Discharge date: 02/09/2016  Time spent:35 minutes  Recommendations for Outpatient Follow-up:   Substance abuse -5/10 UDS positive marijuana -Patient and wife counseled extensively on his need to permanently stop abusing tobacco, marijuana, cocaine, alcohol. Counseled on sequela of not following these recommendations to include death.  Acute metabolic encephalopathy  -Most likely multifactorial to include polysubstance abuse, chronic systolic CHF, and Status epilepticus. -no seizure on continuous EEG. -Patient and wife counseled extensively on patient's sequela if he continues polysubstance abuse, alcohol abuse, tobacco abuse to include death. -Resolved  Status epilepticus/Seizures  -Continue Keppra 1000 mg BID -Patient and wife counseled that patient will not be able to drive until cleared by neurology; 3-6 months  NSTEMI/Chronic systolic CHF  -Per cardiology note from 5/10 patient's elevated troponin consistent with MI not a catheterization candidate -5/18 spoke with Amy in CHF clinic will see patient on 5/19 to enroll patient in CHF program/cardiac rehabilitation  -Metoprolol to 100 mg BID -Diltiazem 240 mg daily -Clonidine 0.2 mg BID -Hold on ACEI/ARB secondary to renal failure -Lasix 40 mg daily -Weight on discharge 92.7 kg (204 pounds) this will be patient's dry weight.  -Patient is to weigh himself Daily and record in maternal. -Consulted nutrition;Discussed heart healthy diet. Patient seen on 5/18 -PT/OT; recommends home health   Atrial fibrillation -Currently rate controlled and in NSR -See CHF -Continue Eliquis 5 mg BID  HTN -See CHF  Acute on chronic renal failure  -see CHF  Hyperglycemia  - no hx DM. -5/10 hemoglobin A1c= 4.9   Hypokalemia -Potassium goal> 4 -K Dur 20 mEq daily  Tobacco  abuse -Wellbutrin 150 mg BID  Hepatitis C virus -Patient positive for hepatitis C follow-up with PCP in 2 weeks will need to discuss treatment options     Discharge Diagnoses:  Principal Problem:   Status epilepticus (HCC) Active Problems:   PAD (peripheral artery disease), PTA to Lt. SFA in 2009, total occlusion mid Rt SFA with collaterals    HTN (hypertension) -Accelerated with End organ involvement   ETOH abuse   NSVT (nonsustained ventricular tachycardia) (HCC)   Thrombocytopenia on admission 04/30/12, thought to be secondary to Xarelto, Platelets no better 2015   Hepatitis C antibody test positive   H/O medication noncompliance   Seizures (HCC)   CAD in native artery cath 2016 90% mRamus    Atrial fibrillation, permanent (HCC)   NSTEMI (non-ST elevated myocardial infarction) (HCC)   Acute respiratory failure with hypoxia (HCC)   Acute encephalopathy   Blood pressure instability   Secondary hypertension, unspecified   Elevated troponin   Metabolic encephalopathy   Chronic systolic CHF (congestive heart failure) (HCC)   Paroxysmal atrial fibrillation (HCC)   Essential hypertension   HCV antibody positive   Acute on chronic renal failure (HCC)   Hypokalemia   Liver cirrhosis (HCC)   Discharge Condition: Stable  Diet recommendation: Heart healthy  Filed Weights   02/07/16 0412 02/08/16 0410 02/09/16 0326  Weight: 95 kg (209 lb 7 oz) 95.2 kg (209 lb 14.1 oz) 92.7 kg (204 lb 5.9 oz)    History of present illness:  65 y.o. BM PMHx Cocaine abuse; ETOH abuse; Marijuana abuse, Tobacco abuse, Dysrhythmia; Chronic Systolic CHF, Persistent atrial fibrillation (on Eliquis), Hepatitis C antibody test positive   Early AM 01/31/16. Wife states he went to bed and was last seen normal around 1am. At 2am, he was  vomiting and had eye deviation to the right. EMS was summoned and pt was brought to [facility] ED for further evaluation. He was initially brought in as code stroke.  On ED  arrival, pt began to have grand mal seizure. He received 2mg  ativan followed by 2nd dose of 2mg . Neurology was at bedside and ordered an 1g Keppra load. Pt was then intubated for airway protection. Prior to intubation, he had AFRVR with HR as high as 170's. After intubation and propofol, HR decreased to 150's.  CT of the head revealed equivocal subacute right occipital infarction but was otherwise unremarkable.  PCCM was called for admission. During his hospitalization patient was treated for NSTEMI, chronic systolic CHF, atrial fibrillation, acute on chronic renal failure. Aforementioned diagnosis brought on by cocaine abuse, EtOH abuse, marijuana abuse, tobacco abuse, noncompliance with medication. Patient did not receive cardiac catheterization secondary to cardiology filling patient was a poor candidate. Appropriate medication has been started/adjusted and patient has responded well. Patient and wife understand patient is to follow-up with CHF clinic.   Consultants:  Dr.Wesam Santa Genera [facility] M  Dr.McNeill Windy Fast neurology Dr.Philip J Nahser [facility] MG   Procedures/Significant Events:  5/10 EEG; Clinical Interpretation: This EEG is most consistent with a sedated state. No seizure or seizure predisposition was recorded. Please note that a normal EEG does not preclude the possibility of epilepsy. 5/10 MRA/MRI head;MRI HEAD FINDINGS -Remote lacunar infarcts are present in the basal ganglia and right thalamus.  - Signal loss in the posterior right M2 segment suggests severe stenosis. -Significant attenuation of MCA branch vessels bilaterally. -Moderate narrowing is present in the distal P2 segments bilaterally. 5/11 echocardiogram- Left ventricle: moderate LVH. -LVEF= 35% to 40%. Diffuse hypokinesis. - Left atrium: moderately dilated. 5/12 overnight EEG;This 23 hours of intensive EEG monitoring with simultaneous monitoring did not record any clinical or subclinical seizures.-  Background activities were abnormal suggestive of severe encephalopathy of nonspecific etiologies including toxic metabolic or pharmacologic etiologies.  5/14 CT head;-Chronic ischemic microvascular disease. -Small old right occipital/ posterior temporal infarct. 5/18 abdominal ultrasound;Multiple stones in the gallbladder, largest measuring about 1.5 cm.without change since prior study  Cultures 5/17 HCV RNA; =167,000    Discharge Exam: Filed Vitals:   02/09/16 1218 02/09/16 1300 02/09/16 1500 02/09/16 1559  BP: 115/92   117/76  Pulse: 84   86  Temp:  97.4 F (36.3 C) 97.6 F (36.4 C) 97.5 F (36.4 C)  TempSrc:  Oral Oral Oral  Resp: 16   20  Height:      Weight:      SpO2: 98%       General: A/O 4, NAD,No acute respiratory distress Eyes: negative scleral hemorrhage, negative anisocoria, negative icterus ENT: Negative Runny nose, negative gingival bleeding, Neck: Negative scars, masses, torticollis, lymphadenopathy, JVD Lungs: Clear to auscultation bilaterally without wheezes or crackles Cardiovascular: Regular rate and rhythm without murmur gallop or rub normal S1 and S2 Abdomen: negative abdominal pain, positive distention(somewhat improved from 5/17), positive soft, bowel sounds, no rebound, no ascites, no appreciable mass Extremities: No significant cyanosis, clubbing, or edema bilateral lower extremities   Discharge Instructions     Medication List    ASK your doctor about these medications        apixaban 5 MG Tabs tablet  Commonly known as:  ELIQUIS  Take 1 tablet (5 mg total) by mouth 2 (two) times daily.     diltiazem 240 MG 24 hr capsule  Commonly known as:  CARDIZEM CD  Take 1 capsule (240 mg total) by mouth daily.     folic acid 1 MG tablet  Commonly known as:  FOLVITE  Take 1 tablet (1 mg total) by mouth daily. Need appointment before anymore refills     furosemide 20 MG tablet  Commonly known as:  LASIX  Take 20 mg by mouth 2 (two) times  daily.     isosorbide mononitrate 30 MG 24 hr tablet  Commonly known as:  IMDUR  Take 1 tablet (30 mg total) by mouth daily.     multivitamin with minerals Tabs tablet  Take 1 tablet by mouth daily. Men's One a Day     rosuvastatin 20 MG tablet  Commonly known as:  CRESTOR  Take 1 tablet (20 mg total) by mouth daily.     verapamil 120 MG CR tablet  Commonly known as:  CALAN-SR  Take 120 mg by mouth daily.       No Known Allergies     Follow-up Information    Follow up with Arvilla Meres, MD On 02/21/2016.   Specialty:  Cardiology   Why:  Heart Failure Clinic at 2:[facility] on 1st Floor Entrance "C" Garage Code [id number]    Contact information:   [address] [place] [address] [phone]        The results of significant diagnostics from this hospitalization (including imaging, microbiology, ancillary and laboratory) are listed below for reference.    Significant Diagnostic Studies: Ct Head Wo Contrast  02/04/2016  CLINICAL DATA:  Small subacute infarct right occipital region. Blood pressure instability. EXAM: CT HEAD WITHOUT CONTRAST TECHNIQUE: Contiguous axial images were obtained from the base of the skull through the vertex without intravenous contrast. COMPARISON:  01/31/2016 and 08/23/2015 as well as MRI 01/31/2016 FINDINGS: Postsurgical changes of the occipital skull. Ventricles, cisterns and other CSF spaces are within normal. Evidence of small old right occipital/posterior temporal infarct. Mild chronic ischemic microvascular disease is present. No evidence of mass, mass effect, shift of midline structures or acute hemorrhage. No definite acute right MCA territory infarct as suggested on recent MRI. Remainder the exam is unchanged. IMPRESSION: No acute intracranial findings. Chronic ischemic microvascular disease. Small old right occipital/ posterior temporal infarct. Electronically Signed   By: Elberta Fortis M.D.   On: 02/04/2016 11:23   Ct  Head Wo Contrast  01/31/2016  CLINICAL DATA:  Altered mental status. EXAM: CT HEAD WITHOUT CONTRAST TECHNIQUE: Contiguous axial images were obtained from the base of the skull through the vertex without intravenous contrast. COMPARISON:  08/23/2015 FINDINGS: There is no intracranial hemorrhage or extra-axial fluid collection. There is focal hypodensity in the right occipital region which is new, consistent with a subacute infarction. Mild sulcal effacement but no mass effect on the ventricles. No midline shift. No other significant interval change. There is unchanged mild generalized atrophy. There is extensive white matter hypodensity which is unchanged, likely due to small vessel ischemic disease. There is a bony defect of the right posterior parietal calvarium which is chronic and unchanged. No acute bony abnormality is evident. IMPRESSION: Small subacute infarction of the right occipital lobe, nonhemorrhagic. No midline shift. This is superimposed on chronic changes of mild atrophy and white matter hypodensities which are likely due to small vessel disease. These results were called by telephone at the time of interpretation on 01/31/2016 at 3:54 am to Dr. Noel Christmas, who verbally acknowledged these results. Electronically Signed   By: Rosey Bath.D.  On: 01/31/2016 03:54   Mr Maxine Glenn Head Wo Contrast  01/31/2016  CLINICAL DATA:  Seizures.  Abnormal CT. EXAM: MRI HEAD WITHOUT CONTRAST MRA HEAD WITHOUT CONTRAST TECHNIQUE: Multiplanar, multiecho pulse sequences of the brain and surrounding structures were obtained without intravenous contrast. Angiographic images of the head were obtained using MRA technique without contrast. COMPARISON:  None. FINDINGS: MRI HEAD FINDINGS Areas of restricted diffusion trace signal abnormality are present in the right centrum semi ovale and right occipital lobe. There is no definite restricted diffusion in the occipital lobe. There is minimal restricted diffusion in  the right centrum semi ovale. Extensive white matter changes are present bilaterally. Focal white matter changes correspond with the areas of diffusion trace signal in the right centrum semi ovale and right occipital lobe. The right occipital area appears represent remote encephalomalacia. Remote lacunar infarcts are present in the basal ganglia and right thalamus. The brainstem and cerebellum are normal. The internal auditory canals are within normal limits bilaterally. Flow is present in the major intracranial arteries. The globes and orbits are intact. A fluid level is present in the left maxillary sinus. Mild mucosal thickening is present throughout the maxillary sinus. A fluid level is present in the nasopharynx. There is some fluid in the right mastoid air cells. The skullbase is otherwise within normal limits. Midline sagittal images are unremarkable. MRA HEAD FINDINGS Atherosclerotic changes are present within the cavernous internal carotid arteries bilaterally without focal stenosis. The A1 and M1 segments are normal. There is mild narrowing of the distal left M1 segment. Signal loss in the posterior right M2 segment suggests severe stenosis. The superior segment is not well seen. There is significant attenuation of MCA branch vessels bilaterally. The right vertebral artery is slightly dominant to the left. The PICA origin is visualized. Atherosclerotic changes are present throughout the the basilar artery without focal stenosis. Both posterior cerebral arteries originate the basilar tip. Moderate narrowing is present in the distal P2 segments bilaterally. The distal PCA branch vessels are poorly visualized. IMPRESSION: 1. Focal area of remote encephalomalacia involving the right occipital lobe. T2 shine through is evident on the diffusion-weighted images. 2. Indeterminate area of diffusion abnormality in the right centrum semi ovale. This may also represent T2 shine through or a late subacute infarct.  Repeat MRI in 1-2 months could be used for further evaluation if clinically indicated. 3. Moderate to severe stenosis of the posterior inferior right M2 segment. 4. Mild narrowing of the distal left M1 segment. 5. Mild to moderate narrowing of distal P2 segments bilaterally. 6. Diffuse distal small vessel disease evident on the MRA. Electronically Signed   By: Marin Roberts M.D.   On: 01/31/2016 19:13   Mr Brain Wo Contrast  01/31/2016  CLINICAL DATA:  Seizures.  Abnormal CT. EXAM: MRI HEAD WITHOUT CONTRAST MRA HEAD WITHOUT CONTRAST TECHNIQUE: Multiplanar, multiecho pulse sequences of the brain and surrounding structures were obtained without intravenous contrast. Angiographic images of the head were obtained using MRA technique without contrast. COMPARISON:  None. FINDINGS: MRI HEAD FINDINGS Areas of restricted diffusion trace signal abnormality are present in the right centrum semi ovale and right occipital lobe. There is no definite restricted diffusion in the occipital lobe. There is minimal restricted diffusion in the right centrum semi ovale. Extensive white matter changes are present bilaterally. Focal white matter changes correspond with the areas of diffusion trace signal in the right centrum semi ovale and right occipital lobe. The right occipital area appears represent remote encephalomalacia. Remote lacunar  infarcts are present in the basal ganglia and right thalamus. The brainstem and cerebellum are normal. The internal auditory canals are within normal limits bilaterally. Flow is present in the major intracranial arteries. The globes and orbits are intact. A fluid level is present in the left maxillary sinus. Mild mucosal thickening is present throughout the maxillary sinus. A fluid level is present in the nasopharynx. There is some fluid in the right mastoid air cells. The skullbase is otherwise within normal limits. Midline sagittal images are unremarkable. MRA HEAD FINDINGS Atherosclerotic  changes are present within the cavernous internal carotid arteries bilaterally without focal stenosis. The A1 and M1 segments are normal. There is mild narrowing of the distal left M1 segment. Signal loss in the posterior right M2 segment suggests severe stenosis. The superior segment is not well seen. There is significant attenuation of MCA branch vessels bilaterally. The right vertebral artery is slightly dominant to the left. The PICA origin is visualized. Atherosclerotic changes are present throughout the the basilar artery without focal stenosis. Both posterior cerebral arteries originate the basilar tip. Moderate narrowing is present in the distal P2 segments bilaterally. The distal PCA branch vessels are poorly visualized. IMPRESSION: 1. Focal area of remote encephalomalacia involving the right occipital lobe. T2 shine through is evident on the diffusion-weighted images. 2. Indeterminate area of diffusion abnormality in the right centrum semi ovale. This may also represent T2 shine through or a late subacute infarct. Repeat MRI in 1-2 months could be used for further evaluation if clinically indicated. 3. Moderate to severe stenosis of the posterior inferior right M2 segment. 4. Mild narrowing of the distal left M1 segment. 5. Mild to moderate narrowing of distal P2 segments bilaterally. 6. Diffuse distal small vessel disease evident on the MRA. Electronically Signed   By: Marin Roberts M.D.   On: 01/31/2016 19:13   Dg Chest Port 1 View  02/05/2016  CLINICAL DATA:  Respiratory failure, NSTEMI, CHF. EXAM: PORTABLE CHEST 1 VIEW COMPARISON:  Portable chest x-ray of Feb 04, 2016 FINDINGS: There is been interval extubation of the trachea and of the esophagus. The left lung is well-expanded. The right hemidiaphragm remains elevated. The pulmonary interstitial markings have improved especially on the right. The cardiac silhouette remains enlarged. The central pulmonary vascularity is mildly engorged. The  right-sided PICC line tip projects over the midportion of the SVC. The bony thorax is unremarkable. IMPRESSION: Improving CHF. Persistent elevation of the right hemidiaphragm. Probable small right pleural effusion and minimal subsegmental atelectasis at the right lung base. Electronically Signed   By: David  [place] M.D.   On: 02/05/2016 07:18   Dg Chest Port 1 View  02/04/2016  CLINICAL DATA:  Tube placement. EXAM: PORTABLE CHEST 1 VIEW COMPARISON:  02/04/2016 and 02/03/2016 FINDINGS: Patient rotated to the left. Endotracheal tube has tip 5 cm above the carina. Enteric tube courses into the region of the stomach and off the inferior portion of the film as tip is not visualized. Interval placement of right-sided PICC line with tip at the cavoatrial junction. Lungs are adequately inflated with persistent hazy opacification over the central right lung which may be due to asymmetric edema versus infection. Small amount right pleural fluid. Hazy left retrocardiac opacification unchanged likely effusion with atelectasis versus infection. Stable cardiomegaly. Remainder the exam is unchanged. IMPRESSION: Persistent hazy right central lung opacification suggesting asymmetric edema versus infection. Stable small right effusion. Stable left retrocardiac opacification likely small effusion with atelectasis although cannot exclude infection. Stable cardiomegaly. Tubes and  lines as described. Electronically Signed   By: Elberta Fortis M.D.   On: 02/04/2016 15:16   Dg Chest Port 1 View  02/04/2016  CLINICAL DATA:  65 year old male with history of respiratory failure. EXAM: PORTABLE CHEST 1 VIEW COMPARISON:  Chest x-ray 02/03/2016. FINDINGS: An endotracheal tube is in place with tip 4.2 cm above the carina. A nasogastric tube is seen extending into the stomach, however, the tip of the nasogastric tube extends below the lower margin of the image. Lung volumes are low. Mild elevation of the right hemidiaphragm. Interstitial  prominence and ill-defined airspace disease throughout the right lung, most evident in the right lower lobe. Probable small right pleural effusion. Left lung appears relatively clear. Mild cardiomegaly. Upper mediastinal contours are within normal limits. IMPRESSION: 1. Support apparatus, as above. 2. Low lung volumes with worsening aeration throughout the right lung, the appearance of which is concerning for developing multilobar bronchopneumonia, most severe in the right lower lobe. 3. Probable small right pleural effusion. Electronically Signed   By: Trudie Reed M.D.   On: 02/04/2016 11:00   Dg Chest Port 1 View  02/03/2016  CLINICAL DATA:  Respiratory failure EXAM: PORTABLE CHEST 1 VIEW COMPARISON:  02/02/2016 FINDINGS: The endotracheal tube tip is 5.1 cm above the carina. The nasogastric tube extends into the stomach and beyond the inferior edge of the image. No dense consolidation. No large effusion. No pneumothorax. Mild linear basilar opacities persist, likely atelectatic. IMPRESSION: Support equipment appears satisfactorily positioned. No dense consolidation or large effusion. Electronically Signed   By: Ellery Plunk M.D.   On: 02/03/2016 03:38   Dg Chest Port 1 View  02/02/2016  CLINICAL DATA:  Hypoxia EXAM: PORTABLE CHEST 1 VIEW COMPARISON:  Feb 01, 2016 FINDINGS: Endotracheal tube tip is 2.5 cm above the carina. Nasogastric tube tip and side port are below the diaphragm. No pneumothorax. There is slight bibasilar atelectasis. Lungs elsewhere clear. Heart slightly enlarged with pulmonary vascularity within normal limits. No adenopathy. IMPRESSION: Tube positions as described without pneumothorax. Mild bibasilar atelectasis. Lungs elsewhere clear. Stable cardiac prominence. Electronically Signed   By: Bretta Bang III M.D.   On: 02/02/2016 08:02   Dg Chest Port 1 View  02/01/2016  CLINICAL DATA:  Check endotracheal tube placement EXAM: PORTABLE CHEST 1 VIEW COMPARISON:  01/31/2016  FINDINGS: Endotracheal tube appears have been advanced and now lies 2.4 cm above the carina. A nasogastric catheter is seen within the stomach. The proximal side port lies at the gastroesophageal junction. The cardiac shadow is stable. The lungs are well aerated bilaterally without focal infiltrate. No bony abnormality is seen. IMPRESSION: No acute abnormality noted. Electronically Signed   By: Alcide Clever M.D.   On: 02/01/2016 07:41   Dg Chest Port 1 View  01/31/2016  CLINICAL DATA:  Initial evaluation for endotracheal tube placement. EXAM: PORTABLE CHEST 1 VIEW COMPARISON:  Prior radiograph from earlier the same day. FINDINGS: The endotracheal tube has been advanced, with tip now positioned approximately 3.8 cm above the carina. Enteric tube in place with side hole the at or just beyond the GE junction. Consider advancement by I 3 cm. Cardiac mediastinal silhouettes are stable. Lungs are hypoinflated. Similar left upper lobe opacity. Right perihilar/basilar linear opacity favored to reflect atelectasis. No pulmonary edema or pleural effusion. No pneumothorax. No acute osseous abnormality. IMPRESSION: 1. Tip of the endotracheal to Ing could position approximately 3.8 cm above the carina. 2. Interval placement of enteric to with side hole at for just  beyond the GE junction. Consider advancement by approximately 3 cm to insure adequate placement within the stomach. 3. Similar patchy left upper lobe opacity, which may reflect atelectasis or infiltrate. 4. Mild right perihilar atelectasis. Electronically Signed   By: Rise Mu M.D.   On: 01/31/2016 05:56   Dg Chest Portable 1 View  01/31/2016  CLINICAL DATA:  Respiratory failure EXAM: PORTABLE CHEST 1 VIEW COMPARISON:  04/19/2014 FINDINGS: Endotracheal tube tip is above the level of the clavicles and should be advanced. I reported this by phone to Dr. Clarene Duke, and she had already seen this and advanced the tube. There is mild unchanged right  hemidiaphragm elevation. There is mild patchy opacity in the left upper lobe medially which could represent atelectasis, aspiration, infectious infiltrate or hemorrhage. No pneumothorax. No large effusion. IMPRESSION: ET tube is high, as described above. This has already been advanced by the attending physician. Mild patchy opacity in the medial left upper lobe. Electronically Signed   By: Ellery Plunk M.D.   On: 01/31/2016 03:58   US Abdomen Limited Ruq  02/08/2016  CLINICAL DATA:  History of hepatitis-C and alcohol abuse. Follow-up from previous ultrasound in 2013. EXAM: US ABDOMEN LIMITED - RIGHT UPPER QUADRANT COMPARISON:  04/30/2012 FINDINGS: Gallbladder: Multiple stones in the gallbladder, largest measuring about 1.5 cm. No gallbladder wall thickening or edema. Murphy's sign is negative. Similar appearance to prior study. Common bile duct: Diameter: 2.4 mm, normal Liver: No focal lesion identified. Within normal limits in parenchymal echogenicity. IMPRESSION: Cholelithiasis without change since prior study. Electronically Signed   By: Burman Nieves M.D.   On: 02/08/2016 03:45    Microbiology: Recent Results (from the past 240 hour(s))  Culture, blood (routine x 2)     Status: Abnormal   Collection Time: 01/31/16  5:49 AM  Result Value Ref Range Status   Specimen Description BLOOD LEFT ARM  Final   Special Requests BOTTLES DRAWN AEROBIC AND ANAEROBIC  Final   Culture  Setup Time   Final    GRAM POSITIVE COCCI IN CLUSTERS ANAEROBIC BOTTLE ONLY Organism ID to follow CRITICAL RESULT CALLED TO, READ BACK BY AND VERIFIED WITH: Edd Arbour D AT 1243 02/01/16 BY L BENFIELD    Culture (A)  Final    STAPHYLOCOCCUS SPECIES (COAGULASE NEGATIVE) THE SIGNIFICANCE OF ISOLATING THIS ORGANISM FROM A SINGLE SET OF BLOOD CULTURES WHEN MULTIPLE SETS ARE DRAWN IS UNCERTAIN. PLEASE NOTIFY THE MICROBIOLOGY DEPARTMENT WITHIN ONE WEEK IF SPECIATION AND SENSITIVITIES ARE REQUIRED.    Report Status  02/03/2016 FINAL  Final  Blood Culture ID Panel (Reflexed)     Status: Abnormal   Collection Time: 01/31/16  5:49 AM  Result Value Ref Range Status   Enterococcus species NOT DETECTED NOT DETECTED Final   Vancomycin resistance NOT DETECTED NOT DETECTED Final   Listeria monocytogenes NOT DETECTED NOT DETECTED Final   Staphylococcus species DETECTED (A) NOT DETECTED Final    Comment: CRITICAL RESULT CALLED TO, READ BACK BY AND VERIFIED WITH: Edd Arbour D AT 1243 02/01/16 BY L BENFIELD    Staphylococcus aureus NOT DETECTED NOT DETECTED Final   Methicillin resistance NOT DETECTED NOT DETECTED Final   Streptococcus species NOT DETECTED NOT DETECTED Final   Streptococcus agalactiae NOT DETECTED NOT DETECTED Final   Streptococcus pneumoniae NOT DETECTED NOT DETECTED Final   Streptococcus pyogenes NOT DETECTED NOT DETECTED Final   Acinetobacter baumannii NOT DETECTED NOT DETECTED Final   Enterobacteriaceae species NOT DETECTED NOT DETECTED Final   Enterobacter cloacae  complex NOT DETECTED NOT DETECTED Final   Escherichia coli NOT DETECTED NOT DETECTED Final   Klebsiella oxytoca NOT DETECTED NOT DETECTED Final   Klebsiella pneumoniae NOT DETECTED NOT DETECTED Final   Proteus species NOT DETECTED NOT DETECTED Final   Serratia marcescens NOT DETECTED NOT DETECTED Final   Carbapenem resistance NOT DETECTED NOT DETECTED Final   Haemophilus influenzae NOT DETECTED NOT DETECTED Final   Neisseria meningitidis NOT DETECTED NOT DETECTED Final   Pseudomonas aeruginosa NOT DETECTED NOT DETECTED Final   Candida albicans NOT DETECTED NOT DETECTED Final   Candida glabrata NOT DETECTED NOT DETECTED Final   Candida krusei NOT DETECTED NOT DETECTED Final   Candida parapsilosis NOT DETECTED NOT DETECTED Final   Candida tropicalis NOT DETECTED NOT DETECTED Final  Culture, blood (routine x 2)     Status: None   Collection Time: 01/31/16  5:54 AM  Result Value Ref Range Status   Specimen Description BLOOD  LEFT ARM  Final   Special Requests BOTTLES DRAWN AEROBIC AND ANAEROBIC  Final   Culture NO GROWTH 5 DAYS  Final   Report Status 02/06/2016 FINAL  Final  MRSA PCR Screening     Status: None   Collection Time: 01/31/16  6:34 AM  Result Value Ref Range Status   MRSA by PCR NEGATIVE NEGATIVE Final    Comment:        The GeneXpert MRSA Assay (FDA approved for NASAL specimens only), is one component of a comprehensive MRSA colonization surveillance program. It is not intended to diagnose MRSA infection nor to guide or monitor treatment for MRSA infections.   Urine culture     Status: None   Collection Time: 01/31/16  6:51 AM  Result Value Ref Range Status   Specimen Description URINE, CATHETERIZED  Final   Special Requests Normal  Final   Culture NO GROWTH  Final   Report Status 02/01/2016 FINAL  Final     Labs: Basic Metabolic Panel:  Recent Labs Lab 02/03/16 0418 02/04/16 0430 02/05/16 0400 02/06/16 0336 02/07/16 0415 02/08/16 0415 02/09/16 0400  NA 143 143 144 144 142 138 140  K 3.5 3.8 3.7 3.0* 3.1* 3.7 3.5  CL 110 111 109 103 101 102 101  CO2 22 21* 25 28 29 26 28   GLUCOSE 118* 143* 148* 112* 102* 108* 101*  BUN 27* 26* 26* 24* 22* 17 20  CREATININE 1.66* 1.58* 1.43* 1.56* 1.47* 1.21 1.41*  CALCIUM 8.1* 8.6* 8.6* 8.9 8.7* 8.6* 8.9  MG 1.9 2.3 1.8 1.7 2.0 1.8 2.0  PHOS 2.8 2.7 3.6 3.1 3.3  --   --    Liver Function Tests: No results for input(s): AST, ALT, ALKPHOS, BILITOT, PROT, ALBUMIN in the last 168 hours. No results for input(s): LIPASE, AMYLASE in the last 168 hours. No results for input(s): AMMONIA in the last 168 hours. CBC:  Recent Labs Lab 02/04/16 0312 02/04/16 0430 02/05/16 0400 02/06/16 0336 02/07/16 0415 02/08/16 0415 02/09/16 0400  WBC 4.3 7.8 14.5* 12.0* 13.3* 12.3* 10.9*  NEUTROABS 3.4 6.3 12.6* 10.1* 11.5*  --   --   HGB 6.7* 12.4* 13.1 12.6* 12.2* 11.9* 13.1  HCT 22.3* 38.9* 39.3 39.5 37.5* 36.7* 39.9  MCV 104.2* 102.9* 101.8*  102.1* 101.4* 99.7 97.6  PLT 47* 100* 131* 125* 138* 130* 134*   Cardiac Enzymes:  Recent Labs Lab 02/04/16 0430 02/05/16 0400  TROPONINI 2.37* 1.61*   BNP: BNP (last 3 results)  Recent Labs  02/04/16 1112  BNP 563.8*    ProBNP (last 3 results) No results for input(s): PROBNP in the last 8760 hours.  CBG:  Recent Labs Lab 02/05/16 2014 02/05/16 2354 02/06/16 0335 02/06/16 0853 02/09/16 0748  GLUCAP 99 135* 112* 129* 96       Signed:  Carolyne Littles, MD [facility] [phone] pager

## 2016-02-21 ENCOUNTER — Ambulatory Visit (HOSPITAL_COMMUNITY)
Admit: 2016-02-21 | Discharge: 2016-02-21 | Disposition: A | Discharge status: Home / Self Care | Payer: 59 | Source: Ambulatory Visit | Attending: Internal Medicine | Admitting: Internal Medicine

## 2016-02-21 VITALS — BP 136/80 | HR 75 | Wt 209.2 lb

## 2016-02-21 DIAGNOSIS — I4821 Permanent atrial fibrillation: Secondary | ICD-10-CM

## 2016-02-21 DIAGNOSIS — I130 Hypertensive heart and chronic kidney disease with heart failure and stage 1 through stage 4 chronic kidney disease, or unspecified chronic kidney disease: Secondary | ICD-10-CM | POA: Diagnosis not present

## 2016-02-21 DIAGNOSIS — Z79899 Other long term (current) drug therapy: Secondary | ICD-10-CM | POA: Insufficient documentation

## 2016-02-21 DIAGNOSIS — Z7901 Long term (current) use of anticoagulants: Secondary | ICD-10-CM | POA: Insufficient documentation

## 2016-02-21 DIAGNOSIS — I2510 Atherosclerotic heart disease of native coronary artery without angina pectoris: Secondary | ICD-10-CM

## 2016-02-21 DIAGNOSIS — F1910 Other psychoactive substance abuse, uncomplicated: Secondary | ICD-10-CM | POA: Diagnosis not present

## 2016-02-21 DIAGNOSIS — I5022 Chronic systolic (congestive) heart failure: Secondary | ICD-10-CM

## 2016-02-21 DIAGNOSIS — I252 Old myocardial infarction: Secondary | ICD-10-CM | POA: Insufficient documentation

## 2016-02-21 DIAGNOSIS — Z8673 Personal history of transient ischemic attack (TIA), and cerebral infarction without residual deficits: Secondary | ICD-10-CM | POA: Diagnosis not present

## 2016-02-21 DIAGNOSIS — Z87891 Personal history of nicotine dependence: Secondary | ICD-10-CM | POA: Insufficient documentation

## 2016-02-21 DIAGNOSIS — I482 Chronic atrial fibrillation: Secondary | ICD-10-CM | POA: Diagnosis not present

## 2016-02-21 DIAGNOSIS — N183 Chronic kidney disease, stage 3 (moderate): Secondary | ICD-10-CM | POA: Diagnosis not present

## 2016-02-21 MED ORDER — SACUBITRIL-VALSARTAN 49-51 MG PO TABS: 1.0000 | ORAL_TABLET | Freq: Two times a day (BID) | ORAL | Status: DC

## 2016-02-21 NOTE — Progress Notes (Signed)
Patient ID: Kevin [name], male   DOB: 1951/02/22, 65 y.o.   MRN: [id number]   [facility] CONSULT NOTE  Referring Physician: Primary Care: Primary Cardiologist: Hilty  HPI:  Kevin [name] is a 65 year old male with a past medical history of PAD, CAD, severe HTN, polysubstance abuse (cocaine and ETOH) January 2017, persistent Afib (on Eliquis), TIA and Hepatitis C. Referred to HF Clinic by Dr. Carolyne Littles.   He presented to the ED via EMS on 01/31/16 early in the morning after he woke up vomiting and had an episode of incontinence. [name] noted that he was non verbal, she called EMS. When he arrived to the ED he began seizing. Code stroke was called. CT of head showed equivocal subacute right occipital infarction. No hemorrhage. Had elevated troponin of 0.75 initially, it has increased to 17.54. Cardiology was consulted. EKG when patient was admitted showed Afib RVR with rate of 170. Repeat EKG shows Afib with rate of 106 with T wave inversion in inferolateral leads, this is also noted in an EKG from 07/2015. Echo EF 35-40% with LVH (I looked at echo and feel EF more like 45-50% with AF with RVR). No cath performed. Was intubated initially in the hospital for airway protection. Discharged 02/09/2016.    He had an intermediate risk Lexiscan Myoview in December 2015, that showed moderate area of anterolateral/lateral wall ischemia. He went for a left heart cath a month later in January 2016 (report below), and he had 90% tubular mid-ramus stenosis and 70% bifurcation PDA/PLB stenosis.  Here with his wife. Says he feels pretty good. Gets very fatigue after taking his meds. Very sedentary. Now doing HHPT. Denies CP. No edema. No dizziness. SOB with mild activity. + occasional orthopnea.    CARDIAC CATHETERIZATION REPORT 09/27/14 Coronary Angiographic Data:  Left Main: Long vessel, angiographically normal, trifurcates into LAD, LCX and Ramus branches  Left Anterior Descending  (LAD): There is a proximal 20-30% eccentric, hazy plaque and mild mid-LAD stenosis, vessel reaches the apex  1st diagonal (D1): Smaller branch, mild diffuse disease  Circumflex (LCx): AV groove vessel, gives off 2 OM branches, mild ostial stenosis.  1st obtuse marginal: Smaller vessel, no stenosis.  2nd obtuse marginal: Minimal luminal irregularities  Ramus Intermedius: There is a tubular 90% mid-Ramus stenosis, the vessel coarses to the lateral wall  Right Coronary Artery: Dominant artery. Mild luminal irregularities.  right ventricle branch of right coronary artery: No stenosis.  posterior descending artery: There is a 70% ostial bifurcation stenosis, however, this is <2.0 mm in diameter  posterior lateral branch: Ostial stenosis (see above)  Review of Systems: [y] = yes, [ ]  = no   General: Weight gain [ ] ; Weight loss [ ] ; Anorexia [ ] ; Fatigue Cove.Etienne ]; Fever [ ] ; Chills [ ] ; Weakness [ ]   Cardiac: Chest pain/pressure [ ] ; Resting SOB [ ] ; Exertional SOB [ y]; Orthopnea [ ] y; Pedal Edema [ ] ; Palpitations [ ] ; Syncope [ ] ; Presyncope [ ] ; Paroxysmal nocturnal dyspnea[ ]   Pulmonary: Cough [ ] ; Wheezing[ ] ; Hemoptysis[ ] ; Sputum [ ] ; Snoring [ ]   GI: Vomiting[ ] ; Dysphagia[ ] ; Melena[ ] ; Hematochezia [ ] ; Heartburn[ ] ; Abdominal pain [ ] ; Constipation [ ] ; Diarrhea [ ] ; BRBPR [ ]   GU: Hematuria[ ] ; Dysuria [ ] ; Nocturia[ ]   Vascular: Pain in legs with walking [ ] ; Pain in feet with lying flat [ ] ; Non-healing sores [ ] ; Stroke Cove.Etienne ]; TIA Cove.Etienne ]; Slurred speech [ ] ;  Neuro: Headaches[ ] ; Vertigo[ ] ; Seizures[y ]; Paresthesias[ ] ;Blurred vision [ ] ; Diplopia [ ] ; Vision changes [ ]   Ortho/Skin: Arthritis Cove.Etienne ]; Joint pain [ y]; Muscle pain [ ] ; Joint swelling [ ] ; Back Pain [ ] ; Rash [ ]   Psych: Depression[y ]; Anxiety[ ]   Heme: Bleeding problems [ ] ; Clotting disorders [ ] ; Anemia [ ]   Endocrine: Diabetes [ ] ; Thyroid dysfunction[ ]    Past Medical History  Diagnosis Date  . PAD  (peripheral artery disease) (HCC)   . Hypertension   . Cocaine abuse   . ETOH abuse   . Tobacco abuse   . Cholelithiases 05/01/2012  . Dysrhythmia     afib  . CHF (congestive heart failure) (HCC)   . Blood transfusion without reported diagnosis   . Hepatitis C antibody test positive 04/2012  . Claudication (HCC)     bilateral  . Hyperpigmentation   . Thrombocytopenia (HCC)   . Persistent atrial fibrillation (HCC) 08/23/2014  . CAD in native artery cath 2016 90% mRamus  02/01/2016  . Atrial fibrillation, permanent (HCC) 02/01/2016    Current Outpatient Prescriptions  Medication Sig Dispense Refill  . apixaban (ELIQUIS) 5 MG TABS tablet Take 1 tablet (5 mg total) by mouth 2 (two) times daily. 120 tablet 0  . buPROPion (WELLBUTRIN SR) 150 MG 12 hr tablet Take 1 tablet (150 mg total) by mouth 2 (two) times daily. 60 tablet 0  . cloNIDine (CATAPRES) 0.2 MG tablet Take 1 tablet (0.2 mg total) by mouth 2 (two) times daily. 60 tablet 0  . diltiazem (CARDIZEM CD) 240 MG 24 hr capsule Take 1 capsule (240 mg total) by mouth daily. 90 capsule 0  . folic acid (FOLVITE) 1 MG tablet Take 1 tablet (1 mg total) by mouth daily. Need appointment before anymore refills 30 tablet 1  . furosemide (LASIX) 40 MG tablet Take 1 tablet (40 mg total) by mouth daily. 30 tablet 0  . levETIRAcetam (KEPPRA) 1000 MG tablet Take 1 tablet (1,000 mg total) by mouth 2 (two) times daily. 60 tablet 0  . metoprolol (LOPRESSOR) 100 MG tablet Take 1 tablet (100 mg total) by mouth 2 (two) times daily. 60 tablet 0  . Multiple Vitamin (MULTIVITAMIN WITH MINERALS) TABS tablet Take 1 tablet by mouth daily. Men's One a Day    . pantoprazole (PROTONIX) 40 MG tablet Take 1 tablet (40 mg total) by mouth at bedtime. 30 tablet 0  . potassium chloride SA (K-DUR,KLOR-CON) 20 MEQ tablet Take 1 tablet (20 mEq total) by mouth daily. 30 tablet 0  . rosuvastatin (CRESTOR) 20 MG tablet Take 1 tablet (20 mg total) by mouth daily. (Patient taking  differently: Take 20 mg by mouth at bedtime. ) 30 tablet 0   No current facility-administered medications for this encounter.    No Known Allergies    Social History   Social History  . Marital Status: Married    Spouse Name: Stefano Gaul  . Number of Children: 5  . Years of Education: 12   Occupational History  . Retired Arts administrator   Social History Main Topics  . Smoking status: Current Every Day Smoker -- 0.25 packs/day for 40 years    Types: Cigarettes  . Smokeless tobacco: Never Used  . Alcohol Use: 0.0 - 9.6 oz/week    0-16 Standard drinks or equivalent per week     Comment: He used to drink up to 1/2 pint daily; 6 beers. Quit in March 2015  . Drug Use: No  Comment: cocaine, marijuana  . Sexual Activity:    Partners: Female   Other Topics Concern  . Not on file   Social History Narrative   Lives with his wife.  Was raised by a non-related family from the age of 14 years, and so knows very little about his biological family history.      Family History  Problem Relation Age of Onset  . Seizures Daughter   . Cancer Mother     Ceasar Mons Vitals:   02/21/16 1434  BP: 136/80  Pulse: 75  Weight: 209 lb 4 oz (94.915 kg)  SpO2: 98%    PHYSICAL EXAM: General:  Sitting in chair. No respiratory difficulty. Hoarse voice HEENT: normal Neck: supple. JVO 7  Carotids 2+ bilat; no bruits. No lymphadenopathy or thryomegaly appreciated. Cor: PMI nondisplaced. Irreg, irreg . No rubs, gallops or murmurs. Lungs: clear Abdomen: soft, nontender, nondistended. No hepatosplenomegaly. No bruits or masses. Good bowel sounds. Extremities: no cyanosis, clubbing, rash, edema exstensive scar RLE Neuro: alert & oriented x 3, cranial nerves grossly intact. moves all 4 extremities w/o difficulty. Affect pleasant.   ASSESSMENT & PLAN: 1. Chronic systolic HF    --EF 35-40% by echo 5/17   --NYHA III  2. Chronic AF 3. CAD with recent NSTEMI 4. Severe HTN  5. Polysubstance abuse  6. Tobacco  use quit 5/17  7. CKD stage III  There are multiple cardiac issues as above. I reviewed cath films and echo images personally. EF reported 35-40% recently. Has NYHA III symptoms with minimal volume overload. Will begin to wean clonidine and stop over next few days. Start Entresto 49/51 bid. Will eventually also want to switch diltiazem to norvasc, add spiro and switch lopressor to carvedilol but this can be done in stepwise fashion with frequent visits. Will complete HHPT and then refer to cardiac rehab. May need repeat cath at some point but will hold off for now. No ASA with Eliquis. Continue bb and statin. Continue Eliquis for chronic AF.   Total time spent 45 minutes. Over half that time spent discussing above.    Bensimhon, Daniel,MD 3:23 PM

## 2016-02-21 NOTE — Addendum Note (Signed)
Encounter addended by: Noralee Space, RN on: 02/21/2016  3:40 PM<BR>     Documentation filed: Medications, Dx Association, Patient Instructions Section, Orders

## 2016-02-21 NOTE — Patient Instructions (Signed)
Decrease Clonidine to 0.1 mg (1/2 tab) Twice daily FOR 3 DAYS ONLY THEN STOP  Start Entresto 49/51 mg Twice daily   Your physician has requested that you have an echocardiogram. Echocardiography is a painless test that uses sound waves to create images of your heart. It provides your doctor with information about the size and shape of your heart and how well your heart's chambers and valves are working. This procedure takes approximately one hour. There are no restrictions for this procedure.  You have been referred to Fara Chute D  Your physician recommends that you schedule a follow-up appointment in: 1 month

## 2016-02-23 ENCOUNTER — Telehealth (HOSPITAL_COMMUNITY): Payer: Self-pay | Admitting: *Deleted

## 2016-02-23 NOTE — Telephone Encounter (Signed)
Completed PA for pt's Sherryll Burger, med approved 01/22/16-02/20/17, case # [id number], [facility] aware

## 2016-03-06 ENCOUNTER — Encounter (HOSPITAL_COMMUNITY): Payer: Self-pay

## 2016-03-06 ENCOUNTER — Ambulatory Visit (HOSPITAL_BASED_OUTPATIENT_CLINIC_OR_DEPARTMENT_OTHER)
Admission: RE | Admit: 2016-03-06 | Discharge: 2016-03-06 | Disposition: A | Discharge status: Home / Self Care | Payer: Managed Care, Other (non HMO) | Source: Ambulatory Visit | Attending: Internal Medicine | Admitting: Internal Medicine

## 2016-03-06 ENCOUNTER — Ambulatory Visit (HOSPITAL_COMMUNITY)
Admission: RE | Admit: 2016-03-06 | Discharge: 2016-03-06 | Disposition: A | Discharge status: Home / Self Care | Payer: Managed Care, Other (non HMO) | Source: Ambulatory Visit | Attending: Internal Medicine | Admitting: Internal Medicine

## 2016-03-06 VITALS — BP 140/100 | HR 75 | Wt 208.0 lb

## 2016-03-06 DIAGNOSIS — I482 Chronic atrial fibrillation: Secondary | ICD-10-CM | POA: Diagnosis not present

## 2016-03-06 DIAGNOSIS — I5023 Acute on chronic systolic (congestive) heart failure: Secondary | ICD-10-CM

## 2016-03-06 DIAGNOSIS — I5022 Chronic systolic (congestive) heart failure: Secondary | ICD-10-CM | POA: Diagnosis not present

## 2016-03-06 DIAGNOSIS — I509 Heart failure, unspecified: Secondary | ICD-10-CM | POA: Diagnosis present

## 2016-03-06 DIAGNOSIS — I340 Nonrheumatic mitral (valve) insufficiency: Secondary | ICD-10-CM | POA: Insufficient documentation

## 2016-03-06 DIAGNOSIS — R29898 Other symptoms and signs involving the musculoskeletal system: Secondary | ICD-10-CM | POA: Insufficient documentation

## 2016-03-06 DIAGNOSIS — I130 Hypertensive heart and chronic kidney disease with heart failure and stage 1 through stage 4 chronic kidney disease, or unspecified chronic kidney disease: Secondary | ICD-10-CM | POA: Insufficient documentation

## 2016-03-06 DIAGNOSIS — I252 Old myocardial infarction: Secondary | ICD-10-CM | POA: Diagnosis not present

## 2016-03-06 DIAGNOSIS — N183 Chronic kidney disease, stage 3 (moderate): Secondary | ICD-10-CM | POA: Insufficient documentation

## 2016-03-06 DIAGNOSIS — I2510 Atherosclerotic heart disease of native coronary artery without angina pectoris: Secondary | ICD-10-CM | POA: Insufficient documentation

## 2016-03-06 LAB — BASIC METABOLIC PANEL
Anion gap: 7 (ref 5–15)
BUN: 17 mg/dL (ref 6–20)
CO2: 23 mmol/L (ref 22–32)
Calcium: 8.7 mg/dL — ABNORMAL LOW (ref 8.9–10.3)
Chloride: 108 mmol/L (ref 101–111)
Creatinine, Ser: 1.47 mg/dL — ABNORMAL HIGH (ref 0.61–1.24)
GFR calc Af Amer: 56 mL/min — ABNORMAL LOW (ref >60)
GFR calc non Af Amer: 49 mL/min — ABNORMAL LOW (ref >60)
Glucose, Bld: 100 mg/dL — ABNORMAL HIGH (ref 65–99)
Potassium: 4.2 mmol/L (ref 3.5–5.1)
Sodium: 138 mmol/L (ref 135–145)

## 2016-03-06 LAB — ECHOCARDIOGRAM COMPLETE
E decel time: 243 msec
FS: 31 % (ref 28–44)
IVS/LV PW RATIO, ED: 0.99
LA ID, A-P, ES: 46 mm
LA diam end sys: 46 mm
LA diam index: 2.16 cm/m2
LA vol A4C: 90.6 ml
LA vol index: 45.4 mL/m2
LA vol: 96.7 mL
LV PW d: 14.4 mm — AB (ref 0.6–1.1)
LVOT SV: 30 mL
LVOT VTI: 9.58 cm
LVOT area: 3.14 cm2
LVOT diameter: 20 mm
LVOT peak vel: 59.9 cm/s
MV Dec: 243
MV pk A vel: 34.4 m/s
MV pk E vel: 66.9 m/s
TAPSE: 19.4 mm

## 2016-03-06 MED ORDER — CARVEDILOL 25 MG PO TABS: 25.0000 mg | ORAL_TABLET | Freq: Two times a day (BID) | ORAL | Status: DC

## 2016-03-06 NOTE — Patient Instructions (Signed)
It was great to see you today!   STOP taking the metoprolol and START taking the Carvedilol 25mg  TWICE DAILY.   Labs today. Will call you with any abnormalities.  PLEASE KEEP YOUR APPOINTMENT WITH DR. Gala Romney ON 04/15/16.

## 2016-03-06 NOTE — Progress Notes (Signed)
HPI: [name] is a 65 year old AA male with a past medical history of PAD, CAD, severe HTN, polysubstance abuse (cocaine and ETOH) January 2017, persistent Afib (on Eliquis), TIA and Hepatitis C. Referred to HF Clinic by Dr. Carolyne Littles.   Recently presented to ED with stroke and AFib w/ RVR. Echo EF 35-40% with LVH (DB looked at echo and feel EF more like 45-50% with AF with RVR). No cath performed.  Discharged 02/09/2016.  Presents to PharmD Clinic with his wife for HF medication titration. States he feels pretty good.  Previous HF clinic visit, clonidine was discontinued and entresto 49-51mg  started.  Previously very sedentary but now going on morning walks. Denies CP. No edema. No dizziness. SOB with moderate activity. Getting ready to transition from Home Health PT to Cardiac Rehab.   . Shortness of breath/dyspnea on exertion? no  . Orthopnea/PND? no . Edema? no . Lightheadedness/dizziness? no . Daily weights at home? Yes, states scale broke, given a new scale in clinic today. (weights at home ~208lbs) . Blood pressure/heart rate monitoring at home? Yes, per Home Health Monitoring and states "within normal range"  . Following low-sodium/fluid-restricted diet? yes  HF Medications: Metoprolol tartrate 100mg  BID Furosemide 40mg  daily Entresto 49-51mg  BID   Has the patient been experiencing any side effects to the medications prescribed?  no  Does the patient have any problems obtaining medications due to transportation or finances?   no  Understanding of regimen: good Understanding of indications: good Potential of compliance: good Patient understands to avoid NSAIDs. Patient understands to avoid decongestants.    Pertinent Lab Values: . 03/06/16: Serum creatinine stable 1.47 (BL ~1.2), CO2 23, Potassium 4.2, Sodium 138 . 02/09/16: Magnesium 2.0 . 02/04/16 BNP: 563   Vital Signs: . Weight: 208lb (last clinic weight 209lbs) . Blood pressure: 140/100 mmHg . Heart rate: 75  bpm   Heart Failure Assessment & Plan: . Ejection fraction: 35-40% . NYHA symptom class: II-III . Based on clinical presentation, vital signs, and recent labs, will recommend to switch metoprolol to carvedilol 25mg  BID.   Summary: 1. Chronic systolic HF   --EF 35-40% by echo 03/06/16  --NYHA III    -- Continue lasix 40mg  Daily, Entresto 49-51mg  BID and KCl daily.    --Switch metoprolol to carvedilol 25mg  BID for better BP control     --Consider addition of spiro at next clinic visit  2. Chronic AF - continue eliquis 5mg  BID - rate controlled on diltiazem 240mg  daily and switching metoprolol to carvedilol.  3. CAD with recent NSTEMI 4. Severe HTN - continue meds as above and switch metoprolol to carvedilol  - Consider addition of spiro. On next clinic visit  5. Polysubstance abuse -  6. Tobacco use quit 5/17  7. CKD stage III  1) Medication changes: Switch metoprolol to carvedilol 25mg  BID  2) Labs: BMET today stable 3) Follow-up: 04/15/16 with Dr. Gala Romney  Seen with Kevin [name], PharmD Resident   Kevin [name]. Bonnye Fava, PharmD, BCPS, CPP Clinical Pharmacist Pager: [phone] Phone: [phone] 03/06/2016 3:20 PM  Agree.  Bensimhon, Daniel,MD 11:15 PM

## 2016-03-06 NOTE — Progress Notes (Signed)
  Echocardiogram 2D Echocardiogram has been performed.  Kevin [name] 03/06/2016, 3:01 PM

## 2016-03-08 ENCOUNTER — Telehealth (HOSPITAL_COMMUNITY): Payer: Self-pay

## 2016-03-08 NOTE — Telephone Encounter (Signed)
PA approval for eliquis 5mg  BID on 03/08/16 through [employer].   Approval good through 03/08/17.   Cal Gindlesperger C. Marvis Moeller, PharmD Pharmacy Resident  Pager: [phone] 03/08/2016 3:38 PM

## 2016-03-13 ENCOUNTER — Other Ambulatory Visit (HOSPITAL_COMMUNITY): Payer: Self-pay | Admitting: *Deleted

## 2016-03-13 MED ORDER — PANTOPRAZOLE SODIUM 40 MG PO TBEC: 40.0000 mg | DELAYED_RELEASE_TABLET | Freq: Every day | ORAL | Status: DC

## 2016-03-13 MED ORDER — LEVETIRACETAM 1000 MG PO TABS: 1000.0000 mg | ORAL_TABLET | Freq: Two times a day (BID) | ORAL | Status: DC

## 2016-03-13 MED ORDER — BUPROPION HCL ER (SR) 150 MG PO TB12: 150.0000 mg | ORAL_TABLET | Freq: Two times a day (BID) | ORAL | Status: DC

## 2016-03-30 ENCOUNTER — Other Ambulatory Visit (HOSPITAL_COMMUNITY): Payer: Self-pay | Admitting: Internal Medicine

## 2016-04-03 ENCOUNTER — Telehealth: Payer: Self-pay | Admitting: Emergency Medicine

## 2016-04-03 NOTE — Telephone Encounter (Deleted)
[facility] called and needs a refill on patients prescription for Clonidine. Looks like it was discontinued. Please follow up thanks.

## 2016-04-09 ENCOUNTER — Other Ambulatory Visit (HOSPITAL_COMMUNITY): Payer: Self-pay | Admitting: Internal Medicine

## 2016-04-15 ENCOUNTER — Ambulatory Visit (HOSPITAL_COMMUNITY)
Admission: RE | Admit: 2016-04-15 | Discharge: 2016-04-15 | Disposition: A | Discharge status: Home / Self Care | Payer: 59 | Source: Ambulatory Visit | Attending: Internal Medicine | Admitting: Internal Medicine

## 2016-04-15 ENCOUNTER — Telehealth (HOSPITAL_COMMUNITY): Payer: Self-pay | Admitting: Pharmacist

## 2016-04-15 ENCOUNTER — Encounter (HOSPITAL_COMMUNITY): Payer: Self-pay | Admitting: Internal Medicine

## 2016-04-15 VITALS — BP 126/82 | HR 74 | Wt 222.5 lb

## 2016-04-15 DIAGNOSIS — Z87891 Personal history of nicotine dependence: Secondary | ICD-10-CM | POA: Diagnosis not present

## 2016-04-15 DIAGNOSIS — F1410 Cocaine abuse, uncomplicated: Secondary | ICD-10-CM | POA: Diagnosis not present

## 2016-04-15 DIAGNOSIS — Z8673 Personal history of transient ischemic attack (TIA), and cerebral infarction without residual deficits: Secondary | ICD-10-CM | POA: Insufficient documentation

## 2016-04-15 DIAGNOSIS — I10 Essential (primary) hypertension: Secondary | ICD-10-CM

## 2016-04-15 DIAGNOSIS — I739 Peripheral vascular disease, unspecified: Secondary | ICD-10-CM

## 2016-04-15 DIAGNOSIS — I130 Hypertensive heart and chronic kidney disease with heart failure and stage 1 through stage 4 chronic kidney disease, or unspecified chronic kidney disease: Secondary | ICD-10-CM | POA: Insufficient documentation

## 2016-04-15 DIAGNOSIS — I252 Old myocardial infarction: Secondary | ICD-10-CM | POA: Insufficient documentation

## 2016-04-15 DIAGNOSIS — Z7901 Long term (current) use of anticoagulants: Secondary | ICD-10-CM | POA: Diagnosis not present

## 2016-04-15 DIAGNOSIS — I480 Paroxysmal atrial fibrillation: Secondary | ICD-10-CM

## 2016-04-15 DIAGNOSIS — I5022 Chronic systolic (congestive) heart failure: Secondary | ICD-10-CM

## 2016-04-15 DIAGNOSIS — N183 Chronic kidney disease, stage 3 unspecified: Secondary | ICD-10-CM

## 2016-04-15 DIAGNOSIS — I482 Chronic atrial fibrillation: Secondary | ICD-10-CM | POA: Diagnosis not present

## 2016-04-15 DIAGNOSIS — Z79899 Other long term (current) drug therapy: Secondary | ICD-10-CM | POA: Insufficient documentation

## 2016-04-15 DIAGNOSIS — I2510 Atherosclerotic heart disease of native coronary artery without angina pectoris: Secondary | ICD-10-CM | POA: Diagnosis not present

## 2016-04-15 DIAGNOSIS — F1010 Alcohol abuse, uncomplicated: Secondary | ICD-10-CM | POA: Insufficient documentation

## 2016-04-15 LAB — BASIC METABOLIC PANEL
Anion gap: 7 (ref 5–15)
BUN: 19 mg/dL (ref 6–20)
CO2: 24 mmol/L (ref 22–32)
Calcium: 8.6 mg/dL — ABNORMAL LOW (ref 8.9–10.3)
Chloride: 104 mmol/L (ref 101–111)
Creatinine, Ser: 1.31 mg/dL — ABNORMAL HIGH (ref 0.61–1.24)
GFR calc Af Amer: >60 mL/min (ref >60)
GFR calc non Af Amer: 56 mL/min — ABNORMAL LOW (ref >60)
Glucose, Bld: 123 mg/dL — ABNORMAL HIGH (ref 65–99)
Potassium: 4.3 mmol/L (ref 3.5–5.1)
Sodium: 135 mmol/L (ref 135–145)

## 2016-04-15 LAB — CBC
HCT: 43 % (ref 39.0–52.0)
Hemoglobin: 14.4 g/dL (ref 13.0–17.0)
MCH: 32.7 pg (ref 26.0–34.0)
MCHC: 33.5 g/dL (ref 30.0–36.0)
MCV: 97.7 fL (ref 78.0–100.0)
Platelets: 79 10*3/uL — ABNORMAL LOW (ref 150–400)
RBC: 4.4 MIL/uL (ref 4.22–5.81)
RDW: 12.2 % (ref 11.5–15.5)
WBC: 10 10*3/uL (ref 4.0–10.5)

## 2016-04-15 LAB — BRAIN NATRIURETIC PEPTIDE: B Natriuretic Peptide: 808.4 pg/mL — ABNORMAL HIGH (ref 0.0–100.0)

## 2016-04-15 MED ORDER — SACUBITRIL-VALSARTAN 97-103 MG PO TABS: 1.0000 | ORAL_TABLET | Freq: Two times a day (BID) | ORAL | 3 refills | Status: DC

## 2016-04-15 NOTE — Patient Instructions (Signed)
Stop Diltiazem   Increase Entresto to 97/103 mg Twice daily   Labs today  Your physician has requested that you have an ankle brachial index (ABI). During this test an ultrasound and blood pressure cuff are used to evaluate the arteries that supply the arms and legs with blood. Allow thirty minutes for this exam. There are no restrictions or special instructions.  Your physician recommends that you schedule a follow-up appointment in: 2 months

## 2016-04-15 NOTE — Progress Notes (Signed)
Patient ID: ZAYVIER SOLIVEN, male   DOB: 16-Jun-1951, 65 y.o.   MRN: [id number]   ADVANCED HF CLINIC NOTE  PCP: Osei-Bonsu Primary Cardiologist: Hilty  HPI:  Mr. Fennewald is a 65 year old male with a past medical history of PAD, CAD, severe HTN, polysubstance abuse (cocaine and ETOH) January 2017, persistent Afib (on Eliquis), TIA and Hepatitis C. Referred to HF Clinic by Dr. Carolyne Littles.   He presented to the ED via EMS on 01/31/16 early in the morning after he woke up vomiting and had an episode of incontinence. [name] noted that he was non verbal, she called EMS. When he arrived to the ED he began seizing. Code stroke was called. CT of head showed equivocal subacute right occipital infarction. No hemorrhage. Had elevated troponin of 0.75 initially, it has increased to 17.54. Cardiology was consulted. EKG when patient was admitted showed Afib RVR with rate of 170. Repeat EKG shows Afib with rate of 106 with T wave inversion in inferolateral leads, this is also noted in an EKG from 07/2015. Echo EF 35-40% with LVH (I looked at echo and feel EF more like 45-50% with AF with RVR). No cath performed. Was intubated initially in the hospital for airway protection. Discharged 02/09/2016.    He had an intermediate risk Lexiscan Myoview in December 2015, that showed moderate area of anterolateral/lateral wall ischemia. He went for a left heart cath a month later in January 2016 (report below), and he had 90% tubular mid-ramus stenosis and 70% bifurcation PDA/PLB stenosis.  We saw him for the first time in May 2017. Clonidine weaned off. Entresto added. Our pharmacy team subsequently switched lopressor to carvedilol. Feels better. Breathing better. BP much improved. Sleeping better. Eating a lot. Weight up 13 points. No edema, orthopnea, PND. Completed HHPT. Able to get around house and do his activities without difficulty. Walks up and down 13 steps. HAd some claudication in left calf with  PT  Echo 6/17: EF 35-40%  CARDIAC CATHETERIZATION REPORT 09/27/14 Coronary Angiographic Data:  Left Main: Long vessel, angiographically normal, trifurcates into LAD, LCX and Ramus branches  Left Anterior Descending (LAD): There is a proximal 20-30% eccentric, hazy plaque and mild mid-LAD stenosis, vessel reaches the apex  1st diagonal (D1): Smaller branch, mild diffuse disease  Circumflex (LCx): AV groove vessel, gives off 2 OM branches, mild ostial stenosis.  1st obtuse marginal: Smaller vessel, no stenosis.  2nd obtuse marginal: Minimal luminal irregularities  Ramus Intermedius: There is a tubular 90% mid-Ramus stenosis, the vessel coarses to the lateral wall  Right Coronary Artery: Dominant artery. Mild luminal irregularities.  right ventricle branch of right coronary artery: No stenosis.  posterior descending artery: There is a 70% ostial bifurcation stenosis, however, this is <2.0 mm in diameter  posterior lateral branch: Ostial stenosis (see above)    Past Medical History:  Diagnosis Date  . Atrial fibrillation, permanent (HCC) 02/01/2016  . Blood transfusion without reported diagnosis   . CAD in native artery cath 2016 90% mRamus  02/01/2016  . CHF (congestive heart failure) (HCC)   . Cholelithiases 05/01/2012  . Claudication (HCC)    bilateral  . Cocaine abuse   . Dysrhythmia    afib  . ETOH abuse   . Hepatitis C antibody test positive 04/2012  . Hyperpigmentation   . Hypertension   . PAD (peripheral artery disease) (HCC)   . Persistent atrial fibrillation (HCC) 08/23/2014  . Thrombocytopenia (HCC)   . Tobacco abuse  Current Outpatient Prescriptions  Medication Sig Dispense Refill  . apixaban (ELIQUIS) 5 MG TABS tablet Take 1 tablet (5 mg total) by mouth 2 (two) times daily. 120 tablet 0  . buPROPion (WELLBUTRIN SR) 150 MG 12 hr tablet TAKE 1 TABLET BY MOUTH TWICE A DAY 60 tablet 0  . carvedilol (COREG) 25 MG tablet Take 1 tablet (25 mg total) by  mouth 2 (two) times daily. 60 tablet 5  . cloNIDine (CATAPRES) 0.2 MG tablet TAKE 1 TABLET BY MOUTH TWICE A DAY 60 tablet 0  . diltiazem (CARDIZEM CD) 240 MG 24 hr capsule Take 1 capsule (240 mg total) by mouth daily. 90 capsule 0  . folic acid (FOLVITE) 1 MG tablet Take 1 tablet (1 mg total) by mouth daily. Need appointment before anymore refills 30 tablet 1  . furosemide (LASIX) 40 MG tablet Take 1 tablet (40 mg total) by mouth daily. 30 tablet 0  . levETIRAcetam (KEPPRA) 1000 MG tablet TAKE 1 TABLET BY MOUTH TWICE A DAY 60 tablet 0  . Multiple Vitamin (MULTIVITAMIN WITH MINERALS) TABS tablet Take 1 tablet by mouth daily. Men's One a Day    . pantoprazole (PROTONIX) 40 MG tablet TAKE 1 TABLET BY MOUTH AT BEDTIME 30 tablet 0  . potassium chloride SA (K-DUR,KLOR-CON) 20 MEQ tablet Take 1 tablet (20 mEq total) by mouth daily. 30 tablet 0  . rosuvastatin (CRESTOR) 20 MG tablet Take 20 mg by mouth at bedtime.    . sacubitril-valsartan (ENTRESTO) 49-51 MG Take 1 tablet by mouth 2 (two) times daily. 60 tablet 6   No current facility-administered medications for this encounter.     No Known Allergies    Social History   Social History  . Marital status: Married    Spouse name: Stefano Gaul  . Number of children: 5  . Years of education: 12   Occupational History  . Retired Arts administrator   Social History Main Topics  . Smoking status: Current Every Day Smoker    Packs/day: 0.25    Years: 40.00    Types: Cigarettes  . Smokeless tobacco: Never Used  . Alcohol use 0.0 - 9.6 oz/week     Comment: He used to drink up to 1/2 pint daily; 6 beers. Quit in March 2015  . Drug use: No     Comment: cocaine, marijuana  . Sexual activity: Yes    Partners: Female   Other Topics Concern  . Not on file   Social History Narrative   Lives with his wife.  Was raised by a non-related family from the age of 14 years, and so knows very little about his biological family history.      Family History  Problem  Relation Age of Onset  . Seizures Daughter   . Cancer Mother     Vitals:   04/15/16 0935  BP: 126/82  Pulse: 74  SpO2: 99%  Weight: 222 lb 8 oz (100.9 kg)    PHYSICAL EXAM: General:  Looks good. NAD HEENT: Normal Neck: supple. JVP flat Carotids 2+ bilat without bruits. No thyromegaly or nodule noted.  Cor: PMI nondisplaced. Irreg, irreg . No M/G/R Lungs: CTAB, normal effort Abdomen: soft, obese, NT, ND, no HSM. No bruits or masses. +BS  Extremities: no cyanosis, clubbing, rash, edema exstensive scar RLE. Non palpable distal pulses Neuro: alert & oriented x 3, cranial nerves grossly intact. moves all 4 extremities w/o difficulty. Affect pleasant.   ASSESSMENT & PLAN: 1. Chronic systolic HF    --EF  35-40% by echo 6/17 (images reviewed personally)   --NYHA II 2. Chronic AF 3. CAD with recent NSTEMI 4. Severe HTN 5. Polysubstance abuse  6. Tobacco use quit 5/17  7. CKD stage III 8. Lower extremity claudication   He is improved. Now NYHA II. Volume status looks good. BP much improved. Will stop diltiazem with decreased EF. Increase entresto 97/103. Stressed need to watch his diet and lose weight. Will refer to HF Program at [facility]. Check labs today. Get ABIs to assess claudication.  Continue Eliquis for chronic AF. No ASA with Eliquis. Continue bb and statin.    Arvilla Meres, MD 9:56 AM

## 2016-04-15 NOTE — Telephone Encounter (Signed)
Patient and wife state that Eliquis is only going through the insurance for 15 day supply. [facility] who verified that insurance will not allow greater than 30 tablets per Rx. Called [facility] who also verified that it will not go through for > 30 tablets at a time but that [facility] can call the pharmacy help desk to see if it can be overridden when next refill is due ([phone]). Will relay info to [facility].   Tyler Deis. Bonnye Fava, PharmD, BCPS, CPP Clinical Pharmacist Pager: [phone] Phone: [phone] 04/15/2016 11:11 AM

## 2016-04-24 ENCOUNTER — Inpatient Hospital Stay (HOSPITAL_COMMUNITY): Admission: RE | Admit: 2016-04-24 | Payer: 59 | Source: Ambulatory Visit

## 2016-04-24 ENCOUNTER — Other Ambulatory Visit: Payer: Self-pay | Admitting: Internal Medicine

## 2016-04-24 DIAGNOSIS — L97909 Non-pressure chronic ulcer of unspecified part of unspecified lower leg with unspecified severity: Secondary | ICD-10-CM

## 2016-04-24 DIAGNOSIS — I739 Peripheral vascular disease, unspecified: Secondary | ICD-10-CM

## 2016-05-02 NOTE — Telephone Encounter (Signed)
e

## 2016-05-08 ENCOUNTER — Other Ambulatory Visit (HOSPITAL_COMMUNITY): Payer: Self-pay | Admitting: Internal Medicine

## 2016-05-09 ENCOUNTER — Encounter (INDEPENDENT_AMBULATORY_CARE_PROVIDER_SITE_OTHER): Payer: Self-pay

## 2016-05-09 ENCOUNTER — Ambulatory Visit (HOSPITAL_COMMUNITY)
Admission: RE | Admit: 2016-05-09 | Discharge: 2016-05-09 | Disposition: A | Discharge status: Home / Self Care | Payer: Managed Care, Other (non HMO) | Source: Ambulatory Visit | Attending: Internal Medicine | Admitting: Internal Medicine

## 2016-05-09 DIAGNOSIS — Z720 Tobacco use: Secondary | ICD-10-CM | POA: Diagnosis not present

## 2016-05-09 DIAGNOSIS — I739 Peripheral vascular disease, unspecified: Secondary | ICD-10-CM

## 2016-05-09 DIAGNOSIS — L97909 Non-pressure chronic ulcer of unspecified part of unspecified lower leg with unspecified severity: Secondary | ICD-10-CM | POA: Diagnosis not present

## 2016-05-09 DIAGNOSIS — I509 Heart failure, unspecified: Secondary | ICD-10-CM | POA: Insufficient documentation

## 2016-05-09 DIAGNOSIS — I110 Hypertensive heart disease with heart failure: Secondary | ICD-10-CM | POA: Insufficient documentation

## 2016-05-09 DIAGNOSIS — I771 Stricture of artery: Secondary | ICD-10-CM | POA: Diagnosis not present

## 2016-05-09 DIAGNOSIS — I2510 Atherosclerotic heart disease of native coronary artery without angina pectoris: Secondary | ICD-10-CM | POA: Diagnosis not present

## 2016-05-09 DIAGNOSIS — R938 Abnormal findings on diagnostic imaging of other specified body structures: Secondary | ICD-10-CM | POA: Insufficient documentation

## 2016-05-20 ENCOUNTER — Other Ambulatory Visit (HOSPITAL_COMMUNITY): Payer: Self-pay | Admitting: Internal Medicine

## 2016-05-20 ENCOUNTER — Telehealth: Payer: Self-pay | Admitting: Internal Medicine

## 2016-05-20 NOTE — Telephone Encounter (Signed)
New message  ° ° °Pt verbalized that he is returning call for rn °

## 2016-05-20 NOTE — Telephone Encounter (Signed)
Returned call to patient no answer.Unable to leave a voice mail due to mail box full.

## 2016-05-21 NOTE — Telephone Encounter (Signed)
Spoke to patient. Not sure who left message to return call.  patient had recent vascular test on lower legs.  per Dr Georga Bora appointment with Dr Kirke Corin  Patient aware will contact him with an appointment at [facility] office

## 2016-05-24 ENCOUNTER — Telehealth: Payer: Self-pay | Admitting: Cardiovascular Disease

## 2016-05-24 NOTE — Telephone Encounter (Signed)
Called and talked with the patient and wife and gave them the date, time and location of the appointment.

## 2016-05-24 NOTE — Telephone Encounter (Signed)
Pt scheduled to see Dr Kirke Corin 05/28/16.

## 2016-05-28 ENCOUNTER — Ambulatory Visit: Payer: 59 | Admitting: Cardiovascular Disease

## 2016-06-04 ENCOUNTER — Encounter: Payer: Self-pay | Admitting: Cardiology

## 2016-06-16 NOTE — Progress Notes (Signed)
No show for appt. 

## 2016-06-17 ENCOUNTER — Inpatient Hospital Stay (HOSPITAL_COMMUNITY)
Admission: RE | Admit: 2016-06-17 | Discharge: 2016-06-17 | Disposition: A | Discharge status: Home / Self Care | Payer: 59 | Source: Ambulatory Visit | Attending: Internal Medicine | Admitting: Internal Medicine

## 2016-06-17 DIAGNOSIS — I5022 Chronic systolic (congestive) heart failure: Secondary | ICD-10-CM

## 2016-06-18 ENCOUNTER — Ambulatory Visit: Payer: 59 | Admitting: Cardiology

## 2016-06-18 ENCOUNTER — Encounter: Payer: Self-pay | Admitting: Cardiovascular Disease

## 2016-06-18 ENCOUNTER — Ambulatory Visit (INDEPENDENT_AMBULATORY_CARE_PROVIDER_SITE_OTHER): Payer: Managed Care, Other (non HMO) | Admitting: Cardiovascular Disease

## 2016-06-18 VITALS — BP 176/120 | HR 72 | Ht 70.0 in | Wt 229.6 lb

## 2016-06-18 DIAGNOSIS — I739 Peripheral vascular disease, unspecified: Secondary | ICD-10-CM | POA: Diagnosis not present

## 2016-06-18 DIAGNOSIS — I150 Renovascular hypertension: Secondary | ICD-10-CM | POA: Diagnosis not present

## 2016-06-18 DIAGNOSIS — I2510 Atherosclerotic heart disease of native coronary artery without angina pectoris: Secondary | ICD-10-CM | POA: Diagnosis not present

## 2016-06-18 DIAGNOSIS — I10 Essential (primary) hypertension: Secondary | ICD-10-CM | POA: Diagnosis not present

## 2016-06-18 DIAGNOSIS — I5022 Chronic systolic (congestive) heart failure: Secondary | ICD-10-CM

## 2016-06-18 DIAGNOSIS — E785 Hyperlipidemia, unspecified: Secondary | ICD-10-CM

## 2016-06-18 NOTE — Progress Notes (Signed)
Cardiology Office Note   Date:  06/18/2016   ID:  Kevin Polesdward T [place]st, DOB 02/01/1951, MRN [id number]  PCP:  Jackie PlumSEI-BONSU,GEORGE, MD  Cardiologist: Dr. Gala RomneyBensimhon  Chief Complaint  Patient presents with  . New Patient (Initial Visit)    PVD      History of Present Illness: Kevin [name] is a 65 y.o. male who was referred by Dr. Gala RomneyBensimhon For evaluation and management of peripheral arterial disease. The patient has extensive medical problems that include peripheral arterial disease, coronary artery disease, hypertension, polysubstance abuse including cocaine and alcohol, persistent atrial fibrillation, TIA, hepatitis C, previous stroke and chronic systolic heart failure. Previous cardiac catheterization showed branch disease. He quit smoking this year after his stroke and currently does not drink any alcohol and does not use any recreational drugs. He reports known history of peripheral arterial disease for many years . He had angiography done in 2009 by Dr. Alanda AmassWeintraub which showed occluded right SFA with two-vessel runoff below the knee. There was high-grade focal stenosis of the left SFA. The patient underwent left SFA angioplasty without stent placement at that time. He has been complaining of left calf claudication for many years now which is happening after walking about 50 feet. He has no rest pain and denies any symptoms on the right side. He underwent noninvasive vascular evaluation which showed an ABI of 0.48 on the left and normal on the right. Duplex showed occlusion of bilateral SFAs.  Past Medical History:  Diagnosis Date  . Atrial fibrillation, permanent (HCC) 02/01/2016  . Blood transfusion without reported diagnosis   . CAD in native artery cath 2016 90% mRamus  02/01/2016  . CHF (congestive heart failure) (HCC)   . Cholelithiases 05/01/2012  . Claudication (HCC)    bilateral  . Cocaine abuse   . Dysrhythmia    afib  . ETOH abuse   . Hepatitis C antibody test positive  04/2012  . Hyperpigmentation   . Hypertension   . PAD (peripheral artery disease) (HCC)   . Persistent atrial fibrillation (HCC) 08/23/2014  . Thrombocytopenia (HCC)   . Tobacco abuse     Past Surgical History:  Procedure Laterality Date  . Cardiolite     negative for ischemia  . CARDIOVASCULAR STRESS TEST    . CARDIOVERSION  05/05/2012   Procedure: CARDIOVERSION;  Surgeon: Thurmon FairMihai Croitoru, MD;  Location: [facility] ENDOSCOPY;  Service: Cardiovascular;  Laterality: N/A;  . CAROTID ARTERY ANGIOPLASTY    . DOPPLER ECHOCARDIOGRAPHY    . ENDOVASCULAR STENT INSERTION  right leg  . LEFT HEART CATHETERIZATION WITH CORONARY ANGIOGRAM N/A 05/01/2012   Procedure: LEFT HEART CATHETERIZATION WITH CORONARY ANGIOGRAM;  Surgeon: Marykay Lexavid W Harding, MD;  [facility];  Service: Cardiovascular;  Laterality: N/A;  . LEFT HEART CATHETERIZATION WITH CORONARY ANGIOGRAM N/A 09/27/2014   Procedure: LEFT HEART CATHETERIZATION WITH CORONARY ANGIOGRAM;  Surgeon: Chrystie NoseKenneth C. Hilty, MD;  [facility];  Service: Cardiovascular;  Laterality: N/A;  . TEE WITHOUT CARDIOVERSION  05/05/2012   Procedure: TRANSESOPHAGEAL ECHOCARDIOGRAM (TEE);  Surgeon: Thurmon FairMihai Croitoru, MD;  [facility];  Service: Cardiovascular;  Laterality: N/A;     Current Outpatient Prescriptions  Medication Sig Dispense Refill  . apixaban (ELIQUIS) 5 MG TABS tablet Take 1 tablet (5 mg total) by mouth 2 (two) times daily. 120 tablet 0  . buPROPion (WELLBUTRIN SR) 150 MG 12 hr tablet TAKE 1 TABLET BY MOUTH TWICE A DAY 60 tablet 0  . carvedilol (COREG) 25 MG tablet Take  1 tablet (25 mg total) by mouth 2 (two) times daily. 60 tablet 5  . folic acid (FOLVITE) 1 MG tablet Take 1 tablet (1 mg total) by mouth daily. Need appointment before anymore refills 30 tablet 1  . furosemide (LASIX) 40 MG tablet Take 1 tablet (40 mg total) by mouth daily. 30 tablet 0  . levETIRAcetam (KEPPRA) 1000 MG tablet TAKE 1 TABLET BY MOUTH TWICE A DAY 60 tablet 2  .  Multiple Vitamin (MULTIVITAMIN WITH MINERALS) TABS tablet Take 1 tablet by mouth daily. Men's One a Day    . pantoprazole (PROTONIX) 40 MG tablet TAKE 1 TABLET BY MOUTH AT BEDTIME 30 tablet 3  . potassium chloride SA (K-DUR,KLOR-CON) 20 MEQ tablet Take 1 tablet (20 mEq total) by mouth daily. 30 tablet 0  . rosuvastatin (CRESTOR) 20 MG tablet Take 20 mg by mouth at bedtime.    . sacubitril-valsartan (ENTRESTO) 97-103 MG Take 1 tablet by mouth 2 (two) times daily. 60 tablet 3   No current facility-administered medications for this visit.     Allergies:   Review of patient's allergies indicates no known allergies.    Social History:  The patient  reports that he has been smoking Cigarettes.  He has a 10.00 pack-year smoking history. He has never used smokeless tobacco. He reports that he drinks alcohol. He reports that he does not use drugs.   Family History:  The patient's family history includes Cancer in his mother; Seizures in his daughter.    ROS:  Please see the history of present illness.   Otherwise, review of systems are positive for none.   All other systems are reviewed and negative.    PHYSICAL EXAM: VS:  BP (!) 176/120   Pulse 72   Ht 5\' 10"  (1.778 m)   Wt 229 lb 9.6 oz (104.1 kg)   BMI 32.94 kg/m  , BMI Body mass index is 32.94 kg/m. GEN: Well nourished, well developed, in no acute distress  HEENT: normal  Neck: no JVD, carotid bruits, or masses Cardiac: RRR; no murmurs, rubs, or gallops,no edema  Respiratory:  clear to auscultation bilaterally, normal work of breathing GI: soft, nontender, nondistended, + BS MS: no deformity or atrophy  Skin: warm and dry, no rash Neuro:  Strength and sensation are intact Psych: euthymic mood, full affect Vascular: Femoral pulses are normal. Distal pulses are not palpable.  EKG:  EKG is not ordered today.    Recent Labs: 01/31/2016: ALT 30 02/09/2016: Magnesium 2.0 04/15/2016: B Natriuretic Peptide 808.4; BUN 19; Creatinine,  Ser 1.31; Hemoglobin 14.4; Platelets 79; Potassium 4.3; Sodium 135    Lipid Panel    Component Value Date/Time   CHOL 105 02/08/2016 0435   TRIG 59 02/08/2016 0435   HDL 31 (L) 02/08/2016 0435   CHOLHDL 3.4 02/08/2016 0435   VLDL 12 02/08/2016 0435   LDLCALC 62 02/08/2016 0435      Wt Readings from Last 3 Encounters:  06/18/16 229 lb 9.6 oz (104.1 kg)  04/15/16 222 lb 8 oz (100.9 kg)  03/06/16 208 lb (94.3 kg)       PAD Screen 06/18/2016  Previous PAD dx? Yes  Previous surgical procedure? Yes  Dates of procedures leg  Pain with walking? Yes  Subsides with rest? Yes  Feet/toe relief with dangling? No  Painful, non-healing ulcers? No  Extremities discolored? No      ASSESSMENT AND PLAN:  1.   Peripheral arterial disease with severe left calf claudication with bilateral  SFA occlusion. The patient is not diabetic and he quit smoking earlier this year. Thus, the chance of progression is low. I discussed with him the natural history and management of claudication. I recommend attempting a walking program to improve  claudication free distance. This was discussed with him in details and he is going to start a daily walking program. If no improvement in symptoms after few months, I recommend proceeding with angiography and possible endovascular intervention.  2. Refractory hypertension: The patient's blood pressure is severely elevated today but he did not take his medications. He is at risk for renal artery stenosis and thus I requested a renal artery duplex.  3. Chronic systolic heart failure: Currently on carvedilol and Entresto . He is to be euvolemic. If blood pressure remains elevated, into the adding a combination of hydralazine and long-acting nitroglycerin.  4. Coronary artery disease involving native coronary arteries without angina: Continue medical therapy.  5. Hyperlipidemia: Continue treatment with rosuvastatin with a target LDL of less than 70.    Disposition:    FU with me in 2 months  Signed,  Lorine Bears, MD  06/18/2016 9:18 AM    [facility]

## 2016-06-18 NOTE — Patient Instructions (Signed)
Medication Instructions:  Your physician recommends that you continue on your current medications as directed. Please refer to the Current Medication list given to you today.  Labwork: No new orders.   Testing/Procedures: Your physician has requested that you have a renal artery duplex. During this test, an ultrasound is used to evaluate blood flow to the kidneys. Allow one hour for this exam. Do not eat after midnight the day before and avoid carbonated beverages. Take your medications as you usually do.  Follow-Up: Your physician recommends that you schedule a follow-up appointment in: 2 MONTHS with Dr Kirke Corin  Any Other Special Instructions Will Be Listed Below (If Applicable).  Please walk 30 minutes everyday!!!   If you need a refill on your cardiac medications before your next appointment, please call your pharmacy.

## 2016-06-25 ENCOUNTER — Ambulatory Visit (HOSPITAL_COMMUNITY)
Admission: RE | Admit: 2016-06-25 | Discharge: 2016-06-25 | Disposition: A | Discharge status: Home / Self Care | Payer: 59 | Source: Ambulatory Visit | Attending: Cardiovascular Disease | Admitting: Cardiovascular Disease

## 2016-06-25 DIAGNOSIS — I2510 Atherosclerotic heart disease of native coronary artery without angina pectoris: Secondary | ICD-10-CM | POA: Diagnosis not present

## 2016-06-25 DIAGNOSIS — I150 Renovascular hypertension: Secondary | ICD-10-CM | POA: Diagnosis not present

## 2016-06-25 DIAGNOSIS — Z720 Tobacco use: Secondary | ICD-10-CM | POA: Diagnosis not present

## 2016-06-25 DIAGNOSIS — I10 Essential (primary) hypertension: Secondary | ICD-10-CM | POA: Insufficient documentation

## 2016-06-25 DIAGNOSIS — I739 Peripheral vascular disease, unspecified: Secondary | ICD-10-CM | POA: Insufficient documentation

## 2016-06-26 NOTE — Progress Notes (Signed)
[facility] [employer] PREP Weekly Session   Patient Details  Name: Kevin [name] MRN: [id number] Date of Birth: 04-05-1951 Age: 65 y.o. PCP: Jackie Plum, MD  Vitals:   06/26/16 1243  Weight: 227 lb 6.4 oz (103.1 kg)        [employer] Weekly seesion - 06/26/16 1200      Weekly Session   Topic Discussed Hitting roadblocks   Minutes exercised this week 0 minutes   Classes attended to date 5   Comments has been stressed with life        Rose Fillers 06/26/2016, 12:44 PM

## 2016-07-18 ENCOUNTER — Other Ambulatory Visit (HOSPITAL_COMMUNITY): Payer: Self-pay | Admitting: Internal Medicine

## 2016-08-09 ENCOUNTER — Telehealth: Payer: Self-pay

## 2016-08-09 NOTE — Telephone Encounter (Signed)
Kevin [name] states he will try and make it back to the PREP class after Thanksgiving and "isn't giving up on [place] or himself"

## 2016-08-13 ENCOUNTER — Other Ambulatory Visit (HOSPITAL_COMMUNITY): Payer: Self-pay | Admitting: Internal Medicine

## 2016-08-14 ENCOUNTER — Encounter: Payer: Self-pay | Admitting: *Deleted

## 2016-08-20 ENCOUNTER — Ambulatory Visit: Payer: Managed Care, Other (non HMO) | Admitting: Cardiovascular Disease

## 2016-08-22 ENCOUNTER — Other Ambulatory Visit (HOSPITAL_COMMUNITY): Payer: Self-pay | Admitting: *Deleted

## 2016-08-22 MED ORDER — PANTOPRAZOLE SODIUM 40 MG PO TBEC: 40.0000 mg | DELAYED_RELEASE_TABLET | Freq: Every day | ORAL | 3 refills | Status: DC

## 2016-08-22 MED ORDER — APIXABAN 5 MG PO TABS: 5.0000 mg | ORAL_TABLET | Freq: Two times a day (BID) | ORAL | 3 refills | Status: DC

## 2016-08-22 MED ORDER — CARVEDILOL 25 MG PO TABS: 25.0000 mg | ORAL_TABLET | Freq: Two times a day (BID) | ORAL | 3 refills | Status: DC

## 2016-08-22 MED ORDER — LEVETIRACETAM 1000 MG PO TABS: 1000.0000 mg | ORAL_TABLET | Freq: Two times a day (BID) | ORAL | 2 refills | Status: DC

## 2016-08-22 MED ORDER — SACUBITRIL-VALSARTAN 97-103 MG PO TABS: 1.0000 | ORAL_TABLET | Freq: Two times a day (BID) | ORAL | 3 refills | Status: DC

## 2017-01-07 ENCOUNTER — Other Ambulatory Visit (HOSPITAL_COMMUNITY): Payer: Self-pay | Admitting: Internal Medicine

## 2017-01-20 ENCOUNTER — Other Ambulatory Visit (HOSPITAL_COMMUNITY): Payer: Self-pay | Admitting: Internal Medicine

## 2017-02-25 ENCOUNTER — Telehealth (HOSPITAL_COMMUNITY): Payer: Self-pay | Admitting: Pharmacist

## 2017-02-25 NOTE — Telephone Encounter (Signed)
Eliquis 5 mg BID PA approved by [facility] through 02/25/18.   Tyler Deis. Bonnye Fava, PharmD, BCPS, CPP Clinical Pharmacist Pager: [phone] Phone: [phone] 02/25/2017 11:55 AM

## 2017-03-05 ENCOUNTER — Telehealth (HOSPITAL_COMMUNITY): Payer: Self-pay | Admitting: Pharmacist

## 2017-03-05 NOTE — Telephone Encounter (Signed)
Entresto Prior Authorization approved by [employer] on 03/05/17, active from 02/03/17 - 03/05/18  Allena Katz, Pharm.D. PGY1 Pharmacy Resident 6/13/20184:42 PM Pager [phone]

## 2017-03-15 IMAGING — CT CT HEAD W/O CM
3 of 4 series · 17 of 47 positions shown, 20 images · non-contrast
Comparison: 01/31/2016 and 08/23/2015 as well as MRI 01/31/2016

CLINICAL DATA: Small subacute infarct right occipital region. Blood
pressure instability.

EXAM:
CT HEAD WITHOUT CONTRAST
TECHNIQUE: Contiguous axial images were obtained from the base of the skull
through the vertex without intravenous contrast.

[Series 201: head w/o, idose (1) · axial · non-contrast · 0.45mm/px · z∈[+107,+247]mm · 11 of 34 slices shown, 14 images]
[im 3/34  brain]
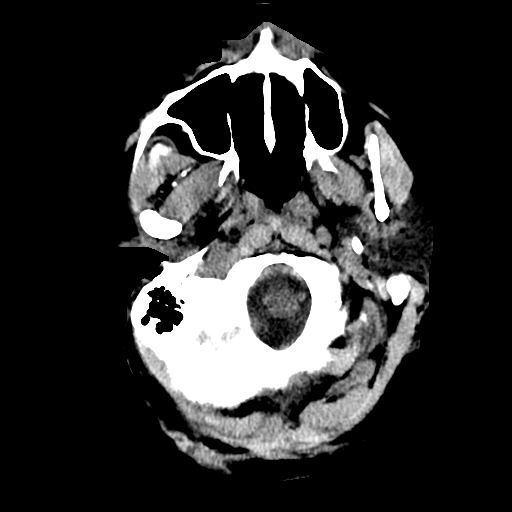
[im 3/34  bone]
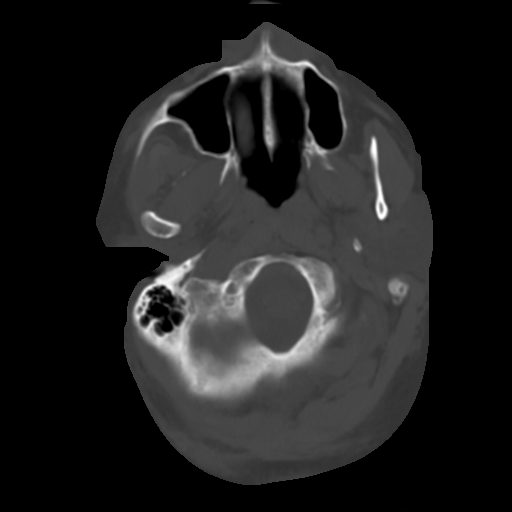
[im 5/34  brain]
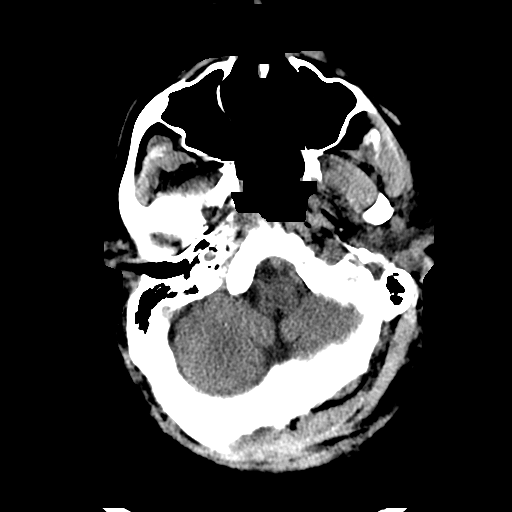
[im 8/34  brain]
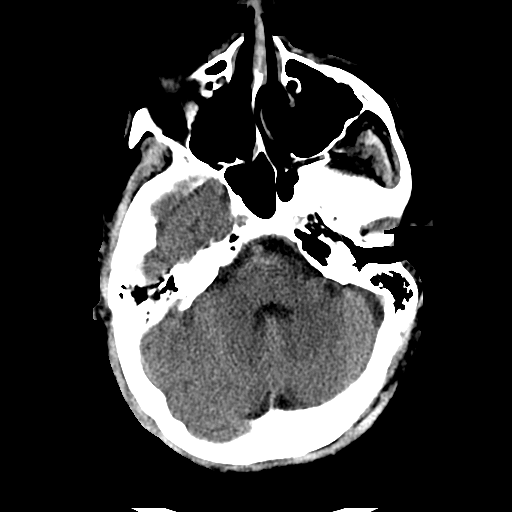
[im 12/34  brain]
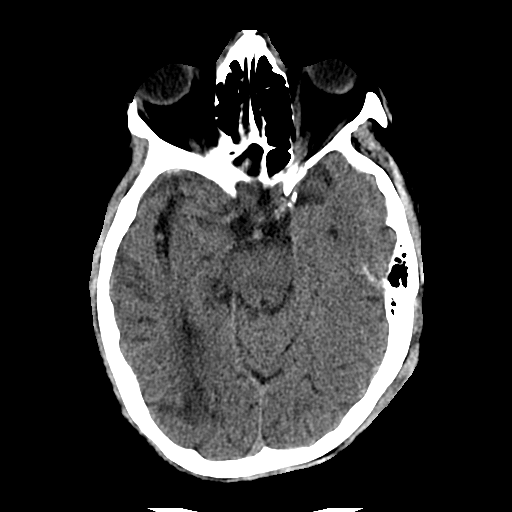
[im 15/34  brain]
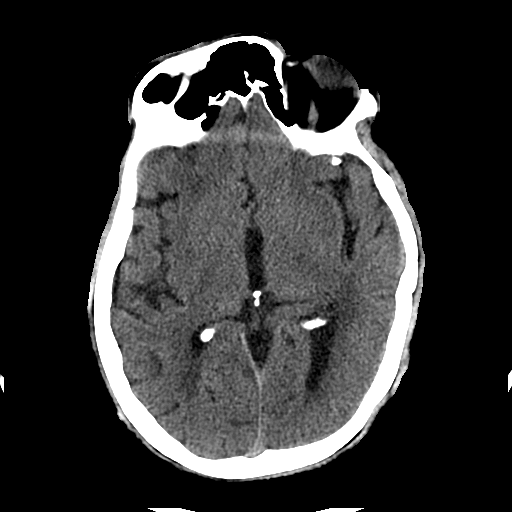
[im 15/34  bone]
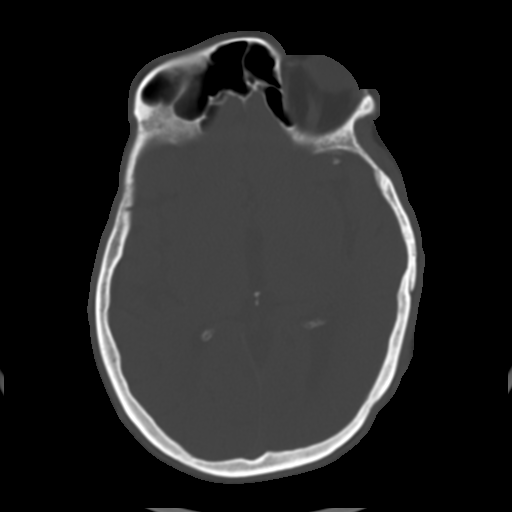
[im 17/34  brain]
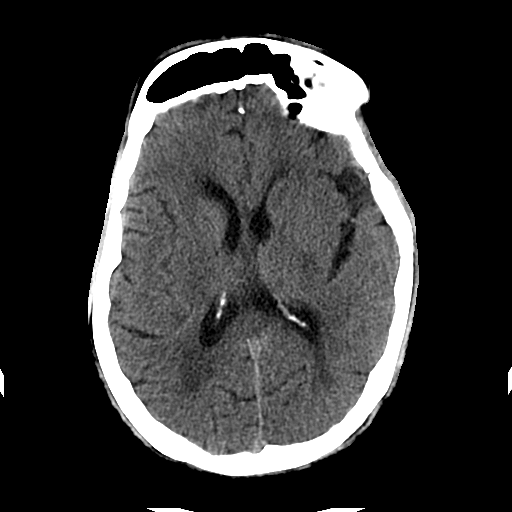
[im 19/34  brain]
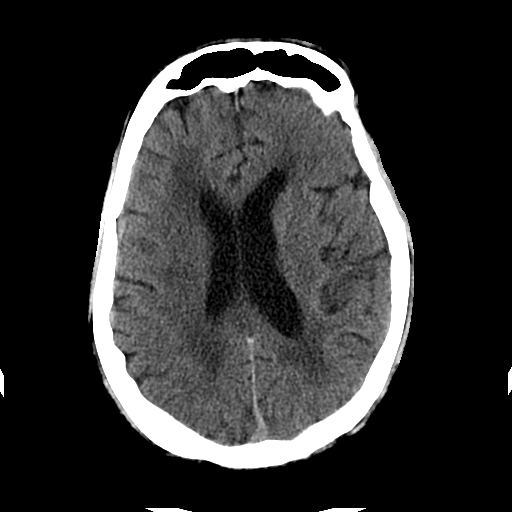
[im 22/34  brain]
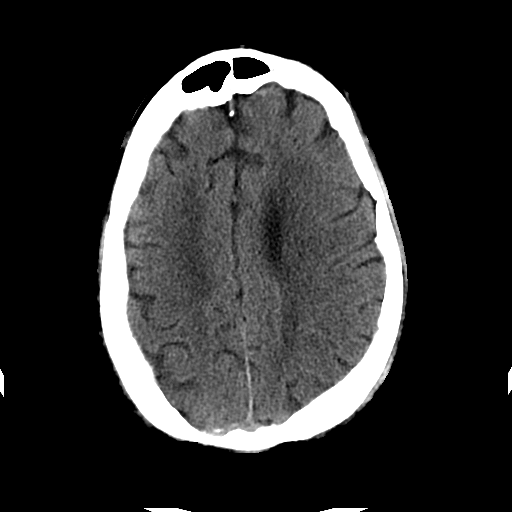
[im 26/34  brain]
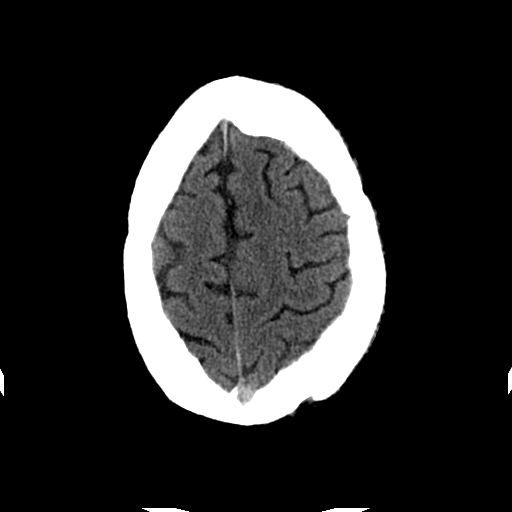
[im 26/34  bone]
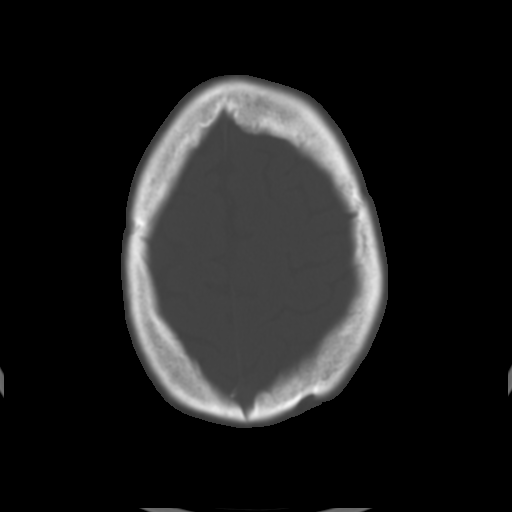
[im 29/34  brain]
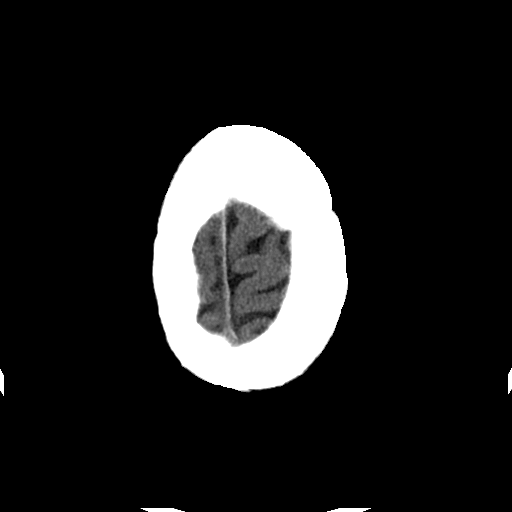
[im 31/34  brain]
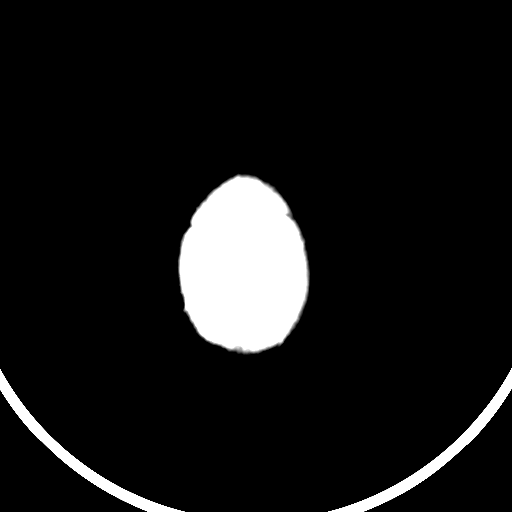

[Series 203: coronal st, idose (1) · coronal · 0.40mm/px · 3 of 73 slices shown]
[im 25/73  brain]
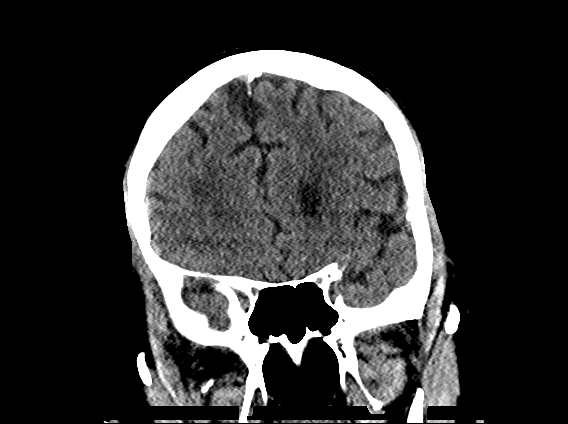
[im 33/73  brain]
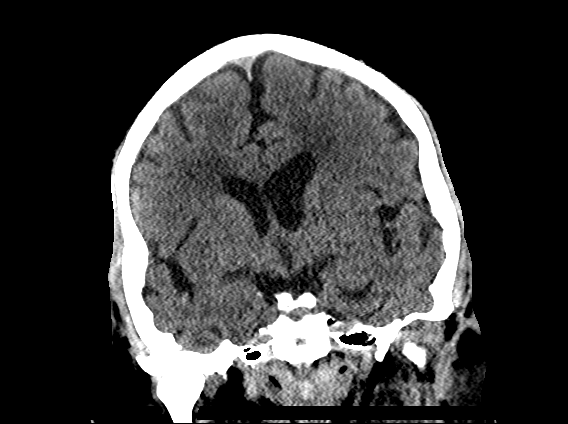
[im 41/73  brain]
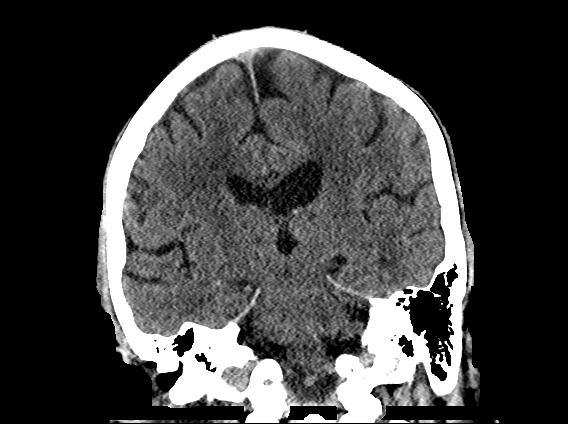

[Series 204: sagittal st, idose (1) · sagittal · 0.40mm/px · 3 of 73 slices shown]
[im 25/73  brain]
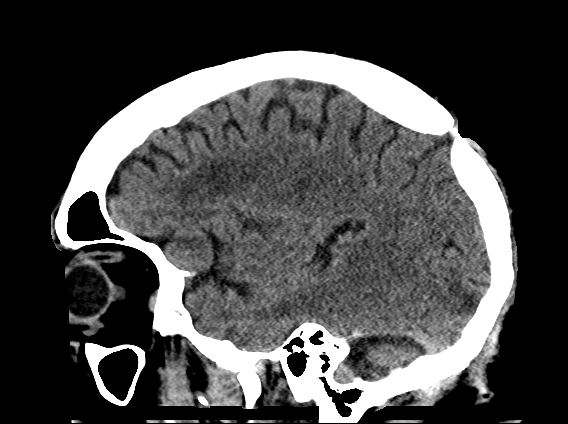
[im 37/73  brain]
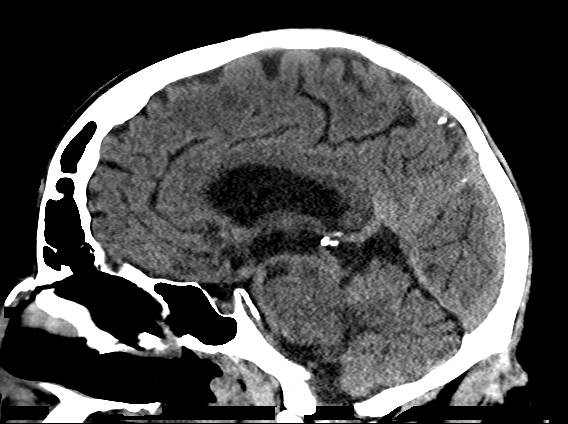
[im 49/73  brain]
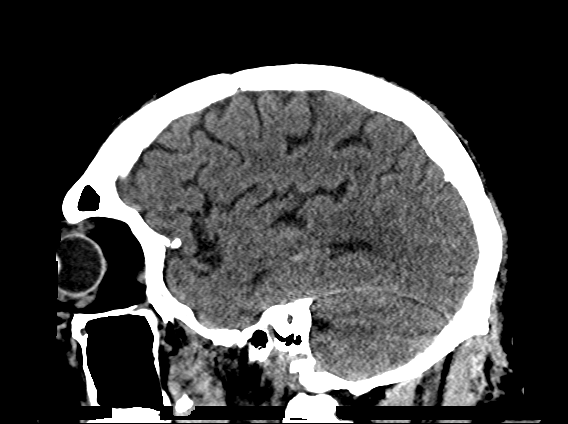

[17 of 47 positions shown; findings below may reference images not displayed]

FINDINGS: Postsurgical changes of the occipital skull. Ventricles, cisterns
and other CSF spaces are within normal. Evidence of small old right
occipital/posterior temporal infarct. Mild chronic ischemic
microvascular disease is present. No evidence of mass, mass effect,
shift of midline structures or acute hemorrhage. No definite acute
right MCA territory infarct as suggested on recent MRI. Remainder
the exam is unchanged.
IMPRESSION: No acute intracranial findings.

Chronic ischemic microvascular disease. Small old right occipital/
posterior temporal infarct.

## 2017-03-15 IMAGING — CR DG CHEST 1V PORT
1 series · 1 of 1 positions shown · non-contrast
Comparison: 02/04/2016 and 02/03/2016

CLINICAL DATA: Tube placement.

EXAM:
PORTABLE CHEST 1 VIEW

[AP]
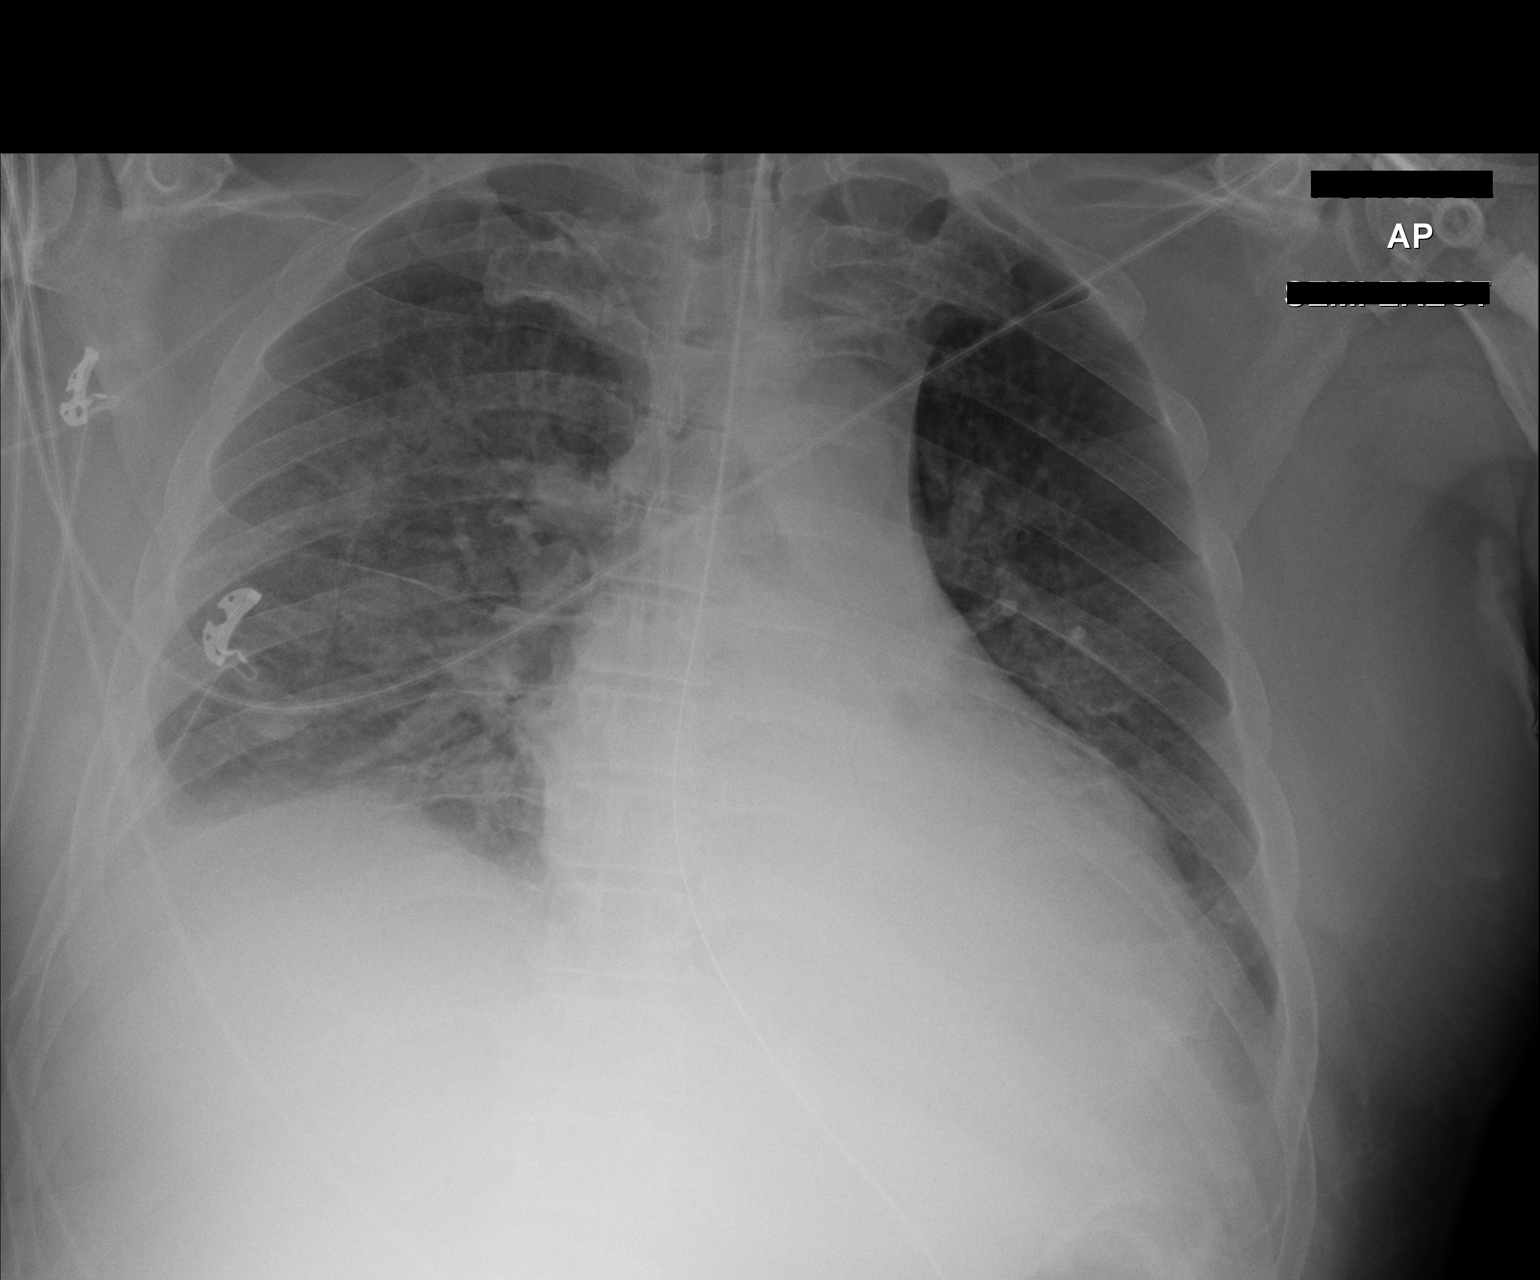

[1 of 1 positions shown; findings below may reference images not displayed]

FINDINGS: Patient rotated to the left. Endotracheal tube has tip 5 cm above
the carina. Enteric tube courses into the region of the stomach and
off the inferior portion of the film as tip is not visualized.
Interval placement of right-sided PICC line with tip at the
cavoatrial junction.

Lungs are adequately inflated with persistent hazy opacification
over the central right lung which may be due to asymmetric edema
versus infection. Small amount right pleural fluid. Hazy left
retrocardiac opacification unchanged likely effusion with
atelectasis versus infection. Stable cardiomegaly. Remainder the
exam is unchanged.
IMPRESSION: Persistent hazy right central lung opacification suggesting
asymmetric edema versus infection. Stable small right effusion.
Stable left retrocardiac opacification likely small effusion with
atelectasis although cannot exclude infection.

Stable cardiomegaly.

Tubes and lines as described.

## 2017-03-16 IMAGING — CR DG CHEST 1V PORT
1 series · 1 of 1 positions shown · non-contrast
Comparison: Portable chest x-ray February 04, 2016

CLINICAL DATA: Respiratory failure, NSTEMI, CHF.

EXAM:
PORTABLE CHEST 1 VIEW

[AP]
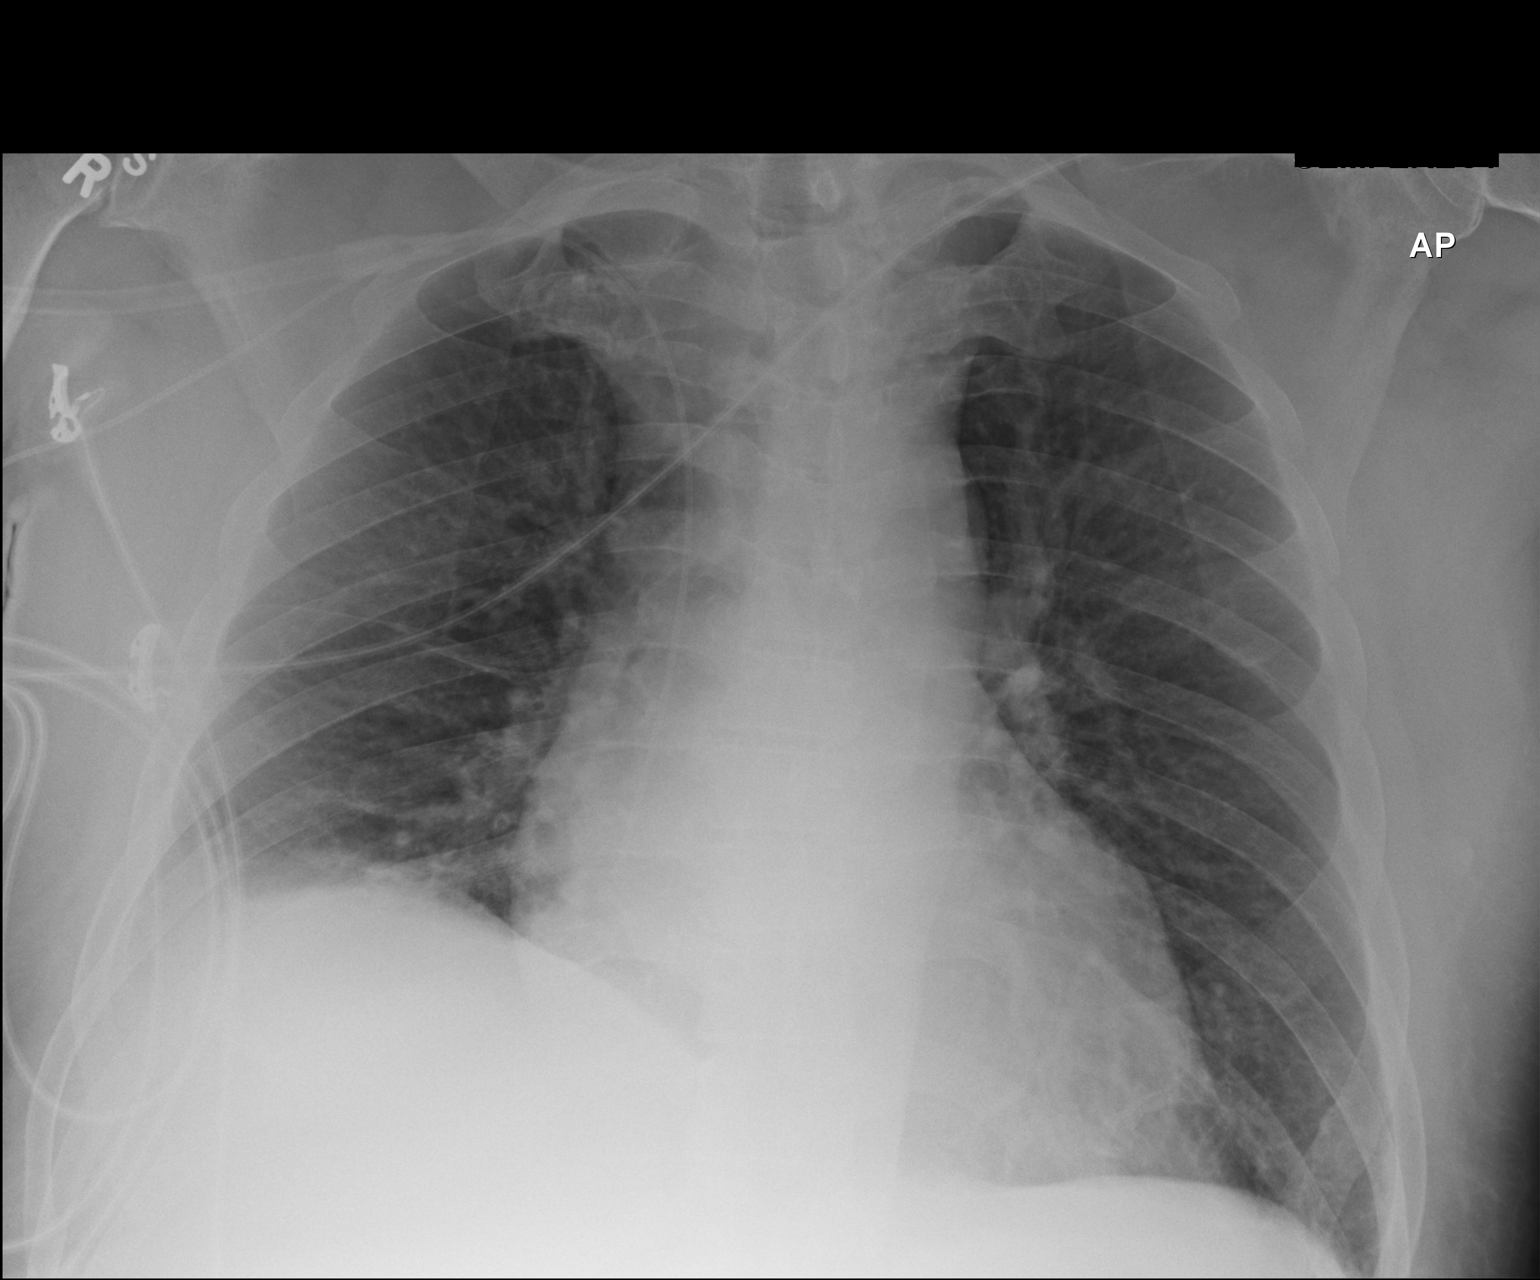

[1 of 1 positions shown; findings below may reference images not displayed]

FINDINGS: There is been interval extubation of the trachea and of the
esophagus. The left lung is well-expanded. The right hemidiaphragm
remains elevated. The pulmonary interstitial markings have improved
especially on the right. The cardiac silhouette remains enlarged.
The central pulmonary vascularity is mildly engorged. The
right-sided PICC line tip projects over the midportion of the SVC.
The bony thorax is unremarkable.
IMPRESSION: Improving CHF. Persistent elevation of the right hemidiaphragm.
Probable small right pleural effusion and minimal subsegmental
atelectasis at the right lung base.

## 2017-05-15 ENCOUNTER — Other Ambulatory Visit (HOSPITAL_COMMUNITY): Payer: Self-pay | Admitting: Internal Medicine

## 2017-05-16 ENCOUNTER — Other Ambulatory Visit (HOSPITAL_COMMUNITY): Payer: Self-pay | Admitting: Cardiology

## 2017-05-16 MED ORDER — CARVEDILOL 25 MG PO TABS: 25.0000 mg | ORAL_TABLET | Freq: Two times a day (BID) | ORAL | 1 refills | Status: DC

## 2017-06-02 ENCOUNTER — Other Ambulatory Visit (HOSPITAL_COMMUNITY): Payer: Self-pay | Admitting: Internal Medicine

## 2017-06-12 ENCOUNTER — Telehealth (HOSPITAL_COMMUNITY): Payer: Self-pay | Admitting: Vascular Surgery

## 2017-06-12 NOTE — Telephone Encounter (Signed)
Pt wife called to make pt appt w/ DB, DB next ava is 11/6, pt is out of Entresto she would like a refill until he can see DB [facility] .. Please advise

## 2017-06-13 ENCOUNTER — Other Ambulatory Visit (HOSPITAL_COMMUNITY): Payer: Self-pay | Admitting: Internal Medicine

## 2017-06-13 NOTE — Telephone Encounter (Signed)
rx sent in 

## 2017-06-14 ENCOUNTER — Other Ambulatory Visit (HOSPITAL_COMMUNITY): Payer: Self-pay | Admitting: Internal Medicine

## 2017-07-29 ENCOUNTER — Ambulatory Visit (HOSPITAL_COMMUNITY)
Admission: RE | Admit: 2017-07-29 | Discharge: 2017-07-29 | Disposition: A | Discharge status: Home / Self Care | Payer: Managed Care, Other (non HMO) | Source: Ambulatory Visit | Attending: Internal Medicine | Admitting: Internal Medicine

## 2017-07-29 ENCOUNTER — Telehealth (HOSPITAL_COMMUNITY): Payer: Self-pay | Admitting: Internal Medicine

## 2017-07-29 ENCOUNTER — Encounter (HOSPITAL_COMMUNITY): Payer: Self-pay | Admitting: Internal Medicine

## 2017-07-29 VITALS — BP 122/86 | HR 64 | Wt 231.0 lb

## 2017-07-29 DIAGNOSIS — I482 Chronic atrial fibrillation, unspecified: Secondary | ICD-10-CM

## 2017-07-29 DIAGNOSIS — I5022 Chronic systolic (congestive) heart failure: Secondary | ICD-10-CM | POA: Diagnosis not present

## 2017-07-29 DIAGNOSIS — I739 Peripheral vascular disease, unspecified: Secondary | ICD-10-CM

## 2017-07-29 DIAGNOSIS — Z7901 Long term (current) use of anticoagulants: Secondary | ICD-10-CM | POA: Insufficient documentation

## 2017-07-29 DIAGNOSIS — I130 Hypertensive heart and chronic kidney disease with heart failure and stage 1 through stage 4 chronic kidney disease, or unspecified chronic kidney disease: Secondary | ICD-10-CM | POA: Diagnosis not present

## 2017-07-29 DIAGNOSIS — Z79899 Other long term (current) drug therapy: Secondary | ICD-10-CM | POA: Insufficient documentation

## 2017-07-29 DIAGNOSIS — I481 Persistent atrial fibrillation: Secondary | ICD-10-CM | POA: Diagnosis not present

## 2017-07-29 DIAGNOSIS — I2510 Atherosclerotic heart disease of native coronary artery without angina pectoris: Secondary | ICD-10-CM | POA: Insufficient documentation

## 2017-07-29 DIAGNOSIS — N183 Chronic kidney disease, stage 3 (moderate): Secondary | ICD-10-CM | POA: Insufficient documentation

## 2017-07-29 DIAGNOSIS — Z8673 Personal history of transient ischemic attack (TIA), and cerebral infarction without residual deficits: Secondary | ICD-10-CM | POA: Diagnosis not present

## 2017-07-29 DIAGNOSIS — F1410 Cocaine abuse, uncomplicated: Secondary | ICD-10-CM | POA: Insufficient documentation

## 2017-07-29 DIAGNOSIS — F17210 Nicotine dependence, cigarettes, uncomplicated: Secondary | ICD-10-CM | POA: Diagnosis not present

## 2017-07-29 DIAGNOSIS — B1920 Unspecified viral hepatitis C without hepatic coma: Secondary | ICD-10-CM | POA: Insufficient documentation

## 2017-07-29 DIAGNOSIS — R531 Weakness: Secondary | ICD-10-CM

## 2017-07-29 NOTE — Patient Instructions (Addendum)
Will refer you to the PAD Clinic at [facility] with Dr. Kirke Corin. They will contact you by phone to schedule your initial appointment. Address: [address], [place], [place] [address]  Phone: [phone]  Will schedule you for an echocardiogram at [facility]. Address: [address] ([address] Floor), [place], [place] [address]  Phone: [phone] Their office will contact you by to to schedule your appointment.  Will refer you to physical therapy. You will be contacted by phone to schedule.  Follow up 12 months with Dr. Gala Romney. We will call you closer to this time, or you may call our office to schedule 1 month before you are due to be seen. Take all medication as prescribed the day of your appointment. Bring all medications with you to your appointment.  Do the following things EVERYDAY: 1) Weigh yourself in the morning before breakfast. Write it down and keep it in a log. 2) Take your medicines as prescribed 3) Eat low salt foods-Limit salt (sodium) to 2000 mg per day.  4) Stay as active as you can everyday 5) Limit all fluids for the day to less than 2 liters

## 2017-07-29 NOTE — Progress Notes (Signed)
Patient ID: Kevin [name], male   DOB: [birth year], 66 y.o.   MRN: [id number]   ADVANCED HF CLINIC NOTE  PCP: Osei-Bonsu Primary Cardiologist: Hilty  HPI:  Kevin [name] is a 66 year old male with a past medical history of PAD, CAD, severe HTN, polysubstance abuse (cocaine and ETOH) January 2017, persistent Afib (on Eliquis), TIA and Hepatitis C.   He presented to the ED via EMS on 01/31/16 early in the morning after he woke up vomiting and had an episode of incontinence. [name] noted that he was non verbal, she called EMS. When he arrived to the ED he began seizing. Code stroke was called. CT of head showed equivocal subacute right occipital infarction. No hemorrhage. Had elevated troponin of 0.75 initially, it has increased to 17.54. Cardiology was consulted. EKG when patient was admitted showed Afib RVR with rate of 170. Repeat EKG shows Afib with rate of 106 with T wave inversion in inferolateral leads, this is also noted in an EKG from 07/2015. Echo EF 35-40% with LVH (I looked at echo and feel EF more like 45-50% with AF with RVR). No cath performed. Was intubated initially in the hospital for airway protection. Discharged 02/09/2016.    He had an intermediate risk Lexiscan Myoview in December 2015, that showed moderate area of anterolateral/lateral wall ischemia. He went for a left heart cath a month later in January 2016 (report below), and he had 90% tubular mid-ramus stenosis and 70% bifurcation PDA/PLB stenosis.  Here with his wife. Says he is doing ok but complains his legs are weak. Now using a cane and occasionally a walker. No CP. Mild exertional dyspnea but says stops because his legs are tired and not SOB. No edema, orthopnea or PND.   Echo 6/17: EF 35-40%  CARDIAC CATHETERIZATION REPORT 09/27/14 Coronary Angiographic Data:  Left Main: Long vessel, angiographically normal, trifurcates into LAD, LCX and Ramus branches  Left Anterior Descending (LAD): There is a  proximal 20-30% eccentric, hazy plaque and mild mid-LAD stenosis, vessel reaches the apex  1st diagonal (D1): Smaller branch, mild diffuse disease  Circumflex (LCx): AV groove vessel, gives off 2 OM branches, mild ostial stenosis.  1st obtuse marginal: Smaller vessel, no stenosis.  2nd obtuse marginal: Minimal luminal irregularities  Ramus Intermedius: There is a tubular 90% mid-Ramus stenosis, the vessel coarses to the lateral wall  Right Coronary Artery: Dominant artery. Mild luminal irregularities.  right ventricle branch of right coronary artery: No stenosis.  posterior descending artery: There is a 70% ostial bifurcation stenosis, however, this is <2.0 mm in diameter  posterior lateral branch: Ostial stenosis (see above)    Past Medical History:  Diagnosis Date  . Atrial fibrillation, permanent (HCC) 02/01/2016  . Blood transfusion without reported diagnosis   . CAD in native artery cath 2016 90% mRamus  02/01/2016  . CHF (congestive heart failure) (HCC)   . Cholelithiases 05/01/2012  . Claudication (HCC)    bilateral  . Cocaine abuse (HCC)   . Dysrhythmia    afib  . ETOH abuse   . Hepatitis C antibody test positive 04/2012  . Hyperpigmentation   . Hypertension   . PAD (peripheral artery disease) (HCC)   . Persistent atrial fibrillation (HCC) 08/23/2014  . Thrombocytopenia (HCC)   . Tobacco abuse     Current Outpatient Medications  Medication Sig Dispense Refill  . buPROPion (WELLBUTRIN SR) 150 MG 12 hr tablet TAKE 1 TABLET BY MOUTH TWICE A DAY 60 tablet 0  .  carvedilol (COREG) 25 MG tablet Take 1 tablet (25 mg total) by mouth 2 (two) times daily. Needs office visit 60 tablet 1  . ELIQUIS 5 MG TABS tablet TAKE 1 TABLET BY MOUTH TWICE A DAY 60 tablet 5  . folic acid (FOLVITE) 1 MG tablet Take 1 tablet (1 mg total) by mouth daily. Need appointment before anymore refills 30 tablet 1  . furosemide (LASIX) 40 MG tablet Take 1 tablet (40 mg total) by mouth daily. 30  tablet 0  . levETIRAcetam (KEPPRA) 1000 MG tablet Take 1 tablet (1,000 mg total) by mouth 2 (two) times daily. 180 tablet 2  . Multiple Vitamin (MULTIVITAMIN WITH MINERALS) TABS tablet Take 1 tablet by mouth daily. Men's One a Day    . pantoprazole (PROTONIX) 40 MG tablet Take 1 tablet (40 mg total) by mouth at bedtime. 90 tablet 3  . potassium chloride SA (K-DUR,KLOR-CON) 20 MEQ tablet Take 1 tablet (20 mEq total) by mouth daily. 30 tablet 0  . rosuvastatin (CRESTOR) 20 MG tablet Take 20 mg by mouth at bedtime.    . sacubitril-valsartan (ENTRESTO) 97-103 MG Take 1 tablet by mouth 2 (two) times daily. 180 tablet 3   No current facility-administered medications for this encounter.     No Known Allergies    Social History   Socioeconomic History  . Marital status: Married    Spouse name: Stefano Gaul  . Number of children: 5  . Years of education: 24  . Highest education level: Not on file  Social Needs  . Financial resource strain: Not on file  . Food insecurity - worry: Not on file  . Food insecurity - inability: Not on file  . Transportation needs - medical: Not on file  . Transportation needs - non-medical: Not on file  Occupational History  . Occupation: Retired    Associate Professor: [employer]  Tobacco Use  . Smoking status: Current Every Day Smoker    Packs/day: 0.25    Years: 40.00    Pack years: 10.00    Types: Cigarettes  . Smokeless tobacco: Never Used  Substance and Sexual Activity  . Alcohol use: Yes    Alcohol/week: 0.0 - 9.6 oz    Comment: He used to drink up to 1/2 pint daily; 6 beers. Quit in March 2015  . Drug use: No    Comment: cocaine, marijuana  . Sexual activity: Yes    Partners: Female  Other Topics Concern  . Not on file  Social History Narrative   Lives with his wife.  Was raised by a non-related family from the age of 14 years, and so knows very little about his biological family history.      Family History  Problem Relation Age of Onset  . Seizures  Daughter   . Cancer Mother     Vitals:   07/29/17 1443  BP: 122/86  Pulse: 64  SpO2: 97%  Weight: 231 lb (104.8 kg)    PHYSICAL EXAM: General: Looks older than stated age. No resp difficulty HEENT: normal Neck: supple. no JVD. Carotids 2+ bilat; no bruits. No lymphadenopathy or thryomegaly appreciated. Cor: PMI nondisplaced. Irregular rate & rhythm. No rubs, gallops or murmurs. Lungs: clear Abdomen: soft, nontender, nondistended. No hepatosplenomegaly. No bruits or masses. Good bowel sounds. Extremities: no cyanosis, clubbing, rash, edema Large scar RLE Neuro: alert & orientedx3, cranial nerves grossly intact. moves all 4 extremities w/o difficulty. Affect pleasant  ASSESSMENT & PLAN: 1. Chronic systolic HF    --EF 35-40% by  echo 6/17   --Stable NYHA II. Volume status looks good.     --Continue current regimen   --Can consider spiro at f/u visit 2. Chronic AF   --Rate controlled. Continue Eliquis 3. CAD     --Stable. No s/s ischemic. Continue statin. No ASA with Eliquis  4. Severe HTN    --Blood pressure well controlled. Continue current regimen. 5. Polysubstance abuse  6. Tobacco use     -- quit 5/17  7. CKD stage III    --stable 8. Lower extremity claudication     --he claims about LE weakness but I had him walk down the hall with me and he did quite well. Follows with Dr. Kirke CorinArida for PAD.  So may also have component of pseudoclaudication due to spinal stenosis. Will refer to PT for further eval.   Arvilla MeresBensimhon, Skylene Deremer, MD 3:11 PM

## 2017-07-31 NOTE — Telephone Encounter (Signed)
User: Trina Ao A Date/time: 07/30/17 11:09 AM  Comment: Tried calling patient on both numbers and was unable to leave a message, home # has not VM and the cell# Vm is full.   Context:  Outcome: No Answer/Busy  Phone number: [phone] Phone Type: Home Phone  Comm. type: Telephone Call type: Outgoing  Contact: Kevin [name] Relation to patient: Self    User: Trina Ao A Date/time: 07/29/17 3:52 PM  Comment: Tried calling patient on both numbers and the home number had a VM that was full and the secondary number had no VM.Marland Kitchen  Context:  Outcome: No Answer/Busy  Phone number: [phone] Phone Type: Home Phone  Comm. type: Telephone Call type: Outgoing  Contact: Kevin [name] Relation to patient: Self

## 2017-08-05 ENCOUNTER — Other Ambulatory Visit (HOSPITAL_COMMUNITY): Payer: Managed Care, Other (non HMO)

## 2017-08-25 ENCOUNTER — Other Ambulatory Visit (HOSPITAL_COMMUNITY): Payer: Managed Care, Other (non HMO)

## 2017-09-01 ENCOUNTER — Other Ambulatory Visit (HOSPITAL_COMMUNITY): Payer: Managed Care, Other (non HMO)

## 2017-09-01 ENCOUNTER — Ambulatory Visit: Payer: Managed Care, Other (non HMO) | Admitting: Rehabilitative and Restorative Service Providers"

## 2017-09-08 ENCOUNTER — Other Ambulatory Visit (HOSPITAL_COMMUNITY): Payer: Managed Care, Other (non HMO)

## 2017-09-22 ENCOUNTER — Telehealth (HOSPITAL_COMMUNITY): Payer: Self-pay | Admitting: Internal Medicine

## 2017-09-22 NOTE — Telephone Encounter (Signed)
08/05/17 NOS for echo 09/04/17 patient cancelled echo evd 09/01/17 office closed due to weather evd 09/08/17 NOS for echo evd. 09/17/17 LMOM to resched. echo evd 09/22/17 lmsg for pt CB to get r/s for echo.Marland KitchenRG

## 2017-09-24 ENCOUNTER — Encounter (HOSPITAL_COMMUNITY): Payer: Self-pay | Admitting: Radiology

## 2017-09-26 ENCOUNTER — Telehealth (HOSPITAL_COMMUNITY): Payer: Self-pay | Admitting: Internal Medicine

## 2017-09-26 NOTE — Telephone Encounter (Signed)
08/05/17 NOS for echo 09/04/17 patient cancelled echo evd 09/01/17 office closed due to weather evd 09/08/17 NOS for echo evd. 09/17/17 LMOM to resched. echo evd 09/22/17 lmsg for pt CB to get r/s for echo.Kevin [name] 09/24/17 letter sent EVD  He will be removed from the [facility]

## 2017-10-07 ENCOUNTER — Other Ambulatory Visit (HOSPITAL_COMMUNITY): Payer: Self-pay | Admitting: Internal Medicine

## 2017-10-21 ENCOUNTER — Encounter (HOSPITAL_COMMUNITY): Payer: Self-pay

## 2017-10-21 ENCOUNTER — Telehealth (HOSPITAL_COMMUNITY): Payer: Self-pay

## 2017-10-21 NOTE — Telephone Encounter (Signed)
Advanced Heart Failure Triage Encounter  Patient Name: Kevin [name]  Date of Call: 10/21/17  Problem:  Pt wife called b/c she is retiering and they he does not have insurance and needs assistance with getting his medications.   Plan:  Per Cicero Duck pt should get samples and will sign for assistance. Pt wife aware and will come by tomorrow possibly. Entresto and eliquis samples   Teresa Coombs, RN

## 2017-10-21 NOTE — Progress Notes (Unsigned)
Medication Samples have been provided to the patient.  Drug name: Sherryll Burger       Strength: 49/51        Qty: 4  LOT: [id number]  Exp.Date: 1//21  Dosing instructions:Take 2 tabs twice a day  The patient has been instructed regarding the correct time, dose, and frequency of taking this medication, including desired effects and most common side effects.   Teresa Coombs 10:52 AM 10/21/2017   Medication Samples have been provided to the patient.  Drug name: ***       Strength: ***        Qty: ***  LOT: ***  Exp.Date: ***  Dosing instructions: ***  The patient has been instructed regarding the correct time, dose, and frequency of taking this medication, including desired effects and most common side effects.   Teresa Coombs 10:54 AM 10/21/2017

## 2017-10-22 ENCOUNTER — Encounter (HOSPITAL_COMMUNITY): Payer: Self-pay | Admitting: Pharmacist

## 2017-10-22 ENCOUNTER — Other Ambulatory Visit (HOSPITAL_COMMUNITY): Payer: Self-pay

## 2017-10-22 MED ORDER — SACUBITRIL-VALSARTAN 97-103 MG PO TABS: 1.0000 | ORAL_TABLET | Freq: Two times a day (BID) | ORAL | 3 refills | Status: DC

## 2017-10-23 ENCOUNTER — Telehealth (HOSPITAL_COMMUNITY): Payer: Self-pay | Admitting: Pharmacist

## 2017-10-23 NOTE — Telephone Encounter (Signed)
[employer] patient assistance approved for Entresto 97-103 mg BID through 10/23/18.   Tyler Deis. Bonnye Fava, PharmD, BCPS, CPP Clinical Pharmacist Phone: [phone] 10/23/2017 2:28 PM

## 2017-11-24 ENCOUNTER — Telehealth (HOSPITAL_COMMUNITY): Payer: Self-pay | Admitting: *Deleted

## 2017-11-24 NOTE — Telephone Encounter (Signed)
[name] called asking about his patient assistance approval for Eliquis 5 mg BID.  Will forward to Elizabeth Palau to look into the status.  Patient is out of medication at this time, samples left for patient to pickup at front desk.  Medication Samples have been provided to the patient.  Drug name: Eliquis       Strength: 5 mg        Qty: 2  LOT: [id number] Exp.Date: 06/21  Dosing instructions: Take 1 Tablet Twice daily  The patient has been instructed regarding the correct time, dose, and frequency of taking this medication, including desired effects and most common side effects.   Georgina Peer 1:16 PM 11/24/2017

## 2017-11-26 NOTE — Telephone Encounter (Signed)
I do not have an application for Eliquis patient assistance for patient so left VM to see if they want it sent to their home or left up front for [name] to sign.   Tyler Deis. Bonnye Fava, PharmD, BCPS, CPP Clinical Pharmacist Phone: [phone] 11/26/2017 9:36 AM

## 2017-12-30 ENCOUNTER — Other Ambulatory Visit (HOSPITAL_COMMUNITY): Payer: Self-pay | Admitting: *Deleted

## 2017-12-30 MED ORDER — APIXABAN 5 MG PO TABS: 5.0000 mg | ORAL_TABLET | Freq: Two times a day (BID) | ORAL | 3 refills | Status: DC

## 2018-01-05 ENCOUNTER — Telehealth (HOSPITAL_COMMUNITY): Payer: Self-pay | Admitting: Pharmacist

## 2018-01-05 NOTE — Telephone Encounter (Signed)
[employer] patient assistance approved for Eliquis 5 mg BID through 01/06/19.   Tyler Deis. Bonnye Fava, PharmD, BCPS, CPP Clinical Pharmacist Phone: [phone] 01/05/2018 11:10 AM

## 2018-01-12 ENCOUNTER — Telehealth (HOSPITAL_COMMUNITY): Payer: Self-pay | Admitting: Pharmacist

## 2018-01-12 NOTE — Telephone Encounter (Signed)
[employer] patient assistance has been trying to reach [name] to schedule shipment of his Eliquis. I have called him and left a VM to call [employer] directly at [phone].  Tyler Deis. Bonnye Fava, PharmD, BCPS, CPP Clinical Pharmacist Phone: [phone] 01/12/2018 9:42 AM

## 2018-01-29 ENCOUNTER — Other Ambulatory Visit (HOSPITAL_COMMUNITY): Payer: Self-pay | Admitting: Internal Medicine

## 2018-04-02 ENCOUNTER — Telehealth (HOSPITAL_COMMUNITY): Payer: Self-pay | Admitting: *Deleted

## 2018-04-02 ENCOUNTER — Other Ambulatory Visit (HOSPITAL_COMMUNITY): Payer: Self-pay | Admitting: *Deleted

## 2018-04-02 MED ORDER — APIXABAN 5 MG PO TABS: 5.0000 mg | ORAL_TABLET | Freq: Two times a day (BID) | ORAL | 3 refills | Status: DC

## 2018-04-02 MED ORDER — SACUBITRIL-VALSARTAN 97-103 MG PO TABS: 1.0000 | ORAL_TABLET | Freq: Two times a day (BID) | ORAL | 3 refills | Status: DC

## 2018-04-02 NOTE — Telephone Encounter (Signed)
[name] called asking for Entresto samples, we are out of samples at this time.  I tried calling her back but had to leave VM asking for her to call us back.

## 2018-05-09 ENCOUNTER — Other Ambulatory Visit (HOSPITAL_COMMUNITY): Payer: Self-pay | Admitting: Internal Medicine

## 2018-06-17 DIAGNOSIS — J410 Simple chronic bronchitis: Secondary | ICD-10-CM | POA: Diagnosis not present

## 2018-06-17 DIAGNOSIS — I480 Paroxysmal atrial fibrillation: Secondary | ICD-10-CM | POA: Diagnosis not present

## 2018-06-17 DIAGNOSIS — Z131 Encounter for screening for diabetes mellitus: Secondary | ICD-10-CM | POA: Diagnosis not present

## 2018-06-17 DIAGNOSIS — Z125 Encounter for screening for malignant neoplasm of prostate: Secondary | ICD-10-CM | POA: Diagnosis not present

## 2018-06-17 DIAGNOSIS — I509 Heart failure, unspecified: Secondary | ICD-10-CM | POA: Diagnosis not present

## 2018-06-17 DIAGNOSIS — Z720 Tobacco use: Secondary | ICD-10-CM | POA: Diagnosis not present

## 2018-06-17 DIAGNOSIS — I2510 Atherosclerotic heart disease of native coronary artery without angina pectoris: Secondary | ICD-10-CM | POA: Diagnosis not present

## 2018-06-17 DIAGNOSIS — Z0110 Encounter for examination of ears and hearing without abnormal findings: Secondary | ICD-10-CM | POA: Diagnosis not present

## 2018-06-17 DIAGNOSIS — N529 Male erectile dysfunction, unspecified: Secondary | ICD-10-CM | POA: Diagnosis not present

## 2018-06-17 DIAGNOSIS — Z136 Encounter for screening for cardiovascular disorders: Secondary | ICD-10-CM | POA: Diagnosis not present

## 2018-06-17 DIAGNOSIS — E785 Hyperlipidemia, unspecified: Secondary | ICD-10-CM | POA: Diagnosis not present

## 2018-06-17 DIAGNOSIS — I10 Essential (primary) hypertension: Secondary | ICD-10-CM | POA: Diagnosis not present

## 2018-07-03 DIAGNOSIS — G40802 Other epilepsy, not intractable, without status epilepticus: Secondary | ICD-10-CM | POA: Diagnosis not present

## 2018-07-03 DIAGNOSIS — R531 Weakness: Secondary | ICD-10-CM | POA: Diagnosis not present

## 2018-07-22 DIAGNOSIS — I2510 Atherosclerotic heart disease of native coronary artery without angina pectoris: Secondary | ICD-10-CM | POA: Diagnosis not present

## 2018-07-22 DIAGNOSIS — Z720 Tobacco use: Secondary | ICD-10-CM | POA: Diagnosis not present

## 2018-07-22 DIAGNOSIS — Z23 Encounter for immunization: Secondary | ICD-10-CM | POA: Diagnosis not present

## 2018-07-22 DIAGNOSIS — I10 Essential (primary) hypertension: Secondary | ICD-10-CM | POA: Diagnosis not present

## 2018-07-22 DIAGNOSIS — N529 Male erectile dysfunction, unspecified: Secondary | ICD-10-CM | POA: Diagnosis not present

## 2018-07-22 DIAGNOSIS — I509 Heart failure, unspecified: Secondary | ICD-10-CM | POA: Diagnosis not present

## 2018-07-22 DIAGNOSIS — I480 Paroxysmal atrial fibrillation: Secondary | ICD-10-CM | POA: Diagnosis not present

## 2018-07-22 DIAGNOSIS — J410 Simple chronic bronchitis: Secondary | ICD-10-CM | POA: Diagnosis not present

## 2018-07-22 DIAGNOSIS — E785 Hyperlipidemia, unspecified: Secondary | ICD-10-CM | POA: Diagnosis not present

## 2018-09-30 DIAGNOSIS — H269 Unspecified cataract: Secondary | ICD-10-CM | POA: Diagnosis not present

## 2018-11-05 ENCOUNTER — Other Ambulatory Visit (HOSPITAL_COMMUNITY): Payer: Self-pay

## 2018-11-05 ENCOUNTER — Telehealth (HOSPITAL_COMMUNITY): Payer: Self-pay | Admitting: Licensed Clinical Social Worker

## 2018-11-05 MED ORDER — SACUBITRIL-VALSARTAN 97-103 MG PO TABS
1.0000 | ORAL_TABLET | Freq: Two times a day (BID) | ORAL | 3 refills | Status: DC
Start: 2018-11-05 — End: 2018-11-05

## 2018-11-05 MED ORDER — SACUBITRIL-VALSARTAN 97-103 MG PO TABS: 1.0000 | ORAL_TABLET | Freq: Two times a day (BID) | ORAL | 3 refills | Status: DC

## 2018-11-05 NOTE — Telephone Encounter (Signed)
Completed [employer] application for Entresto assistance faxed in for review- fax confirmation received  Burna Sis, LCSW Clinical Social Worker [facility] [phone]

## 2018-11-10 DIAGNOSIS — H25813 Combined forms of age-related cataract, bilateral: Secondary | ICD-10-CM | POA: Diagnosis not present

## 2018-11-10 DIAGNOSIS — H43811 Vitreous degeneration, right eye: Secondary | ICD-10-CM | POA: Diagnosis not present

## 2018-11-10 DIAGNOSIS — Z01818 Encounter for other preprocedural examination: Secondary | ICD-10-CM | POA: Diagnosis not present

## 2018-11-10 DIAGNOSIS — H25811 Combined forms of age-related cataract, right eye: Secondary | ICD-10-CM | POA: Diagnosis not present

## 2018-11-10 DIAGNOSIS — H25812 Combined forms of age-related cataract, left eye: Secondary | ICD-10-CM | POA: Diagnosis not present

## 2018-11-26 ENCOUNTER — Other Ambulatory Visit (HOSPITAL_COMMUNITY): Payer: Self-pay

## 2018-11-26 ENCOUNTER — Telehealth (HOSPITAL_COMMUNITY): Payer: Self-pay | Admitting: Licensed Clinical Social Worker

## 2018-11-26 MED ORDER — SACUBITRIL-VALSARTAN 97-103 MG PO TABS: 1.0000 | ORAL_TABLET | Freq: Two times a day (BID) | ORAL | 2 refills | Status: DC

## 2018-11-26 NOTE — Telephone Encounter (Signed)
CSW consulted to assist with Entresto concerns.  CSW spoke with [employer] who states that they have everything they need for the application but are still in the process of doing their financial check.    [employer] states they can give pt temporary medication while their application is being processed but they have not received request from pt for this.  CSW sent in updated prescription to [facility] so temporary shipment can be sent- requested it be expedited.  CSW updated pt and wife- will plan to follow up tomorrow to ensure prescription has been received and request expedited shipment  CSW will continue to follow and assist as needed  Burna Sis, LCSW Clinical Social Worker [facility] [phone]

## 2018-12-16 ENCOUNTER — Telehealth (HOSPITAL_COMMUNITY): Payer: Self-pay | Admitting: Licensed Clinical Social Worker

## 2018-12-16 NOTE — Telephone Encounter (Signed)
Notification received that pt has been approved for entresto assistance through [facility]  Pt ID: [id number] Expiration: 09/23/2019  CSW called pt and left a message to inform him of approval- provided number to [employer] so he can call and ensure that his initial shipment is set up.  CSW will continue to follow and assist as needed  Burna Sis, LCSW Clinical Social Worker [facility] Desk#: [phone] Cell#: [phone]

## 2019-01-01 ENCOUNTER — Ambulatory Visit (HOSPITAL_COMMUNITY)
Admission: RE | Admit: 2019-01-01 | Discharge: 2019-01-01 | Disposition: A | Discharge status: Home / Self Care | Payer: Medicare HMO | Source: Ambulatory Visit | Attending: Internal Medicine | Admitting: Internal Medicine

## 2019-01-01 ENCOUNTER — Other Ambulatory Visit: Payer: Self-pay

## 2019-01-01 DIAGNOSIS — I480 Paroxysmal atrial fibrillation: Secondary | ICD-10-CM

## 2019-01-01 DIAGNOSIS — I5022 Chronic systolic (congestive) heart failure: Secondary | ICD-10-CM

## 2019-01-01 DIAGNOSIS — I2510 Atherosclerotic heart disease of native coronary artery without angina pectoris: Secondary | ICD-10-CM

## 2019-01-01 DIAGNOSIS — I482 Chronic atrial fibrillation, unspecified: Secondary | ICD-10-CM

## 2019-01-01 DIAGNOSIS — I10 Essential (primary) hypertension: Secondary | ICD-10-CM

## 2019-01-01 NOTE — Progress Notes (Signed)
Spoke to the patient and his wife about the patient's after visit summary instructions. Verbalized understanding. Denies needs for refills. No further questions at this time.

## 2019-01-01 NOTE — Progress Notes (Signed)
Heart Failure TeleHealth Note  Due to national recommendations of social distancing due to COVID 19, Audio/video telehealth visit is felt to be most appropriate for this patient at this time.  See MyChart message from today for patient consent regarding telehealth for [facility].  Date:  01/01/2019   ID:  STANCIL COELHO, DOB [birth year], MRN [id number]  Location: Home  Provider location: [facility] Type of Visit: Established patient  PCP:  Jackie Plum, MD  Cardiologist:  No primary care provider on file. Primary HF: Bensimhon  Chief Complaint: Heart Failure follow-up   History of Present Illness:  Kevin [name] is a 68 year old male with a past medical history of PAD, CAD, severe HTN, polysubstance abuse (cocaine and ETOH) January 2017, persistent Afib (on Eliquis), TIA and Hepatitis C.   We have not seen him since 11/18. He presents today for telehealth visit.   He had an intermediate risk Lexiscan Myoview in December 2015, that showed moderate area of anterolateral/lateral wall ischemia. He went for a left heart cath a month later in January 2016 (report below), and he had 90% tubular mid-ramus stenosis and 70% bifurcation PDA/PLB stenosis. Treated medically.  He presented to the ED via EMS on 01/31/16 after he woke up vomiting and incontinence. [name] noted that he was non verbal, she called EMS. When he arrived to the ED he began seizing. Code stroke was called. CT of head showed equivocal subacute right occipital infarction. No hemorrhage. Had elevated troponin of 0.75 initially, it has increased to 17.54. Cardiology was consulted. EKG when patient was admitted showed Afib RVR with rate of 170. Echo EF 35-40% with LVH (I looked at echo and feel EF more like 45-50% with AF with RVR). No cath performed.    Says he's coming along pretty good. Taking his medications as prescribed. Denies CP, mild DOE which is chronic, no orthopnea  or PND. No edema. Can do all ADLs but will use a walker to support his legs. Says BP usually 120-130s. Weight down to 210. (lost 15 pounds). No bleeding with Eliquis  Smoking 1-2 cigarettes/day.    Echo 6/17: EF 35-40%  CARDIAC CATHETERIZATION 09/27/14   Left Main: Long vessel, angiographically normal, trifurcates into LAD, LCX and Ramus branches  Left Anterior Descending (LAD): There is a proximal 20-30% eccentric, hazy plaque and mild mid-LAD stenosis, vessel reaches the apex  1st diagonal (D1): Smaller branch, mild diffuse disease  Circumflex (LCx): AV groove vessel, gives off 2 OM branches, mild ostial stenosis.  1st obtuse marginal: Smaller vessel, no stenosis.  2nd obtuse marginal: Minimal luminal irregularities  Ramus Intermedius: There is a tubular 90% mid-Ramus stenosis, the vessel coarses to the lateral wall  Right Coronary Artery: Dominant artery. Mild luminal irregularities.  right ventricle branch of right coronary artery: No stenosis.  posterior descending artery: There is a 70% ostial bifurcation stenosis, however, this is <2.0 mm in diameter  posterior lateral branch: Ostial stenosis (see above)   Kevin [name] denies symptoms worrisome for COVID 19.   Past Medical History:  Diagnosis Date  . Atrial fibrillation, permanent (HCC) 02/01/2016  . Blood transfusion without reported diagnosis   . CAD in native artery cath 2016 90% mRamus  02/01/2016  . CHF (congestive heart failure) (HCC)   . Cholelithiases 05/01/2012  . Claudication (HCC)    bilateral  . Cocaine abuse (HCC)   . Dysrhythmia    afib  . ETOH abuse   .  Hepatitis C antibody test positive 04/2012  . Hyperpigmentation   . Hypertension   . PAD (peripheral artery disease) (HCC)   . Persistent atrial fibrillation (HCC) 08/23/2014  . Thrombocytopenia (HCC)   . Tobacco abuse    Past Surgical History:  Procedure Laterality Date  . Cardiolite     negative for ischemia  . CARDIOVASCULAR  STRESS TEST    . CARDIOVERSION  05/05/2012   Procedure: CARDIOVERSION;  Surgeon: Thurmon FairMihai Croitoru, MD;  Location: [facility] ENDOSCOPY;  Service: Cardiovascular;  Laterality: N/A;  . CAROTID ARTERY ANGIOPLASTY    . DOPPLER ECHOCARDIOGRAPHY    . ENDOVASCULAR STENT INSERTION  right leg  . LEFT HEART CATHETERIZATION WITH CORONARY ANGIOGRAM N/A 05/01/2012   Procedure: LEFT HEART CATHETERIZATION WITH CORONARY ANGIOGRAM;  Surgeon: Marykay Lexavid W Harding, MD;  [facility];  Service: Cardiovascular;  Laterality: N/A;  . LEFT HEART CATHETERIZATION WITH CORONARY ANGIOGRAM N/A 09/27/2014   Procedure: LEFT HEART CATHETERIZATION WITH CORONARY ANGIOGRAM;  Surgeon: Chrystie NoseKenneth C. Hilty, MD;  [facility];  Service: Cardiovascular;  Laterality: N/A;  . TEE WITHOUT CARDIOVERSION  05/05/2012   Procedure: TRANSESOPHAGEAL ECHOCARDIOGRAM (TEE);  Surgeon: Thurmon FairMihai Croitoru, MD;  [facility];  Service: Cardiovascular;  Laterality: N/A;     Current Outpatient Medications  Medication Sig Dispense Refill  . apixaban (ELIQUIS) 5 MG TABS tablet Take 1 tablet (5 mg total) by mouth 2 (two) times daily. 180 tablet 3  . buPROPion (WELLBUTRIN SR) 150 MG 12 hr tablet TAKE 1 TABLET BY MOUTH TWICE A DAY 60 tablet 0  . carvedilol (COREG) 25 MG tablet TAKE 1 TABLET BY MOUTH 2 TIMES DAILY WITH A MEAL. 60 tablet 2  . folic acid (FOLVITE) 1 MG tablet Take 1 tablet (1 mg total) by mouth daily. Need appointment before anymore refills 30 tablet 1  . furosemide (LASIX) 40 MG tablet Take 1 tablet (40 mg total) by mouth daily. 30 tablet 0  . levETIRAcetam (KEPPRA) 1000 MG tablet Take 1 tablet (1,000 mg total) by mouth 2 (two) times daily. 180 tablet 2  . Multiple Vitamin (MULTIVITAMIN WITH MINERALS) TABS tablet Take 1 tablet by mouth daily. Men's One a Day    . pantoprazole (PROTONIX) 40 MG tablet Take 1 tablet (40 mg total) by mouth at bedtime. 90 tablet 3  . potassium chloride SA (K-DUR,KLOR-CON) 20 MEQ tablet Take 1 tablet (20 mEq  total) by mouth daily. 30 tablet 0  . rosuvastatin (CRESTOR) 20 MG tablet Take 20 mg by mouth at bedtime.    . sacubitril-valsartan (ENTRESTO) 97-103 MG Take 1 tablet by mouth 2 (two) times daily. 60 tablet 2   No current facility-administered medications for this encounter.     Allergies:   Patient has no known allergies.   Social History:  The patient  reports that he has been smoking cigarettes. He has a 10.00 pack-year smoking history. He has never used smokeless tobacco. He reports current alcohol use. He reports that he does not use drugs.   Family History:  The patient's family history includes Cancer in his mother; Seizures in his daughter.   ROS:  Please see the history of present illness.   All other systems are personally reviewed and negative.   Exam:  (Video/Tele Health Call; Exam is subjective and or/visual.) General:  Speaks in full sentences. No resp difficulty. Lungs: Normal respiratory effort with conversation.  Abdomen: Non-distended per patient report Extremities: Pt denies edema. Neuro: Alert & oriented x 3.   Recent Labs: No  results found for requested labs within last 8760 hours.  Personally reviewed   Wt Readings from Last 3 Encounters:  07/29/17 104.8 kg (231 lb)  06/26/16 103.1 kg (227 lb 6.4 oz)  06/18/16 104.1 kg (229 lb 9.6 oz)      ASSESSMENT AND PLAN:  1. Chronic systolic HF    - EF 35-40% by echo 6/17   - Stable NYHA II-III. Volume status stable. Weight down   - Continue current regimen with Entresto, carvedilol and lasix    - Can consider spiro or Bidil at f/u visit   - See back 2-3 months with echo 2. Chronic AF   - Rate controlled. Continue Eliquis. No bleeding 3. CAD     - Stable. No symptoms of ischemia. Continue statin. No ASA with Eliquis  4. Severe HTN    - Blood pressure well controlled. Continue current regimen. 5. Polysubstance abuse  6. Tobacco use     - back smoking a few per day. Encouraged cessation.  7. CKD stage III     - check labs at next visit  8. Lower extremity claudication     - Follows with Dr. Kirke Corin for PAD. Legs weak but no reported claudication   COVID screen The patient does not have any symptoms that suggest any further testing/ screening at this time.  Social distancing reinforced today.  Recommended follow-up:  As above  Relevant cardiac medications were reviewed at length with the patient today.   The patient does not have concerns regarding their medications at this time.   The following changes were made today:  As above  Today, I have spent 24 minutes with the patient with telehealth technology discussing the above issues .    Signed, Arvilla Meres, MD  01/01/2019 11:57 AM  [facility] [address] [facility] [place] [address] [phone] (office) [phone] (fax)

## 2019-01-01 NOTE — Addendum Note (Signed)
Encounter addended by: Nicole Cella, RN on: 01/01/2019 1:19 PM  Actions taken: Order list changed, Diagnosis association updated, Clinical Note Signed

## 2019-01-01 NOTE — Patient Instructions (Signed)
Your physician has requested that you have an echocardiogram. Echocardiography is a painless test that uses sound waves to create images of your heart. It provides your doctor with information about the size and shape of your heart and how well your heart's chambers and valves are working. This procedure takes approximately one hour. There are no restrictions for this procedure. This will be done at your follow up appointment with Dr. Gala Romney.  Please follow up with Dr. Gala Romney in 2-3 months with a echocardiogram.

## 2019-01-01 NOTE — Addendum Note (Signed)
Encounter addended by: Nicole Cella, RN on: 01/01/2019 1:24 PM  Actions taken: Clinical Note Signed

## 2019-01-05 ENCOUNTER — Encounter (HOSPITAL_COMMUNITY): Payer: Managed Care, Other (non HMO) | Admitting: Internal Medicine

## 2019-02-22 ENCOUNTER — Other Ambulatory Visit (HOSPITAL_COMMUNITY): Payer: Self-pay | Admitting: *Deleted

## 2019-02-22 MED ORDER — APIXABAN 5 MG PO TABS: 5.0000 mg | ORAL_TABLET | Freq: Two times a day (BID) | ORAL | 3 refills | Status: DC

## 2019-02-24 ENCOUNTER — Telehealth (HOSPITAL_COMMUNITY): Payer: Self-pay | Admitting: Licensed Clinical Social Worker

## 2019-02-24 NOTE — Telephone Encounter (Signed)
2 boxes of Eliquis samples place up at the front to be picked up by the patient. Lot number: [id number]. Expiration: 09/22

## 2019-02-24 NOTE — Telephone Encounter (Signed)
CSW consulted to check in regarding patients [employer] foundation application.  CSW called foundation who report pt will need to reach $259.11 out of pocket expenses for 2020 in order to qualify for assistance.  CSW called pt pharmacy and confirmed that pt has spent $42.88 so far for  2020 and that Eliquis copay would be $45/month if filled through them  CSW called and discussed with pt wife.  CSW explained that $45 is likely the lowest we can get for pt copay so tier exception would not be helpful.  Provided pt wife with number for [employer] Information [employer]ing so they can attempt to apply for Extra Help program which would potentially help with copay cost.k  CSW sent message to clinic to inquire about pt getting Eliquis samples to tide them over until next week when pt wife gets her check.  CSW will continue to follow and assist as needed  Burna Sis, LCSW Clinical Social Worker [facility] Desk#: [phone] Cell#: [phone]

## 2019-03-19 ENCOUNTER — Telehealth (HOSPITAL_COMMUNITY): Payer: Self-pay | Admitting: Vascular Surgery

## 2019-03-19 NOTE — Telephone Encounter (Signed)
Left pt message to move 7/17 appt echo/f/u w/ db yo another day and time, asked pt to call back to reschedule THESE APPT

## 2019-04-09 ENCOUNTER — Inpatient Hospital Stay (HOSPITAL_COMMUNITY)
Admission: RE | Admit: 2019-04-09 | Discharge: 2019-04-09 | Disposition: A | Discharge status: Home / Self Care | Payer: Medicare HMO | Source: Ambulatory Visit | Attending: Internal Medicine | Admitting: Internal Medicine

## 2019-04-09 ENCOUNTER — Ambulatory Visit (HOSPITAL_COMMUNITY): Admission: RE | Admit: 2019-04-09 | Payer: Medicare HMO | Source: Ambulatory Visit

## 2019-06-17 DIAGNOSIS — Z720 Tobacco use: Secondary | ICD-10-CM | POA: Diagnosis not present

## 2019-06-17 DIAGNOSIS — Z23 Encounter for immunization: Secondary | ICD-10-CM | POA: Diagnosis not present

## 2019-06-17 DIAGNOSIS — I509 Heart failure, unspecified: Secondary | ICD-10-CM | POA: Diagnosis not present

## 2019-06-17 DIAGNOSIS — I2510 Atherosclerotic heart disease of native coronary artery without angina pectoris: Secondary | ICD-10-CM | POA: Diagnosis not present

## 2019-06-17 DIAGNOSIS — Z131 Encounter for screening for diabetes mellitus: Secondary | ICD-10-CM | POA: Diagnosis not present

## 2019-06-17 DIAGNOSIS — Z0000 Encounter for general adult medical examination without abnormal findings: Secondary | ICD-10-CM | POA: Diagnosis not present

## 2019-06-17 DIAGNOSIS — E785 Hyperlipidemia, unspecified: Secondary | ICD-10-CM | POA: Diagnosis not present

## 2019-06-17 DIAGNOSIS — Z125 Encounter for screening for malignant neoplasm of prostate: Secondary | ICD-10-CM | POA: Diagnosis not present

## 2019-06-17 DIAGNOSIS — I10 Essential (primary) hypertension: Secondary | ICD-10-CM | POA: Diagnosis not present

## 2019-06-17 DIAGNOSIS — Z0101 Encounter for examination of eyes and vision with abnormal findings: Secondary | ICD-10-CM | POA: Diagnosis not present

## 2019-06-18 ENCOUNTER — Other Ambulatory Visit (HOSPITAL_COMMUNITY): Payer: Self-pay

## 2019-06-18 MED ORDER — APIXABAN 5 MG PO TABS: 5.0000 mg | ORAL_TABLET | Freq: Two times a day (BID) | ORAL | 3 refills | Status: DC

## 2019-06-22 DIAGNOSIS — Z0001 Encounter for general adult medical examination with abnormal findings: Secondary | ICD-10-CM | POA: Diagnosis not present

## 2019-06-22 DIAGNOSIS — I10 Essential (primary) hypertension: Secondary | ICD-10-CM | POA: Diagnosis not present

## 2019-06-22 DIAGNOSIS — E785 Hyperlipidemia, unspecified: Secondary | ICD-10-CM | POA: Diagnosis not present

## 2019-06-22 DIAGNOSIS — Z1389 Encounter for screening for other disorder: Secondary | ICD-10-CM | POA: Diagnosis not present

## 2019-06-22 DIAGNOSIS — J410 Simple chronic bronchitis: Secondary | ICD-10-CM | POA: Diagnosis not present

## 2019-06-22 DIAGNOSIS — I480 Paroxysmal atrial fibrillation: Secondary | ICD-10-CM | POA: Diagnosis not present

## 2019-06-22 DIAGNOSIS — Z720 Tobacco use: Secondary | ICD-10-CM | POA: Diagnosis not present

## 2019-06-22 DIAGNOSIS — I2510 Atherosclerotic heart disease of native coronary artery without angina pectoris: Secondary | ICD-10-CM | POA: Diagnosis not present

## 2019-06-22 DIAGNOSIS — I509 Heart failure, unspecified: Secondary | ICD-10-CM | POA: Diagnosis not present

## 2019-06-24 ENCOUNTER — Ambulatory Visit (HOSPITAL_COMMUNITY)
Admission: RE | Admit: 2019-06-24 | Discharge: 2019-06-24 | Disposition: A | Discharge status: Home / Self Care | Payer: Medicare HMO | Source: Ambulatory Visit | Attending: Internal Medicine | Admitting: Internal Medicine

## 2019-07-15 ENCOUNTER — Encounter (HOSPITAL_COMMUNITY): Payer: Medicare HMO | Admitting: Internal Medicine

## 2019-08-13 ENCOUNTER — Telehealth (HOSPITAL_COMMUNITY): Payer: Self-pay | Admitting: Pharmacy Technician

## 2019-08-13 NOTE — Telephone Encounter (Signed)
It's time to re-enroll patient to receive medication assistance for Entresto from [employer]. Called patient several times, line was busy.  Will follow up.  Charlann Boxer, CPhT

## 2019-10-12 NOTE — Telephone Encounter (Signed)
Have been unsuccessful in reaching patient regarding patient assistance. Will be available in the future as needed.  Archer Asa, CPhT

## 2019-10-27 DIAGNOSIS — J410 Simple chronic bronchitis: Secondary | ICD-10-CM | POA: Diagnosis not present

## 2019-10-27 DIAGNOSIS — E785 Hyperlipidemia, unspecified: Secondary | ICD-10-CM | POA: Diagnosis not present

## 2019-10-27 DIAGNOSIS — I509 Heart failure, unspecified: Secondary | ICD-10-CM | POA: Diagnosis not present

## 2019-10-27 DIAGNOSIS — Z720 Tobacco use: Secondary | ICD-10-CM | POA: Diagnosis not present

## 2019-10-27 DIAGNOSIS — I480 Paroxysmal atrial fibrillation: Secondary | ICD-10-CM | POA: Diagnosis not present

## 2019-10-27 DIAGNOSIS — I10 Essential (primary) hypertension: Secondary | ICD-10-CM | POA: Diagnosis not present

## 2019-10-27 DIAGNOSIS — I2510 Atherosclerotic heart disease of native coronary artery without angina pectoris: Secondary | ICD-10-CM | POA: Diagnosis not present

## 2019-10-27 DIAGNOSIS — Z131 Encounter for screening for diabetes mellitus: Secondary | ICD-10-CM | POA: Diagnosis not present

## 2019-11-18 ENCOUNTER — Other Ambulatory Visit (HOSPITAL_COMMUNITY): Payer: Self-pay | Admitting: *Deleted

## 2019-11-18 MED ORDER — ENTRESTO 97-103 MG PO TABS: 1.0000 | ORAL_TABLET | Freq: Two times a day (BID) | ORAL | 2 refills | Status: DC

## 2019-11-29 ENCOUNTER — Telehealth: Payer: Self-pay | Admitting: Hematology and Oncology

## 2019-11-29 NOTE — Telephone Encounter (Signed)
Received a new hem referral from Dr. Julio Sicks for thrombocytopenia. An appt has been scheduled for Kevin [name] to see Dr. Pamelia Hoit on 3/24 at 345pm. Appt date and time has been given to the pt's wife. Aware to arrive 15 minutes early.

## 2019-11-30 ENCOUNTER — Telehealth (HOSPITAL_COMMUNITY): Payer: Self-pay | Admitting: Pharmacist

## 2019-11-30 NOTE — Telephone Encounter (Signed)
Received message from patient's wife that he is running low on Entresto. Determined that he receives Entresto from [employer] and it is time for renewal. Left patient application at front desk for patient to sign. Once application is complete, will send in to [employer].   Also signed him up for the [employer] [facility] [employer] waitlist. His current Entresto copay is $45.00.   Karle Plumber, PharmD, BCPS, BCCP, CPP [facility] Pharmacist [phone]

## 2019-12-13 ENCOUNTER — Institutional Professional Consult (permissible substitution): Payer: Medicare HMO | Admitting: Pulmonary Disease

## 2019-12-14 ENCOUNTER — Telehealth (HOSPITAL_COMMUNITY): Payer: Self-pay | Admitting: Pharmacist

## 2019-12-14 NOTE — Telephone Encounter (Signed)
Patient never came in to sign patient portion of [employer] application for [employer]. However, I was able to obtain a grant through the [employer] to assist with his copay (see telephone note 12/14/19). Will place patient assistance application on hold and use the grant. Left VM for patient.   Karle Plumber, PharmD, BCPS, BCCP, CPP [facility] Pharmacist [phone]

## 2019-12-14 NOTE — Telephone Encounter (Signed)
Obtained [employer] Heart Failure grant for copay assistance with Sherryll Burger:   ID: [id number] Bin: [id number] PCN: [id number] Group: [id number] Genelle Gather Amount: $2,500.00 Start Date: 11/14/19 End Date: 11/12/20  Karle Plumber, PharmD, BCPS, BCCP, CPP [facility] Clinic Pharmacist [phone]

## 2019-12-14 NOTE — Progress Notes (Signed)
[facility] CONSULT NOTE  Patient Care Team: Norm Salt, [place] as PCP - General (Physician Assistant)  CHIEF COMPLAINTS/PURPOSE OF CONSULTATION:  Newly diagnosed thrombocytopenia  HISTORY OF PRESENTING ILLNESS:  Kevin [name] 69 y.o. male is here because of recent diagnosis of thrombocytopenia. He is referred by Dr. Julio Sicks. Labs on 06/17/19 showed platelets 49 and on 10/27/19 showed platelets 76. He presents to the clinic today for initial evaluation.   Patient has a history of alcoholism and still consumes a regular alcohol.  He has a known cirrhosis of the liver and has had chronic thrombocytopenia even in 2017 he had a platelet count of 76.  Most recently it has fluctuated between 46 and 76.  He has been referred to [place] for evaluation and discussion of the causes of thrombocytopenia and its medical management.  He is here today accompanied by his wife.  He has multiple chronic medical illnesses.  I reviewed his records extensively and collaborated the history with the patient.  MEDICAL HISTORY:  Past Medical History:  Diagnosis Date  . Atrial fibrillation, permanent (HCC) 02/01/2016  . Blood transfusion without reported diagnosis   . CAD in native artery cath 2016 90% mRamus  02/01/2016  . CHF (congestive heart failure) (HCC)   . Cholelithiases 05/01/2012  . Claudication (HCC)    bilateral  . Cocaine abuse (HCC)   . Dysrhythmia    afib  . ETOH abuse   . Hepatitis C antibody test positive 04/2012  . Hyperpigmentation   . Hypertension   . PAD (peripheral artery disease) (HCC)   . Persistent atrial fibrillation (HCC) 08/23/2014  . Thrombocytopenia (HCC)   . Tobacco abuse     SURGICAL HISTORY: Past Surgical History:  Procedure Laterality Date  . Cardiolite     negative for ischemia  . CARDIOVASCULAR STRESS TEST    . CARDIOVERSION  05/05/2012   Procedure: CARDIOVERSION;  Surgeon: Thurmon Fair, MD;  Location: [facility] ENDOSCOPY;  Service: Cardiovascular;   Laterality: N/A;  . CAROTID ARTERY ANGIOPLASTY    . DOPPLER ECHOCARDIOGRAPHY    . ENDOVASCULAR STENT INSERTION  right leg  . LEFT HEART CATHETERIZATION WITH CORONARY ANGIOGRAM N/A 05/01/2012   Procedure: LEFT HEART CATHETERIZATION WITH CORONARY ANGIOGRAM;  Surgeon: Marykay Lex, MD;  Location: [facility] CATH LAB;  Service: Cardiovascular;  Laterality: N/A;  . LEFT HEART CATHETERIZATION WITH CORONARY ANGIOGRAM N/A 09/27/2014   Procedure: LEFT HEART CATHETERIZATION WITH CORONARY ANGIOGRAM;  Surgeon: Chrystie Nose, MD;  Location: [facility] CATH LAB;  Service: Cardiovascular;  Laterality: N/A;  . TEE WITHOUT CARDIOVERSION  05/05/2012   Procedure: TRANSESOPHAGEAL ECHOCARDIOGRAM (TEE);  Surgeon: Thurmon Fair, MD;  Location: [facility] ENDOSCOPY;  Service: Cardiovascular;  Laterality: N/A;    SOCIAL HISTORY: Social History   Socioeconomic History  . Marital status: Married    Spouse name: Stefano Gaul  . Number of children: 5  . Years of education: 36  . Highest education level: Not on file  Occupational History  . Occupation: Retired    Associate Professor: [employer]  Tobacco Use  . Smoking status: Current Every Day Smoker    Packs/day: 0.25    Years: 40.00    Pack years: 10.00    Types: Cigarettes  . Smokeless tobacco: Never Used  Substance and Sexual Activity  . Alcohol use: Yes    Alcohol/week: 0.0 - 16.0 standard drinks    Comment: He used to drink up to 1/2 pint daily; 6 beers. Quit in March 2015  . Drug use: No  Comment: cocaine, marijuana  . Sexual activity: Yes    Partners: Female  Other Topics Concern  . Not on file  Social History Narrative   Lives with his wife.  Was raised by a non-related family from the age of 14 years, and so knows very little about his biological family history.   Social Determinants of Health   Financial Resource Strain:   . Difficulty of Paying Living Expenses:   Food Insecurity:   . Worried About Programme researcher, broadcasting/film/video in the Last Year:   . Barista in the Last  Year:   Transportation Needs:   . Freight forwarder (Medical):   Marland Kitchen Lack of Transportation (Non-Medical):   Physical Activity:   . Days of Exercise per Week:   . Minutes of Exercise per Session:   Stress:   . Feeling of Stress :   Social Connections:   . Frequency of Communication with Friends and Family:   . Frequency of Social Gatherings with Friends and Family:   . Attends Religious Services:   . Active Member of Clubs or Organizations:   . Attends Banker Meetings:   Marland Kitchen Marital Status:   Intimate Partner Violence:   . Fear of Current or Ex-Partner:   . Emotionally Abused:   Marland Kitchen Physically Abused:   . Sexually Abused:     FAMILY HISTORY: Family History  Problem Relation Age of Onset  . Seizures Daughter   . Cancer Mother     ALLERGIES:  has No Known Allergies.  MEDICATIONS:  Current Outpatient Medications  Medication Sig Dispense Refill  . apixaban (ELIQUIS) 5 MG TABS tablet Take 1 tablet (5 mg total) by mouth 2 (two) times daily. 180 tablet 3  . buPROPion (WELLBUTRIN SR) 150 MG 12 hr tablet TAKE 1 TABLET BY MOUTH TWICE A DAY 60 tablet 0  . carvedilol (COREG) 25 MG tablet TAKE 1 TABLET BY MOUTH 2 TIMES DAILY WITH A MEAL. 60 tablet 2  . folic acid (FOLVITE) 1 MG tablet Take 1 tablet (1 mg total) by mouth daily. Need appointment before anymore refills 30 tablet 1  . furosemide (LASIX) 40 MG tablet Take 1 tablet (40 mg total) by mouth daily. 30 tablet 0  . levETIRAcetam (KEPPRA) 1000 MG tablet Take 1 tablet (1,000 mg total) by mouth 2 (two) times daily. 180 tablet 2  . Multiple Vitamin (MULTIVITAMIN WITH MINERALS) TABS tablet Take 1 tablet by mouth daily. Men's One a Day    . pantoprazole (PROTONIX) 40 MG tablet Take 1 tablet (40 mg total) by mouth at bedtime. 90 tablet 3  . potassium chloride SA (K-DUR,KLOR-CON) 20 MEQ tablet Take 1 tablet (20 mEq total) by mouth daily. 30 tablet 0  . rosuvastatin (CRESTOR) 20 MG tablet Take 20 mg by mouth at bedtime.    .  sacubitril-valsartan (ENTRESTO) 97-103 MG Take 1 tablet by mouth 2 (two) times daily. 60 tablet 2   No current facility-administered medications for this visit.    REVIEW OF SYSTEMS:   Constitutional: Denies fevers, chills or abnormal night sweats Eyes: Denies blurriness of vision, double vision or watery eyes Ears, nose, mouth, throat, and face: Denies mucositis or sore throat Respiratory: Shortness of breath to exertion Cardiovascular: Denies palpitation, chest discomfort or lower extremity swelling Gastrointestinal:  Denies nausea, heartburn or change in bowel habits Skin: Denies abnormal skin rashes Lymphatics: Denies new lymphadenopathy or easy bruising Neurological:Denies numbness, tingling or new weaknesses Behavioral/Psych: Mood is stable, no new changes  All other systems were reviewed with the patient and are negative.  PHYSICAL EXAMINATION: ECOG PERFORMANCE STATUS: 1 - Symptomatic but completely ambulatory  Vitals:   12/15/19 1600  BP: (!) 167/84  Pulse: 69  Resp: 17  Temp: 98.7 F (37.1 C)  SpO2: 99%   Filed Weights   12/15/19 1600  Weight: 235 lb 9.6 oz (106.9 kg)    GENERAL:alert, no distress and comfortable SKIN: skin color, texture, turgor are normal, no rashes or significant lesions EYES: normal, conjunctiva are pink and non-injected, sclera clear OROPHARYNX:no exudate, no erythema and lips, buccal mucosa, and tongue normal  NECK: supple, thyroid normal size, non-tender, without nodularity LYMPH:  no palpable lymphadenopathy in the cervical, axillary or inguinal LUNGS: clear to auscultation and percussion with normal breathing effort HEART: regular rate & rhythm and no murmurs and no lower extremity edema ABDOMEN:abdomen soft, non-tender and normal bowel sounds Musculoskeletal:no cyanosis of digits and no clubbing  PSYCH: alert & oriented x 3 with fluent speech NEURO: no focal motor/sensory deficits  LABORATORY DATA:  I have reviewed the data as  listed Lab Results  Component Value Date   WBC 10.0 04/15/2016   HGB 14.4 04/15/2016   HCT 43.0 04/15/2016   MCV 97.7 04/15/2016   PLT 79 (L) 04/15/2016   Lab Results  Component Value Date   NA 135 04/15/2016   K 4.3 04/15/2016   CL 104 04/15/2016   CO2 24 04/15/2016    RADIOGRAPHIC STUDIES: I have personally reviewed the radiological reports and agreed with the findings in the report.  ASSESSMENT AND PLAN:  Thrombocytopenia ([place]) Chronic thrombocytopenia 12/08/2015: 138 04/15/2016: 79 06/17/19: 49  10/27/19: 76  Most likely cause of thrombocytopenia is cirrhosis of the liver secondary to alcohol abuse. I discussed with the patient extensively about causes of thrombocytopenia. They can be broadly characterized into decreased production or increased consumption. I suspect decreased production of the most likely etiology related to cirrhosis of the liver.   I informed the patient that unless the platelet count stays below 10,000 or if he develops bleeding symptoms, there is no role of routine platelet transfusions.  We discussed the lifespan of platelets being only 7 days and therefore platelet transfusions will not be helpful in the long run.  Recommendation: Watchful monitoring.  Avoiding alcohol usage. Return to clinic on an as-needed basis. Thank you very much for consulting [place].    All questions were answered. The patient knows to call the clinic with any problems, questions or concerns.   Rulon Eisenmenger, MD, MPH 12/15/2019    I, Molly Dorshimer, am acting as scribe for Nicholas Lose, MD.  I have reviewed the above documentation for accuracy and completeness, and I agree with the above.

## 2019-12-15 ENCOUNTER — Other Ambulatory Visit: Payer: Self-pay

## 2019-12-15 ENCOUNTER — Inpatient Hospital Stay: Payer: Medicare HMO | Attending: Hematology and Oncology | Admitting: Hematology and Oncology

## 2019-12-15 DIAGNOSIS — K7460 Unspecified cirrhosis of liver: Secondary | ICD-10-CM | POA: Insufficient documentation

## 2019-12-15 DIAGNOSIS — I509 Heart failure, unspecified: Secondary | ICD-10-CM | POA: Diagnosis not present

## 2019-12-15 DIAGNOSIS — Z79899 Other long term (current) drug therapy: Secondary | ICD-10-CM | POA: Diagnosis not present

## 2019-12-15 DIAGNOSIS — D696 Thrombocytopenia, unspecified: Secondary | ICD-10-CM | POA: Diagnosis not present

## 2019-12-15 DIAGNOSIS — Z7901 Long term (current) use of anticoagulants: Secondary | ICD-10-CM | POA: Diagnosis not present

## 2019-12-15 DIAGNOSIS — I110 Hypertensive heart disease with heart failure: Secondary | ICD-10-CM | POA: Insufficient documentation

## 2019-12-15 DIAGNOSIS — I4821 Permanent atrial fibrillation: Secondary | ICD-10-CM | POA: Insufficient documentation

## 2019-12-15 DIAGNOSIS — F17210 Nicotine dependence, cigarettes, uncomplicated: Secondary | ICD-10-CM | POA: Insufficient documentation

## 2019-12-15 DIAGNOSIS — I739 Peripheral vascular disease, unspecified: Secondary | ICD-10-CM | POA: Diagnosis not present

## 2019-12-15 DIAGNOSIS — I2510 Atherosclerotic heart disease of native coronary artery without angina pectoris: Secondary | ICD-10-CM | POA: Insufficient documentation

## 2019-12-15 NOTE — Assessment & Plan Note (Signed)
Chronic thrombocytopenia 12/08/2015: 138 04/15/2016: 79 06/17/19: 49  10/27/19: 76  Most likely cause of thrombocytopenia is cirrhosis of the liver secondary to alcohol abuse. I discussed with the patient extensively about causes of thrombocytopenia. They can be broadly characterized into decreased production or increased consumption. I suspect decreased production of the most likely etiology related to cirrhosis of the liver.   I informed the patient that unless the platelet count stays below 10,000 or if he develops bleeding symptoms, there is no role of routine platelet transfusions.  We discussed the lifespan of platelets being only 7 days and therefore platelet transfusions will not be helpful in the long run.  Recommendation: Watchful monitoring.  Avoiding alcohol usage. Return to clinic on an as-needed basis. Thank you very much for consulting [place].

## 2020-01-14 ENCOUNTER — Ambulatory Visit (HOSPITAL_COMMUNITY)
Admission: RE | Admit: 2020-01-14 | Discharge: 2020-01-14 | Disposition: A | Discharge status: Home / Self Care | Payer: Medicare HMO | Source: Ambulatory Visit | Attending: Internal Medicine | Admitting: Internal Medicine

## 2020-01-14 ENCOUNTER — Encounter (HOSPITAL_COMMUNITY): Payer: Self-pay | Admitting: Internal Medicine

## 2020-01-14 ENCOUNTER — Other Ambulatory Visit: Payer: Self-pay

## 2020-01-14 VITALS — BP 122/94 | HR 103 | Wt 228.6 lb

## 2020-01-14 DIAGNOSIS — Z720 Tobacco use: Secondary | ICD-10-CM

## 2020-01-14 DIAGNOSIS — Z79899 Other long term (current) drug therapy: Secondary | ICD-10-CM | POA: Diagnosis not present

## 2020-01-14 DIAGNOSIS — I10 Essential (primary) hypertension: Secondary | ICD-10-CM | POA: Diagnosis not present

## 2020-01-14 DIAGNOSIS — Z8673 Personal history of transient ischemic attack (TIA), and cerebral infarction without residual deficits: Secondary | ICD-10-CM | POA: Diagnosis not present

## 2020-01-14 DIAGNOSIS — N183 Chronic kidney disease, stage 3 unspecified: Secondary | ICD-10-CM | POA: Diagnosis not present

## 2020-01-14 DIAGNOSIS — Z8619 Personal history of other infectious and parasitic diseases: Secondary | ICD-10-CM | POA: Diagnosis not present

## 2020-01-14 DIAGNOSIS — F1910 Other psychoactive substance abuse, uncomplicated: Secondary | ICD-10-CM | POA: Diagnosis not present

## 2020-01-14 DIAGNOSIS — I130 Hypertensive heart and chronic kidney disease with heart failure and stage 1 through stage 4 chronic kidney disease, or unspecified chronic kidney disease: Secondary | ICD-10-CM | POA: Insufficient documentation

## 2020-01-14 DIAGNOSIS — I482 Chronic atrial fibrillation, unspecified: Secondary | ICD-10-CM | POA: Insufficient documentation

## 2020-01-14 DIAGNOSIS — Z7901 Long term (current) use of anticoagulants: Secondary | ICD-10-CM | POA: Diagnosis not present

## 2020-01-14 DIAGNOSIS — I2510 Atherosclerotic heart disease of native coronary artery without angina pectoris: Secondary | ICD-10-CM

## 2020-01-14 DIAGNOSIS — I5022 Chronic systolic (congestive) heart failure: Secondary | ICD-10-CM | POA: Diagnosis not present

## 2020-01-14 DIAGNOSIS — I4891 Unspecified atrial fibrillation: Secondary | ICD-10-CM

## 2020-01-14 DIAGNOSIS — F17210 Nicotine dependence, cigarettes, uncomplicated: Secondary | ICD-10-CM | POA: Diagnosis not present

## 2020-01-14 DIAGNOSIS — I739 Peripheral vascular disease, unspecified: Secondary | ICD-10-CM | POA: Insufficient documentation

## 2020-01-14 LAB — CBC
HCT: 48.4 % (ref 39.0–52.0)
Hemoglobin: 16.1 g/dL (ref 13.0–17.0)
MCH: 33.8 pg (ref 26.0–34.0)
MCHC: 33.3 g/dL (ref 30.0–36.0)
MCV: 101.7 fL — ABNORMAL HIGH (ref 80.0–100.0)
Platelets: UNDETERMINED 10*3/uL (ref 150–400)
RBC: 4.76 MIL/uL (ref 4.22–5.81)
RDW: 11.5 % (ref 11.5–15.5)
WBC: 7.5 10*3/uL (ref 4.0–10.5)
nRBC: 0 % (ref 0.0–0.2)

## 2020-01-14 LAB — BASIC METABOLIC PANEL
Anion gap: 9 (ref 5–15)
BUN: 20 mg/dL (ref 8–23)
CO2: 26 mmol/L (ref 22–32)
Calcium: 9 mg/dL (ref 8.9–10.3)
Chloride: 105 mmol/L (ref 98–111)
Creatinine, Ser: 1.97 mg/dL — ABNORMAL HIGH (ref 0.61–1.24)
GFR calc Af Amer: 39 mL/min — ABNORMAL LOW (ref >60)
GFR calc non Af Amer: 34 mL/min — ABNORMAL LOW (ref >60)
Glucose, Bld: 135 mg/dL — ABNORMAL HIGH (ref 70–99)
Potassium: 4.6 mmol/L (ref 3.5–5.1)
Sodium: 140 mmol/L (ref 135–145)

## 2020-01-14 LAB — BRAIN NATRIURETIC PEPTIDE: B Natriuretic Peptide: 857.3 pg/mL — ABNORMAL HIGH (ref 0.0–100.0)

## 2020-01-14 MED ORDER — SPIRONOLACTONE 25 MG PO TABS: 12.5000 mg | ORAL_TABLET | Freq: Every day | ORAL | 3 refills | Status: DC

## 2020-01-14 NOTE — Patient Instructions (Signed)
STOP Potassium  START Spironolactone 12.5mg  (1/2 tab) daily  Labs today and repeat in 1 week We will only contact you if something comes back abnormal or we need to make some changes. Otherwise no news is good news!  Your physician has requested that you have an echocardiogram next week. Echocardiography is a painless test that uses sound waves to create images of your heart. It provides your doctor with information about the size and shape of your heart and how well your heart's chambers and valves are working. This procedure takes approximately one hour. There are no restrictions for this procedure.  Your provider has recommended that  you wear a Zio Patch for 7 days.  This monitor will record your heart rhythm for our review.  IF you have any symptoms while wearing the monitor please press the button.  If you have any issues with the patch or you notice a red or orange light on it please call the company at [phone].  Once you remove the patch please mail it back to the company as soon as possible so we can get the results.   Your physician recommends that you schedule a follow-up appointment in: 2 months with Dr Gala Romney   Please call office at [phone] option 2 if you have any questions or concerns.   At the [facility], you and your health needs are our priority. As part of our continuing mission to provide you with exceptional heart care, we have created designated Provider Care Teams. These Care Teams include your primary Cardiologist (physician) and Advanced Practice Providers (APPs- Physician Assistants and Nurse Practitioners) who all work together to provide you with the care you need, when you need it.   You may see any of the following providers on your designated Care Team at your next follow up: Marland Kitchen Dr Arvilla Meres . Dr Marca Ancona . Tonye Becket, NP . Robbie Lis, PA . Karle Plumber, PharmD   Please be sure to bring in all your  medications bottles to every appointment.

## 2020-01-14 NOTE — Progress Notes (Addendum)
Zio patch placed onto patient.  All instructions and information reviewed with patient, they verbalize understanding with no questions.  Medication Samples have been provided to the patient.  Drug name: Eliquis       Strength: 5mg         Qty: 28  LOT:  Exp.Date: 10/2021  Dosing instructions: 1 tab twice a day  The patient has been instructed regarding the correct time, dose, and frequency of taking this medication, including desired effects and most common side effects.   11/2021 3:35 PM 01/14/2020

## 2020-01-14 NOTE — Progress Notes (Signed)
Advanced [facility] Failure [facility] Note  Date:  01/14/2020   ID:  Kevin [name], DOB Sep 22, 1951, MRN [id number]  Location: Home  Provider location: [facility] Type of Visit: Established patient  PCP:  Trey Sailors, PA  Cardiologist:  No primary care provider on file. Primary HF: Skylene Deremer  Chief Complaint: Heart Failure follow-up   History of Present Illness:  Kevin [name] is a 69 year old male with a past medical history of PAD, CAD, severe HTN, polysubstance abuse (cocaine and ETOH) January 2017, persistent Afib (on Eliquis), TIA and Hepatitis C.   We have not seen him since 11/18. He presents today for telehealth visit.   He had an intermediate risk Lexiscan Myoview in December 2015, that showed moderate area of anterolateral/lateral wall ischemia. He went for a left heart cath a month later in January 2016 (report below), and he had 90% tubular mid-ramus stenosis and 70% bifurcation PDA/PLB stenosis. Treated medically.  He presented to the ED via EMS on 01/31/16 after he woke up vomiting and incontinence. [name] noted that he was non verbal, she called EMS. When he arrived to the ED he began seizing. Code stroke was called. CT of head showed equivocal subacute right occipital infarction. No hemorrhage. Had elevated troponin of 0.75 initially, it has increased to 17.54. Cardiology was consulted. EKG when patient was admitted showed Afib RVR with rate of 170. Echo EF 35-40% with LVH (I looked at echo and feel EF more like 45-50% with AF with RVR). No cath performed.    Here for f/u. We have not seen him in 1 year. He missed his f/u echo. Says he is doing pretty good but remains SOB with mild activity. Has PFTs next week. No CP. Legs feel weak. No edema. Occasional orthopnea or PND. Takes lasix 40 daily. Never takes extra. On Eliquis. No bleeding. Smoking 3-4 cigs/day  Echo 6/17: EF 35-40%  CARDIAC CATHETERIZATION 09/27/14   Left  Main: Long vessel, angiographically normal, trifurcates into LAD, LCX and Ramus branches  Left Anterior Descending (LAD): There is a proximal 20-30% eccentric, hazy plaque and mild mid-LAD stenosis, vessel reaches the apex  1st diagonal (D1): Smaller branch, mild diffuse disease  Circumflex (LCx): AV groove vessel, gives off 2 OM branches, mild ostial stenosis.  1st obtuse marginal: Smaller vessel, no stenosis.  2nd obtuse marginal: Minimal luminal irregularities  Ramus Intermedius: There is a tubular 90% mid-Ramus stenosis, the vessel coarses to the lateral wall  Right Coronary Artery: Dominant artery. Mild luminal irregularities.  right ventricle branch of right coronary artery: No stenosis.  posterior descending artery: There is a 70% ostial bifurcation stenosis, however, this is <2.0 mm in diameter  posterior lateral branch: Ostial stenosis (see above)   Kevin [name] denies symptoms worrisome for COVID 19.   Past Medical History:  Diagnosis Date  . Atrial fibrillation, permanent ([place]) 02/01/2016  . Blood transfusion without reported diagnosis   . CAD in native artery cath 2016 90% mRamus  02/01/2016  . CHF (congestive heart failure) ([place])   . Cholelithiases 05/01/2012  . Claudication ([place])    bilateral  . Cocaine abuse ([place])   . Dysrhythmia    afib  . ETOH abuse   . Hepatitis C antibody test positive 04/2012  . Hyperpigmentation   . Hypertension   . PAD (peripheral artery disease) ([place])   . Persistent atrial fibrillation ([place]) 08/23/2014  . Thrombocytopenia ([place])   . Tobacco abuse  Past Surgical History:  Procedure Laterality Date  . Cardiolite     negative for ischemia  . CARDIOVASCULAR STRESS TEST    . CARDIOVERSION  05/05/2012   Procedure: CARDIOVERSION;  Surgeon: Thurmon Fair, MD;  Location: [facility]OS[facility];  Service: Cardiovascular;  Laterality: N/A;  . CAROTID ARTERY ANGIOPLASTY    . DOPPLER ECHOCARDIOGRAPHY    . ENDOVASCULAR STENT INSERTION   right leg  . LEFT HEART CATHETERIZATION WITH CORONARY ANGIOGRAM N/A 05/01/2012   Procedure: LEFT HEART CATHETERIZATION WITH CORONARY ANGIOGRAM;  Surgeon: Marykay Lex, MD;  Location: [facility] CATH LAB;  Service: Cardiovascular;  Laterality: N/A;  . LEFT HEART CATHETERIZATION WITH CORONARY ANGIOGRAM N/A 09/27/2014   Procedure: LEFT HEART CATHETERIZATION WITH CORONARY ANGIOGRAM;  Surgeon: Chrystie Nose, MD;  Location: [facility] CATH LAB;  Service: Cardiovascular;  Laterality: N/A;  . TEE WITHOUT CARDIOVERSION  05/05/2012   Procedure: TRANSESOPHAGEAL ECHOCARDIOGRAM (TEE);  Surgeon: Thurmon Fair, MD;  Location: [facility] ENDOSCOPY;  Service: Cardiovascular;  Laterality: N/A;     Current Outpatient Medications  Medication Sig Dispense Refill  . apixaban (ELIQUIS) 5 MG TABS tablet Take 1 tablet (5 mg total) by mouth 2 (two) times daily. 180 tablet 3  . buPROPion (WELLBUTRIN SR) 150 MG 12 hr tablet TAKE 1 TABLET BY MOUTH TWICE A DAY 60 tablet 0  . carvedilol (COREG) 25 MG tablet TAKE 1 TABLET BY MOUTH 2 TIMES DAILY WITH A MEAL. 60 tablet 2  . folic acid (FOLVITE) 1 MG tablet Take 1 tablet (1 mg total) by mouth daily. Need appointment before anymore refills 30 tablet 1  . furosemide (LASIX) 40 MG tablet Take 1 tablet (40 mg total) by mouth daily. 30 tablet 0  . levETIRAcetam (KEPPRA) 1000 MG tablet Take 1 tablet (1,000 mg total) by mouth 2 (two) times daily. 180 tablet 2  . Multiple Vitamin (MULTIVITAMIN WITH MINERALS) TABS tablet Take 1 tablet by mouth daily. Men's One a Day    . pantoprazole (PROTONIX) 40 MG tablet Take 1 tablet (40 mg total) by mouth at bedtime. 90 tablet 3  . potassium chloride SA (K-DUR,KLOR-CON) 20 MEQ tablet Take 1 tablet (20 mEq total) by mouth daily. 30 tablet 0  . rosuvastatin (CRESTOR) 20 MG tablet Take 20 mg by mouth at bedtime.    . sacubitril-valsartan (ENTRESTO) 97-103 MG Take 1 tablet by mouth 2 (two) times daily. 60 tablet 2   No current facility-administered medications for this  encounter.    Allergies:   Patient has no known allergies.   Social History:  The patient  reports that he has been smoking cigarettes. He has a 10.00 pack-year smoking history. He has never used smokeless tobacco. He reports current alcohol use. He reports that he does not use drugs.   Family History:  The patient's family history includes Cancer in his mother; Seizures in his daughter.   ROS:  Please see the history of present illness.   All other systems are personally reviewed and negative.   Vitals:   01/14/20 1443  BP: (!) 122/94  Pulse: (!) 103  SpO2: 97%  Weight: 103.7 kg (228 lb 9.6 oz)    Exam:   General:  Well appearing. No resp difficulty HEENT: normal Neck: supple. no JVD. Carotids 2+ bilat; no bruits. No lymphadenopathy or thryomegaly appreciated. Cor: PMI nondisplaced. Irregular rate & rhythm. No rubs, gallops or murmurs. Lungs: clear Abdomen: soft, nontender, nondistended. No hepatosplenomegaly. No bruits or masses. Good bowel sounds. Extremities: no cyanosis, clubbing, rash, edema Neuro: alert &  orientedx3, cranial nerves grossly intact. moves all 4 extremities w/o difficulty. Affect pleasant   Recent Labs: No results found for requested labs within last 8760 hours.  Personally reviewed   Wt Readings from Last 3 Encounters:  01/14/20 103.7 kg (228 lb 9.6 oz)  12/15/19 106.9 kg (235 lb 9.6 oz)  07/29/17 104.8 kg (231 lb)    ECG: AF 106 RBBB Personally reviewed  ASSESSMENT AND PLAN:  1. Chronic systolic HF    - EF 35-40% by echo 6/17   - Stable NYHA III. Volume status stable.   - Continue current regimen with Entresto, carvedilol and lasix    - Stop Kcl. Start spiro 12.5    - Repeat echo   - consider SGLT2i in near future 2. Chronic AF   - Rate slightly elevated.   - Place Zio to assess overall rate control   - Continue Eliquis. No bleeding 3. CAD     - Stable. No s/s ischemia    - Continue statin. No ASA with Eliquis  4. Severe HTN    - Blood  pressure with improved control but DBP still high. Adding spiro. 5. Polysubstance abuse     - reports cessation 6. Tobacco use     - back smoking a few cigs per day. Encouraged cessation.  7. CKD stage III    - check labs today 8. Lower extremity claudication     - Follows with Dr. Kirke Corin for PAD. Legs weak but no reported claudication    Arvilla Meres, MD  01/14/2020 2:52 PM  Advanced [facility] [address] [facility] [place] [address] [phone] (office) [phone] (fax)

## 2020-01-17 ENCOUNTER — Encounter: Payer: Self-pay | Admitting: Pulmonary Disease

## 2020-01-17 ENCOUNTER — Ambulatory Visit (INDEPENDENT_AMBULATORY_CARE_PROVIDER_SITE_OTHER): Payer: Medicare HMO | Admitting: Pulmonary Disease

## 2020-01-17 ENCOUNTER — Other Ambulatory Visit: Payer: Self-pay

## 2020-01-17 VITALS — BP 132/78 | HR 101 | Temp 97.7°F | Ht 73.0 in | Wt 227.8 lb

## 2020-01-17 DIAGNOSIS — R0602 Shortness of breath: Secondary | ICD-10-CM | POA: Diagnosis not present

## 2020-01-17 DIAGNOSIS — G4733 Obstructive sleep apnea (adult) (pediatric): Secondary | ICD-10-CM | POA: Diagnosis not present

## 2020-01-17 MED ORDER — ALBUTEROL SULFATE HFA 108 (90 BASE) MCG/ACT IN AERS: 2.0000 | INHALATION_SPRAY | Freq: Four times a day (QID) | RESPIRATORY_TRACT | 5 refills | Status: AC | PRN

## 2020-01-17 NOTE — Progress Notes (Signed)
Kevin [name]    [id number]    10-Jul-1951  Primary Care Physician:Vanstory, Suzan Slick, PA  Referring Physician: Norm Salt, PA [address] [place],  [place] [address]  Chief complaint:   Patient with a history of shortness of breath  HPI:  Shortness of breath of a few years duration  Typically goes to bed between 9 and 10 PM Falls asleep in about 15 minutes 1-2 awakenings Final wake up time about 10 AM  Denies snoring Denies witnessed apneas Denies significant daytime sleepiness Is tired a lot  History of strokes History of atrial fibrillation History of congestive heart failure  An active smoker, less than half a pack a day currently  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 apixaban (ELIQUIS) 5 MG TABS tablet Take 1 tablet (5 mg total) by mouth 2 (two) times daily.  Marland Kitchen buPROPion (WELLBUTRIN SR) 150 MG 12 hr tablet TAKE 1 TABLET BY MOUTH TWICE A DAY  . carvedilol (COREG) 25 MG tablet TAKE 1 TABLET BY MOUTH 2 TIMES DAILY WITH A MEAL.  . folic acid (FOLVITE) 1 MG tablet Take 1 tablet (1 mg total) by mouth daily. Need appointment before anymore refills  . furosemide (LASIX) 40 MG tablet Take 1 tablet (40 mg total) by mouth daily.  Marland Kitchen levETIRAcetam (KEPPRA) 1000 MG tablet Take 1 tablet (1,000 mg total) by mouth 2 (two) times daily.  . Multiple Vitamin (MULTIVITAMIN WITH MINERALS) TABS tablet Take 1 tablet by mouth daily. Men's One a Day  . pantoprazole (PROTONIX) 40 MG tablet Take 1 tablet (40 mg total) by mouth at bedtime.  . rosuvastatin (CRESTOR) 20 MG tablet Take 20 mg by mouth at bedtime.  . sacubitril-valsartan (ENTRESTO) 97-103 MG Take 1 tablet by mouth 2 (two) times daily.  Marland Kitchen spironolactone  (ALDACTONE) 25 MG tablet Take 0.5 tablets (12.5 mg total) by mouth daily.  Marland Kitchen albuterol (VENTOLIN HFA) 108 (90 Base) MCG/ACT inhaler Inhale 2 puffs into the lungs every 6 (six) hours as needed for wheezing or shortness of breath.   No facility-administered encounter medications on file as of 01/17/2020.    Allergies as of 01/17/2020  . (No Known Allergies)    Past Medical History:  Diagnosis Date  . Atrial fibrillation, permanent ([facility]) 02/01/2016  . Blood transfusion without reported diagnosis   . CAD in native artery cath 2016 90% mRamus  02/01/2016  . CHF (congestive heart failure) ([facility])   . Cholelithiases 05/01/2012  . Claudication ([facility])    bilateral  . Cocaine abuse ([facility])   . Dysrhythmia    afib  . ETOH abuse   . Hepatitis C antibody test positive 04/2012  . Hyperpigmentation   . Hypertension   . PAD (peripheral artery disease) ([facility])   . Persistent atrial fibrillation ([facility]) 08/23/2014  . Thrombocytopenia ([facility])   . Tobacco abuse     Past Surgical History:  Procedure Laterality Date  . Cardiolite     negative for ischemia  . CARDIOVASCULAR STRESS TEST    . CARDIOVERSION  05/05/2012   Procedure: CARDIOVERSION;  Surgeon: Thurmon Fair, MD;  Location: [facility] ENDOSCOPY;  Service: Cardiovascular;  Laterality: N/A;  . CAROTID ARTERY ANGIOPLASTY    .  DOPPLER ECHOCARDIOGRAPHY    . ENDOVASCULAR STENT INSERTION  right leg  . LEFT HEART CATHETERIZATION WITH CORONARY ANGIOGRAM N/A 05/01/2012   Procedure: LEFT HEART CATHETERIZATION WITH CORONARY ANGIOGRAM;  Surgeon: Leonie Man, MD;  Location: [facility] CATH LAB;  Service: Cardiovascular;  Laterality: N/A;  . LEFT HEART CATHETERIZATION WITH CORONARY ANGIOGRAM N/A 09/27/2014   Procedure: LEFT HEART CATHETERIZATION WITH CORONARY ANGIOGRAM;  Surgeon: Pixie Casino, MD;  Location: [facility] CATH LAB;  Service: Cardiovascular;  Laterality: N/A;  . TEE WITHOUT CARDIOVERSION  05/05/2012   Procedure: TRANSESOPHAGEAL ECHOCARDIOGRAM (TEE);  Surgeon: Sanda Klein, MD;  Location: [facility] ENDOSCOPY;  Service: Cardiovascular;  Laterality: N/A;    Family History  Problem Relation Age of Onset  . Seizures Daughter   . Cancer Mother     Social History   Socioeconomic History  . Marital status: Married    Spouse name: Cleone Slim  . Number of children: 5  . Years of education: 61  . Highest education level: Not on file  Occupational History  . Occupation: Retired    Fish farm manager: [employer]  Tobacco Use  . Smoking status: Current Every Day Smoker    Packs/day: 0.25    Years: 40.00    Pack years: 10.00    Types: Cigarettes  . Smokeless tobacco: Never Used  Substance and Sexual Activity  . Alcohol use: Yes    Alcohol/week: 0.0 - 16.0 standard drinks    Comment: He used to drink up to 1/2 pint daily; 6 beers. Quit in March 2015  . Drug use: No    Comment: cocaine, marijuana  . Sexual activity: Yes    Partners: Female  Other Topics Concern  . Not on file  Social History Narrative   Lives with his wife.  Was raised by a non-related family from the age of 65 years, and so knows very little about his biological family history.   Social Determinants of Health   Financial Resource Strain:   . Difficulty of Paying Living Expenses:   Food Insecurity:   . Worried About Charity fundraiser in the Last Year:   . Arboriculturist in the Last Year:   Transportation Needs:   . Film/video editor (Medical):   Marland Kitchen Lack of Transportation (Non-Medical):   Physical Activity:   . Days of Exercise per Week:   . Minutes of Exercise per Session:   Stress:   . Feeling of Stress :   Social Connections:   . Frequency of Communication with Friends and Family:   . Frequency of Social Gatherings with Friends and Family:   . Attends Religious Services:   . Active Member of Clubs or Organizations:   . Attends Archivist Meetings:   Marland Kitchen Marital Status:   Intimate Partner Violence:   . Fear of Current or Ex-Partner:   . Emotionally Abused:   Marland Kitchen  Physically Abused:   . Sexually Abused:     Review of Systems  Constitutional: Positive for fatigue.  Respiratory: Positive for shortness of breath. Negative for cough and wheezing.   Genitourinary: Negative.   Psychiatric/Behavioral: Positive for sleep disturbance.    Vitals:   01/17/20 1437  BP: 132/78  Pulse: (!) 101  Temp: 97.7 F (36.5 C)  SpO2: 100%     Physical Exam  Constitutional: He is oriented to person, place, and time. He appears well-developed.  Overweight  HENT:  Head: Normocephalic and atraumatic.  Mallampati 4, crowded oropharynx  Eyes: Pupils are equal,  round, and reactive to light. Conjunctivae and EOM are normal.  Neck: No tracheal deviation present. No thyromegaly present.  Cardiovascular: Normal rate and regular rhythm.  Pulmonary/Chest: Effort normal. No respiratory distress. He has no wheezes. He has no rales. He exhibits no tenderness.  Musculoskeletal:        General: No edema. Normal range of motion.     Cervical back: Normal range of motion and neck supple.  Neurological: He is alert and oriented to person, place, and time.  Skin: Skin is warm and dry. No erythema.  Psychiatric: He has a normal mood and affect.   Results of the Epworth flowsheet 01/17/2020  Sitting and reading 2  Watching TV 2  Sitting, inactive in a public place (e.g. a theatre or a meeting) 0  As a passenger in a car for an hour without a break 0  Lying down to rest in the afternoon when circumstances permit 1  Sitting and talking to someone 0  Sitting quietly after a lunch without alcohol 0  In a car, while stopped for a few minutes in traffic 0  Total score 5   Data Reviewed: Echocardiogram from 2017 did reveal it reduced ejection fraction Recent BNP in the 800s No PFT on record  Assessment:  Shortness of breath -Multifactorial -Likely underlying obstructive lung disease -Systolic heart failure  Currently uses Trelegy  He has atrial fibrillation  Possible  obstructive sleep apnea although denies significant symptoms at present -Will benefit from having a home sleep study performed  Pathophysiology of sleep apnea discussed Effective sleep apnea on his other chronic problems discussed  Plan/Recommendations: Continue Trelegy Obtain PFT if we cannot get a copy of recent one from PCPs office Prescription for rescue inhaler Smoking cessation counseling Order home sleep study  Counseled about the need for graded exercises  I will see him back in the office in about 2 to 3 months  Encouraged to call with any significant concerns   Virl Diamond MD [facility] 01/17/2020, 3:25 PM  CC: Norm Salt, PA

## 2020-01-17 NOTE — Patient Instructions (Signed)
Shortness of breath -Likely related to multiple factors -COPD -Previous strokes -Heart disease  We will try and get a copy of your recent breathing study If we do not receive one, we will order a breathing study on you -Continue using Trelegy -We will prescribe a rescue inhaler to be used as needed  Obstructive sleep apnea -We will get a sleep study -Treating this will help energy levels, sleep quality, reduce risk of continuing heart problems  Continue to work on quitting smoking  Regular exercises  I will see you in 2-3 months  Call with significant concerns

## 2020-01-20 ENCOUNTER — Ambulatory Visit (HOSPITAL_COMMUNITY): Admission: RE | Admit: 2020-01-20 | Payer: Medicare HMO | Source: Ambulatory Visit

## 2020-01-20 ENCOUNTER — Other Ambulatory Visit (HOSPITAL_COMMUNITY): Payer: Medicare HMO

## 2020-01-31 DIAGNOSIS — I482 Chronic atrial fibrillation, unspecified: Secondary | ICD-10-CM | POA: Diagnosis not present

## 2020-02-07 NOTE — Addendum Note (Signed)
Encounter addended by: Crissie Figures, RN on: 02/07/2020 1:53 PM  Actions taken: Imaging Exam ended

## 2020-02-24 ENCOUNTER — Ambulatory Visit: Payer: Medicare HMO

## 2020-02-24 ENCOUNTER — Other Ambulatory Visit: Payer: Self-pay

## 2020-02-24 DIAGNOSIS — G4733 Obstructive sleep apnea (adult) (pediatric): Secondary | ICD-10-CM | POA: Diagnosis not present

## 2020-03-02 ENCOUNTER — Emergency Department (HOSPITAL_COMMUNITY): Payer: Medicare HMO

## 2020-03-02 ENCOUNTER — Inpatient Hospital Stay (HOSPITAL_COMMUNITY)
Admission: EM | Admit: 2020-03-02 | Discharge: 2020-03-06 | DRG: 065 | Disposition: A | Discharge status: Rehabilitation Facility | Payer: Medicare HMO | Attending: Internal Medicine | Admitting: Internal Medicine

## 2020-03-02 ENCOUNTER — Inpatient Hospital Stay (HOSPITAL_COMMUNITY): Payer: Medicare HMO

## 2020-03-02 ENCOUNTER — Encounter (HOSPITAL_COMMUNITY): Payer: Self-pay | Admitting: Emergency Medicine

## 2020-03-02 DIAGNOSIS — R030 Elevated blood-pressure reading, without diagnosis of hypertension: Secondary | ICD-10-CM | POA: Diagnosis not present

## 2020-03-02 DIAGNOSIS — I10 Essential (primary) hypertension: Secondary | ICD-10-CM | POA: Diagnosis not present

## 2020-03-02 DIAGNOSIS — E785 Hyperlipidemia, unspecified: Secondary | ICD-10-CM | POA: Diagnosis present

## 2020-03-02 DIAGNOSIS — D62 Acute posthemorrhagic anemia: Secondary | ICD-10-CM | POA: Diagnosis not present

## 2020-03-02 DIAGNOSIS — K219 Gastro-esophageal reflux disease without esophagitis: Secondary | ICD-10-CM | POA: Diagnosis present

## 2020-03-02 DIAGNOSIS — K5900 Constipation, unspecified: Secondary | ICD-10-CM | POA: Diagnosis not present

## 2020-03-02 DIAGNOSIS — I5022 Chronic systolic (congestive) heart failure: Secondary | ICD-10-CM | POA: Diagnosis present

## 2020-03-02 DIAGNOSIS — R29818 Other symptoms and signs involving the nervous system: Secondary | ICD-10-CM | POA: Diagnosis present

## 2020-03-02 DIAGNOSIS — F1910 Other psychoactive substance abuse, uncomplicated: Secondary | ICD-10-CM | POA: Diagnosis not present

## 2020-03-02 DIAGNOSIS — I639 Cerebral infarction, unspecified: Secondary | ICD-10-CM | POA: Diagnosis not present

## 2020-03-02 DIAGNOSIS — R29810 Facial weakness: Secondary | ICD-10-CM | POA: Diagnosis present

## 2020-03-02 DIAGNOSIS — Z79899 Other long term (current) drug therapy: Secondary | ICD-10-CM

## 2020-03-02 DIAGNOSIS — R531 Weakness: Secondary | ICD-10-CM | POA: Diagnosis not present

## 2020-03-02 DIAGNOSIS — R471 Dysarthria and anarthria: Secondary | ICD-10-CM | POA: Diagnosis present

## 2020-03-02 DIAGNOSIS — N1831 Chronic kidney disease, stage 3a: Secondary | ICD-10-CM | POA: Diagnosis not present

## 2020-03-02 DIAGNOSIS — F17210 Nicotine dependence, cigarettes, uncomplicated: Secondary | ICD-10-CM | POA: Diagnosis not present

## 2020-03-02 DIAGNOSIS — D7589 Other specified diseases of blood and blood-forming organs: Secondary | ICD-10-CM | POA: Diagnosis not present

## 2020-03-02 DIAGNOSIS — N183 Chronic kidney disease, stage 3 unspecified: Secondary | ICD-10-CM | POA: Diagnosis present

## 2020-03-02 DIAGNOSIS — B1920 Unspecified viral hepatitis C without hepatic coma: Secondary | ICD-10-CM | POA: Diagnosis present

## 2020-03-02 DIAGNOSIS — R29706 NIHSS score 6: Secondary | ICD-10-CM | POA: Diagnosis present

## 2020-03-02 DIAGNOSIS — I69351 Hemiplegia and hemiparesis following cerebral infarction affecting right dominant side: Secondary | ICD-10-CM | POA: Diagnosis not present

## 2020-03-02 DIAGNOSIS — R4781 Slurred speech: Secondary | ICD-10-CM | POA: Diagnosis not present

## 2020-03-02 DIAGNOSIS — I6389 Other cerebral infarction: Secondary | ICD-10-CM

## 2020-03-02 DIAGNOSIS — G40909 Epilepsy, unspecified, not intractable, without status epilepticus: Secondary | ICD-10-CM | POA: Diagnosis present

## 2020-03-02 DIAGNOSIS — K7460 Unspecified cirrhosis of liver: Secondary | ICD-10-CM | POA: Diagnosis present

## 2020-03-02 DIAGNOSIS — D696 Thrombocytopenia, unspecified: Secondary | ICD-10-CM | POA: Diagnosis present

## 2020-03-02 DIAGNOSIS — R410 Disorientation, unspecified: Secondary | ICD-10-CM | POA: Diagnosis not present

## 2020-03-02 DIAGNOSIS — R000 Tachycardia, unspecified: Secondary | ICD-10-CM | POA: Diagnosis not present

## 2020-03-02 DIAGNOSIS — E872 Acidosis: Secondary | ICD-10-CM | POA: Diagnosis not present

## 2020-03-02 DIAGNOSIS — R4701 Aphasia: Secondary | ICD-10-CM | POA: Diagnosis present

## 2020-03-02 DIAGNOSIS — I130 Hypertensive heart and chronic kidney disease with heart failure and stage 1 through stage 4 chronic kidney disease, or unspecified chronic kidney disease: Secondary | ICD-10-CM | POA: Diagnosis not present

## 2020-03-02 DIAGNOSIS — K5901 Slow transit constipation: Secondary | ICD-10-CM | POA: Diagnosis not present

## 2020-03-02 DIAGNOSIS — Z20822 Contact with and (suspected) exposure to covid-19: Secondary | ICD-10-CM | POA: Diagnosis present

## 2020-03-02 DIAGNOSIS — Z7901 Long term (current) use of anticoagulants: Secondary | ICD-10-CM | POA: Diagnosis not present

## 2020-03-02 DIAGNOSIS — I4821 Permanent atrial fibrillation: Secondary | ICD-10-CM | POA: Diagnosis not present

## 2020-03-02 DIAGNOSIS — G459 Transient cerebral ischemic attack, unspecified: Secondary | ICD-10-CM

## 2020-03-02 DIAGNOSIS — R404 Transient alteration of awareness: Secondary | ICD-10-CM | POA: Diagnosis not present

## 2020-03-02 DIAGNOSIS — R569 Unspecified convulsions: Secondary | ICD-10-CM | POA: Diagnosis not present

## 2020-03-02 DIAGNOSIS — F1210 Cannabis abuse, uncomplicated: Secondary | ICD-10-CM | POA: Diagnosis not present

## 2020-03-02 DIAGNOSIS — I739 Peripheral vascular disease, unspecified: Secondary | ICD-10-CM | POA: Diagnosis present

## 2020-03-02 DIAGNOSIS — J449 Chronic obstructive pulmonary disease, unspecified: Secondary | ICD-10-CM | POA: Diagnosis present

## 2020-03-02 DIAGNOSIS — R739 Hyperglycemia, unspecified: Secondary | ICD-10-CM | POA: Diagnosis not present

## 2020-03-02 DIAGNOSIS — R7309 Other abnormal glucose: Secondary | ICD-10-CM | POA: Diagnosis not present

## 2020-03-02 DIAGNOSIS — I63233 Cerebral infarction due to unspecified occlusion or stenosis of bilateral carotid arteries: Secondary | ICD-10-CM | POA: Diagnosis not present

## 2020-03-02 DIAGNOSIS — N1832 Chronic kidney disease, stage 3b: Secondary | ICD-10-CM | POA: Diagnosis not present

## 2020-03-02 DIAGNOSIS — I6340 Cerebral infarction due to embolism of unspecified cerebral artery: Secondary | ICD-10-CM | POA: Diagnosis not present

## 2020-03-02 DIAGNOSIS — G8191 Hemiplegia, unspecified affecting right dominant side: Secondary | ICD-10-CM | POA: Diagnosis not present

## 2020-03-02 DIAGNOSIS — I252 Old myocardial infarction: Secondary | ICD-10-CM | POA: Diagnosis not present

## 2020-03-02 DIAGNOSIS — G4733 Obstructive sleep apnea (adult) (pediatric): Secondary | ICD-10-CM | POA: Diagnosis not present

## 2020-03-02 DIAGNOSIS — I2510 Atherosclerotic heart disease of native coronary artery without angina pectoris: Secondary | ICD-10-CM | POA: Diagnosis not present

## 2020-03-02 DIAGNOSIS — R339 Retention of urine, unspecified: Secondary | ICD-10-CM | POA: Diagnosis not present

## 2020-03-02 DIAGNOSIS — N319 Neuromuscular dysfunction of bladder, unspecified: Secondary | ICD-10-CM | POA: Diagnosis not present

## 2020-03-02 DIAGNOSIS — R0989 Other specified symptoms and signs involving the circulatory and respiratory systems: Secondary | ICD-10-CM | POA: Diagnosis not present

## 2020-03-02 DIAGNOSIS — I4819 Other persistent atrial fibrillation: Secondary | ICD-10-CM | POA: Diagnosis not present

## 2020-03-02 LAB — DIFFERENTIAL
Abs Immature Granulocytes: 0.02 K/uL (ref 0.00–0.07)
Basophils Absolute: 0 K/uL (ref 0.0–0.1)
Basophils Relative: 0 %
Eosinophils Absolute: 0.1 K/uL (ref 0.0–0.5)
Eosinophils Relative: 2 %
Immature Granulocytes: 0 %
Lymphocytes Relative: 5 %
Lymphs Abs: 0.4 K/uL — ABNORMAL LOW (ref 0.7–4.0)
Monocytes Absolute: 1 K/uL (ref 0.1–1.0)
Monocytes Relative: 12 %
Neutro Abs: 7.1 K/uL (ref 1.7–7.7)
Neutrophils Relative %: 81 %

## 2020-03-02 LAB — LIPID PANEL
Cholesterol: 138 mg/dL (ref 0–200)
HDL: 42 mg/dL (ref >40)
LDL Cholesterol: 78 mg/dL (ref 0–99)
Total CHOL/HDL Ratio: 3.3 RATIO
Triglycerides: 89 mg/dL (ref <150)
VLDL: 18 mg/dL (ref 0–40)

## 2020-03-02 LAB — SARS CORONAVIRUS 2 BY RT PCR (HOSPITAL ORDER, PERFORMED IN ~~LOC~~ HOSPITAL LAB): SARS Coronavirus 2: NEGATIVE

## 2020-03-02 LAB — I-STAT CHEM 8, ED
BUN: 26 mg/dL — ABNORMAL HIGH (ref 8–23)
Calcium, Ion: 1.13 mmol/L — ABNORMAL LOW (ref 1.15–1.40)
Chloride: 109 mmol/L (ref 98–111)
Creatinine, Ser: 2 mg/dL — ABNORMAL HIGH (ref 0.61–1.24)
Glucose, Bld: 197 mg/dL — ABNORMAL HIGH (ref 70–99)
HCT: 44 % (ref 39.0–52.0)
Hemoglobin: 15 g/dL (ref 13.0–17.0)
Potassium: 4.2 mmol/L (ref 3.5–5.1)
Sodium: 139 mmol/L (ref 135–145)
TCO2: 17 mmol/L — ABNORMAL LOW (ref 22–32)

## 2020-03-02 LAB — HIV ANTIBODY (ROUTINE TESTING W REFLEX): HIV Screen 4th Generation wRfx: NONREACTIVE

## 2020-03-02 LAB — COMPREHENSIVE METABOLIC PANEL WITH GFR
ALT: 26 U/L (ref 0–44)
AST: 28 U/L (ref 15–41)
Albumin: 3.4 g/dL — ABNORMAL LOW (ref 3.5–5.0)
Alkaline Phosphatase: 78 U/L (ref 38–126)
Anion gap: 12 (ref 5–15)
BUN: 27 mg/dL — ABNORMAL HIGH (ref 8–23)
CO2: 17 mmol/L — ABNORMAL LOW (ref 22–32)
Calcium: 8.6 mg/dL — ABNORMAL LOW (ref 8.9–10.3)
Chloride: 107 mmol/L (ref 98–111)
Creatinine, Ser: 1.95 mg/dL — ABNORMAL HIGH (ref 0.61–1.24)
GFR calc Af Amer: 40 mL/min — ABNORMAL LOW
GFR calc non Af Amer: 34 mL/min — ABNORMAL LOW
Glucose, Bld: 197 mg/dL — ABNORMAL HIGH (ref 70–99)
Potassium: 4 mmol/L (ref 3.5–5.1)
Sodium: 136 mmol/L (ref 135–145)
Total Bilirubin: 1.2 mg/dL (ref 0.3–1.2)
Total Protein: 6.6 g/dL (ref 6.5–8.1)

## 2020-03-02 LAB — CBC
HCT: 45.6 % (ref 39.0–52.0)
Hemoglobin: 15.1 g/dL (ref 13.0–17.0)
MCH: 34 pg (ref 26.0–34.0)
MCHC: 33.1 g/dL (ref 30.0–36.0)
MCV: 102.7 fL — ABNORMAL HIGH (ref 80.0–100.0)
Platelets: 53 K/uL — ABNORMAL LOW (ref 150–400)
RBC: 4.44 MIL/uL (ref 4.22–5.81)
RDW: 11.8 % (ref 11.5–15.5)
WBC: 8.7 K/uL (ref 4.0–10.5)
nRBC: 0 % (ref 0.0–0.2)

## 2020-03-02 LAB — PROTIME-INR
INR: 1.2 (ref 0.8–1.2)
Prothrombin Time: 14.3 s (ref 11.4–15.2)

## 2020-03-02 LAB — HEMOGLOBIN A1C
Hgb A1c MFr Bld: 5.2 % (ref 4.8–5.6)
Mean Plasma Glucose: 102.54 mg/dL

## 2020-03-02 LAB — ETHANOL: Alcohol, Ethyl (B): <10 mg/dL

## 2020-03-02 LAB — ECHOCARDIOGRAM COMPLETE

## 2020-03-02 LAB — APTT: aPTT: 25 seconds (ref 24–36)

## 2020-03-02 LAB — CBG MONITORING, ED: Glucose-Capillary: 185 mg/dL — ABNORMAL HIGH (ref 70–99)

## 2020-03-02 MED ORDER — IOHEXOL 350 MG/ML SOLN
100.0000 mL | Freq: Once | INTRAVENOUS | Status: AC | PRN
  Administered 2020-03-02: 100 mL via INTRAVENOUS

## 2020-03-02 MED ORDER — PERFLUTREN LIPID MICROSPHERE
1.0000 mL | INTRAVENOUS | Status: AC | PRN
  Administered 2020-03-02: 2.5 mL via INTRAVENOUS
  Filled 2020-03-02: qty 10

## 2020-03-02 MED ORDER — ATORVASTATIN CALCIUM 40 MG PO TABS
40.0000 mg | ORAL_TABLET | Freq: Every day | ORAL | Status: DC
  Administered 2020-03-02 – 2020-03-06 (×5): 40 mg via ORAL
  Filled 2020-03-02 (×5): qty 1

## 2020-03-02 MED ORDER — LEVETIRACETAM IN NACL 1000 MG/100ML IV SOLN
1000.0000 mg | Freq: Two times a day (BID) | INTRAVENOUS | Status: DC
  Administered 2020-03-02 – 2020-03-05 (×6): 1000 mg via INTRAVENOUS
  Filled 2020-03-02 (×6): qty 100

## 2020-03-02 MED ORDER — APIXABAN 5 MG PO TABS
5.0000 mg | ORAL_TABLET | Freq: Two times a day (BID) | ORAL | Status: DC
  Administered 2020-03-02 – 2020-03-06 (×9): 5 mg via ORAL
  Filled 2020-03-02 (×10): qty 1

## 2020-03-02 MED ORDER — SODIUM CHLORIDE 0.9% FLUSH
3.0000 mL | Freq: Two times a day (BID) | INTRAVENOUS | Status: DC
  Administered 2020-03-02 – 2020-03-06 (×8): 3 mL via INTRAVENOUS

## 2020-03-02 MED ORDER — SODIUM CHLORIDE 0.9 % IV SOLN
INTRAVENOUS | Status: DC | PRN
  Administered 2020-03-02 – 2020-03-03 (×2): 250 mL via INTRAVENOUS

## 2020-03-02 MED ORDER — LEVETIRACETAM IN NACL 1000 MG/100ML IV SOLN: 1000.0000 mg | Freq: Two times a day (BID) | INTRAVENOUS | Status: DC

## 2020-03-02 MED ORDER — IPRATROPIUM-ALBUTEROL 0.5-2.5 (3) MG/3ML IN SOLN
3.0000 mL | Freq: Four times a day (QID) | RESPIRATORY_TRACT | Status: DC | PRN
  Administered 2020-03-03 – 2020-03-06 (×2): 3 mL via RESPIRATORY_TRACT
  Filled 2020-03-02 (×2): qty 3

## 2020-03-02 MED ORDER — LEVETIRACETAM IN NACL 1500 MG/100ML IV SOLN
1500.0000 mg | Freq: Once | INTRAVENOUS | Status: AC
  Administered 2020-03-02: 1500 mg via INTRAVENOUS
  Filled 2020-03-02: qty 100

## 2020-03-02 MED ORDER — ATORVASTATIN CALCIUM 40 MG PO TABS: 40.0000 mg | ORAL_TABLET | Freq: Every day | ORAL | Status: DC

## 2020-03-02 NOTE — Progress Notes (Signed)
EEG completed, results pending. 

## 2020-03-02 NOTE — ED Notes (Signed)
EEG at bedside, will start Keppra after EEG performed. Internal Med MD at bedside as well.

## 2020-03-02 NOTE — Progress Notes (Signed)
  Echocardiogram 2D Echocardiogram with definity has been performed.  Leta Jungling M 03/02/2020, 1:45 PM

## 2020-03-02 NOTE — ED Provider Notes (Addendum)
[facility] EMERGENCY DEPARTMENT Provider Note   CSN: [id number] Arrival date & time: 03/02/20  0543  An emergency department physician performed an initial assessment on this suspected stroke patient at 0546.  History Chief Complaint  Patient presents with  . Code Stroke    Kevin [name] is a 69 y.o. male.  Patient presents the emergency department emergency traffic secondary to code stroke.  Apparently patient was last seen well around 2130 by his wife and then was found on the floor this morning at 0 530 with slurred speech and right facial droop.  EMS activated code stroke and patient was hypertensive.  Brought him here for further evaluation.  Brief evaluation done prior to CT scans without significant neurologic deficit at that time.  Afterwards patient is back to baseline.  He does not complain of any recent illnesses, musculoskeletal pain from the fall or other associated symptoms.    Past Medical History:  Diagnosis Date  . Atrial fibrillation, permanent (HCC) 02/01/2016  . Blood transfusion without reported diagnosis   . CAD in native artery cath 2016 90% mRamus  02/01/2016  . CHF (congestive heart failure) (HCC)   . Cholelithiases 05/01/2012  . Claudication (HCC)    bilateral  . Cocaine abuse (HCC)   . Dysrhythmia    afib  . ETOH abuse   . Hepatitis C antibody test positive 04/2012  . Hyperpigmentation   . Hypertension   . PAD (peripheral artery disease) (HCC)   . Persistent atrial fibrillation (HCC) 08/23/2014  . Thrombocytopenia (HCC)   . Tobacco abuse     Patient Active Problem List   Diagnosis Date Noted  . Liver cirrhosis (HCC)   . Metabolic encephalopathy   . Chronic systolic CHF (congestive heart failure) (HCC)   . Paroxysmal atrial fibrillation (HCC)   . Essential hypertension   . HCV antibody positive   . Secondary hypertension, unspecified   . Elevated troponin   . Blood pressure instability   . CAD in native artery cath 2016  90% mRamus  02/01/2016  . Atrial fibrillation, permanent (HCC) 02/01/2016  . NSTEMI (non-ST elevated myocardial infarction) (HCC)   . Status epilepticus (HCC) 01/31/2016  . High anion gap metabolic acidosis   . Lactic acidosis   . Cerebral infarction due to unspecified mechanism   . Hypertensive emergency   . Seizures (HCC)   . CVA (cerebral infarction)   . TIA (transient ischemic attack) 08/23/2015  . CKD (chronic kidney disease) stage 3, GFR 30-59 ml/min 08/23/2015  . Acute ischemic stroke (HCC) 08/23/2015  . Abnormal nuclear stress test 09/27/2014  . H/O medication noncompliance 09/27/2014  . Unstable angina (HCC) 09/27/2014  . Persistent atrial fibrillation (HCC) 08/23/2014  . Chronic diastolic heart failure (HCC) 08/23/2014  . Substance abuse (HCC) 08/23/2014  . Erectile dysfunction 08/23/2014  . Cirrhosis of liver (HCC) 04/23/2014  . DOE (dyspnea on exertion) 04/19/2014  . Atrial fibrillation with RVR (HCC) 04/19/2014  . Dysphagia 04/19/2014  . Obesity 05/02/2012  . NSVT (nonsustained ventricular tachycardia) (HCC) 05/02/2012  . Thrombocytopenia (HCC) 05/02/2012  . Cholelithiases 05/01/2012  . Chest pain, no significant macrovascular CAD at cath 05/01/12; Likely due to elevated LVEDP & Afib with Diastolic HF. 04/30/2012  . Atrial fibrillation with RVR, converted to SR with TEE and DCCV 05/05/12 04/30/2012  . PAD (peripheral artery disease), PTA to Lt. SFA in 2009, total occlusion mid Rt SFA with collaterals  04/30/2012  . Cocaine abuse (HCC) 04/30/2012  . HTN (hypertension) -Accelerated  with End organ involvement 04/30/2012  . Tobacco abuse 04/30/2012  . ETOH abuse 04/30/2012  . Hepatitis C antibody test positive 04/23/2012    Past Surgical History:  Procedure Laterality Date  . Cardiolite     negative for ischemia  . CARDIOVASCULAR STRESS TEST    . CARDIOVERSION  05/05/2012   Procedure: CARDIOVERSION;  Surgeon: Thurmon Fair, MD;  Location: [facility] ENDOSCOPY;  Service:  Cardiovascular;  Laterality: N/A;  . CAROTID ARTERY ANGIOPLASTY    . DOPPLER ECHOCARDIOGRAPHY    . ENDOVASCULAR STENT INSERTION  right leg  . LEFT HEART CATHETERIZATION WITH CORONARY ANGIOGRAM N/A 05/01/2012   Procedure: LEFT HEART CATHETERIZATION WITH CORONARY ANGIOGRAM;  Surgeon: Marykay Lex, MD;  Location: [facility] CATH LAB;  Service: Cardiovascular;  Laterality: N/A;  . LEFT HEART CATHETERIZATION WITH CORONARY ANGIOGRAM N/A 09/27/2014   Procedure: LEFT HEART CATHETERIZATION WITH CORONARY ANGIOGRAM;  Surgeon: Chrystie Nose, MD;  Location: [facility] CATH LAB;  Service: Cardiovascular;  Laterality: N/A;  . TEE WITHOUT CARDIOVERSION  05/05/2012   Procedure: TRANSESOPHAGEAL ECHOCARDIOGRAM (TEE);  Surgeon: Thurmon Fair, MD;  Location: [facility] ENDOSCOPY;  Service: Cardiovascular;  Laterality: N/A;       Family History  Problem Relation Age of Onset  . Seizures Daughter   . Cancer Mother     Social History   Tobacco Use  . Smoking status: Current Every Day Smoker    Packs/day: 0.25    Years: 40.00    Pack years: 10.00    Types: Cigarettes  . Smokeless tobacco: Never Used  Substance Use Topics  . Alcohol use: Yes    Alcohol/week: 0.0 - 16.0 standard drinks    Comment: He used to drink up to 1/2 pint daily; 6 beers. Quit in March 2015  . Drug use: No    Comment: cocaine, marijuana    Home Medications Prior to Admission medications   Medication Sig Start Date End Date Taking? Authorizing Provider  albuterol (VENTOLIN HFA) 108 (90 Base) MCG/ACT inhaler Inhale 2 puffs into the lungs every 6 (six) hours as needed for wheezing or shortness of breath. 01/17/20   Olalere, Adewale A, MD  apixaban (ELIQUIS) 5 MG TABS tablet Take 1 tablet (5 mg total) by mouth 2 (two) times daily. 06/18/19   Bensimhon, Bevelyn Buckles, MD  buPROPion (WELLBUTRIN SR) 150 MG 12 hr tablet TAKE 1 TABLET BY MOUTH TWICE A DAY 04/09/16   Bensimhon, Bevelyn Buckles, MD  carvedilol (COREG) 25 MG tablet TAKE 1 TABLET BY MOUTH 2 TIMES DAILY WITH A  MEAL. 05/11/18   Bensimhon, Bevelyn Buckles, MD  folic acid (FOLVITE) 1 MG tablet Take 1 tablet (1 mg total) by mouth daily. Need appointment before anymore refills 02/27/15   Chrystie Nose, MD  furosemide (LASIX) 40 MG tablet Take 1 tablet (40 mg total) by mouth daily. 02/09/16   Drema Dallas, MD  levETIRAcetam (KEPPRA) 1000 MG tablet Take 1 tablet (1,000 mg total) by mouth 2 (two) times daily. 08/22/16   Bensimhon, Bevelyn Buckles, MD  Multiple Vitamin (MULTIVITAMIN WITH MINERALS) TABS tablet Take 1 tablet by mouth daily. Men's One a Day    [provider]  pantoprazole (PROTONIX) 40 MG tablet Take 1 tablet (40 mg total) by mouth at bedtime. 08/22/16   Bensimhon, Bevelyn Buckles, MD  rosuvastatin (CRESTOR) 20 MG tablet Take 20 mg by mouth at bedtime.    [provider]  sacubitril-valsartan (ENTRESTO) 97-103 MG Take 1 tablet by mouth 2 (two) times daily. 11/18/19   Bensimhon,  Bevelyn Buckles, MD  spironolactone (ALDACTONE) 25 MG tablet Take 0.5 tablets (12.5 mg total) by mouth daily. 01/14/20   Bensimhon, Bevelyn Buckles, MD    Allergies    Patient has no known allergies.  Review of Systems   Review of Systems  All other systems reviewed and are negative.   Physical Exam Updated Vital Signs BP (!) 147/90   Pulse 82   Temp 97.8 F (36.6 C)   Resp 20   SpO2 96%   Physical Exam Vitals and nursing note reviewed.  Constitutional:      Appearance: He is well-developed.  HENT:     Head: Normocephalic and atraumatic.  Eyes:     Conjunctiva/sclera: Conjunctivae normal.  Cardiovascular:     Rate and Rhythm: Normal rate.  Pulmonary:     Effort: Pulmonary effort is normal. No respiratory distress.  Abdominal:     General: There is no distension.  Musculoskeletal:        General: No swelling, tenderness or deformity. Normal range of motion.     Cervical back: Normal range of motion.  Skin:    General: Skin is warm and dry.  Neurological:     General: No focal deficit present.     Mental Status:  He is alert.     ED Results / Procedures / Treatments   Labs (all labs ordered are listed, but only abnormal results are displayed) Labs Reviewed  I-STAT CHEM 8, ED - Abnormal; Notable for the following components:      Result Value   BUN 26 (*)    Creatinine, Ser 2.00 (*)    Glucose, Bld 197 (*)    Calcium, Ion 1.13 (*)    TCO2 17 (*)    All other components within normal limits  CBG MONITORING, ED - Abnormal; Notable for the following components:   Glucose-Capillary 185 (*)    All other components within normal limits  PROTIME-INR  APTT  ETHANOL  CBC  DIFFERENTIAL  COMPREHENSIVE METABOLIC PANEL  RAPID URINE DRUG SCREEN, HOSP PERFORMED  URINALYSIS, ROUTINE W REFLEX MICROSCOPIC    EKG None  Radiology CT ANGIO HEAD W OR WO CONTRAST  Result Date: 03/02/2020 CLINICAL DATA:  Stroke EXAM: CT ANGIOGRAPHY HEAD AND NECK CT PERFUSION BRAIN TECHNIQUE: Multidetector CT imaging of the head and neck was performed using the standard protocol during bolus administration of intravenous contrast. Multiplanar CT image reconstructions and MIPs were obtained to evaluate the vascular anatomy. Carotid stenosis measurements (when applicable) are obtained utilizing NASCET criteria, using the distal internal carotid diameter as the denominator. Multiphase CT imaging of the brain was performed following IV bolus contrast injection. Subsequent parametric perfusion maps were calculated using RAPID software. CONTRAST:  OMNIPAQUE IOHEXOL 350 MG/ML SOLN COMPARISON:  Neck CTA 08/26/2015 FINDINGS: CTA NECK FINDINGS Aortic arch: Atherosclerotic plaque. No acute finding. Three vessel branching Right carotid system: Moderate mixed density plaque at the bifurcation. At the distal margin of the bulb is an anterior wall primarily calcified plaque which causes 30% narrowing. Left carotid system: Mixed density plaque at the bifurcation without flow reducing stenosis or ulceration. Vertebral arteries: No proximal  subclavian stenosis or atherosclerosis. Calcified plaque at both V4 segments with assessment the left limited by intravenous contrast. There is likely high-grade left V1 segment stenosis. Otherwise the vertebral arteries are widely patent to the dura. Skeleton: Lower cervical spondylosis Other neck: No acute finding Upper chest: No acute finding Review of the MIP images confirms the above findings CTA HEAD FINDINGS  Anterior circulation: Diffusely calcified carotid siphons. No branch occlusion, flow limiting stenosis, or beading. Atheromatous changes seen to the bilateral M1 and downstream branches. Negative for aneurysm. Posterior circulation: The vertebral and basilar arteries are widely patent. Atheromatous irregularity to the bilateral posterior cerebral arteries with up to moderate narrowing at the right P4 level and at the left P2 segment. Negative for aneurysm Venous sinuses: Unremarkable in the arterial phase Anatomic variants: None significant Review of the MIP images confirms the above findings CT Brain Perfusion Findings: ASPECTS: 10 CBF (<30%) Volume: 52mL Perfusion (Tmax>6.0s) volume: 51mL in the right cerebral white matter-possibly noise given the setting of right-sided weakness. IMPRESSION: 1. No emergent large vessel occlusion. 2. Cervical carotid atherosclerosis without flow reducing stenosis or ulceration. 3. Atherosclerosis and potentially high-grade narrowing of the left V1 segment with assessment limited by artifact from intravenous contrast. 4. Intracranial atherosclerosis without flow reducing stenosis and major vessels. Electronically Signed   By: Marnee Spring M.D.   On: 03/02/2020 06:21   CT ANGIO NECK W OR WO CONTRAST  Result Date: 03/02/2020 CLINICAL DATA:  Stroke EXAM: CT ANGIOGRAPHY HEAD AND NECK CT PERFUSION BRAIN TECHNIQUE: Multidetector CT imaging of the head and neck was performed using the standard protocol during bolus administration of intravenous contrast. Multiplanar CT  image reconstructions and MIPs were obtained to evaluate the vascular anatomy. Carotid stenosis measurements (when applicable) are obtained utilizing NASCET criteria, using the distal internal carotid diameter as the denominator. Multiphase CT imaging of the brain was performed following IV bolus contrast injection. Subsequent parametric perfusion maps were calculated using RAPID software. CONTRAST:  OMNIPAQUE IOHEXOL 350 MG/ML SOLN COMPARISON:  Neck CTA 08/26/2015 FINDINGS: CTA NECK FINDINGS Aortic arch: Atherosclerotic plaque. No acute finding. Three vessel branching Right carotid system: Moderate mixed density plaque at the bifurcation. At the distal margin of the bulb is an anterior wall primarily calcified plaque which causes 30% narrowing. Left carotid system: Mixed density plaque at the bifurcation without flow reducing stenosis or ulceration. Vertebral arteries: No proximal subclavian stenosis or atherosclerosis. Calcified plaque at both V4 segments with assessment the left limited by intravenous contrast. There is likely high-grade left V1 segment stenosis. Otherwise the vertebral arteries are widely patent to the dura. Skeleton: Lower cervical spondylosis Other neck: No acute finding Upper chest: No acute finding Review of the MIP images confirms the above findings CTA HEAD FINDINGS Anterior circulation: Diffusely calcified carotid siphons. No branch occlusion, flow limiting stenosis, or beading. Atheromatous changes seen to the bilateral M1 and downstream branches. Negative for aneurysm. Posterior circulation: The vertebral and basilar arteries are widely patent. Atheromatous irregularity to the bilateral posterior cerebral arteries with up to moderate narrowing at the right P4 level and at the left P2 segment. Negative for aneurysm Venous sinuses: Unremarkable in the arterial phase Anatomic variants: None significant Review of the MIP images confirms the above findings CT Brain Perfusion Findings:  ASPECTS: 10 CBF (<30%) Volume: 34mL Perfusion (Tmax>6.0s) volume: 75mL in the right cerebral white matter-possibly noise given the setting of right-sided weakness. IMPRESSION: 1. No emergent large vessel occlusion. 2. Cervical carotid atherosclerosis without flow reducing stenosis or ulceration. 3. Atherosclerosis and potentially high-grade narrowing of the left V1 segment with assessment limited by artifact from intravenous contrast. 4. Intracranial atherosclerosis without flow reducing stenosis and major vessels. Electronically Signed   By: Marnee Spring M.D.   On: 03/02/2020 06:21   CT CEREBRAL PERFUSION W CONTRAST  Result Date: 03/02/2020 CLINICAL DATA:  Stroke EXAM: CT ANGIOGRAPHY HEAD  AND NECK CT PERFUSION BRAIN TECHNIQUE: Multidetector CT imaging of the head and neck was performed using the standard protocol during bolus administration of intravenous contrast. Multiplanar CT image reconstructions and MIPs were obtained to evaluate the vascular anatomy. Carotid stenosis measurements (when applicable) are obtained utilizing NASCET criteria, using the distal internal carotid diameter as the denominator. Multiphase CT imaging of the brain was performed following IV bolus contrast injection. Subsequent parametric perfusion maps were calculated using RAPID software. CONTRAST:  120mL OMNIPAQUE IOHEXOL 350 MG/ML SOLN COMPARISON:  Neck CTA 08/26/2015 FINDINGS: CTA NECK FINDINGS Aortic arch: Atherosclerotic plaque. No acute finding. Three vessel branching Right carotid system: Moderate mixed density plaque at the bifurcation. At the distal margin of the bulb is an anterior wall primarily calcified plaque which causes 30% narrowing. Left carotid system: Mixed density plaque at the bifurcation without flow reducing stenosis or ulceration. Vertebral arteries: No proximal subclavian stenosis or atherosclerosis. Calcified plaque at both V4 segments with assessment the left limited by intravenous contrast. There is  likely high-grade left V1 segment stenosis. Otherwise the vertebral arteries are widely patent to the dura. Skeleton: Lower cervical spondylosis Other neck: No acute finding Upper chest: No acute finding Review of the MIP images confirms the above findings CTA HEAD FINDINGS Anterior circulation: Diffusely calcified carotid siphons. No branch occlusion, flow limiting stenosis, or beading. Atheromatous changes seen to the bilateral M1 and downstream branches. Negative for aneurysm. Posterior circulation: The vertebral and basilar arteries are widely patent. Atheromatous irregularity to the bilateral posterior cerebral arteries with up to moderate narrowing at the right P4 level and at the left P2 segment. Negative for aneurysm Venous sinuses: Unremarkable in the arterial phase Anatomic variants: None significant Review of the MIP images confirms the above findings CT Brain Perfusion Findings: ASPECTS: 10 CBF (<30%) Volume: 48mL Perfusion (Tmax>6.0s) volume: 75mL in the right cerebral white matter-possibly noise given the setting of right-sided weakness. IMPRESSION: 1. No emergent large vessel occlusion. 2. Cervical carotid atherosclerosis without flow reducing stenosis or ulceration. 3. Atherosclerosis and potentially high-grade narrowing of the left V1 segment with assessment limited by artifact from intravenous contrast. 4. Intracranial atherosclerosis without flow reducing stenosis and major vessels. Electronically Signed   By: Monte Fantasia M.D.   On: 03/02/2020 06:21   CT HEAD CODE STROKE WO CONTRAST  Result Date: 03/02/2020 CLINICAL DATA:  Code stroke. Right-sided weakness and slurred speech EXAM: CT HEAD WITHOUT CONTRAST TECHNIQUE: Contiguous axial images were obtained from the base of the skull through the vertex without intravenous contrast. COMPARISON:  02/04/2016 FINDINGS: Brain: No evidence of acute infarction, hemorrhage, hydrocephalus, extra-axial collection or mass lesion/mass effect. Moderate  remote right occipital infarct, cortically based. Small remote appearing bilateral deep gray nuclei infarctions. Chronic small vessel ischemia in the cerebral white matter. Vascular: Atherosclerotic calcification.  No hyperdense vessel. Skull: Enlarged by parietal foramina Sinuses/Orbits: Negative Other: These results were communicated to Dr. Lorraine Lax at 5:58 amon 6/10/2021by text page via the [facility] messaging system. ASPECTS [facility] Stroke Program Early CT Score) - Ganglionic level infarction (caudate, lentiform nuclei, internal capsule, insula, M1-M3 cortex): 7 - Supraganglionic infarction (M4-M6 cortex): 3 Total score (0-10 with 10 being normal): 10 IMPRESSION: 1. No acute finding 2. Chronic small vessel ischemia with chronic appearing lacunar infarcts. 3. Remote right occipital cortex infarct. Electronically Signed   By: Monte Fantasia M.D.   On: 03/02/2020 05:59    Procedures .Critical Care Performed by: Merrily Pew, MD Authorized by: Merrily Pew, MD   Critical care provider statement:  Critical care time (minutes):  45   Critical care was necessary to treat or prevent imminent or life-threatening deterioration of the following conditions:  CNS failure or compromise   Critical care was time spent personally by me on the following activities:  Discussions with consultants, evaluation of patient's response to treatment, examination of patient, ordering and performing treatments and interventions, ordering and review of laboratory studies, ordering and review of radiographic studies, pulse oximetry, re-evaluation of patient's condition, obtaining history from patient or surrogate and review of old charts   I assumed direction of critical care for this patient from another provider in my specialty: no     (including critical care time)  Medications Ordered in ED Medications  iohexol (OMNIPAQUE) 350 MG/ML injection 100 mL (100 mLs Intravenous Contrast Given 03/02/20 0558)    ED Course  I  have reviewed the triage vital signs and the nursing notes.  Pertinent labs & imaging results that were available during my care of the patient were reviewed by me and considered in my medical decision making (see chart for details).    MDM Rules/Calculators/A&P                         CVA, symptoms resolved. Possible seizure?  Plan for observation, repeat MRI and echo. If normal, need EEG. If cva, continue cva treatment.   Final Clinical Impression(s) / ED Diagnoses Final diagnoses:  None    Rx / DC Orders ED Discharge Orders    None       Jonica Bickhart, Barbara Cower, MD 03/02/20 7829    Marily Memos, MD 03/02/20 [phone]

## 2020-03-02 NOTE — Procedures (Signed)
Patient Name: Kevin [name]  MRN: [id number]  Epilepsy Attending: Charlsie Quest  Referring Physician/Provider: Dr. Georgiana Spinner Aroor Date: 03/02/2020 Duration: 23.22 minutes  Patient history: 69 year old male with history of seizures as well as stroke who presented with right-sided weakness.  EEG evaluate for seizures.  Level of alertness: Awake, drowsy, sleep, comatose, lethargic  AEDs during EEG study: Keppra  Technical aspects: This EEG study was done with scalp electrodes positioned according to the 10-20 International system of electrode placement. Electrical activity was acquired at a sampling rate of 500Hz  and reviewed with a high frequency filter of 70Hz  and a low frequency filter of 1Hz . EEG data were recorded continuously and digitally stored.   Description: The posterior dominant rhythm consists of 8-9 Hz activity of moderate voltage (25-35 uV) seen predominantly in posterior head regions, symmetric and reactive to eye opening and eye closing. EEG showed intermittent generalized and lateralized 3 to 6 Hz theta-delta slowing. Hyperventilation and photic stimulation were not performed.     ABNORMALITY -Intermittent slow, generalized and lateralized left hemisphere  IMPRESSION: This study is suggestive of nonspecific cortical dysfunction in left hemisphere as well as mild diffuse encephalopathy, nonspecific to etiology. No seizures or epileptiform discharges were seen throughout the recording.  Kevin [name] 

## 2020-03-02 NOTE — ED Notes (Signed)
This RN spoke with Joy in MRI who advised they would be sending for patient soon.

## 2020-03-02 NOTE — Progress Notes (Signed)
Pt has BP of 199/96, hold PT until MRI determines need for permissive HTN.  Follow up after procedure as time and pt allow.   03/02/20 1100  PT Visit Information  Last PT Received On 03/02/20  Reason Eval/Treat Not Completed Medical issues which prohibited therapy    Samul Dada, PT MS Acute Rehab Dept. Number: [facility] [id number] and [facility] [phone]

## 2020-03-02 NOTE — Progress Notes (Signed)
Notified MD on call of blood pressures for pt.  Received call back from MD on call, he stated he realizes the diastolic is above permissive parameters but he does not want to drop systolic to fast. Will monitor.

## 2020-03-02 NOTE — Consult Note (Addendum)
Requesting Physician: Dr. Clayborne Dana    Chief Complaint: Right side weakness, slurred speech  History obtained from: Patient and Chart   HPI:                                                                                                                                       Kevin [name] is a 69 y.o. male with complex past medical history of atrial fibrillation on Eliquis, hypertension, hyperlipidemia, chronic systolic heart failure, coronary artery disease, peripheral artery disease, cirrhosis with thrombocytopenia, hepatitis C, alcohol abuse, tobacco abuse as well as history of seizures presenting with status epilepticus on May 2017.  He presents to the emergency department as a code stroke after waking up with right-sided weakness and slurred speech.   Last known normal was 9:30 PM when patient went to bed.  When he woke up around 5 AM patient had fallen off the bed and his speech was slurred.  Wife called EMS and when they arrived patient was noted to be slightly weaker in the right upper extremity.  They also felt patient had left gaze preference.  Blood pressure was 170 systolic and blood sugar was 179.  On arrival to [facility] ER, patient had dysarthria, right-sided weakness with no obvious facial droop or neglect.  Denies any vision symptoms.  Was taken immediately to CT scan which showed no hemorrhage.  tPA was not administered as patient was outside the window and he is on Eliquis which he is compliant.  CT angiogram (despite CKD) was performed due to high risk of large vessel occlusion and history by EMS of left side preference.  CT angiogram was negative for large vessel occlusion and a CT perfusion did not show obvious perfusion deficit.  Patient does have a history of presenting with status epilepticus in the past.  Patient does take 1 g twice a day Keppra.   Date last known well: 03/01/2020 Time last known well: 9:30 PM tPA Given: No, on Eliquis NIHSS: 6 Baseline MRS 1  Past  Medical History:  Diagnosis Date  . Atrial fibrillation, permanent (HCC) 02/01/2016  . Blood transfusion without reported diagnosis   . CAD in native artery cath 2016 90% mRamus  02/01/2016  . CHF (congestive heart failure) (HCC)   . Cholelithiases 05/01/2012  . Claudication (HCC)    bilateral  . Cocaine abuse (HCC)   . Dysrhythmia    afib  . ETOH abuse   . Hepatitis C antibody test positive 04/2012  . Hyperpigmentation   . Hypertension   . PAD (peripheral artery disease) (HCC)   . Persistent atrial fibrillation (HCC) 08/23/2014  . Thrombocytopenia (HCC)   . Tobacco abuse     Past Surgical History:  Procedure Laterality Date  . Cardiolite     negative for ischemia  . CARDIOVASCULAR STRESS TEST    . CARDIOVERSION  05/05/2012   Procedure: CARDIOVERSION;  Surgeon: Rachelle Hora Croitoru,  MD;  Location: [facility] ENDOSCOPY;  Service: Cardiovascular;  Laterality: N/A;  . CAROTID ARTERY ANGIOPLASTY    . DOPPLER ECHOCARDIOGRAPHY    . ENDOVASCULAR STENT INSERTION  right leg  . LEFT HEART CATHETERIZATION WITH CORONARY ANGIOGRAM N/A 05/01/2012   Procedure: LEFT HEART CATHETERIZATION WITH CORONARY ANGIOGRAM;  Surgeon: Marykay Lex, MD;  Location: [facility] CATH LAB;  Service: Cardiovascular;  Laterality: N/A;  . LEFT HEART CATHETERIZATION WITH CORONARY ANGIOGRAM N/A 09/27/2014   Procedure: LEFT HEART CATHETERIZATION WITH CORONARY ANGIOGRAM;  Surgeon: Chrystie Nose, MD;  Location: [facility] CATH LAB;  Service: Cardiovascular;  Laterality: N/A;  . TEE WITHOUT CARDIOVERSION  05/05/2012   Procedure: TRANSESOPHAGEAL ECHOCARDIOGRAM (TEE);  Surgeon: Thurmon Fair, MD;  Location: [facility] ENDOSCOPY;  Service: Cardiovascular;  Laterality: N/A;    Family History  Problem Relation Age of Onset  . Seizures Daughter   . Cancer Mother    Social History:  reports that he has been smoking cigarettes. He has a 10.00 pack-year smoking history. He has never used smokeless tobacco. He reports current alcohol use. He reports that he does not  use drugs.  Allergies: No Known Allergies  Medications:                                                                                                                        I reviewed home medications   ROS:                                                                                                                                     14 systems reviewed and negative except above   Examination:                                                                                                      General: Appears well-developed  Psych: Affect appropriate to situation Eyes: No scleral injection HENT: No OP obstrucion Head: Normocephalic.  Cardiovascular: Normal rate and regular rhythm Respiratory: Effort normal and breath sounds  normal to anterior ascultation GI: Soft.  No distension. There is no tenderness.  Skin: WDI    Neurological Examination Mental Status: Alert, oriented, thought content mostly appropriate-incorrectly identifies a month.  Speech fluent without evidence of aphasia.  Speech is dysarthric.  Able to follow 3 step commands without difficulty. Cranial Nerves: II: Visual fields grossly normal,  III,IV, VI: ptosis not present, extra-ocular motions intact bilaterally, pupils equal, round, reactive to light and accommodation V,VII: smile symmetric, facial light touch sensation normal bilaterally VIII: hearing normal bilaterally IX,X: uvula rises symmetrically XI: bilateral shoulder shrug XII: midline tongue extension Motor: Right : Upper extremity   4/5    Left:     Upper extremity   5/5  Lower extremity   2/5     Lower extremity   5/5 Tone and bulk:normal tone throughout; no atrophy noted Sensory: Pinprick and light touch intact throughout, bilaterally Plantars: Right: downgoing   Left: downgoing Cerebellar: normal finger-to-nose, normal rapid alternating movements and normal heel-to-shin test      Lab Results: Basic Metabolic Panel: Recent Labs  Lab  03/02/20 0553  NA 139  K 4.2  CL 109  GLUCOSE 197*  BUN 26*  CREATININE 2.00*    CBC: Recent Labs  Lab 03/02/20 0553  HGB 15.0  HCT 44.0    Coagulation Studies: Recent Labs    03/02/20 0547  LABPROT 14.3  INR 1.2    Imaging: CT ANGIO HEAD W OR WO CONTRAST  Result Date: 03/02/2020 CLINICAL DATA:  Stroke EXAM: CT ANGIOGRAPHY HEAD AND NECK CT PERFUSION BRAIN TECHNIQUE: Multidetector CT imaging of the head and neck was performed using the standard protocol during bolus administration of intravenous contrast. Multiplanar CT image reconstructions and MIPs were obtained to evaluate the vascular anatomy. Carotid stenosis measurements (when applicable) are obtained utilizing NASCET criteria, using the distal internal carotid diameter as the denominator. Multiphase CT imaging of the brain was performed following IV bolus contrast injection. Subsequent parametric perfusion maps were calculated using RAPID software. CONTRAST:  OMNIPAQUE IOHEXOL 350 MG/ML SOLN COMPARISON:  Neck CTA 08/26/2015 FINDINGS: CTA NECK FINDINGS Aortic arch: Atherosclerotic plaque. No acute finding. Three vessel branching Right carotid system: Moderate mixed density plaque at the bifurcation. At the distal margin of the bulb is an anterior wall primarily calcified plaque which causes 30% narrowing. Left carotid system: Mixed density plaque at the bifurcation without flow reducing stenosis or ulceration. Vertebral arteries: No proximal subclavian stenosis or atherosclerosis. Calcified plaque at both V4 segments with assessment the left limited by intravenous contrast. There is likely high-grade left V1 segment stenosis. Otherwise the vertebral arteries are widely patent to the dura. Skeleton: Lower cervical spondylosis Other neck: No acute finding Upper chest: No acute finding Review of the MIP images confirms the above findings CTA HEAD FINDINGS Anterior circulation: Diffusely calcified carotid siphons. No branch  occlusion, flow limiting stenosis, or beading. Atheromatous changes seen to the bilateral M1 and downstream branches. Negative for aneurysm. Posterior circulation: The vertebral and basilar arteries are widely patent. Atheromatous irregularity to the bilateral posterior cerebral arteries with up to moderate narrowing at the right P4 level and at the left P2 segment. Negative for aneurysm Venous sinuses: Unremarkable in the arterial phase Anatomic variants: None significant Review of the MIP images confirms the above findings CT Brain Perfusion Findings: ASPECTS: 10 CBF (<30%) Volume: 35mL Perfusion (Tmax>6.0s) volume: 43mL in the right cerebral white matter-possibly noise given the setting of right-sided weakness. IMPRESSION: 1. No emergent large vessel occlusion. 2.  Cervical carotid atherosclerosis without flow reducing stenosis or ulceration. 3. Atherosclerosis and potentially high-grade narrowing of the left V1 segment with assessment limited by artifact from intravenous contrast. 4. Intracranial atherosclerosis without flow reducing stenosis and major vessels. Electronically Signed   By: Monte Fantasia M.D.   On: 03/02/2020 06:21   CT ANGIO NECK W OR WO CONTRAST  Result Date: 03/02/2020 CLINICAL DATA:  Stroke EXAM: CT ANGIOGRAPHY HEAD AND NECK CT PERFUSION BRAIN TECHNIQUE: Multidetector CT imaging of the head and neck was performed using the standard protocol during bolus administration of intravenous contrast. Multiplanar CT image reconstructions and MIPs were obtained to evaluate the vascular anatomy. Carotid stenosis measurements (when applicable) are obtained utilizing NASCET criteria, using the distal internal carotid diameter as the denominator. Multiphase CT imaging of the brain was performed following IV bolus contrast injection. Subsequent parametric perfusion maps were calculated using RAPID software. CONTRAST:  153mL OMNIPAQUE IOHEXOL 350 MG/ML SOLN COMPARISON:  Neck CTA 08/26/2015 FINDINGS: CTA  NECK FINDINGS Aortic arch: Atherosclerotic plaque. No acute finding. Three vessel branching Right carotid system: Moderate mixed density plaque at the bifurcation. At the distal margin of the bulb is an anterior wall primarily calcified plaque which causes 30% narrowing. Left carotid system: Mixed density plaque at the bifurcation without flow reducing stenosis or ulceration. Vertebral arteries: No proximal subclavian stenosis or atherosclerosis. Calcified plaque at both V4 segments with assessment the left limited by intravenous contrast. There is likely high-grade left V1 segment stenosis. Otherwise the vertebral arteries are widely patent to the dura. Skeleton: Lower cervical spondylosis Other neck: No acute finding Upper chest: No acute finding Review of the MIP images confirms the above findings CTA HEAD FINDINGS Anterior circulation: Diffusely calcified carotid siphons. No branch occlusion, flow limiting stenosis, or beading. Atheromatous changes seen to the bilateral M1 and downstream branches. Negative for aneurysm. Posterior circulation: The vertebral and basilar arteries are widely patent. Atheromatous irregularity to the bilateral posterior cerebral arteries with up to moderate narrowing at the right P4 level and at the left P2 segment. Negative for aneurysm Venous sinuses: Unremarkable in the arterial phase Anatomic variants: None significant Review of the MIP images confirms the above findings CT Brain Perfusion Findings: ASPECTS: 10 CBF (<30%) Volume: 86mL Perfusion (Tmax>6.0s) volume: 80mL in the right cerebral white matter-possibly noise given the setting of right-sided weakness. IMPRESSION: 1. No emergent large vessel occlusion. 2. Cervical carotid atherosclerosis without flow reducing stenosis or ulceration. 3. Atherosclerosis and potentially high-grade narrowing of the left V1 segment with assessment limited by artifact from intravenous contrast. 4. Intracranial atherosclerosis without flow reducing  stenosis and major vessels. Electronically Signed   By: Monte Fantasia M.D.   On: 03/02/2020 06:21   CT CEREBRAL PERFUSION W CONTRAST  Result Date: 03/02/2020 CLINICAL DATA:  Stroke EXAM: CT ANGIOGRAPHY HEAD AND NECK CT PERFUSION BRAIN TECHNIQUE: Multidetector CT imaging of the head and neck was performed using the standard protocol during bolus administration of intravenous contrast. Multiplanar CT image reconstructions and MIPs were obtained to evaluate the vascular anatomy. Carotid stenosis measurements (when applicable) are obtained utilizing NASCET criteria, using the distal internal carotid diameter as the denominator. Multiphase CT imaging of the brain was performed following IV bolus contrast injection. Subsequent parametric perfusion maps were calculated using RAPID software. CONTRAST:  127mL OMNIPAQUE IOHEXOL 350 MG/ML SOLN COMPARISON:  Neck CTA 08/26/2015 FINDINGS: CTA NECK FINDINGS Aortic arch: Atherosclerotic plaque. No acute finding. Three vessel branching Right carotid system: Moderate mixed density plaque at the bifurcation. At the  distal margin of the bulb is an anterior wall primarily calcified plaque which causes 30% narrowing. Left carotid system: Mixed density plaque at the bifurcation without flow reducing stenosis or ulceration. Vertebral arteries: No proximal subclavian stenosis or atherosclerosis. Calcified plaque at both V4 segments with assessment the left limited by intravenous contrast. There is likely high-grade left V1 segment stenosis. Otherwise the vertebral arteries are widely patent to the dura. Skeleton: Lower cervical spondylosis Other neck: No acute finding Upper chest: No acute finding Review of the MIP images confirms the above findings CTA HEAD FINDINGS Anterior circulation: Diffusely calcified carotid siphons. No branch occlusion, flow limiting stenosis, or beading. Atheromatous changes seen to the bilateral M1 and downstream branches. Negative for aneurysm. Posterior  circulation: The vertebral and basilar arteries are widely patent. Atheromatous irregularity to the bilateral posterior cerebral arteries with up to moderate narrowing at the right P4 level and at the left P2 segment. Negative for aneurysm Venous sinuses: Unremarkable in the arterial phase Anatomic variants: None significant Review of the MIP images confirms the above findings CT Brain Perfusion Findings: ASPECTS: 10 CBF (<30%) Volume: 20mL Perfusion (Tmax>6.0s) volume: 51mL in the right cerebral white matter-possibly noise given the setting of right-sided weakness. IMPRESSION: 1. No emergent large vessel occlusion. 2. Cervical carotid atherosclerosis without flow reducing stenosis or ulceration. 3. Atherosclerosis and potentially high-grade narrowing of the left V1 segment with assessment limited by artifact from intravenous contrast. 4. Intracranial atherosclerosis without flow reducing stenosis and major vessels. Electronically Signed   By: Marnee Spring M.D.   On: 03/02/2020 06:21   CT HEAD CODE STROKE WO CONTRAST  Result Date: 03/02/2020 CLINICAL DATA:  Code stroke. Right-sided weakness and slurred speech EXAM: CT HEAD WITHOUT CONTRAST TECHNIQUE: Contiguous axial images were obtained from the base of the skull through the vertex without intravenous contrast. COMPARISON:  02/04/2016 FINDINGS: Brain: No evidence of acute infarction, hemorrhage, hydrocephalus, extra-axial collection or mass lesion/mass effect. Moderate remote right occipital infarct, cortically based. Small remote appearing bilateral deep gray nuclei infarctions. Chronic small vessel ischemia in the cerebral white matter. Vascular: Atherosclerotic calcification.  No hyperdense vessel. Skull: Enlarged by parietal foramina Sinuses/Orbits: Negative Other: These results were communicated to Dr. Laurence Slate at 5:58 amon 6/10/2021by text page via the [facility] messaging system. ASPECTS [facility] Stroke Program Early CT Score) - Ganglionic level infarction  (caudate, lentiform nuclei, internal capsule, insula, M1-M3 cortex): 7 - Supraganglionic infarction (M4-M6 cortex): 3 Total score (0-10 with 10 being normal): 10 IMPRESSION: 1. No acute finding 2. Chronic small vessel ischemia with chronic appearing lacunar infarcts. 3. Remote right occipital cortex infarct. Electronically Signed   By: Marnee Spring M.D.   On: 03/02/2020 05:59     ASSESSMENT AND PLAN  69 yr male with above complex history of seizure as well as Afib on eliquis with acute right side weakness. No tPA as he is on [facility], CTA negative for LVO. Likely lacunar stroke vs Seizure with post ictal todd's paresis.  Seizures vs Acute ischemic stroke  Recommend # STAT EEG  and load with Keppra 1.5 g # MRI of the brain without contrast #Continue Eliquis #Transthoracic Echo  #Start or continue Atorvastatin 40 mg/other high intensity statin # BP goal: permissive HTN upto 185/110 mmHg # HBAIC and Lipid profile # Telemetry monitoring # Frequent neuro checks # stroke swallow screen  Please page stroke NP  Or  PA  Or MD from 8am -4 pm  as this patient from this time will be  followed by the  stroke.   You can look them up on [email or web address]  Password [facility]   Sephira Zellman [facility] Pager Number [phone]

## 2020-03-02 NOTE — Progress Notes (Addendum)
Multiple strokes on MRI,  Echo pending, no clear embolic source on CTA. Stroke team to follow.   Ritta Slot, MD [facility] [phone]  If 7pm- 7am, please page neurology on call as listed in AMION.

## 2020-03-02 NOTE — H&P (Signed)
Date: 03/02/2020               Patient Name:  Kevin [name] MRN: [id number]  DOB: 1951/03/04 Age / Sex: 69 y.o., male   PCP: Norm Salt, PA         Medical Service: Internal Medicine Teaching Service         Attending Physician: Dr. Earl Lagos, MD    First Contact: Dr. Marchia Bond Pager: [phone]  Second Contact: Dr. Cleaster Corin Pager: [phone]       After Hours (After 5p/  First Contact Pager: [phone]  weekends / holidays): Second Contact Pager: [phone]   Chief Complaint: R-sided weakness, Slurred speach  History of Present Illness: Mr Birdwell is a 69 yo M with Hx of HTN, CKD, CAD, CHF, A-fib, PAD, Cocaine use, Thrombocytopenia, Cirrhosis, CVA, Seizures, and GERD who presented to the ED after waking up in the morning with right sided weakness, facial droop and slurred speech. He was fund on the floor by his wife at around 530 am. EMS was called and Patient was transported to ED as code stroke. His symptoms reportedly improved while in the ED and his CTs were unrevealing. Upon my interview, patient was again having right sided weakness and some mile left sided weakness, he was also having dysarthria (unable to speak at all at times), and had some difficulty following commands. EEG was underway when seen.   [name] states he had been on keppra in the past, but did not think he had been taking it for some time. Per wife he has had no preceding symptoms this week. Unable to complete full ROS due to patients inability to speak at times.  ED workup showed Cr 1.95, stable from 2 months ago. Bicarb 17, Plts 53. Meds:  No current facility-administered medications on file prior to encounter.   Current Outpatient Medications on File Prior to Encounter  Medication Sig  . albuterol (VENTOLIN HFA) 108 (90 Base) MCG/ACT inhaler Inhale 2 puffs into the lungs every 6 (six) hours as needed for wheezing or shortness of breath.  Marland Kitchen apixaban (ELIQUIS) 5 MG TABS tablet Take 1 tablet (5 mg  total) by mouth 2 (two) times daily.  Marland Kitchen buPROPion (WELLBUTRIN SR) 150 MG 12 hr tablet TAKE 1 TABLET BY MOUTH TWICE A DAY (Patient taking differently: Take 150 mg by mouth 2 (two) times daily. )  . carvedilol (COREG) 25 MG tablet TAKE 1 TABLET BY MOUTH 2 TIMES DAILY WITH A MEAL. (Patient taking differently: Take 25 mg by mouth 2 (two) times daily with a meal. )  . folic acid (FOLVITE) 1 MG tablet Take 1 tablet (1 mg total) by mouth daily. Need appointment before anymore refills  . furosemide (LASIX) 40 MG tablet Take 1 tablet (40 mg total) by mouth daily.  Marland Kitchen levETIRAcetam (KEPPRA) 1000 MG tablet Take 1 tablet (1,000 mg total) by mouth 2 (two) times daily.  . Multiple Vitamin (MULTIVITAMIN WITH MINERALS) TABS tablet Take 1 tablet by mouth daily. Men's One a Day  . pantoprazole (PROTONIX) 40 MG tablet Take 1 tablet (40 mg total) by mouth at bedtime.  . rosuvastatin (CRESTOR) 20 MG tablet Take 20 mg by mouth at bedtime.  . sacubitril-valsartan (ENTRESTO) 97-103 MG Take 1 tablet by mouth 2 (two) times daily.  Marland Kitchen spironolactone (ALDACTONE) 25 MG tablet Take 0.5 tablets (12.5 mg total) by mouth daily.  . TRELEGY ELLIPTA 100-62.5-25 MCG/INH AEPB Take 1 puff by mouth daily.   Allergies: Allergies  as of 03/02/2020  . (No Known Allergies)   Past Medical History:  Diagnosis Date  . Atrial fibrillation, permanent (HCC) 02/01/2016  . Blood transfusion without reported diagnosis   . CAD in native artery cath 2016 90% mRamus  02/01/2016  . CHF (congestive heart failure) (HCC)   . Cholelithiases 05/01/2012  . Claudication (HCC)    bilateral  . Cocaine abuse (HCC)   . Dysrhythmia    afib  . ETOH abuse   . Hepatitis C antibody test positive 04/2012  . Hyperpigmentation   . Hypertension   . PAD (peripheral artery disease) (HCC)   . Persistent atrial fibrillation (HCC) 08/23/2014  . Thrombocytopenia (HCC)   . Tobacco abuse     Family History:  Family History  Problem Relation Age of Onset  . Seizures  Daughter   . Cancer Mother    Social History:  Social History   Tobacco Use  . Smoking status: Current Every Day Smoker    Packs/day: 0.25    Years: 40.00    Pack years: 10.00    Types: Cigarettes  . Smokeless tobacco: Never Used  Substance Use Topics  . Alcohol use: Yes    Alcohol/week: 0.0 - 16.0 standard drinks    Comment: He used to drink up to 1/2 pint daily; 6 beers. Quit in March 2015  . Drug use: No    Comment: cocaine, marijuana   Review of Systems: Unable to complete full ROS due to patients inability to speak at times.  Physical Exam: Blood pressure (!) 163/94, pulse 87, temperature 97.8 F (36.6 C), resp. rate 15, SpO2 99 %. Physical Exam Constitutional:      General: He is not in acute distress.    Appearance: Normal appearance.  Cardiovascular:     Rate and Rhythm: Normal rate. Rhythm irregular.     Pulses: Normal pulses.     Heart sounds: Normal heart sounds.  Pulmonary:     Effort: Pulmonary effort is normal. No respiratory distress.     Breath sounds: Normal breath sounds.  Abdominal:     General: Bowel sounds are normal. There is no distension.     Palpations: Abdomen is soft.     Tenderness: There is no abdominal tenderness.  Musculoskeletal:        General: No swelling or deformity.  Skin:    General: Skin is warm and dry.  Neurological:     Comments: Mental Status: Patient is awake, alert, oriented to self and hospital when able to speeck Appears Aphasic at times Cranial Nerves: II: Pupils: Right pupil larger than left, decreased light reflex.  III,IV, VI: EOMI without ptosis or diploplia.  V: Facial sensation not evaluated VII: Facial movement is symmetric.  VIII: hearing is intact to voice X: Uvula elevates symmetrically XI: Shoulder shrug not evaluated XII: tongue is midline without atrophy or fasciculations.  Motor: decreased effort thorughout, at Least 3-4/5 R UE/LE, 4-5/5 L UE/LE Sensory: Sensation not evaluated due to apparent  aphasia Cerebellar: Difficulty with following commands for Finger-Nose    EKG: personally reviewed my interpretation is atrial fibrillation, regular rate, RBBB  CT Head: IMPRESSION: 1. No acute finding 2. Chronic small vessel ischemia with chronic appearing lacunar infarcts. 3. Remote right occipital cortex infarct.  CTA Head Neck and CT Perfusion: IMPRESSION: 1. No emergent large vessel occlusion. 2. Cervical carotid atherosclerosis without flow reducing stenosis or ulceration. 3. Atherosclerosis and potentially high-grade narrowing of the left V1 segment with assessment limited by artifact from intravenous  contrast. 4. Intracranial atherosclerosis without flow reducing stenosis and major vessels.  Assessment & Plan by Problem: Active Problems:   Acute focal neurological deficit  Acute Neurologic Deficit CVA vs Siezure: Found down at 530 am by wife, had woken up with right sided weakness and slurred speech. Hx of both CVA and Seizures. Per significant other, not current on antiepileptics. Symptoms improving in ED initially, worsened on my exam. CTs without bleeding nor large vessel occlusion. Most likely CVA, will work up Seizure if MRI negative. Neurology is following. Possible embolic source if CVA, but is on anticoagulation. - Aprreciate neruology recommendations - EEG and IV Keppra given history of Seizure - MR Brain - Allow for permissive HTN (systolic < 193 and diastolic < 790) - Atorvastatin 40mg  Daily - Continue Eliquis - Stroke swallow screen - Echocardiogram  - A1C  - Lipid panel  - SLP eval - PT/OT  HTN, CHF, CAD EF 35-4-% in 2017, CAD medically managed. On Entresto, Coreg, Lasix, And Arlyce Harman at home. Significant other believed Lasix was stopped, but recent note says to continue both.  - Holding Entresto, Coreg, Lasix, And Spironolactone for Permissive HTN as above - Ins and Outs, Daily weights - Echo as above  Permanent A-fib: On Coreg and Eliquis - Continue  Eliquis - Holding Coreg  Cirrhosis, Thrombocytopenia: LFTs and INR WNL, Plts 53 (baseline unclear, 79 3 years ago).  - Follow Plts  COPD: On Trelegy. - PRN DUonebs  CKD III: Cr 1.95, possibly at baseline based on recent labs. - Follow renal function  GERD: Continue PPI  FEN: NPO pending swallow screen VTE ppx: Eliquis Code Status: FULL   Dispo: Admit patient to Inpatient with expected length of stay greater than 2 midnights.  Signed: Neva Seat, MD 03/02/2020, 9:25 AM  Pager: [phone] After 5pm on weekdays and 1pm on weekends: On Call pager: [phone]

## 2020-03-02 NOTE — ED Triage Notes (Signed)
Pt arrives from home via [facility] where he was found on the floor by his wife at approx 0500, pt did not recollect how he got there. Pt LSN 2130 last night when he went to bed. EMS reported LVO score of 2, pt presented with slurred speech, reported R sided facial droop and R sided weakness, no facial droop noted upon arrival. Pt a/o to person, place, disoriented to situation and time.

## 2020-03-02 NOTE — ED Notes (Signed)
Patient transported to MRI 

## 2020-03-03 DIAGNOSIS — I6340 Cerebral infarction due to embolism of unspecified cerebral artery: Secondary | ICD-10-CM

## 2020-03-03 LAB — RAPID URINE DRUG SCREEN, HOSP PERFORMED
Amphetamines: NOT DETECTED
Barbiturates: NOT DETECTED
Benzodiazepines: NOT DETECTED
Cocaine: POSITIVE — AB
Opiates: NOT DETECTED
Tetrahydrocannabinol: POSITIVE — AB

## 2020-03-03 LAB — BASIC METABOLIC PANEL
Anion gap: 6 (ref 5–15)
BUN: 19 mg/dL (ref 8–23)
CO2: 20 mmol/L — ABNORMAL LOW (ref 22–32)
Calcium: 8.3 mg/dL — ABNORMAL LOW (ref 8.9–10.3)
Chloride: 110 mmol/L (ref 98–111)
Creatinine, Ser: 1.42 mg/dL — ABNORMAL HIGH (ref 0.61–1.24)
GFR calc Af Amer: 58 mL/min — ABNORMAL LOW (ref >60)
GFR calc non Af Amer: 50 mL/min — ABNORMAL LOW (ref >60)
Glucose, Bld: 121 mg/dL — ABNORMAL HIGH (ref 70–99)
Potassium: 4.1 mmol/L (ref 3.5–5.1)
Sodium: 136 mmol/L (ref 135–145)

## 2020-03-03 LAB — URINALYSIS, ROUTINE W REFLEX MICROSCOPIC
Bacteria, UA: NONE SEEN
Bilirubin Urine: NEGATIVE
Glucose, UA: 50 mg/dL — AB
Ketones, ur: NEGATIVE mg/dL
Leukocytes,Ua: NEGATIVE
Nitrite: NEGATIVE
Protein, ur: 100 mg/dL — AB
Specific Gravity, Urine: 1.019 (ref 1.005–1.030)
pH: 5 (ref 5.0–8.0)

## 2020-03-03 LAB — CBC
HCT: 44.9 % (ref 39.0–52.0)
Hemoglobin: 14.9 g/dL (ref 13.0–17.0)
MCH: 33.9 pg (ref 26.0–34.0)
MCHC: 33.2 g/dL (ref 30.0–36.0)
MCV: 102.3 fL — ABNORMAL HIGH (ref 80.0–100.0)
Platelets: 63 10*3/uL — ABNORMAL LOW (ref 150–400)
RBC: 4.39 MIL/uL (ref 4.22–5.81)
RDW: 11.9 % (ref 11.5–15.5)
WBC: 7.2 10*3/uL (ref 4.0–10.5)
nRBC: 0 % (ref 0.0–0.2)

## 2020-03-03 MED ORDER — FLUTICASONE FUROATE-VILANTEROL 100-25 MCG/INH IN AEPB
1.0000 | INHALATION_SPRAY | Freq: Every day | RESPIRATORY_TRACT | Status: DC
  Administered 2020-03-04 – 2020-03-06 (×3): 1 via RESPIRATORY_TRACT
  Filled 2020-03-03: qty 28

## 2020-03-03 MED ORDER — UMECLIDINIUM BROMIDE 62.5 MCG/INH IN AEPB
1.0000 | INHALATION_SPRAY | Freq: Every day | RESPIRATORY_TRACT | Status: DC
  Administered 2020-03-04 – 2020-03-06 (×3): 1 via RESPIRATORY_TRACT
  Filled 2020-03-03: qty 7

## 2020-03-03 MED ORDER — FLUTICASONE-UMECLIDIN-VILANT 100-62.5-25 MCG/INH IN AEPB: 1.0000 | INHALATION_SPRAY | Freq: Every day | RESPIRATORY_TRACT | Status: DC

## 2020-03-03 NOTE — Progress Notes (Signed)
Per MD Permissive HTN.  Is Ok for this pt..  Continue to check VS q4hours as needed per MD.  Pt asymptomatic.  Pt denies SOB, CP, Headache, Dizziness.  Will continue to monitor Pt.

## 2020-03-03 NOTE — Progress Notes (Signed)
SLP Cancellation Note  Patient Details Name: Kevin [name] MRN: [id number] DOB: 1950-11-17   Cancelled treatment:       Reason Eval/Treat Not Completed: SLP screened, no needs identified, will sign off. Orders received for BSE. Pt passed RN [facility] swallow screen, and is tolerating current diet (regular/thin) without difficulty per RN.   Please reconsult if needs arise.  Suzette Flagler B. Murvin Natal, [facility], CCC-SLP Speech Language Pathologist Office: [phone] Pager: [phone]  Leigh Aurora 03/03/2020, 10:20 AM

## 2020-03-03 NOTE — Progress Notes (Signed)
Pts wife states that pt his biting tongue and having difficulty breathing.  Upon assessment pts o2 sat is 100% on R/A.  He is biting tongue and laughing he states that he doesn't know why he is biting his tongue.  RT called to administer PRN ned.  MD also notified.  Will continue to monitor pt.

## 2020-03-03 NOTE — Consult Note (Addendum)
Physical Medicine and Rehabilitation Consult Reason for Consult: Right side weakness with slurred speech Referring Physician: [facility]   HPI: Kevin [name] is a 69 y.o. right-handed male with history of hypertension, CKD, CAD, CHF, atrial fibrillation maintained on Eliquis, cocaine/alcohol/tobacco use.  Per chart review patient lives with spouse.  Independent with assistive device.  Two-level home bed and bath upstairs.  Presented 03/02/2020 with right side weakness and slurred speech as well as question seizure.  Cranial CT scan negative for acute findings.  Remote right occipital cortex infarct.  CT angiogram head and neck no emergent large vessel occlusion.  MRI showed numerous small acute cortical subcortical embolic type infarcts within the left cerebral hemisphere affecting the left ACA, MCA, PCA and watershed territories.  Multiple small acute infarcts also present within the left basal ganglia and left thalamus.  Echocardiogram with ejection fraction of 65% no wall motion abnormalities.  EEG negative for seizure.  Admission chemistries with glucose 197, BUN 27, creatinine 1.95, urine drug screen pending, alcohol negative.  Patient currently maintained on Eliquis as prior to admission.  Keppra added for seizure prophylaxis.  Tolerating a regular diet.  Therapy evaluations completed with recommendations of physical medicine rehab consult.   Review of Systems  Constitutional: Negative for chills and fever.  HENT: Negative for hearing loss.   Eyes: Negative for blurred vision and double vision.  Respiratory: Positive for shortness of breath. Negative for cough.   Cardiovascular: Positive for palpitations and leg swelling. Negative for chest pain.  Gastrointestinal: Positive for constipation. Negative for heartburn, nausea and vomiting.  Genitourinary: Negative for dysuria, flank pain and hematuria.  Musculoskeletal: Positive for joint pain and myalgias.  Skin: Negative for rash.    Neurological: Positive for weakness.  All other systems reviewed and are negative.  Past Medical History:  Diagnosis Date  . Atrial fibrillation, permanent (HCC) 02/01/2016  . Blood transfusion without reported diagnosis   . CAD in native artery cath 2016 90% mRamus  02/01/2016  . CHF (congestive heart failure) (HCC)   . Cholelithiases 05/01/2012  . Claudication (HCC)    bilateral  . Cocaine abuse (HCC)   . Dysrhythmia    afib  . ETOH abuse   . Hepatitis C antibody test positive 04/2012  . Hyperpigmentation   . Hypertension   . PAD (peripheral artery disease) (HCC)   . Persistent atrial fibrillation (HCC) 08/23/2014  . Thrombocytopenia (HCC)   . Tobacco abuse    Past Surgical History:  Procedure Laterality Date  . Cardiolite     negative for ischemia  . CARDIOVASCULAR STRESS TEST    . CARDIOVERSION  05/05/2012   Procedure: CARDIOVERSION;  Surgeon: Thurmon Fair, MD;  Location: [facility] ENDOSCOPY;  Service: Cardiovascular;  Laterality: N/A;  . CAROTID ARTERY ANGIOPLASTY    . DOPPLER ECHOCARDIOGRAPHY    . ENDOVASCULAR STENT INSERTION  right leg  . LEFT HEART CATHETERIZATION WITH CORONARY ANGIOGRAM N/A 05/01/2012   Procedure: LEFT HEART CATHETERIZATION WITH CORONARY ANGIOGRAM;  Surgeon: Marykay Lex, MD;  Location: [facility] CATH LAB;  Service: Cardiovascular;  Laterality: N/A;  . LEFT HEART CATHETERIZATION WITH CORONARY ANGIOGRAM N/A 09/27/2014   Procedure: LEFT HEART CATHETERIZATION WITH CORONARY ANGIOGRAM;  Surgeon: Chrystie Nose, MD;  Location: [facility] CATH LAB;  Service: Cardiovascular;  Laterality: N/A;  . TEE WITHOUT CARDIOVERSION  05/05/2012   Procedure: TRANSESOPHAGEAL ECHOCARDIOGRAM (TEE);  Surgeon: Thurmon Fair, MD;  Location: [facility] ENDOSCOPY;  Service: Cardiovascular;  Laterality: N/A;   Family History  Problem  Relation Age of Onset  . Seizures Daughter   . Cancer Mother    Social History:  reports that he has been smoking cigarettes. He has a 10.00 pack-year smoking history. He has  never used smokeless tobacco. He reports current alcohol use. He reports that he does not use drugs. Allergies: No Known Allergies Medications Prior to Admission  Medication Sig Dispense Refill  . albuterol (VENTOLIN HFA) 108 (90 Base) MCG/ACT inhaler Inhale 2 puffs into the lungs every 6 (six) hours as needed for wheezing or shortness of breath. 18 g 5  . apixaban (ELIQUIS) 5 MG TABS tablet Take 1 tablet (5 mg total) by mouth 2 (two) times daily. 180 tablet 3  . buPROPion (WELLBUTRIN SR) 150 MG 12 hr tablet TAKE 1 TABLET BY MOUTH TWICE A DAY (Patient taking differently: Take 150 mg by mouth 2 (two) times daily. ) 60 tablet 0  . carvedilol (COREG) 25 MG tablet TAKE 1 TABLET BY MOUTH 2 TIMES DAILY WITH A MEAL. (Patient taking differently: Take 25 mg by mouth 2 (two) times daily with a meal. ) 60 tablet 2  . folic acid (FOLVITE) 1 MG tablet Take 1 tablet (1 mg total) by mouth daily. Need appointment before anymore refills 30 tablet 1  . Multiple Vitamin (MULTIVITAMIN WITH MINERALS) TABS tablet Take 1 tablet by mouth daily. Men's One a Day    . rosuvastatin (CRESTOR) 20 MG tablet Take 20 mg by mouth at bedtime.    . sacubitril-valsartan (ENTRESTO) 97-103 MG Take 1 tablet by mouth 2 (two) times daily. 60 tablet 2  . spironolactone (ALDACTONE) 25 MG tablet Take 0.5 tablets (12.5 mg total) by mouth daily. 45 tablet 3  . TRELEGY ELLIPTA 100-62.5-25 MCG/INH AEPB Take 1 puff by mouth daily.    . furosemide (LASIX) 40 MG tablet Take 1 tablet (40 mg total) by mouth daily. (Patient not taking: Reported on 03/02/2020) 30 tablet 0  . levETIRAcetam (KEPPRA) 1000 MG tablet Take 1 tablet (1,000 mg total) by mouth 2 (two) times daily. (Patient not taking: Reported on 03/02/2020) 180 tablet 2  . pantoprazole (PROTONIX) 40 MG tablet Take 1 tablet (40 mg total) by mouth at bedtime. (Patient not taking: Reported on 03/02/2020) 90 tablet 3    Home: Home Living Family/patient expects to be discharged to:: Private  residence Living Arrangements: Spouse/significant other Available Help at Discharge: Family, Available PRN/intermittently Type of Home: House Home Access: Stairs to enter Home Layout: Two level, Bed/bath upstairs Alternate Level Stairs-Rails: Right Bathroom Shower/Tub: Engineer, manufacturing systems: Standard Bathroom Accessibility: Yes Home Equipment: Cane - single point Additional Comments: Patient was using cane prior to admission due to prior CVA  Functional History: Prior Function Level of Independence: Independent with assistive device(s) Comments: driving Functional Status:  Mobility: Bed Mobility Overal bed mobility: Needs Assistance Bed Mobility: Sit to Supine, Supine to Sit Supine to sit: Mod assist, +2 for physical assistance, +2 for safety/equipment Sit to supine: Mod assist, +2 for physical assistance, +2 for safety/equipment General bed mobility comments: patient required min assist to move R LE off bed, mod/max assist to raise trunk up to seated position. Transfers Overall transfer level: Needs assistance Equipment used: 2 person hand held assist Transfers: Sit to/from Stand Sit to Stand: +2 physical assistance, Mod assist General transfer comment: Patient unable to get fully standing at bedside x 2 attempts. R knee blocked and kept in optimal position to assist, but was unable to straighten it out all the way to support weight.  Ambulation/Gait General Gait Details: unable at this time    ADL: ADL Overall ADL's : Needs assistance/impaired Grooming: Minimal assistance, Bed level Upper Body Bathing: Sitting, Cueing for safety, Maximal assistance Upper Body Bathing Details (indicate cue type and reason): max A due to lack of ROM/strength in RUE and impaired sitting balance Lower Body Bathing: Maximal assistance, Bed level, Cueing for sequencing Upper Body Dressing : Cueing for sequencing, Sitting, Maximal assistance Lower Body Dressing: Sit to/from stand, Total  assistance, +2 for physical assistance Toilet Transfer Details (indicate cue type and reason): deferred due to safety Toileting- Clothing Manipulation and Hygiene: Cueing for safety, Cueing for sequencing, Sit to/from stand, Total assistance, +2 for physical assistance Tub/ Shower Transfer:  (deferred due to safety) Functional mobility during ADLs: Maximal assistance, +2 for physical assistance, +2 for safety/equipment General ADL Comments: Max A +2 due to R-side weakness, cognition, safety awareness, and balance  Cognition: Cognition Overall Cognitive Status: Impaired/Different from baseline Orientation Level: Oriented to person, Disoriented to place, Disoriented to time, Disoriented to situation Cognition Arousal/Alertness: Awake/alert Behavior During Therapy: WFL for tasks assessed/performed Overall Cognitive Status: Impaired/Different from baseline Area of Impairment: Orientation, Problem solving, Awareness, Following commands, Safety/judgement, Memory Orientation Level: Disoriented to, Time, Situation Current Attention Level: Sustained Memory: Decreased short-term memory Following Commands: Follows one step commands inconsistently, Follows one step commands with increased time Safety/Judgement: Decreased awareness of safety, Decreased awareness of deficits Awareness: Intellectual Problem Solving: Slow processing, Difficulty sequencing, Requires verbal cues, Requires tactile cues General Comments: Pt A&Ox2 and kept asking why his arm and leg didn't work after being told he had endured a stroke.  Requires cuing for safety. Poor awareness of deficits - pt says he can walk by himself.   Blood pressure (!) 216/105, pulse 100, temperature 98.1 F (36.7 C), temperature source Oral, resp. rate 18, height 6\' 1"  (1.854 m), weight 102.2 kg, SpO2 94 %. Physical Exam  Cardiovascular: Normal rate.  Respiratory: Effort normal.  Musculoskeletal:     Cervical back: Normal range of motion.    Neurological: He is alert.  Patient is alert in no acute distress.  Speech is a bit dysarthric but intelligible.  Follows commands.  Provides his name and age.  Limited medical historian. 0/5 RUE and RLE and grossly 4-5/5 LUE and LLE. Right inattention. Right central seven    Results for orders placed or performed during the hospital encounter of 03/02/20 (from the past 24 hour(s))  HIV Antibody (routine testing w rflx)     Status: None   Collection Time: 03/02/20  8:15 PM  Result Value Ref Range   HIV Screen 4th Generation wRfx Non Reactive Non Reactive  Basic metabolic panel     Status: Abnormal   Collection Time: 03/03/20  6:54 AM  Result Value Ref Range   Sodium 136 135 - 145 mmol/L   Potassium 4.1 3.5 - 5.1 mmol/L   Chloride 110 98 - 111 mmol/L   CO2 20 (L) 22 - 32 mmol/L   Glucose, Bld 121 (H) 70 - 99 mg/dL   BUN 19 8 - 23 mg/dL   Creatinine, Ser 05/03/20 (H) 0.61 - 1.24 mg/dL   Calcium 8.3 (L) 8.9 - 10.3 mg/dL   GFR calc non Af Amer 50 (L) >60 mL/min   GFR calc Af Amer 58 (L) >60 mL/min   Anion gap 6 5 - 15  CBC     Status: Abnormal   Collection Time: 03/03/20  6:54 AM  Result Value Ref Range  WBC 7.2 4.0 - 10.5 K/uL   RBC 4.39 4.22 - 5.81 MIL/uL   Hemoglobin 14.9 13.0 - 17.0 g/dL   HCT 44.9 39 - 52 %   MCV 102.3 (H) 80.0 - 100.0 fL   MCH 33.9 26.0 - 34.0 pg   MCHC 33.2 30.0 - 36.0 g/dL   RDW 11.9 11.5 - 15.5 %   Platelets 63 (L) 150 - 400 K/uL   nRBC 0.0 0.0 - 0.2 %   EEG  Result Date: 03/02/2020 Lora Havens, MD     03/02/2020 12:24 PM Patient Name: Kevin [name] MRN: [id number] Epilepsy Attending: Lora Havens Referring Physician/Provider: Dr. Karena Addison Aroor Date: 03/02/2020 Duration: 23.22 minutes Patient history: 69 year old male with history of seizures as well as stroke who presented with right-sided weakness.  EEG evaluate for seizures. Level of alertness: Awake, drowsy, sleep, comatose, lethargic AEDs during EEG study: Keppra Technical aspects: This EEG  study was done with scalp electrodes positioned according to the 10-20 International system of electrode placement. Electrical activity was acquired at a sampling rate of 500Hz  and reviewed with a high frequency filter of 70Hz  and a low frequency filter of 1Hz . EEG data were recorded continuously and digitally stored. Description: The posterior dominant rhythm consists of 8-9 Hz activity of moderate voltage (25-35 uV) seen predominantly in posterior head regions, symmetric and reactive to eye opening and eye closing. EEG showed intermittent generalized and lateralized 3 to 6 Hz theta-delta slowing. Hyperventilation and photic stimulation were not performed.   ABNORMALITY -Intermittent slow, generalized and lateralized left hemisphere IMPRESSION: This study is suggestive of nonspecific cortical dysfunction in left hemisphere as well as mild diffuse encephalopathy, nonspecific to etiology. No seizures or epileptiform discharges were seen throughout the recording. Priyanka Barbra Sarks   CT ANGIO HEAD W OR WO CONTRAST  Result Date: 03/02/2020 CLINICAL DATA:  Stroke EXAM: CT ANGIOGRAPHY HEAD AND NECK CT PERFUSION BRAIN TECHNIQUE: Multidetector CT imaging of the head and neck was performed using the standard protocol during bolus administration of intravenous contrast. Multiplanar CT image reconstructions and MIPs were obtained to evaluate the vascular anatomy. Carotid stenosis measurements (when applicable) are obtained utilizing NASCET criteria, using the distal internal carotid diameter as the denominator. Multiphase CT imaging of the brain was performed following IV bolus contrast injection. Subsequent parametric perfusion maps were calculated using RAPID software. CONTRAST:  175mL OMNIPAQUE IOHEXOL 350 MG/ML SOLN COMPARISON:  Neck CTA 08/26/2015 FINDINGS: CTA NECK FINDINGS Aortic arch: Atherosclerotic plaque. No acute finding. Three vessel branching Right carotid system: Moderate mixed density plaque at the  bifurcation. At the distal margin of the bulb is an anterior wall primarily calcified plaque which causes 30% narrowing. Left carotid system: Mixed density plaque at the bifurcation without flow reducing stenosis or ulceration. Vertebral arteries: No proximal subclavian stenosis or atherosclerosis. Calcified plaque at both V4 segments with assessment the left limited by intravenous contrast. There is likely high-grade left V1 segment stenosis. Otherwise the vertebral arteries are widely patent to the dura. Skeleton: Lower cervical spondylosis Other neck: No acute finding Upper chest: No acute finding Review of the MIP images confirms the above findings CTA HEAD FINDINGS Anterior circulation: Diffusely calcified carotid siphons. No branch occlusion, flow limiting stenosis, or beading. Atheromatous changes seen to the bilateral M1 and downstream branches. Negative for aneurysm. Posterior circulation: The vertebral and basilar arteries are widely patent. Atheromatous irregularity to the bilateral posterior cerebral arteries with up to moderate narrowing at the right P4 level and at the left  P2 segment. Negative for aneurysm Venous sinuses: Unremarkable in the arterial phase Anatomic variants: None significant Review of the MIP images confirms the above findings CT Brain Perfusion Findings: ASPECTS: 10 CBF (<30%) Volume: 0mL Perfusion (Tmax>6.0s) volume: 3mL in the right cerebral white matter-possibly noise given the setting of right-sided weakness. IMPRESSION: 1. No emergent large vessel occlusion. 2. Cervical carotid atherosclerosis without flow reducing stenosis or ulceration. 3. Atherosclerosis and potentially high-grade narrowing of the left V1 segment with assessment limited by artifact from intravenous contrast. 4. Intracranial atherosclerosis without flow reducing stenosis and major vessels. Electronically Signed   By: Marnee Spring M.D.   On: 03/02/2020 06:21   CT ANGIO NECK W OR WO CONTRAST  Result Date:  03/02/2020 CLINICAL DATA:  Stroke EXAM: CT ANGIOGRAPHY HEAD AND NECK CT PERFUSION BRAIN TECHNIQUE: Multidetector CT imaging of the head and neck was performed using the standard protocol during bolus administration of intravenous contrast. Multiplanar CT image reconstructions and MIPs were obtained to evaluate the vascular anatomy. Carotid stenosis measurements (when applicable) are obtained utilizing NASCET criteria, using the distal internal carotid diameter as the denominator. Multiphase CT imaging of the brain was performed following IV bolus contrast injection. Subsequent parametric perfusion maps were calculated using RAPID software. CONTRAST:  OMNIPAQUE IOHEXOL 350 MG/ML SOLN COMPARISON:  Neck CTA 08/26/2015 FINDINGS: CTA NECK FINDINGS Aortic arch: Atherosclerotic plaque. No acute finding. Three vessel branching Right carotid system: Moderate mixed density plaque at the bifurcation. At the distal margin of the bulb is an anterior wall primarily calcified plaque which causes 30% narrowing. Left carotid system: Mixed density plaque at the bifurcation without flow reducing stenosis or ulceration. Vertebral arteries: No proximal subclavian stenosis or atherosclerosis. Calcified plaque at both V4 segments with assessment the left limited by intravenous contrast. There is likely high-grade left V1 segment stenosis. Otherwise the vertebral arteries are widely patent to the dura. Skeleton: Lower cervical spondylosis Other neck: No acute finding Upper chest: No acute finding Review of the MIP images confirms the above findings CTA HEAD FINDINGS Anterior circulation: Diffusely calcified carotid siphons. No branch occlusion, flow limiting stenosis, or beading. Atheromatous changes seen to the bilateral M1 and downstream branches. Negative for aneurysm. Posterior circulation: The vertebral and basilar arteries are widely patent. Atheromatous irregularity to the bilateral posterior cerebral arteries with up to  moderate narrowing at the right P4 level and at the left P2 segment. Negative for aneurysm Venous sinuses: Unremarkable in the arterial phase Anatomic variants: None significant Review of the MIP images confirms the above findings CT Brain Perfusion Findings: ASPECTS: 10 CBF (<30%) Volume: 0mL Perfusion (Tmax>6.0s) volume: 3mL in the right cerebral white matter-possibly noise given the setting of right-sided weakness. IMPRESSION: 1. No emergent large vessel occlusion. 2. Cervical carotid atherosclerosis without flow reducing stenosis or ulceration. 3. Atherosclerosis and potentially high-grade narrowing of the left V1 segment with assessment limited by artifact from intravenous contrast. 4. Intracranial atherosclerosis without flow reducing stenosis and major vessels. Electronically Signed   By: Marnee Spring M.D.   On: 03/02/2020 06:21   MR BRAIN WO CONTRAST  Result Date: 03/02/2020 CLINICAL DATA:  Focal neuro deficit, greater than 6 hours, stroke suspected. Additional history provided: History of hypertension, CKD, CAD, CHF, atrial fibrillation, P 80, cocaine use, thrombocytopenia, cirrhosis, CVA, seizures and GERD, presenting with right-sided weakness, facial droop and slurred speech. EXAM: MRI HEAD WITHOUT CONTRAST TECHNIQUE: Multiplanar, multiecho pulse sequences of the brain and surrounding structures were obtained without intravenous contrast. COMPARISON:  Noncontrast head CT,  CT angiogram head/neck and CT perfusion performed earlier the same day 03/02/2020, brain MRI 01/31/2016 FINDINGS: Brain: Mild intermittent motion degradation. There are numerous small embolic type acute cortical/subcortical infarcts within the left cerebral hemisphere predominantly affecting the left ACA, MCA and watershed territories. To a lesser degree, there are multiple acute cortical/subcortical infarcts within the left parietooccipital lobe in the left PCA territory. There also multiple small acute infarcts within the left  basal ganglia and thalami. Corresponding T2/FLAIR hyperintensity at these sites. Redemonstrated chronic cortically based right occipital lobe infarct. Background moderate/advanced patchy T2/FLAIR hyperintensity within the cerebral white matter which is nonspecific, but consistent with chronic small vessel ischemic disease. To a lesser degree, chronic small vessel ischemic changes are present within the pons. Chronic lacunar infarcts within the deep gray nuclei are more numerous as compared to MRI 01/31/2016. There is also a chronic infarct within the paramedian right pons which was not present on this prior examination. Nonspecific chronic microhemorrhage within the right thalamus. Stable, moderate generalized parenchymal atrophy. No evidence of intracranial mass. No extra-axial fluid collection. No midline shift. Vascular: Expected proximal arterial flow voids. Skull and upper cervical spine: No focal marrow lesion. Sinuses/Orbits: Visualized orbits show no acute finding. Mild paranasal sinus mucosal thickening, most notably ethmoid. No significant mastoid effusion. IMPRESSION: Numerous small acute cortical/subcortical embolic-type infarcts within the left cerebral hemisphere affecting the left ACA, MCA, PCA and watershed territories. Multiple small acute infarcts are also present within the left basal ganglia and left thalamus. Redemonstrated chronic cortically based right occipital lobe infarct. Background moderate to advanced chronic small vessel ischemic disease. Multiple chronic lacunar infarcts within the deep gray nuclei have increased in number as compared to MRI 01/31/2016. Additionally, there is a chronic infarct within the right pons which was not present on this prior examination. Moderate generalized parenchymal atrophy. Mild paranasal sinus mucosal thickening. Electronically Signed   By: Jackey Loge DO   On: 03/02/2020 14:39   CT CEREBRAL PERFUSION W CONTRAST  Result Date: 03/02/2020 CLINICAL  DATA:  Stroke EXAM: CT ANGIOGRAPHY HEAD AND NECK CT PERFUSION BRAIN TECHNIQUE: Multidetector CT imaging of the head and neck was performed using the standard protocol during bolus administration of intravenous contrast. Multiplanar CT image reconstructions and MIPs were obtained to evaluate the vascular anatomy. Carotid stenosis measurements (when applicable) are obtained utilizing NASCET criteria, using the distal internal carotid diameter as the denominator. Multiphase CT imaging of the brain was performed following IV bolus contrast injection. Subsequent parametric perfusion maps were calculated using RAPID software. CONTRAST:  OMNIPAQUE IOHEXOL 350 MG/ML SOLN COMPARISON:  Neck CTA 08/26/2015 FINDINGS: CTA NECK FINDINGS Aortic arch: Atherosclerotic plaque. No acute finding. Three vessel branching Right carotid system: Moderate mixed density plaque at the bifurcation. At the distal margin of the bulb is an anterior wall primarily calcified plaque which causes 30% narrowing. Left carotid system: Mixed density plaque at the bifurcation without flow reducing stenosis or ulceration. Vertebral arteries: No proximal subclavian stenosis or atherosclerosis. Calcified plaque at both V4 segments with assessment the left limited by intravenous contrast. There is likely high-grade left V1 segment stenosis. Otherwise the vertebral arteries are widely patent to the dura. Skeleton: Lower cervical spondylosis Other neck: No acute finding Upper chest: No acute finding Review of the MIP images confirms the above findings CTA HEAD FINDINGS Anterior circulation: Diffusely calcified carotid siphons. No branch occlusion, flow limiting stenosis, or beading. Atheromatous changes seen to the bilateral M1 and downstream branches. Negative for aneurysm. Posterior circulation: The vertebral and  basilar arteries are widely patent. Atheromatous irregularity to the bilateral posterior cerebral arteries with up to moderate narrowing at the  right P4 level and at the left P2 segment. Negative for aneurysm Venous sinuses: Unremarkable in the arterial phase Anatomic variants: None significant Review of the MIP images confirms the above findings CT Brain Perfusion Findings: ASPECTS: 10 CBF (<30%) Volume: 0mL Perfusion (Tmax>6.0s) volume: 3mL in the right cerebral white matter-possibly noise given the setting of right-sided weakness. IMPRESSION: 1. No emergent large vessel occlusion. 2. Cervical carotid atherosclerosis without flow reducing stenosis or ulceration. 3. Atherosclerosis and potentially high-grade narrowing of the left V1 segment with assessment limited by artifact from intravenous contrast. 4. Intracranial atherosclerosis without flow reducing stenosis and major vessels. Electronically Signed   By: Marnee Spring M.D.   On: 03/02/2020 06:21   ECHOCARDIOGRAM COMPLETE  Result Date: 03/02/2020    ECHOCARDIOGRAM REPORT   Patient Name:   Kevin [name] Date of Exam: 03/02/2020 Medical Rec #:  [id number]         Height:       73.0 in Accession #:    [id number]        Weight:       227.8 lb Date of Birth:  [birth year]         BSA:          2.274 m Patient Age:    69 years          BP:           139/110 mmHg Patient Gender: M                 HR:           74 bpm. Exam Location:  Inpatient Procedure: 2D Echo and Intracardiac Opacification Agent Indications:    Stroke 434.91 / I163.9                 Congestive Heart Failure 428.0 / I50.9  History:        Patient has prior history of Echocardiogram examinations, most                 recent 03/06/2016. CAD, Stroke and PAD, Arrythmias:Atrial                 Fibrillation; Risk Factors:Hypertension. Chronic kidney disease,                 Cocaine use, Thrombocytopenia, Cirrhosis, GERD.  Sonographer:    Leta Jungling RDCS Referring Phys: [id number] NISCHAL NARENDRA IMPRESSIONS  1. Left ventricular ejection fraction, by estimation, is 60 to 65%. The left ventricle has normal function. The left ventricle has  no regional wall motion abnormalities. There is severe concentric left ventricular hypertrophy. Left ventricular diastolic  parameters are indeterminate. Elevated left ventricular end-diastolic pressure.  2. Right ventricular systolic function is normal. The right ventricular size is normal. There is normal pulmonary artery systolic pressure.  3. Left atrial size was severely dilated.  4. The mitral valve is normal in structure. Trivial mitral valve regurgitation. No evidence of mitral stenosis.  5. The aortic valve is normal in structure. Aortic valve regurgitation is not visualized. No aortic stenosis is present.  6. The inferior vena cava is normal in size with greater than 50% respiratory variability, suggesting right atrial pressure of 3 mmHg. FINDINGS  Left Ventricle: Left ventricular ejection fraction, by estimation, is 60 to 65%. The left ventricle has normal function. The left ventricle has no regional wall motion abnormalities.  Definity contrast agent was given IV to delineate the left ventricular  endocardial borders. The left ventricular internal cavity size was normal in size. There is severe concentric left ventricular hypertrophy. Left ventricular diastolic parameters are indeterminate. Elevated left ventricular end-diastolic pressure. Right Ventricle: The right ventricular size is normal. No increase in right ventricular wall thickness. Right ventricular systolic function is normal. There is normal pulmonary artery systolic pressure. The tricuspid regurgitant velocity is 1.63 m/s, and  with an assumed right atrial pressure of 3 mmHg, the estimated right ventricular systolic pressure is 13.6 mmHg. Left Atrium: Left atrial size was severely dilated. Right Atrium: Right atrial size was normal in size. Pericardium: There is no evidence of pericardial effusion. Mitral Valve: The mitral valve is normal in structure. Normal mobility of the mitral valve leaflets. Trivial mitral valve regurgitation. No  evidence of mitral valve stenosis. Tricuspid Valve: The tricuspid valve is normal in structure. Tricuspid valve regurgitation is trivial. No evidence of tricuspid stenosis. Aortic Valve: The aortic valve is normal in structure.. There is mild thickening and mild calcification of the aortic valve. Aortic valve regurgitation is not visualized. No aortic stenosis is present. There is mild thickening of the aortic valve. There is mild calcification of the aortic valve. Pulmonic Valve: The pulmonic valve was normal in structure. Pulmonic valve regurgitation is not visualized. No evidence of pulmonic stenosis. Aorta: The aortic root is normal in size and structure. Venous: The inferior vena cava is normal in size with greater than 50% respiratory variability, suggesting right atrial pressure of 3 mmHg. IAS/Shunts: No atrial level shunt detected by color flow Doppler.  LEFT VENTRICLE PLAX 2D LVIDd:         3.66 cm     Diastology LVIDs:         2.29 cm     LV e' lateral:   6.60 cm/s LV PW:         1.59 cm     LV E/e' lateral: 12.1 LV IVS:        1.83 cm     LV e' medial:    4.76 cm/s LVOT diam:     2.40 cm     LV E/e' medial:  16.8 LV SV:         44 LV SV Index:   19 LVOT Area:     4.52 cm  LV Volumes (MOD) LV vol d, MOD A2C: 52.9 ml LV vol d, MOD A4C: 64.7 ml LV vol s, MOD A2C: 28.5 ml LV vol s, MOD A4C: 35.8 ml LV SV MOD A2C:     24.4 ml LV SV MOD A4C:     64.7 ml LV SV MOD BP:      26.4 ml LEFT ATRIUM              Index LA diam:        4.30 cm  1.89 cm/m LA Vol (A2C):   50.9 ml  22.38 ml/m LA Vol (A4C):   103.0 ml 45.29 ml/m LA Biplane Vol: 78.3 ml  34.43 ml/m  AORTIC VALVE LVOT Vmax:   64.95 cm/s LVOT Vmean:  45.250 cm/s LVOT VTI:    0.097 m  AORTA Ao Root diam: 3.10 cm MITRAL VALVE               TRICUSPID VALVE MV Area (PHT): 5.97 cm    TR Peak grad:   10.6 mmHg MV Decel Time: 127 msec    TR Vmax:  163.00 cm/s MV E velocity: 79.95 cm/s                            SHUNTS                            Systemic  VTI:  0.10 m                            Systemic Diam: 2.40 cm Chilton Si MD Electronically signed by Chilton Si MD Signature Date/Time: 03/02/2020/2:53:05 PM    Final    CT HEAD CODE STROKE WO CONTRAST  Result Date: 03/02/2020 CLINICAL DATA:  Code stroke. Right-sided weakness and slurred speech EXAM: CT HEAD WITHOUT CONTRAST TECHNIQUE: Contiguous axial images were obtained from the base of the skull through the vertex without intravenous contrast. COMPARISON:  02/04/2016 FINDINGS: Brain: No evidence of acute infarction, hemorrhage, hydrocephalus, extra-axial collection or mass lesion/mass effect. Moderate remote right occipital infarct, cortically based. Small remote appearing bilateral deep gray nuclei infarctions. Chronic small vessel ischemia in the cerebral white matter. Vascular: Atherosclerotic calcification.  No hyperdense vessel. Skull: Enlarged by parietal foramina Sinuses/Orbits: Negative Other: These results were communicated to Dr. Laurence Slate at 5:58 amon 6/10/2021by text page via the [facility] messaging system. ASPECTS [facility] Stroke Program Early CT Score) - Ganglionic level infarction (caudate, lentiform nuclei, internal capsule, insula, M1-M3 cortex): 7 - Supraganglionic infarction (M4-M6 cortex): 3 Total score (0-10 with 10 being normal): 10 IMPRESSION: 1. No acute finding 2. Chronic small vessel ischemia with chronic appearing lacunar infarcts. 3. Remote right occipital cortex infarct. Electronically Signed   By: Marnee Spring M.D.   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 Does the need for close, 24 hr/day medical supervision in concert with the patient's rehab needs make it unreasonable for this patient to be served in a less intensive setting? Yes 2. Co-Morbidities requiring supervision/potential complications: CKD, CAD, CHF, afib 3. Due to bladder management, bowel management, safety, skin/wound care, disease  management, medication administration, pain management and patient education, does the patient require 24 hr/day rehab nursing? Yes 4. Does the patient require coordinated care of a physician, rehab nurse, therapy disciplines of PT, OT, SLP to address physical and functional deficits in the context of the above medical diagnosis(es)? Yes Addressing deficits in the following areas: balance, endurance, locomotion, strength, transferring, bowel/bladder control, bathing, dressing, feeding, grooming, toileting, cognition, speech, swallowing and psychosocial support 5. Can the patient actively participate in an intensive therapy program of at least 3 hrs of therapy per day at least 5 days per week? Yes 6. The potential for patient to make measurable gains while on inpatient rehab is excellent 7. Anticipated functional outcomes upon discharge from inpatient rehab are min assist and mod assist  with PT, min assist and mod assist with OT, supervision with SLP. 8. Estimated rehab length of stay to reach the above functional goals is: 21-28 days 9. Anticipated discharge destination: Home 10. Overall Rehab/Functional Prognosis: excellent  RECOMMENDATIONS: This patient's condition is appropriate for continued rehabilitative care in the following setting: CIR Patient has agreed to participate in recommended program. Yes Note that insurance prior authorization may be required for reimbursement for recommended care.  Comment: Rehab Admissions Coordinator to follow up.  Thanks,  Ranelle Oyster, MD, FAAPMR  I have examined pertinent  labs and radiographic images. I have reviewed and concur with the physician assistant's documentation above.    Mcarthur Rossetti Angiulli, PA-C 03/03/2020

## 2020-03-03 NOTE — Progress Notes (Signed)
MD notified of this pts BP of 201/142.  This pt in in no distress and denies pain, sob.  Per MD permissive HTN range is typically 220/120 but he will tolerate dystolic in 140s.  Will continue to monitor pt.

## 2020-03-03 NOTE — Evaluation (Signed)
Physical Therapy Evaluation Patient Details Name: Kevin [name] MRN: [id number] DOB: 13-Jun-1951 Today's Date: 03/03/2020   History of Present Illness  Kevin [name] is a 70 yo M with Hx of HTN, CKD, CAD, CHF, A-fib, PAD, Cocaine use, Thrombocytopenia, Cirrhosis, CVA, Seizures, and GERD who presented to the ED after waking up in the morning with right sided weakness, facial droop and slurred speech. He was found on the floor by his wife at around 530 am.  Clinical Impression  Patient received in bed, wife present at bedside. Patient agrees to PT session. He reports pain in R LE. Unable to elaborate. He is alert, has some disorientation to time and situation. Short term memory deficits. He requires mod +2 assist for supine><sit. Attempted standing at bedside x 2 reps with +2 mod/max assist. Unable to get fully upright. Right LE unable to support his weight. He will continue to benefit from skilled PT while here to improve R LE strength, functional mobility, and safety with mobility. Requires cues and assist for safety at this time.       Follow Up Recommendations CIR    Equipment Recommendations  Other (comment) (TBD)    Recommendations for Other Services Rehab consult     Precautions / Restrictions Precautions Precautions: Fall Restrictions Weight Bearing Restrictions: No      Mobility  Bed Mobility Overal bed mobility: Needs Assistance Bed Mobility: Sit to Supine;Supine to Sit     Supine to sit: Mod assist;+2 for physical assistance;+2 for safety/equipment Sit to supine: Mod assist;+2 for physical assistance;+2 for safety/equipment   General bed mobility comments: patient required min assist to move R LE off bed, mod/max assist to raise trunk up to seated position.  Transfers Overall transfer level: Needs assistance Equipment used: 2 person hand held assist Transfers: Sit to/from Stand Sit to Stand: +2 physical assistance;Mod assist         General transfer comment:  Patient unable to get fully standing at bedside x 2 attempts. R knee blocked and kept in optimal position to assist, but was unable to straighten it out all the way to support weight.  Ambulation/Gait             General Gait Details: unable at this time  Stairs            Wheelchair Mobility    Modified Rankin (Stroke Patients Only) Modified Rankin (Stroke Patients Only) Pre-Morbid Rankin Score: No significant disability Modified Rankin: Moderately severe disability     Balance Overall balance assessment: Needs assistance Sitting-balance support: Single extremity supported;Feet supported Sitting balance-Kevin [name] Scale: Poor Sitting balance - Comments: patient leaning forward or to his right with sitting up on side of bed. Requires cues and min guard for safety and to get upright. Postural control: Other (comment) (right or anterior lean) Standing balance support: Bilateral upper extremity supported;During functional activity Standing balance-Kevin [name] Scale: Poor Standing balance comment: unable to get fully standing. Requires +2 mod/max assist                             Pertinent Vitals/Pain Pain Assessment: Faces Faces Pain Scale: Hurts a little bit Pain Location: R LE Pain Descriptors / Indicators: Discomfort Pain Intervention(s): Monitored during session    Home Living Family/patient expects to be discharged to:: Private residence Living Arrangements: Spouse/significant other Available Help at Discharge: Family;Available PRN/intermittently Type of Home: House Home Access: Stairs to enter     Home Layout: Two level;Bed/bath  upstairs Home Equipment: Cane - single point Additional Comments: Patient was using cane prior to admission due to prior CVA    Prior Function Level of Independence: Independent with assistive device(s)         Comments: driving     Hand Dominance   Dominant Hand: Left    Extremity/Trunk Assessment   Upper Extremity  Assessment Upper Extremity Assessment: Defer to OT evaluation    Lower Extremity Assessment Lower Extremity Assessment: RLE deficits/detail RLE Deficits / Details: Patient demonstrates grossly 2+/5 strength in R LE. Unable to hold against gravity, reports decreased sensation RLE Sensation: decreased light touch RLE Coordination: decreased gross motor    Cervical / Trunk Assessment Cervical / Trunk Assessment: Normal  Communication   Communication: Expressive difficulties;Other (comment) (at times he is slow to respond, answers incorrectly. Wife present to confirm)  Cognition Arousal/Alertness: Awake/alert Behavior During Therapy: WFL for tasks assessed/performed Overall Cognitive Status: Impaired/Different from baseline Area of Impairment: Orientation;Problem solving;Awareness;Following commands;Safety/judgement;Memory                 Orientation Level: Disoriented to;Time;Situation   Memory: Decreased short-term memory Following Commands: Follows one step commands inconsistently;Follows one step commands with increased time Safety/Judgement: Decreased awareness of safety;Decreased awareness of deficits Awareness: Intellectual Problem Solving: Slow processing;Difficulty sequencing;Requires verbal cues;Requires tactile cues        General Comments      Exercises     Assessment/Plan    PT Assessment Patient needs continued PT services  PT Problem List Decreased strength;Decreased mobility;Decreased safety awareness;Decreased activity tolerance;Decreased balance;Decreased knowledge of precautions;Impaired sensation;Decreased coordination       PT Treatment Interventions DME instruction;Therapeutic activities;Gait training;Therapeutic exercise;Patient/family education;Functional mobility training;Neuromuscular re-education;Balance training    PT Goals (Current goals can be found in the Care Plan section)  Acute Rehab PT Goals Patient Stated Goal: to improve use of  Right side PT Goal Formulation: With patient/family Time For Goal Achievement: 03/17/20 Potential to Achieve Goals: Fair    Frequency Min 4X/week   Barriers to discharge Decreased caregiver support wife reports she works    Co-evaluation               AM-PAC PT "6 Clicks" Mobility  Outcome Measure Help needed turning from your back to your side while in a flat bed without using bedrails?: A Lot Help needed moving from lying on your back to sitting on the side of a flat bed without using bedrails?: A Lot Help needed moving to and from a bed to a chair (including a wheelchair)?: Total Help needed standing up from a chair using your arms (e.g., wheelchair or bedside chair)?: A Lot Help needed to walk in hospital room?: Total Help needed climbing 3-5 steps with a railing? : Total 6 Click Score: 9    End of Session Equipment Utilized During Treatment: Gait belt Activity Tolerance: Patient tolerated treatment well;Other (comment) (limited by right hemiplegia) Patient left: in bed;with bed alarm set;with family/visitor present;with call bell/phone within reach Nurse Communication: Mobility status PT Visit Diagnosis: Other abnormalities of gait and mobility (R26.89);Muscle weakness (generalized) (M62.81);Hemiplegia and hemiparesis Hemiplegia - Right/Left: Right Hemiplegia - dominant/non-dominant: Non-dominant Hemiplegia - caused by: Cerebral infarction    Time: 3291-9166 PT Time Calculation (min) (ACUTE ONLY): 33 min   Charges:   PT Evaluation $PT Eval Moderate Complexity: 1 Mod PT Treatments $Therapeutic Activity: 8-22 mins        Miray Mancino, PT, GCS 03/03/20,12:31 PM

## 2020-03-03 NOTE — Progress Notes (Signed)
HD#1 Subjective:  Overnight Events: Patient admitted overnight.   Kevin [name] was seen this morning on rounds.  Patient was resting comfortably in bed.  [name] was present at bedside.  He states that he has made some improvements overnights particularly with his speech.  He continues to have significant expressive aphasia and possible receptive aphasia.  Difficult to determine if he has any neglect but does experience significant right-sided weakness upper and lower extremities.  Objective:  Vital signs in last 24 hours: Vitals:   03/02/20 2300 03/02/20 2340 03/03/20 0310 03/03/20 0745  BP:  (!) 207/165 (!) 194/125 (!) 201/142  Pulse:  86  96  Resp:  15 16 20   Temp: 97.9 F (36.6 C) 97.8 F (36.6 C) 98 F (36.7 C) 97.7 F (36.5 C)  TempSrc:  Oral Oral Oral  SpO2:  98% 99% 98%  Weight:      Height:       Supplemental O2: RA SpO2: 98 %   Physical Exam:  Physical Exam Constitutional:      Appearance: Normal appearance.  HENT:     Head: Normocephalic and atraumatic.  Eyes:     Extraocular Movements: Extraocular movements intact.  Cardiovascular:     Rate and Rhythm: Normal rate.     Pulses: Normal pulses.     Heart sounds: Normal heart sounds.  Pulmonary:     Effort: Pulmonary effort is normal.     Breath sounds: Normal breath sounds.  Abdominal:     General: Bowel sounds are normal.     Palpations: Abdomen is soft.     Tenderness: There is no abdominal tenderness.  Musculoskeletal:        General: Normal range of motion.     Cervical back: Normal range of motion.     Right lower leg: No edema.     Left lower leg: No edema.  Skin:    General: Skin is warm and dry.  Neurological:     Mental Status: He is alert and oriented to person, place, and time. Mental status is at baseline. He is confused.     Cranial Nerves: No facial asymmetry.     Sensory: Sensation is intact.     Motor: Weakness present.     Comments: RUE: 2/5  RLE: 2/5  Difficult  to determine sensation, but like decreased sensation and paresthesias on the right upper and lower extremity. Expressive aphasia with possible receptive component  Psychiatric:        Mood and Affect: Mood normal.     Filed Weights   03/02/20 1955  Weight: 102.2 kg     Intake/Output Summary (Last 24 hours) at 03/03/2020 1016 Last data filed at 03/02/2020 2300 Gross per 24 hour  Intake --  Output 900 ml  Net -900 ml   Net IO Since Admission: -900 mL [03/03/20 1016]  Pertinent Labs: CBC Latest Ref Rng & Units 03/03/2020 03/02/2020 03/02/2020  WBC 4.0 - 10.5 K/uL 7.2 - 8.7  Hemoglobin 13.0 - 17.0 g/dL 05/02/2020 31.4 97.0  Hematocrit 39 - 52 % 44.9 44.0 45.6  Platelets 150 - 400 K/uL 63(L) - 53(L)    CMP Latest Ref Rng & Units 03/03/2020 03/02/2020 03/02/2020  Glucose 70 - 99 mg/dL 05/02/2020) 785(Y) 850(Y)  BUN 8 - 23 mg/dL 19 774(J) 28(N)  Creatinine 0.61 - 1.24 mg/dL 86(V) 6.72(C) 9.47(S)  Sodium 135 - 145 mmol/L 136 139 136  Potassium 3.5 - 5.1 mmol/L 4.1 4.2 4.0  Chloride  98 - 111 mmol/L 110 109 107  CO2 22 - 32 mmol/L 20(L) - 17(L)  Calcium 8.9 - 10.3 mg/dL 8.3(L) - 8.6(L)  Total Protein 6.5 - 8.1 g/dL - - 6.6  Total Bilirubin 0.3 - 1.2 mg/dL - - 1.2  Alkaline Phos 38 - 126 U/L - - 78  AST 15 - 41 U/L - - 28  ALT 0 - 44 U/L - - 26    Pending Labs:   Imaging:  MR Brain wo: Numerous small acute cortical/subcortical embolic-type infarcts within the left cerebral hemisphere affecting the left ACA, MCA, PCA and watershed territories. Multiple small acute infarcts are also present within the left basal ganglia and left thalamus. Redemonstrated chronic cortically based right occipital lobe infarct. Background moderate to advanced chronic small vessel ischemic disease. Multiple chronic lacunar infarcts within the deep gray nuclei have increased in number as compared to MRI 01/31/2016. Additionally, there is a chronic infarct within the right pons which was not present on this prior  examination. Moderate generalized parenchymal atrophy. Mild paranasal sinus mucosal thickening.  Echocardiogram: 1. Left ventricular ejection fraction, by estimation, is 60 to 65%. The left ventricle has normal function. The left ventricle has no regional wall motion abnormalities. There is severe concentric left ventricular hypertrophy. Left ventricular diastolic  parameters are indeterminate. Elevated left ventricular end-diastolic pressure.   2. Right ventricular systolic function is normal. The right ventricular size is normal. There is normal pulmonary artery systolic pressure.   3. Left atrial size was severely dilated.   4. The mitral valve is normal in structure. Trivial mitral valve regurgitation. No evidence of mitral stenosis.   5. The aortic valve is normal in structure. Aortic valve regurgitation is not visualized. No aortic stenosis is present.   6. The inferior vena cava is normal in size with greater than 50% respiratory variability, suggesting right atrial pressure of 3 mmHg.   Assessment/Plan:   Active Problems:   Acute focal neurological deficit    has a past medical history of Atrial fibrillation, permanent ([place]) (02/01/2016), Blood transfusion without reported diagnosis, CAD in native artery cath 2016 90% m[place]amus  (02/01/2016), CHF (congestive heart failure) ([place]), Cholelithiases (05/01/2012), Claudication ([place]), Cocaine abuse ([place]), Dysrhythmia, ETOH abuse, Hepatitis C antibody test positive (04/2012), Hyperpigmentation, Hypertension, PAD (peripheral artery disease) ([place]), Persistent atrial fibrillation ([place]) (08/23/2014), Thrombocytopenia ([place]), and Tobacco abuse.  Patient Summary: Kevin [name] is a 69 y.o. with a pertinent PMH of HTN, CKD, CAD, CHF, A-fib, PAD, Cocaine use, Thrombocytopenia, Cirrhosis, CVA, Seizures, and GERD , who presented with focal neurologic defecits and admitted for acute CVA. He/she is on hospital day 1 and continues to have residual  deficits.  Acute CVA:  Patient was found to have numerous small acute cortical/subcortical embolic-type infarcts within the left cerebral hemisphere affecting the left ACA, MCA, PCA and watershed territories on MRI.  Recent echo shows improvement of his HFrEF to 60% without thrombus present.  Patient is on Eliquis at home we will continue that in the hospital setting.  We will continue permissive hypertension get the patient evaluated with PT and OT for further recommendations.  Seems to have made mild improvements in speech overnight. - Permissive HTN (SBP <220, DBP <120) - A1c: 5.2 - Lipid: LDL of 78 and TChol of 138 on atorvastatin 40 mg daily.  - PT/OT pending. - I appreciate neuros recommendations.   HTN, CHF, CAD Recent echo shows resolution of his reduced ejection fraction to 55 to 60%.  There is no  evidence of left atrial thrombus present.  We will continue to hold patient's Entresto, Coreg, Lasix, spironolactone to allow for permissive hypertension -Continue strict ins and outs and daily weights.  Permanent A-fib:s - Continue Eliquis -Hold Coreg in setting of her permissive hypertension  Cirrhosis, Thrombocytopenia:  -Has chronic thrombocytopenia otherwise no signs or symptoms of decompensation.   Diet: Pending speech swallow evaluation IVF: None,None VTE: NOAC Code: Full PT/OT recs: Pending TOC recs: pending  Dispo: Anticipated discharge pending further evaluation management.   Dellia Cloud, D.O. MCIMTP, PGY-1 Date 03/03/2020 Time 10:16 AM Pager: [phone] Please contact the on call pager after 5 pm and on weekends at [phone].

## 2020-03-03 NOTE — Progress Notes (Signed)
STROKE TEAM PROGRESS NOTE   INTERVAL HISTORY I have personally reviewed history of presenting illness with the patient, electronic medical records and imaging films in PACS.  He presented with sudden onset of slurred speech and right-sided weakness.  He has a history of atrial fibrillation on anticoagulation with Eliquis MRI scan shows multifocal infarcts in left ACA, MCA watershed distribution.  CT angiogram shows no significant anterior circulation disease but does show narrowing of the left vertebral artery in the V1 segment.  Transthoracic echo shows normal ejection fraction.  LDL cholesterol 62 mg percent.  Hemoglobin A1c is 5.2.  EEG shows mild slowing on the left but no seizure activity  Vitals:   03/02/20 2300 03/02/20 2340 03/03/20 0310 03/03/20 0745  BP:  (!) 207/165 (!) 194/125 (!) 201/142  Pulse:  86  96  Resp:  15 16 20   Temp: 97.9 F (36.6 C) 97.8 F (36.6 C) 98 F (36.7 C) 97.7 F (36.5 C)  TempSrc:  Oral Oral Oral  SpO2:  98% 99% 98%  Weight:      Height:        CBC:  Recent Labs  Lab 03/02/20 0547 03/02/20 0547 03/02/20 0553 03/03/20 0654  WBC 8.7  --   --  7.2  NEUTROABS 7.1  --   --   --   HGB 15.1   < > 15.0 14.9  HCT 45.6   < > 44.0 44.9  MCV 102.7*  --   --  102.3*  PLT 53*  --   --  63*   < > = values in this interval not displayed.    Basic Metabolic Panel:  Recent Labs  Lab 03/02/20 0547 03/02/20 0547 03/02/20 0553 03/03/20 0654  NA 136   < > 139 136  K 4.0   < > 4.2 4.1  CL 107   < > 109 110  CO2 17*  --   --  20*  GLUCOSE 197*   < > 197* 121*  BUN 27*   < > 26* 19  CREATININE 1.95*   < > 2.00* 1.42*  CALCIUM 8.6*  --   --  8.3*   < > = values in this interval not displayed.   Lipid Panel:     Component Value Date/Time   CHOL 138 03/02/2020 0547   TRIG 89 03/02/2020 0547   HDL 42 03/02/2020 0547   CHOLHDL 3.3 03/02/2020 0547   VLDL 18 03/02/2020 0547   LDLCALC 78 03/02/2020 0547   HgbA1c:  Lab Results  Component Value Date    HGBA1C 5.2 03/02/2020   Urine Drug Screen:     Component Value Date/Time   LABOPIA NONE DETECTED 01/31/2016 0651   COCAINSCRNUR NONE DETECTED 01/31/2016 0651   LABBENZ NONE DETECTED 01/31/2016 0651   AMPHETMU NONE DETECTED 01/31/2016 0651   THCU POSITIVE (A) 01/31/2016 0651   LABBARB NONE DETECTED 01/31/2016 0651    Alcohol Level     Component Value Date/Time   ETH <10 03/02/2020 0547    IMAGING past 24 hours EEG  Result Date: 03/02/2020 05/02/2020, MD     03/02/2020 12:24 PM Patient Name: KALON ERHARDT MRN: [name] Attending: [id number] Referring Physician/Provider: Dr. Charlsie Quest Aroor Date: 03/02/2020 Duration: 23.22 minutes Patient history: 69 year old male with history of seizures as well as stroke who presented with right-sided weakness.  EEG evaluate for seizures. Level of alertness: Awake, drowsy, sleep, comatose, lethargic AEDs during EEG study: Keppra Technical aspects: This EEG  study was done with scalp electrodes positioned according to the 10-20 International system of electrode placement. Electrical activity was acquired at a sampling rate of 500Hz  and reviewed with a high frequency filter of 70Hz  and a low frequency filter of 1Hz . EEG data were recorded continuously and digitally stored. Description: The posterior dominant rhythm consists of 8-9 Hz activity of moderate voltage (25-35 uV) seen predominantly in posterior head regions, symmetric and reactive to eye opening and eye closing. EEG showed intermittent generalized and lateralized 3 to 6 Hz theta-delta slowing. Hyperventilation and photic stimulation were not performed.   ABNORMALITY -Intermittent slow, generalized and lateralized left hemisphere IMPRESSION: This study is suggestive of nonspecific cortical dysfunction in left hemisphere as well as mild diffuse encephalopathy, nonspecific to etiology. No seizures or epileptiform discharges were seen throughout the recording.   MR  BRAIN WO CONTRAST  Result Date: 03/02/2020 CLINICAL DATA:  Focal neuro deficit, greater than 6 hours, stroke suspected. Additional history provided: History of hypertension, CKD, CAD, CHF, atrial fibrillation, P 80, cocaine use, thrombocytopenia, cirrhosis, CVA, seizures and GERD, presenting with right-sided weakness, facial droop and slurred speech. EXAM: MRI HEAD WITHOUT CONTRAST TECHNIQUE: Multiplanar, multiecho pulse sequences of the brain and surrounding structures were obtained without intravenous contrast. COMPARISON:  Noncontrast head CT, CT angiogram head/neck and CT perfusion performed earlier the same day 03/02/2020, brain MRI 01/31/2016 FINDINGS: Brain: Mild intermittent motion degradation. There are numerous small embolic type acute cortical/subcortical infarcts within the left cerebral hemisphere predominantly affecting the left ACA, MCA and watershed territories. To a lesser degree, there are multiple acute cortical/subcortical infarcts within the left parietooccipital lobe in the left PCA territory. There also multiple small acute infarcts within the left basal ganglia and thalami. Corresponding T2/FLAIR hyperintensity at these sites. Redemonstrated chronic cortically based right occipital lobe infarct. Background moderate/advanced patchy T2/FLAIR hyperintensity within the cerebral white matter which is nonspecific, but consistent with chronic small vessel ischemic disease. To a lesser degree, chronic small vessel ischemic changes are present within the pons. Chronic lacunar infarcts within the deep gray nuclei are more numerous as compared to MRI 01/31/2016. There is also a chronic infarct within the paramedian right pons which was not present on this prior examination. Nonspecific chronic microhemorrhage within the right thalamus. Stable, moderate generalized parenchymal atrophy. No evidence of intracranial mass. No extra-axial fluid collection. No midline shift. Vascular: Expected proximal  arterial flow voids. Skull and upper cervical spine: No focal marrow lesion. Sinuses/Orbits: Visualized orbits show no acute finding. Mild paranasal sinus mucosal thickening, most notably ethmoid. No significant mastoid effusion. IMPRESSION: Numerous small acute cortical/subcortical embolic-type infarcts within the left cerebral hemisphere affecting the left ACA, MCA, PCA and watershed territories. Multiple small acute infarcts are also present within the left basal ganglia and left thalamus. Redemonstrated chronic cortically based right occipital lobe infarct. Background moderate to advanced chronic small vessel ischemic disease. Multiple chronic lacunar infarcts within the deep gray nuclei have increased in number as compared to MRI 01/31/2016. Additionally, there is a chronic infarct within the right pons which was not present on this prior examination. Moderate generalized parenchymal atrophy. Mild paranasal sinus mucosal thickening. Electronically Signed   By: 04/01/2016 DO   On: 03/02/2020 14:39   ECHOCARDIOGRAM COMPLETE  Result Date: 03/02/2020    ECHOCARDIOGRAM REPORT   Patient Name:   KYI ROMANELLO Date of Exam: 03/02/2020 Medical Rec #:  05/02/2020         Height:  73.0 in Accession #:    [id number]        Weight:       227.8 lb Date of Birth:  1951/01/10         BSA:          2.274 m Patient Age:    57 years          BP:           139/110 mmHg Patient Gender: M                 HR:           74 bpm. Exam Location:  Inpatient Procedure: 2D Echo and Intracardiac Opacification Agent Indications:    Stroke 434.91 / I163.9                 Congestive Heart Failure 428.0 / I50.9  History:        Patient has prior history of Echocardiogram examinations, most                 recent 03/06/2016. CAD, Stroke and PAD, Arrythmias:Atrial                 Fibrillation; Risk Factors:Hypertension. Chronic kidney disease,                 Cocaine use, Thrombocytopenia, Cirrhosis, GERD.  Sonographer:    Darlina Sicilian RDCS Referring Phys: [id number] [place]  1. Left ventricular ejection fraction, by estimation, is 60 to 65%. The left ventricle has normal function. The left ventricle has no regional wall motion abnormalities. There is severe concentric left ventricular hypertrophy. Left ventricular diastolic  parameters are indeterminate. Elevated left ventricular end-diastolic pressure.  2. Right ventricular systolic function is normal. The right ventricular size is normal. There is normal pulmonary artery systolic pressure.  3. Left atrial size was severely dilated.  4. The mitral valve is normal in structure. Trivial mitral valve regurgitation. No evidence of mitral stenosis.  5. The aortic valve is normal in structure. Aortic valve regurgitation is not visualized. No aortic stenosis is present.  6. The inferior vena cava is normal in size with greater than 50% respiratory variability, suggesting right atrial pressure of 3 mmHg. FINDINGS  Left Ventricle: Left ventricular ejection fraction, by estimation, is 60 to 65%. The left ventricle has normal function. The left ventricle has no regional wall motion abnormalities. Definity contrast agent was given IV to delineate the left ventricular  endocardial borders. The left ventricular internal cavity size was normal in size. There is severe concentric left ventricular hypertrophy. Left ventricular diastolic parameters are indeterminate. Elevated left ventricular end-diastolic pressure. Right Ventricle: The right ventricular size is normal. No increase in right ventricular wall thickness. Right ventricular systolic function is normal. There is normal pulmonary artery systolic pressure. The tricuspid regurgitant velocity is 1.63 m/s, and  with an assumed right atrial pressure of 3 mmHg, the estimated right ventricular systolic pressure is 47.8 mmHg. Left Atrium: Left atrial size was severely dilated. Right Atrium: Right atrial size was normal in size.  Pericardium: There is no evidence of pericardial effusion. Mitral Valve: The mitral valve is normal in structure. Normal mobility of the mitral valve leaflets. Trivial mitral valve regurgitation. No evidence of mitral valve stenosis. Tricuspid Valve: The tricuspid valve is normal in structure. Tricuspid valve regurgitation is trivial. No evidence of tricuspid stenosis. Aortic Valve: The aortic valve is normal in structure.. There is mild thickening and mild calcification of the  aortic valve. Aortic valve regurgitation is not visualized. No aortic stenosis is present. There is mild thickening of the aortic valve. There is mild calcification of the aortic valve. Pulmonic Valve: The pulmonic valve was normal in structure. Pulmonic valve regurgitation is not visualized. No evidence of pulmonic stenosis. Aorta: The aortic root is normal in size and structure. Venous: The inferior vena cava is normal in size with greater than 50% respiratory variability, suggesting right atrial pressure of 3 mmHg. IAS/Shunts: No atrial level shunt detected by color flow Doppler.  LEFT VENTRICLE PLAX 2D LVIDd:         3.66 cm     Diastology LVIDs:         2.29 cm     LV e' lateral:   6.60 cm/s LV PW:         1.59 cm     LV E/e' lateral: 12.1 LV IVS:        1.83 cm     LV e' medial:    4.76 cm/s LVOT diam:     2.40 cm     LV E/e' medial:  16.8 LV SV:         44 LV SV Index:   19 LVOT Area:     4.52 cm  LV Volumes (MOD) LV vol d, MOD A2C: 52.9 ml LV vol d, MOD A4C: 64.7 ml LV vol s, MOD A2C: 28.5 ml LV vol s, MOD A4C: 35.8 ml LV SV MOD A2C:     24.4 ml LV SV MOD A4C:     64.7 ml LV SV MOD BP:      26.4 ml LEFT ATRIUM              Index LA diam:        4.30 cm  1.89 cm/m LA Vol (A2C):   50.9 ml  22.38 ml/m LA Vol (A4C):   103.0 ml 45.29 ml/m LA Biplane Vol: 78.3 ml  34.43 ml/m  AORTIC VALVE LVOT Vmax:   64.95 cm/s LVOT Vmean:  45.250 cm/s LVOT VTI:    0.097 m  AORTA Ao Root diam: 3.10 cm MITRAL VALVE               TRICUSPID VALVE MV Area  (PHT): 5.97 cm    TR Peak grad:   10.6 mmHg MV Decel Time: 127 msec    TR Vmax:        163.00 cm/s MV E velocity: 79.95 cm/s                            SHUNTS                            Systemic VTI:  0.10 m                            Systemic Diam: 2.40 cm Chilton Si MD Electronically signed by Chilton Si MD Signature Date/Time: 03/02/2020/2:53:05 PM    Final     PHYSICAL EXAM Frail elderly male not in distress.  He is hard of hearing . Afebrile. Head is nontraumatic. Neck is supple without bruit.    Cardiac exam no murmur or gallop. Lungs are clear to auscultation. Distal pulses are well felt. Neurological Exam :  Awake alert oriented to time and place.  Diminished attention, registration and recall.  Mild dysarthria  but can be understood.  Follows commands well.  Extraocular movements are full range without nystagmus.  Blinks to threat bilaterally.  Vision acuity seems normal.  Hearing is diminished mildly bilaterally.  Face appears symmetric.  Tongue midline.  Motor system exam shows right hemiparesis with 3/5 right upper extremity strength and 2/5 right lower extremity strength with drift.  Tone is diminished on the right compared to the left.  Normal strength on the left.  Sensation is to be preserved bilaterally.  Gait not tested.   ASSESSMENT/PLAN Mr. IZIC STFORT is a 69 y.o. male with history of atrial fibrillation on Eliquis, hypertension, hyperlipidemia, chronic systolic heart failure, coronary artery disease, peripheral artery disease, cirrhosis with thrombocytopenia, hepatitis C, alcohol abuse, tobacco abuse, seizures presenting with status epilepticus in May 2017, who woke with right-sided weakness and slurred speech.    Stroke:  L ACA/MCA/PCA infarcts embolic secondary to AF on Eliquis  Code Stroke CT head No acute abnormality. Small vessel disease. Old lacunar infarcts. Old R occipital cortex infarct. ASPECTS 10.     CTA head & neck no LVO. Cervical ICA  atherosclerosis. L VA atherosclerosis w/ ?high-grade narrowing. Intracranial atherosclerosis.  CT perfusion R cerebellar white matter 37mL felt to be noise  MRI  Numerous small cortical/subcortical L ACA, MCA, PCA watershed infarcts. L basal ganglia and L thalamic infarcts. Old R occipital infarct. Moderate to advanced small vessel disease. Multiple chronic lacunar infarcts. Moderate atrophy. Sinus dz.  2D Echo EF 60-65%. No source of embolus    EEG no sz   LDL 78  HgbA1c 5.2  Eliquis for VTE prophylaxis  Eliquis (apixaban) daily prior to admission, now on Eliquis (apixaban) daily.    Therapy recommendations:  CIR  Disposition:  pending   Atrial Fibrillation  Home anticoagulation:  Eliquis (apixaban) daily   No head to head comparison to say one DOAC is better than another.    Marland Kitchen Continue AC at discharge   Hypertensive Urgency  BP as high as 206/175   Remains elevated in to 200s today . Permissive hypertension (OK if < 220/120) but gradually normalize in 5-7 days . Long-term BP goal normotensive  Hyperlipidemia  Home meds:  crestor 20  Now on lipitor 40  LDL 78, goal < 70  Continue statin at discharge  Other Stroke Risk Factors  Advanced age  Cigarette smoker, advised to stop smoking  Hx ETOH abuse with current alcohol use, alcohol level <10, advised to drink no more than 2 drink(s) a day  Hx Substance abuse (cocaine) - UDS: not performed  Obesity, Body mass index is 29.73 kg/m., recommend weight loss, diet and exercise as appropriate   Coronary artery disease  Congestive heart failure  PAD  Other Active Problems  Hx seizures, on keppra 1000 bid  Hepatitis C  Hospital day # 1 Patient has presented with embolic left MCA branch infarcts secondary to atrial fibrillation while on anticoagulation with Eliquis.  I discussed with patient alternative treatment options including Xarelto, Pradaxa and warfarin but there is no definite data about superiority  of these other agents over Eliquis and his co-pay may go up if he switches hence patient does not want to change.  I also am not aware of any strong data suggesting adding aspirin to Eliquis is any better but that will certainly increase bleeding risk.  Recommend physical occupational therapy.  Mobilize out of bed.  He would likely need rehab.  Maintain aggressive risk factor modification.  Long discussion with the patient  and family member at the bedside and answered questions.  Greater than 50% time during this 25-minute visit was spent on counseling and coordination of care about his stroke and atrial fibrillation discussion about anticoagulation options and answering questions.  Stroke team will sign off.  Kindly call for questions Delia Heady, MD To contact Stroke Continuity provider, please refer to [email or web address]. After hours, contact General Neurology

## 2020-03-03 NOTE — Progress Notes (Signed)
Rehab Admissions Coordinator Note:  Patient was screened by Clois Dupes for appropriateness for an Inpatient Acute Rehab Consult per therapy recs.   At this time, we are recommending Inpatient Rehab consult. I will place order per protocol.  Clois Dupes RN MSN 03/03/2020, 2:01 PM  I can be reached at [phone].

## 2020-03-03 NOTE — Progress Notes (Signed)
Occupational Therapy Evaluation  Clinical Impression: PTA pt reports being independent with ADLs and driving. Pt limited by problem list below. Pt A&Ox2 (person and place) needs Total +2 -Max A +2 for physical assist and safety with ADLs due to impairments in balance, R-sided weakness, and decreased safety awareness. Pt impulsive with trying to get up from EOB and kept asking why his "arm and leg won't work" even though pt was oriented by therapists and MD. Needs increased time to respond to questions and has difficulty word finding. Monitored pt's BP and HR throughout session and notified RN (initial supine 164/124 sitting EOB 173/117, supine 200/134, supine 216/105 HR 90-124). Pt attempted sit<>stand x2 with Max A +2 bilateral hand hold A with cues for hand placement and posture but could not remain upright due to weakness. Pt's wife available during eval and confirmed pt information. Believe pt would benefit from skilled OT services acutely and at the CIR level to help return to PLOF and increase safety.     03/03/20 1200  OT Visit Information  Last OT Received On 03/03/20  Assistance Needed +2  PT/OT/SLP Co-Evaluation/Treatment Yes  Reason for Co-Treatment Complexity of the patient's impairments (multi-system involvement);For patient/therapist safety;To address functional/ADL transfers  OT goals addressed during session ADL's and self-care;Strengthening/ROM  History of Present Illness Pt is a 69 y.o. male with PMH of HTN, CAD, CHF, A fib, PAD, TIA, CVA, Hepatitis C, Cocaine use, Thrombocytopenia, Cirrhosis, seizures, and GERD who presented to the ED after waking up in the morning with right sided weakness, facial droop and slurred speech. Was taken immediately to CT scan which showed no hemorrhage.  tPA was not administered as patient was outside the window and he is on Eliquis which he is compliant.  CT angiogram was negative for large vessel occlusion and a CT perfusion did not show obvious  perfusion deficit. Numerous small acute cortical/subcortical embolic-type infarcts.  Precautions  Precautions Fall  Restrictions  Weight Bearing Restrictions No  Home Living  Family/patient expects to be discharged to: Private residence  Living Arrangements Spouse/significant other  Available Help at Discharge Family;Available PRN/intermittently  Type of Home House  Home Access Stairs to enter  Home Equipment [place] - single point  Additional Comments Pt was utilizing single point cane for ambulation PTA. More information on home set-up needed  Prior Function  Level of Independence Independent with assistive device(s)  Comments driving  Communication  Communication Expressive difficulties;Other (comment) (increase time to respond, difficulty with word finding)  Pain Assessment  Pain Assessment Faces  Faces Pain Scale 2  Pain Location R LE  Pain Descriptors / Indicators Grimacing;Guarding;Discomfort  Pain Intervention(s) Monitored during session;Repositioned  Cognition  Arousal/Alertness Awake/alert  Behavior During Therapy WFL for tasks assessed/performed;Impulsive  Overall Cognitive Status Impaired/Different from baseline  Area of Impairment Orientation;Problem solving;Awareness;Safety/judgement;Memory;Following commands;Attention  Orientation Level Disoriented to;Time;Situation  Current Attention Level Sustained  Memory Decreased short-term memory  Following Commands Follows one step commands inconsistently;Follows one step commands with increased time  Safety/Judgement Decreased awareness of safety;Decreased awareness of deficits  Awareness Intellectual  Problem Solving Slow processing;Difficulty sequencing;Requires verbal cues;Requires tactile cues  General Comments Pt A&Ox2 and kept asking why his arm and leg didn't work after being told he had endured a stroke.  Requires cuing for safety. Poor awareness of deficits - pt says he can walk by himself.   Upper Extremity  Assessment  Upper Extremity Assessment RUE deficits/detail  RUE Deficits / Details 3-/5 in shoulder flexion and elbow flexion. 3/5 for  grip strength. PROM WFL, AROM limited by strength and motor planning - unable to functionally use even as gross support  RUE Sensation decreased light touch;decreased proprioception  RUE Coordination decreased fine motor;decreased gross motor  Lower Extremity Assessment  Lower Extremity Assessment Defer to PT evaluation  ADL  Overall ADL's  Needs assistance/impaired  Grooming Minimal assistance;Bed level  Upper Body Bathing Sitting;Cueing for safety;Maximal assistance  Upper Body Bathing Details (indicate cue type and reason) max A due to lack of ROM/strength in RUE and impaired sitting balance  Lower Body Bathing Maximal assistance;Bed level;Cueing for sequencing  Upper Body Dressing  Cueing for sequencing;Sitting;Maximal assistance  Lower Body Dressing Sit to/from stand;Total assistance;+2 for physical assistance  Toilet Transfer Details (indicate cue type and reason) deferred due to safety  Toileting- Clothing Manipulation and Hygiene Cueing for safety;Cueing for sequencing;Sit to/from stand;Total assistance;+2 for physical assistance  Tub/ Engineer, structural  (deferred due to safety)  Functional mobility during ADLs Maximal assistance;+2 for physical assistance;+2 for safety/equipment  General ADL Comments Max A +2 due to R-side weakness, cognition, safety awareness, and balance  Vision- Assessment  Additional Comments Assess next session  Bed Mobility  Overal bed mobility Needs Assistance  Bed Mobility Supine to Sit;Sit to Supine  Supine to sit +2 for physical assistance;+2 for safety/equipment;Max assist  Sit to supine +2 for physical assistance;+2 for safety/equipment;Max assist  General bed mobility comments requires assist for R UE/LE trunk and scooting forward with the pad  Transfers  Overall transfer level Needs assistance  Equipment used 2  person hand held assist  Transfers Sit to/from Stand  Sit to Stand +2 physical assistance;Max assist  General transfer comment tried to stand pt x2 unable to completely stand and straighten up and hold position. Required cueing for hand positioning and posture.  Balance  Overall balance assessment Needs assistance  Sitting-balance support Single extremity supported;Feet supported  Sitting balance-Leahy Scale Poor  Sitting balance - Comments pt leaning forward and to the right needing verbal cues to try and straighten up. Initially able to correct posture but with fatigue requires min physical A  Postural control Right lateral lean;Other (comment) (anterior)  Standing balance support Bilateral upper extremity supported;During functional activity  Standing balance-Leahy Scale Poor  Standing balance comment unable to completely stand and straighten up and hold position - needing max A +2  General Comments  General comments (skin integrity, edema, etc.) Wife present for eval verifying information from pt. BP throughout session initial supine 164/124 sitting EOB 173/117, supine 200/134, supine 216/105 HR 90-124 RN notified  OT - End of Session  Equipment Utilized During Treatment Gait belt  Activity Tolerance Patient tolerated treatment well  Patient left in bed;with bed alarm set;with family/visitor present;Other (comment);with call bell/phone within reach (wife in room )  Nurse Communication Mobility status;Other (comment) (BP during session)  OT Assessment  OT Recommendation/Assessment Patient needs continued OT Services  OT Visit Diagnosis Unsteadiness on feet (R26.81);Other abnormalities of gait and mobility (R26.89);Muscle weakness (generalized) (M62.81);Other symptoms and signs involving the nervous system (R29.898);Other symptoms and signs involving cognitive function;Cognitive communication deficit (R41.841);Pain;Hemiplegia and hemiparesis  Symptoms and signs involving cognitive  functions Cerebral infarction  Hemiplegia - Right/Left Right  Hemiplegia - dominant/non-dominant Non-Dominant  Hemiplegia - caused by Cerebral infarction  Pain - Right/Left Right  Pain - part of body Leg  OT Problem List Decreased strength;Decreased range of motion;Decreased activity tolerance;Impaired balance (sitting and/or standing);Decreased coordination;Decreased cognition;Decreased safety awareness;Decreased knowledge of use of DME or AE;Decreased knowledge of precautions;Impaired  sensation;Impaired UE functional use;Pain;Obesity;Impaired tone;Impaired vision/perception;Cardiopulmonary status limiting activity  OT Plan  OT Frequency (ACUTE ONLY) Min 3X/week  OT Treatment/Interventions (ACUTE ONLY) Self-care/ADL training;Therapeutic exercise;Neuromuscular education;Energy conservation;DME and/or AE instruction;Manual therapy;Therapeutic activities;Splinting;Cognitive remediation/compensation;Patient/family education;Balance training;Visual/perceptual remediation/compensation  AM-PAC OT "6 Clicks" Daily Activity Outcome Measure (Version 2)  Help from another person eating meals? 3  Help from another person taking care of personal grooming? 2  Help from another person toileting, which includes using toliet, bedpan, or urinal? 1  Help from another person bathing (including washing, rinsing, drying)? 2  Help from another person to put on and taking off regular upper body clothing? 2  Help from another person to put on and taking off regular lower body clothing? 1  6 Click Score 11  OT Recommendation  Recommendations for Other Services Rehab consult  Follow Up Recommendations CIR  OT Equipment  (TBD at next venue of care)  Individuals Consulted  Consulted and Agree with Results and Recommendations Patient;Family Midwife (wife)  Family Member Consulted wife  Acute Rehab OT Goals  Patient Stated Goal get better and come home  OT Goal Formulation With family  Time For Goal  Achievement 03/17/20  Potential to Achieve Goals Good  OT Time Calculation  OT Start Time (ACUTE ONLY) 1113  OT Stop Time (ACUTE ONLY) 1147  OT Time Calculation (min) 34 min  Written Expression  Dominant Hand Left   Bernadine Melecio/OTS

## 2020-03-04 LAB — COMPREHENSIVE METABOLIC PANEL
ALT: 26 U/L (ref 0–44)
AST: 28 U/L (ref 15–41)
Albumin: 3.2 g/dL — ABNORMAL LOW (ref 3.5–5.0)
Alkaline Phosphatase: 52 U/L (ref 38–126)
Anion gap: 10 (ref 5–15)
BUN: 16 mg/dL (ref 8–23)
CO2: 19 mmol/L — ABNORMAL LOW (ref 22–32)
Calcium: 8.4 mg/dL — ABNORMAL LOW (ref 8.9–10.3)
Chloride: 109 mmol/L (ref 98–111)
Creatinine, Ser: 1.29 mg/dL — ABNORMAL HIGH (ref 0.61–1.24)
GFR calc Af Amer: >60 mL/min (ref >60)
GFR calc non Af Amer: 57 mL/min — ABNORMAL LOW (ref >60)
Glucose, Bld: 129 mg/dL — ABNORMAL HIGH (ref 70–99)
Potassium: 3.8 mmol/L (ref 3.5–5.1)
Sodium: 138 mmol/L (ref 135–145)
Total Bilirubin: 1.5 mg/dL — ABNORMAL HIGH (ref 0.3–1.2)
Total Protein: 6.6 g/dL (ref 6.5–8.1)

## 2020-03-04 LAB — CBC
HCT: 45 % (ref 39.0–52.0)
Hemoglobin: 15.2 g/dL (ref 13.0–17.0)
MCH: 34.1 pg — ABNORMAL HIGH (ref 26.0–34.0)
MCHC: 33.8 g/dL (ref 30.0–36.0)
MCV: 100.9 fL — ABNORMAL HIGH (ref 80.0–100.0)
Platelets: 62 10*3/uL — ABNORMAL LOW (ref 150–400)
RBC: 4.46 MIL/uL (ref 4.22–5.81)
RDW: 11.7 % (ref 11.5–15.5)
WBC: 7.1 10*3/uL (ref 4.0–10.5)
nRBC: 0 % (ref 0.0–0.2)

## 2020-03-04 LAB — GLUCOSE, CAPILLARY
Glucose-Capillary: 106 mg/dL — ABNORMAL HIGH (ref 70–99)
Glucose-Capillary: 153 mg/dL — ABNORMAL HIGH (ref 70–99)

## 2020-03-04 MED ORDER — CARVEDILOL 12.5 MG PO TABS
25.0000 mg | ORAL_TABLET | Freq: Two times a day (BID) | ORAL | Status: DC
  Administered 2020-03-04 – 2020-03-06 (×5): 25 mg via ORAL
  Filled 2020-03-04 (×5): qty 2

## 2020-03-04 MED ORDER — LABETALOL HCL 5 MG/ML IV SOLN
10.0000 mg | Freq: Once | INTRAVENOUS | Status: AC
  Administered 2020-03-04: 10 mg via INTRAVENOUS
  Filled 2020-03-04: qty 4

## 2020-03-04 NOTE — PMR Pre-admission (Signed)
PMR Admission Coordinator Pre-Admission Assessment  Patient: Kevin [name] is an 69 y.o., male MRN: [id number] DOB: Feb 18, 1951 Height: _0  (185.4 cm) Weight: 102.2 kg              Insurance Information HMO: yes    PPO:      PCP:      IPA:      80/20:      OTHER:  PRIMARY: [employer]      Policy#: [id number]      Subscriber: patient CM Name: Kevin [name]       Phone#: [phone]     Fax#: [phone] Pre-Cert#: [id number]      Employer:  Josem Kaufmann provided by Kevin [name] for admit to CIR. Pt is approved for 5 days with start date 6/14. Weekly updates are due to Kevin [name] (p): [phone] [id number] (f): [phone] Benefits:  Phone #: online at [email or web address]     Name: transaction ID #[id number] Eff. Date: 09/24/19- 09/22/20      Deduct: does not have for in-network providers      Out of Pocket Max: $3,900 ($100 met)       Life Max: NA  CIR: $295/day co-pay for days 1-6, $0/day co-pya for days 7-90      SNF: $0/day co-pay for days 1-20, $184/day co-pay for days 21-100; limited to 100 days/cal yr Outpatient: $10-40/visist co-pay pending service; visits limited by medical necessity     Home Health: 100% coverage, 0% co-insurance; limited by medical necessity DME: 80% coverage, 20% co-insurance     Providers:   SECONDARY: None      Policy#:       Phone#:   The "Data Collection Information Summary" for patients in Inpatient Rehabilitation Facilities with attached "Privacy Act [place] Records" was provided and verbally reviewed with: Patient and Family  Emergency Contact Information Contact Information    Name Relation Home Work Mobile   [place] Spouse [phone]  [phone]     Current Medical History  Patient Admitting Diagnosis: CVA History of Present Illness: Kevin [name] is a 69 y.o. right-handed male with history of hypertension, CKD, CAD, CHF, atrial fibrillation maintained on Eliquis, cocaine/alcohol/tobacco use.  Per chart review patient lives  with spouse.  Independent with assistive device.  Two-level home bed and bath upstairs.  Presented 03/02/2020 with right side weakness and slurred speech as well as question seizure. [facility] MRI showed numerous small acute cortical subcortical embolic type infarcts within the left cerebral hemisphere affecting the left ACA, MCA, PCA and watershed territories.  Multiple small acute infarcts also present within the left basal ganglia and left thalamus.  Echocardiogram with ejection fraction of 65% no wall motion abnormalities.  EEG negative for seizure.  Admission chemistries with glucose 197, BUN 27, creatinine 1.95, urine drug screen pending, alcohol negative.  Patient currently maintained on Eliquis as prior to admission.  Keppra added for seizure prophylaxis.  Tolerating a regular diet.   Complete NIHSS TOTAL: (P) 9 Glasgow Coma Scale Score: 14  Past Medical History  Past Medical History:  Diagnosis Date  . Atrial fibrillation, permanent ([place]) 02/01/2016  . Blood transfusion without reported diagnosis   . CAD in native artery cath 2016 90% mRamus  02/01/2016  . CHF (congestive heart failure) ([place])   . Cholelithiases 05/01/2012  . Claudication ([place])    bilateral  . Cocaine abuse ([place])   . Dysrhythmia    afib  . ETOH abuse   . Hepatitis C antibody test positive 04/2012  . Hyperpigmentation   .  Hypertension   . PAD (peripheral artery disease) ([place])   . Persistent atrial fibrillation ([place]) 08/23/2014  . Thrombocytopenia ([place])   . Tobacco abuse     Family History  family history includes Cancer in his mother; Seizures in his daughter.  Prior Rehab/Hospitalizations:  Has the patient had prior rehab or hospitalizations prior to admission? No  Has the patient had major surgery during 100 days prior to admission? No  Current Medications   Current Facility-Administered Medications:  .  0.9 %  sodium chloride infusion, , Intravenous, PRN, Aldine Contes, MD, Last Rate: 10 mL/hr at 03/03/20 2033,  250 mL at 03/03/20 2033 .  apixaban (ELIQUIS) tablet 5 mg, 5 mg, Oral, BID, Neva Seat, MD, 5 mg at 03/06/20 1010 .  atorvastatin (LIPITOR) tablet 40 mg, 40 mg, Oral, q1800, Neva Seat, MD, 40 mg at 03/05/20 1724 .  carvedilol (COREG) tablet 25 mg, 25 mg, Oral, BID WC, Santos-Sanchez, Idalys, MD, 25 mg at 03/06/20 1010 .  fluticasone furoate-vilanterol (BREO ELLIPTA) 100-25 MCG/INH 1 puff, 1 puff, Inhalation, Daily, 1 puff at 03/06/20 0817 **AND** umeclidinium bromide (INCRUSE ELLIPTA) 62.5 MCG/INH 1 puff, 1 puff, Inhalation, Daily, Neva Seat, MD, 1 puff at 03/06/20 0817 .  furosemide (LASIX) tablet 20 mg, 20 mg, Oral, Daily, Marianna Payment, MD .  ipratropium-albuterol (DUONEB) 0.5-2.5 (3) MG/3ML nebulizer solution 3 mL, 3 mL, Nebulization, Q6H PRN, Neva Seat, MD, 3 mL at 03/06/20 1334 .  levETIRAcetam (KEPPRA) tablet 1,000 mg, 1,000 mg, Oral, BID, Marianna Payment, MD, 1,000 mg at 03/06/20 1010 .  sacubitril-valsartan (ENTRESTO) 97-103 mg per tablet, 1 tablet, Oral, BID, Marianna Payment, MD, 1 tablet at 03/06/20 1011 .  sodium chloride flush (NS) 0.9 % injection 3 mL, 3 mL, Intravenous, Q12H, Neva Seat, MD, 3 mL at 03/06/20 1011 .  spironolactone (ALDACTONE) tablet 12.5 mg, 12.5 mg, Oral, Once, Marianna Payment, MD  Patients Current Diet:  Diet Order            Diet - low sodium heart healthy           Diet Heart Room service appropriate? Yes; Fluid consistency: Thin  Diet effective now                 Precautions / Restrictions Precautions Precautions: Fall Restrictions Weight Bearing Restrictions: No   Has the patient had 2 or more falls or a fall with injury in the past year?No  Prior Activity Level Limited Community (1-2x/wk): 1-2x a week  Prior Functional Level Prior Function Level of Independence: Independent with assistive device(s) Comments: driving  Self Care: Did the patient need help bathing, dressing, using the toilet or eating?   Independent  Indoor Mobility: Did the patient need assistance with walking from room to room (with or without device)? Independent with occasional use of cane  Stairs: Did the patient need assistance with internal or external stairs (with or without device)? Pt. Was not able to climb stairs prior to admission  Functional Cognition: Did the patient need help planning regular tasks such as shopping or remembering to take medications? Independent  Home Assistive Devices / Equipment Home Assistive Devices/Equipment: Cane (specify quad or straight) Home Equipment: Cane - single point  Prior Device Use: Indicate devices/aids used by the patient prior to current illness, exacerbation or injury? Walker and Single point cane (Pt. Used intermittently)  Current Functional Level Cognition  Overall Cognitive Status: Impaired/Different from baseline Current Attention Level: Sustained Orientation Level: (P) Oriented to person Following Commands: Follows one step  commands with increased time, Follows multi-step commands inconsistently Safety/Judgement: Decreased awareness of safety, Decreased awareness of deficits General Comments: Pt A&Ox3 with incorrect year. Needed increased time to complete commands and often forgot what he was doing. Pt unaware of safety with impulsivity upon trying to stand.    Extremity Assessment (includes Sensation/Coordination)  Upper Extremity Assessment: RUE deficits/detail RUE Deficits / Details: PROM WFL, AROM limited by strength and motor planning - pt is seen trying to use UE more but decreased functional use. Increased tightness in R elbow with confirmed pain with pt. Educated on self-ROM. 3/5 MMT in wrist flexion/extension, forearm supination/pronation RUE Sensation: decreased light touch, decreased proprioception RUE Coordination: decreased fine motor, decreased gross motor  Lower Extremity Assessment: RLE deficits/detail RLE Deficits / Details: Patient demonstrates  grossly 2+/5 strength in R LE. Unable to hold against gravity, reports decreased sensation RLE Sensation: decreased light touch RLE Coordination: decreased gross motor    ADLs  Overall ADL's : Needs assistance/impaired Grooming: Moderate assistance, Cueing for sequencing, Standing Grooming Details (indicate cue type and reason): Mod A in utilizing affected UE to bring hand to face. Mod cueing for reminding pt of task and sequencing Upper Body Bathing: Sitting, Cueing for safety, Maximal assistance Upper Body Bathing Details (indicate cue type and reason): max A due to lack of ROM/strength in RUE and impaired sitting balance Lower Body Bathing: Maximal assistance, Bed level, Cueing for sequencing Upper Body Dressing : Maximal assistance, Cueing for compensatory techniques Upper Body Dressing Details (indicate cue type and reason): Max A with verbal cues to use compensatory techniques to thread affected arm through gown with non-affected arm.  Lower Body Dressing: Sit to/from stand, Total assistance, +2 for physical assistance Toilet Transfer: Moderate assistance, +2 for physical assistance, +2 for safety/equipment, Cueing for sequencing, Minimal assistance, RW Toilet Transfer Details (indicate cue type and reason): simulated with recliner. Mod A +2 for stabilzing without walker and min A +2 with walker. Mod cueing for hand placement and steps.  Toileting- Water quality scientist and Hygiene: Cueing for safety, Cueing for sequencing, Sit to/from stand, Total assistance, +2 for physical assistance Tub/ Shower Transfer:  (deferred due to safety) Functional mobility during ADLs: Moderate assistance, +2 for physical assistance, +2 for safety/equipment, Cueing for sequencing General ADL Comments: Pt mod - min A +2 for ADLs due to weakness on R side, decreased motor planning, and decreased cognition needing mod verbal cues for hand placement and correcting posture in seated and standing    Mobility   Overal bed mobility: Needs Assistance Bed Mobility: Supine to Sit Supine to sit: Mod assist, HOB elevated Sit to supine: Mod assist, +2 for physical assistance General bed mobility comments: cues for sequencing and use of rials and to elevate trunk into sitting     Transfers  Overall transfer level: Needs assistance Equipment used: Rolling walker (2 wheeled) Transfers: Sit to/from Stand Sit to Stand: +2 physical assistance, Mod assist, Min assist, +2 safety/equipment General transfer comment: stood pt x2. First stand min A +2 and with fatigue needing Mod A +2 for R knee blocking and R UE positioning to bear weight    Ambulation / Gait / Stairs / Wheelchair Mobility  Ambulation/Gait General Gait Details: unable at this time    Posture / Balance Dynamic Sitting Balance Sitting balance - Comments: pt leaning forward and the right needing verbal cues to fix posture. Balance Overall balance assessment: Needs assistance Sitting-balance support: Single extremity supported, Feet supported Sitting balance-Leahy Scale: Poor Sitting balance - Comments:  pt leaning forward and the right needing verbal cues to fix posture. Postural control: Right lateral lean (anterior) Standing balance support: Bilateral upper extremity supported, During functional activity Standing balance-Leahy Scale: Poor Standing balance comment: unable to get fully standing. Requires +2 mod/max assist    Special needs/care consideration Designated visitor Deondrae Mcgrail (wife)     Previous Home Environment (from acute therapy documentation) Living Arrangements: Spouse/significant other  Lives With: Spouse Available Help at Discharge: Family Type of Home: House Home Layout: One level Alternate Level Stairs-Rails: Right Home Access: Stairs to enter Entrance Stairs-Rails: None Entrance Stairs-Number of Steps: 1 Bathroom Shower/Tub: Chiropodist: Handicapped height Bathroom Accessibility: Yes  How Accessible: Accessible via walker Barre: No Additional Comments: Patient was using cane prior to admission due to prior CVA  Discharge Living Setting Plans for Discharge Living Setting: Patient's home Type of Home at Discharge: House Discharge Home Layout: One level Discharge Home Access: Stairs to enter [place], Number of Steps: 1 Discharge Bathroom Shower/Tub: Tub/shower unit Discharge Bathroom Toilet: Handicapped Height Discharge Bathroom Accessibility: Yes How Accessible: Accessible via walker Does the patient have any problems obtaining your medications?: No  Social/Family/Support Systems Patient Roles: Spouse Contact Information: [phone] Anticipated Caregiver: Travez Stancil Anticipated Caregiver's Contact Information: [phone] Caregiver Availability: Pt.'s wife states that while she plans to return to work in the fall, she will get family and caregivers to assist with 24/7 support.  Discharge Plan Discussed with Primary Caregiver: Yes   Goals Patient/Family Goal for Rehab: Min/Mod PT OT; Supervision SLP Expected length of stay: 20-24 days Pt/Family Agrees to Admission and willing to participate: Yes Program Orientation Provided & Reviewed with Pt/Caregiver Including Roles  & Responsibilities: Yes   Decrease burden of Care through IP rehab admission: Specialzed equipment needs and Patient/family education   Possible need for SNF placement upon discharge: Not anticipated   Patient Condition: This patient's medical and functional status has changed since the consult dated: 03/03/2020 in which the Rehabilitation Physician determined and documented that the patient's condition is appropriate for intensive rehabilitative care in an inpatient rehabilitation facility. See "History of Present Illness" (above) for medical update. Functional changes are: none. Patient's medical and functional status update has been discussed with the  Rehabilitation physician and patient remains appropriate for inpatient rehabilitation. Will admit to inpatient rehab today.  Preadmission Screen Completed By:  Raechel Ache, OT, with day of admit updates completed by Raechel Ache OTR/L on  03/06/2020 1:37 PM ______________________________________________________________________    Discussed status with Dr. Letta Pate on 03/06/20 at 1:36PM and received approval for admission today.  Admission Coordinator:  Genella Mech, with day of admit updates completed by Raechel Ache OTR/L  at time 1:36PM/Date 03/06/20.

## 2020-03-04 NOTE — Progress Notes (Signed)
HD#2 Subjective:  Overnight Events: Paged for elevated blood pressure overnight.   Kevin [name] was doing well today. Continue to show improvement. Was alert and oriented.   Objective:  Vital signs in last 24 hours: Vitals:   03/03/20 1959 03/04/20 0005 03/04/20 0356 03/04/20 0500  BP: (!) 196/100 (!) 191/117 (!) 210/107 (!) 224/110  Pulse: 88 97 99 99  Resp: 19 20 18 20   Temp: 98.1 F (36.7 C) 98.3 F (36.8 C) 98 F (36.7 C)   TempSrc: Oral Oral Oral   SpO2: 98% 98% 99%   Weight:      Height:       Supplemental O2: RA SpO2: 99 %   Physical Exam:  Physical Exam Constitutional:      Appearance: Normal appearance.  HENT:     Head: Normocephalic and atraumatic.  Eyes:     Extraocular Movements: Extraocular movements intact.  Cardiovascular:     Rate and Rhythm: Normal rate.     Pulses: Normal pulses.     Heart sounds: Normal heart sounds.  Pulmonary:     Effort: Pulmonary effort is normal.     Breath sounds: Normal breath sounds.  Abdominal:     General: Bowel sounds are normal.     Palpations: Abdomen is soft.     Tenderness: There is no abdominal tenderness.  Musculoskeletal:        General: Normal range of motion.     Cervical back: Normal range of motion.     Right lower leg: No edema.     Left lower leg: No edema.  Skin:    General: Skin is warm and dry.  Neurological:     Mental Status: He is alert and oriented to person, place, and time. Mental status is at baseline.     Cranial Nerves: Dysarthria (and expressive aphasia) present.     Motor: Weakness (RUE 2/5; RLE 3/5 with decrease sensaiton) present.     Coordination: Coordination is intact.  Psychiatric:        Mood and Affect: Mood normal.     Filed Weights   03/02/20 1955  Weight: 102.2 kg     Intake/Output Summary (Last 24 hours) at 03/04/2020 0610 Last data filed at 03/04/2020 0546 Gross per 24 hour  Intake 593.6 ml  Output 1400 ml  Net -806.4 ml   Net IO Since Admission:  -1,706.4 mL [03/04/20 0610]  Pertinent Labs: CBC Latest Ref Rng & Units 03/04/2020 03/03/2020 03/02/2020  WBC 4.0 - 10.5 K/uL 7.1 7.2 -  Hemoglobin 13.0 - 17.0 g/dL 15.2 14.9 15.0  Hematocrit 39 - 52 % 45.0 44.9 44.0  Platelets 150 - 400 K/uL 62(L) 63(L) -    CMP Latest Ref Rng & Units 03/04/2020 03/03/2020 03/02/2020  Glucose 70 - 99 mg/dL 129(H) 121(H) 197(H)  BUN 8 - 23 mg/dL 16 19 26(H)  Creatinine 0.61 - 1.24 mg/dL 1.29(H) 1.42(H) 2.00(H)  Sodium 135 - 145 mmol/L 138 136 139  Potassium 3.5 - 5.1 mmol/L 3.8 4.1 4.2  Chloride 98 - 111 mmol/L 109 110 109  CO2 22 - 32 mmol/L 19(L) 20(L) -  Calcium 8.9 - 10.3 mg/dL 8.4(L) 8.3(L) -  Total Protein 6.5 - 8.1 g/dL 6.6 - -  Total Bilirubin 0.3 - 1.2 mg/dL 1.5(H) - -  Alkaline Phos 38 - 126 U/L 52 - -  AST 15 - 41 U/L 28 - -  ALT 0 - 44 U/L 26 - -    Pending Labs:  none  Imaging: No results found.   Assessment/Plan:   Principal Problem:   Cerebrovascular accident (CVA) due to embolism of cerebral artery (HCC) Active Problems:   Acute focal neurological deficit  Patient Summary: [name] is a 69 y.o. with a pertinent PMH of HTN, CKD, CAD, CHF, A-fib, PAD, Cocaine use, Thrombocytopenia, Cirrhosis, CVA, Seizures, and GERD , who presented with focal neurologic defecits and admitted for acute CVA. He/she is on hospital day 1 and continues to have residual deficits.  Acute Embolic CVA: Strength and speech improving. Patient will likely need CIR and will hopefull be admitted early next week. - Restart carvedilol, slowly decrease BP over next several days.  - Continue statin  - Continue PT/OT - CIR early next week  HTN, CHF, CAD - Restart Carvedilol -Continue strict ins and outs and daily weights.  Permanent A-fib: - Continue Eliquis - Restart carvedilol  Cirrhosis, Thrombocytopenia:  -Has chronic thrombocytopenia otherwise no signs or symptoms of decompensation.  CKD: - Avoid nephrotoxic medications when  possible.  Diet: Heart Healthy IVF: None,None VTE: NOAC Code: Full PT/OT recs: CIR TOC recs: CIR   Dispo: Anticipated discharge to CIR.    Dellia Cloud, D.O. [facility], PGY-1 Date 03/04/2020 Time 6:10 AM Pager: [phone] Please contact the on call pager after 5 pm and on weekends at [phone].

## 2020-03-04 NOTE — Progress Notes (Signed)
Inpatient Rehab Admissions Coordinator:   Met with patient at bedside to discuss potential CIR admission. Pt. Stated interest. Will pursue for potential admit next week, pending bed availability. Called patient's wife to discuss program and potential support after discharge and left voicemail. Awaiting call back.   Clemens Catholic, [place], [place] Admissions Coordinator  [phone] ([place]) [phone] (office)

## 2020-03-04 NOTE — Progress Notes (Signed)
   03/04/20 0220  Provider Notification  Provider Name/Title Dr Ronelle Nigh  Date Provider Notified 03/04/20  Time Provider Notified 0211  Notification Type Page  Notification Reason Other (Comment) (Pt's BP HIGH 191/117 at midnight)  Response See new orders  Date of Provider Response 03/04/20  Time of Provider Response 0215     03/04/20 0220  Provider Notification  Provider Name/Title Dr Ronelle Nigh  Date Provider Notified 03/04/20  Time Provider Notified 0211  Notification Type Page  Notification Reason Other (Comment) (Pt's BP HIGH 191/117 at midnight)  Response See new orders  Date of Provider Response 03/04/20  Time of Provider Response [phone]

## 2020-03-04 NOTE — Progress Notes (Signed)
   03/04/20 0500  Provider Notification  Provider Name/Title Dr Ronelle Nigh  Date Provider Notified 03/04/20  Time Provider Notified [phone]  Notification Type Page  Notification Reason Other (Comment) (BP now 224/110)  Response See new orders  Date of Provider Response 03/04/20  Time of Provider Response [phone]

## 2020-03-04 NOTE — Progress Notes (Signed)
Physical Therapy Treatment Patient Details Name: Kevin [name] MRN: [id number] DOB: [birth year] Today's Date: 03/04/2020    History of Present Illness Kevin [name] is a 69 yo M with Hx of HTN, CKD, CAD, CHF, A-fib, PAD, Cocaine use, Thrombocytopenia, Cirrhosis, CVA, Seizures, and GERD who presented to the ED after waking up in the morning with right sided weakness, facial droop and slurred speech. He was found on the floor by his wife at around 530 am.    PT Comments    Patient seen for mobility progression. Session limited as pt's BP upon sitting was 216/135 (159). Pt requires mod A +2 for bed mobility and functional transfer training given R side hemiplegia. Pt following single step cues consistently this session and oriented X 4. Continue to recommend CIR for further skilled PT services to maximize independence and safety with mobility.    Follow Up Recommendations  CIR     Equipment Recommendations  Other (comment) (TBD)    Recommendations for Other Services Rehab consult     Precautions / Restrictions Precautions Precautions: Fall Restrictions: permissive hypertension 220/120 Weight Bearing Restrictions: No    Mobility  Bed Mobility Overal bed mobility: Needs Assistance Bed Mobility: Sit to Supine;Supine to Sit     Supine to sit: Mod assist;+2 for safety/equipment Sit to supine: Mod assist;+2 for physical assistance   General bed mobility comments: cues for sequencing and use of rials; assist to bring R LE and hips to EOB and to elevate trunk into sitting   Transfers Overall transfer level: Needs assistance Equipment used: 2 person hand held assist Transfers: Sit to/from Stand Sit to Stand: +2 physical assistance;Mod assist         General transfer comment: R knee blocked and R UE supported by therapist; +2 assist to power up into standing with facilitation of hip extension   Ambulation/Gait                 Stairs             Wheelchair  Mobility    Modified Rankin (Stroke Patients Only) Modified Rankin (Stroke Patients Only) Pre-Morbid Rankin Score: No significant disability Modified Rankin: Moderately severe disability     Balance Overall balance assessment: Needs assistance Sitting-balance support: Single extremity supported;Feet supported Sitting balance-Leahy Scale: Poor   Postural control: Right lateral lean Standing balance support: Bilateral upper extremity supported;During functional activity Standing balance-Leahy Scale: Zero                              Cognition Arousal/Alertness: Awake/alert Behavior During Therapy: WFL for tasks assessed/performed Overall Cognitive Status: Impaired/Different from baseline Area of Impairment: Problem solving;Awareness;Following commands;Safety/judgement;Memory                   Current Attention Level: Sustained Memory: Decreased short-term memory Following Commands: Follows one step commands consistently Safety/Judgement: Decreased awareness of safety;Decreased awareness of deficits Awareness: Intellectual Problem Solving: Slow processing;Difficulty sequencing;Requires verbal cues;Requires tactile cues        Exercises      General Comments        Pertinent Vitals/Pain Pain Assessment: No/denies pain    Home Living     Available Help at Discharge: Family Type of Home: House Home Access: Stairs to enter Entrance Stairs-Rails: None Home Layout: One level        Prior Function            PT Goals (current goals  can now be found in the care plan section) Progress towards PT goals: Progressing toward goals    Frequency    Min 4X/week      PT Plan Current plan remains appropriate    Co-evaluation              AM-PAC PT "6 Clicks" Mobility   Outcome Measure  Help needed turning from your back to your side while in a flat bed without using bedrails?: A Lot Help needed moving from lying on your back to  sitting on the side of a flat bed without using bedrails?: A Lot Help needed moving to and from a bed to a chair (including a wheelchair)?: A Lot Help needed standing up from a chair using your arms (e.g., wheelchair or bedside chair)?: A Lot Help needed to walk in hospital room?: Total Help needed climbing 3-5 steps with a railing? : Total 6 Click Score: 10    End of Session Equipment Utilized During Treatment: Gait belt Activity Tolerance: Other (comment) (SBP 216 with supine to sit) Patient left: in bed;with bed alarm set;with call bell/phone within reach Nurse Communication: Mobility status PT Visit Diagnosis: Other abnormalities of gait and mobility (R26.89);Muscle weakness (generalized) (M62.81);Hemiplegia and hemiparesis Hemiplegia - Right/Left: Right Hemiplegia - dominant/non-dominant: Non-dominant Hemiplegia - caused by: Cerebral infarction     Time: 6283-6629 PT Time Calculation (min) (ACUTE ONLY): 24 min  Charges:  $Therapeutic Activity: 23-37 mins                     Earney Navy, PTA Acute Rehabilitation Services Pager: [phone] Office: [phone]     Darliss Cheney 03/04/2020, 4:42 PM

## 2020-03-05 LAB — CBC
HCT: 44.4 % (ref 39.0–52.0)
Hemoglobin: 14.8 g/dL (ref 13.0–17.0)
MCH: 33.9 pg (ref 26.0–34.0)
MCHC: 33.3 g/dL (ref 30.0–36.0)
MCV: 101.6 fL — ABNORMAL HIGH (ref 80.0–100.0)
Platelets: 71 10*3/uL — ABNORMAL LOW (ref 150–400)
RBC: 4.37 MIL/uL (ref 4.22–5.81)
RDW: 11.6 % (ref 11.5–15.5)
WBC: 6.8 10*3/uL (ref 4.0–10.5)
nRBC: 0 % (ref 0.0–0.2)

## 2020-03-05 LAB — BASIC METABOLIC PANEL
Anion gap: 6 (ref 5–15)
BUN: 14 mg/dL (ref 8–23)
CO2: 22 mmol/L (ref 22–32)
Calcium: 8.5 mg/dL — ABNORMAL LOW (ref 8.9–10.3)
Chloride: 110 mmol/L (ref 98–111)
Creatinine, Ser: 1.34 mg/dL — ABNORMAL HIGH (ref 0.61–1.24)
GFR calc Af Amer: >60 mL/min (ref >60)
GFR calc non Af Amer: 54 mL/min — ABNORMAL LOW (ref >60)
Glucose, Bld: 129 mg/dL — ABNORMAL HIGH (ref 70–99)
Potassium: 4.1 mmol/L (ref 3.5–5.1)
Sodium: 138 mmol/L (ref 135–145)

## 2020-03-05 LAB — GLUCOSE, CAPILLARY: Glucose-Capillary: 136 mg/dL — ABNORMAL HIGH (ref 70–99)

## 2020-03-05 MED ORDER — LEVETIRACETAM 500 MG PO TABS
1000.0000 mg | ORAL_TABLET | Freq: Two times a day (BID) | ORAL | Status: DC
  Administered 2020-03-05 – 2020-03-06 (×2): 1000 mg via ORAL
  Filled 2020-03-05 (×2): qty 2

## 2020-03-05 NOTE — Progress Notes (Signed)
HD#3 Subjective:  Overnight Events: no overnight events  Kevin [name] was resting comfortably in bed. No new complaints today.   Objective:  Vital signs in last 24 hours: Vitals:   03/04/20 2000 03/04/20 2328 03/05/20 0411 03/05/20 0413  BP: (!) 176/104 (!) 190/96 (!) 189/100   Pulse: 84 88    Resp: 20 20 20    Temp: 98.2 F (36.8 C) 98 F (36.7 C) 97.8 F (36.6 C)   TempSrc: Oral Oral Oral   SpO2: 98% 97% 95%   Weight:    102.7 kg  Height:       Supplemental O2: RA SpO2: 95 %   Physical Exam:  Physical Exam Constitutional:      Appearance: Normal appearance.  HENT:     Head: Normocephalic and atraumatic.  Eyes:     Extraocular Movements: Extraocular movements intact.  Cardiovascular:     Rate and Rhythm: Normal rate.     Pulses: Normal pulses.     Heart sounds: Normal heart sounds.  Pulmonary:     Effort: Pulmonary effort is normal.     Breath sounds: Normal breath sounds.  Abdominal:     General: Bowel sounds are normal.     Palpations: Abdomen is soft.     Tenderness: There is no abdominal tenderness.  Musculoskeletal:        General: Normal range of motion.     Cervical back: Normal range of motion.     Right lower leg: No edema.     Left lower leg: No edema.  Skin:    General: Skin is warm and dry.  Neurological:     Mental Status: He is alert and oriented to person, place, and time.     Cranial Nerves: Dysarthria present.     Sensory: Sensory deficit (right sided.) present.     Comments: Neuro exam unchanged form yesterday. Upper and lower extremity weakness on right side. UE 2/5 and LE 3/5.   Psychiatric:        Mood and Affect: Mood normal.     Filed Weights   03/02/20 1955 03/05/20 0413  Weight: 102.2 kg 102.7 kg     Intake/Output Summary (Last 24 hours) at 03/05/2020 0521 Last data filed at 03/04/2020 1810 Gross per 24 hour  Intake 350 ml  Output 500 ml  Net -150 ml   Net IO Since Admission: -1,356.4 mL [03/05/20  0521]  Pertinent Labs: CBC Latest Ref Rng & Units 03/04/2020 03/03/2020 03/02/2020  WBC 4.0 - 10.5 K/uL 7.1 7.2 -  Hemoglobin 13.0 - 17.0 g/dL 15.2 14.9 15.0  Hematocrit 39 - 52 % 45.0 44.9 44.0  Platelets 150 - 400 K/uL 62(L) 63(L) -    CMP Latest Ref Rng & Units 03/04/2020 03/03/2020 03/02/2020  Glucose 70 - 99 mg/dL 129(H) 121(H) 197(H)  BUN 8 - 23 mg/dL 16 19 26(H)  Creatinine 0.61 - 1.24 mg/dL 1.29(H) 1.42(H) 2.00(H)  Sodium 135 - 145 mmol/L 138 136 139  Potassium 3.5 - 5.1 mmol/L 3.8 4.1 4.2  Chloride 98 - 111 mmol/L 109 110 109  CO2 22 - 32 mmol/L 19(L) 20(L) -  Calcium 8.9 - 10.3 mg/dL 8.4(L) 8.3(L) -  Total Protein 6.5 - 8.1 g/dL 6.6 - -  Total Bilirubin 0.3 - 1.2 mg/dL 1.5(H) - -  Alkaline Phos 38 - 126 U/L 52 - -  AST 15 - 41 U/L 28 - -  ALT 0 - 44 U/L 26 - -    Pending  Labs: none  Imaging: No results found.   Assessment/Plan:   Principal Problem:   Cerebrovascular accident (CVA) due to embolism of cerebral artery (HCC) Active Problems:   Acute focal neurological deficit   Patient Summary: Kevin Tangeman Robinsonis a 69 y.o.with a pertinent PMH of HTN, CKD, CAD, CHF, A-fib, PAD, Cocaine use, Thrombocytopenia, Cirrhosis, CVA, Seizures, and GERD, who presented withfocal neurologic defecitsand admitted for acute CVA. He/she is on hospital day1and continues to have residual deficits.  Acute Embolic CVA: -Continue carvedilol and will slowly add back his home medications to normalize his blood pressure over the next week. -Continue statin -Continue PT/OT -Likely discharge to CIR early next week.  HTN, CHF, CAD - Restart Carvedilol -Continue strict ins and outs and daily weights.  Permanent A-fib: - Continue Eliquis - Restart carvedilol  Cirrhosis, Thrombocytopenia:  -Has chronic thrombocytopenia otherwise no signs or symptoms of decompensation.  CKD: - Avoid nephrotoxic medications when possible.  Diet: Heart Healthy IVF: None,None VTE:  NOAC Code: Full PT/OT recs: CIR TOC recs: CIR   Dispo: Anticipated discharge pending CIR.    Dellia Cloud, D.O. [facility], PGY-1 Date 03/05/2020 Time 5:21 AM Pager: [phone] Please contact the on call pager after 5 pm and on weekends at [phone].

## 2020-03-06 ENCOUNTER — Inpatient Hospital Stay (HOSPITAL_COMMUNITY)
Admission: RE | Admit: 2020-03-06 | Discharge: 2020-03-28 | DRG: 057 | Disposition: A | Discharge status: Home / Self Care | Payer: Medicare HMO | Source: Intra-hospital | Attending: Physical Medicine & Rehabilitation | Admitting: Physical Medicine & Rehabilitation

## 2020-03-06 ENCOUNTER — Other Ambulatory Visit: Payer: Self-pay

## 2020-03-06 ENCOUNTER — Encounter (HOSPITAL_COMMUNITY): Payer: Self-pay | Admitting: Physical Medicine & Rehabilitation

## 2020-03-06 DIAGNOSIS — N319 Neuromuscular dysfunction of bladder, unspecified: Secondary | ICD-10-CM | POA: Diagnosis not present

## 2020-03-06 DIAGNOSIS — K5900 Constipation, unspecified: Secondary | ICD-10-CM | POA: Diagnosis present

## 2020-03-06 DIAGNOSIS — I2510 Atherosclerotic heart disease of native coronary artery without angina pectoris: Secondary | ICD-10-CM | POA: Diagnosis present

## 2020-03-06 DIAGNOSIS — R030 Elevated blood-pressure reading, without diagnosis of hypertension: Secondary | ICD-10-CM | POA: Diagnosis not present

## 2020-03-06 DIAGNOSIS — T502X5A Adverse effect of carbonic-anhydrase inhibitors, benzothiadiazides and other diuretics, initial encounter: Secondary | ICD-10-CM | POA: Diagnosis present

## 2020-03-06 DIAGNOSIS — K7460 Unspecified cirrhosis of liver: Secondary | ICD-10-CM | POA: Diagnosis present

## 2020-03-06 DIAGNOSIS — D696 Thrombocytopenia, unspecified: Secondary | ICD-10-CM | POA: Diagnosis present

## 2020-03-06 DIAGNOSIS — G40909 Epilepsy, unspecified, not intractable, without status epilepticus: Secondary | ICD-10-CM | POA: Diagnosis present

## 2020-03-06 DIAGNOSIS — I739 Peripheral vascular disease, unspecified: Secondary | ICD-10-CM | POA: Diagnosis present

## 2020-03-06 DIAGNOSIS — I6340 Cerebral infarction due to embolism of unspecified cerebral artery: Secondary | ICD-10-CM | POA: Diagnosis present

## 2020-03-06 DIAGNOSIS — K5901 Slow transit constipation: Secondary | ICD-10-CM | POA: Diagnosis not present

## 2020-03-06 DIAGNOSIS — R7309 Other abnormal glucose: Secondary | ICD-10-CM | POA: Diagnosis not present

## 2020-03-06 DIAGNOSIS — J45909 Unspecified asthma, uncomplicated: Secondary | ICD-10-CM | POA: Diagnosis present

## 2020-03-06 DIAGNOSIS — R0989 Other specified symptoms and signs involving the circulatory and respiratory systems: Secondary | ICD-10-CM

## 2020-03-06 DIAGNOSIS — F17210 Nicotine dependence, cigarettes, uncomplicated: Secondary | ICD-10-CM | POA: Diagnosis present

## 2020-03-06 DIAGNOSIS — G4733 Obstructive sleep apnea (adult) (pediatric): Secondary | ICD-10-CM | POA: Diagnosis present

## 2020-03-06 DIAGNOSIS — R339 Retention of urine, unspecified: Secondary | ICD-10-CM | POA: Diagnosis not present

## 2020-03-06 DIAGNOSIS — F1210 Cannabis abuse, uncomplicated: Secondary | ICD-10-CM | POA: Diagnosis present

## 2020-03-06 DIAGNOSIS — F1910 Other psychoactive substance abuse, uncomplicated: Secondary | ICD-10-CM

## 2020-03-06 DIAGNOSIS — N1832 Chronic kidney disease, stage 3b: Secondary | ICD-10-CM | POA: Diagnosis not present

## 2020-03-06 DIAGNOSIS — I69351 Hemiplegia and hemiparesis following cerebral infarction affecting right dominant side: Secondary | ICD-10-CM | POA: Diagnosis not present

## 2020-03-06 DIAGNOSIS — D7589 Other specified diseases of blood and blood-forming organs: Secondary | ICD-10-CM | POA: Diagnosis not present

## 2020-03-06 DIAGNOSIS — F1410 Cocaine abuse, uncomplicated: Secondary | ICD-10-CM | POA: Diagnosis present

## 2020-03-06 DIAGNOSIS — I5022 Chronic systolic (congestive) heart failure: Secondary | ICD-10-CM

## 2020-03-06 DIAGNOSIS — N183 Chronic kidney disease, stage 3 unspecified: Secondary | ICD-10-CM | POA: Diagnosis present

## 2020-03-06 DIAGNOSIS — I4819 Other persistent atrial fibrillation: Secondary | ICD-10-CM | POA: Diagnosis not present

## 2020-03-06 DIAGNOSIS — I130 Hypertensive heart and chronic kidney disease with heart failure and stage 1 through stage 4 chronic kidney disease, or unspecified chronic kidney disease: Secondary | ICD-10-CM | POA: Diagnosis not present

## 2020-03-06 DIAGNOSIS — I10 Essential (primary) hypertension: Secondary | ICD-10-CM | POA: Diagnosis not present

## 2020-03-06 DIAGNOSIS — D62 Acute posthemorrhagic anemia: Secondary | ICD-10-CM

## 2020-03-06 DIAGNOSIS — R569 Unspecified convulsions: Secondary | ICD-10-CM | POA: Diagnosis not present

## 2020-03-06 DIAGNOSIS — I4821 Permanent atrial fibrillation: Secondary | ICD-10-CM | POA: Diagnosis present

## 2020-03-06 DIAGNOSIS — R739 Hyperglycemia, unspecified: Secondary | ICD-10-CM

## 2020-03-06 DIAGNOSIS — G8191 Hemiplegia, unspecified affecting right dominant side: Secondary | ICD-10-CM

## 2020-03-06 LAB — COMPREHENSIVE METABOLIC PANEL
ALT: 19 U/L (ref 0–44)
AST: 30 U/L (ref 15–41)
Albumin: 3.1 g/dL — ABNORMAL LOW (ref 3.5–5.0)
Alkaline Phosphatase: 52 U/L (ref 38–126)
Anion gap: 10 (ref 5–15)
BUN: 16 mg/dL (ref 8–23)
CO2: 19 mmol/L — ABNORMAL LOW (ref 22–32)
Calcium: 8.4 mg/dL — ABNORMAL LOW (ref 8.9–10.3)
Chloride: 110 mmol/L (ref 98–111)
Creatinine, Ser: 1.37 mg/dL — ABNORMAL HIGH (ref 0.61–1.24)
GFR calc Af Amer: >60 mL/min (ref >60)
GFR calc non Af Amer: 53 mL/min — ABNORMAL LOW (ref >60)
Glucose, Bld: 112 mg/dL — ABNORMAL HIGH (ref 70–99)
Potassium: 4.3 mmol/L (ref 3.5–5.1)
Sodium: 139 mmol/L (ref 135–145)
Total Bilirubin: 1.4 mg/dL — ABNORMAL HIGH (ref 0.3–1.2)
Total Protein: 6.3 g/dL — ABNORMAL LOW (ref 6.5–8.1)

## 2020-03-06 LAB — CBC
HCT: 44.3 % (ref 39.0–52.0)
Hemoglobin: 14.7 g/dL (ref 13.0–17.0)
MCH: 33.7 pg (ref 26.0–34.0)
MCHC: 33.2 g/dL (ref 30.0–36.0)
MCV: 101.6 fL — ABNORMAL HIGH (ref 80.0–100.0)
Platelets: 76 10*3/uL — ABNORMAL LOW (ref 150–400)
RBC: 4.36 MIL/uL (ref 4.22–5.81)
RDW: 11.7 % (ref 11.5–15.5)
WBC: 7.9 10*3/uL (ref 4.0–10.5)
nRBC: 0 % (ref 0.0–0.2)

## 2020-03-06 MED ORDER — FUROSEMIDE 20 MG PO TABS
20.0000 mg | ORAL_TABLET | Freq: Every day | ORAL | Status: DC
  Administered 2020-03-07 – 2020-03-28 (×22): 20 mg via ORAL
  Filled 2020-03-06 (×22): qty 1

## 2020-03-06 MED ORDER — ALUM & MAG HYDROXIDE-SIMETH 200-200-20 MG/5ML PO SUSP: 30.0000 mL | ORAL | Status: DC | PRN

## 2020-03-06 MED ORDER — BISACODYL 10 MG RE SUPP
10.0000 mg | Freq: Every day | RECTAL | Status: DC | PRN
  Administered 2020-03-17: 10 mg via RECTAL
  Filled 2020-03-06 (×2): qty 1

## 2020-03-06 MED ORDER — SACUBITRIL-VALSARTAN 97-103 MG PO TABS
1.0000 | ORAL_TABLET | Freq: Two times a day (BID) | ORAL | Status: DC
  Administered 2020-03-06 – 2020-03-28 (×42): 1 via ORAL
  Filled 2020-03-06 (×45): qty 1

## 2020-03-06 MED ORDER — SENNA 8.6 MG PO TABS
2.0000 | ORAL_TABLET | Freq: Every day | ORAL | Status: DC
  Administered 2020-03-07: 17.2 mg via ORAL
  Filled 2020-03-06 (×3): qty 2

## 2020-03-06 MED ORDER — UMECLIDINIUM BROMIDE 62.5 MCG/INH IN AEPB
1.0000 | INHALATION_SPRAY | Freq: Every day | RESPIRATORY_TRACT | Status: DC
  Administered 2020-03-07 – 2020-03-28 (×19): 1 via RESPIRATORY_TRACT
  Filled 2020-03-06 (×3): qty 7

## 2020-03-06 MED ORDER — FLUTICASONE FUROATE-VILANTEROL 100-25 MCG/INH IN AEPB
1.0000 | INHALATION_SPRAY | Freq: Every day | RESPIRATORY_TRACT | Status: DC
  Administered 2020-03-07 – 2020-03-28 (×19): 1 via RESPIRATORY_TRACT
  Filled 2020-03-06 (×2): qty 28

## 2020-03-06 MED ORDER — TRAZODONE HCL 50 MG PO TABS
25.0000 mg | ORAL_TABLET | Freq: Every evening | ORAL | Status: DC | PRN
  Administered 2020-03-07: 25 mg via ORAL
  Administered 2020-03-08: 50 mg via ORAL
  Filled 2020-03-06 (×3): qty 1

## 2020-03-06 MED ORDER — POLYETHYLENE GLYCOL 3350 17 G PO PACK: 17.0000 g | PACK | Freq: Every day | ORAL | Status: DC | PRN

## 2020-03-06 MED ORDER — LEVETIRACETAM 500 MG PO TABS
1000.0000 mg | ORAL_TABLET | Freq: Two times a day (BID) | ORAL | Status: DC
  Administered 2020-03-06 – 2020-03-28 (×44): 1000 mg via ORAL
  Filled 2020-03-06 (×45): qty 2

## 2020-03-06 MED ORDER — BLOOD PRESSURE CONTROL BOOK
Freq: Once | Status: AC
  Filled 2020-03-06: qty 1

## 2020-03-06 MED ORDER — SPIRONOLACTONE 12.5 MG HALF TABLET
12.5000 mg | ORAL_TABLET | Freq: Once | ORAL | Status: AC
  Administered 2020-03-06: 12.5 mg via ORAL
  Filled 2020-03-06: qty 1

## 2020-03-06 MED ORDER — IPRATROPIUM-ALBUTEROL 0.5-2.5 (3) MG/3ML IN SOLN
3.0000 mL | Freq: Four times a day (QID) | RESPIRATORY_TRACT | Status: DC | PRN
  Administered 2020-03-07 – 2020-03-14 (×4): 3 mL via RESPIRATORY_TRACT
  Filled 2020-03-06 (×4): qty 3

## 2020-03-06 MED ORDER — PROCHLORPERAZINE MALEATE 5 MG PO TABS: 5.0000 mg | ORAL_TABLET | Freq: Four times a day (QID) | ORAL | Status: DC | PRN

## 2020-03-06 MED ORDER — CARVEDILOL 25 MG PO TABS
25.0000 mg | ORAL_TABLET | Freq: Two times a day (BID) | ORAL | Status: DC
  Administered 2020-03-07 – 2020-03-14 (×15): 25 mg via ORAL
  Filled 2020-03-06 (×15): qty 1

## 2020-03-06 MED ORDER — ACETAMINOPHEN 325 MG PO TABS: 325.0000 mg | ORAL_TABLET | ORAL | Status: DC | PRN

## 2020-03-06 MED ORDER — PROCHLORPERAZINE EDISYLATE 10 MG/2ML IJ SOLN: 5.0000 mg | Freq: Four times a day (QID) | INTRAMUSCULAR | Status: DC | PRN

## 2020-03-06 MED ORDER — FLEET ENEMA 7-19 GM/118ML RE ENEM: 1.0000 | ENEMA | Freq: Once | RECTAL | Status: DC | PRN

## 2020-03-06 MED ORDER — ATORVASTATIN CALCIUM 40 MG PO TABS
40.0000 mg | ORAL_TABLET | Freq: Every day | ORAL | Status: DC
  Administered 2020-03-07 – 2020-03-27 (×21): 40 mg via ORAL
  Filled 2020-03-06 (×23): qty 1

## 2020-03-06 MED ORDER — SACUBITRIL-VALSARTAN 97-103 MG PO TABS
1.0000 | ORAL_TABLET | Freq: Two times a day (BID) | ORAL | Status: DC
  Administered 2020-03-06: 1 via ORAL
  Filled 2020-03-06 (×2): qty 1

## 2020-03-06 MED ORDER — APIXABAN 5 MG PO TABS
5.0000 mg | ORAL_TABLET | Freq: Two times a day (BID) | ORAL | Status: DC
  Administered 2020-03-06 – 2020-03-28 (×44): 5 mg via ORAL
  Filled 2020-03-06 (×44): qty 1

## 2020-03-06 MED ORDER — FUROSEMIDE 20 MG PO TABS
20.0000 mg | ORAL_TABLET | Freq: Every day | ORAL | Status: DC
  Administered 2020-03-06: 20 mg via ORAL
  Filled 2020-03-06: qty 1

## 2020-03-06 MED ORDER — ATORVASTATIN CALCIUM 40 MG PO TABS: 40.0000 mg | ORAL_TABLET | Freq: Every day | ORAL | 0 refills | Status: DC

## 2020-03-06 MED ORDER — GUAIFENESIN-DM 100-10 MG/5ML PO SYRP: 5.0000 mL | ORAL_SOLUTION | Freq: Four times a day (QID) | ORAL | Status: DC | PRN

## 2020-03-06 MED ORDER — DIPHENHYDRAMINE HCL 12.5 MG/5ML PO ELIX: 12.5000 mg | ORAL_SOLUTION | Freq: Four times a day (QID) | ORAL | Status: DC | PRN

## 2020-03-06 MED ORDER — PROCHLORPERAZINE 25 MG RE SUPP: 12.5000 mg | Freq: Four times a day (QID) | RECTAL | Status: DC | PRN

## 2020-03-06 NOTE — Progress Notes (Signed)
Inpatient Rehabilitation-Admissions Coordinator   I have received insurance approval and medical clearance from attending service for admit to CIR today. Notified the pt and his wife of bed offer and they would like to proceed with CIR. I have reviewed insurance benefits letter and consent forms with the patient. All questions answered.  RN and [facility] team updated on plan for today.   Please call if questions.   Cheri Rous, OTR/L  Rehab Admissions Coordinator  [phone] 03/06/2020 1:23 PM

## 2020-03-06 NOTE — Progress Notes (Signed)
HD#4 Subjective:  Overnight Events: no events overnight  Kevin [name] is resting comfortably in bed eating breakfast. His speech and strength are improving, but he still feels like he is having trouble with word finding.  Objective:  Vital signs in last 24 hours: Vitals:   03/05/20 1928 03/06/20 0016 03/06/20 0410 03/06/20 0500  BP: (!) 143/91 (!) 151/115 (!) 182/102   Pulse: 76 77 81   Resp: 18 20 (!) 22   Temp: 98.6 F (37 C) (!) 97.5 F (36.4 C) 97.9 F (36.6 C)   TempSrc: Oral Oral Oral   SpO2: 94% 100% 98%   Weight:    102.2 kg  Height:       Supplemental O2: RA SpO2: 98 %   Physical Exam:  Physical Exam Constitutional:      Appearance: Normal appearance.  HENT:     Head: Normocephalic and atraumatic.  Eyes:     Extraocular Movements: Extraocular movements intact.  Cardiovascular:     Rate and Rhythm: Normal rate.     Pulses: Normal pulses.     Heart sounds: Normal heart sounds.  Pulmonary:     Effort: Pulmonary effort is normal.     Breath sounds: Normal breath sounds.  Abdominal:     General: Bowel sounds are normal.     Palpations: Abdomen is soft.     Tenderness: There is no abdominal tenderness.  Musculoskeletal:        General: Normal range of motion.     Cervical back: Normal range of motion.     Right lower leg: No edema.     Left lower leg: No edema.  Skin:    General: Skin is warm and dry.  Neurological:     Mental Status: He is alert and oriented to person, place, and time.     Cranial Nerves: Dysarthria present.     Motor: Weakness (improving) present.     Comments: Strength 3/5 Upper extremity, 3/5 lower extremity  Psychiatric:        Mood and Affect: Mood normal.     Filed Weights   03/02/20 1955 03/05/20 0413 03/06/20 0500  Weight: 102.2 kg 102.7 kg 102.2 kg     Intake/Output Summary (Last 24 hours) at 03/06/2020 0734 Last data filed at 03/06/2020 0500 Gross per 24 hour  Intake 150 ml  Output 1700 ml  Net -1550 ml     Net IO Since Admission: -2,906.4 mL [03/06/20 0734]  Pertinent Labs: CBC Latest Ref Rng & Units 03/06/2020 03/05/2020 03/04/2020  WBC 4.0 - 10.5 K/uL 7.9 6.8 7.1  Hemoglobin 13.0 - 17.0 g/dL 14.7 14.8 15.2  Hematocrit 39 - 52 % 44.3 44.4 45.0  Platelets 150 - 400 K/uL 76(L) 71(L) 62(L)    CMP Latest Ref Rng & Units 03/06/2020 03/05/2020 03/04/2020  Glucose 70 - 99 mg/dL 112(H) 129(H) 129(H)  BUN 8 - 23 mg/dL 16 14 16   Creatinine 0.61 - 1.24 mg/dL 1.37(H) 1.34(H) 1.29(H)  Sodium 135 - 145 mmol/L 139 138 138  Potassium 3.5 - 5.1 mmol/L 4.3 4.1 3.8  Chloride 98 - 111 mmol/L 110 110 109  CO2 22 - 32 mmol/L 19(L) 22 19(L)  Calcium 8.9 - 10.3 mg/dL 8.4(L) 8.5(L) 8.4(L)  Total Protein 6.5 - 8.1 g/dL 6.3(L) - 6.6  Total Bilirubin 0.3 - 1.2 mg/dL 1.4(H) - 1.5(H)  Alkaline Phos 38 - 126 U/L 52 - 52  AST 15 - 41 U/L 30 - 28  ALT 0 - 44  U/L 19 - 26    Pending Labs: none  Imaging: No results found.   Assessment/Plan:   Principal Problem:   Cerebrovascular accident (CVA) due to embolism of cerebral artery (HCC) Active Problems:   Acute focal neurological deficit   Patient Summary: Kevin Byers Robinsonis a 69 y.o.with a pertinent PMH of HTN, CKD, CAD, CHF, A-fib, PAD, Cocaine use, Thrombocytopenia, Cirrhosis, CVA, Seizures, and GERD, who presented withfocal neurologic defecitsand admitted for acute CVA. He/she is on hospital day1and continues to have residual right upper and lower extremity deficits and dysarthria.  AcuteEmbolicCVA: -Continue carvedilol and add back Entresto today for blood pressure contorl - continue statin - PT/OT - Discharge to CIR when available   HTN, CHF, CAD - Continue carvedilol, entresto, will start patient back on diuretics today - Restart Spirolactone and lasix -Continue strict ins and outs and daily weights.  Permanent A-fib: - Continue Eliquis  Cirrhosis, Thrombocytopenia:  -Has chronic thrombocytopenia otherwise no signs or symptoms  of decompensation.  CKD: - Avoid nephrotoxic medications when possible.  Diet:Heart Healthy EHU:DJSH,FWYO VZC:HYIF Code:Full PT/OT recs:CIR TOC recs:CIR   Dispo: Anticipated discharge pending CIR.    Dispo: Anticipated discharge pending cir placement.    Dellia Cloud, D.O. MCIM[facility], PGY-1 Date 03/06/2020 Time 7:34 AM Pager: [phone] Please contact the on call pager after 5 pm and on weekends at [phone].

## 2020-03-06 NOTE — Discharge Instructions (Signed)

## 2020-03-06 NOTE — Progress Notes (Signed)
Ranelle Oyster, MD  Physician  Physical Medicine and Rehabilitation  Consult Note      Addendum  Date of Service:  03/03/2020  2:09 PM      Related encounter: ED to Hosp-Admission (Discharged) from 03/02/2020 in [facility]      Expand All Collapse All  Show:Clear all [x] Manual[x] Template[] Copied  Added by: [x] Angiulli, Mcarthur Rossetti, PA-C[x] Ranelle Oyster, MD  [] Hover for details          Physical Medicine and Rehabilitation Consult Reason for Consult: Right side weakness with slurred speech Referring Physician: [facility]     HPI: Kevin [name] is a 69 y.o. right-handed male with history of hypertension, CKD, CAD, CHF, atrial fibrillation maintained on Eliquis, cocaine/alcohol/tobacco use.  Per chart review patient lives with spouse.  Independent with assistive device.  Two-level home bed and bath upstairs.  Presented 03/02/2020 with right side weakness and slurred speech as well as question seizure.  Cranial CT scan negative for acute findings.  Remote right occipital cortex infarct.  CT angiogram head and neck no emergent large vessel occlusion.  MRI showed numerous small acute cortical subcortical embolic type infarcts within the left cerebral hemisphere affecting the left ACA, MCA, PCA and watershed territories.  Multiple small acute infarcts also present within the left basal ganglia and left thalamus.  Echocardiogram with ejection fraction of 65% no wall motion abnormalities.  EEG negative for seizure.  Admission chemistries with glucose 197, BUN 27, creatinine 1.95, urine drug screen pending, alcohol negative.  Patient currently maintained on Eliquis as prior to admission.  Keppra added for seizure prophylaxis.  Tolerating a regular diet.  Therapy evaluations completed with recommendations of physical medicine rehab consult.     Review of Systems  Constitutional: Negative for chills and fever.  HENT: Negative for hearing loss.   Eyes: Negative for blurred  vision and double vision.  Respiratory: Positive for shortness of breath. Negative for cough.   Cardiovascular: Positive for palpitations and leg swelling. Negative for chest pain.  Gastrointestinal: Positive for constipation. Negative for heartburn, nausea and vomiting.  Genitourinary: Negative for dysuria, flank pain and hematuria.  Musculoskeletal: Positive for joint pain and myalgias.  Skin: Negative for rash.  Neurological: Positive for weakness.  All other systems reviewed and are negative.       Past Medical History:  Diagnosis Date  . Atrial fibrillation, permanent (HCC) 02/01/2016  . Blood transfusion without reported diagnosis    . CAD in native artery cath 2016 90% mRamus  02/01/2016  . CHF (congestive heart failure) (HCC)    . Cholelithiases 05/01/2012  . Claudication (HCC)      bilateral  . Cocaine abuse (HCC)    . Dysrhythmia      afib  . ETOH abuse    . Hepatitis C antibody test positive 04/2012  . Hyperpigmentation    . Hypertension    . PAD (peripheral artery disease) (HCC)    . Persistent atrial fibrillation (HCC) 08/23/2014  . Thrombocytopenia (HCC)    . Tobacco abuse           Past Surgical History:  Procedure Laterality Date  . Cardiolite        negative for ischemia  . CARDIOVASCULAR STRESS TEST      . CARDIOVERSION   05/05/2012    Procedure: CARDIOVERSION;  Surgeon: Thurmon Fair, MD;  Location: [facility] ENDOSCOPY;  Service: Cardiovascular;  Laterality: N/A;  . CAROTID ARTERY ANGIOPLASTY      . DOPPLER ECHOCARDIOGRAPHY      .  ENDOVASCULAR STENT INSERTION   right leg  . LEFT HEART CATHETERIZATION WITH CORONARY ANGIOGRAM N/A 05/01/2012    Procedure: LEFT HEART CATHETERIZATION WITH CORONARY ANGIOGRAM;  Surgeon: Marykay Lex, MD;  Location: [facility] CATH LAB;  Service: Cardiovascular;  Laterality: N/A;  . LEFT HEART CATHETERIZATION WITH CORONARY ANGIOGRAM N/A 09/27/2014    Procedure: LEFT HEART CATHETERIZATION WITH CORONARY ANGIOGRAM;  Surgeon: Chrystie Nose, MD;   Location: [facility] CATH LAB;  Service: Cardiovascular;  Laterality: N/A;  . TEE WITHOUT CARDIOVERSION   05/05/2012    Procedure: TRANSESOPHAGEAL ECHOCARDIOGRAM (TEE);  Surgeon: Thurmon Fair, MD;  Location: [facility] ENDOSCOPY;  Service: Cardiovascular;  Laterality: N/A;         Family History  Problem Relation Age of Onset  . Seizures Daughter    . Cancer Mother      Social History:  reports that he has been smoking cigarettes. He has a 10.00 pack-year smoking history. He has never used smokeless tobacco. He reports current alcohol use. He reports that he does not use drugs. Allergies: No Known Allergies       Medications Prior to Admission  Medication Sig Dispense Refill  . albuterol (VENTOLIN HFA) 108 (90 Base) MCG/ACT inhaler Inhale 2 puffs into the lungs every 6 (six) hours as needed for wheezing or shortness of breath. 18 g 5  . apixaban (ELIQUIS) 5 MG TABS tablet Take 1 tablet (5 mg total) by mouth 2 (two) times daily. 180 tablet 3  . buPROPion (WELLBUTRIN SR) 150 MG 12 hr tablet TAKE 1 TABLET BY MOUTH TWICE A DAY (Patient taking differently: Take 150 mg by mouth 2 (two) times daily. ) 60 tablet 0  . carvedilol (COREG) 25 MG tablet TAKE 1 TABLET BY MOUTH 2 TIMES DAILY WITH A MEAL. (Patient taking differently: Take 25 mg by mouth 2 (two) times daily with a meal. ) 60 tablet 2  . folic acid (FOLVITE) 1 MG tablet Take 1 tablet (1 mg total) by mouth daily. Need appointment before anymore refills 30 tablet 1  . Multiple Vitamin (MULTIVITAMIN WITH MINERALS) TABS tablet Take 1 tablet by mouth daily. Men's One a Day      . rosuvastatin (CRESTOR) 20 MG tablet Take 20 mg by mouth at bedtime.      . sacubitril-valsartan (ENTRESTO) 97-103 MG Take 1 tablet by mouth 2 (two) times daily. 60 tablet 2  . spironolactone (ALDACTONE) 25 MG tablet Take 0.5 tablets (12.5 mg total) by mouth daily. 45 tablet 3  . TRELEGY ELLIPTA 100-62.5-25 MCG/INH AEPB Take 1 puff by mouth daily.      . furosemide (LASIX) 40 MG tablet  Take 1 tablet (40 mg total) by mouth daily. (Patient not taking: Reported on 03/02/2020) 30 tablet 0  . levETIRAcetam (KEPPRA) 1000 MG tablet Take 1 tablet (1,000 mg total) by mouth 2 (two) times daily. (Patient not taking: Reported on 03/02/2020) 180 tablet 2  . pantoprazole (PROTONIX) 40 MG tablet Take 1 tablet (40 mg total) by mouth at bedtime. (Patient not taking: Reported on 03/02/2020) 90 tablet 3      Home: Home Living Family/patient expects to be discharged to:: Private residence Living Arrangements: Spouse/significant other Available Help at Discharge: Family, Available PRN/intermittently Type of Home: House Home Access: Stairs to enter Home Layout: Two level, Bed/bath upstairs Alternate Level Stairs-Rails: Right Bathroom Shower/Tub: Engineer, manufacturing systems: Standard Bathroom Accessibility: Yes Home Equipment: Cane - single point Additional Comments: Patient was using cane prior to admission due to prior CVA  Functional  History: Prior Function Level of Independence: Independent with assistive device(s) Comments: driving Functional Status:  Mobility: Bed Mobility Overal bed mobility: Needs Assistance Bed Mobility: Sit to Supine, Supine to Sit Supine to sit: Mod assist, +2 for physical assistance, +2 for safety/equipment Sit to supine: Mod assist, +2 for physical assistance, +2 for safety/equipment General bed mobility comments: patient required min assist to move R LE off bed, mod/max assist to raise trunk up to seated position. Transfers Overall transfer level: Needs assistance Equipment used: 2 person hand held assist Transfers: Sit to/from Stand Sit to Stand: +2 physical assistance, Mod assist General transfer comment: Patient unable to get fully standing at bedside x 2 attempts. R knee blocked and kept in optimal position to assist, but was unable to straighten it out all the way to support weight. Ambulation/Gait General Gait Details: unable at this time     ADL: ADL Overall ADL's : Needs assistance/impaired Grooming: Minimal assistance, Bed level Upper Body Bathing: Sitting, Cueing for safety, Maximal assistance Upper Body Bathing Details (indicate cue type and reason): max A due to lack of ROM/strength in RUE and impaired sitting balance Lower Body Bathing: Maximal assistance, Bed level, Cueing for sequencing Upper Body Dressing : Cueing for sequencing, Sitting, Maximal assistance Lower Body Dressing: Sit to/from stand, Total assistance, +2 for physical assistance Toilet Transfer Details (indicate cue type and reason): deferred due to safety Toileting- Clothing Manipulation and Hygiene: Cueing for safety, Cueing for sequencing, Sit to/from stand, Total assistance, +2 for physical assistance Tub/ Shower Transfer:  (deferred due to safety) Functional mobility during ADLs: Maximal assistance, +2 for physical assistance, +2 for safety/equipment General ADL Comments: Max A +2 due to R-side weakness, cognition, safety awareness, and balance   Cognition: Cognition Overall Cognitive Status: Impaired/Different from baseline Orientation Level: Oriented to person, Disoriented to place, Disoriented to time, Disoriented to situation Cognition Arousal/Alertness: Awake/alert Behavior During Therapy: WFL for tasks assessed/performed Overall Cognitive Status: Impaired/Different from baseline Area of Impairment: Orientation, Problem solving, Awareness, Following commands, Safety/judgement, Memory Orientation Level: Disoriented to, Time, Situation Current Attention Level: Sustained Memory: Decreased short-term memory Following Commands: Follows one step commands inconsistently, Follows one step commands with increased time Safety/Judgement: Decreased awareness of safety, Decreased awareness of deficits Awareness: Intellectual Problem Solving: Slow processing, Difficulty sequencing, Requires verbal cues, Requires tactile cues General Comments: Pt A&Ox2  and kept asking why his arm and leg didn't work after being told he had endured a stroke.  Requires cuing for safety. Poor awareness of deficits - pt says he can walk by himself.    Blood pressure (!) 216/105, pulse 100, temperature 98.1 F (36.7 C), temperature source Oral, resp. rate 18, height 6\' 1"  (1.854 m), weight 102.2 kg, SpO2 94 %. Physical Exam  Cardiovascular: Normal rate.  Respiratory: Effort normal.  Musculoskeletal:     Cervical back: Normal range of motion.  Neurological: He is alert.  Patient is alert in no acute distress.  Speech is a bit dysarthric but intelligible.  Follows commands.  Provides his name and age.  Limited medical historian. 0/5 RUE and RLE and grossly 4-5/5 LUE and LLE. Right inattention. Right central seven      Lab Results Last 24 Hours       Results for orders placed or performed during the hospital encounter of 03/02/20 (from the past 24 hour(s))  HIV Antibody (routine testing w rflx)     Status: None    Collection Time: 03/02/20  8:15 PM  Result Value Ref Range  HIV Screen 4th Generation wRfx Non Reactive Non Reactive  Basic metabolic panel     Status: Abnormal    Collection Time: 03/03/20  6:54 AM  Result Value Ref Range    Sodium 136 135 - 145 mmol/L    Potassium 4.1 3.5 - 5.1 mmol/L    Chloride 110 98 - 111 mmol/L    CO2 20 (L) 22 - 32 mmol/L    Glucose, Bld 121 (H) 70 - 99 mg/dL    BUN 19 8 - 23 mg/dL    Creatinine, Ser 9.37 (H) 0.61 - 1.24 mg/dL    Calcium 8.3 (L) 8.9 - 10.3 mg/dL    GFR calc non Af Amer 50 (L) >60 mL/min    GFR calc Af Amer 58 (L) >60 mL/min    Anion gap 6 5 - 15  CBC     Status: Abnormal    Collection Time: 03/03/20  6:54 AM  Result Value Ref Range    WBC 7.2 4.0 - 10.5 K/uL    RBC 4.39 4.22 - 5.81 MIL/uL    Hemoglobin 14.9 13.0 - 17.0 g/dL    HCT 90.2 39 - 52 %    MCV 102.3 (H) 80.0 - 100.0 fL    MCH 33.9 26.0 - 34.0 pg    MCHC 33.2 30.0 - 36.0 g/dL    RDW 40.9 73.5 - 32.9 %    Platelets 63 (L) 150 - 400  K/uL    nRBC 0.0 0.0 - 0.2 %       Imaging Results (Last 48 hours)  EEG   Result Date: 03/02/2020 Charlsie Quest, MD     03/02/2020 12:24 PM Patient Name: ONIS MARKOFF MRN: [id number] Epilepsy Attending: Charlsie Quest Referring Physician/Provider: Dr. Georgiana Spinner Aroor Date: 03/02/2020 Duration: 23.22 minutes Patient history: 69 year old male with history of seizures as well as stroke who presented with right-sided weakness.  EEG evaluate for seizures. Level of alertness: Awake, drowsy, sleep, comatose, lethargic AEDs during EEG study: Keppra Technical aspects: This EEG study was done with scalp electrodes positioned according to the 10-20 International system of electrode placement. Electrical activity was acquired at a sampling rate of 500Hz  and reviewed with a high frequency filter of 70Hz  and a low frequency filter of 1Hz . EEG data were recorded continuously and digitally stored. Description: The posterior dominant rhythm consists of 8-9 Hz activity of moderate voltage (25-35 uV) seen predominantly in posterior head regions, symmetric and reactive to eye opening and eye closing. EEG showed intermittent generalized and lateralized 3 to 6 Hz theta-delta slowing. Hyperventilation and photic stimulation were not performed.   ABNORMALITY -Intermittent slow, generalized and lateralized left hemisphere IMPRESSION: This study is suggestive of nonspecific cortical dysfunction in left hemisphere as well as mild diffuse encephalopathy, nonspecific to etiology. No seizures or epileptiform discharges were seen throughout the recording. Priyanka    CT ANGIO HEAD W OR WO CONTRAST   Result Date: 03/02/2020 CLINICAL DATA:  Stroke EXAM: CT ANGIOGRAPHY HEAD AND NECK CT PERFUSION BRAIN TECHNIQUE: Multidetector CT imaging of the head and neck was performed using the standard protocol during bolus administration of intravenous contrast. Multiplanar CT image reconstructions and MIPs were obtained to evaluate  the vascular anatomy. Carotid stenosis measurements (when applicable) are obtained utilizing NASCET criteria, using the distal internal carotid diameter as the denominator. Multiphase CT imaging of the brain was performed following IV bolus contrast injection. Subsequent parametric perfusion maps were calculated using RAPID software. CONTRAST:  OMNIPAQUE IOHEXOL  350 MG/ML SOLN COMPARISON:  Neck CTA 08/26/2015 FINDINGS: CTA NECK FINDINGS Aortic arch: Atherosclerotic plaque. No acute finding. Three vessel branching Right carotid system: Moderate mixed density plaque at the bifurcation. At the distal margin of the bulb is an anterior wall primarily calcified plaque which causes 30% narrowing. Left carotid system: Mixed density plaque at the bifurcation without flow reducing stenosis or ulceration. Vertebral arteries: No proximal subclavian stenosis or atherosclerosis. Calcified plaque at both V4 segments with assessment the left limited by intravenous contrast. There is likely high-grade left V1 segment stenosis. Otherwise the vertebral arteries are widely patent to the dura. Skeleton: Lower cervical spondylosis Other neck: No acute finding Upper chest: No acute finding Review of the MIP images confirms the above findings CTA HEAD FINDINGS Anterior circulation: Diffusely calcified carotid siphons. No branch occlusion, flow limiting stenosis, or beading. Atheromatous changes seen to the bilateral M1 and downstream branches. Negative for aneurysm. Posterior circulation: The vertebral and basilar arteries are widely patent. Atheromatous irregularity to the bilateral posterior cerebral arteries with up to moderate narrowing at the right P4 level and at the left P2 segment. Negative for aneurysm Venous sinuses: Unremarkable in the arterial phase Anatomic variants: None significant Review of the MIP images confirms the above findings CT Brain Perfusion Findings: ASPECTS: 10 CBF (<30%) Volume: 0mL Perfusion (Tmax>6.0s)  volume: 3mL in the right cerebral white matter-possibly noise given the setting of right-sided weakness. IMPRESSION: 1. No emergent large vessel occlusion. 2. Cervical carotid atherosclerosis without flow reducing stenosis or ulceration. 3. Atherosclerosis and potentially high-grade narrowing of the left V1 segment with assessment limited by artifact from intravenous contrast. 4. Intracranial atherosclerosis without flow reducing stenosis and major vessels. Electronically Signed   By: Marnee Spring M.D.   On: 03/02/2020 06:21    CT ANGIO NECK W OR WO CONTRAST   Result Date: 03/02/2020 CLINICAL DATA:  Stroke EXAM: CT ANGIOGRAPHY HEAD AND NECK CT PERFUSION BRAIN TECHNIQUE: Multidetector CT imaging of the head and neck was performed using the standard protocol during bolus administration of intravenous contrast. Multiplanar CT image reconstructions and MIPs were obtained to evaluate the vascular anatomy. Carotid stenosis measurements (when applicable) are obtained utilizing NASCET criteria, using the distal internal carotid diameter as the denominator. Multiphase CT imaging of the brain was performed following IV bolus contrast injection. Subsequent parametric perfusion maps were calculated using RAPID software. CONTRAST:  OMNIPAQUE IOHEXOL 350 MG/ML SOLN COMPARISON:  Neck CTA 08/26/2015 FINDINGS: CTA NECK FINDINGS Aortic arch: Atherosclerotic plaque. No acute finding. Three vessel branching Right carotid system: Moderate mixed density plaque at the bifurcation. At the distal margin of the bulb is an anterior wall primarily calcified plaque which causes 30% narrowing. Left carotid system: Mixed density plaque at the bifurcation without flow reducing stenosis or ulceration. Vertebral arteries: No proximal subclavian stenosis or atherosclerosis. Calcified plaque at both V4 segments with assessment the left limited by intravenous contrast. There is likely high-grade left V1 segment stenosis. Otherwise the  vertebral arteries are widely patent to the dura. Skeleton: Lower cervical spondylosis Other neck: No acute finding Upper chest: No acute finding Review of the MIP images confirms the above findings CTA HEAD FINDINGS Anterior circulation: Diffusely calcified carotid siphons. No branch occlusion, flow limiting stenosis, or beading. Atheromatous changes seen to the bilateral M1 and downstream branches. Negative for aneurysm. Posterior circulation: The vertebral and basilar arteries are widely patent. Atheromatous irregularity to the bilateral posterior cerebral arteries with up to moderate narrowing at the right P4 level and at  the left P2 segment. Negative for aneurysm Venous sinuses: Unremarkable in the arterial phase Anatomic variants: None significant Review of the MIP images confirms the above findings CT Brain Perfusion Findings: ASPECTS: 10 CBF (<30%) Volume: 0mL Perfusion (Tmax>6.0s) volume: 3mL in the right cerebral white matter-possibly noise given the setting of right-sided weakness. IMPRESSION: 1. No emergent large vessel occlusion. 2. Cervical carotid atherosclerosis without flow reducing stenosis or ulceration. 3. Atherosclerosis and potentially high-grade narrowing of the left V1 segment with assessment limited by artifact from intravenous contrast. 4. Intracranial atherosclerosis without flow reducing stenosis and major vessels. Electronically Signed   By: Marnee Spring M.D.   On: 03/02/2020 06:21    MR BRAIN WO CONTRAST   Result Date: 03/02/2020 CLINICAL DATA:  Focal neuro deficit, greater than 6 hours, stroke suspected. Additional history provided: History of hypertension, CKD, CAD, CHF, atrial fibrillation, P 80, cocaine use, thrombocytopenia, cirrhosis, CVA, seizures and GERD, presenting with right-sided weakness, facial droop and slurred speech. EXAM: MRI HEAD WITHOUT CONTRAST TECHNIQUE: Multiplanar, multiecho pulse sequences of the brain and surrounding structures were obtained without  intravenous contrast. COMPARISON:  Noncontrast head CT, CT angiogram head/neck and CT perfusion performed earlier the same day 03/02/2020, brain MRI 01/31/2016 FINDINGS: Brain: Mild intermittent motion degradation. There are numerous small embolic type acute cortical/subcortical infarcts within the left cerebral hemisphere predominantly affecting the left ACA, MCA and watershed territories. To a lesser degree, there are multiple acute cortical/subcortical infarcts within the left parietooccipital lobe in the left PCA territory. There also multiple small acute infarcts within the left basal ganglia and thalami. Corresponding T2/FLAIR hyperintensity at these sites. Redemonstrated chronic cortically based right occipital lobe infarct. Background moderate/advanced patchy T2/FLAIR hyperintensity within the cerebral white matter which is nonspecific, but consistent with chronic small vessel ischemic disease. To a lesser degree, chronic small vessel ischemic changes are present within the pons. Chronic lacunar infarcts within the deep gray nuclei are more numerous as compared to MRI 01/31/2016. There is also a chronic infarct within the paramedian right pons which was not present on this prior examination. Nonspecific chronic microhemorrhage within the right thalamus. Stable, moderate generalized parenchymal atrophy. No evidence of intracranial mass. No extra-axial fluid collection. No midline shift. Vascular: Expected proximal arterial flow voids. Skull and upper cervical spine: No focal marrow lesion. Sinuses/Orbits: Visualized orbits show no acute finding. Mild paranasal sinus mucosal thickening, most notably ethmoid. No significant mastoid effusion. IMPRESSION: Numerous small acute cortical/subcortical embolic-type infarcts within the left cerebral hemisphere affecting the left ACA, MCA, PCA and watershed territories. Multiple small acute infarcts are also present within the left basal ganglia and left thalamus.  Redemonstrated chronic cortically based right occipital lobe infarct. Background moderate to advanced chronic small vessel ischemic disease. Multiple chronic lacunar infarcts within the deep gray nuclei have increased in number as compared to MRI 01/31/2016. Additionally, there is a chronic infarct within the right pons which was not present on this prior examination. Moderate generalized parenchymal atrophy. Mild paranasal sinus mucosal thickening. Electronically Signed   By: Jackey Loge DO   On: 03/02/2020 14:39    CT CEREBRAL PERFUSION W CONTRAST   Result Date: 03/02/2020 CLINICAL DATA:  Stroke EXAM: CT ANGIOGRAPHY HEAD AND NECK CT PERFUSION BRAIN TECHNIQUE: Multidetector CT imaging of the head and neck was performed using the standard protocol during bolus administration of intravenous contrast. Multiplanar CT image reconstructions and MIPs were obtained to evaluate the vascular anatomy. Carotid stenosis measurements (when applicable) are obtained utilizing NASCET criteria, using the distal  internal carotid diameter as the denominator. Multiphase CT imaging of the brain was performed following IV bolus contrast injection. Subsequent parametric perfusion maps were calculated using RAPID software. CONTRAST:  128mL OMNIPAQUE IOHEXOL 350 MG/ML SOLN COMPARISON:  Neck CTA 08/26/2015 FINDINGS: CTA NECK FINDINGS Aortic arch: Atherosclerotic plaque. No acute finding. Three vessel branching Right carotid system: Moderate mixed density plaque at the bifurcation. At the distal margin of the bulb is an anterior wall primarily calcified plaque which causes 30% narrowing. Left carotid system: Mixed density plaque at the bifurcation without flow reducing stenosis or ulceration. Vertebral arteries: No proximal subclavian stenosis or atherosclerosis. Calcified plaque at both V4 segments with assessment the left limited by intravenous contrast. There is likely high-grade left V1 segment stenosis. Otherwise the vertebral  arteries are widely patent to the dura. Skeleton: Lower cervical spondylosis Other neck: No acute finding Upper chest: No acute finding Review of the MIP images confirms the above findings CTA HEAD FINDINGS Anterior circulation: Diffusely calcified carotid siphons. No branch occlusion, flow limiting stenosis, or beading. Atheromatous changes seen to the bilateral M1 and downstream branches. Negative for aneurysm. Posterior circulation: The vertebral and basilar arteries are widely patent. Atheromatous irregularity to the bilateral posterior cerebral arteries with up to moderate narrowing at the right P4 level and at the left P2 segment. Negative for aneurysm Venous sinuses: Unremarkable in the arterial phase Anatomic variants: None significant Review of the MIP images confirms the above findings CT Brain Perfusion Findings: ASPECTS: 10 CBF (<30%) Volume: 69mL Perfusion (Tmax>6.0s) volume: 74mL in the right cerebral white matter-possibly noise given the setting of right-sided weakness. IMPRESSION: 1. No emergent large vessel occlusion. 2. Cervical carotid atherosclerosis without flow reducing stenosis or ulceration. 3. Atherosclerosis and potentially high-grade narrowing of the left V1 segment with assessment limited by artifact from intravenous contrast. 4. Intracranial atherosclerosis without flow reducing stenosis and major vessels. Electronically Signed   By: Monte Fantasia M.D.   On: 03/02/2020 06:21    ECHOCARDIOGRAM COMPLETE   Result Date: 03/02/2020    ECHOCARDIOGRAM REPORT   Patient Name:   JAYLIN BENZEL Date of Exam: 03/02/2020 Medical Rec #:  [id number]         Height:       73.0 in Accession #:    [id number]        Weight:       227.8 lb Date of Birth:  [birth year]         BSA:          2.274 m Patient Age:    45 years          BP:           139/110 mmHg Patient Gender: M                 HR:           74 bpm. Exam Location:  Inpatient Procedure: 2D Echo and Intracardiac Opacification Agent  Indications:    Stroke 434.91 / I163.9                 Congestive Heart Failure 428.0 / I50.9  History:        Patient has prior history of Echocardiogram examinations, most                 recent 03/06/2016. CAD, Stroke and PAD, Arrythmias:Atrial                 Fibrillation; Risk Factors:Hypertension. Chronic kidney disease,  Cocaine use, Thrombocytopenia, Cirrhosis, GERD.  Sonographer:    Leta Jungling RDCS Referring Phys: [id number] NISCHAL NARENDRA IMPRESSIONS  1. Left ventricular ejection fraction, by estimation, is 60 to 65%. The left ventricle has normal function. The left ventricle has no regional wall motion abnormalities. There is severe concentric left ventricular hypertrophy. Left ventricular diastolic  parameters are indeterminate. Elevated left ventricular end-diastolic pressure.  2. Right ventricular systolic function is normal. The right ventricular size is normal. There is normal pulmonary artery systolic pressure.  3. Left atrial size was severely dilated.  4. The mitral valve is normal in structure. Trivial mitral valve regurgitation. No evidence of mitral stenosis.  5. The aortic valve is normal in structure. Aortic valve regurgitation is not visualized. No aortic stenosis is present.  6. The inferior vena cava is normal in size with greater than 50% respiratory variability, suggesting right atrial pressure of 3 mmHg. FINDINGS  Left Ventricle: Left ventricular ejection fraction, by estimation, is 60 to 65%. The left ventricle has normal function. The left ventricle has no regional wall motion abnormalities. Definity contrast agent was given IV to delineate the left ventricular  endocardial borders. The left ventricular internal cavity size was normal in size. There is severe concentric left ventricular hypertrophy. Left ventricular diastolic parameters are indeterminate. Elevated left ventricular end-diastolic pressure. Right Ventricle: The right ventricular size is normal. No  increase in right ventricular wall thickness. Right ventricular systolic function is normal. There is normal pulmonary artery systolic pressure. The tricuspid regurgitant velocity is 1.63 m/s, and  with an assumed right atrial pressure of 3 mmHg, the estimated right ventricular systolic pressure is 13.6 mmHg. Left Atrium: Left atrial size was severely dilated. Right Atrium: Right atrial size was normal in size. Pericardium: There is no evidence of pericardial effusion. Mitral Valve: The mitral valve is normal in structure. Normal mobility of the mitral valve leaflets. Trivial mitral valve regurgitation. No evidence of mitral valve stenosis. Tricuspid Valve: The tricuspid valve is normal in structure. Tricuspid valve regurgitation is trivial. No evidence of tricuspid stenosis. Aortic Valve: The aortic valve is normal in structure.. There is mild thickening and mild calcification of the aortic valve. Aortic valve regurgitation is not visualized. No aortic stenosis is present. There is mild thickening of the aortic valve. There is mild calcification of the aortic valve. Pulmonic Valve: The pulmonic valve was normal in structure. Pulmonic valve regurgitation is not visualized. No evidence of pulmonic stenosis. Aorta: The aortic root is normal in size and structure. Venous: The inferior vena cava is normal in size with greater than 50% respiratory variability, suggesting right atrial pressure of 3 mmHg. IAS/Shunts: No atrial level shunt detected by color flow Doppler.  LEFT VENTRICLE PLAX 2D LVIDd:         3.66 cm     Diastology LVIDs:         2.29 cm     LV e' lateral:   6.60 cm/s LV PW:         1.59 cm     LV E/e' lateral: 12.1 LV IVS:        1.83 cm     LV e' medial:    4.76 cm/s LVOT diam:     2.40 cm     LV E/e' medial:  16.8 LV SV:         44 LV SV Index:   19 LVOT Area:     4.52 cm  LV Volumes (MOD) LV vol d, MOD A2C: 52.9 ml  LV vol d, MOD A4C: 64.7 ml LV vol s, MOD A2C: 28.5 ml LV vol s, MOD A4C: 35.8 ml LV SV  MOD A2C:     24.4 ml LV SV MOD A4C:     64.7 ml LV SV MOD BP:      26.4 ml LEFT ATRIUM              Index LA diam:        4.30 cm  1.89 cm/m LA Vol (A2C):   50.9 ml  22.38 ml/m LA Vol (A4C):   103.0 ml 45.29 ml/m LA Biplane Vol: 78.3 ml  34.43 ml/m  AORTIC VALVE LVOT Vmax:   64.95 cm/s LVOT Vmean:  45.250 cm/s LVOT VTI:    0.097 m  AORTA Ao Root diam: 3.10 cm MITRAL VALVE               TRICUSPID VALVE MV Area (PHT): 5.97 cm    TR Peak grad:   10.6 mmHg MV Decel Time: 127 msec    TR Vmax:        163.00 cm/s MV E velocity: 79.95 cm/s                            SHUNTS                            Systemic VTI:  0.10 m                            Systemic Diam: 2.40 cm Chilton Si MD Electronically signed by Chilton Si MD Signature Date/Time: 03/02/2020/2:53:05 PM    Final     CT HEAD CODE STROKE WO CONTRAST   Result Date: 03/02/2020 CLINICAL DATA:  Code stroke. Right-sided weakness and slurred speech EXAM: CT HEAD WITHOUT CONTRAST TECHNIQUE: Contiguous axial images were obtained from the base of the skull through the vertex without intravenous contrast. COMPARISON:  02/04/2016 FINDINGS: Brain: No evidence of acute infarction, hemorrhage, hydrocephalus, extra-axial collection or mass lesion/mass effect. Moderate remote right occipital infarct, cortically based. Small remote appearing bilateral deep gray nuclei infarctions. Chronic small vessel ischemia in the cerebral white matter. Vascular: Atherosclerotic calcification.  No hyperdense vessel. Skull: Enlarged by parietal foramina Sinuses/Orbits: Negative Other: These results were communicated to Dr. Laurence Slate at 5:58 amon 6/10/2021by text page via the [facility] messaging system. ASPECTS [facility] Stroke Program Early CT Score) - Ganglionic level infarction (caudate, lentiform nuclei, internal capsule, insula, M1-M3 cortex): 7 - Supraganglionic infarction (M4-M6 cortex): 3 Total score (0-10 with 10 being normal): 10 IMPRESSION: 1. No acute finding 2. Chronic  small vessel ischemia with chronic appearing lacunar infarcts. 3. Remote right occipital cortex infarct. Electronically Signed   By: Marnee Spring M.D.   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 Does the need for close, 24 hr/day medical supervision in concert with the patient's rehab needs make it unreasonable for this patient to be served in a less intensive setting? Yes 2. Co-Morbidities requiring supervision/potential complications: CKD, CAD, CHF, afib 3. Due to bladder management, bowel management, safety, skin/wound care, disease management, medication administration, pain management and patient education, does the patient require 24 hr/day rehab nursing? Yes 4. Does the patient require coordinated care of a physician, rehab nurse, therapy disciplines of PT, OT, SLP  to address physical and functional deficits in the context of the above medical diagnosis(es)? Yes Addressing deficits in the following areas: balance, endurance, locomotion, strength, transferring, bowel/bladder control, bathing, dressing, feeding, grooming, toileting, cognition, speech, swallowing and psychosocial support 5. Can the patient actively participate in an intensive therapy program of at least 3 hrs of therapy per day at least 5 days per week? Yes 6. The potential for patient to make measurable gains while on inpatient rehab is excellent 7. Anticipated functional outcomes upon discharge from inpatient rehab are min assist and mod assist  with PT, min assist and mod assist with OT, supervision with SLP. 8. Estimated rehab length of stay to reach the above functional goals is: 21-28 days 9. Anticipated discharge destination: Home 10. Overall Rehab/Functional Prognosis: excellent   RECOMMENDATIONS: This patient's condition is appropriate for continued rehabilitative care in the following setting: CIR Patient has agreed to participate in recommended  program. Yes Note that insurance prior authorization may be required for reimbursement for recommended care.   Comment: Rehab Admissions Coordinator to follow up.   Thanks,   Ranelle Oyster, MD, FAAPMR   I have examined pertinent labs and radiographic images. I have reviewed and concur with the physician assistant's documentation above.     Charlton Amor, PA-C 03/03/2020    Revision History                          Routing History                Note Details  Author Ranelle Oyster, MD File Time 03/03/2020  2:49 PM  Author Type Physician Status Addendum  Last Editor Ranelle Oyster, MD Service Physical Medicine and Rehabilitation  [facility] Acct # [id number] Admit Date 03/06/2020

## 2020-03-06 NOTE — IPOC Note (Signed)
Individualized overall Plan of Care (IPOC) Patient Details Name: Kevin [name] MRN: [id number] DOB: 09/01/1951  Admitting Diagnosis: Cerebrovascular accident (CVA) due to embolism of cerebral artery [facility])  Hospital Problems: Principal Problem:   Cerebrovascular accident (CVA) due to embolism of cerebral artery ([place]) Active Problems:   Embolic stroke ([place])   Slow transit constipation   Chronic systolic congestive heart failure ([place])   Benign essential HTN      Functional Problem List: Nursing Behavior, Bladder, Bowel, Edema, Endurance, Medication Management, Safety  PT Balance, Endurance, Motor, Perception, Safety  OT Balance, Cognition, Endurance, Motor, Perception, Safety, Sensory, Vision  SLP Cognition  TR         Basic ADL's: OT Eating, Grooming, Bathing, Dressing, Toileting     Advanced  ADL's: OT       Transfers: PT Bed Mobility, Bed to Chair, Car, Manufacturing systems engineer, Metallurgist: PT Ambulation, Emergency planning/management officer, Stairs     Additional Impairments: OT Fuctional Use of Upper Extremity  SLP Social Cognition, Communication comprehension Memory, Problem Solving, Awareness  TR      Anticipated Outcomes Item Anticipated Outcome  Self Feeding setup  Swallowing      Basic self-care  min assist  Toileting  min assist   Bathroom Transfers CGA  Bowel/Bladder  manage bowel and bladder with min Assist  Transfers  MinA, LRAD  Locomotion  Min-ModA LRAD  Communication  Supervision A  Cognition  Min A  Pain  pain level less than 4 on scale of 0-10  Safety/Judgment  remain free of injury, prevent falls with cues and reminders   Therapy Plan: PT Intensity: Minimum of 1-2 x/day ,45 to 90 minutes PT Frequency: Total of 15 hours over 7 days of combined therapies PT Duration Estimated Length of Stay: 3-4 weeks OT Frequency: Total of 15 hours over 7 days of combined therapies OT Duration/Estimated Length of Stay: 3-4 weeks SLP  Intensity: Minumum of 1-2 x/day, 30 to 90 minutes SLP Frequency: 3 to 5 out of 7 days SLP Duration/Estimated Length of Stay: 3-4 weeks    Team Interventions: Nursing Interventions Patient/Family Education, Pain Management, Bladder Management, Medication Management, Discharge Planning, Skin Care/Wound Management, Psychosocial Support, Bowel Management, Cognitive Remediation/Compensation, Disease Management/Prevention  PT interventions Ambulation/gait training, Discharge planning, Functional mobility training, Psychosocial support, Therapeutic Activities, Visual/perceptual remediation/compensation, Balance/vestibular training, Disease management/prevention, Neuromuscular re-education, Skin care/wound management, Therapeutic Exercise, Wheelchair propulsion/positioning, Cognitive remediation/compensation, Pain management, DME/adaptive equipment instruction, Splinting/orthotics, UE/LE Strength taining/ROM, Community reintegration, Technical sales engineer stimulation, Patient/family education, IT trainer, UE/LE Coordination activities  OT Interventions Training and development officer, Discharge planning, Self Care/advanced ADL retraining, Therapeutic Activities, UE/LE Coordination activities, Cognitive remediation/compensation, Functional mobility training, Patient/family education, Therapeutic Exercise, Visual/perceptual remediation/compensation, DME/adaptive equipment instruction, Neuromuscular re-education, UE/LE Strength taining/ROM, Wheelchair propulsion/positioning  SLP Interventions Cognitive remediation/compensation, English as a second language teacher, Functional tasks, Patient/family education, Therapeutic Activities, Internal/external aids, Multimodal communication approach, Speech/Language facilitation  TR Interventions    SW/CM Interventions Discharge Planning, Psychosocial Support, Patient/Family Education   Barriers to Discharge MD  Medical stability and Weight  Nursing      PT Behavior cognition, fatigue  OT       SLP      SW Medical stability, Lack of/limited family support spouse returning to work in fall   Team Discharge Planning: Destination: PT-Home ,OT- Home , SLP-Home Projected Follow-up: PT-Home health PT, 24 hour supervision/assistance, OT-  Home health OT, SLP-Home Health SLP, 24 hour supervision/assistance Projected Equipment Needs: PT-To be determined, OT- To be determined, SLP-None recommended  by SLP Equipment Details: PT- , OT-  Patient/family involved in discharge planning: PT- Patient,  OT-Patient, SLP-Patient unable/family or caregive not available  MD ELOS: 24-27 days. Medical Rehab Prognosis:  Good Assessment: HPI: Kevin [name] is a 69 year old LH-male with history HTN, CAD w/chronic systolic CHF, CKD- SCr 1.9?, cirrhosis of the liver with chronic thrombocytopenia, polysubstance abuse, multifactorial SOB, CAF - on Eliquis, prior CVA/TIA, h/o seizures who was admitted on 03/02/20 after fall off the bed with slurred speech and noted to have right sided weakness at admission.  CT head negative for bleed. CTA head/CT perfusion negative for LVO or perfusion deficits. He was loaded with Keppra due to concerns of Todd's paralysis. Right brain done revealing numerous acute cortical/subcortical embolic infarcts in Left ACA, MCA, PCA and watershed territories as well as multiple small acute infarcts in left basal ganglia and left thalamus with moderate generalized atrophy.  2D echo showed EF 60-65% with severe concentric LVH and normal LVH. EEG with mild slowing and no seizure activity.  UDS positive for cocaine and THC. Dr. Pearlean Brownie recommended contiuing Eliquis as well as risk factor modification. Patient continues to be limited by right sided weakness with poor safety awareness and decrease in problem solving affecting mobility and ADLs. Will set goals for Supervision/Min A with PT/OT.    Due to the current state of emergency, patients may not be receiving their 3-hours of  Medicare-mandated therapy.  See Team Conference Notes for weekly updates to the plan of care

## 2020-03-06 NOTE — Discharge Summary (Signed)
Name: Kevin [name] MRN: [id number] DOB: 1950/11/17 69 y.o. PCP: Trey Sailors, PA  Date of Admission: 03/02/2020  5:43 AM Date of Discharge:  Attending Physician: Aldine Contes, MD  Discharge Diagnosis: 1. Embolic CVA 2. Atrial Fibrillation  3. HTN 4. HLD 5. Cirrhosis   Discharge Medications: Allergies as of 03/06/2020   No Known Allergies     Medication List    STOP taking these medications   furosemide 40 MG tablet Commonly known as: LASIX   levETIRAcetam 1000 MG tablet Commonly known as: KEPPRA   pantoprazole 40 MG tablet Commonly known as: PROTONIX   rosuvastatin 20 MG tablet Commonly known as: CRESTOR     TAKE these medications   albuterol 108 (90 Base) MCG/ACT inhaler Commonly known as: VENTOLIN HFA Inhale 2 puffs into the lungs every 6 (six) hours as needed for wheezing or shortness of breath.   apixaban 5 MG Tabs tablet Commonly known as: Eliquis Take 1 tablet (5 mg total) by mouth 2 (two) times daily.   atorvastatin 40 MG tablet Commonly known as: LIPITOR Take 1 tablet (40 mg total) by mouth daily at 6 PM.   buPROPion 150 MG 12 hr tablet Commonly known as: WELLBUTRIN SR TAKE 1 TABLET BY MOUTH TWICE A DAY   carvedilol 25 MG tablet Commonly known as: COREG TAKE 1 TABLET BY MOUTH 2 TIMES DAILY WITH A MEAL. What changed: See the new instructions.   Entresto 97-103 MG Generic drug: sacubitril-valsartan Take 1 tablet by mouth 2 (two) times daily.   folic acid 1 MG tablet Commonly known as: FOLVITE Take 1 tablet (1 mg total) by mouth daily. Need appointment before anymore refills   multivitamin with minerals Tabs tablet Take 1 tablet by mouth daily. Men's One a Day   spironolactone 25 MG tablet Commonly known as: ALDACTONE Take 0.5 tablets (12.5 mg total) by mouth daily.   Trelegy Ellipta 100-62.5-25 MCG/INH Aepb Generic drug: Fluticasone-Umeclidin-Vilant Take 1 puff by mouth daily.       Disposition and follow-up:     Kevin [name] was discharged from [facility] in Stable condition.  At the hospital follow up visit please address:  1.  Follow up:  A. CVA - make sure patient is taking statin and eliquis  B. Heart failure - make sure patient has been restarted on his HF medications and doses are titrated.   C. HTN - make sure to tartrate patient's HTN medications.  D. Cirrhosis - make sure he has a follow up with a GI physician.   E. Smoking - please counsel on smoking cessation   2.  Labs / imaging needed at time of follow-up: CBC and CMP  3.  Pending labs/ test needing follow-up: none  Follow-up Appointments:  Follow-up Information    Trey Sailors, PA Follow up.   Specialty: Physician Assistant Why: CAll to make an appointment with 1 week off discharge.  Contact information: [place] [address] [phone]               Hospital Course by problem list: Mr Kevin [name] is a 69 yo M with Hx of HTN, CKD, CAD, CHF, A-fib, PAD, Cocaine use, Thrombocytopenia, Cirrhosis, CVA, Seizures, and GERD who presented to the ED after waking up in the morning with right sided weakness, facial droop and slurred speech.  1. CVA - presented to the ED after waking up in the morning with right sided weakness, facial droop and slurred speech.  CT angiogram was negative for large vessel occlusion and a CT perfusion did not show obvious perfusion deficit. MRI showed numerous small acute cortical/subcortical embolic-type infarcts within the left cerebral hemisphere affecting the left ACA, MCA, PCA and watershed territories. Stroke thought to be secondary to embolic etiology.  Patient was already on eliquis for his atrial fibrillation and no TPA was given. Echo was performed wihtout source of embolus seen. Patient has had some improvement of his right sided weakness and dysarthria since admission. He was discharged to CIR.   Discharge Vitals:   BP (!) (P) 138/112 (BP  Location: Right Arm)   Pulse (P) 89   Temp (P) 98.6 F (37 C) (Oral)   Resp (P) 17   Ht 6\' 1"  (1.854 m)   Wt 102.2 kg   SpO2 97%   BMI 29.73 kg/m   Pertinent Labs, Studies, and Procedures:  CBC Latest Ref Rng & Units 03/06/2020 03/05/2020 03/04/2020  WBC 4.0 - 10.5 K/uL 7.9 6.8 7.1  Hemoglobin 13.0 - 17.0 g/dL 05/04/2020 80.9 98.3  Hematocrit 39 - 52 % 44.3 44.4 45.0  Platelets 150 - 400 K/uL 76(L) 71(L) 62(L)   CMP Latest Ref Rng & Units 03/06/2020 03/05/2020 03/04/2020  Glucose 70 - 99 mg/dL 05/04/2020) 505(L) 976(B)  BUN 8 - 23 mg/dL 16 14 16   Creatinine 0.61 - 1.24 mg/dL 341(P) ) 3.79(K)  Sodium 135 - 145 mmol/L 139 138 138  Potassium 3.5 - 5.1 mmol/L 4.3 4.1 3.8  Chloride 98 - 111 mmol/L 110 110 109  CO2 22 - 32 mmol/L 19(L) 22 19(L)  Calcium 8.9 - 10.3 mg/dL 2.40(X) 7.35(H) 2.9(J)  Total Protein 6.5 - 8.1 g/dL 6.3(L) - 6.6  Total Bilirubin 0.3 - 1.2 mg/dL 2.4(Q) - 1.5(H)  Alkaline Phos 38 - 126 U/L 52 - 52  AST 15 - 41 U/L 30 - 28  ALT 0 - 44 U/L 19 - 26   MR BRAIN WO CONTRAST  Result Date: 03/02/2020 CLINICAL DATA:  Focal neuro deficit, greater than 6 hours, stroke suspected. Additional history provided: History of hypertension, CKD, CAD, CHF, atrial fibrillation, P 80, cocaine use, thrombocytopenia, cirrhosis, CVA, seizures and GERD, presenting with right-sided weakness, facial droop and slurred speech. EXAM: MRI HEAD WITHOUT CONTRAST TECHNIQUE: Multiplanar, multiecho pulse sequences of the brain and surrounding structures were obtained without intravenous contrast. COMPARISON:  Noncontrast head CT, CT angiogram head/neck and CT perfusion performed earlier the same day 03/02/2020, brain MRI 01/31/2016 FINDINGS: Brain: Mild intermittent motion degradation. There are numerous small embolic type acute cortical/subcortical infarcts within the left cerebral hemisphere predominantly affecting the left ACA, MCA and watershed territories. To a lesser degree, there are multiple acute  cortical/subcortical infarcts within the left parietooccipital lobe in the left PCA territory. There also multiple small acute infarcts within the left basal ganglia and thalami. Corresponding T2/FLAIR hyperintensity at these sites. Redemonstrated chronic cortically based right occipital lobe infarct. Background moderate/advanced patchy T2/FLAIR hyperintensity within the cerebral white matter which is nonspecific, but consistent with chronic small vessel ischemic disease. To a lesser degree, chronic small vessel ischemic changes are present within the pons. Chronic lacunar infarcts within the deep gray nuclei are more numerous as compared to MRI 01/31/2016. There is also a chronic infarct within the paramedian right pons which was not present on this prior examination. Nonspecific chronic microhemorrhage within the right thalamus. Stable, moderate generalized parenchymal atrophy. No evidence of intracranial mass. No extra-axial fluid collection. No midline shift. Vascular: Expected proximal arterial flow voids. Skull  and upper cervical spine: No focal marrow lesion. Sinuses/Orbits: Visualized orbits show no acute finding. Mild paranasal sinus mucosal thickening, most notably ethmoid. No significant mastoid effusion. IMPRESSION: Numerous small acute cortical/subcortical embolic-type infarcts within the left cerebral hemisphere affecting the left ACA, MCA, PCA and watershed territories. Multiple small acute infarcts are also present within the left basal ganglia and left thalamus. Redemonstrated chronic cortically based right occipital lobe infarct. Background moderate to advanced chronic small vessel ischemic disease. Multiple chronic lacunar infarcts within the deep gray nuclei have increased in number as compared to MRI 01/31/2016. Additionally, there is a chronic infarct within the right pons which was not present on this prior examination. Moderate generalized parenchymal atrophy. Mild paranasal sinus mucosal  thickening. Electronically Signed   By: Jackey Loge DO   On: 03/02/2020 14:39   ECHOCARDIOGRAM COMPLETE  Result Date: 03/02/2020    ECHOCARDIOGRAM REPORT   Patient Name:   ZAVEION CAMPANY Date of Exam: 03/02/2020 Medical Rec #:  [id number]         Height:       73.0 in Accession #:    [id number]        Weight:       227.8 lb Date of Birth:  1951-06-12         BSA:          2.274 m Patient Age:    69 years          BP:           139/110 mmHg Patient Gender: M                 HR:           74 bpm. Exam Location:  Inpatient Procedure: 2D Echo and Intracardiac Opacification Agent Indications:    Stroke 434.91 / I163.9                 Congestive Heart Failure 428.0 / I50.9  History:        Patient has prior history of Echocardiogram examinations, most                 recent 03/06/2016. CAD, Stroke and PAD, Arrythmias:Atrial                 Fibrillation; Risk Factors:Hypertension. Chronic kidney disease,                 Cocaine use, Thrombocytopenia, Cirrhosis, GERD.  Sonographer:    Leta Jungling RDCS Referring Phys: [id number] NISCHAL NARENDRA IMPRESSIONS  1. Left ventricular ejection fraction, by estimation, is 60 to 65%. The left ventricle has normal function. The left ventricle has no regional wall motion abnormalities. There is severe concentric left ventricular hypertrophy. Left ventricular diastolic  parameters are indeterminate. Elevated left ventricular end-diastolic pressure.  2. Right ventricular systolic function is normal. The right ventricular size is normal. There is normal pulmonary artery systolic pressure.  3. Left atrial size was severely dilated.  4. The mitral valve is normal in structure. Trivial mitral valve regurgitation. No evidence of mitral stenosis.  5. The aortic valve is normal in structure. Aortic valve regurgitation is not visualized. No aortic stenosis is present.  6. The inferior vena cava is normal in size with greater than 50% respiratory variability, suggesting right atrial  pressure of 3 mmHg. FINDINGS  Left Ventricle: Left ventricular ejection fraction, by estimation, is 60 to 65%. The left ventricle has normal function. The left ventricle has no regional wall motion abnormalities.  Definity contrast agent was given IV to delineate the left ventricular  endocardial borders. The left ventricular internal cavity size was normal in size. There is severe concentric left ventricular hypertrophy. Left ventricular diastolic parameters are indeterminate. Elevated left ventricular end-diastolic pressure. Right Ventricle: The right ventricular size is normal. No increase in right ventricular wall thickness. Right ventricular systolic function is normal. There is normal pulmonary artery systolic pressure. The tricuspid regurgitant velocity is 1.63 m/s, and  with an assumed right atrial pressure of 3 mmHg, the estimated right ventricular systolic pressure is 13.6 mmHg. Left Atrium: Left atrial size was severely dilated. Right Atrium: Right atrial size was normal in size. Pericardium: There is no evidence of pericardial effusion. Mitral Valve: The mitral valve is normal in structure. Normal mobility of the mitral valve leaflets. Trivial mitral valve regurgitation. No evidence of mitral valve stenosis. Tricuspid Valve: The tricuspid valve is normal in structure. Tricuspid valve regurgitation is trivial. No evidence of tricuspid stenosis. Aortic Valve: The aortic valve is normal in structure.. There is mild thickening and mild calcification of the aortic valve. Aortic valve regurgitation is not visualized. No aortic stenosis is present. There is mild thickening of the aortic valve. There is mild calcification of the aortic valve. Pulmonic Valve: The pulmonic valve was normal in structure. Pulmonic valve regurgitation is not visualized. No evidence of pulmonic stenosis. Aorta: The aortic root is normal in size and structure. Venous: The inferior vena cava is normal in size with greater than 50%  respiratory variability, suggesting right atrial pressure of 3 mmHg. IAS/Shunts: No atrial level shunt detected by color flow Doppler.  LEFT VENTRICLE PLAX 2D LVIDd:         3.66 cm     Diastology LVIDs:         2.29 cm     LV e' lateral:   6.60 cm/s LV PW:         1.59 cm     LV E/e' lateral: 12.1 LV IVS:        1.83 cm     LV e' medial:    4.76 cm/s LVOT diam:     2.40 cm     LV E/e' medial:  16.8 LV SV:         44 LV SV Index:   19 LVOT Area:     4.52 cm  LV Volumes (MOD) LV vol d, MOD A2C: 52.9 ml LV vol d, MOD A4C: 64.7 ml LV vol s, MOD A2C: 28.5 ml LV vol s, MOD A4C: 35.8 ml LV SV MOD A2C:     24.4 ml LV SV MOD A4C:     64.7 ml LV SV MOD BP:      26.4 ml LEFT ATRIUM              Index LA diam:        4.30 cm  1.89 cm/m LA Vol (A2C):   50.9 ml  22.38 ml/m LA Vol (A4C):   103.0 ml 45.29 ml/m LA Biplane Vol: 78.3 ml  34.43 ml/m  AORTIC VALVE LVOT Vmax:   64.95 cm/s LVOT Vmean:  45.250 cm/s LVOT VTI:    0.097 m  AORTA Ao Root diam: 3.10 cm MITRAL VALVE               TRICUSPID VALVE MV Area (PHT): 5.97 cm    TR Peak grad:   10.6 mmHg MV Decel Time: 127 msec    TR Vmax:  163.00 cm/s MV E velocity: 79.95 cm/s                            SHUNTS                            Systemic VTI:  0.10 m. Systemic Diam: 2.40 cm Chilton Si MD Electronically signed by Chilton Si MD Signature Date/Time: 03/02/2020/2:53:05 PM    Final     Discharge Instructions: Discharge Instructions    Diet - low sodium heart healthy   Complete by: As directed    Discharge instructions   Complete by: As directed    You were hospitalized for Stroke. Thank you for allowing [place] to be part of your care.   Please arrange follow up with your PCP once you are discharge from in patient rehab.  Please note these changes made to your medications:   Please see  discharge paperwork for medication changes.  Please make sure to follow up with your PCP.  Please call our clinic if you have any questions or concerns, we may  be able to help and keep you from a long and expensive emergency room wait. Our clinic and after hours phone number is [phone], the best time to call is Monday through Friday 9 am to 4 pm but there is always someone available 24/7 if you have an emergency. If you need medication refills please notify your pharmacy one week in advance and they will send [place] a request.   Increase activity slowly   Complete by: As directed       Signed: Dellia Cloud, MD 03/06/2020, 1:37 PM   Pager: [phone]

## 2020-03-06 NOTE — Progress Notes (Signed)
Physical Therapy Treatment Patient Details Name: Kevin [name] MRN: [id number] DOB: August 21, 1951 Today's Date: 03/06/2020    History of Present Illness Mr Seto is a 69 yo M with Hx of HTN, CKD, CAD, CHF, A-fib, PAD, Cocaine use, Thrombocytopenia, Cirrhosis, CVA, Seizures, and GERD who presented to the ED after waking up in the morning with right sided weakness, facial droop and slurred speech. He was found on the floor by his wife at around 530 am.    PT Comments    Patient is making progress toward PT goals and tolerated increased mobility well. Pt requires +2 assist for functional transfer/gait training. Continue to recommend CIR for further skilled PT services to maximize independence and safety with mobility.    Follow Up Recommendations  CIR     Equipment Recommendations  Other (comment) (TBD next venue)    Recommendations for Other Services Rehab consult     Precautions / Restrictions Precautions Precautions: Fall Restrictions Weight Bearing Restrictions: No    Mobility  Bed Mobility Overal bed mobility: Needs Assistance Bed Mobility: Supine to Sit     Supine to sit: HOB elevated;Min assist     General bed mobility comments: cues for sequencing and use of rails; increased time and effort to get R UE positioned for use of rail but able to do so without physical assistance; assist to elevate trunk into sitting   Transfers Overall transfer level: Needs assistance Equipment used: Rolling walker (2 wheeled) Transfers: Sit to/from Stand Sit to Stand: +2 physical assistance;+2 safety/equipment;Mod assist         General transfer comment: pt stood from EOB and recliner with +2 assist to power up into standing and to maintain balance upon standing with R knee blocked and R UE supported by therapist  Ambulation/Gait Ambulation/Gait assistance: Mod assist;+2 physical assistance;+2 safety/equipment   Assistive device: Rolling walker (2 wheeled) Gait  Pattern/deviations: Step-to pattern;Trunk flexed     General Gait Details: pt able to take steps bed to recliner with multimodal cues for sequencing; assistance required to weight shift, maintain balance, and manage RW    Stairs             Wheelchair Mobility    Modified Rankin (Stroke Patients Only)       Balance Overall balance assessment: Needs assistance Sitting-balance support: Single extremity supported;Feet supported Sitting balance-Leahy Scale: Poor Sitting balance - Comments: pt leaning forward and the right needing verbal cues to fix posture. Postural control: Right lateral lean (anterior) Standing balance support: Bilateral upper extremity supported;During functional activity Standing balance-Leahy Scale: Poor                              Cognition Arousal/Alertness: Awake/alert Behavior During Therapy: Impulsive Overall Cognitive Status: Impaired/Different from baseline Area of Impairment: Orientation;Memory;Following commands;Safety/judgement;Awareness;Problem solving                 Orientation Level: Disoriented to;Time Current Attention Level: Sustained Memory: Decreased short-term memory Following Commands: Follows one step commands with increased time;Follows multi-step commands inconsistently Safety/Judgement: Decreased awareness of safety;Decreased awareness of deficits Awareness: Intellectual Problem Solving: Slow processing;Difficulty sequencing;Requires verbal cues;Requires tactile cues General Comments: Pt A&Ox3 with incorrect year. Needed increased time to complete commands and often forgot what he was doing. Pt unaware of safety with impulsivity upon trying to stand.      Exercises      General Comments        Pertinent Vitals/Pain Pain  Assessment: No/denies pain    Home Living                      Prior Function            PT Goals (current goals can now be found in the care plan section) Progress  towards PT goals: Progressing toward goals    Frequency    Min 4X/week      PT Plan Current plan remains appropriate    Co-evaluation PT/OT/SLP Co-Evaluation/Treatment: Yes Reason for Co-Treatment: Complexity of the patient's impairments (multi-system involvement);For patient/therapist safety;To address functional/ADL transfers PT goals addressed during session: Mobility/safety with mobility;Balance        AM-PAC PT "6 Clicks" Mobility   Outcome Measure  Help needed turning from your back to your side while in a flat bed without using bedrails?: A Lot Help needed moving from lying on your back to sitting on the side of a flat bed without using bedrails?: A Lot Help needed moving to and from a bed to a chair (including a wheelchair)?: A Lot Help needed standing up from a chair using your arms (e.g., wheelchair or bedside chair)?: A Lot Help needed to walk in hospital room?: A Lot Help needed climbing 3-5 steps with a railing? : Total 6 Click Score: 11    End of Session Equipment Utilized During Treatment: Gait belt Activity Tolerance: Patient tolerated treatment well Patient left: in chair;with call bell/phone within reach;with chair alarm set Nurse Communication: Mobility status PT Visit Diagnosis: Other abnormalities of gait and mobility (R26.89);Muscle weakness (generalized) (M62.81);Hemiplegia and hemiparesis Hemiplegia - Right/Left: Right Hemiplegia - dominant/non-dominant: Non-dominant Hemiplegia - caused by: Cerebral infarction     Time: 1130-1200 PT Time Calculation (min) (ACUTE ONLY): 30 min  Charges:  $Gait Training: 8-22 mins                     Earney Navy, PTA Acute Rehabilitation Services Pager: [phone] Office: [phone]     Darliss Cheney 03/06/2020, 4:29 PM

## 2020-03-06 NOTE — Progress Notes (Signed)
Pt discharge education and instructions completed. Pt discharge to CIR and report called off to CIR nurse. Pt IV and telemetry removed. Pt to be transported off unit via bed with spouse and belongings to the side. Dionne Bucy RN

## 2020-03-06 NOTE — H&P (Signed)
Physical Medicine and Rehabilitation Admission H&P        Chief Complaint  Patient presents with  . Stroke with functional deficits.       HPI: Kevin [name] is a 69 year old LH-male with history HTN, CAD w/chronic systolic CHF, CKD- SCr 1.9?, cirrhosis of the liver with chronic thrombocytopenia, polysubstance abuse, multifactorial SOB, CAF - on Eliquis, prior CVA/TIA, h/o seizures who was admitted on 03/02/20 after fall off the bed with slurred speech and noted to have right sided weakness at admission.  CT head negative for bleed. CTA head/CT perfusion negative for LVO or perfusion deficits. He was loaded with Keppra due to concerns of Todd's paralysis.   RI brain done revealing numerous acute cortical/subcortical embolic infarcts in Left ACA, MCA, PCA and watershed territories as well as multiple small acute infarcts in left basal ganglia and left thalamus with moderate generalized atrophy.     2D echo showed EF 60-65% with severe concentric LVH and normal LVH. EEG with mild slowing and no seizure activity.  UDS positive for cocaine and THC. Dr. Pearlean Brownie recommended contiuing Eliquis as well as risk factor modification. Patient continues to be limited by right sided weakness with poor safety awareness and decrease in problem solving affecting mobility and ADLs. CIR recommended due to functional decline.      Review of Systems  Constitutional: Negative for chills and fever.  HENT: Positive for hearing loss.   Eyes: Positive for blurred vision and double vision (wife doubts this. ).  Respiratory: Positive for shortness of breath and wheezing.   Cardiovascular: Negative for chest pain, palpitations and leg swelling.  Gastrointestinal: Positive for constipation. Negative for heartburn and nausea.  Genitourinary: Negative for frequency and urgency.       Nocturia X 2.   Musculoskeletal: Positive for myalgias. Negative for back pain.  Skin: Negative for rash.  Neurological: Positive for  sensory change, speech change and weakness. Negative for dizziness and headaches.  Psychiatric/Behavioral: The patient is not nervous/anxious.             Past Medical History:  Diagnosis Date  . Atrial fibrillation, permanent (HCC) 02/01/2016  . Blood transfusion without reported diagnosis    . CAD in native artery cath 2016 90% mRamus  02/01/2016  . CHF (congestive heart failure) (HCC)    . Cholelithiases 05/01/2012  . Claudication (HCC)      bilateral  . Cocaine abuse (HCC)    . Dysrhythmia      afib  . ETOH abuse    . Hepatitis C antibody test positive 04/2012  . Hyperpigmentation    . Hypertension    . PAD (peripheral artery disease) (HCC)    . Persistent atrial fibrillation (HCC) 08/23/2014  . Thrombocytopenia (HCC)    . Tobacco abuse             Past Surgical History:  Procedure Laterality Date  . Cardiolite        negative for ischemia  . CARDIOVASCULAR STRESS TEST      . CARDIOVERSION   05/05/2012    Procedure: CARDIOVERSION;  Surgeon: Thurmon Fair, MD;  Location: [facility] ENDOSCOPY;  Service: Cardiovascular;  Laterality: N/A;  . CAROTID ARTERY ANGIOPLASTY      . DOPPLER ECHOCARDIOGRAPHY      . ENDOVASCULAR STENT INSERTION   right leg  . LEFT HEART CATHETERIZATION WITH CORONARY ANGIOGRAM N/A 05/01/2012    Procedure: LEFT HEART CATHETERIZATION WITH CORONARY ANGIOGRAM;  Surgeon: Marykay Lex, MD;  Location:  [facility] CATH LAB;  Service: Cardiovascular;  Laterality: N/A;  . LEFT HEART CATHETERIZATION WITH CORONARY ANGIOGRAM N/A 09/27/2014    Procedure: LEFT HEART CATHETERIZATION WITH CORONARY ANGIOGRAM;  Surgeon: Chrystie Nose, MD;  Location: [facility] CATH LAB;  Service: Cardiovascular;  Laterality: N/A;  . TEE WITHOUT CARDIOVERSION   05/05/2012    Procedure: TRANSESOPHAGEAL ECHOCARDIOGRAM (TEE);  Surgeon: Thurmon Fair, MD;  Location: [facility] ENDOSCOPY;  Service: Cardiovascular;  Laterality: N/A;           Family History  Problem Relation Age of Onset  . Seizures Daughter    . Cancer  Mother        Social History:  Married. 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 He reports that he has been smoking cigarettes. He has a 10.00 pack-year smoking history. He has never used smokeless tobacco. He reports current alcohol use. He reports that he does not use drugs.      Allergies: No Known Allergies            Medications Prior to Admission  Medication Sig Dispense Refill  . albuterol (VENTOLIN HFA) 108 (90 Base) MCG/ACT inhaler Inhale 2 puffs into the lungs every 6 (six) hours as needed for wheezing or shortness of breath. 18 g 5  . apixaban (ELIQUIS) 5 MG TABS tablet Take 1 tablet (5 mg total) by mouth 2 (two) times daily. 180 tablet 3  . buPROPion (WELLBUTRIN SR) 150 MG 12 hr tablet TAKE 1 TABLET BY MOUTH TWICE A DAY (Patient taking differently: Take 150 mg by mouth 2 (two) times daily. ) 60 tablet 0  . carvedilol (COREG) 25 MG tablet TAKE 1 TABLET BY MOUTH 2 TIMES DAILY WITH A MEAL. (Patient taking differently: Take 25 mg by mouth 2 (two) times daily with a meal. ) 60 tablet 2  . folic acid (FOLVITE) 1 MG tablet Take 1 tablet (1 mg total) by mouth daily. Need appointment before anymore refills 30 tablet 1  . Multiple Vitamin (MULTIVITAMIN WITH MINERALS) TABS tablet Take 1 tablet by mouth daily. Men's One a Day      . rosuvastatin (CRESTOR) 20 MG tablet Take 20 mg by mouth at bedtime.      . sacubitril-valsartan (ENTRESTO) 97-103 MG Take 1 tablet by mouth 2 (two) times daily. 60 tablet 2  . spironolactone (ALDACTONE) 25 MG tablet Take 0.5 tablets (12.5 mg total) by mouth daily. 45 tablet 3  . TRELEGY ELLIPTA 100-62.5-25 MCG/INH AEPB Take 1 puff by mouth daily.      . furosemide (LASIX) 40 MG tablet Take 1 tablet (40 mg total) by mouth daily. (Patient not taking: Reported on 03/02/2020) 30 tablet 0  . levETIRAcetam (KEPPRA) 1000 MG tablet Take 1 tablet (1,000 mg total) by mouth 2 (two) times daily. (Patient not taking: Reported on 03/02/2020) 180 tablet 2  . pantoprazole  (PROTONIX) 40 MG tablet Take 1 tablet (40 mg total) by mouth at bedtime. (Patient not taking: Reported on 03/02/2020) 90 tablet 3      Drug Regimen Review  Drug regimen was reviewed and remains appropriate with no significant issues identified   Home: Home Living Family/patient expects to be discharged to:: Private residence Living Arrangements: Spouse/significant other Available Help at Discharge: Family Type of Home: House Home Access: Stairs to enter Secretary/administrator of Steps: 1 Entrance Stairs-Rails: None Home Layout: One level Alternate Level Stairs-Rails: Right Bathroom Shower/Tub: Engineer, manufacturing systems: Handicapped height Bathroom Accessibility: Yes Home Equipment: Cane - single point Additional Comments: Patient was using  cane prior to admission due to prior CVA  Lives With: Spouse   Functional History: Prior Function Level of Independence: Independent with assistive device(s) Comments: driving   Functional Status:  Mobility: Bed Mobility Overal bed mobility: Needs Assistance Bed Mobility: Supine to Sit Supine to sit: Mod assist, HOB elevated Sit to supine: Mod assist, +2 for physical assistance General bed mobility comments: cues for sequencing and use of rials and to elevate trunk into sitting  Transfers Overall transfer level: Needs assistance Equipment used: Rolling walker (2 wheeled) Transfers: Sit to/from Stand Sit to Stand: +2 physical assistance, Mod assist, Min assist, +2 safety/equipment General transfer comment: stood pt x2. First stand min A +2 and with fatigue needing Mod A +2 for R knee blocking and R UE positioning to bear weight Ambulation/Gait General Gait Details: unable at this time   ADL: ADL Overall ADL's : Needs assistance/impaired Grooming: Moderate assistance, Cueing for sequencing, Standing Grooming Details (indicate cue type and reason): Mod A in utilizing affected UE to bring hand to face. Mod cueing for reminding pt  of task and sequencing Upper Body Bathing: Sitting, Cueing for safety, Maximal assistance Upper Body Bathing Details (indicate cue type and reason): max A due to lack of ROM/strength in RUE and impaired sitting balance Lower Body Bathing: Maximal assistance, Bed level, Cueing for sequencing Upper Body Dressing : Maximal assistance, Cueing for compensatory techniques Upper Body Dressing Details (indicate cue type and reason): Max A with verbal cues to use compensatory techniques to thread affected arm through gown with non-affected arm.  Lower Body Dressing: Sit to/from stand, Total assistance, +2 for physical assistance Toilet Transfer: Moderate assistance, +2 for physical assistance, +2 for safety/equipment, Cueing for sequencing, Minimal assistance, RW Toilet Transfer Details (indicate cue type and reason): simulated with recliner. Mod A +2 for stabilzing without walker and min A +2 with walker. Mod cueing for hand placement and steps.  Toileting- Architect and Hygiene: Cueing for safety, Cueing for sequencing, Sit to/from stand, Total assistance, +2 for physical assistance Tub/ Shower Transfer:  (deferred due to safety) Functional mobility during ADLs: Moderate assistance, +2 for physical assistance, +2 for safety/equipment, Cueing for sequencing General ADL Comments: Pt mod - min A +2 for ADLs due to weakness on R side, decreased motor planning, and decreased cognition needing mod verbal cues for hand placement and correcting posture in seated and standing   Cognition: Cognition Overall Cognitive Status: Impaired/Different from baseline Orientation Level: (P) Oriented to person Cognition Arousal/Alertness: Awake/alert Behavior During Therapy: Impulsive Overall Cognitive Status: Impaired/Different from baseline Area of Impairment: Orientation, Memory, Following commands, Safety/judgement, Awareness, Problem solving Orientation Level: Disoriented to, Time Current Attention  Level: Sustained Memory: Decreased short-term memory Following Commands: Follows one step commands with increased time, Follows multi-step commands inconsistently Safety/Judgement: Decreased awareness of safety, Decreased awareness of deficits Awareness: Intellectual Problem Solving: Slow processing, Difficulty sequencing, Requires verbal cues, Requires tactile cues General Comments: Pt A&Ox3 with incorrect year. Needed increased time to complete commands and often forgot what he was doing. Pt unaware of safety with impulsivity upon trying to stand.     Blood pressure (!) (P) 138/112, pulse (P) 89, temperature (P) 98.6 F (37 C), temperature source (P) Oral, resp. rate (P) 17, height 6\' 1"  (1.854 m), weight 102.2 kg, SpO2 97 %. Physical Exam  Nursing note and vitals reviewed. GI: Bowel sounds are normal. He exhibits distension. There is no abdominal tenderness.  Neurological: He is alert.  Right facial weakness with mild dysarthria. Decreased  hearing per wife. He was able to answer orientation questions.  He has right sided weakness with right hemibody inattention. Impulsive with poor awareness of deficits and tends to joke to cover up deficits.   Skin:  Well healed old laceration on along right knee/shin. Multiple healed lesions on left shin.     General: No acute distress Mood and affect are appropriate Heart: Regular rate and rhythm no rubs murmurs or extra sounds Lungs: Clear to auscultation, breathing unlabored, no rales or wheezes Abdomen: Positive bowel sounds, soft nontender to palpation, mildly distended Extremities: No clubbing, cyanosis, or edema Skin: No evidence of breakdown, no evidence of rash Neurologic: Cranial nerves II through XII intact, motor strength is 5/5 in left deltoid, bicep, tricep, grip, hip flexor, knee extensors, ankle dorsiflexor and plantar flexor, 3- RIght delt, bi, 2- tri, 2- grip.  3- Right HF, KE, ADF Sensory exam normal sensation to light touch in  bilateral upper and lower extremities Cerebellar exam normal finger to nose to finger as well as heel to shin in left  upper and lower extremities, limited by weakness in the right upper and right lower limb Musculoskeletal: Full range of motion in all 4 extremities. No joint swelling    Lab Results Last 48 Hours        Results for orders placed or performed during the hospital encounter of 03/02/20 (from the past 48 hour(s))  CBC     Status: Abnormal    Collection Time: 03/05/20  7:51 AM  Result Value Ref Range    WBC 6.8 4.0 - 10.5 K/uL    RBC 4.37 4.22 - 5.81 MIL/uL    Hemoglobin 14.8 13.0 - 17.0 g/dL    HCT 22.0 39 - 52 %    MCV 101.6 (H) 80.0 - 100.0 fL    MCH 33.9 26.0 - 34.0 pg    MCHC 33.3 30.0 - 36.0 g/dL    RDW 25.4 27.0 - 62.3 %    Platelets 71 (L) 150 - 400 K/uL      Comment: CONSISTENT WITH PREVIOUS RESULT Immature Platelet Fraction may be clinically indicated, consider ordering this additional test [id number] REPEATED TO VERIFY      nRBC 0.0 0.0 - 0.2 %      Comment: Performed at [facility], [address], [place], [place] [address]  Basic metabolic panel     Status: Abnormal    Collection Time: 03/05/20  7:51 AM  Result Value Ref Range    Sodium 138 135 - 145 mmol/L    Potassium 4.1 3.5 - 5.1 mmol/L    Chloride 110 98 - 111 mmol/L    CO2 22 22 - 32 mmol/L    Glucose, Bld 129 (H) 70 - 99 mg/dL      Comment: Glucose reference range applies only to samples taken after fasting for at least 8 hours.    BUN 14 8 - 23 mg/dL    Creatinine, Ser 7.37 (H) 0.61 - 1.24 mg/dL    Calcium 8.5 (L) 8.9 - 10.3 mg/dL    GFR calc non Af Amer 54 (L) >60 mL/min    GFR calc Af Amer >60 >60 mL/min    Anion gap 6 5 - 15      Comment: Performed at [facility], [address], [place], [place] [address]  Glucose, capillary     Status: Abnormal    Collection Time: 03/05/20  9:26 PM  Result Value Ref Range  Glucose-Capillary 136 (H) 70 - 99 mg/dL      Comment:  Glucose reference range applies only to samples taken after fasting for at least 8 hours.    Comment 1 Notify RN      Comment 2 Document in Chart    CBC     Status: Abnormal    Collection Time: 03/06/20  4:50 AM  Result Value Ref Range    WBC 7.9 4.0 - 10.5 K/uL    RBC 4.36 4.22 - 5.81 MIL/uL    Hemoglobin 14.7 13.0 - 17.0 g/dL    HCT 44.3 39 - 52 %    MCV 101.6 (H) 80.0 - 100.0 fL    MCH 33.7 26.0 - 34.0 pg    MCHC 33.2 30.0 - 36.0 g/dL    RDW 11.7 11.5 - 15.5 %    Platelets 76 (L) 150 - 400 K/uL      Comment: CONSISTENT WITH PREVIOUS RESULT Immature Platelet Fraction may be clinically indicated, consider ordering this additional test [id number]      nRBC 0.0 0.0 - 0.2 %      Comment: Performed at [facility], [place] [address], [place], [place] [address]  Comprehensive metabolic panel     Status: Abnormal    Collection Time: 03/06/20  4:50 AM  Result Value Ref Range    Sodium 139 135 - 145 mmol/L    Potassium 4.3 3.5 - 5.1 mmol/L    Chloride 110 98 - 111 mmol/L    CO2 19 (L) 22 - 32 mmol/L    Glucose, Bld 112 (H) 70 - 99 mg/dL      Comment: Glucose reference range applies only to samples taken after fasting for at least 8 hours.    BUN 16 8 - 23 mg/dL    Creatinine, Ser 1.37 (H) 0.61 - 1.24 mg/dL    Calcium 8.4 (L) 8.9 - 10.3 mg/dL    Total Protein 6.3 (L) 6.5 - 8.1 g/dL    Albumin 3.1 (L) 3.5 - 5.0 g/dL    AST 30 15 - 41 U/L    ALT 19 0 - 44 U/L    Alkaline Phosphatase 52 38 - 126 U/L    Total Bilirubin 1.4 (H) 0.3 - 1.2 mg/dL    GFR calc non Af Amer 53 (L) >60 mL/min    GFR calc Af Amer >60 >60 mL/min    Anion gap 10 5 - 15      Comment: Performed at [place] [address], [place], [place] [address]      Imaging Results (Last 48 hours)  No results found.           Medical Problem List and Plan: 1.   secondary to Left ACA/MCA watershed infarct, small left basal ganglia and thalamic infarct, left parietal occipital infarct.             -patient  may  shower             -ELOS/Goals: 20-24d 2.  Antithrombotics: -DVT/anticoagulation:  Pharmaceutical: Other (comment)--Eliquis.             -antiplatelet therapy: N/A 3. Pain Management: N/A 4. Mood: LCSW to follow for evaluation and support.              -antipsychotic agents: N/a 5. Neuropsych: This patient is capable of making decisions on his own behalf. 6. Skin/Wound Care: Routine pressure relief measures.  7. Fluids/Electrolytes/Nutrition: Monitor I/O. Check lytes in am  8. HTN:  Monitor BP tid--permissive HTN. Now 4 days out and Lasix/Entresto/aldactone resumed 06/14.  9. Chronic systolic CHF: Monitor weights daily and for signs of overload. Continue Coreg bid, Lasix, Entresto,aldactone and Lipitor. No ASA due to low platelets.  10. Cirrhosis of the liver with chronic thrombocytopenia:  Monitor for signs of bleeding or decompensation.  11. Chronic SOB: Multifactorial--? OSA. On Trilegy for reactive airway disease. Duo nebs prn. Encourage IS.  12. Polysubstance abuse + Cocaine and THC on admit : Has been ongoing since 2015. Educate/encourage patient regarding life style changes.  13. Seizure disorder: Continue Keppra bid.  14. Impaired fasting glucose: Hgb A1c- 5.2.   15. CKD: Monitor renal status with serial checks. SCr 1.95 at admission-->1.37 (due to diuretics on hold)    Jacquelynn Cree, PA-C 03/06/2020        "I have personally performed a face to face diagnostic evaluation of this patient.  Additionally, I have reviewed and concur with the physician assistant's documentation above." Erick Colace M.D. [facility] FAAPM&R (Neuromuscular Med) Diplomate Am Board of Electrodiagnostic Med Fellow Am Board of Interventional Pain

## 2020-03-06 NOTE — Progress Notes (Signed)
Inpatient Rehabilitation-Admissions Coordinator   Per my coworker Vernona Rieger, the pt's wife has confirmed 24/7 A at DC. I have initiated insurance authorization process for possible admit.   Cheri Rous, OTR/L  Rehab Admissions Coordinator  [phone] 03/06/2020 9:45 AM

## 2020-03-06 NOTE — Progress Notes (Signed)
Pt admitted to room 4W21. Oriented to floor, call bell and rehab fall policy. Denies any pain or discomfort. Continue plan of care.  Marylu Lund, RN

## 2020-03-06 NOTE — Progress Notes (Signed)
Occupational Therapy Treatment Patient Details Name: Kevin [name] MRN: [id number] DOB: Dec 25, 1950 Today's Date: 03/06/2020    History of present illness Kevin [name] is a 69 yo M with Hx of HTN, CKD, CAD, CHF, A-fib, PAD, Cocaine use, Thrombocytopenia, Cirrhosis, CVA, Seizures, and GERD who presented to the ED after waking up in the morning with right sided weakness, facial droop and slurred speech. He was found on the floor by his wife at around 530 am.   OT comments  Upon arrival pt laying in bed, agreeable to therapy services. Pt required min A for bed mobility and min A-mod A +2 for sit<>stand transfers (assist increase with fatigue) with multimodal cues for hand placement, foot placement, and posture. Pt completed grooming at sink with Mod A +2 to maintain sitting while washing his face - mod A with multimodal cues to facilitate ROM to bring R UE to face. AAROM of shoulder, elbow, forearm, wrist, and hand to facilitate functional use of R UE. Noted improvement of 3/5 MMT in composite flex/ext, wrist flex/ext, and forearm sup/pronation. Pt reported pain in R elbow with extension, OT completes gentle PROM and taught self ROM to assist with tightness and pain in elbow.    Follow Up Recommendations  CIR    Equipment Recommendations   (TBD at next venue of care)    Recommendations for Other Services Rehab consult    Precautions / Restrictions Precautions Precautions: Fall Restrictions Weight Bearing Restrictions: No       Mobility Bed Mobility Overal bed mobility: Needs Assistance Bed Mobility: Supine to Sit     Supine to sit: Mod assist;HOB elevated     General bed mobility comments: cues for sequencing and use of rials and to elevate trunk into sitting   Transfers Overall transfer level: Needs assistance Equipment used: Rolling walker (2 wheeled) Transfers: Sit to/from Stand Sit to Stand: +2 physical assistance;Mod assist;Min assist;+2 safety/equipment          General transfer comment: stood pt x2. First stand min A +2 and with fatigue needing Mod A +2 for R knee blocking and R UE positioning to bear weight    Balance Overall balance assessment: Needs assistance Sitting-balance support: Single extremity supported;Feet supported Sitting balance-Leahy Scale: Poor Sitting balance - Comments: pt leaning forward and the right needing verbal cues to fix posture. Postural control: Right lateral lean (anterior) Standing balance support: Bilateral upper extremity supported;During functional activity Standing balance-Leahy Scale: Poor                             ADL either performed or assessed with clinical judgement   ADL Overall ADL's : Needs assistance/impaired     Grooming: Moderate assistance;Cueing for sequencing;Standing Grooming Details (indicate cue type and reason): Mod A in utilizing affected UE to bring hand to face. Mod cueing for reminding pt of task and sequencing +2 assist in standing          Upper Body Dressing : Maximal assistance;Cueing for compensatory techniques Upper Body Dressing Details (indicate cue type and reason): Max A with verbal cues to use compensatory techniques to thread affected arm through gown with non-affected arm.      Toilet Transfer: Moderate assistance;+2 for physical assistance;+2 for safety/equipment;Cueing for sequencing;Minimal assistance;RW Toilet Transfer Details (indicate cue type and reason): simulated with recliner. Mod A +2 for stabilzing without walker and min A +2 with walker. Mod cueing for hand placement and steps.  Functional mobility during ADLs: Moderate assistance;+2 for physical assistance;+2 for safety/equipment;Cueing for sequencing General ADL Comments: Pt mod - min A +2 for ADLs due to weakness on R side, decreased motor planning, and decreased cognition needing mod verbal cues for hand placement and correcting posture in seated and standing     Vision        Perception     Praxis      Cognition Arousal/Alertness: Awake/alert Behavior During Therapy: Impulsive Overall Cognitive Status: Impaired/Different from baseline Area of Impairment: Orientation;Memory;Following commands;Safety/judgement;Awareness;Problem solving                 Orientation Level: Disoriented to;Time Current Attention Level: Sustained Memory: Decreased short-term memory Following Commands: Follows one step commands with increased time;Follows multi-step commands inconsistently Safety/Judgement: Decreased awareness of safety;Decreased awareness of deficits Awareness: Intellectual Problem Solving: Slow processing;Difficulty sequencing;Requires verbal cues;Requires tactile cues General Comments: Pt A&Ox3 with incorrect year. Needed increased time to complete commands and often forgot what he was doing. Pt unaware of safety with impulsivity upon trying to stand.        Exercises Exercises: Other exercises Other Exercises Other Exercises: AAROM of shoulder, elbow, forearm, wrist, and hand to facilitate functional use of R UE. Noted improvement of 3/5 MMT in composite flex/ext, wrist flex/ext, and forearm sup/pronation. Other Exercises: Pt reported pain in R elbow with extension, OT completes gentle PROM and taught self ROM to assist with tightness and pain in elbow.   Shoulder Instructions       General Comments Pt increasing functional use of R UE with reaching for bed rail and attempting to wash face. Increase AROM in R hand, forearm, and elbow.    Pertinent Vitals/ Pain       Pain Assessment: No/denies pain  Home Living                                          Prior Functioning/Environment              Frequency  Min 3X/week        Progress Toward Goals  OT Goals(current goals can now be found in the care plan section)  Progress towards OT goals: Progressing toward goals  Acute Rehab OT Goals Patient Stated Goal: to  improve use of Right side OT Goal Formulation: With patient/family Time For Goal Achievement: 03/17/20 Potential to Achieve Goals: Good  Plan Discharge plan remains appropriate    Co-evaluation    PT/OT/SLP Co-Evaluation/Treatment: Yes Reason for Co-Treatment: Complexity of the patient's impairments (multi-system involvement);For patient/therapist safety;To address functional/ADL transfers   OT goals addressed during session: ADL's and self-care;Strengthening/ROM;Proper use of Adaptive equipment and DME      AM-PAC OT "6 Clicks" Daily Activity     Outcome Measure   Help from another person eating meals?: A Little Help from another person taking care of personal grooming?: A Lot Help from another person toileting, which includes using toliet, bedpan, or urinal?: A Lot Help from another person bathing (including washing, rinsing, drying)?: Total Help from another person to put on and taking off regular upper body clothing?: A Lot Help from another person to put on and taking off regular lower body clothing?: Total 6 Click Score: 11    End of Session Equipment Utilized During Treatment: Gait belt;Rolling walker  OT Visit Diagnosis: Unsteadiness on feet (R26.81);Other abnormalities of gait and mobility (R26.89);Muscle weakness (generalized) (  M62.81);Other symptoms and signs involving the nervous system (R29.898);Other symptoms and signs involving cognitive function;Cognitive communication deficit (R41.841);Pain;Hemiplegia and hemiparesis Symptoms and signs involving cognitive functions: Cerebral infarction Hemiplegia - Right/Left: Right Hemiplegia - dominant/non-dominant: Non-Dominant Hemiplegia - caused by: Cerebral infarction Pain - Right/Left: Right Pain - part of body:  (Elbow)   Activity Tolerance Patient limited by fatigue   Patient Left in chair;with call bell/phone within reach   Nurse Communication Mobility status        Time: 5400-8676 OT Time Calculation (min):  42 min  Charges: OT General Charges $OT Visit: 1 Visit OT Treatments $Self Care/Home Management : 8-22 mins $Neuromuscular Re-education: 8-22 mins  Bettylou Frew/OTS   Dewanna Hurston 03/06/2020, 2:26 PM

## 2020-03-06 NOTE — Progress Notes (Signed)
Raechel Ache, OT  Rehab Admission Coordinator  Physical Medicine and Rehabilitation  PMR Pre-admission      Signed  Date of Service:  03/04/2020  1:45 PM      Related encounter: ED to Hosp-Admission (Discharged) from 03/02/2020 in [facility]      Signed       Show:Clear all '[x]' Manual'[x]' Template'[x]' Copied  Added by: '[x]' Raechel Ache, OT'[x]' Kevin [name], CCC-SLP  '[]' Hover for details PMR Admission Coordinator Pre-Admission Assessment   Patient: Kevin [name] is an 69 y.o., male MRN: [id number] DOB: 10-03-1950 Height: '6\' 1"'  (185.4 cm) Weight: 102.2 kg                                                                                                                                                  Insurance Information HMO: yes    PPO:      PCP:      IPA:      80/20:      OTHER:  PRIMARY: [employer]      Policy#: [id number]      Subscriber: patient CM Name: Kevin [name]       Phone#: [phone]     Fax#: [phone] Pre-Cert#: [id number]      Employer:  Kevin [name] provided by Kevin [name] for admit to CIR. Pt is approved for 5 days with start date 6/14. Weekly updates are due to Kevin [name] (p): [phone] [id number] (f): [phone] Benefits:  Phone #: online at [email or web address]     Name: transaction ID #[id number] Eff. Date: 09/24/19- 09/22/20      Deduct: does not have for in-network providers      Out of Pocket Max: $3,900 ($100 met)       Life Max: NA  CIR: $295/day co-pay for days 1-6, $0/day co-pya for days 7-90      SNF: $0/day co-pay for days 1-20, $184/day co-pay for days 21-100; limited to 100 days/cal yr Outpatient: $10-40/visist co-pay pending service; visits limited by medical necessity     Home Health: 100% coverage, 0% co-insurance; limited by medical necessity DME: 80% coverage, 20% co-insurance     Providers:   SECONDARY: None      Policy#:       Phone#:    The "Data Collection Information Summary" for patients in Inpatient Rehabilitation  Facilities with attached "Privacy Act [place] Records" was provided and verbally reviewed with: Patient and Family   Emergency Contact Information         Contact Information     Name Relation Home Work Mobile    Kevin [name] Spouse [phone]   [phone]       Current Medical History  Patient Admitting Diagnosis: CVA History of Present Illness: Kevin [name] is a 69 y.o. right-handed male with history of hypertension, CKD, CAD, CHF, atrial fibrillation maintained on Eliquis, cocaine/alcohol/tobacco use.  Per  chart review patient lives with spouse.  Independent with assistive device.  Two-level home bed and bath upstairs.  Presented 03/02/2020 with right side weakness and slurred speech as well as question seizure. [facility] MRI showed numerous small acute cortical subcortical embolic type infarcts within the left cerebral hemisphere affecting the left ACA, MCA, PCA and watershed territories.  Multiple small acute infarcts also present within the left basal ganglia and left thalamus.  Echocardiogram with ejection fraction of 65% no wall motion abnormalities.  EEG negative for seizure.  Admission chemistries with glucose 197, BUN 27, creatinine 1.95, urine drug screen pending, alcohol negative.  Patient currently maintained on Eliquis as prior to admission.  Keppra added for seizure prophylaxis.  Tolerating a regular diet.    Complete NIHSS TOTAL: (P) 9 Glasgow Coma Scale Score: 14   Past Medical History      Past Medical History:  Diagnosis Date  . Atrial fibrillation, permanent ([place]) 02/01/2016  . Blood transfusion without reported diagnosis    . CAD in native artery cath 2016 90% mRamus  02/01/2016  . CHF (congestive heart failure) ([employer])    . Cholelithiases 05/01/2012  . Claudication ([place])      bilateral  . Cocaine abuse ([place])    . Dysrhythmia      afib  . ETOH abuse    . Hepatitis C antibody test positive 04/2012  . Hyperpigmentation    . Hypertension    . PAD  (peripheral artery disease) ([place])    . Persistent atrial fibrillation ([place]) 08/23/2014  . Thrombocytopenia ([place])    . Tobacco abuse        Family History  family history includes Cancer in his mother; Seizures in his daughter.   Prior Rehab/Hospitalizations:  Has the patient had prior rehab or hospitalizations prior to admission? No   Has the patient had major surgery during 100 days prior to admission? No   Current Medications    Current Facility-Administered Medications:  .  0.9 %  sodium chloride infusion, , Intravenous, PRN, Kevin Contes, MD, Last Rate: 10 mL/hr at 03/03/20 2033, 250 mL at 03/03/20 2033 .  apixaban (ELIQUIS) tablet 5 mg, 5 mg, Oral, BID, Kevin Seat, MD, 5 mg at 03/06/20 1010 .  atorvastatin (LIPITOR) tablet 40 mg, 40 mg, Oral, q1800, Kevin Seat, MD, 40 mg at 03/05/20 1724 .  carvedilol (COREG) tablet 25 mg, 25 mg, Oral, BID WC, Kevin [name], Idalys, MD, 25 mg at 03/06/20 1010 .  fluticasone furoate-vilanterol (BREO ELLIPTA) 100-25 MCG/INH 1 puff, 1 puff, Inhalation, Daily, 1 puff at 03/06/20 0817 **AND** umeclidinium bromide (INCRUSE ELLIPTA) 62.5 MCG/INH 1 puff, 1 puff, Inhalation, Daily, Kevin Seat, MD, 1 puff at 03/06/20 0817 .  furosemide (LASIX) tablet 20 mg, 20 mg, Oral, Daily, Kevin Payment, MD .  ipratropium-albuterol (DUONEB) 0.5-2.5 (3) MG/3ML nebulizer solution 3 mL, 3 mL, Nebulization, Q6H PRN, Kevin Seat, MD, 3 mL at 03/06/20 1334 .  levETIRAcetam (KEPPRA) tablet 1,000 mg, 1,000 mg, Oral, BID, Kevin Payment, MD, 1,000 mg at 03/06/20 1010 .  sacubitril-valsartan (ENTRESTO) 97-103 mg per tablet, 1 tablet, Oral, BID, Kevin Payment, MD, 1 tablet at 03/06/20 1011 .  sodium chloride flush (NS) 0.9 % injection 3 mL, 3 mL, Intravenous, Q12H, Kevin Seat, MD, 3 mL at 03/06/20 1011 .  spironolactone (ALDACTONE) tablet 12.5 mg, 12.5 mg, Oral, Once, Kevin Payment, MD   Patients Current Diet:     Diet Order  Diet - low sodium heart healthy              Diet Heart Room service appropriate? Yes; Fluid consistency: Thin  Diet effective now                      Precautions / Restrictions Precautions Precautions: Fall Restrictions Weight Bearing Restrictions: No    Has the patient had 2 or more falls or a fall with injury in the past year?No   Prior Activity Level Limited Community (1-2x/wk): 1-2x a week   Prior Functional Level Prior Function Level of Independence: Independent with assistive device(s) Comments: driving   Self Care: Did the patient need help bathing, dressing, using the toilet or eating?  Independent   Indoor Mobility: Did the patient need assistance with walking from room to room (with or without device)? Independent with occasional use of cane   Stairs: Did the patient need assistance with internal or external stairs (with or without device)? Pt. Was not able to climb stairs prior to admission   Functional Cognition: Did the patient need help planning regular tasks such as shopping or remembering to take medications? Independent   Home Assistive Devices / Equipment Home Assistive Devices/Equipment: Cane (specify quad or straight) Home Equipment: Cane - single point   Prior Device Use: Indicate devices/aids used by the patient prior to current illness, exacerbation or injury? Walker and Single point cane (Pt. Used intermittently)   Current Functional Level Cognition   Overall Cognitive Status: Impaired/Different from baseline Current Attention Level: Sustained Orientation Level: (P) Oriented to person Following Commands: Follows one step commands with increased time, Follows multi-step commands inconsistently Safety/Judgement: Decreased awareness of safety, Decreased awareness of deficits General Comments: Pt A&Ox3 with incorrect year. Needed increased time to complete commands and often forgot what he was doing. Pt unaware of safety with impulsivity upon  trying to stand.    Extremity Assessment (includes Sensation/Coordination)   Upper Extremity Assessment: RUE deficits/detail RUE Deficits / Details: PROM WFL, AROM limited by strength and motor planning - pt is seen trying to use UE more but decreased functional use. Increased tightness in R elbow with confirmed pain with pt. Educated on self-ROM. 3/5 MMT in wrist flexion/extension, forearm supination/pronation RUE Sensation: decreased light touch, decreased proprioception RUE Coordination: decreased fine motor, decreased gross motor  Lower Extremity Assessment: RLE deficits/detail RLE Deficits / Details: Patient demonstrates grossly 2+/5 strength in R LE. Unable to hold against gravity, reports decreased sensation RLE Sensation: decreased light touch RLE Coordination: decreased gross motor     ADLs   Overall ADL's : Needs assistance/impaired Grooming: Moderate assistance, Cueing for sequencing, Standing Grooming Details (indicate cue type and reason): Mod A in utilizing affected UE to bring hand to face. Mod cueing for reminding pt of task and sequencing Upper Body Bathing: Sitting, Cueing for safety, Maximal assistance Upper Body Bathing Details (indicate cue type and reason): max A due to lack of ROM/strength in RUE and impaired sitting balance Lower Body Bathing: Maximal assistance, Bed level, Cueing for sequencing Upper Body Dressing : Maximal assistance, Cueing for compensatory techniques Upper Body Dressing Details (indicate cue type and reason): Max A with verbal cues to use compensatory techniques to thread affected arm through gown with non-affected arm.  Lower Body Dressing: Sit to/from stand, Total assistance, +2 for physical assistance Toilet Transfer: Moderate assistance, +2 for physical assistance, +2 for safety/equipment, Cueing for sequencing, Minimal assistance, RW Toilet Transfer Details (indicate cue type and reason): simulated  with recliner. Mod A +2 for stabilzing  without walker and min A +2 with walker. Mod cueing for hand placement and steps.  Toileting- Water quality scientist and Hygiene: Cueing for safety, Cueing for sequencing, Sit to/from stand, Total assistance, +2 for physical assistance Tub/ Shower Transfer:  (deferred due to safety) Functional mobility during ADLs: Moderate assistance, +2 for physical assistance, +2 for safety/equipment, Cueing for sequencing General ADL Comments: Pt mod - min A +2 for ADLs due to weakness on R side, decreased motor planning, and decreased cognition needing mod verbal cues for hand placement and correcting posture in seated and standing     Mobility   Overal bed mobility: Needs Assistance Bed Mobility: Supine to Sit Supine to sit: Mod assist, HOB elevated Sit to supine: Mod assist, +2 for physical assistance General bed mobility comments: cues for sequencing and use of rials and to elevate trunk into sitting      Transfers   Overall transfer level: Needs assistance Equipment used: Rolling walker (2 wheeled) Transfers: Sit to/from Stand Sit to Stand: +2 physical assistance, Mod assist, Min assist, +2 safety/equipment General transfer comment: stood pt x2. First stand min A +2 and with fatigue needing Mod A +2 for R knee blocking and R UE positioning to bear weight     Ambulation / Gait / Stairs / Wheelchair Mobility   Ambulation/Gait General Gait Details: unable at this time     Posture / Balance Dynamic Sitting Balance Sitting balance - Comments: pt leaning forward and the right needing verbal cues to fix posture. Balance Overall balance assessment: Needs assistance Sitting-balance support: Single extremity supported, Feet supported Sitting balance-Leahy Scale: Poor Sitting balance - Comments: pt leaning forward and the right needing verbal cues to fix posture. Postural control: Right lateral lean (anterior) Standing balance support: Bilateral upper extremity supported, During functional  activity Standing balance-Leahy Scale: Poor Standing balance comment: unable to get fully standing. Requires +2 mod/max assist     Special needs/care consideration Designated visitor Kevin [name] (wife)        Previous Home Environment (from acute therapy documentation) Living Arrangements: Spouse/significant other  Lives With: Spouse Available Help at Discharge: Family Type of Home: House Home Layout: One level Alternate Level Stairs-Rails: Right Home Access: Stairs to enter Entrance Stairs-Rails: None Entrance Stairs-Number of Steps: 1 Bathroom Shower/Tub: Chiropodist: Handicapped height Bathroom Accessibility: Yes How Accessible: Accessible via walker [place]: No Additional Comments: Patient was using cane prior to admission due to prior CVA   Discharge Living Setting Plans for Discharge Living Setting: Patient's home Type of Home at Discharge: House Discharge Home Layout: One level Discharge Home Access: Stairs to enter [place], Number of Steps: 1 Discharge Bathroom Shower/Tub: Tub/shower unit Discharge Bathroom Toilet: Handicapped Height Discharge Bathroom Accessibility: Yes How Accessible: Accessible via walker Does the patient have any problems obtaining your medications?: No   Social/Family/Support Systems Patient Roles: Spouse Contact Information: [phone] Anticipated Caregiver: [place] Retana Anticipated Caregiver's Contact Information: [phone] Caregiver Availability: Pt.'s wife states that while she plans to return to work in the fall, she will get family and caregivers to assist with 24/7 support.  Discharge Plan Discussed with Primary Caregiver: Yes     Goals Patient/Family Goal for Rehab: Min/Mod PT OT; Supervision SLP Expected length of stay: 20-24 days Pt/Family Agrees to Admission and willing to participate: Yes Program Orientation Provided & Reviewed with Pt/Caregiver Including Roles  &  Responsibilities: Yes     Decrease burden of Care through  IP rehab admission: Specialzed equipment needs and Patient/family education     Possible need for SNF placement upon discharge: Not anticipated     Patient Condition: This patient's medical and functional status has changed since the consult dated: 03/03/2020 in which the Rehabilitation Physician determined and documented that the patient's condition is appropriate for intensive rehabilitative care in an inpatient rehabilitation facility. See "History of Present Illness" (above) for medical update. Functional changes are: none. Patient's medical and functional status update has been discussed with the Rehabilitation physician and patient remains appropriate for inpatient rehabilitation. Will admit to inpatient rehab today.   Preadmission Screen Completed By:  Raechel Ache, OT, with day of admit updates completed by Raechel Ache OTR/L on  03/06/2020 1:37 PM ______________________________________________________________________     Discussed status with Kevin [name] on 03/06/20 at 1:36PM and received approval for admission today.   Admission Coordinator:  Kevin [name], with day of admit updates completed by Raechel Ache OTR/L  at time 1:36PM/Date 03/06/20.            Cosigned by: Kevin Blake, MD at 03/06/2020  1:59 PM  Revision History                                                   Note Details  Author Raechel Ache, OT File Time 03/06/2020  1:37 PM  Author Type Rehab Admission Coordinator Status Signed  Last Editor Raechel Ache, [facility] [place] # [phone] [place] Date 03/06/2020

## 2020-03-06 NOTE — TOC Transition Note (Signed)
Transition of Care [facility]) - CM/SW Discharge Note   Patient Details  Name: Kevin [name] MRN: [id number] Date of Birth: 11-21-1950  [facility]) CM/SW Contact:  Kermit Balo, RN Phone Number: 03/06/2020, 1:26 PM   Clinical Narrative:    Pt discharging to CIR today. CM signing off.   Final next level of care: IP Rehab Facility Barriers to Discharge: No Barriers Identified   Patient Goals and CMS Choice        Discharge Placement                       Discharge Plan and Services                                     Social Determinants of Health (SDOH) Interventions     Readmission Risk Interventions No flowsheet data found.

## 2020-03-07 ENCOUNTER — Inpatient Hospital Stay (HOSPITAL_COMMUNITY): Payer: Medicare HMO | Admitting: Occupational Therapy

## 2020-03-07 ENCOUNTER — Inpatient Hospital Stay (HOSPITAL_COMMUNITY): Payer: Medicare HMO | Admitting: Speech Pathology

## 2020-03-07 ENCOUNTER — Other Ambulatory Visit: Payer: Self-pay | Admitting: *Deleted

## 2020-03-07 ENCOUNTER — Inpatient Hospital Stay (HOSPITAL_COMMUNITY): Payer: Medicare HMO | Admitting: Physical Therapy

## 2020-03-07 ENCOUNTER — Telehealth: Payer: Self-pay | Admitting: Pulmonary Disease

## 2020-03-07 DIAGNOSIS — N1832 Chronic kidney disease, stage 3b: Secondary | ICD-10-CM

## 2020-03-07 DIAGNOSIS — I10 Essential (primary) hypertension: Secondary | ICD-10-CM

## 2020-03-07 DIAGNOSIS — G4733 Obstructive sleep apnea (adult) (pediatric): Secondary | ICD-10-CM

## 2020-03-07 DIAGNOSIS — R569 Unspecified convulsions: Secondary | ICD-10-CM

## 2020-03-07 DIAGNOSIS — K5901 Slow transit constipation: Secondary | ICD-10-CM

## 2020-03-07 DIAGNOSIS — I5022 Chronic systolic (congestive) heart failure: Secondary | ICD-10-CM

## 2020-03-07 DIAGNOSIS — D696 Thrombocytopenia, unspecified: Secondary | ICD-10-CM

## 2020-03-07 LAB — COMPREHENSIVE METABOLIC PANEL
ALT: 24 U/L (ref 0–44)
AST: 25 U/L (ref 15–41)
Albumin: 3.1 g/dL — ABNORMAL LOW (ref 3.5–5.0)
Alkaline Phosphatase: 66 U/L (ref 38–126)
Anion gap: 9 (ref 5–15)
BUN: 15 mg/dL (ref 8–23)
CO2: 22 mmol/L (ref 22–32)
Calcium: 8.8 mg/dL — ABNORMAL LOW (ref 8.9–10.3)
Chloride: 109 mmol/L (ref 98–111)
Creatinine, Ser: 1.4 mg/dL — ABNORMAL HIGH (ref 0.61–1.24)
GFR calc Af Amer: 59 mL/min — ABNORMAL LOW (ref >60)
GFR calc non Af Amer: 51 mL/min — ABNORMAL LOW (ref >60)
Glucose, Bld: 126 mg/dL — ABNORMAL HIGH (ref 70–99)
Potassium: 3.8 mmol/L (ref 3.5–5.1)
Sodium: 140 mmol/L (ref 135–145)
Total Bilirubin: 1.3 mg/dL — ABNORMAL HIGH (ref 0.3–1.2)
Total Protein: 6.5 g/dL (ref 6.5–8.1)

## 2020-03-07 LAB — CBC WITH DIFFERENTIAL/PLATELET
Abs Immature Granulocytes: 0.02 10*3/uL (ref 0.00–0.07)
Basophils Absolute: 0 10*3/uL (ref 0.0–0.1)
Basophils Relative: 0 %
Eosinophils Absolute: 0.2 10*3/uL (ref 0.0–0.5)
Eosinophils Relative: 3 %
HCT: 45.3 % (ref 39.0–52.0)
Hemoglobin: 15.5 g/dL (ref 13.0–17.0)
Immature Granulocytes: 0 %
Lymphocytes Relative: 5 %
Lymphs Abs: 0.3 10*3/uL — ABNORMAL LOW (ref 0.7–4.0)
MCH: 34 pg (ref 26.0–34.0)
MCHC: 34.2 g/dL (ref 30.0–36.0)
MCV: 99.3 fL (ref 80.0–100.0)
Monocytes Absolute: 0.8 10*3/uL (ref 0.1–1.0)
Monocytes Relative: 13 %
Neutro Abs: 5.4 10*3/uL (ref 1.7–7.7)
Neutrophils Relative %: 79 %
Platelets: 82 10*3/uL — ABNORMAL LOW (ref 150–400)
RBC: 4.56 MIL/uL (ref 4.22–5.81)
RDW: 11.5 % (ref 11.5–15.5)
WBC: 6.7 10*3/uL (ref 4.0–10.5)
nRBC: 0 % (ref 0.0–0.2)

## 2020-03-07 MED ORDER — POLYETHYLENE GLYCOL 3350 17 G PO PACK
17.0000 g | PACK | Freq: Two times a day (BID) | ORAL | Status: DC
  Administered 2020-03-07 – 2020-03-09 (×6): 17 g via ORAL
  Filled 2020-03-07 (×7): qty 1

## 2020-03-07 NOTE — Plan of Care (Signed)
  Problem: Consults Goal: RH STROKE PATIENT EDUCATION Description: See Patient Education module for education specifics  Outcome: Progressing   Problem: RH BOWEL ELIMINATION Goal: RH STG MANAGE BOWEL WITH ASSISTANCE Description: STG Manage Bowel with min Assistance. Outcome: Progressing   Problem: RH BLADDER ELIMINATION Goal: RH STG MANAGE BLADDER WITH ASSISTANCE Description: STG Manage Bladder With min Assistance Outcome: Progressing   Problem: RH SAFETY Goal: RH STG ADHERE TO SAFETY PRECAUTIONS W/ASSISTANCE/DEVICE Description: STG Adhere to Safety Precautions With cues and reminders. Outcome: Progressing   Problem: RH COGNITION-NURSING Goal: RH STG USES MEMORY AIDS/STRATEGIES W/ASSIST TO PROBLEM SOLVE Description: STG Uses Memory Aids/Strategies With supervision Assistance to Problem Solve. Outcome: Progressing   Problem: RH PAIN MANAGEMENT Goal: RH STG PAIN MANAGED AT OR BELOW PT'S PAIN GOAL Description: Pain level less than 4 on scale of 0-10 Outcome: Progressing   Problem: RH KNOWLEDGE DEFICIT Goal: RH STG INCREASE KNOWLEDGE OF HYPERTENSION Description: Pt will be able to adhere to medication regimen, dietary restriction and lifestyle modifications to better control hypertension and further complications with min assist from family upon discharge.  Outcome: Progressing Goal: RH STG INCREASE KNOWLEGDE OF HYPERLIPIDEMIA Description: Pt will be able to adhere to medication regimen, dietary restriction and lifestyle modifications to prevent high cholesterol and further complications with min assist from family upon discharge.  Outcome: Progressing Goal: RH STG INCREASE KNOWLEDGE OF STROKE PROPHYLAXIS Description: Pt will be able to adhere to medication regimen, dietary restriction and lifestyle modifications to prevent stroke and further complications with min assist from family upon discharge.  Outcome: Progressing   

## 2020-03-07 NOTE — Evaluation (Signed)
Physical Therapy Assessment and Plan  Patient Details  Name: Kevin [name] MRN: [id number] Date of Birth: September 11, 1951  PT Diagnosis: Abnormal posture, Abnormality of gait, Cognitive deficits, Difficulty walking, Hemiplegia dominant, Impaired cognition and Muscle weakness Rehab Potential: Good ELOS: 3-4 weeks   Today's Date: 03/07/2020 PT Individual Time: 0800-0919 PT Individual Time Calculation (min): 79 min    Problem List:  Patient Active Problem List   Diagnosis Date Noted  . Slow transit constipation   . Chronic systolic congestive heart failure ([place])   . Benign essential HTN   . Embolic stroke ([place]) 54/27/0623  . Cerebrovascular accident (CVA) due to embolism of cerebral artery ([place]) 03/03/2020  . Acute focal neurological deficit 03/02/2020  . Liver cirrhosis ([place])   . Metabolic encephalopathy   . Chronic systolic CHF (congestive heart failure) ([place])   . Paroxysmal atrial fibrillation (H[facility])   . Essential hypertension   . HCV antibody positive   . Secondary hypertension, unspecified   . Elevated troponin   . Blood pressure instability   . CAD in native artery cath 2016 90% mRamus  02/01/2016  . Atrial fibrillation, permanent ([place]) 02/01/2016  . NSTEMI (non-ST elevated myocardial infarction) ([place])   . Status epilepticus ([place]) 01/31/2016  . High anion gap metabolic acidosis   . Lactic acidosis   . Cerebral infarction due to unspecified mechanism   . Hypertensive emergency   . Seizures ([place])   . CVA (cerebral infarction)   . TIA (transient ischemic attack) 08/23/2015  . CKD (chronic kidney disease) stage 3, GFR 30-59 ml/min 08/23/2015  . Acute ischemic stroke ([place]) 08/23/2015  . Abnormal nuclear stress test 09/27/2014  . H/O medication noncompliance 09/27/2014  . Unstable angina ([place]) 09/27/2014  . Persistent atrial fibrillation ([place]) 08/23/2014  . Chronic diastolic heart failure ([place]) 08/23/2014  . Substance abuse ([place]) 08/23/2014  . Erectile dysfunction  08/23/2014  . Cirrhosis of liver ([place]) 04/23/2014  . DOE (dyspnea on exertion) 04/19/2014  . Atrial fibrillation with RVR ([place]) 04/19/2014  . Dysphagia 04/19/2014  . Obesity 05/02/2012  . NSVT (nonsustained ventricular tachycardia) ([place]) 05/02/2012  . Thrombocytopenia ([place]) 05/02/2012  . Cholelithiases 05/01/2012  . Chest pain, no significant macrovascular CAD at cath 05/01/12; Likely due to elevated LVEDP & Afib with Diastolic HF. 76/28/3151  . Atrial fibrillation with RVR, converted to SR with TEE and DCCV 05/05/12 04/30/2012  . PAD (peripheral artery disease), PTA to Lt. SFA in 2009, total occlusion mid Rt SFA with collaterals  04/30/2012  . Cocaine abuse ([place]) 04/30/2012  . HTN (hypertension) -Accelerated with End organ involvement 04/30/2012  . Tobacco abuse 04/30/2012  . ETOH abuse 04/30/2012  . Hepatitis C antibody test positive 04/23/2012    Past Medical History:  Past Medical History:  Diagnosis Date  . Atrial fibrillation, permanent ([place]) 02/01/2016  . Blood transfusion without reported diagnosis   . CAD in native artery cath 2016 90% mRamus  02/01/2016  . CHF (congestive heart failure) ([place])   . Cholelithiases 05/01/2012  . Claudication ([place])    bilateral  . Cocaine abuse ([place])   . Dysrhythmia    afib  . ETOH abuse   . Hepatitis C antibody test positive 04/2012  . Hyperpigmentation   . Hypertension   . PAD (peripheral artery disease) ([place])   . Persistent atrial fibrillation ([place]) 08/23/2014  . Thrombocytopenia ([place])   . Tobacco abuse    Past Surgical History:  Past Surgical History:  Procedure Laterality Date  . Cardiolite  negative for ischemia  . CARDIOVASCULAR STRESS TEST    . CARDIOVERSION  05/05/2012   Procedure: CARDIOVERSION;  Surgeon: Sanda Klein, MD;  Location: [facility] ENDOSCOPY;  Service: Cardiovascular;  Laterality: N/A;  . CAROTID ARTERY ANGIOPLASTY    . DOPPLER ECHOCARDIOGRAPHY    . ENDOVASCULAR STENT INSERTION  right leg  . LEFT HEART  CATHETERIZATION WITH CORONARY ANGIOGRAM N/A 05/01/2012   Procedure: LEFT HEART CATHETERIZATION WITH CORONARY ANGIOGRAM;  Surgeon: Leonie Man, MD;  Location: [facility] CATH LAB;  Service: Cardiovascular;  Laterality: N/A;  . LEFT HEART CATHETERIZATION WITH CORONARY ANGIOGRAM N/A 09/27/2014   Procedure: LEFT HEART CATHETERIZATION WITH CORONARY ANGIOGRAM;  Surgeon: Pixie Casino, MD;  Location: [facility] CATH LAB;  Service: Cardiovascular;  Laterality: N/A;  . TEE WITHOUT CARDIOVERSION  05/05/2012   Procedure: TRANSESOPHAGEAL ECHOCARDIOGRAM (TEE);  Surgeon: Sanda Klein, MD;  Location: [employer];  Service: Cardiovascular;  Laterality: N/A;    Assessment & Plan Clinical Impression: Kevin [name] is a 69 year old LH-male with history HTN, CAD w/chronic systolic CHF, CKD- SCr 1.9?, cirrhosis of the liver with chronic thrombocytopenia, polysubstance abuse, multifactorial SOB, CAF - on Eliquis, prior CVA/TIA, h/o seizures who was admitted on 03/02/20 after fall off the bed with slurred speech and noted to have right sided weakness at admission. CT head negative for bleed. CTA head/CT perfusion negative for LVO or perfusion deficits. He was loaded with Keppra due to concerns of Todd's paralysis. RI brain done revealing numerous acute cortical/subcortical embolic infarcts in Left ACA, MCA, PCA and watershed territories as well as multiple small acute infarcts in left basal ganglia and left thalamus with moderate generalized atrophy.   2D echo showed EF 60-65% with severe concentric LVH and normal LVH. EEG with mild slowing and no seizure activity. UDS positive for cocaine and THC. Dr. Leonie Man recommended contiuing Eliquis as well as risk factor modification. Patient continues to be limited by right sided weakness with poor safety awareness and decrease in problem solving affecting mobility and ADLs. CIR recommended due to functional decline.     Patient transferred to CIR on 03/06/2020 .   Patient currently  requires mod-maxA  with mobility secondary to muscle weakness, decreased cardiorespiratoy endurance, unbalanced muscle activation, decreased coordination and decreased motor planning, decreased midline orientation, decreased attention to right and decreased motor planning, decreased initiation, decreased attention, decreased awareness, decreased problem solving, decreased safety awareness, decreased memory and delayed processing and decreased sitting balance, decreased standing balance, decreased postural control, hemiplegia and decreased balance strategies.  Prior to hospitalization, patient was modified independent  with mobility and lived with Spouse in a House home.  Home access is 1Stairs to enter.  Patient will benefit from skilled PT intervention to maximize safe functional mobility, minimize fall risk and decrease caregiver burden for planned discharge home with 24 hour assist.  Anticipate patient will benefit from follow up [facility] at discharge.  PT - End of Session Activity Tolerance: Decreased this session Endurance Deficit: Yes PT Assessment Rehab Potential (ACUTE/IP ONLY): Good PT Barriers to Discharge: Behavior PT Barriers to Discharge Comments: cognition PT Patient demonstrates impairments in the following area(s): Balance;Endurance;Motor;Perception;Safety PT Transfers Functional Problem(s): Bed Mobility;Bed to Chair;Car;Furniture PT Locomotion Functional Problem(s): Ambulation;Wheelchair Mobility;Stairs PT Plan PT Intensity: Minimum of 1-2 x/day ,45 to 90 minutes PT Frequency: Total of 15 hours over 7 days of combined therapies PT Duration Estimated Length of Stay: 3-4 weeks PT Treatment/Interventions: Ambulation/gait training;Discharge planning;Functional mobility training;Psychosocial support;Therapeutic Activities;Visual/perceptual remediation/compensation;Balance/vestibular training;Disease management/prevention;Neuromuscular re-education;Skin care/wound management;Therapeutic  Exercise;Wheelchair  propulsion/positioning;Cognitive remediation/compensation;Pain management;DME/adaptive equipment instruction;Splinting/orthotics;UE/LE Strength taining/ROM;Community reintegration;Functional electrical stimulation;Patient/family education;Stair training;UE/LE Coordination activities PT Transfers Anticipated Outcome(s): MinA, LRAD PT Locomotion Anticipated Outcome(s): Min-ModA LRAD PT Recommendation Follow Up Recommendations: Home health PT;24 hour supervision/assistance Patient destination: Home Equipment Recommended: To be determined  Skilled Therapeutic Intervention Patient received in bed, sleeping but easily woken and willing to participate in therapy today. Able to roll side to side with ModA, then performed supine<->sit with MinA and required Min-ModA to maintain midline sitting at EOB today. Able to perform squat pivot to with left with MaxA and multiple mini-scoots, then tolerated standing in stedy with ModA for approximately 1 minute before fatiguing. Also able to attempt gait in parallel bars- gait trained perhaps 75f with MaxA and severe propioceptive impairments, at times standing on right leg/holding left leg out straight and stating "I feel like my right leg isn't there!". Very very fatigued at EOS and returned to bed in stedy. Left in bed with all needs met, bed alarm active this morning.   PT Evaluation Precautions/Restrictions Precautions Precautions: Fall Precaution Comments: fatigues VERY easily, need to watch O2 sats, R hemiplegia and neglect, impaired cognition Restrictions Weight Bearing Restrictions: No General Chart Reviewed: Yes Response to Previous Treatment: Patient with no complaints from previous session. Family/Caregiver Present: No Vital Signs Pain Pain Assessment Pain Scale: 0-10 Pain Score: 0-No pain Home Living/Prior Functioning Home Living Living Arrangements: Spouse/significant other Available Help at Discharge: Family;Available 24  hours/day Type of Home: House Home Access: Stairs to enter [employer] Steps: 1 Entrance Stairs-Rails: None Home Layout: One level Alternate Level Stairs-Rails: Right Bathroom Shower/Tub: TChiropodist Handicapped height Bathroom Accessibility: Yes Additional Comments: Patient was using cane prior to admission due to prior CVA  Lives With: Spouse Prior Function Level of Independence: Independent with basic ADLs;Independent with gait;Independent with transfers  Able to Take Stairs?: Yes Driving: Yes Vocation: Retired Comments: driving Vision/Perception  Vision - Assessment Additional Comments: need to further assess Perception Perception: Impaired Praxis Praxis: Impaired Praxis Impairment Details: Ideation;Initiation  Cognition Overall Cognitive Status: Impaired/Different from baseline Arousal/Alertness: Awake/alert Orientation Level: Oriented X4 Attention: Sustained Sustained Attention: Impaired Sustained Attention Impairment: Verbal basic;Functional basic Memory: Impaired Memory Impairment: Storage deficit;Retrieval deficit;Decreased short term memory Decreased Short Term Memory: Verbal basic;Functional basic Awareness: Impaired Awareness Impairment: Intellectual impairment Problem Solving: Impaired Problem Solving Impairment: Verbal basic;Functional basic Executive Function:  (all impaired due to lower level deficits) Safety/Judgment: Impaired Comments: Pt with decreased awareness of physical and cognitive impairments as well as decreased initiation and comprehension, which present safety concern Sensation Sensation Light Touch: Impaired by gross assessment Hot/Cold: Not tested Proprioception: Impaired by gross assessment Additional Comments: definite impaired proprioception based on functional mobility today- stated "my right leg feels like it is gone" Coordination Gross Motor Movements are Fluid and Coordinated: No Fine Motor  Movements are Fluid and Coordinated: No Coordination and Movement Description: limited by hemiplegia and weakness R Motor  Motor Motor: Hemiplegia;Motor apraxia Motor - Skilled Clinical Observations: R hemiplegia, poor initiation and possibly some mild apraxia  Mobility Bed Mobility Bed Mobility: Rolling Right;Rolling Left;Supine to Sit;Sit to Supine Rolling Right: Moderate Assistance - Patient 50-74% Rolling Left: Moderate Assistance - Patient 50-74% Supine to Sit: Minimal Assistance - Patient > 75% Sit to Supine: Minimal Assistance - Patient > 75% Transfers Transfers: Sit to Stand;Stand to Sit;Stand Pivot Transfers;Squat Pivot Transfers Sit to Stand: Moderate Assistance - Patient 50-74% (stedy) Stand to Sit: Moderate Assistance - Patient 50-74% (stedy) Stand Pivot Transfers: Dependent -  mechanical lift (stedy) Stand Pivot Transfer Details: Tactile cues for placement;Manual facilitation for weight shifting;Verbal cues for technique;Verbal cues for precautions/safety;Visual cues/gestures for sequencing;Visual cues/gestures for precautions/safety;Manual facilitation for weight bearing Squat Pivot Transfers: Maximal Assistance - Patient 25-49% Locomotion  Gait Ambulation: Yes Gait Assistance: Maximal Assistance - Patient 25-49% Gait Distance (Feet): 2 Feet Assistive device: Parallel bars Gait Assistance Details: Manual facilitation for weight shifting;Manual facilitation for placement;Verbal cues for precautions/safety;Verbal cues for technique;Verbal cues for safe use of DME/AE;Visual cues/gestures for sequencing;Verbal cues for sequencing;Verbal cues for gait pattern;Visual cues/gestures for precautions/safety Gait Gait: Yes Gait Pattern: Impaired Gait Pattern: Step-through pattern;Decreased step length - left;Decreased stance time - right;Right flexed knee in stance;Left flexed knee in stance;Trendelenburg;Lateral hip instability;Decreased trunk rotation;Trunk flexed Gait velocity:  decreased Stairs / Additional Locomotion Stairs: No Wheelchair Mobility Wheelchair Mobility: No  Trunk/Postural Assessment  Cervical Assessment Cervical Assessment: Within Water engineer Thoracic Assessment: Within Functional Limits Lumbar Assessment Lumbar Assessment: Within Functional Limits Postural Control Postural Control: Deficits on evaluation  Balance Balance Balance Assessed: Yes Static Sitting Balance Static Sitting - Level of Assistance: 4: Min assist Dynamic Sitting Balance Dynamic Sitting - Level of Assistance: 3: Mod assist Sitting balance - Comments: tends to lean forward in chair due to fixating on socks Static Standing Balance Static Standing - Level of Assistance: 4: Min assist Dynamic Standing Balance Dynamic Standing - Level of Assistance: 2: Max assist Extremity Assessment  RUE Assessment General Strength Comments: hemiplegic with MMT estimated to be 2+ to 3-/5 based on functional assessment LUE Assessment LUE Assessment: Within Functional Limits RLE Assessment RLE Assessment: Exceptions to [facility] Active Range of Motion (AROM) Comments: WFL General Strength Comments: grossly 2+ to 3-/5 based on functional assessment LLE Assessment LLE Assessment: Within Functional Limits    Refer to Care Plan for Long Term Goals  Recommendations for other services: None   Discharge Criteria: Patient will be discharged from PT if patient refuses treatment 3 consecutive times without medical reason, if treatment goals not met, if there is a change in medical status, if patient makes no progress towards goals or if patient is discharged from hospital.  The above assessment, treatment plan, treatment alternatives and goals were discussed and mutually agreed upon: by patient  Windell Norfolk, DPT, PN1   Supplemental Physical Therapist [place]    Pager [phone] Acute Rehab Office [phone]

## 2020-03-07 NOTE — Progress Notes (Signed)
Inpatient Rehabilitation Center Individual Statement of Services  Patient Name:  Kevin [name]  Date:  03/07/2020  Welcome to the Inpatient Rehabilitation Center.  Our goal is to provide you with an individualized program based on your diagnosis and situation, designed to meet your specific needs.  With this comprehensive rehabilitation program, you will be expected to participate in at least 3 hours of rehabilitation therapies Monday-Friday, with modified therapy programming on the weekends.  Your rehabilitation program will include the following services:  Physical Therapy (PT), Occupational Therapy (OT), Speech Therapy (ST), 24 hour per day rehabilitation nursing, Therapeutic Recreaction (TR), Neuropsychology, Care Coordinator, Rehabilitation Medicine, Nutrition Services, Pharmacy Services and Other  Weekly team conferences will be held on Wednesdays to discuss your progress.  Your Inpatient Rehabilitation Care Coordinator will talk with you frequently to get your input and to update you on team discussions.  Team conferences with you and your family in attendance may also be held.  Expected length of stay: 20-24 Days  Overall anticipated outcome: Min/Mod   Depending on your progress and recovery, your program may change. Your Inpatient Rehabilitation Care Coordinator will coordinate services and will keep you informed of any changes. Your Inpatient Rehabilitation Care Coordinator's name and contact numbers are listed  below.  The following services may also be recommended but are not provided by the Inpatient Rehabilitation Center:    Home Health Rehabiltiation Services  Outpatient Rehabilitation Services    Arrangements will be made to provide these services after discharge if needed.  Arrangements include referral to agencies that provide these services.  Your insurance has been verified to be:  [employer] Your primary doctor is:  [name], [place]  Pertinent information will be  shared with your doctor and your insurance company.  Inpatient Rehabilitation Care Coordinator:  Lavera Guise, [place] [phone] or [phone]  Information discussed with and copy given to patient by: Andria Rhein, 03/07/2020, 11:52 AM

## 2020-03-07 NOTE — Patient Outreach (Signed)
[facility]) Care Management  03/07/2020  Kevin [name] 04/29/1951 [id number]   Referral received : 03/07/20 Referral source: Notification of inpatient discharge. Date of Admission : 03/02/20 Diagnosis : Acute Ischemic stroke Facility : [facility]  Insurance: [facility]     Noted patient discharged from [name] inpatient stay to Inpatient rehab at [facility] on 03/06/20.   Plan  Patient will be followed by [facility] hospital liaison for discharge disposition.    [name], RN, BSN  [facility] Care Management,Care Management Coordinator  [phone]- Mobile [phone]- Toll Free Main Office

## 2020-03-07 NOTE — Plan of Care (Signed)
  Problem: Consults Goal: RH STROKE PATIENT EDUCATION Description: See Patient Education module for education specifics  Outcome: Progressing   Problem: RH BOWEL ELIMINATION Goal: RH STG MANAGE BOWEL WITH ASSISTANCE Description: STG Manage Bowel with min Assistance. Outcome: Progressing   Problem: RH BLADDER ELIMINATION Goal: RH STG MANAGE BLADDER WITH ASSISTANCE Description: STG Manage Bladder With min Assistance Outcome: Progressing   Problem: RH SAFETY Goal: RH STG ADHERE TO SAFETY PRECAUTIONS W/ASSISTANCE/DEVICE Description: STG Adhere to Safety Precautions With cues and reminders. Outcome: Progressing   Problem: RH COGNITION-NURSING Goal: RH STG USES MEMORY AIDS/STRATEGIES W/ASSIST TO PROBLEM SOLVE Description: STG Uses Memory Aids/Strategies With supervision Assistance to Problem Solve. Outcome: Progressing   Problem: RH PAIN MANAGEMENT Goal: RH STG PAIN MANAGED AT OR BELOW PT'S PAIN GOAL Description: Pain level less than 4 on scale of 0-10 Outcome: Progressing   Problem: RH KNOWLEDGE DEFICIT Goal: RH STG INCREASE KNOWLEDGE OF HYPERTENSION Description: Pt will be able to adhere to medication regimen, dietary restriction and lifestyle modifications to better control hypertension and further complications with min assist from family upon discharge.  Outcome: Progressing Goal: RH STG INCREASE KNOWLEGDE OF HYPERLIPIDEMIA Description: Pt will be able to adhere to medication regimen, dietary restriction and lifestyle modifications to prevent high cholesterol and further complications with min assist from family upon discharge.  Outcome: Progressing Goal: RH STG INCREASE KNOWLEDGE OF STROKE PROPHYLAXIS Description: Pt will be able to adhere to medication regimen, dietary restriction and lifestyle modifications to prevent stroke and further complications with min assist from family upon discharge.  Outcome: Progressing

## 2020-03-07 NOTE — Evaluation (Addendum)
Occupational Therapy Assessment and Plan  Patient Details  Name: Kevin [name] MRN: [id number] Date of Birth: 08-06-1951  OT Diagnosis: altered mental status, apraxia, cognitive deficits, disturbance of vision, hemiplegia affecting non-dominant side and muscle weakness (generalized) Rehab Potential:  good ELOS:   3 - 4 weeks  Today's Date: 03/07/2020 OT Individual Time: 1300-1405 OT Individual Time Calculation (min): 65 min     Problem List:  Patient Active Problem List   Diagnosis Date Noted  . Slow transit constipation   . Chronic systolic congestive heart failure ([place])   . Benign essential HTN   . Embolic stroke ([place]) 09/98/3382  . Cerebrovascular accident (CVA) due to embolism of cerebral artery ([place]) 03/03/2020  . Acute focal neurological deficit 03/02/2020  . Liver cirrhosis ([place])   . Metabolic encephalopathy   . Chronic systolic CHF (congestive heart failure) ([place])   . Paroxysmal atrial fibrillation ([facility])   . Essential hypertension   . HCV antibody positive   . Secondary hypertension, unspecified   . Elevated troponin   . Blood pressure instability   . CAD in native artery cath 2016 90% mRamus  02/01/2016  . Atrial fibrillation, permanent ([place]) 02/01/2016  . NSTEMI (non-ST elevated myocardial infarction) ([place])   . Status epilepticus ([place]) 01/31/2016  . High anion gap metabolic acidosis   . Lactic acidosis   . Cerebral infarction due to unspecified mechanism   . Hypertensive emergency   . Seizures ([place])   . CVA (cerebral infarction)   . TIA (transient ischemic attack) 08/23/2015  . CKD (chronic kidney disease) stage 3, GFR 30-59 ml/min 08/23/2015  . Acute ischemic stroke ([place]) 08/23/2015  . Abnormal nuclear stress test 09/27/2014  . H/O medication noncompliance 09/27/2014  . Unstable angina ([place]) 09/27/2014  . Persistent atrial fibrillation ([place]) 08/23/2014  . Chronic diastolic heart failure ([place]) 08/23/2014  . Substance abuse ([place]) 08/23/2014  . Erectile  dysfunction 08/23/2014  . Cirrhosis of liver ([place]) 04/23/2014  . DOE (dyspnea on exertion) 04/19/2014  . Atrial fibrillation with RVR ([place]) 04/19/2014  . Dysphagia 04/19/2014  . Obesity 05/02/2012  . NSVT (nonsustained ventricular tachycardia) ([place]) 05/02/2012  . Thrombocytopenia ([place]) 05/02/2012  . Cholelithiases 05/01/2012  . Chest pain, no significant macrovascular CAD at cath 05/01/12; Likely due to elevated LVEDP & Afib with Diastolic HF. 50/53/9767  . Atrial fibrillation with RVR, converted to SR with TEE and DCCV 05/05/12 04/30/2012  . PAD (peripheral artery disease), PTA to Lt. SFA in 2009, total occlusion mid Rt SFA with collaterals  04/30/2012  . Cocaine abuse ([place]) 04/30/2012  . HTN (hypertension) -Accelerated with End organ involvement 04/30/2012  . Tobacco abuse 04/30/2012  . ETOH abuse 04/30/2012  . Hepatitis C antibody test positive 04/23/2012    Past Medical History:  Past Medical History:  Diagnosis Date  . Atrial fibrillation, permanent ([place]) 02/01/2016  . Blood transfusion without reported diagnosis   . CAD in native artery cath 2016 90% mRamus  02/01/2016  . CHF (congestive heart failure) ([place])   . Cholelithiases 05/01/2012  . Claudication ([place])    bilateral  . Cocaine abuse ([place])   . Dysrhythmia    afib  . ETOH abuse   . Hepatitis C antibody test positive 04/2012  . Hyperpigmentation   . Hypertension   . PAD (peripheral artery disease) ([place])   . Persistent atrial fibrillation ([place]) 08/23/2014  . Thrombocytopenia ([place])   . Tobacco abuse    Past Surgical History:  Past Surgical History:  Procedure Laterality Date  .  Cardiolite     negative for ischemia  . CARDIOVASCULAR STRESS TEST    . CARDIOVERSION  05/05/2012   Procedure: CARDIOVERSION;  Surgeon: Sanda Klein, MD;  Location: [facility] ENDOSCOPY;  Service: Cardiovascular;  Laterality: N/A;  . CAROTID ARTERY ANGIOPLASTY    . DOPPLER ECHOCARDIOGRAPHY    . ENDOVASCULAR STENT INSERTION  right leg  . LEFT HEART  CATHETERIZATION WITH CORONARY ANGIOGRAM N/A 05/01/2012   Procedure: LEFT HEART CATHETERIZATION WITH CORONARY ANGIOGRAM;  Surgeon: Leonie Man, MD;  Location: [facility] CATH LAB;  Service: Cardiovascular;  Laterality: N/A;  . LEFT HEART CATHETERIZATION WITH CORONARY ANGIOGRAM N/A 09/27/2014   Procedure: LEFT HEART CATHETERIZATION WITH CORONARY ANGIOGRAM;  Surgeon: Pixie Casino, MD;  Location: [facility] CATH LAB;  Service: Cardiovascular;  Laterality: N/A;  . TEE WITHOUT CARDIOVERSION  05/05/2012   Procedure: TRANSESOPHAGEAL ECHOCARDIOGRAM (TEE);  Surgeon: Sanda Klein, MD;  Location: [facility] ENDOSCOPY;  Service: Cardiovascular;  Laterality: N/A;    Assessment & Plan Clinical Impression: Patient is a 69 y.o. year old male with recent admission to the hospital on 03/02/20 with history HTN, CAD w/chronic systolic CHF, CKD- SCr 1.9?, cirrhosis of the liver with chronic thrombocytopenia, polysubstance abuse, multifactorial SOB, CAF - on Eliquis, prior CVA/TIA, h/o seizures who was admitted on 03/02/20 after fall off the bed with slurred speech and noted to have right sided weakness at admission. CT head negative for bleed. CTA head/CT perfusion negative for LVO or perfusion deficits. He was loaded with Keppra due to concerns of Todd's paralysis. RI brain done revealing numerous acute cortical/subcortical embolic infarcts in Left ACA, MCA, PCA and watershed territories as well as multiple small acute infarcts in left basal ganglia and left thalamus with moderate generalized atrophy.   2D echo showed EF 60-65% with severe concentric LVH and normal LVH. EEG with mild slowing and no seizure activity. UDS positive for cocaine and THC. Dr. Leonie Man recommended contiuing Eliquis as well as risk factor modification. Patient continues to be limited by right sided weakness with poor safety awareness and decrease in problem solving affecting mobility and ADLs. CIR recommended due to functional decline.     Patient currently  requires mod to max with basic self-care skills secondary to muscle weakness, decreased cardiorespiratoy endurance, motor apraxia, decreased coordination and decreased motor planning, decreased visual acuity, decreased visual perceptual skills and decreased visual motor skills, decreased attention to right and right side neglect, decreased initiation, decreased awareness, decreased problem solving, decreased safety awareness, decreased memory and delayed processing and decreased sitting balance, decreased standing balance, hemiplegia and decreased balance strategies.  Prior to hospitalization, patient could complete BADLs with min.  Patient will benefit from skilled intervention to decrease level of assist with basic self-care skills prior to discharge home with care partner.  Anticipate patient will require 24 hour supervision and follow up home health.   OT - End of Session  Endurance Deficit: Yes OT Assessment Rehab Potential (ACUTE ONLY): Good OT Patient demonstrates impairments in the following area(s): Balance;Cognition;Endurance;Motor;Perception;Safety;Sensory;Vision OT Basic ADL's Functional Problem(s): Eating;Grooming;Bathing;Dressing;Toileting OT Transfers Functional Problem(s): Toilet;Tub/Shower OT Additional Impairment(s): Fuctional Use of Upper Extremity OT Plan OT Frequency: Total of 15 hours over 7 days of combined therapies OT Duration/Estimated Length of Stay: 3-4 weeks OT Treatment/Interventions: Balance/vestibular training;Discharge planning;Self Care/advanced ADL retraining;Therapeutic Activities;UE/LE Coordination activities;Cognitive remediation/compensation;Functional mobility training;Patient/family education;Therapeutic Exercise;Visual/perceptual remediation/compensation;DME/adaptive equipment instruction;Neuromuscular re-education;UE/LE Strength taining/ROM;Wheelchair propulsion/positioning OT Self Feeding Anticipated Outcome(s): setup OT Basic Self-Care Anticipated  Outcome(s): min assist OT Toileting Anticipated Outcome(s): min assist OT Bathroom Transfers Anticipated  Outcome(s): CGA OT Recommendation Patient destination: Home Follow Up Recommendations: Home health OT Equipment Recommended: To be determined Activity Tolerance: Tolerates < 10 min activity with changes in vital signs Endurance Deficit: Yes   Skilled Therapeutic Intervention Pt sitting upright in bed attempting to self feed lunch.  Pt noted with chewing same bite of food for a few minutes without swallowing and intermittently falling asleep, needing max VCs to facilitate safe swallowing.  Pt self fed cup of lemonade with independence.  Pt agreeable to washing up at sink.  Mod assist supine to sit and mod assist squat pivot transfer EOB to w/c.  Pt leaning forward needing VCs and TCs to reposition posteriorly with pelvis and trunk for safe transport to sink.  Pt washed UB needing repetitive VCs and TCs to initiate task due to perseverating on wetting washcloth.  Pt washed  chest and stomach with supervision, and RUE with max assist to lift away from body to reach underarm ,entire LUE, and back.  Pt washed upper legs and needing mod assist to lift LE into figure 4 position to wash feet and ankles.  Pt donned shirt with initial visual demonstration on hemitechnique provided and pt return demo with mod assist.  Pt educated on compensatory technique to donn shorts with mod assist for threading over feet and pulling over hips in standing.  Pt requesting to return to bed. SPO2 monitored throughout session ranging 88-96% on room air.  Pt educated intermittently as needed on PLB technique with rapid return to high 90s. Pt completed stand pivot transfer without AD needing min assist and VCs/TCs for weight shifting.  HOB elevated, call bell in reach, bed alarm on.    OT Evaluation Precautions/Restrictions  Precautions Precautions: Fall Precaution Comments: fatigues VERY easily, need to watch O2 sats, R  hemiplegia and neglect, impaired cognition Restrictions Weight Bearing Restrictions: No General Chart Reviewed: Yes Pain Pain Assessment Pain Scale: 0-10 Pain Score: 0-No pain Home Living/Prior Functioning Home Living Living Arrangements: Spouse/significant other Available Help at Discharge: Family, Available 24 hours/day Type of Home: House Home Access: Stairs to enter [employer] of Steps: 1 Entrance Stairs-Rails: None Home Layout: One level Alternate Level Stairs-Rails: Right Bathroom Shower/Tub: Chiropodist: Handicapped height Bathroom Accessibility: Yes Additional Comments: Patient was using cane prior to admission due to prior CVA  Lives With: Spouse Prior Function Level of Independence: Independent with basic ADLs, Independent with gait, Independent with transfers  Able to Take Stairs?: Yes Driving: Yes Vocation: Retired Comments: driving ADL ADL Eating: Moderate cueing, Set up, Supervision/safety Where Assessed-Eating: Bed level Grooming: Minimal assistance Where Assessed-Grooming: Sitting at sink Upper Body Bathing: Moderate assistance Where Assessed-Upper Body Bathing: Sitting at sink Lower Body Bathing: Maximal assistance Where Assessed-Lower Body Bathing: Sitting at sink Upper Body Dressing: Moderate assistance Where Assessed-Upper Body Dressing: Sitting at sink Lower Body Dressing: Moderate assistance Where Assessed-Lower Body Dressing: Standing at sink, Sitting at sink Toileting: Maximal assistance Where Assessed-Toileting: Bedside Commode Toilet Transfer: Moderate assistance Toilet Transfer Method: Stand pivot Science writer: Radiographer, therapeutic: Not assessed Vision Baseline Vision/History: Wears glasses Wears Glasses: Reading only Patient Visual Report: No change from baseline Vision Assessment?: Vision impaired- to be further tested in functional context;Yes Eye Alignment: Impaired  (comment) Tracking/Visual Pursuits: Impaired - to be further tested in functional context Saccades: Impaired - to be further tested in functional context Additional Comments: need to further assess Perception  Perception: Impaired Inattention/Neglect: Does not attend to right side of body Praxis Praxis:  Impaired Praxis Impairment Details: Ideation;Initiation Cognition Overall Cognitive Status: Impaired/Different from baseline Arousal/Alertness: Awake/alert Orientation Level: Person;Place;Situation Person: Oriented Place: Oriented Situation: Oriented Year: 2021 Month: June Day of Week: Correct Memory: Impaired Memory Impairment: Storage deficit;Retrieval deficit;Decreased short term memory Decreased Short Term Memory: Verbal basic Immediate Memory Recall: Sock Memory Recall Sock: With Cue Memory Recall Blue: With Cue Memory Recall Bed: Not able to recall Attention: Alternating;Divided;Sustained;Focused Focused Attention: Impaired Focused Attention Impairment: Verbal basic;Functional basic Sustained Attention: Impaired Sustained Attention Impairment: Verbal basic;Functional basic Divided Attention: Impaired Divided Attention Impairment: Verbal basic;Functional basic Awareness: Impaired Awareness Impairment: Intellectual impairment;Anticipatory impairment;Emergent impairment Problem Solving: Impaired Problem Solving Impairment: Verbal basic;Functional basic Executive Function: Decision Making;Initiating Decision Making: Impaired Decision Making Impairment: Verbal basic;Functional basic Initiating: Impaired Initiating Impairment: Verbal basic;Functional basic Safety/Judgment: Impaired Comments: Pt with decreased awareness of physical and cognitive impairments as well as decreased initiation and comprehension, which present safety concern Sensation Sensation Light Touch: Appears Intact (BUE) Hot/Cold: Appears Intact Proprioception: Impaired by gross assessment Additional  Comments: Pt reports it feels like his right arm "isnt there" Coordination Gross Motor Movements are Fluid and Coordinated: No Fine Motor Movements are Fluid and Coordinated: No Coordination and Movement Description: limited by hemiplegia and weakness R Motor  Motor Motor: Hemiplegia;Motor apraxia Motor - Skilled Clinical Observations: R hemiplegia, poor initiation and possibly some mild apraxia Mobility  Bed Mobility Bed Mobility: Rolling Right;Supine to Sit;Sit to Supine Rolling Right: Moderate Assistance - Patient 50-74% Rolling Left: Moderate Assistance - Patient 50-74% Supine to Sit: Moderate Assistance - Patient 50-74% Sit to Supine: Minimal Assistance - Patient > 75% Transfers Sit to Stand: Moderate Assistance - Patient 50-74% Stand to Sit: Minimal Assistance - Patient > 75%  Trunk/Postural Assessment  Cervical Assessment Cervical Assessment: Within Functional Limits Thoracic Assessment Thoracic Assessment: Within Functional Limits Lumbar Assessment Lumbar Assessment: Within Functional Limits Postural Control Postural Control: Deficits on evaluation  Balance Balance Balance Assessed: Yes Static Sitting Balance Static Sitting - Balance Support: No upper extremity supported Static Sitting - Level of Assistance: 5: Stand by assistance Dynamic Sitting Balance Dynamic Sitting - Balance Support: Bilateral upper extremity supported Dynamic Sitting - Level of Assistance: 4: Min assist Sitting balance - Comments: tends to lean forward in chair Static Standing Balance Static Standing - Balance Support: Bilateral upper extremity supported Static Standing - Level of Assistance: 5: Stand by assistance Dynamic Standing Balance Dynamic Standing - Balance Support: Bilateral upper extremity supported Dynamic Standing - Level of Assistance: 2: Max assist Extremity/Trunk Assessment RUE Assessment General Strength Comments: RUE: shoulder 0/5; biceps 3/5, triceps 1/5 , forearm  pronation and supination: 3/5 , wrist flex/ext 3/5, gross hand flex and ext 3+/5 RUE Body System: Neuro Brunstrum levels for arm and hand: Arm;Hand Brunstrum level for arm: Stage II Synergy is developing Brunstrum level for hand: Stage IV Movements deviating from synergies RUE AROM (degrees) Right Shoulder Flexion: 0 Degrees Right Shoulder ABduction: 0 Degrees RUE PROM (degrees) Overall PROM Right Upper Extremity: Within functional limits for tasks performed LUE Assessment LUE Assessment: Within Functional Limits     Refer to Care Plan for Long Term Goals  Recommendations for other services: None    Discharge Criteria: Patient will be discharged from OT if patient refuses treatment 3 consecutive times without medical reason, if treatment goals not met, if there is a change in medical status, if patient makes no progress towards goals or if patient is discharged from hospital.  The above assessment, treatment plan, treatment alternatives and goals were discussed  and mutually agreed upon: by patient  Ezekiel Slocumb 03/07/2020, 4:05 PM

## 2020-03-07 NOTE — Telephone Encounter (Signed)
Called and left message for patient's wife. Patient is currently hospitalized.

## 2020-03-07 NOTE — Evaluation (Signed)
Speech Language Pathology Assessment and Plan  Patient Details  Name: Kevin [name] MRN: [id number] Date of Birth: 12-02-1950  SLP Diagnosis: Cognitive Impairments;Speech and Language deficits  Rehab Potential: Good ELOS: 3-4 weeks    Today's Date: 03/07/2020 SLP Individual Time: 3329-5188 SLP Individual Time Calculation (min): 55 min   Problem List:  Patient Active Problem List   Diagnosis Date Noted  . Slow transit constipation   . Chronic systolic congestive heart failure ([place])   . Benign essential HTN   . Embolic stroke ([place]) 41/66/0630  . Cerebrovascular accident (CVA) due to embolism of cerebral artery ([place]) 03/03/2020  . Acute focal neurological deficit 03/02/2020  . Liver cirrhosis ([place])   . Metabolic encephalopathy   . Chronic systolic CHF (congestive heart failure) ([place])   . Paroxysmal atrial fibrillation (HCC)   . Essential hypertension   . HCV antibody positive   . Secondary hypertension, unspecified   . Elevated troponin   . Blood pressure instability   . CAD in native artery cath 2016 90% mRamus  02/01/2016  . Atrial fibrillation, permanent ([place]) 02/01/2016  . NSTEMI (non-ST elevated myocardial infarction) ([place])   . Status epilepticus ([place]) 01/31/2016  . High anion gap metabolic acidosis   . Lactic acidosis   . Cerebral infarction due to unspecified mechanism   . Hypertensive emergency   . Seizures ([place])   . CVA (cerebral infarction)   . TIA (transient ischemic attack) 08/23/2015  . CKD (chronic kidney disease) stage 3, GFR 30-59 ml/min 08/23/2015  . Acute ischemic stroke ([place]) 08/23/2015  . Abnormal nuclear stress test 09/27/2014  . H/O medication noncompliance 09/27/2014  . Unstable angina ([place]) 09/27/2014  . Persistent atrial fibrillation ([place]) 08/23/2014  . Chronic diastolic heart failure ([place]) 08/23/2014  . Substance abuse ([place]) 08/23/2014  . Erectile dysfunction 08/23/2014  . Cirrhosis of liver ([place]) 04/23/2014  . DOE (dyspnea on exertion)  04/19/2014  . Atrial fibrillation with RVR ([place]) 04/19/2014  . Dysphagia 04/19/2014  . Obesity 05/02/2012  . NSVT (nonsustained ventricular tachycardia) ([place]) 05/02/2012  . Thrombocytopenia ([place]) 05/02/2012  . Cholelithiases 05/01/2012  . Chest pain, no significant macrovascular CAD at cath 05/01/12; Likely due to elevated LVEDP & Afib with Diastolic HF. 16/09/930  . Atrial fibrillation with RVR, converted to SR with TEE and DCCV 05/05/12 04/30/2012  . PAD (peripheral artery disease), PTA to Lt. SFA in 2009, total occlusion mid Rt SFA with collaterals  04/30/2012  . Cocaine abuse ([place]) 04/30/2012  . HTN (hypertension) -Accelerated with End organ involvement 04/30/2012  . Tobacco abuse 04/30/2012  . ETOH abuse 04/30/2012  . Hepatitis C antibody test positive 04/23/2012   Past Medical History:  Past Medical History:  Diagnosis Date  . Atrial fibrillation, permanent ([place]) 02/01/2016  . Blood transfusion without reported diagnosis   . CAD in native artery cath 2016 90% mRamus  02/01/2016  . CHF (congestive heart failure) ([place])   . Cholelithiases 05/01/2012  . Claudication ([place])    bilateral  . Cocaine abuse ([place])   . Dysrhythmia    afib  . ETOH abuse   . Hepatitis C antibody test positive 04/2012  . Hyperpigmentation   . Hypertension   . PAD (peripheral artery disease) ([place])   . Persistent atrial fibrillation ([place]) 08/23/2014  . Thrombocytopenia ([place])   . Tobacco abuse    Past Surgical History:  Past Surgical History:  Procedure Laterality Date  . Cardiolite     negative for ischemia  . CARDIOVASCULAR STRESS TEST    .  CARDIOVERSION  05/05/2012   Procedure: CARDIOVERSION;  Surgeon: Sanda Klein, MD;  Location: [facility] ENDOSCOPY;  Service: Cardiovascular;  Laterality: N/A;  . CAROTID ARTERY ANGIOPLASTY    . DOPPLER ECHOCARDIOGRAPHY    . ENDOVASCULAR STENT INSERTION  right leg  . LEFT HEART CATHETERIZATION WITH CORONARY ANGIOGRAM N/A 05/01/2012   Procedure: LEFT HEART CATHETERIZATION  WITH CORONARY ANGIOGRAM;  Surgeon: Kevin Man, MD;  Location[facility] CATH LAB;  Service: Cardiovascular;  Laterality: N/A;  . LEFT HEART CATHETERIZATION WITH CORONARY ANGIOGRAM N/A 09/27/2014   Procedure: LEFT HEART CATHETERIZATION WITH CORONARY ANGIOGRAM;  Surgeon: Pixie Casino, MD;  Location: [facility] CATH LAB;  Service: Cardiovascular;  Laterality: N/A;  . TEE WITHOUT CARDIOVERSION  05/05/2012   Procedure: TRANSESOPHAGEAL ECHOCARDIOGRAM (TEE);  Surgeon: Sanda Klein, MD;  Location: [facility] ENDOSCOPY;  Service: Cardiovascular;  Laterality: N/A;    Assessment / Plan / Recommendation Clinical Impression   OHF:GBMSXJ Kevin [name] is a 69 year old LH-male with history HTN, CAD w/chronic systolic CHF, CKD- SCr 1.9?, cirrhosis of the liver with chronic thrombocytopenia, polysubstance abuse, multifactorial SOB, CAF - on Eliquis, prior CVA/TIA, h/o seizures who was admitted on 03/02/20 after fall off the bed with slurred speech and noted to have right sided weakness at admission. CT head negative for bleed. CTA head/CT perfusion negative for LVO or perfusion deficits. He was loaded with Keppra due to concerns of Todd's paralysis. RI brain done revealing numerous acute cortical/subcortical embolic infarcts in Left ACA, MCA, PCA and watershed territories as well as multiple small acute infarcts in left basal ganglia and left thalamus with moderate generalized atrophy.   2D echo showed EF 60-65% with severe concentric LVH and normal LVH. EEG with mild slowing and no seizure activity. UDS positive for cocaine and THC. Dr. Leonie [name] recommended contiuing Eliquis as well as risk factor modification. Patient continues to be limited by right sided weakness with poor safety awareness and decrease in problem solving affecting mobility and ADLs. Pt admitted to [facility] CIR 03/06/20 and SLP evaluations were completed 03/07/20 with results as follows:  Pt presents with severe cognitive impairments impacting his short term memory,  sustained attention, basic problem solving, initiation, and auditory comprehension. Pt also with poor frustration tolerance, further impacted by reduced attention which limited the evaluation and overall participation ,however oriented X4. Pt frequently required repetition of verbal instructions and/or verbal cueing to execute 1-step directions with 70% accuracy. Pt unable to follow any 2-step directions accurately. He demonstrated very poor intellectual awareness, requiring Max A cueing in order to identify any physical impairments. He did state awareness "my mind just isn't working right" and became intermittently visibly frustrated when he could not complete tasks asked of him. Pt's confrontation naming and expression of basic wants and needs was [facility] throughout assessment, however is was difficult to discriminate between expressive language VS cognitive impairments at this time and ST will continue diagnostic treatment of language abilities. Pt did verbalize knowledge of purpose of call bell and how to use it to call for assistance, however Total A required to identify situations in which he would need to call for assistance. Suspect pt will have difficulty requesting assistance at this time due to cognition and decreased initiation - spoke with RN regarding this and communication issues, as well as potential for timed rounding to increase safety.   Recommend pt receive skilled ST services to address cognitive-linguistic impairments described above in order to maximize his safety and functional independence prior to discharge.    Skilled Therapeutic  Interventions          Cognitive-linguistic evaluation was administered and results were reviewed with pt (please see above for details regarding results).   SLP Assessment  Patient will need skilled [place] Pathology Services during CIR admission    Recommendations  Oral Care Recommendations: Oral care BID Patient destination: Home Follow up  Recommendations: Home Health SLP;24 hour supervision/assistance Equipment Recommended: None recommended by SLP    SLP Frequency 3 to 5 out of 7 days   SLP Duration  SLP Intensity  SLP Treatment/Interventions 3-4 weeks  Minumum of 1-2 x/day, 30 to 90 minutes  Cognitive remediation/compensation;Cueing hierarchy;Functional tasks;Patient/family education;Therapeutic Activities;Internal/external aids;Multimodal communication approach;Speech/Language facilitation    Pain Pain Assessment Pain Scale: 0-10 Pain Score: 0-No pain     SLP Evaluation Cognition Overall Cognitive Status: Impaired/Different from baseline Arousal/Alertness: Lethargic Orientation Level: Oriented X4 Attention: Sustained Sustained Attention: Impaired Sustained Attention Impairment: Verbal basic;Functional basic Memory: Impaired Memory Impairment: Storage deficit;Retrieval deficit;Decreased short term memory Decreased Short Term Memory: Verbal basic;Functional basic Awareness: Impaired Awareness Impairment: Intellectual impairment Problem Solving: Impaired Problem Solving Impairment: Verbal basic;Functional basic Executive Function:  (all impaired due to lower level deficits) Safety/Judgment: Impaired Comments: Pt with decreased awareness of physical and cognitive impairments as well as decreased initiation and comprehension, which present safety concern  Comprehension Auditory Comprehension Overall Auditory Comprehension: Impaired Yes/No Questions: Within Functional Limits Commands: Impaired One Step Basic Commands: 50-74% accurate Two Step Basic Commands: 0-24% accurate Conversation: Simple Interfering Components: Attention;Working memory EffectiveTechniques: Data processing manager: Not tested Reading Comprehension Reading Status: Not tested Expression Expression Primary Mode of Expression: Verbal Verbal  Expression Overall Verbal Expression: Appears within functional limits for tasks assessed (will continue diagnostic treatment) Naming: No impairment Non-Verbal Means of Communication: Not applicable Written Expression Written Expression: Not tested Oral Motor Oral Motor/Sensory Function Overall Oral Motor/Sensory Function: Mild impairment Facial ROM: Within Functional Limits Facial Symmetry: Within Functional Limits Facial Strength: Reduced right Lingual Symmetry: Within Functional Limits Velum: Within Functional Limits Mandible: Within Functional Limits Motor Speech Overall Motor Speech: Appears within functional limits for tasks assessed Phonation: Normal Intelligibility: Intelligible Motor Speech Errors: Not applicable  Intelligibility: Intelligible  Short Term Goals: Week 1: SLP Short Term Goal 1 (Week 1): Pt will sustain attention to functional tasks for 5 minute intervals with Max A cues for redirection. SLP Short Term Goal 2 (Week 1): Pt will demonsrate ability to problem solve in basic familiar functional situations with Max A verbal/visual cues. SLP Short Term Goal 3 (Week 1): Pt will identify 1 physical and 1 cognitive impairment with Max A verbal/visual cues. SLP Short Term Goal 4 (Week 1): Pt will recall new and/or daily information with Max A verbal/visual cues for use of aids or other compensatory memory strategies. SLP Short Term Goal 5 (Week 1): Pt will follow 2-step directions with 50% accuracy provided Max A verbal and visual cues.  Refer to Care Plan for Long Term Goals  Recommendations for other services: None   Discharge Criteria: Patient will be discharged from SLP if patient refuses treatment 3 consecutive times without medical reason, if treatment goals not met, if there is a change in medical status, if patient makes no progress towards goals or if patient is discharged from hospital.  The above assessment, treatment plan, treatment alternatives and goals  were discussed and mutually agreed upon: by patient  Arbutus Leas 03/07/2020, 11:52 AM

## 2020-03-07 NOTE — Progress Notes (Signed)
[facility] PHYSICAL MEDICINE & REHABILITATION PROGRESS NOTE  Subjective/Complaints: Patient seen sitting up in bed this AM.   He states he slept well overnight.  He is ready to being therapies.  He notes constipation.   ROS: +Constipation, DOE. Denies CP, N/V/D  Objective: Vital Signs: Blood pressure (!) 154/90, pulse 71, temperature 97.9 F (36.6 C), temperature source Oral, resp. rate 14, height 6' (1.829 m), weight 101.9 kg, SpO2 92 %. No results found. Recent Labs    03/06/20 0450 03/07/20 0705  WBC 7.9 6.7  HGB 14.7 15.5  HCT 44.3 45.3  PLT 76* 82*   Recent Labs    03/06/20 0450 03/07/20 0705  NA 139 140  K 4.3 3.8  CL 110 109  CO2 19* 22  GLUCOSE 112* 126*  BUN 16 15  CREATININE 1.37* 1.40*  CALCIUM 8.4* 8.8*    Physical Exam: BP (!) 154/90 (BP Location: Left Arm)   Pulse 71   Temp 97.9 F (36.6 C) (Oral)   Resp 14   Ht 6' (1.829 m)   Wt 101.9 kg   SpO2 92%   BMI 30.47 kg/m  Constitutional: No distress . Vital signs reviewed. HENT: Normocephalic.  Atraumatic. Eyes: EOMI. No discharge. Cardiovascular: No JVD. Respiratory: Normal effort.  No stridor. GI: Distended. Skin: Healed knee incisions Psych: Normal mood.  Normal behavior. Musc: No edema in extremities.  No tenderness in extremities. Neuro: Alert Right facial weakness Dysarthria Motor: LUE/LLE: 5/5 proximal to distal RUE: Shoulder abduction 3-/5, distally 3/5 with apraxia RLE: 3+/5 proximal to distal  Assessment/Plan: 1. Functional deficits secondary to left MCA/ACA infarcts which require 3+ hours per day of interdisciplinary therapy in a comprehensive inpatient rehab setting.  Physiatrist is providing close team supervision and 24 hour management of active medical problems listed below.  Physiatrist and rehab team continue to assess barriers to discharge/monitor patient progress toward functional and medical goals  Care Tool:  Bathing              Bathing assist       Upper  Body Dressing/Undressing Upper body dressing   What is the patient wearing?: Hospital gown only    Upper body assist      Lower Body Dressing/Undressing Lower body dressing      What is the patient wearing?: Incontinence brief     Lower body assist       Toileting Toileting    Toileting assist       Transfers Chair/bed transfer  Transfers assist           Locomotion Ambulation   Ambulation assist              Walk 10 feet activity   Assist           Walk 50 feet activity   Assist           Walk 150 feet activity   Assist           Walk 10 feet on uneven surface  activity   Assist           Wheelchair     Assist               Wheelchair 50 feet with 2 turns activity    Assist            Wheelchair 150 feet activity     Assist            Medical Problem List and Plan: 1.  Right sided weakness with poor safety awareness and decrease in problem solving affecting mobility and ADLs secondary to Left ACA/MCA watershed infarct, small left basal ganglia and thalamic infarct, left parietal occipital infarct on 03/02/20.  Begin CIR evaluations 2. Antithrombotics: -DVT/anticoagulation:Pharmaceutical:Other (comment)--Eliquis. -antiplatelet therapy: N/A 3. Pain Management:N/A 4. Mood:LCSW to follow for evaluation and support. -antipsychotic agents: N/a 5. Neuropsych: This patientisnot fully capable of making decisions on hisown behalf. 6. Skin/Wound Care:Routine pressure relief measures. 7. Fluids/Electrolytes/Nutrition:Monitor I/Os 8. HTN: Monitor BP   Lasix/Entresto/aldactone resumed 06/14.   Monitor with increased mobility 9. Chronic systolic CHF: Monitor Monitor for signs of overload. Continue Coreg bid, Lasix, Entresto,aldactone and Lipitor.   No ASA due to low platelets.    Filed Weights   03/06/20 1754 03/07/20 0537  Weight: 102.8 kg 101.9 kg   10.  Cirrhosis of the liver with chronic thrombocytopenia: Monitor for signs of bleeding or decompensation.   LFTs WNL on 6/15  Plts 82 on 6/15 11. Chronic SOB: Multifactorial--? OSA. On Trilegy for reactive airway disease. Duo nebs prn. Encourage IS.   Monitor with increased exertion 12. Polysubstance abuse + Cocaine and THC on admit : Has been ongoing since 2015. Educate/encourage patient regarding life style changes.  13. Seizure disorder: Continue Keppra bid.  14. Impaired fasting glucose: Hgb A1c- 5.2.   Monitor with increased mobility 15. CKD:   Cr. 1.40 on 6/15  Cont to monitor 16. Constipation  Bowel meds increased on 6/15  LOS: 1 days A FACE TO FACE EVALUATION WAS PERFORMED  Jaydee Ingman Lorie Phenix 03/07/2020, 10:02 AM

## 2020-03-07 NOTE — Telephone Encounter (Signed)
Call patient  Sleep study result  Date of study: 02/24/2020  Impression: Severe obstructive sleep apnea with moderate oxygen desaturations  Recommendation: Recommend CPAP therapy for severe obstructive sleep apnea Auto titrating CPAP with pressure settings of 5-20 will be appropriate

## 2020-03-07 NOTE — Progress Notes (Addendum)
Inpatient Rehabilitation Care Coordinator Assessment and Plan  Patient Details  Name: JERSON FURUKAWA MRN: [id number] Date of Birth: April 11, 1951  Today's Date: 03/07/2020  Problem List:  Patient Active Problem List   Diagnosis Date Noted  . Slow transit constipation   . Chronic systolic congestive heart failure ([place])   . Benign essential HTN   . Embolic stroke ([place]) 23/76/2831  . Cerebrovascular accident (CVA) due to embolism of cerebral artery ([place]) 03/03/2020  . Acute focal neurological deficit 03/02/2020  . Liver cirrhosis ([place])   . Metabolic encephalopathy   . Chronic systolic CHF (congestive heart failure) ([place])   . Paroxysmal atrial fibrillation (HCC)   . Essential hypertension   . HCV antibody positive   . Secondary hypertension, unspecified   . Elevated troponin   . Blood pressure instability   . CAD in native artery cath 2016 90% mRamus  02/01/2016  . Atrial fibrillation, permanent ([place]) 02/01/2016  . NSTEMI (non-ST elevated myocardial infarction) ([place])   . Status epilepticus ([place]) 01/31/2016  . High anion gap metabolic acidosis   . Lactic acidosis   . Cerebral infarction due to unspecified mechanism   . Hypertensive emergency   . Seizures ([place])   . CVA (cerebral infarction)   . TIA (transient ischemic attack) 08/23/2015  . CKD (chronic kidney disease) stage 3, GFR 30-59 ml/min 08/23/2015  . Acute ischemic stroke ([place]) 08/23/2015  . Abnormal nuclear stress test 09/27/2014  . H/O medication noncompliance 09/27/2014  . Unstable angina ([place]) 09/27/2014  . Persistent atrial fibrillation ([place]) 08/23/2014  . Chronic diastolic heart failure ([place]) 08/23/2014  . Substance abuse ([place]) 08/23/2014  . Erectile dysfunction 08/23/2014  . Cirrhosis of liver ([place]) 04/23/2014  . DOE (dyspnea on exertion) 04/19/2014  . Atrial fibrillation with RVR ([place]) 04/19/2014  . Dysphagia 04/19/2014  . Obesity 05/02/2012  . NSVT (nonsustained ventricular tachycardia) ([place]) 05/02/2012  .  Thrombocytopenia ([place]) 05/02/2012  . Cholelithiases 05/01/2012  . Chest pain, no significant macrovascular CAD at cath 05/01/12; Likely due to elevated LVEDP & Afib with Diastolic HF. 51/76/1607  . Atrial fibrillation with RVR, converted to SR with TEE and DCCV 05/05/12 04/30/2012  . PAD (peripheral artery disease), PTA to Lt. SFA in 2009, total occlusion mid Rt SFA with collaterals  04/30/2012  . Cocaine abuse ([place]) 04/30/2012  . HTN (hypertension) -Accelerated with End organ involvement 04/30/2012  . Tobacco abuse 04/30/2012  . ETOH abuse 04/30/2012  . Hepatitis C antibody test positive 04/23/2012   Past Medical History:  Past Medical History:  Diagnosis Date  . Atrial fibrillation, permanent ([place]) 02/01/2016  . Blood transfusion without reported diagnosis   . CAD in native artery cath 2016 90% mRamus  02/01/2016  . CHF (congestive heart failure) ([place])   . Cholelithiases 05/01/2012  . Claudication ([place])    bilateral  . Cocaine abuse ([place])   . Dysrhythmia    afib  . ETOH abuse   . Hepatitis C antibody test positive 04/2012  . Hyperpigmentation   . Hypertension   . PAD (peripheral artery disease) ([place])   . Persistent atrial fibrillation ([place]) 08/23/2014  . Thrombocytopenia ([place])   . Tobacco abuse    Past Surgical History:  Past Surgical History:  Procedure Laterality Date  . Cardiolite     negative for ischemia  . CARDIOVASCULAR STRESS TEST    . CARDIOVERSION  05/05/2012   Procedure: CARDIOVERSION;  Surgeon: Sanda Klein, MD;  Location: [facility] ENDOSCOPY;  Service: Cardiovascular;  Laterality: N/A;  . CAROTID ARTERY  ANGIOPLASTY    . DOPPLER ECHOCARDIOGRAPHY    . ENDOVASCULAR STENT INSERTION  right leg  . LEFT HEART CATHETERIZATION WITH CORONARY ANGIOGRAM N/A 05/01/2012   Procedure: LEFT HEART CATHETERIZATION WITH CORONARY ANGIOGRAM;  Surgeon: Marykay Lex, MD;  Location: [facility] CATH LAB;  Service: Cardiovascular;  Laterality: N/A;  . LEFT HEART CATHETERIZATION WITH CORONARY ANGIOGRAM  N/A 09/27/2014   Procedure: LEFT HEART CATHETERIZATION WITH CORONARY ANGIOGRAM;  Surgeon: Chrystie Nose, MD;  Location: [facility] CATH LAB;  Service: Cardiovascular;  Laterality: N/A;  . TEE WITHOUT CARDIOVERSION  05/05/2012   Procedure: TRANSESOPHAGEAL ECHOCARDIOGRAM (TEE);  Surgeon: Thurmon Fair, MD;  Location: [facility] ENDOSCOPY;  Service: Cardiovascular;  Laterality: N/A;   Social History:  reports that he has been smoking cigarettes. He has a 10.00 pack-year smoking history. He has never used smokeless tobacco. He reports current alcohol use. He reports that he does not use drugs.  Family / Support Systems Patient Roles: Spouse Spouse/Significant Other: Warden/ranger Anticipated Caregiver: Spouse- Estelle Ability/Limitations of Caregiver: Plans to return to work in fall Caregiver Availability: 24/7  Social History Preferred language: English Religion: Non-Denominational Read: Yes Write: Yes   Abuse/Neglect Abuse/Neglect Assessment Can Be Completed: Yes Physical Abuse: Denies Verbal Abuse: Denies Sexual Abuse: Denies Exploitation of patient/patient's resources: Denies Self-Neglect: Denies  Emotional Status Pt's affect, behavior and adjustment status: patient just tired and restless Recent Psychosocial Issues: no Psychiatric History: no Substance Abuse History: yes  Patient / Family Perceptions, Expectations & Goals Pt/Family understanding of illness & functional limitations: yes Pt/family expectations/goals: Goal to discharge home with spouse to provide care  [employer]: None Premorbid Home Care/DME Agencies: None Transportation available at discharge: spouse able to transport Resource referrals recommended: Neuropsychology  Discharge Planning Living Arrangements: Spouse/significant other Support Systems: Spouse/significant other Type of Residence: Private residence (1 level, 1 step to enter) Civil engineer, contracting: Media planner (specify)  Multimedia programmer) Financial Screen Referred: No Money Management: Spouse, Patient Care Coordinator Anticipated Follow Up Needs: HH/OP Expected length of stay: 20-22 days  Clinical Impression SW entered room, introduced self. Explained role and process. Patient tired. SW call patient spouse, no answer-left voicemail and provided contact information in room. Sw will continue to follow up with questions and concerns. Patient referred tho neuro psych  Andria Rhein 03/07/2020, 12:13 PM

## 2020-03-07 NOTE — Care Management (Signed)
Patient ID: Kevin [name], male   DOB: 1951-05-19, 69 y.o.   MRN: [id number] Met with the patient to review role of CM for nursing concerns and collaboration with SW Margreta Journey) to facilitate discharge process. Reviewed risk factors for CVA and effects of CVA including smoking, HF, HTN CAD, and A-Fib with initiation of statin. Patient noted he was overwhelmed by situation. He reports he "had no idea". Very drowsy p therapy but demonstrated interest in information. Patient given handouts and notebooks on Blood pressure management and HF management with information on smoking cessation. Continue to follow along for nursing concerns. Margarito Liner

## 2020-03-07 NOTE — Progress Notes (Signed)
Inpatient Rehabilitation  Patient information reviewed and entered into eRehab system by Parris Signer M. Thy Gullikson, M.A., CCC/SLP, PPS Coordinator.  Information including medical coding, functional ability and quality indicators will be reviewed and updated through discharge.    

## 2020-03-08 ENCOUNTER — Inpatient Hospital Stay (HOSPITAL_COMMUNITY): Payer: Medicare HMO | Admitting: Occupational Therapy

## 2020-03-08 ENCOUNTER — Inpatient Hospital Stay (HOSPITAL_COMMUNITY): Payer: Medicare HMO | Admitting: Speech Pathology

## 2020-03-08 ENCOUNTER — Inpatient Hospital Stay (HOSPITAL_COMMUNITY): Payer: Medicare HMO | Admitting: Physical Therapy

## 2020-03-08 DIAGNOSIS — N183 Chronic kidney disease, stage 3 unspecified: Secondary | ICD-10-CM

## 2020-03-08 MED ORDER — SENNA 8.6 MG PO TABS
2.0000 | ORAL_TABLET | Freq: Two times a day (BID) | ORAL | Status: DC
  Administered 2020-03-08 – 2020-03-09 (×4): 17.2 mg via ORAL
  Filled 2020-03-08 (×5): qty 2

## 2020-03-08 MED ORDER — SPIRONOLACTONE 12.5 MG HALF TABLET
12.5000 mg | ORAL_TABLET | Freq: Every day | ORAL | Status: DC
  Administered 2020-03-08 – 2020-03-28 (×21): 12.5 mg via ORAL
  Filled 2020-03-08 (×21): qty 1

## 2020-03-08 NOTE — Progress Notes (Signed)
Physical Therapy Session Note  Patient Details  Name: Kevin [name] MRN: [id number] Date of Birth: 03/01/1951  Today's Date: 03/08/2020 PT Individual Time: 1447-1530 PT Individual Time Calculation (min): 43 min   Short Term Goals: Week 1:  PT Short Term Goal 1 (Week 1): Patient to be able to perform bed to chair transfers with ModA PT Short Term Goal 2 (Week 1): Patient to be able to propel WC 20ft with ModA PT Short Term Goal 3 (Week 1): Patient to be able to maintain standing for 3-4 minutes in stedy without fatigue PT Short Term Goal 4 (Week 1): Patient to be able to gait train 5ft with maxA and WC follow  Skilled Therapeutic Interventions/Progress Updates:    Patient received in bed, fatigued but willing to participate in session; definite with some confusion and processing delay today, often looked at therapist in a questioning way and needed education as to purpose of cues/activities, or needed frequent redirection to stay on task. Able to perform bed mobility with Min-ModA, then worked on multiple sit to stands with bed elevated and MaxA quickly fading to ModA; attempted stand-pivot but R knee began buckling and this therapist felt it unsafe to attempt without second person for safety this afternoon due to fatigue. Returned to supine and worked on exercises including SLRs, supine hip abduction, and ankle pumps. SpO2 no lower than 93% on room air but very short of breath. Left in bed with all needs met, bed alarm active this afternoon.   Therapy Documentation Precautions:  Precautions Precautions: Fall Precaution Comments: fatigues VERY easily, need to watch O2 sats, R hemiplegia and neglect, impaired cognition Restrictions Weight Bearing Restrictions: No Pain: Pain Assessment Pain Scale: 0-10 Pain Score: 0-No pain    Therapy/Group: Individual Therapy   Kristen U PT, DPT, PN1   Supplemental Physical Therapist [place]    Pager [phone] Acute Rehab Office  [phone]    03/08/2020, 3:49 PM  

## 2020-03-08 NOTE — Progress Notes (Signed)
Patient ID: RAMZI BRATHWAITE, male   DOB: 11-06-1950, 69 y.o.   MRN: [id number] Team Conference Report to Patient/Family  Team Conference discussion was reviewed with the patient and caregiver, including goals, any changes in plan of care and target discharge date.  Patient and caregiver express understanding and are in agreement.  The patient has a target discharge date of 03/28/20.  Andria Rhein 03/08/2020, 2:07 PM

## 2020-03-08 NOTE — Telephone Encounter (Signed)
Called and spoke with wife regarding home sleep study results, orders placed to start CPAP therapy when he is released from the hospital. Follow up recall placed for 3 months.

## 2020-03-08 NOTE — Progress Notes (Signed)
Occupational Therapy Session Note  Patient Details  Name: Kevin [name] MRN: [id number] Date of Birth: [birth year]  Today's Date: 03/08/2020 OT Individual Time: 0800-0903 OT Individual Time Calculation (min): 63 min    Short Term Goals: Week 1:  OT Short Term Goal 1 (Week 1): Pt will donn/doff shirt with min assist OT Short Term Goal 2 (Week 1): Pt will bathe LB with mod assist OT Short Term Goal 3 (Week 1): Pt will complete toilet transfer using BSC with min assist OT Short Term Goal 4 (Week 1): Pt will complete LB dressing with mod assist.  Skilled Therapeutic Interventions/Progress Updates:  Pt in bed sitting up with breakfast in front of him but pt falling asleep.  Pt reports no complaints of pain this morning.  Pt needing max simple VCs to arouse and facilitate self feeding a few bites of cereal using left dominant hand and to drink cup of orange juice.  Pt reports he would like to wash up. Pt completed supine to sit with mod assist.  Pt needing min assist for balance and mod assist for RW mgt including placement of hand on orthosis attached to RW to perform SPT EOB to w/c.  Facilitation of RUE use during sinkside grooming and bathing with pt frequently self initiating use this session.  Pt bathed UB with min assist and cues to lift right arm to reach underneath and hand over hand assist when holding washcloth in right hand to wash under left arm.  Pt bathed LB not including peri/buttocks region with min assist.  Pt brushed teeth next with supervision for verbal cues to iniitate and end task.  Pt needing re-education on compensatory technique to donn shirt threading RUE first to increase independence.  Pt return demonstrated technique and donned shirt with min assist.  Pt exhibited good carryover of technique while donning pants self initiating threading RLE first and pulled over hips in standing at sink with min assist.  Socks and shoes donned with max assist secondary to time constraint.   Pt sitting in w/c with seatbelt alarm, call bell in reach.  Notified SPT of pt's need for supervision during meal times due to drowsiness. Improved AROM noted in right shoulder today during functional use and able to reach to approx 90 degrees abduction.  Improved attending to right side as well with more initiation of functional use of RUE, however intermittent right sided neglect still prevalent.    Therapy Documentation Precautions:  Precautions Precautions: Fall Precaution Comments: fatigues VERY easily, need to watch O2 sats, R hemiplegia and neglect, impaired cognition Restrictions Weight Bearing Restrictions: No   Therapy/Group: Individual Therapy  Amie Critchley 03/08/2020, 3:51 PM

## 2020-03-08 NOTE — Patient Care Conference (Signed)
Inpatient RehabilitationTeam Conference and Plan of Care Update Date: 03/08/2020   Time: 1:59 PM    Patient Name: Kevin [name]      Medical Record Number: [id number]  Date of Birth: November 06, 1950 Sex: Male         Room/Bed: 4W21C/4W21C-01 Payor Info: Payor: [employer] MEDICARE / Plan: [employer] MEDICARE HMO / Product Type: *No Product type* /    Admit Date/Time:  03/06/2020  5:51 PM  Primary Diagnosis:  Cerebrovascular accident (CVA) due to embolism of cerebral artery [facility])  Patient Active Problem List   Diagnosis Date Noted  . Slow transit constipation   . Chronic systolic congestive heart failure (HCC)   . Benign essential HTN   . Embolic stroke (HCC) 03/06/2020  . Cerebrovascular accident (CVA) due to embolism of cerebral artery (HCC) 03/03/2020  . Acute focal neurological deficit 03/02/2020  . Liver cirrhosis (HCC)   . Metabolic encephalopathy   . Chronic systolic CHF (congestive heart failure) (HCC)   . Paroxysmal atrial fibrillation (HCC)   . Essential hypertension   . HCV antibody positive   . Secondary hypertension, unspecified   . Elevated troponin   . Blood pressure instability   . CAD in native artery cath 2016 90% mRamus  02/01/2016  . Atrial fibrillation, permanent (HCC) 02/01/2016  . NSTEMI (non-ST elevated myocardial infarction) (HCC)   . Status epilepticus (HCC) 01/31/2016  . High anion gap metabolic acidosis   . Lactic acidosis   . Cerebral infarction due to unspecified mechanism   . Hypertensive emergency   . Seizures (HCC)   . CVA (cerebral infarction)   . TIA (transient ischemic attack) 08/23/2015  . CKD (chronic kidney disease) stage 3, GFR 30-59 ml/min 08/23/2015  . Acute ischemic stroke (HCC) 08/23/2015  . Abnormal nuclear stress test 09/27/2014  . H/O medication noncompliance 09/27/2014  . Unstable angina (HCC) 09/27/2014  . Persistent atrial fibrillation (HCC) 08/23/2014  . Chronic diastolic heart failure (HCC) 08/23/2014  . Substance abuse (HCC)  08/23/2014  . Erectile dysfunction 08/23/2014  . Cirrhosis of liver (HCC) 04/23/2014  . DOE (dyspnea on exertion) 04/19/2014  . Atrial fibrillation with RVR (HCC) 04/19/2014  . Dysphagia 04/19/2014  . Obesity 05/02/2012  . NSVT (nonsustained ventricular tachycardia) (HCC) 05/02/2012  . Thrombocytopenia (HCC) 05/02/2012  . Cholelithiases 05/01/2012  . Chest pain, no significant macrovascular CAD at cath 05/01/12; Likely due to elevated LVEDP & Afib with Diastolic HF. 04/30/2012  . Atrial fibrillation with RVR, converted to SR with TEE and DCCV 05/05/12 04/30/2012  . PAD (peripheral artery disease), PTA to Lt. SFA in 2009, total occlusion mid Rt SFA with collaterals  04/30/2012  . Cocaine abuse (HCC) 04/30/2012  . HTN (hypertension) -Accelerated with End organ involvement 04/30/2012  . Tobacco abuse 04/30/2012  . ETOH abuse 04/30/2012  . Hepatitis C antibody test positive 04/23/2012    Expected Discharge Date: Expected Discharge Date: 03/28/20  Team Members Present: Physician leading conference: Dr. Maryla Morrow Care Coodinator Present: Cheyenne Adas, RN, BSN, CRRN;Christina Vita Barley, BSW Nurse Present: Willey Blade, RN PT Present: Nedra Hai, PT OT Present: Primitivo Gauze, OT SLP Present: Suzzette Righter, CF-SLP PPS Coordinator present : Edson Snowball, PT     Current Status/Progress Goal Weekly Team Focus  Bowel/Bladder   incontinent of bowel/bladder.  To become more continent.  Assess toileting needs and answer call light promptly.   Swallow/Nutrition/ Hydration             ADL's   min to mod assist for functional transfers;  mod to max assist ADLs  min assist  energy conservation, pursed lip breathing, ADL training, standing balance, functional transfers, right sided attention   Mobility   Min-ModA bed mobility, ModA standing in stedy, MaxA squat pivot to left; very easily fatigued. Able to walk 41ft in parallel bars but very unsafe. Very confused. Can desat to 88% on room air  but recovers with PLB.  Min-ModA  all aspects of functional mobilty, standing and gait as able, balance, activity tolerance   Communication   Comprehension deficits, suspect due to processing and attention as opposed to language issue but will continue dx tx of receptive and expressive language  Supervision A  continue dx tx of expressive and receptive lanugage skills to differentiate between language vs cognitive deficits   Safety/Cognition/ Behavioral Observations  Max A  Min A  initiation, functional basic problem solving and safety awareness, recall, sustained attention, intellectual awareness   Pain   no complaints of pain.  To remain pain free.  Assess pain q shift or prn.   Skin   skin is intact.  To prevent skin breakdown from occurring.  Assess skin q shift or prn.    Rehab Goals Patient on target to meet rehab goals: No Rehab Goals Revised: patient currently being evaluated *See Care Plan and progress notes for long and short-term goals.     Barriers to Discharge  Current Status/Progress Possible Resolutions Date Resolved   Nursing                  PT  Behavior  cognition, fatigue              OT                  SLP                Care Coordinator Medical stability;Lack of/limited family support spouse returning to work in fall            Discharge Planning/Teaching Needs:  Patient plans to discharge home witth spouse  Will schedule with spouse if recommended   Team Discussion:  Medical issues monitored. Lethargy, fatigue, poor endurance impairing progress. Also noted hearing loss, decreased comprehension and sustained attention impairing progress with problem solving.  Able to communicate basic needs however requires cues for abstract thoughts. Minimal assist goals set for discharge.  Revisions to Treatment Plan:      Medical Summary Current Status: Right sided weakness with poor safety awareness and decrease in problem solving affecting mobility and ADLs   secondary to Left ACA/MCA watershed infarct, small left basal ganglia and thalamic infarct, left parietal occipital infarct on 03/02/20 Weekly Focus/Goal: Improve mobility, constipation, CKD, CHF, platelets, BP  Barriers to Discharge: Behavior;Medical stability;Incontinence   Possible Resolutions to Barriers: Therapies, follow labs, optimize bowel meds, follow weights, optimize BP meds   Continued Need for Acute Rehabilitation Level of Care: The patient requires daily medical management by a physician with specialized training in physical medicine and rehabilitation for the following reasons: Direction of a multidisciplinary physical rehabilitation program to maximize functional independence : Yes Medical management of patient stability for increased activity during participation in an intensive rehabilitation regime.: Yes Analysis of laboratory values and/or radiology reports with any subsequent need for medication adjustment and/or medical intervention. : Yes   I attest that I was present, lead the team conference, and concur with the assessment and plan of the team.   Dorien Chihuahua B 03/08/2020, 1:59 PM

## 2020-03-08 NOTE — Progress Notes (Signed)
Ok to resume spironolactone per Dr Allena Katz.  Ulyses Southward, PharmD, BCIDP, AAHIVP, CPP Infectious Disease Pharmacist 03/08/2020 3:29 PM

## 2020-03-08 NOTE — Telephone Encounter (Signed)
Spoke to RN Andris Flurry at [facility], patient is on [address] for rehab for the next 20-25 days. Reported results of home sleep study and Dr. Trena Platt recommendations for severe sleep apnea.  (CPAP auto titrate 5-20 cm H2O)  RN verbalized understanding of orders and will pass it on to the physician.

## 2020-03-08 NOTE — Progress Notes (Signed)
[facility] PHYSICAL MEDICINE & REHABILITATION PROGRESS NOTE  Subjective/Complaints: Patient seen sitting up in his chair working with therapy this morning.  He states he slept well overnight.  Discussed with therapies improvement in right inattention and strength.  He states he had a good first day of therapies yesterday.  ROS: Denies CP, shortness of breath, N/V/D  Objective: Vital Signs: Blood pressure (!) 144/88, pulse 84, temperature 99 F (37.2 C), temperature source Oral, resp. rate 20, height 6' (1.829 m), weight 100.4 kg, SpO2 98 %. No results found. Recent Labs    03/06/20 0450 03/07/20 0705  WBC 7.9 6.7  HGB 14.7 15.5  HCT 44.3 45.3  PLT 76* 82*   Recent Labs    03/06/20 0450 03/07/20 0705  NA 139 140  K 4.3 3.8  CL 110 109  CO2 19* 22  GLUCOSE 112* 126*  BUN 16 15  CREATININE 1.37* 1.40*  CALCIUM 8.4* 8.8*    Physical Exam: BP (!) 144/88 (BP Location: Left Arm)   Pulse 84   Temp 99 F (37.2 C) (Oral)   Resp 20   Ht 6' (1.829 m)   Wt 100.4 kg   SpO2 98%   BMI 30.02 kg/m  Constitutional: No distress . Vital signs reviewed. HENT: Normocephalic.  Atraumatic. Eyes: EOMI. No discharge. Cardiovascular: No JVD.  RRR. Respiratory: Normal effort. No stridor.  Bilaterally clear to auscultation. GI: Distended. Skin: Healed incisions. Psych: Slowed. Musc: No edema in extremities.  No tenderness in extremities. Neuro: Alert Right facial weakness Dysarthria, unchanged Motor: LUE/LLE: 5/5 proximal to distal RUE: Shoulder abduction 3/5, distally 3/5 with apraxia RLE: 3+/5 proximal to distal  Assessment/Plan: 1. Functional deficits secondary to left MCA/ACA infarcts which require 3+ hours per day of interdisciplinary therapy in a comprehensive inpatient rehab setting.  Physiatrist is providing close team supervision and 24 hour management of active medical problems listed below.  Physiatrist and rehab team continue to assess barriers to discharge/monitor  patient progress toward functional and medical goals  Care Tool:  Bathing    Body parts bathed by patient: Chest, Abdomen, Right upper leg, Left upper leg, Face   Body parts bathed by helper: Left arm, Right lower leg, Left lower leg, Right arm Body parts n/a: Buttocks, Front perineal area   Bathing assist Assist Level: Maximal Assistance - Patient 24 - 49%     Upper Body Dressing/Undressing Upper body dressing Upper body dressing/undressing activity did not occur (including orthotics): N/A What is the patient wearing?: Pull over shirt    Upper body assist Assist Level: Maximal Assistance - Patient 25 - 49%    Lower Body Dressing/Undressing Lower body dressing    Lower body dressing activity did not occur: N/A What is the patient wearing?: Pants     Lower body assist Assist for lower body dressing: Maximal Assistance - Patient 25 - 49%     Toileting Toileting Toileting Activity did not occur (Clothing management and hygiene only): N/A (no void or bm)  Toileting assist       Transfers Chair/bed transfer  Transfers assist  Chair/bed transfer activity did not occur: N/A  Chair/bed transfer assist level: Maximal Assistance - Patient 25 - 49% (squat pivot)     Locomotion Ambulation   Ambulation assist      Assist level: 2 helpers Assistive device: Parallel bars Max distance: 2   Walk 10 feet activity   Assist  Walk 10 feet activity did not occur: Safety/medical concerns (fatigue)  Walk 50 feet activity   Assist Walk 50 feet with 2 turns activity did not occur: Safety/medical concerns (fatigue)         Walk 150 feet activity   Assist Walk 150 feet activity did not occur: Safety/medical concerns (fatigue)         Walk 10 feet on uneven surface  activity   Assist Walk 10 feet on uneven surfaces activity did not occur: Safety/medical concerns (fatigue)         Wheelchair     Assist Will patient use wheelchair at  discharge?: Yes Type of Wheelchair: Manual    Wheelchair assist level: Dependent - Patient 0%      Wheelchair 50 feet with 2 turns activity    Assist        Assist Level: Dependent - Patient 0%   Wheelchair 150 feet activity     Assist     Assist Level: Dependent - Patient 0%      Medical Problem List and Plan: 1. Right sided weakness with poor safety awareness and decrease in problem solving affecting mobility and ADLs secondary to Left ACA/MCA watershed infarct, small left basal ganglia and thalamic infarct, left parietal occipital infarct on 03/02/20.  Continue CIR  Team conference today to discuss current and goals and coordination of care, home and environmental barriers, and discharge planning with nursing, case manager, and therapies.  2. Antithrombotics: -DVT/anticoagulation:Pharmaceutical:Other (comment)--Eliquis. -antiplatelet therapy: N/A 3. Pain Management:N/A 4. Mood:LCSW to follow for evaluation and support. -antipsychotic agents: N/a 5. Neuropsych: This patientisnot fully capable of making decisions on hisown behalf. 6. Skin/Wound Care:Routine pressure relief measures. 7. Fluids/Electrolytes/Nutrition:Monitor I/Os 8. HTN: Monitor BP   Lasix/Entresto/aldactone resumed 06/14.   Relatively controlled on 6/16  Monitor with increased mobility 9. Chronic systolic CHF: Monitor Monitor for signs of overload. Continue Coreg bid, Lasix, Entresto,aldactone and Lipitor.   No ASA due to low platelets.    Filed Weights   03/06/20 1754 03/07/20 0537 03/08/20 0348  Weight: 102.8 kg 101.9 kg 100.4 kg   Improving on 6/16 10. Cirrhosis of the liver with chronic thrombocytopenia: Monitor for signs of bleeding or decompensation.   LFTs WNL on 6/15  Plts 82 on 6/15 11. Chronic SOB: Multifactorial--? OSA. On Trilegy for reactive airway disease. Duo nebs prn. Encourage IS.   Monitor with increased exertion 12. Polysubstance  abuse + Cocaine and THC on admit : Has been ongoing since 2015. Educate/encourage patient regarding life style changes.  13. Seizure disorder: Continue Keppra bid.  14. Impaired fasting glucose: Hgb A1c- 5.2.   Monitor with increased mobility 15. CKD stage III:   Cr.  1.40 on 6/15  Cont to monitor 16. Constipation  Bowel meds increased on 6/15, again on 6/16  LOS: 2 days A FACE TO FACE EVALUATION WAS PERFORMED  Marjorie Lussier Karis Juba 03/08/2020, 10:36 AM

## 2020-03-08 NOTE — Progress Notes (Signed)
Speech Language Pathology Daily Session Note  Patient Details  Name: Kevin [name] MRN: [id number] Date of Birth: [birth year]  Today's Date: 03/08/2020 SLP Individual Time: 4982-6415 SLP Individual Time Calculation (min): 42 min  Short Term Goals: Week 1: SLP Short Term Goal 1 (Week 1): Pt will sustain attention to functional tasks for 5 minute intervals with Max A cues for redirection. SLP Short Term Goal 2 (Week 1): Pt will demonsrate ability to problem solve in basic familiar functional situations with Max A verbal/visual cues. SLP Short Term Goal 3 (Week 1): Pt will identify 1 physical and 1 cognitive impairment with Max A verbal/visual cues. SLP Short Term Goal 4 (Week 1): Pt will recall new and/or daily information with Max A verbal/visual cues for use of aids or other compensatory memory strategies. SLP Short Term Goal 5 (Week 1): Pt will follow 2-step directions with 50% accuracy provided Max A verbal and visual cues.  Skilled Therapeutic Interventions: Pt was seen for skilled ST targeting cognitive-linguistic goals. Pt oriented to self, place, and situation, however not season. Max A visual and verbal cues provided for use of calendar to orient to date. Pt sorted cards by color with 90% accuracy and Min A verbal cues for error awareness, problem solving, and recall wihtin task. He was more alert and engaged in today's session in comparison to yesterday, and language [facility] throuhgout picture description tasks, however some expressive word finding and fluency deficits noted in conversation when attempting to convey more abstract thoughts. Will initiate goal to address this. At end of session, pt returned demonstratio for use of call bell to request assistance as well as TV function. He continues to require Max A verbal cues to identify situations in which he would need to request assistance - when phrased as yes/no question pt acknowledged he should not attempt to get up or walk on his own. Pt  left siting in chair with seatbelt alarm set and needs within reach. Continue per current plan of care.          Pain Pain Assessment Pain Scale: 0-10 Pain Score: 0-No pain  Therapy/Group: Individual Therapy  [name] 03/08/2020, 7:07 AM

## 2020-03-09 ENCOUNTER — Inpatient Hospital Stay (HOSPITAL_COMMUNITY): Payer: Medicare HMO | Admitting: Occupational Therapy

## 2020-03-09 ENCOUNTER — Inpatient Hospital Stay (HOSPITAL_COMMUNITY): Payer: Medicare HMO | Admitting: Physical Therapy

## 2020-03-09 ENCOUNTER — Inpatient Hospital Stay (HOSPITAL_COMMUNITY): Payer: Medicare HMO | Admitting: Speech Pathology

## 2020-03-09 DIAGNOSIS — R739 Hyperglycemia, unspecified: Secondary | ICD-10-CM

## 2020-03-09 LAB — GLUCOSE, CAPILLARY: Glucose-Capillary: 144 mg/dL — ABNORMAL HIGH (ref 70–99)

## 2020-03-09 NOTE — Progress Notes (Signed)
Speech Language Pathology Daily Session Note  Patient Details  Name: Kevin [name] MRN: [id number] Date of Birth: June 11, 1951  Today's Date: 03/09/2020 SLP Individual Time: 3300-7622 SLP Individual Time Calculation (min): 45 min  Short Term Goals: Week 1: SLP Short Term Goal 1 (Week 1): Pt will sustain attention to functional tasks for 5 minute intervals with Max A cues for redirection. SLP Short Term Goal 2 (Week 1): Pt will demonsrate ability to problem solve in basic familiar functional situations with Max A verbal/visual cues. SLP Short Term Goal 3 (Week 1): Pt will identify 1 physical and 1 cognitive impairment with Max A verbal/visual cues. SLP Short Term Goal 4 (Week 1): Pt will recall new and/or daily information with Max A verbal/visual cues for use of aids or other compensatory memory strategies. SLP Short Term Goal 5 (Week 1): Pt will follow 2-step directions with 50% accuracy provided Max A verbal and visual cues.  Skilled Therapeutic Interventions: Pt was seen for skilled ST targeting cognitive goals. Mod A verbal and visual cues for accurate use of calendar as external aid to orient to date. Mod A also required for recall of date at end of session. SLP further facilitated session with Max A verbal and visual cues for problem solving and error awareness during a basic money management task displaying set amounts of money, as well as adding and subtracting money. Use of writing to conduct calculations aided in problem solving as well as written aid for working memory throughout task. Pt sustained attention to functional tasks with only Min A verbal cues for redirection throughout session today. Pt left laying in bed with alarm set and needs within reach. Continue per current plan of care.   Also of note, spoke with pt's RN regarding recommendation for set up and intermittent supervision during meals due to cognition.     Pain Pain Assessment Pain Scale: 0-10 Pain Score: 0-No  pain  Therapy/Group: Individual Therapy  [name] 03/09/2020, 7:21 AM

## 2020-03-09 NOTE — Progress Notes (Signed)
Occupational Therapy Session Note  Patient Details  Name: Kevin [name] MRN: [id number] Date of Birth: August 28, 1951  Today's Date: 03/09/2020 OT Individual Time: 1000-1100 OT Individual Time Calculation (min): 60 min    Short Term Goals: Week 1:  OT Short Term Goal 1 (Week 1): Pt will donn/doff shirt with min assist OT Short Term Goal 2 (Week 1): Pt will bathe LB with mod assist OT Short Term Goal 3 (Week 1): Pt will complete toilet transfer using BSC with min assist OT Short Term Goal 4 (Week 1): Pt will complete LB dressing with mod assist.  Skilled Therapeutic Interventions/Progress Updates:    Pt supine in bed reporting he had a hard time sleeping last night.  Pt agreeable to bathing in shower today.  Mod assist with max VCs and mod TCs to roll to right and complete supine to sit EOB.  Pt transferred EOB to shower bench using Stedy with min assist and mod VCs to attend to RUE and to initiate grasping of Stedy bar.  Pt completed UB bathing with mod assist to wash back and left arm needing hand over hand due to pt attempting to hold wash cloth in right hand but unable to grasp tight enough and impaired motor planning.  Pt bathed LB with mod assist as well needing assist to wash lower aspect of BLE and feet; pt stood with CGA for balance using grab bars with VCs to initiate RUE grasp and assist to wash buttocks. CGA stand to sit at shower bench.  Max assist to dry body and floor with simultaneous VCs to facilitate pursed lip breathing due to pt with mild shortness of breath.  Pt completed Stedy transfer shower bench to w/c with min assist.  Pt donned shirt overhead with mod assist and multimodal cueing to attend to right arm.  Pt donned briefs (pre attached to simulate underwear) and shorts with mod assist to thread and pull over hips in standing at sink. Socks and shoes donned with max assist due to time constraint. Pt reports he wants to brush his teeth however pt needing needing multimodal cues  to initiate task.  Pt brushed teeth with min assist for placing toothpaste on toothbrush and bringing cup to mouth for rinsing.  Pt in w/c with seatbealt alarm donned/on and call bell in reach.  Pt exhibited improved activity tolerance today with fair sitting balance throughout functional activity.  Pt still exhibiting some right sided neglect needing multimodal cues to attend and also RUE weakness and impaired RUE motor planning which hinders ability to complete UB bathing and dressing as well as other BADLs.    Therapy Documentation Precautions:  Precautions Precautions: Fall Precaution Comments: fatigues VERY easily, need to watch O2 sats, R hemiplegia and neglect, impaired cognition Restrictions Weight Bearing Restrictions: No   Therapy/Group: Individual Therapy  Amie Critchley 03/09/2020, 10:44 AM

## 2020-03-09 NOTE — Progress Notes (Signed)
Occupational Therapy Session Note  Patient Details  Name: Kevin [name] MRN: [id number] Date of Birth: [birth year]  Today's Date: 03/09/2020 OT Individual Time: 1130-1200 OT Individual Time Calculation (min): 30 min    Short Term Goals: Week 1:  OT Short Term Goal 1 (Week 1): Pt will donn/doff shirt with min assist OT Short Term Goal 2 (Week 1): Pt will bathe LB with mod assist OT Short Term Goal 3 (Week 1): Pt will complete toilet transfer using BSC with min assist OT Short Term Goal 4 (Week 1): Pt will complete LB dressing with mod assist.      Skilled Therapeutic Interventions/Progress Updates:    Pt received in w/c sleeping.  Pt woke with arm rub but kept falling asleep needed to be kept awake with arm rubs. Pt set up with tray table at his R side to engage in A/arom for RUE for NMR. R arm placed on top of towel to slide arm.    Pt did quite well with frequent prompts of using active elbow flexion to push a bottle off the table for numerous reps with no guiding A. Trace elb extension felt.  Active finger flex/ext with foam block.  Needed A to push arm forward and back with active movement felt in sh flexion.    Needed cues to visually attend to R side.    Pt continuing to rest in wc with tray table set up for lunch.  Belt alarm on and all needs met.  Therapy Documentation Precautions:  Precautions Precautions: Fall Precaution Comments: fatigues VERY easily, need to watch O2 sats, R hemiplegia and neglect, impaired cognition Restrictions Weight Bearing Restrictions: No   Vital Signs: Therapy Vitals Temp: 97.7 F (36.5 C) Pulse Rate: 75 Resp: 20 BP: 116/80 Patient Position (if appropriate): Sitting Oxygen Therapy SpO2: 98 % Pain: Pain Assessment Pain Scale: 0-10 Pain Score: 0-No pain ADL: ADL Eating: Moderate cueing, Set up, Supervision/safety Where Assessed-Eating: Bed level Grooming: Minimal assistance Where Assessed-Grooming: Sitting at sink Upper Body  Bathing: Moderate assistance Where Assessed-Upper Body Bathing: Sitting at sink Lower Body Bathing: Maximal assistance Where Assessed-Lower Body Bathing: Sitting at sink Upper Body Dressing: Moderate assistance Where Assessed-Upper Body Dressing: Sitting at sink Lower Body Dressing: Moderate assistance Where Assessed-Lower Body Dressing: Standing at sink, Sitting at sink Toileting: Maximal assistance Where Assessed-Toileting: Bedside Commode Toilet Transfer: Moderate assistance Toilet Transfer Method: Stand pivot Toilet Transfer Equipment: Engineer, technical sales Transfer: Not assessed  Therapy/Group: Individual Therapy  [place] 03/09/2020, 12:54 PM

## 2020-03-09 NOTE — Progress Notes (Signed)
[place] PHYSICAL MEDICINE & REHABILITATION PROGRESS NOTE  Subjective/Complaints: Patient seen sitting up in bed this morning.  He states he slept well overnight.  Some language of confusion noted.  He appears to have had a bowel movement with therapies yesterday, discussed with nursing.  ROS: Denies CP, shortness of breath, N/V/D  Objective: Vital Signs: Blood pressure 116/80, pulse 75, temperature 97.7 F (36.5 C), resp. rate 20, height 6' (1.829 m), weight 99.8 kg, SpO2 98 %. No results found. Recent Labs    03/07/20 0705  WBC 6.7  HGB 15.5  HCT 45.3  PLT 82*   Recent Labs    03/07/20 0705  NA 140  K 3.8  CL 109  CO2 22  GLUCOSE 126*  BUN 15  CREATININE 1.40*  CALCIUM 8.8*    Physical Exam: BP 116/80 (BP Location: Right Arm)   Pulse 75   Temp 97.7 F (36.5 C)   Resp 20   Ht 6' (1.829 m)   Wt 99.8 kg   SpO2 98%   BMI 29.84 kg/m  Constitutional: No distress . Vital signs reviewed. HENT: Normocephalic.  Atraumatic. Eyes: EOMI. No discharge. Cardiovascular: No JVD.  RRR. Respiratory: Normal effort.  No stridor.  Bilaterally clear to auscultation. GI: Non-distended.  BS +. Skin: Warm and dry.  Intact. Psych: Slowed.  Confused. Musc: No edema in extremities.  No tenderness in extremities. Neuro: Alert Right facial weakness Dysarthria, stable Motor: LUE/LLE: 5/5 proximal to distal RUE: Shoulder abduction 3/5, distally 3/5 with apraxia RLE: 3+/5 proximal to distal  Assessment/Plan: 1. Functional deficits secondary to left MCA/ACA infarcts which require 3+ hours per day of interdisciplinary therapy in a comprehensive inpatient rehab setting.  Physiatrist is providing close team supervision and 24 hour management of active medical problems listed below.  Physiatrist and rehab team continue to assess barriers to discharge/monitor patient progress toward functional and medical goals  Care Tool:  Bathing    Body parts bathed by patient: Chest, Abdomen,  Right upper leg, Left upper leg, Face   Body parts bathed by helper: Left arm, Right lower leg, Left lower leg, Right arm Body parts n/a: Buttocks, Front perineal area   Bathing assist Assist Level: Maximal Assistance - Patient 24 - 49%     Upper Body Dressing/Undressing Upper body dressing Upper body dressing/undressing activity did not occur (including orthotics): N/A What is the patient wearing?: Pull over shirt    Upper body assist Assist Level: Maximal Assistance - Patient 25 - 49%    Lower Body Dressing/Undressing Lower body dressing    Lower body dressing activity did not occur: N/A What is the patient wearing?: Pants     Lower body assist Assist for lower body dressing: Maximal Assistance - Patient 25 - 49%     Toileting Toileting Toileting Activity did not occur (Clothing management and hygiene only): N/A (no void or bm)  Toileting assist Assist for toileting: 2 Helpers (cath)     Transfers Chair/bed transfer  Transfers assist  Chair/bed transfer activity did not occur: N/A  Chair/bed transfer assist level: Maximal Assistance - Patient 25 - 49% (squat pivot)     Locomotion Ambulation   Ambulation assist      Assist level: 2 helpers Assistive device: Parallel bars Max distance: 2   Walk 10 feet activity   Assist  Walk 10 feet activity did not occur: Safety/medical concerns (fatigue)        Walk 50 feet activity   Assist Walk 50 feet with 2  turns activity did not occur: Safety/medical concerns (fatigue)         Walk 150 feet activity   Assist Walk 150 feet activity did not occur: Safety/medical concerns (fatigue)         Walk 10 feet on uneven surface  activity   Assist Walk 10 feet on uneven surfaces activity did not occur: Safety/medical concerns (fatigue)         Wheelchair     Assist Will patient use wheelchair at discharge?: Yes Type of Wheelchair: Manual    Wheelchair assist level: Dependent - Patient 0%       Wheelchair 50 feet with 2 turns activity    Assist        Assist Level: Dependent - Patient 0%   Wheelchair 150 feet activity     Assist     Assist Level: Dependent - Patient 0%      Medical Problem List and Plan: 1. Right sided weakness with poor safety awareness and decrease in problem solving affecting mobility and ADLs secondary to Left ACA/MCA watershed infarct, small left basal ganglia and thalamic infarct, left parietal occipital infarct on 03/02/20.  Continue CIR 2. Antithrombotics: -DVT/anticoagulation:Pharmaceutical:Other (comment)--Eliquis. -antiplatelet therapy: N/A 3. Pain Management:N/A 4. Mood:LCSW to follow for evaluation and support. -antipsychotic agents: N/a 5. Neuropsych: This patientisnot fully capable of making decisions on hisown behalf. 6. Skin/Wound Care:Routine pressure relief measures. 7. Fluids/Electrolytes/Nutrition:Monitor I/Os 8. HTN: Monitor BP   Lasix/Entresto/aldactone resumed 06/14.   Labile on 6/17, avoid hypotension  Monitor with increased mobility 9. Chronic systolic CHF: Monitor Monitor for signs of overload. Continue Coreg bid, Lasix, Entresto,aldactone and Lipitor.   No ASA due to low platelets.    Filed Weights   03/07/20 0537 03/08/20 0348 03/09/20 0217  Weight: 101.9 kg 100.4 kg 99.8 kg   Stable on 6/17 10. Cirrhosis of the liver with chronic thrombocytopenia: Monitor for signs of bleeding or decompensation.   LFTs WNL on 6/15  Plts 82 on 6/15  Labs ordered for tomorrow 11. Chronic SOB: Multifactorial--? OSA. On Trilegy for reactive airway disease. Duo nebs prn. Encourage IS.   Monitor with increased exertion 12. Polysubstance abuse + Cocaine and THC on admit : Has been ongoing since 2015. Educate/encourage patient regarding life style changes.  13. Seizure disorder: Continue Keppra bid.  14. Impaired fasting glucose/Hyperglycemia: Hgb A1c- 5.2.   Monitor with increased  mobility 15. CKD stage III:   Cr.  1.40 on 6/15, labs ordered for tomorrow  Cont to monitor 16. Constipation  Bowel meds increased on 6/15, again on 6/16  Improving  LOS: 3 days A FACE TO FACE EVALUATION WAS PERFORMED  Taytum Scheck Lorie Phenix 03/09/2020, 1:16 PM

## 2020-03-09 NOTE — Plan of Care (Signed)
  Problem: Consults Goal: RH STROKE PATIENT EDUCATION Description: See Patient Education module for education specifics  Outcome: Progressing   Problem: RH BOWEL ELIMINATION Goal: RH STG MANAGE BOWEL WITH ASSISTANCE Description: STG Manage Bowel with min Assistance. Outcome: Progressing   Problem: RH BLADDER ELIMINATION Goal: RH STG MANAGE BLADDER WITH ASSISTANCE Description: STG Manage Bladder With min Assistance Outcome: Progressing   Problem: RH SAFETY Goal: RH STG ADHERE TO SAFETY PRECAUTIONS W/ASSISTANCE/DEVICE Description: STG Adhere to Safety Precautions With cues and reminders. Outcome: Progressing   Problem: RH COGNITION-NURSING Goal: RH STG USES MEMORY AIDS/STRATEGIES W/ASSIST TO PROBLEM SOLVE Description: STG Uses Memory Aids/Strategies With supervision Assistance to Problem Solve. Outcome: Progressing   Problem: RH PAIN MANAGEMENT Goal: RH STG PAIN MANAGED AT OR BELOW PT'S PAIN GOAL Description: Pain level less than 4 on scale of 0-10 Outcome: Progressing   Problem: RH KNOWLEDGE DEFICIT Goal: RH STG INCREASE KNOWLEDGE OF HYPERTENSION Description: Pt will be able to adhere to medication regimen, dietary restriction and lifestyle modifications to better control hypertension and further complications with min assist from family upon discharge.  Outcome: Progressing Goal: RH STG INCREASE KNOWLEGDE OF HYPERLIPIDEMIA Description: Pt will be able to adhere to medication regimen, dietary restriction and lifestyle modifications to prevent high cholesterol and further complications with min assist from family upon discharge.  Outcome: Progressing Goal: RH STG INCREASE KNOWLEDGE OF STROKE PROPHYLAXIS Description: Pt will be able to adhere to medication regimen, dietary restriction and lifestyle modifications to prevent stroke and further complications with min assist from family upon discharge.  Outcome: Progressing   

## 2020-03-09 NOTE — Progress Notes (Signed)
Physical Therapy Session Note  Patient Details  Name: Kevin [name] MRN: [id number] Date of Birth: 02/15/1951  Today's Date: 03/09/2020 PT Individual Time: 1425-1445 PT Individual Time Calculation (min): 20 min   Short Term Goals: Week 1:  PT Short Term Goal 1 (Week 1): Patient to be able to perform bed to chair transfers with ModA PT Short Term Goal 2 (Week 1): Patient to be able to propel WC 32f with ModA PT Short Term Goal 3 (Week 1): Patient to be able to maintain standing for 3-4 minutes in stedy without fatigue PT Short Term Goal 4 (Week 1): Patient to be able to gait train 533fwith maxA and WC follow  Skilled Therapeutic Interventions/Progress Updates:    Patient received in bed, frustrated at slow progress but willing to participate in PT today. Able to complete all bed mobility with MinA, initially needed ModA for sit to stand transfers but this faded to close S from standard wheelchair height with RW today!!!! Tolerated gait training approximately 1024fhen [id number] RW and no shortness of breath or signs of desaturation noted but did require MinA for safety, RW management, and advancement of R LE as well as Mod cues for sequencing overall. Fatigued at [facility] [place] encouraged by progress today. Left in bed with all needs met, bed alarm active.   Therapy Documentation Precautions:  Precautions Precautions: Fall Precaution Comments: fatigues VERY easily, need to watch O2 sats, R hemiplegia and neglect, impaired cognition Restrictions Weight Bearing Restrictions: No General: PT Amount of Missed Time (min): 25 Minutes PT Missed Treatment Reason: Other (Comment) (nursing care) Vital Signs:   Pain: Pain Assessment Pain Scale: 0-10 Pain Score: 0-No pain    Therapy/Group: Individual Therapy   KrisWindell NorfolkT, PN1   Supplemental Physical Therapist [facility]one[facility]wayPager [phone] Rehab Office [phone]/17/2021, 3:59 PM

## 2020-03-10 ENCOUNTER — Inpatient Hospital Stay (HOSPITAL_COMMUNITY): Payer: Medicare HMO | Admitting: Occupational Therapy

## 2020-03-10 ENCOUNTER — Inpatient Hospital Stay (HOSPITAL_COMMUNITY): Payer: Medicare HMO

## 2020-03-10 DIAGNOSIS — D7589 Other specified diseases of blood and blood-forming organs: Secondary | ICD-10-CM

## 2020-03-10 LAB — URINALYSIS, ROUTINE W REFLEX MICROSCOPIC
Bilirubin Urine: NEGATIVE
Glucose, UA: NEGATIVE mg/dL
Ketones, ur: NEGATIVE mg/dL
Nitrite: NEGATIVE
Protein, ur: 100 mg/dL — AB
Specific Gravity, Urine: 1.016 (ref 1.005–1.030)
pH: 5 (ref 5.0–8.0)

## 2020-03-10 LAB — CBC WITH DIFFERENTIAL/PLATELET
Abs Immature Granulocytes: 0.03 10*3/uL (ref 0.00–0.07)
Basophils Absolute: 0 10*3/uL (ref 0.0–0.1)
Basophils Relative: 0 %
Eosinophils Absolute: 0.2 10*3/uL (ref 0.0–0.5)
Eosinophils Relative: 2 %
HCT: 46.9 % (ref 39.0–52.0)
Hemoglobin: 15.9 g/dL (ref 13.0–17.0)
Immature Granulocytes: 0 %
Lymphocytes Relative: 4 %
Lymphs Abs: 0.3 10*3/uL — ABNORMAL LOW (ref 0.7–4.0)
MCH: 34.4 pg — ABNORMAL HIGH (ref 26.0–34.0)
MCHC: 33.9 g/dL (ref 30.0–36.0)
MCV: 101.5 fL — ABNORMAL HIGH (ref 80.0–100.0)
Monocytes Absolute: 0.7 10*3/uL (ref 0.1–1.0)
Monocytes Relative: 10 %
Neutro Abs: 6.3 10*3/uL (ref 1.7–7.7)
Neutrophils Relative %: 84 %
Platelets: 77 10*3/uL — ABNORMAL LOW (ref 150–400)
RBC: 4.62 MIL/uL (ref 4.22–5.81)
RDW: 11.6 % (ref 11.5–15.5)
WBC: 7.6 10*3/uL (ref 4.0–10.5)
nRBC: 0 % (ref 0.0–0.2)

## 2020-03-10 LAB — BASIC METABOLIC PANEL
Anion gap: 9 (ref 5–15)
BUN: 21 mg/dL (ref 8–23)
CO2: 23 mmol/L (ref 22–32)
Calcium: 8.8 mg/dL — ABNORMAL LOW (ref 8.9–10.3)
Chloride: 108 mmol/L (ref 98–111)
Creatinine, Ser: 1.64 mg/dL — ABNORMAL HIGH (ref 0.61–1.24)
GFR calc Af Amer: 49 mL/min — ABNORMAL LOW (ref >60)
GFR calc non Af Amer: 42 mL/min — ABNORMAL LOW (ref >60)
Glucose, Bld: 119 mg/dL — ABNORMAL HIGH (ref 70–99)
Potassium: 3.8 mmol/L (ref 3.5–5.1)
Sodium: 140 mmol/L (ref 135–145)

## 2020-03-10 LAB — FOLATE: Folate: 16.1 ng/mL (ref >5.9)

## 2020-03-10 MED ORDER — SENNA 8.6 MG PO TABS: 1.0000 | ORAL_TABLET | Freq: Two times a day (BID) | ORAL | Status: DC

## 2020-03-10 MED ORDER — POLYETHYLENE GLYCOL 3350 17 G PO PACK: 17.0000 g | PACK | Freq: Every day | ORAL | Status: DC

## 2020-03-10 MED ORDER — BETHANECHOL CHLORIDE 10 MG PO TABS
10.0000 mg | ORAL_TABLET | Freq: Four times a day (QID) | ORAL | Status: DC
  Administered 2020-03-10 – 2020-03-13 (×12): 10 mg via ORAL
  Filled 2020-03-10 (×12): qty 1

## 2020-03-10 MED ORDER — TAMSULOSIN HCL 0.4 MG PO CAPS
0.4000 mg | ORAL_CAPSULE | Freq: Every day | ORAL | Status: DC
  Administered 2020-03-10 – 2020-03-14 (×5): 0.4 mg via ORAL
  Filled 2020-03-10 (×5): qty 1

## 2020-03-10 MED ORDER — SENNA 8.6 MG PO TABS: 2.0000 | ORAL_TABLET | Freq: Every day | ORAL | Status: DC

## 2020-03-10 NOTE — Progress Notes (Signed)
U/A with C&S collected and sent to lab

## 2020-03-10 NOTE — Progress Notes (Signed)
RT placed patient on CPAP HS. NO O2 bleed in needed. Patient tolerating well.  

## 2020-03-10 NOTE — Progress Notes (Signed)
[place] PHYSICAL MEDICINE & REHABILITATION PROGRESS NOTE  Subjective/Complaints: Patient seen sitting up in a chair this morning, working with therapies.  He states he slept well overnight.  Discussed shortness of breath/pulse ox, hand dominance with therapies.  Patient states he had frequent stools yesterday.  ROS: Denies CP, shortness of breath, N/V  Objective: Vital Signs: Blood pressure 112/90, pulse 73, temperature 98.7 F (37.1 C), resp. rate 18, height 6' (1.829 m), weight 99.8 kg, SpO2 98 %. No results found. Recent Labs    03/10/20 0447  WBC 7.6  HGB 15.9  HCT 46.9  PLT 77*   Recent Labs    03/10/20 0447  NA 140  K 3.8  CL 108  CO2 23  GLUCOSE 119*  BUN 21  CREATININE 1.64*  CALCIUM 8.8*    Physical Exam: BP 112/90 (BP Location: Left Arm)   Pulse 73   Temp 98.7 F (37.1 C)   Resp 18   Ht 6' (1.829 m)   Wt 99.8 kg   SpO2 98%   BMI 29.84 kg/m  Constitutional: No distress . Vital signs reviewed. HENT: Normocephalic.  Atraumatic. Eyes: EOMI. No discharge. Cardiovascular: No JVD.  RRR. Respiratory: Normal effort.  No stridor.  Bilaterally clear to auscultation. GI: Non-distended. Skin: Warm and dry.  Intact. Psych: Slowed, somewhat confused. Musc: No edema in extremities.  No tenderness in extremities. Neuro: Alert Right facial weakness Dysarthria, stable Motor: LUE/LLE: 5/5 proximal to distal RUE: Shoulder abduction 3+/5, distally 3/5 with apraxia RLE: 4 -/5 proximal to distal with apraxia  Assessment/Plan: 1. Functional deficits secondary to left MCA/ACA infarcts which require 3+ hours per day of interdisciplinary therapy in a comprehensive inpatient rehab setting.  Physiatrist is providing close team supervision and 24 hour management of active medical problems listed below.  Physiatrist and rehab team continue to assess barriers to discharge/monitor patient progress toward functional and medical goals  Care Tool:  Bathing    Body  parts bathed by patient: Right arm, Chest, Abdomen, Front perineal area, Right upper leg, Left upper leg, Face   Body parts bathed by helper: Left arm, Left lower leg, Right lower leg, Buttocks Body parts n/a: Buttocks, Front perineal area   Bathing assist Assist Level: Moderate Assistance - Patient 50 - 74%     Upper Body Dressing/Undressing Upper body dressing Upper body dressing/undressing activity did not occur (including orthotics): N/A What is the patient wearing?: Pull over shirt    Upper body assist Assist Level: Moderate Assistance - Patient 50 - 74%    Lower Body Dressing/Undressing Lower body dressing    Lower body dressing activity did not occur: N/A What is the patient wearing?: Incontinence brief, Pants     Lower body assist Assist for lower body dressing: Moderate Assistance - Patient 50 - 74%     Toileting Toileting Toileting Activity did not occur Landscape architect and hygiene only): N/A (no void or bm)  Toileting assist Assist for toileting: 2 Helpers (cath)     Transfers Chair/bed transfer  Transfers assist     Chair/bed transfer assist level: Minimal Assistance - Patient > 75%     Locomotion Ambulation   Ambulation assist      Assist level: 2 helpers Assistive device: Walker-rolling Max distance: 78ft   Walk 10 feet activity   Assist  Walk 10 feet activity did not occur: Safety/medical concerns (fatigue)  Assist level: Minimal Assistance - Patient > 75% Assistive device: Walker-rolling   Walk 50 feet activity   Assist  Walk 50 feet with 2 turns activity did not occur: Safety/medical concerns (fatigue)         Walk 150 feet activity   Assist Walk 150 feet activity did not occur: Safety/medical concerns (fatigue)         Walk 10 feet on uneven surface  activity   Assist Walk 10 feet on uneven surfaces activity did not occur: Safety/medical concerns (fatigue)         Wheelchair     Assist Will patient use  wheelchair at discharge?: Yes Type of Wheelchair: Manual    Wheelchair assist level: Dependent - Patient 0%      Wheelchair 50 feet with 2 turns activity    Assist        Assist Level: Dependent - Patient 0%   Wheelchair 150 feet activity     Assist     Assist Level: Dependent - Patient 0%      Medical Problem List and Plan: 1. Right sided weakness with poor safety awareness and decrease in problem solving affecting mobility and ADLs secondary to Left ACA/MCA watershed infarct, small left basal ganglia and thalamic infarct, left parietal occipital infarct on 03/02/20.  Continue CIR 2. Antithrombotics: -DVT/anticoagulation:Pharmaceutical:Other (comment)--Eliquis. -antiplatelet therapy: N/A 3. Pain Management:N/A 4. Mood:LCSW to follow for evaluation and support. -antipsychotic agents: N/a 5. Neuropsych: This patientisnot fully capable of making decisions on hisown behalf. 6. Skin/Wound Care:Routine pressure relief measures. 7. Fluids/Electrolytes/Nutrition:Monitor I/Os 8. HTN: Monitor BP   Lasix/Entresto/aldactone resumed 06/14.   Slightly labile on 6/18  Monitor with increased mobility 9. Chronic systolic CHF: Monitor Monitor for signs of overload. Continue Coreg bid, Lasix, Entresto,aldactone and Lipitor.   No ASA due to low platelets.    Filed Weights   03/07/20 0537 03/08/20 0348 03/09/20 0217  Weight: 101.9 kg 100.4 kg 99.8 kg   Improved on 6/17 10. Cirrhosis of the liver with chronic thrombocytopenia: Monitor for signs of bleeding or decompensation.   LFTs WNL on 6/15  Plts 77 on 6/18, labs ordered for Monday 11. Chronic SOB: Multifactorial--? OSA. On Trilegy for reactive airway disease. Duo nebs prn. Encourage IS.   Monitor with increased exertion  Stable on 6/18 12. Polysubstance abuse + Cocaine and THC on admit : Has been ongoing since 2015. Educate/encourage patient regarding life style changes.  13. Seizure  disorder: Continue Keppra bid.  14. Impaired fasting glucose/Hyperglycemia: Hgb A1c- 5.2.   Blood glucose elevated on 6/18  Monitor with increased mobility 15.?  AKI on CKD stage III:   Cr.  1.64 on 6/18, labs ordered for Monday  Encourage fluids  Cont to monitor 16. Constipation  Bowel meds increased on 6/15, again on 6/16, decreased on 6/18 17.  Macrocytosis  Vitamin B12/folate ordered for Monday  LOS: 4 days A FACE TO FACE EVALUATION WAS PERFORMED  Kevin [name] 03/10/2020, 11:12 AM

## 2020-03-10 NOTE — Progress Notes (Signed)
Physical Therapy Session Note  Patient Details  Name: Kevin [name] MRN: [id number] Date of Birth: 09/18/1951  Today's Date: 03/10/2020 PT Individual Time: 1115-1200 PT Individual Time Calculation (min): 45 min   Short Term Goals: Week 1:  PT Short Term Goal 1 (Week 1): Patient to be able to perform bed to chair transfers with ModA PT Short Term Goal 2 (Week 1): Patient to be able to propel WC 19ft with ModA PT Short Term Goal 3 (Week 1): Patient to be able to maintain standing for 3-4 minutes in stedy without fatigue PT Short Term Goal 4 (Week 1): Patient to be able to gait train 35ft with maxA and WC follow Week 2:    Week 3:     Skilled Therapeutic Interventions/Progress Updates:    PAIN Denies pain Pt initially supine and agreeable to treatment.  Supine to sit w/cues, rail, and cga, additional time. SPT bed to wc w/mod assist of 1, cues to attend to R exts. Pt transported to hall for gait training. STS w/min to mod assist Gait trials as below: 66ft w/mod assist, use of rail on L, max cues for clearance RLE thru swing, poor clearance 75% steps, multimodal cues for wt shift to L, therapist providing manual guarding of R knee w/loading to midstance.  28ft w/mod assist, rail on L, gait as above but w/improved clearance and step length on R w/max multimodal cues to attend to R, no buckling of R knee but maintains knee flexion at knee when full clearance not achieved w/swing.  29ft w/mod assist, rail on L, again improved gait pattern w/poor clearance only 50% steps, heel strike achieved 50%, cues as above.   Pt inquired about use of quad cane, discussed importance of practicing normalized gait pattern/use of RW ossibly TM/LiteGait as "practice tool".  Will likely need repeated education.  Also discussed inattention due to CVA and pt voiced some insight into this deficit.  wc propulsion x 43ft w/bilat LEs and max cues to attend to RLE w/task.  At end of session pt transported to  room. Pt declined use of alarm belt.  Educated pt re use w/all pts to prevent falls, pt then agreed. Pt left oob in wc w/alarm belt set and needs in reach   Therapy Documentation Precautions:  Precautions Precautions: Fall Precaution Comments: fatigues VERY easily, need to watch O2 sats, R hemiplegia and neglect, impaired cognition Restrictions Weight Bearing Restrictions: No   Therapy/Group: Individual Therapy  Rada Hay, PT   Shearon Balo 03/10/2020, 12:29 PM

## 2020-03-10 NOTE — Progress Notes (Signed)
Occupational Therapy Session Note  Patient Details  Name: Kevin [name] MRN: [id number] Date of Birth: 1950-10-05  Today's Date: 03/10/2020 OT Individual Time:First session: [phone]; second session: 2:30-330pm OT Individual Time Calculation (min): First session: 45 min; Second session: 60 min    Short Term Goals: Week 1:  OT Short Term Goal 1 (Week 1): Pt will donn/doff shirt with min assist OT Short Term Goal 2 (Week 1): Pt will bathe LB with mod assist OT Short Term Goal 3 (Week 1): Pt will complete toilet transfer using BSC with min assist OT Short Term Goal 4 (Week 1): Pt will complete LB dressing with mod assist.  Skilled Therapeutic Interventions/Progress Updates:    First Session: Pt up on toilet with nurse tech upon OT arrival with continent bowel episode. No complaints of pain.  Pt completed sit to stand from toilet using stedy to pull up with min assist.  Pt stood with CGA for balance and max assist for pericare.  Pt completed stedy transfer toilet to w/c with CGA for sitting balance.  Pt able to reposition upright in w/c with mod VCs to initiate.  Pt doffed/donned tshirt overhead with min assist and donned brief and shorts with min assist and increased time for motor processing.  Pt setup with breakfast tray and provided distant supervision to ensure pt stayed on task for increased food intake.  Pt demonstrated improved initiation and attention to RUE during UB dressing today.  Call bell in reach, safety belt donned/on.  Second Session: Pt asleep in bed needing increased time to arouse. No complaints of pain. Pt reports he is too tired to shower in walk-in but would like to sponge bath at sink.  Pt completed roll to right and supine to sit with max VCs for body mechanics and sequencing and mod assist to complete.  Pt completed SPT EOB to w/c using RW with min assist for balance and max VCs for initiation of grasping RW with RUE and sequencing through steps of transfer.  Pt doffed  shirt with min assist and cues to facilitate RUE assist.  Pt washed UB, demonstrating self initiation of RUE use to squeeze washcloth and wash under left arm.  Pt brushed teeth with supervision for occasional problem solving cues. Pt's nurse tech arrived for bladder scan, therefore pt completed SPT w/c to EOB with RW needing cues to reach with RUE for handle and min assist for balance.  Sit to supine with edu on proper body mechanics; poor follow through return demonstrated with pt laying directly on back at diagonal and needing mod assist to reposition.  Educated pt on deep pursed lip breathing during bladder scan in supine due to increased SOB with labored breathing noted.  Pt donned shirt at bed level with improved follow through of compensatory technique, needing min assist to roll to left and pull shirt down in back.  Pt asking at end of session regarding when he will go home.  Reviewed orientation to current day using calendar and visual feedback of tentative dc date provided.  Call bell in reach, bed alarm on.  Therapy Documentation Precautions:  Precautions Precautions: Fall Precaution Comments: fatigues VERY easily, need to watch O2 sats, R hemiplegia and neglect, impaired cognition Restrictions Weight Bearing Restrictions: No      Therapy/Group: Individual Therapy  Amie Critchley 03/10/2020, 10:05 AM

## 2020-03-11 ENCOUNTER — Inpatient Hospital Stay (HOSPITAL_COMMUNITY): Payer: Medicare HMO | Admitting: Speech Pathology

## 2020-03-11 ENCOUNTER — Inpatient Hospital Stay (HOSPITAL_COMMUNITY): Payer: Medicare HMO

## 2020-03-11 ENCOUNTER — Inpatient Hospital Stay (HOSPITAL_COMMUNITY): Payer: Medicare HMO | Admitting: Physical Therapy

## 2020-03-11 LAB — URINE CULTURE: Culture: NO GROWTH

## 2020-03-11 NOTE — Plan of Care (Signed)
  Problem: RH BLADDER ELIMINATION Goal: RH STG MANAGE BLADDER WITH ASSISTANCE Description: STG Manage Bladder With min Assistance Outcome: Not Progressing; in and out cath Problem: RH KNOWLEDGE DEFICIT Goal: RH STG INCREASE KNOWLEDGE OF HYPERTENSION Description: Pt will be able to adhere to medication regimen, dietary restriction and lifestyle modifications to better control hypertension and further complications with min assist from family upon discharge.  Outcome: Not Progressing; needs reinforcement   Problem: RH KNOWLEDGE DEFICIT Goal: RH STG INCREASE KNOWLEGDE OF HYPERLIPIDEMIA Description: Pt will be able to adhere to medication regimen, dietary restriction and lifestyle modifications to prevent high cholesterol and further complications with min assist from family upon discharge.  Outcome: Not Progressing; needs reiforcement

## 2020-03-11 NOTE — Progress Notes (Signed)
Occupational Therapy Session Note  Patient Details  Name: Kevin [name] MRN: [id number] Date of Birth: [birth year]  Today's Date: 03/11/2020 OT Individual Time: 1115-1200 OT Individual Time Calculation (min): 45 min    Short Term Goals: Week 1:  OT Short Term Goal 1 (Week 1): Pt will donn/doff shirt with min assist OT Short Term Goal 2 (Week 1): Pt will bathe LB with mod assist OT Short Term Goal 3 (Week 1): Pt will complete toilet transfer using BSC with min assist OT Short Term Goal 4 (Week 1): Pt will complete LB dressing with mod assist.  Skilled Therapeutic Interventions/Progress Updates:  Pt received supine in bed, hesitant to participate in session but agreeable with encouragment. Session focus on BADL retraining, dynamic standing balance, functional mobility, and functional use of RUE. Pt complete bed mobility with supervision. Once EOB pt completed LB dressing with MINA. Pt complete sit<>stand with CGA, pt insistent on pulling up on RW despite MAX cues and education. Pt complete household distance functional mobility with RW and CGA with chair follow. Pt reports SOB with O2 97% on RA. Pt transported to therapy gym with total A for time mgmt. Pt completed standing dynamic balance therapeautic activity via dynavision to facilitate functional use of RUE. Pt complete x1 trial 1 mins with pt able reach buttons at shoulder level and below without pain. Pt reports concerns with never having enough time with therapists, provided education on rehab schedule and change in staff on weekends. Pt continues to be disgruntled over schedule but offered support and utilized therapeutic use of self as pt voiced concerns. Pt transported back to room with total A where pt complete 8 ft functional mobility back to bed with RW and CGA. Pt returned self to supine with supervision. Pt left supine in bed with all needs within reach and bed alarm activated.   Therapy Documentation Precautions:   Precautions Precautions: Fall Precaution Comments: fatigues VERY easily, need to watch O2 sats, R hemiplegia and neglect, impaired cognition Restrictions Weight Bearing Restrictions: No General:   Vital Signs: Oxygen Therapy SpO2: 96 % O2 Device: Room Air Pain: Pt reports mild pain when reaching RUE above 90 degrees; educated pt on decreasing ROM as pain mgmt strategy.   Therapy/Group: Individual Therapy  Angelina Pih 03/11/2020, 12:35 PM

## 2020-03-11 NOTE — Progress Notes (Signed)
Speech Language Pathology Daily Session Note  Patient Details  Name: Kevin [name] MRN: [id number] Date of Birth: 02-Nov-1950  Today's Date: 03/11/2020 SLP Individual Time: 0092-3300 SLP Individual Time Calculation (min): 40 min  Short Term Goals: Week 1: SLP Short Term Goal 1 (Week 1): Pt will sustain attention to functional tasks for 5 minute intervals with Max A cues for redirection. SLP Short Term Goal 2 (Week 1): Pt will demonsrate ability to problem solve in basic familiar functional situations with Max A verbal/visual cues. SLP Short Term Goal 3 (Week 1): Pt will identify 1 physical and 1 cognitive impairment with Max A verbal/visual cues. SLP Short Term Goal 4 (Week 1): Pt will recall new and/or daily information with Max A verbal/visual cues for use of aids or other compensatory memory strategies. SLP Short Term Goal 5 (Week 1): Pt will follow 2-step directions with 50% accuracy provided Max A verbal and visual cues.  Skilled Therapeutic Interventions: Skilled treatment session focused on cognitive goals. SLP facilitated sesion by providing Mod-Max A verbal cues for recall of his current medications and their functions. Patient also participated in a basic medication management task and required Max verbal cues for sustained attention and problem solving with task. Patient reported he was unable to complete the task due to difficulty breathing (patient with intermittent increased work of breathing for ~30-45 second intervals). RN made aware and assessed patient. O2 saturations were 96%. Patient became frustrated when SLP had to leave the session as patient reported, " you don't have time for me." Educated patient on scheduled time and how that time was not utilized to its fullest due to multiple issues. Patient left upright in bed with alarm on and all needs within reach. Continue with current plan of care.      Pain No/Denies Pain  Therapy/Group: Individual Therapy  Karilynn Carranza,  Zayvien Canning 03/11/2020, 12:24 PM

## 2020-03-11 NOTE — Progress Notes (Signed)
Physical Therapy Session Note  Patient Details  Name: Kevin [name] MRN: [id number] Date of Birth: 1950-11-04  Today's Date: 03/11/2020 PT Individual Time: 2440-1027 PT Individual Time Calculation (min): 42 min   Short Term Goals: Week 1:  PT Short Term Goal 1 (Week 1): Patient to be able to perform bed to chair transfers with ModA PT Short Term Goal 2 (Week 1): Patient to be able to propel WC 43f with ModA PT Short Term Goal 3 (Week 1): Patient to be able to maintain standing for 3-4 minutes in stedy without fatigue PT Short Term Goal 4 (Week 1): Patient to be able to gait train 537fwith maxA and WC follow  Skilled Therapeutic Interventions/Progress Updates:   Pt received supine in bed and agreeable to PT. Supine>sit transfer with mod assist and min cues for use of the RUE to push into sitting. Squat pivot to the L with min assist and moderate cues UE placement. [facility]  NMR:  minisquat with BUE support on RW. X 10  Reciprocal marches x 10 BLE with BUE support on RW Forward/reverse  1080f 2 with min-CGA. Mild RLE knee instability in reverse.  Gait training with RW 2 x [id number] cues for posture and step length as well as facilitation to perform adequate L weight shift to advance the RLE. Sit<>stnad throughout treatment with min assist and min cues for UE placement to push from WC arm rest   Pt returned to room and performed stand pivot transfer to bed on the L with increased time, RW and min assist. Sit>supine completed with supervision assist, pt and left supine in bed with call bell in reach and all needs met.         Therapy Documentation Precautions:  Precautions Precautions: Fall Precaution Comments: fatigues VERY easily, need to watch O2 sats, R hemiplegia and neglect, impaired cognition Restrictions Weight Bearing Restrictions: No    Vital Signs: Therapy Vitals Pulse Rate: 83 Resp: 16 BP: 123/76 Patient Position (if appropriate):  Lying Oxygen Therapy SpO2: 96 % O2 Device: Room Air Pain: denies   Therapy/Group: Individual Therapy  AustLorie Phenix9/2021, 4:38 PM

## 2020-03-11 NOTE — Progress Notes (Signed)
[place] PHYSICAL MEDICINE & REHABILITATION PROGRESS NOTE  Subjective/Complaints: Denies complaints. When asked, says he still has frequent stools Had increased work of breathing during therapy today. O2 sats 96%.  ROS: Denies CP, N/V  Objective: Vital Signs: Blood pressure (!) 155/103, pulse 83, temperature 98.1 F (36.7 C), temperature source Oral, resp. rate 16, height 6' (1.829 m), weight 99.8 kg, SpO2 96 %. No results found. Recent Labs    03/10/20 0447  WBC 7.6  HGB 15.9  HCT 46.9  PLT 77*   Recent Labs    03/10/20 0447  NA 140  K 3.8  CL 108  CO2 23  GLUCOSE 119*  BUN 21  CREATININE 1.64*  CALCIUM 8.8*    Physical Exam: BP (!) 155/103 (BP Location: Left Arm)   Pulse 83   Temp 98.1 F (36.7 C) (Oral)   Resp 16   Ht 6' (1.829 m)   Wt 99.8 kg   SpO2 96%   BMI 29.84 kg/m  Constitutional: No distress . Vital signs reviewed. HENT: Normocephalic.  Atraumatic. Eyes: EOMI. No discharge. Cardiovascular: No JVD.  RRR. Respiratory: Normal effort.  No stridor.  Bilaterally clear to auscultation. Taking deep breaths.  GI: Non-distended. Skin: Warm and dry.  Intact. Psych: Slowed, somewhat confused. Frustrated during SLP session Musc: No edema in extremities.  No tenderness in extremities. Neuro: Alert Right facial weakness Dysarthria, stable Motor: LUE/LLE: 5/5 proximal to distal RUE: Shoulder abduction 3+/5, distally 3/5 with apraxia RLE: 4 -/5 proximal to distal with apraxia.  Assessment/Plan: 1. Functional deficits secondary to left MCA/ACA infarcts which require 3+ hours per day of interdisciplinary therapy in a comprehensive inpatient rehab setting.  Physiatrist is providing close team supervision and 24 hour management of active medical problems listed below.  Physiatrist and rehab team continue to assess barriers to discharge/monitor patient progress toward functional and medical goals  Care Tool:  Bathing    Body parts bathed by patient:  Right arm, Left arm, Chest, Abdomen, Front perineal area, Left upper leg, Face, Right upper leg   Body parts bathed by helper: Buttocks Body parts n/a: Right lower leg, Left lower leg   Bathing assist Assist Level: Moderate Assistance - Patient 50 - 74%     Upper Body Dressing/Undressing Upper body dressing Upper body dressing/undressing activity did not occur (including orthotics): N/A What is the patient wearing?: Pull over shirt    Upper body assist Assist Level: Moderate Assistance - Patient 50 - 74%    Lower Body Dressing/Undressing Lower body dressing    Lower body dressing activity did not occur: N/A What is the patient wearing?: Pants     Lower body assist Assist for lower body dressing: Minimal Assistance - Patient > 75%     Toileting Toileting Toileting Activity did not occur (Clothing management and hygiene only): N/A (no void or bm)  Toileting assist Assist for toileting: Maximal Assistance - Patient 25 - 49%     Transfers Chair/bed transfer  Transfers assist     Chair/bed transfer assist level: Contact Guard/Touching assist     Locomotion Ambulation   Ambulation assist      Assist level: 2 helpers Assistive device: Hand held assist Max distance: 65   Walk 10 feet activity   Assist  Walk 10 feet activity did not occur: Safety/medical concerns (fatigue)  Assist level: Moderate Assistance - Patient - 50 - 74% Assistive device: Hand held assist   Walk 50 feet activity   Assist Walk 50 feet with 2  turns activity did not occur: Safety/medical concerns (fatigue)         Walk 150 feet activity   Assist Walk 150 feet activity did not occur: Safety/medical concerns (fatigue)         Walk 10 feet on uneven surface  activity   Assist Walk 10 feet on uneven surfaces activity did not occur: Safety/medical concerns (fatigue)         Wheelchair     Assist Will patient use wheelchair at discharge?: Yes Type of Wheelchair:  Manual    Wheelchair assist level: Dependent - Patient 0%      Wheelchair 50 feet with 2 turns activity    Assist        Assist Level: Dependent - Patient 0%   Wheelchair 150 feet activity     Assist     Assist Level: Dependent - Patient 0%      Medical Problem List and Plan: 1. Right sided weakness with poor safety awareness and decrease in problem solving affecting mobility and ADLs secondary to Left ACA/MCA watershed infarct, small left basal ganglia and thalamic infarct, left parietal occipital infarct on 03/02/20.  Continue CIR 2. Antithrombotics: -DVT/anticoagulation:Pharmaceutical:Other (comment)--Eliquis. -antiplatelet therapy: N/A 3. Pain Management:N/A 4. Mood:LCSW to follow for evaluation and support. -antipsychotic agents: N/a 5. Neuropsych: This patientisnot fully capable of making decisions on hisown behalf. 6. Skin/Wound Care:Routine pressure relief measures. 7. Fluids/Electrolytes/Nutrition:Monitor I/Os 8. HTN: Monitor BP   Lasix/Entresto/aldactone resumed 06/14.   Slightly labile on 6/19  Monitor with increased mobility 9. Chronic systolic CHF: Monitor Monitor for signs of overload. Continue Coreg bid, Lasix, Entresto,aldactone and Lipitor.   No ASA due to low platelets.    Filed Weights   03/07/20 0537 03/08/20 0348 03/09/20 0217  Weight: 101.9 kg 100.4 kg 99.8 kg   Improved on 6/17 10. Cirrhosis of the liver with chronic thrombocytopenia: Monitor for signs of bleeding or decompensation.   LFTs WNL on 6/15  Plts 77 on 6/18, labs ordered for Monday 11. Chronic SOB: Multifactorial--? OSA. On Trilegy for reactive airway disease. Duo nebs prn. Encourage IS.   Monitor with increased exertion  Stable on 6/19 12. Polysubstance abuse + Cocaine and THC on admit : Has been ongoing since 2015. Educate/encourage patient regarding life style changes.  13. Seizure disorder: Continue Keppra bid.  14. Impaired  fasting glucose/Hyperglycemia: Hgb A1c- 5.2.   Blood glucose elevated on 6/18  Monitor with increased mobility 15.?  AKI on CKD stage III:   Cr.  1.64 on 6/18, labs ordered for Monday  Encourage fluids  Cont to monitor 16. Constipation  Bowel meds increased on 6/15, again on 6/16, decreased on 6/18, stopped senna on 6/19 since still having loose stool. 17.  Macrocytosis  Vitamin B12/folate ordered for Monday  LOS: 5 days A FACE TO FACE EVALUATION WAS PERFORMED  Clint Bolder P Neftali Thurow 03/11/2020, 2:17 PM

## 2020-03-12 ENCOUNTER — Inpatient Hospital Stay (HOSPITAL_COMMUNITY): Payer: Medicare HMO

## 2020-03-12 ENCOUNTER — Inpatient Hospital Stay (HOSPITAL_COMMUNITY): Payer: Medicare HMO | Admitting: Occupational Therapy

## 2020-03-12 NOTE — Progress Notes (Addendum)
Occupational Therapy Session Note  Patient Details  Name: Kevin [name] MRN: [id number] Date of Birth: 01/30/1951  Today's Date: 03/12/2020 OT Individual Time: 4097-3532 OT Individual Time Calculation (min): 53 min    Short Term Goals: Week 1:  OT Short Term Goal 1 (Week 1): Pt will donn/doff shirt with min assist OT Short Term Goal 2 (Week 1): Pt will bathe LB with mod assist OT Short Term Goal 3 (Week 1): Pt will complete toilet transfer using BSC with min assist OT Short Term Goal 4 (Week 1): Pt will complete LB dressing with mod assist.  Skilled Therapeutic Interventions/Progress Updates:  Patient met lying supine in bed in agreement with OT treatment session with focus on self-care re-education, functional transfers, NMR, and attention to task as detailed below. Supine to EOB with HOB flat and Mod A to bring trunk upright. Squat-pivot transfer to wc on L with cueing for hand placement and R attention. Seated in wc at sink level, patient performed UB bath/dress with Min-Mod A and LB bath/dress in sitting/standing with Mod A and cues for hemi technique. Patient required frequent redirection for attention to task throughout session despite OT efforts to minimize external distractions. Patient also demonstrates emotional lability with periods of extreme frustration and laughter throughout session. Set-up assist for self-feeding. Session concluded with patient seated in wc with belt alarm activated, call bell within reach, and all needs met.   Therapy Documentation Precautions:  Precautions Precautions: Fall Precaution Comments: fatigues VERY easily, need to watch O2 sats, R hemiplegia and neglect, impaired cognition Restrictions Weight Bearing Restrictions: No General:   Vital Signs: Therapy Vitals Temp: 97.8 F (36.6 C) Temp Source: Oral Pulse Rate: 84 BP: (!) 152/97 Patient Position (if appropriate): Lying Oxygen Therapy SpO2: (!) 84 % O2 Device: Room Air Pain:    ADL: ADL Eating: Moderate cueing, Set up, Supervision/safety Where Assessed-Eating: Bed level Grooming: Minimal assistance Where Assessed-Grooming: Sitting at sink Upper Body Bathing: Moderate assistance Where Assessed-Upper Body Bathing: Sitting at sink Lower Body Bathing: Maximal assistance Where Assessed-Lower Body Bathing: Sitting at sink Upper Body Dressing: Moderate assistance Where Assessed-Upper Body Dressing: Sitting at sink Lower Body Dressing: Moderate assistance Where Assessed-Lower Body Dressing: Standing at sink, Sitting at sink Toileting: Maximal assistance Where Assessed-Toileting: Bedside Commode Toilet Transfer: Moderate assistance Toilet Transfer Method: Stand pivot Toilet Transfer Equipment: Engineer, technical sales Transfer: Not assessed   Therapy/Group: Individual Therapy  Barrie Sigmund R Howerton-Davis 03/12/2020, 8:03 AM

## 2020-03-12 NOTE — Progress Notes (Signed)
Physical Therapy Session Note  Patient Details  Name: Kevin [name] MRN: [id number] Date of Birth: 17-Mar-1951  Today's Date: 03/12/2020 PT Individual Time: 1000-1055 PT Individual Time Calculation (min): 55 min   Short Term Goals: Week 1:  PT Short Term Goal 1 (Week 1): Patient to be able to perform bed to chair transfers with ModA PT Short Term Goal 2 (Week 1): Patient to be able to propel WC 50ft with ModA PT Short Term Goal 3 (Week 1): Patient to be able to maintain standing for 3-4 minutes in stedy without fatigue PT Short Term Goal 4 (Week 1): Patient to be able to gait train 57ft with maxA and WC follow  Skilled Therapeutic Interventions/Progress Updates:   Received pt supine in bed asleep. Upon wakening pt lethargic and reported he did not sleep well last night and wanted to rest after OT session this morning. When PT suggested OOB mobility and going to gym pt declined; however was agreeable to bed level exercises. Pt frequently closing eyes throughout session due to fatigue. Session with emphasis on bed mobility, LE strengthening, and improved activity tolerance. O2 sat 95% on RA at rest and pt denied any SOB. Pt performed the following exercises supine in bed with supervision and verbal cues for technique: -heel slides 2x12 bilaterally -hip adduction pillow squeezes 2x10 -SLR x12 bilaterally  -bridges 2x10 -clamshells 2x12 bilaterally -reverse clamshells 2x10 bilaterally  -sidelying hip abduction 2x10 bilaterally (pt with decreased glute activation and compensating with hip flexors) -marching 2x10 bilaterally -ankle circles x20 clockwise and counterclockwise bilaterally (Pt with increased difficulty and decreased ROM on R ankle > L) Pt rolled to L and R with supervision and use of bedrails to perform some exercises listed above. Pt reported mild SOB with exercises and required multiple rest breaks throughout session. O2 sat 96% on RA. Pt required verbal cues to avoid valsalva  during exercises. Pt scooted to [facility] with L UE to pull on headboard with supervision and cues for technique. Concluded session with pt supine in bed, needs within reach, and bed alarm on. Therapist assisted with repositioning of hemiplegic arm for support.   Therapy Documentation Precautions:  Precautions Precautions: Fall Precaution Comments: fatigues VERY easily, need to watch O2 sats, R hemiplegia and neglect, impaired cognition Restrictions Weight Bearing Restrictions: No  Therapy/Group: Individual Therapy Martin Majestic PT, DPT   03/12/2020, 7:27 AM

## 2020-03-12 NOTE — Progress Notes (Signed)
[place] PHYSICAL MEDICINE & REHABILITATION PROGRESS NOTE  Subjective/Complaints: Wants to go home. Advised of DC date of July 6th.  Wife at bedside asks about his progress and I reviewed therapy notes with her.   ROS: Denies CP, N/V  Objective: Vital Signs: Blood pressure 107/79, pulse 66, temperature 98.3 F (36.8 C), resp. rate 18, height 6' (1.829 m), weight 99.8 kg, SpO2 94 %. No results found. Recent Labs    03/10/20 0447  WBC 7.6  HGB 15.9  HCT 46.9  PLT 77*   Recent Labs    03/10/20 0447  NA 140  K 3.8  CL 108  CO2 23  GLUCOSE 119*  BUN 21  CREATININE 1.64*  CALCIUM 8.8*    Physical Exam: BP 107/79 (BP Location: Left Arm)   Pulse 66   Temp 98.3 F (36.8 C)   Resp 18   Ht 6' (1.829 m)   Wt 99.8 kg   SpO2 94%   BMI 29.84 kg/m  General: Alert and oriented x 3, No apparent distress HEENT: Head is normocephalic, atraumatic, PERRLA, EOMI, sclera anicteric, oral mucosa pink and moist, dentition intact, ext ear canals clear,  Neck: Supple without JVD or lymphadenopathy Heart: Reg rate and rhythm. No murmurs rubs or gallops Chest: CTA bilaterally without wheezes, rales, or rhonchi; no distress Abdomen: Soft, non-tender, non-distended, bowel sounds positive. Extremities: No clubbing, cyanosis, or edema. Pulses are 2+ Psych: Slowed, somewhat confused. Frustrated during SLP session Musc: No edema in extremities.  No tenderness in extremities. Neuro: Alert Right facial weakness Dysarthria, stable Motor: LUE/LLE: 5/5 proximal to distal RUE: Shoulder abduction 3+/5, distally 3/5 with apraxia RLE: 4 -/5 proximal to distal with apraxia.  Assessment/Plan: 1. Functional deficits secondary to left MCA/ACA infarcts which require 3+ hours per day of interdisciplinary therapy in a comprehensive inpatient rehab setting.  Physiatrist is providing close team supervision and 24 hour management of active medical problems listed below.  Physiatrist and rehab team  continue to assess barriers to discharge/monitor patient progress toward functional and medical goals  Care Tool:  Bathing    Body parts bathed by patient: Right arm, Left arm, Chest, Abdomen, Front perineal area, Left upper leg, Face, Right upper leg, Buttocks, Right lower leg, Left lower leg   Body parts bathed by helper: Buttocks Body parts n/a: Right lower leg, Left lower leg   Bathing assist Assist Level: Moderate Assistance - Patient 50 - 74%     Upper Body Dressing/Undressing Upper body dressing Upper body dressing/undressing activity did not occur (including orthotics): N/A What is the patient wearing?: Pull over shirt    Upper body assist Assist Level: Moderate Assistance - Patient 50 - 74%    Lower Body Dressing/Undressing Lower body dressing    Lower body dressing activity did not occur: N/A What is the patient wearing?: Pants, Underwear/pull up     Lower body assist Assist for lower body dressing: Moderate Assistance - Patient 50 - 74%     Toileting Toileting Toileting Activity did not occur Press photographer and hygiene only): N/A (no void or bm)  Toileting assist Assist for toileting: Maximal Assistance - Patient 25 - 49%     Transfers Chair/bed transfer  Transfers assist     Chair/bed transfer assist level: Contact Guard/Touching assist     Locomotion Ambulation   Ambulation assist      Assist level: 2 helpers Assistive device: Hand held assist Max distance: 65   Walk 10 feet activity   Assist  Walk  10 feet activity did not occur: Safety/medical concerns (fatigue)  Assist level: Moderate Assistance - Patient - 50 - 74% Assistive device: Hand held assist   Walk 50 feet activity   Assist Walk 50 feet with 2 turns activity did not occur: Safety/medical concerns (fatigue)         Walk 150 feet activity   Assist Walk 150 feet activity did not occur: Safety/medical concerns (fatigue)         Walk 10 feet on uneven surface   activity   Assist Walk 10 feet on uneven surfaces activity did not occur: Safety/medical concerns (fatigue)         Wheelchair     Assist Will patient use wheelchair at discharge?: Yes Type of Wheelchair: Manual    Wheelchair assist level: Dependent - Patient 0%      Wheelchair 50 feet with 2 turns activity    Assist        Assist Level: Dependent - Patient 0%   Wheelchair 150 feet activity     Assist     Assist Level: Dependent - Patient 0%      Medical Problem List and Plan: 1. Right sided weakness with poor safety awareness and decrease in problem solving affecting mobility and ADLs secondary to Left ACA/MCA watershed infarct, small left basal ganglia and thalamic infarct, left parietal occipital infarct on 03/02/20.  Continue CIR 2. Antithrombotics: -DVT/anticoagulation:Pharmaceutical:Other (comment)--Eliquis. -antiplatelet therapy: N/A 3. Pain Management:N/A 4. Mood:LCSW to follow for evaluation and support. -antipsychotic agents: N/a 5. Neuropsych: This patientisnot fully capable of making decisions on hisown behalf. 6. Skin/Wound Care:Routine pressure relief measures. 7. Fluids/Electrolytes/Nutrition:Monitor I/Os 8. HTN: Monitor BP   Lasix/Entresto/aldactone resumed 06/14.   6/20: labile  Monitor with increased mobility 9. Chronic systolic CHF: Monitor Monitor for signs of overload. Continue Coreg bid, Lasix, Entresto,aldactone and Lipitor.   No ASA due to low platelets.    Filed Weights   03/07/20 0537 03/08/20 0348 03/09/20 0217  Weight: 101.9 kg 100.4 kg 99.8 kg   Improved on 6/17 10. Cirrhosis of the liver with chronic thrombocytopenia: Monitor for signs of bleeding or decompensation.   LFTs WNL on 6/15  Plts 77 on 6/18, labs ordered for Monday 11. Chronic SOB: Multifactorial--? OSA. On Trilegy for reactive airway disease. Duo nebs prn. Encourage IS.   Monitor with increased exertion  Stable  6/20. 12. Polysubstance abuse + Cocaine and THC on admit : Has been ongoing since 2015. Educate/encourage patient regarding life style changes.  13. Seizure disorder: Continue Keppra bid.  14. Impaired fasting glucose/Hyperglycemia: Hgb A1c- 5.2.   Blood glucose elevated on 6/18  Monitor with increased mobility 15.?  AKI on CKD stage III:   Cr.  1.64 on 6/18, labs ordered for Monday  Encourage fluids  Cont to monitor 16. Constipation  Bowel meds increased on 6/15, again on 6/16, decreased on 6/18, stopped senna on 6/19 since still having loose stool. 17.  Macrocytosis  Vitamin B12/folate ordered for Monday  LOS: 6 days A FACE TO FACE EVALUATION WAS PERFORMED  Clide Deutscher Azariah Bonura 03/12/2020, 4:28 PM

## 2020-03-12 NOTE — Procedures (Signed)
Patient declined CPAP for tonight.  

## 2020-03-12 NOTE — Plan of Care (Signed)
Problem: RH BOWEL ELIMINATION Goal: RH STG MANAGE BOWEL WITH ASSISTANCE Description: STG Manage Bowel with min Assistance. Outcome: Progressing Flowsheets (Taken 03/12/2020 1715) STG: Pt will manage bowels with assistance: 6-Modified independent   Problem: RH BLADDER ELIMINATION Goal: RH STG MANAGE BLADDER WITH ASSISTANCE Description: STG Manage Bladder With min Assistance Outcome: Progressing Flowsheets (Taken 03/12/2020 1715) STG: Pt will manage bladder with assistance: 6-Modified independent Note: Current urinary difficulty with Q8H Bladder scans and straight caths.   Problem: RH SAFETY Goal: RH STG ADHERE TO SAFETY PRECAUTIONS W/ASSISTANCE/DEVICE Description: STG Adhere to Safety Precautions With cues and reminders. Outcome: Progressing Flowsheets (Taken 03/12/2020 1715) STG:Pt will adhere to safety precautions with assistance/device: 6-Modified independent   Problem: RH PAIN MANAGEMENT Goal: RH STG PAIN MANAGED AT OR BELOW PT'S PAIN GOAL Description: Pain level less than 4 on scale of 0-10 Outcome: Progressing Note: Patient reports minimal to no pain reported at this time.   Problem: RH KNOWLEDGE DEFICIT Goal: RH STG INCREASE KNOWLEDGE OF STROKE PROPHYLAXIS Description: Pt will be able to adhere to medication regimen, dietary restriction and lifestyle modifications to prevent stroke and further complications with min assist from family upon discharge.  Outcome: Progressing Note: Teaching reinforced including current medication regimen with Apixaban

## 2020-03-13 ENCOUNTER — Encounter (HOSPITAL_COMMUNITY): Payer: Medicare HMO | Admitting: Psychology

## 2020-03-13 ENCOUNTER — Inpatient Hospital Stay (HOSPITAL_COMMUNITY): Payer: Medicare HMO | Admitting: Speech Pathology

## 2020-03-13 ENCOUNTER — Inpatient Hospital Stay (HOSPITAL_COMMUNITY): Payer: Medicare HMO | Admitting: Occupational Therapy

## 2020-03-13 ENCOUNTER — Inpatient Hospital Stay (HOSPITAL_COMMUNITY): Payer: Medicare HMO | Admitting: Physical Therapy

## 2020-03-13 DIAGNOSIS — F1910 Other psychoactive substance abuse, uncomplicated: Secondary | ICD-10-CM

## 2020-03-13 DIAGNOSIS — R0989 Other specified symptoms and signs involving the circulatory and respiratory systems: Secondary | ICD-10-CM

## 2020-03-13 LAB — CBC WITH DIFFERENTIAL/PLATELET
Abs Immature Granulocytes: 0.01 10*3/uL (ref 0.00–0.07)
Basophils Absolute: 0 10*3/uL (ref 0.0–0.1)
Basophils Relative: 0 %
Eosinophils Absolute: 0.1 10*3/uL (ref 0.0–0.5)
Eosinophils Relative: 2 %
HCT: 42.2 % (ref 39.0–52.0)
Hemoglobin: 14.1 g/dL (ref 13.0–17.0)
Immature Granulocytes: 0 %
Lymphocytes Relative: 5 %
Lymphs Abs: 0.3 10*3/uL — ABNORMAL LOW (ref 0.7–4.0)
MCH: 34.2 pg — ABNORMAL HIGH (ref 26.0–34.0)
MCHC: 33.4 g/dL (ref 30.0–36.0)
MCV: 102.4 fL — ABNORMAL HIGH (ref 80.0–100.0)
Monocytes Absolute: 0.7 10*3/uL (ref 0.1–1.0)
Monocytes Relative: 11 %
Neutro Abs: 5.1 10*3/uL (ref 1.7–7.7)
Neutrophils Relative %: 82 %
Platelets: 68 10*3/uL — ABNORMAL LOW (ref 150–400)
RBC: 4.12 MIL/uL — ABNORMAL LOW (ref 4.22–5.81)
RDW: 11.6 % (ref 11.5–15.5)
WBC: 6.2 10*3/uL (ref 4.0–10.5)
nRBC: 0 % (ref 0.0–0.2)

## 2020-03-13 LAB — BASIC METABOLIC PANEL
Anion gap: 9 (ref 5–15)
BUN: 21 mg/dL (ref 8–23)
CO2: 22 mmol/L (ref 22–32)
Calcium: 8.6 mg/dL — ABNORMAL LOW (ref 8.9–10.3)
Chloride: 109 mmol/L (ref 98–111)
Creatinine, Ser: 1.51 mg/dL — ABNORMAL HIGH (ref 0.61–1.24)
GFR calc Af Amer: 54 mL/min — ABNORMAL LOW (ref >60)
GFR calc non Af Amer: 47 mL/min — ABNORMAL LOW (ref >60)
Glucose, Bld: 121 mg/dL — ABNORMAL HIGH (ref 70–99)
Potassium: 3.9 mmol/L (ref 3.5–5.1)
Sodium: 140 mmol/L (ref 135–145)

## 2020-03-13 LAB — VITAMIN B12: Vitamin B-12: 460 pg/mL (ref 180–914)

## 2020-03-13 MED ORDER — BETHANECHOL CHLORIDE 25 MG PO TABS
25.0000 mg | ORAL_TABLET | Freq: Four times a day (QID) | ORAL | Status: DC
  Administered 2020-03-13 – 2020-03-28 (×59): 25 mg via ORAL
  Filled 2020-03-13 (×59): qty 1

## 2020-03-13 NOTE — Progress Notes (Signed)
Physical Therapy Session Note  Patient Details  Name: Kevin [name] MRN: [id number] Date of Birth: Jun 25, 1951  Today's Date: 03/13/2020 PT Individual Time: 1105-1201 PT Individual Time Calculation (min): 56 min   Short Term Goals: Week 1:  PT Short Term Goal 1 (Week 1): Patient to be able to perform bed to chair transfers with ModA PT Short Term Goal 2 (Week 1): Patient to be able to propel WC 67f with ModA PT Short Term Goal 3 (Week 1): Patient to be able to maintain standing for 3-4 minutes in stedy without fatigue PT Short Term Goal 4 (Week 1): Patient to be able to gait train 572fwith maxA and WC follow  Skilled Therapeutic Interventions/Progress Updates:    [facility] willing to participate in session but remaining fairly confused today. Spent quite a bit of time on gait training and able to giat train [id number] RW/R hand orthosis and MinA mostly for balance and control of RW. Tried out multiple AFOs and finally found successful model that fit well and consistently assisted with toe clearance- left in room for now. Otherwise worked on dynavision attempting to have him hit red lights with right hand/green lights with left hand but had difficulty in remembering exact sequencing and needed Mod cues to remember which had to use. Easily fatigued. Generally able to perform functional transfers with S-min guard [facility] cues for sequencing and safety. [facility] needs met, seatbelt alarm active this morning.   Therapy Documentation Precautions:  Precautions Precautions: Fall Precaution Comments: fatigues VERY easily, need to watch O2 sats, R hemiplegia and neglect, impaired cognition Restrictions Weight Bearing Restrictions: No Pain: Pain Assessment Pain Scale: 0-10 Pain Score: 0-No pain    Therapy/Group: Individual Therapy  KristWindell Norfolk, PN1   Supplemental Physical Therapist [facility]  [phone] Rehab Office [phone]/2021, 12:38 PM

## 2020-03-13 NOTE — Progress Notes (Signed)
[place] PHYSICAL MEDICINE & REHABILITATION PROGRESS NOTE  Subjective/Complaints: Patient seen sitting up in bed this AM.  He states he slept well overnight.  He states he had a good weekend.  ROS: Denies CP, SOB, N/V/D  Objective: Vital Signs: Blood pressure 97/67, pulse 80, temperature 97.9 F (36.6 C), resp. rate 17, height 6' (1.829 m), weight 93.6 kg, SpO2 91 %. No results found. No results for input(s): WBC, HGB, HCT, PLT in the last 72 hours. Recent Labs    03/13/20 0608  NA 140  K 3.9  CL 109  CO2 22  GLUCOSE 121*  BUN 21  CREATININE 1.51*  CALCIUM 8.6*    Physical Exam: BP 97/67 (BP Location: Left Arm)   Pulse 80   Temp 97.9 F (36.6 C)   Resp 17   Ht 6' (1.829 m)   Wt 93.6 kg   SpO2 91%   BMI 27.99 kg/m  Constitutional: No distress . Vital signs reviewed. HENT: Normocephalic.  Atraumatic. Eyes: EOMI. No discharge. Cardiovascular: No JVD. Respiratory: Normal effort.  No stridor. GI: Non-distended. Skin: Warm and dry.  Intact. Psych: Slowed, confused.  Musc: No edema in extremities.  No tenderness in extremities. Neuro: Alert Right facial weakness, unchanged Dysarthria, unchanged Motor:  RUE: Shoulder abduction 4/5, distally 4-/5 with apraxia RLE: 4-/5 proximal to distal with apraxia. Right inattention improving  Assessment/Plan: 1. Functional deficits secondary to left MCA/ACA infarcts which require 3+ hours per day of interdisciplinary therapy in a comprehensive inpatient rehab setting.  Physiatrist is providing close team supervision and 24 hour management of active medical problems listed below.  Physiatrist and rehab team continue to assess barriers to discharge/monitor patient progress toward functional and medical goals  Care Tool:  Bathing    Body parts bathed by patient: Right arm, Left arm, Chest, Abdomen, Front perineal area, Left upper leg, Face, Right upper leg, Buttocks, Right lower leg, Left lower leg   Body parts bathed by  helper: Buttocks Body parts n/a: Right lower leg, Left lower leg   Bathing assist Assist Level: Moderate Assistance - Patient 50 - 74%     Upper Body Dressing/Undressing Upper body dressing Upper body dressing/undressing activity did not occur (including orthotics): N/A What is the patient wearing?: Pull over shirt    Upper body assist Assist Level: Moderate Assistance - Patient 50 - 74%    Lower Body Dressing/Undressing Lower body dressing    Lower body dressing activity did not occur: N/A What is the patient wearing?: Pants, Underwear/pull up     Lower body assist Assist for lower body dressing: Moderate Assistance - Patient 50 - 74%     Toileting Toileting Toileting Activity did not occur Press photographer and hygiene only): N/A (no void or bm)  Toileting assist Assist for toileting: Maximal Assistance - Patient 25 - 49%     Transfers Chair/bed transfer  Transfers assist     Chair/bed transfer assist level: Contact Guard/Touching assist     Locomotion Ambulation   Ambulation assist      Assist level: 2 helpers Assistive device: Hand held assist Max distance: 65   Walk 10 feet activity   Assist  Walk 10 feet activity did not occur: Safety/medical concerns (fatigue)  Assist level: Moderate Assistance - Patient - 50 - 74% Assistive device: Hand held assist   Walk 50 feet activity   Assist Walk 50 feet with 2 turns activity did not occur: Safety/medical concerns (fatigue)         Walk  150 feet activity   Assist Walk 150 feet activity did not occur: Safety/medical concerns (fatigue)         Walk 10 feet on uneven surface  activity   Assist Walk 10 feet on uneven surfaces activity did not occur: Safety/medical concerns (fatigue)         Wheelchair     Assist Will patient use wheelchair at discharge?: Yes Type of Wheelchair: Manual    Wheelchair assist level: Dependent - Patient 0%      Wheelchair 50 feet with 2 turns  activity    Assist        Assist Level: Dependent - Patient 0%   Wheelchair 150 feet activity     Assist     Assist Level: Dependent - Patient 0%      Medical Problem List and Plan: 1. Right sided weakness with poor safety awareness and decrease in problem solving affecting mobility and ADLs secondary to Left ACA/MCA watershed infarct, small left basal ganglia and thalamic infarct, left parietal occipital infarct on 03/02/20.  Continue CIR 2. Antithrombotics: -DVT/anticoagulation:Pharmaceutical:Other (comment)--Eliquis. -antiplatelet therapy: N/A 3. Pain Management:N/A 4. Mood:LCSW to follow for evaluation and support. -antipsychotic agents: N/a 5. Neuropsych: This patientisnot fully capable of making decisions on hisown behalf. 6. Skin/Wound Care:Routine pressure relief measures. 7. Fluids/Electrolytes/Nutrition:Monitor I/Os 8. HTN: Monitor BP   Lasix/Entresto/aldactone resumed 06/14.   Labile on 6/21, avoid hypoperfusion, will consider med adjustments tomorrow if persistently low  Monitor with increased mobility 9. Chronic systolic CHF: Monitor Monitor for signs of overload. Continue Coreg bid, Lasix, Entresto,aldactone and Lipitor.   No ASA due to low platelets.    Filed Weights   03/08/20 0348 03/09/20 0217 03/13/20 0231  Weight: 100.4 kg 99.8 kg 93.6 kg   ?Relatibility on 6/21 10. Cirrhosis of the liver with chronic thrombocytopenia: Monitor for signs of bleeding or decompensation.   LFTs WNL on 6/15  Plts 77 on 6/18, labs ordered for tomorrow 11. Chronic SOB: Multifactorial--? OSA. On Trilegy for reactive airway disease. Duo nebs prn. Encourage IS.   Monitor with increased exertion  Stable 6/20. 12. Polysubstance abuse + Cocaine and THC on admit : Has been ongoing since 2015. Educate/encourage patient regarding life style changes.  13. Seizure disorder: Continue Keppra bid.  14. Impaired fasting glucose/Hyperglycemia:  Hgb A1c- 5.2.   Blood glucose elevated on 6/18  Monitor with increased mobility 15.?  AKI on CKD stage III:   Cr.  1.51 on 6/21  Encourage fluids  Cont to monitor 16. Constipation  Bowel meds increased on 6/15, again on 6/16, decreased on 6/18, decreased again on 6/19 17.  Macrocytosis  Vitamin B12 within normal limits, folate pending  LOS: 7 days A FACE TO FACE EVALUATION WAS PERFORMED  Orange Hilligoss Lorie Phenix 03/13/2020, 11:01 AM

## 2020-03-13 NOTE — Consult Note (Signed)
Neuropsychological Consultation   Patient:   Kevin [name]   DOB:   [birth year]  MR Number:  [id number]  Location:  [place] [id number] [place] [address] Dept: [place]: [phone]           Date of Service:   03/13/2020  Start Time:   10 AM End Time:   11 AM  Provider/Observer:  Ilean Skill, Psy.D.       Clinical Neuropsychologist       Billing Code/Service: 84166  Chief Complaint:    Alain Marion. Oberman is a 69 year old male with a history of hypertension, CAD with chronic stenosis CHF, chronic kidney disease, cirrhosis of the liver with chronic thrombocytopenia, polysubstance abuse, multifactorial shortness of breath, CAF, prior CVA/TIA, history of seizures.  The patient was admitted on 03/02/2020 after fall off of his bed with slurred speech and noted to have right-sided weakness at admission.  CT head was negative for bleed.  MRI brain done revealed numerous acute cortical/subcortical embolic infarcts in left ACA, MCA, PCA and watershed territories as well as multiple small acute infarcts in left basal ganglia and left thalamus with moderate generalized atrophy.  There was no identified EEG abnormalities or seizure activity.  Urine drug screen was positive for cocaine and THC.  The patient has ongoing left-sided weakness with poor safety awareness and decreased executive functioning and difficulties with ADLs.  Reason for Service:  The patient was referred for neuropsychological consultation due to residual effects of his most recent strokes as well as likely residual effects of his prior CVAs and history of polysubstance use.  Below is the HPI for the current admission.  AYT:KZSWFU T. Membreno is a 69 year old LH-male with history HTN, CAD w/chronic systolic CHF, CKD- SCr 1.9?, cirrhosis of the liver with chronic thrombocytopenia, polysubstance abuse, multifactorial SOB,  CAF - on Eliquis, prior CVA/TIA, h/o seizures who was admitted on 03/02/20 after fall off the bed with slurred speech and noted to have right sided weakness at admission. CT head negative for bleed. CTA head/CT perfusion negative for LVO or perfusion deficits. He was loaded with Keppra due to concerns of Todd's paralysis. RI brain done revealing numerous acute cortical/subcortical embolic infarcts in Left ACA, MCA, PCA and watershed territories as well as multiple small acute infarcts in left basal ganglia and left thalamus with moderate generalized atrophy.   2D echo showed EF 60-65% with severe concentric LVH and normal LVH. EEG with mild slowing and no seizure activity. UDS positive for cocaine and THC. Dr. Leonie Man recommended contiuing Eliquis as well as risk factor modification. Patient continues to be limited by right sided weakness with poor safety awareness and decrease in problem solving affecting mobility and ADLs. CIR recommended due to functional decline.  Current Status:  The patient was sitting up in his wheelchair although not completely alert initially.  The patient showed significant ongoing issues with mental status and cognition.  He was unable to accurately identify the year or month season etc. and could not accurately identify the president or previous Presidents in any type of accurate or linear fashion.  The patient also was unable to describe what it happened to him and appeared very confused when the question was asked.  After explaining what it happened including his prior recent use of cocaine the patient admitted to frequent use of cocaine and other substances.  The patient acknowledged being previously told that cocaine  use was very dangerous relative to stroke risk.  The patient showed significant motor deficits particularly right-sided motor deficits.  The patient was able to provide some historical information but clearly is having significant ongoing memory difficulties and  difficulties with executive functioning.  The patient is having difficulty remembering or comprehending new learning and mental status continues to be significantly impaired for memory, visual-spatial abilities, attention and concentration, and executive functioning.  It is difficult to ascertain how much of this is due to his most recent effect versus prior deficits although it is clear that there are significant new deficits with this most recent stroke event.  Behavioral Observation: TATEN MERROW  presents as a 69 y.o.-year-old Left African American Male who appeared his stated age. his dress was Appropriate and he was Well Groomed and his manners were Appropriate to the situation.  his participation was indicative of Appropriate and Inattentive behaviors.  There were any physical disabilities noted.  he displayed an appropriate level of cooperation and motivation.     Interactions:    Active Inattentive and With clear ongoing cognitive deficits.  Attention:   abnormal and attention span appeared shorter than expected for age  Memory:   abnormal; global memory impairment noted  Visuo-spatial:  not examined although there are likely some significant visual-spatial deficits and information processing speed deficits.  Speech (Volume):  low  Speech:   non-fluent aphasia; and word finding deficits  Thought Process:  Disorganized  Though Content:  WNL; not suicidal and not homicidal  Orientation:   person and place  Judgment:   Poor  Planning:   Poor  Affect:    Flat and Lethargic  Mood:    Dysphoric  Insight:   Shallow  Intelligence:   low  Substance Use:  There is a documented history of cocaine and marijuana abuse confirmed by the patient and Available medical records.    Medical History:   Past Medical History:  Diagnosis Date  . Atrial fibrillation, permanent (HCC) 02/01/2016  . Blood transfusion without reported diagnosis   . CAD in native artery cath 2016 90% mRamus   02/01/2016  . CHF (congestive heart failure) (HCC)   . Cholelithiases 05/01/2012  . Claudication (HCC)    bilateral  . Cocaine abuse (HCC)   . Dysrhythmia    afib  . ETOH abuse   . Hepatitis C antibody test positive 04/2012  . Hyperpigmentation   . Hypertension   . PAD (peripheral artery disease) (HCC)   . Persistent atrial fibrillation (HCC) 08/23/2014  . Thrombocytopenia (HCC)   . Tobacco abuse     Psychiatric History:  The patient has a prior history of cocaine, ethanol and marijuana abuse as well as tobacco abuse  Family Med/Psych History:  Family History  Problem Relation Age of Onset  . Seizures Daughter   . Cancer Mother    Impression/DX:  Anant Agard. Cueva is a 69 year old male with a history of hypertension, CAD with chronic stenosis CHF, chronic kidney disease, cirrhosis of the liver with chronic thrombocytopenia, polysubstance abuse, multifactorial shortness of breath, CAF, prior CVA/TIA, history of seizures.  The patient was admitted on 03/02/2020 after fall off of his bed with slurred speech and noted to have right-sided weakness at admission.  CT head was negative for bleed.  MRI brain done revealed numerous acute cortical/subcortical embolic infarcts in left ACA, MCA, PCA and watershed territories as well as multiple small acute infarcts in left basal ganglia and left thalamus with moderate generalized atrophy.  There was no identified EEG abnormalities or seizure activity.  Urine drug screen was positive for cocaine and THC.  The patient has ongoing left-sided weakness with poor safety awareness and decreased executive functioning and difficulties with ADLs.  The patient was sitting up in his wheelchair although not completely alert initially.  The patient showed significant ongoing issues with mental status and cognition.  He was unable to accurately identify the year or month season etc. and could not accurately identify the president or previous Presidents in any type of  accurate or linear fashion.  The patient also was unable to describe what it happened to him and appeared very confused when the question was asked.  After explaining what it happened including his prior recent use of cocaine the patient admitted to frequent use of cocaine and other substances.  The patient acknowledged being previously told that cocaine use was very dangerous relative to stroke risk.  The patient showed significant motor deficits particularly right-sided motor deficits.  The patient was able to provide some historical information but clearly is having significant ongoing memory difficulties and difficulties with executive functioning.  The patient is having difficulty remembering or comprehending new learning and mental status continues to be significantly impaired for memory, visual-spatial abilities, attention and concentration, and executive functioning.  It is difficult to ascertain how much of this is due to his most recent effect versus prior deficits although it is clear that there are significant new deficits with this most recent stroke event.   Diagnosis:    CVA         Electronically Signed   _______________________ Arley Phenix, Psy.D.

## 2020-03-13 NOTE — Progress Notes (Addendum)
Occupational Therapy Session Note  Patient Details  Name: Kevin [name] MRN: [id number] Date of Birth: Jan 27, 1951  Today's Date: 03/13/2020 OT Individual Time: 0037-0488 OT Individual Time Calculation (min): 44 min    Short Term Goals: Week 1:  OT Short Term Goal 1 (Week 1): Pt will donn/doff shirt with min assist OT Short Term Goal 2 (Week 1): Pt will bathe LB with mod assist OT Short Term Goal 3 (Week 1): Pt will complete toilet transfer using BSC with min assist OT Short Term Goal 4 (Week 1): Pt will complete LB dressing with mod assist.  Skilled Therapeutic Interventions/Progress Updates:    Pt supine in bed with no complaints of pain.  Reports he does not wish to bathe this morning but would like to change into clean clothes.  Instructed pt through gentle AROM BUE and BLE to promote right sided awareness prior to OOB.  Pt rolled to right with min VCs and pushed to sitting with mod assist.  Pt completed sit to stand and walked to sink with w/c follow and min assist for RW mgt.  Pt requesting to brush teeth in standing.  Pt completed oral hygiene needing CGA and intermittent VCs to correct posture due to mild right sided leaning and decreased weightbearing through RLE with multitasking.  Pt completed stand to sit at w/c with min assist and VCs to slow descent.  Pt doffed/donned shirt overhead with supervision to attend to pulling right side down fully, and donned shorts with CGA sit<>stand to pull over hips.  Call bell in reach, seatbelt alarm on. Pt exhibited improved attention to RUE and increased activity tolerance today.  Therapy Documentation Precautions:  Precautions Precautions: Fall Precaution Comments: fatigues VERY easily, need to watch O2 sats, R hemiplegia and neglect, impaired cognition Restrictions Weight Bearing Restrictions: No General:       Therapy/Group: Individual Therapy  Amie Critchley 03/13/2020, 8:27 AM

## 2020-03-13 NOTE — Progress Notes (Signed)
Speech Language Pathology Daily Session Note  Patient Details  Name: Kevin [name] MRN: [id number] Date of Birth: 1950-11-20  Today's Date: 03/13/2020 SLP Individual Time: 1430-1500 SLP Individual Time Calculation (min): 30 min  Short Term Goals: Week 1: SLP Short Term Goal 1 (Week 1): Pt will sustain attention to functional tasks for 5 minute intervals with Max A cues for redirection. SLP Short Term Goal 2 (Week 1): Pt will demonsrate ability to problem solve in basic familiar functional situations with Max A verbal/visual cues. SLP Short Term Goal 3 (Week 1): Pt will identify 1 physical and 1 cognitive impairment with Max A verbal/visual cues. SLP Short Term Goal 4 (Week 1): Pt will recall new and/or daily information with Max A verbal/visual cues for use of aids or other compensatory memory strategies. SLP Short Term Goal 5 (Week 1): Pt will follow 2-step directions with 50% accuracy provided Max A verbal and visual cues.  Skilled Therapeutic Interventions: Pt was seen for skilled ST targeting cognition. Upon arrival, pt expressed need to have a bowel movement. Pt required Mod A verbal and visual cues to follow directions for sequencing, sustained attention, and initiation during functional transfers via stedy, toileting, and bed mobility (RN also present to assist with safe transfers/mobility). Pt verbally recalled general events of all therapy sessions with Min A question cues and use of therapy schedule as external aid. Pt used calendar posted in room to orient to date with Supervision A. Pt independently oriented to self, place, and situation. Although reason unclear, pt became somewhat verbally agitated at end of session, stating "get out, get out" (per last ST note, it appears he becomes frustrated when sessions are ending). Pt left laying in bed with needs met, call bell in hand, bed alarm set. Continue per current plan of care.         Pain Pain Assessment Pain Scale: 0-10 Pain  Score: 0-No pain  Therapy/Group: Individual Therapy  Arbutus Leas 03/13/2020, 7:20 AM

## 2020-03-13 NOTE — Plan of Care (Signed)
  Problem: Consults Goal: RH STROKE PATIENT EDUCATION Description: See Patient Education module for education specifics  Outcome: Progressing   Problem: RH BOWEL ELIMINATION Goal: RH STG MANAGE BOWEL WITH ASSISTANCE Description: STG Manage Bowel with min Assistance. Outcome: Progressing   Problem: RH BLADDER ELIMINATION Goal: RH STG MANAGE BLADDER WITH ASSISTANCE Description: STG Manage Bladder With min Assistance Outcome: Progressing   Problem: RH SAFETY Goal: RH STG ADHERE TO SAFETY PRECAUTIONS W/ASSISTANCE/DEVICE Description: STG Adhere to Safety Precautions With cues and reminders. Outcome: Progressing   Problem: RH COGNITION-NURSING Goal: RH STG USES MEMORY AIDS/STRATEGIES W/ASSIST TO PROBLEM SOLVE Description: STG Uses Memory Aids/Strategies With supervision Assistance to Problem Solve. Outcome: Progressing   Problem: RH PAIN MANAGEMENT Goal: RH STG PAIN MANAGED AT OR BELOW PT'S PAIN GOAL Description: Pain level less than 4 on scale of 0-10 Outcome: Progressing   Problem: RH KNOWLEDGE DEFICIT Goal: RH STG INCREASE KNOWLEDGE OF HYPERTENSION Description: Pt will be able to adhere to medication regimen, dietary restriction and lifestyle modifications to better control hypertension and further complications with min assist from family upon discharge.  Outcome: Progressing Goal: RH STG INCREASE KNOWLEGDE OF HYPERLIPIDEMIA Description: Pt will be able to adhere to medication regimen, dietary restriction and lifestyle modifications to prevent high cholesterol and further complications with min assist from family upon discharge.  Outcome: Progressing Goal: RH STG INCREASE KNOWLEDGE OF STROKE PROPHYLAXIS Description: Pt will be able to adhere to medication regimen, dietary restriction and lifestyle modifications to prevent stroke and further complications with min assist from family upon discharge.  Outcome: Progressing   

## 2020-03-14 ENCOUNTER — Inpatient Hospital Stay (HOSPITAL_COMMUNITY): Payer: Medicare HMO | Admitting: Physical Therapy

## 2020-03-14 ENCOUNTER — Inpatient Hospital Stay (HOSPITAL_COMMUNITY): Payer: Medicare HMO | Admitting: Speech Pathology

## 2020-03-14 ENCOUNTER — Inpatient Hospital Stay (HOSPITAL_COMMUNITY): Payer: Medicare HMO | Admitting: Occupational Therapy

## 2020-03-14 LAB — CBC WITH DIFFERENTIAL/PLATELET
Abs Immature Granulocytes: 0.01 10*3/uL (ref 0.00–0.07)
Basophils Absolute: 0 10*3/uL (ref 0.0–0.1)
Basophils Relative: 1 %
Eosinophils Absolute: 0.2 10*3/uL (ref 0.0–0.5)
Eosinophils Relative: 3 %
HCT: 40.2 % (ref 39.0–52.0)
Hemoglobin: 13.3 g/dL (ref 13.0–17.0)
Immature Granulocytes: 0 %
Lymphocytes Relative: 5 %
Lymphs Abs: 0.3 10*3/uL — ABNORMAL LOW (ref 0.7–4.0)
MCH: 33.8 pg (ref 26.0–34.0)
MCHC: 33.1 g/dL (ref 30.0–36.0)
MCV: 102.3 fL — ABNORMAL HIGH (ref 80.0–100.0)
Monocytes Absolute: 0.8 10*3/uL (ref 0.1–1.0)
Monocytes Relative: 13 %
Neutro Abs: 4.7 10*3/uL (ref 1.7–7.7)
Neutrophils Relative %: 78 %
Platelets: 60 10*3/uL — ABNORMAL LOW (ref 150–400)
RBC: 3.93 MIL/uL — ABNORMAL LOW (ref 4.22–5.81)
RDW: 11.3 % — ABNORMAL LOW (ref 11.5–15.5)
WBC: 6 10*3/uL (ref 4.0–10.5)
nRBC: 0 % (ref 0.0–0.2)

## 2020-03-14 MED ORDER — CARVEDILOL 12.5 MG PO TABS
12.5000 mg | ORAL_TABLET | Freq: Two times a day (BID) | ORAL | Status: DC
  Administered 2020-03-14 – 2020-03-28 (×28): 12.5 mg via ORAL
  Filled 2020-03-14 (×29): qty 1

## 2020-03-14 NOTE — Plan of Care (Signed)
  Problem: Consults Goal: RH STROKE PATIENT EDUCATION Description: See Patient Education module for education specifics  Outcome: Progressing   Problem: RH BOWEL ELIMINATION Goal: RH STG MANAGE BOWEL WITH ASSISTANCE Description: STG Manage Bowel with min Assistance. Outcome: Progressing   Problem: RH BLADDER ELIMINATION Goal: RH STG MANAGE BLADDER WITH ASSISTANCE Description: STG Manage Bladder With min Assistance Outcome: Progressing   Problem: RH SAFETY Goal: RH STG ADHERE TO SAFETY PRECAUTIONS W/ASSISTANCE/DEVICE Description: STG Adhere to Safety Precautions With cues and reminders. Outcome: Progressing   Problem: RH COGNITION-NURSING Goal: RH STG USES MEMORY AIDS/STRATEGIES W/ASSIST TO PROBLEM SOLVE Description: STG Uses Memory Aids/Strategies With supervision Assistance to Problem Solve. Outcome: Progressing   Problem: RH PAIN MANAGEMENT Goal: RH STG PAIN MANAGED AT OR BELOW PT'S PAIN GOAL Description: Pain level less than 4 on scale of 0-10 Outcome: Progressing   Problem: RH KNOWLEDGE DEFICIT Goal: RH STG INCREASE KNOWLEDGE OF HYPERTENSION Description: Pt will be able to adhere to medication regimen, dietary restriction and lifestyle modifications to better control hypertension and further complications with min assist from family upon discharge.  Outcome: Progressing Goal: RH STG INCREASE KNOWLEGDE OF HYPERLIPIDEMIA Description: Pt will be able to adhere to medication regimen, dietary restriction and lifestyle modifications to prevent high cholesterol and further complications with min assist from family upon discharge.  Outcome: Progressing Goal: RH STG INCREASE KNOWLEDGE OF STROKE PROPHYLAXIS Description: Pt will be able to adhere to medication regimen, dietary restriction and lifestyle modifications to prevent stroke and further complications with min assist from family upon discharge.  Outcome: Progressing   

## 2020-03-14 NOTE — Progress Notes (Signed)
[facility] PHYSICAL MEDICINE & REHABILITATION PROGRESS NOTE  Subjective/Complaints: Patient seen laying in bed this morning.  He states he slept well overnight.  He notes improvement in strength.  ROS: Denies CP, SOB, N/V/D  Objective: Vital Signs: Blood pressure 134/72, pulse 67, temperature 97.9 F (36.6 C), resp. rate 17, height 6' (1.829 m), weight 81.4 kg, SpO2 94 %. No results found. Recent Labs    03/13/20 0608 03/14/20 0706  WBC 6.2 6.0  HGB 14.1 13.3  HCT 42.2 40.2  PLT 68* 60*   Recent Labs    03/13/20 0608  NA 140  K 3.9  CL 109  CO2 22  GLUCOSE 121*  BUN 21  CREATININE 1.51*  CALCIUM 8.6*    Physical Exam: BP 134/72 (BP Location: Left Arm)   Pulse 67   Temp 97.9 F (36.6 C)   Resp 17   Ht 6' (1.829 m)   Wt 81.4 kg   SpO2 94%   BMI 24.34 kg/m  Constitutional: No distress . Vital signs reviewed. HENT: Normocephalic.  Atraumatic. Eyes: EOMI. No discharge. Cardiovascular: No JVD. Respiratory: Normal effort.  No stridor. GI: Non-distended. Skin: Warm and dry.  Intact. Psych: Slowed.   Musc: No edema in extremities.  No tenderness in extremities. Neuro: Alert HOH Right facial weakness, unchanged Dysarthria, stable Motor:  RUE: Shoulder abduction 4/5, distally 4-/5 with apraxia, improving RLE: 4-/5 proximal to distal with apraxia. Right inattention improving  Assessment/Plan: 1. Functional deficits secondary to left MCA/ACA infarcts which require 3+ hours per day of interdisciplinary therapy in a comprehensive inpatient rehab setting.  Physiatrist is providing close team supervision and 24 hour management of active medical problems listed below.  Physiatrist and rehab team continue to assess barriers to discharge/monitor patient progress toward functional and medical goals  Care Tool:  Bathing    Body parts bathed by patient: Right arm, Left arm, Chest, Abdomen, Front perineal area, Left upper leg, Face, Right upper leg, Buttocks, Right  lower leg, Left lower leg   Body parts bathed by helper: Buttocks Body parts n/a: Right lower leg, Left lower leg   Bathing assist Assist Level: Moderate Assistance - Patient 50 - 74%     Upper Body Dressing/Undressing Upper body dressing Upper body dressing/undressing activity did not occur (including orthotics): N/A What is the patient wearing?: Pull over shirt    Upper body assist Assist Level: Supervision/Verbal cueing    Lower Body Dressing/Undressing Lower body dressing    Lower body dressing activity did not occur: N/A What is the patient wearing?: Pants     Lower body assist Assist for lower body dressing: Contact Guard/Touching assist     Toileting Toileting Toileting Activity did not occur (Clothing management and hygiene only): N/A (no void or bm)  Toileting assist Assist for toileting: Maximal Assistance - Patient 25 - 49%     Transfers Chair/bed transfer  Transfers assist     Chair/bed transfer assist level: Contact Guard/Touching assist     Locomotion Ambulation   Ambulation assist      Assist level: Minimal Assistance - Patient > 75% Assistive device: Walker-rolling Max distance: 60ft   Walk 10 feet activity   Assist  Walk 10 feet activity did not occur: Safety/medical concerns (fatigue)  Assist level: Minimal Assistance - Patient > 75% Assistive device: Walker-rolling   Walk 50 feet activity   Assist Walk 50 feet with 2 turns activity did not occur: Safety/medical concerns (fatigue)         Walk  150 feet activity   Assist Walk 150 feet activity did not occur: Safety/medical concerns (fatigue)         Walk 10 feet on uneven surface  activity   Assist Walk 10 feet on uneven surfaces activity did not occur: Safety/medical concerns (fatigue)         Wheelchair     Assist Will patient use wheelchair at discharge?: Yes Type of Wheelchair: Manual    Wheelchair assist level: Dependent - Patient 0%       Wheelchair 50 feet with 2 turns activity    Assist        Assist Level: Dependent - Patient 0%   Wheelchair 150 feet activity     Assist     Assist Level: Dependent - Patient 0%      Medical Problem List and Plan: 1. Right sided weakness with poor safety awareness and decrease in problem solving affecting mobility and ADLs secondary to Left ACA/MCA watershed infarct, small left basal ganglia and thalamic infarct, left parietal occipital infarct on 03/02/20.  Continue CIR 2. Antithrombotics: -DVT/anticoagulation:Pharmaceutical:Other (comment)--Eliquis. -antiplatelet therapy: N/A 3. Pain Management:N/A 4. Mood:LCSW to follow for evaluation and support. -antipsychotic agents: N/a 5. Neuropsych: This patientisnot fully capable of making decisions on hisown behalf. 6. Skin/Wound Care:Routine pressure relief measures. 7. Fluids/Electrolytes/Nutrition:Monitor I/Os 8. HTN: Monitor BP   Lasix/Entresto/aldactone resumed 06/14.   Coreg decreased on 6/25  Monitor with increased mobility 9. Chronic systolic CHF: Monitor Monitor for signs of overload. Continue Coreg bid, Lasix, Entresto,aldactone and Lipitor.   No ASA due to low platelets.    Filed Weights   03/09/20 0217 03/13/20 0231 03/14/20 0521  Weight: 99.8 kg 93.6 kg 81.4 kg   ?Relatibility on 6/22 10. Cirrhosis of the liver with chronic thrombocytopenia: Monitor for signs of bleeding or decompensation.   LFTs WNL on 6/15  Plts 60 on 6/22, trending down, labs ordered for tomorrow 11. Chronic SOB: Multifactorial--? OSA. On Trilegy for reactive airway disease. Duo nebs prn. Encourage IS.   Monitor with increased exertion  Stable 6/20. 12. Polysubstance abuse + Cocaine and THC on admit : Has been ongoing since 2015. Educate/encourage patient regarding life style changes.  13. Seizure disorder: Continue Keppra bid.  14. Impaired fasting glucose/Hyperglycemia: Hgb A1c- 5.2.    Blood glucose elevated on 6/18  Monitor with increased mobility 15.?  AKI on CKD stage III:   Cr.  1.51 on 6/21  Encourage fluids  Cont to monitor 16. Constipation  Bowel meds increased on 6/15, again on 6/16, decreased on 6/18, decreased again on 6/19 17.  Macrocytosis  Vitamin B12/folate within normal limits  LOS: 8 days A FACE TO FACE EVALUATION WAS PERFORMED  Devin Ganaway Karis Juba 03/14/2020, 11:53 AM

## 2020-03-14 NOTE — Progress Notes (Signed)
Speech Language Pathology Weekly Progress and Session Note  Patient Details  Name: Kevin [name] MRN: [id number] Date of Birth: 1950/10/25  Beginning of progress report period: March 07, 2020 End of progress report period: March 14, 2020  Today's Date: 03/14/2020 SLP Individual Time: 0240-9735 SLP Individual Time Calculation (min): 28 min  Short Term Goals: Week 1: SLP Short Term Goal 1 (Week 1): Pt will sustain attention to functional tasks for 5 minute intervals with Max A cues for redirection. SLP Short Term Goal 1 - Progress (Week 1): Met SLP Short Term Goal 2 (Week 1): Pt will demonsrate ability to problem solve in basic familiar functional situations with Max A verbal/visual cues. SLP Short Term Goal 2 - Progress (Week 1): Met SLP Short Term Goal 3 (Week 1): Pt will identify 1 physical and 1 cognitive impairment with Max A verbal/visual cues. SLP Short Term Goal 3 - Progress (Week 1): Progressing toward goal SLP Short Term Goal 4 (Week 1): Pt will recall new and/or daily information with Max A verbal/visual cues for use of aids or other compensatory memory strategies. SLP Short Term Goal 4 - Progress (Week 1): Met SLP Short Term Goal 5 (Week 1): Pt will follow 2-step directions with 50% accuracy provided Max A verbal and visual cues. SLP Short Term Goal 5 - Progress (Week 1): Progressing toward goal    New Short Term Goals: Week 2: SLP Short Term Goal 1 (Week 2): Pt will sustain attention to functional tasks for 10 minute intervals with Min A cues for redirection. SLP Short Term Goal 2 (Week 2): Pt will demonsrate ability to problem solve in basic familiar functional situations with Mod A verbal/visual cues. SLP Short Term Goal 3 (Week 2): Pt will identify 1 physical and 1 cognitive impairment with Max A verbal/visual cues. SLP Short Term Goal 4 (Week 2): Pt will recall new and/or daily information with Min A verbal/visual cues for use of aids or other compensatory memory  strategies. SLP Short Term Goal 5 (Week 2): Pt will follow 2-step directions with 50% accuracy provided Max A verbal and visual cues.  Weekly Progress Updates: Pt is making steady functional gains and met 3 out of 5 short term goals. Due to cognitive impairments post-acute CVA, pt currently requires Mod-Max assist for basic problem solving, following 2+ step directions, and intellectual and emergent awareness. Closer to [place] assist level required for sustained attention in short intervals of time and use of aids to recall new and daily information. Pt has demonstrated improved consistent arousal, initiation, word finding and comprehension of basic directions, as well as short term recall. Pt education is ongoing; no family has been present for ST sessions to date. Pt would continue to benefit from skilled ST while inpatient in order to maximize functional independence and reduce burden of care prior to discharge. Anticipate that pt will need 24/7 supervision at discharge in addition to [place] follow up at next level of care.       Intensity: Minumum of 1-2 x/day, 30 to 90 minutes Frequency: 3 to 5 out of 7 days Duration/Length of Stay: 03/28/20 Treatment/Interventions: Cognitive remediation/compensation;Cueing hierarchy;Functional tasks;Patient/family education;Therapeutic Activities;Internal/external aids;Multimodal communication approach;Speech/Language facilitation   Daily Session  Skilled Therapeutic Interventions: Pt was seen for skilled ST targeting cognition. SLP facilitated session with Min A verbal and visual cues for problem solving and comprehension of complex language during a basic medication management task from the ALFA. In functional conversation regarding current medications, pt recalled names of 3 medications (  as well as dosages) with no more than Min A cues. Pt left sitting in chair with alarm set and needs within reach. Continue per current plan of care.       Pain Pain  Assessment Pain Scale: 0-10 Pain Score: 0-No pain  Therapy/Group: Individual Therapy  Arbutus Leas 03/14/2020, 7:18 AM

## 2020-03-14 NOTE — Progress Notes (Signed)
Physical Therapy Weekly Progress Note  Patient Details  Name: Kevin [name] MRN: [id number] Date of Birth: 05/04/1951  Beginning of progress report period: March 07, 2020 End of progress report period: March 14, 2020  Today's Date: 03/14/2020 PT Individual Time: 2094-7096 PT Individual Time Calculation (min): 56 min   Patient has met 2 of 4 short term goals, and is making fantastic progress towards all other long term and short term goals! He has made excellent gains in mobility, functional activity tolerance, safety, and in motivation with therapy thus far, anticipate he will continue to do very well moving forward.   Patient continues to demonstrate the following deficits muscle weakness, decreased cardiorespiratoy endurance, decreased coordination and decreased motor planning, decreased attention to right and decreased motor planning, decreased attention, decreased awareness, decreased problem solving, decreased safety awareness, decreased memory and delayed processing and decreased standing balance, hemiplegia and decreased balance strategies and therefore will continue to benefit from skilled PT intervention to increase functional independence with mobility.  Patient progressing toward long term goals..  Continue plan of care.  PT Short Term Goals Week 1:  PT Short Term Goal 1 (Week 1): Patient to be able to perform bed to chair transfers with ModA PT Short Term Goal 1 - Progress (Week 1): Met PT Short Term Goal 2 (Week 1): Patient to be able to propel WC 80f with ModA PT Short Term Goal 2 - Progress (Week 1): Progressing toward goal PT Short Term Goal 3 (Week 1): Patient to be able to maintain standing for 3-4 minutes in stedy without fatigue PT Short Term Goal 3 - Progress (Week 1): Progressing toward goal PT Short Term Goal 4 (Week 1): Patient to be able to gait train 519fwith maxA and WC follow PT Short Term Goal 4 - Progress (Week 1): Met Week 2:  PT Short Term Goal 1 (Week 2):  Patient to be able to perform all functional bed <-> chair transfers with S and LRAD PT Short Term Goal 2 (Week 2): Patient to tolerate gait training at least 8035fith RW and no more than min guard PT Short Term Goal 3 (Week 2): Patient to be able to navigate single step with RW and no more than min guard PT Short Term Goal 4 (Week 2): Patient to be able to complete all bed mobility with min guard  Skilled Therapeutic Interventions/Progress Updates:     Patient received up in WC,[facility] motivated to work with therapy today. Introduced Nustep and able to tolerate riding this machine for 5 minutes with BLEs on resistance 2 with mild fatigue but no shortness of breath or respiratory fatigue. Followed this with gait training [id number] RW/R hand orthosis; he declines use of AFO for gait training today, and able to clear toes more consistently than yesterday with gait training but continues to require cues to assist with improved ankle DF approximately 40-50% of the time. Also introduced step training today- able to navigate single step with backwards approach for ascent/forward approach for descent with MinA but very fatigued. Finished session with static standing without BUE support using R UE to toss horseshoes with min guard only for balance. Continues to need extended time for processing but safety awareness and sequencing is improving. [facility], chair alarm active this morning.  Therapy Documentation Precautions:  Precautions Precautions: Fall Precaution Comments: fatigues VERY easily, need to watch O2 sats, R hemiplegia and neglect, impaired cognition Restrictions Weight Bearing  Restrictions: No General:   Vital Signs:   Pain: Pain Assessment Pain Scale: 0-10 Pain Score: 0-No pain   Therapy/Group: Individual Therapy  Windell Norfolk, DPT, PN1   Supplemental Physical Therapist Branch    Pager [phone] Acute Rehab Office [phone]

## 2020-03-14 NOTE — Progress Notes (Signed)
Occupational Therapy Session Note  Patient Details  Name: Kevin [name] MRN: [id number] Date of Birth: 1951-01-31  Today's Date: 03/14/2020 OT Individual Time:  -       Short Term Goals: Week 1:  OT Short Term Goal 1 (Week 1): Pt will donn/doff shirt with min assist OT Short Term Goal 2 (Week 1): Pt will bathe LB with mod assist OT Short Term Goal 3 (Week 1): Pt will complete toilet transfer using BSC with min assist OT Short Term Goal 4 (Week 1): Pt will complete LB dressing with mod assist.  Skilled Therapeutic Interventions/Progress Updates:    Pt supine in bed, no complaints of pain, agreeable to washing up at sink.  Pt instructed through AROM RUE and RLE elbow flex/ext, shoulder FF,digits flex/ext, and knee flex/ext, hip flexion, x 10 reps each to increase right sided attention and facilitate improved mobility.  Pt completed supine to sit EOB with mod assist noting improved BUE recruitment to push from sidelying to sitting position today.  Pt completed Sit to stand at [facility] with CGA and self initiating RUE use to reach/grasp/push RW.  Pt stood at sink with frequent intermittent VCs and TCs to facilitate increased RLE weightbearing while brushing teeth needing only CGA throughout task.  Pt completed stand to sit at [facility] with good follow through of hand placement with CGA secondary to still with slight rapid descent.  Pt bathed UB with setup using BUE without cueing needed.  Pt bathed LB needing CGA while standing to wash periarea and buttocks.  Pt donned shirt with VCs needed to facilitate compensatory technique to thread right sleeve up past elbow prior to donning left sleeve.  Pt return demonstrated with supervision.  Pt donned brief and pants with min assist.  Pt shaved beard with setup sitting sinkside.  Pt up in w/c with seatbelt alarm on, call bell in reach.  Improved standing tolerance, RUE recruitment for functional use, and increased independence with self care exhibited today.   Therapy  Documentation Precautions:  Precautions Precautions: Fall Precaution Comments: fatigues VERY easily, need to watch O2 sats, R hemiplegia and neglect, impaired cognition Restrictions Weight Bearing Restrictions: No     Therapy/Group: Individual Therapy  Amie Critchley 03/14/2020, 9:34 AM

## 2020-03-14 NOTE — Plan of Care (Signed)
Problem: RH Balance Goal: LTG Patient will maintain dynamic sitting balance (PT) Description: LTG:  Patient will maintain dynamic sitting balance with assistance during mobility activities (PT) Flowsheets (Taken 03/14/2020 1558) LTG: Pt will maintain dynamic sitting balance during mobility activities with:: (faster than expected progress) Independent with assistive device  Note: Upgraded due to faster than expected progress    Problem: RH Balance Goal: LTG Patient will maintain dynamic standing balance (PT) Description: LTG:  Patient will maintain dynamic standing balance with assistance during mobility activities (PT) Flowsheets (Taken 03/14/2020 1558) LTG: Pt will maintain dynamic standing balance during mobility activities with:: (fast progress) Supervision/Verbal cueing Note: Upgraded due to faster than expected progress    Problem: Sit to Stand Goal: LTG:  Patient will perform sit to stand with assistance level (PT) Description: LTG:  Patient will perform sit to stand with assistance level (PT) Flowsheets (Taken 03/14/2020 1558) LTG: PT will perform sit to stand in preparation for functional mobility with assistance level: (upgraded due to fast progress) Supervision/Verbal cueing Note: Upgraded due to faster than expected progress    Problem: RH Bed Mobility Goal: LTG Patient will perform bed mobility with assist (PT) Description: LTG: Patient will perform bed mobility with assistance, with/without cues (PT). Flowsheets (Taken 03/14/2020 1558) LTG: Pt will perform bed mobility with assistance level of: (upgraded due to fast progress) Supervision/Verbal cueing Note: Upgraded due to faster than expected progress    Problem: RH Bed to Chair Transfers Goal: LTG Patient will perform bed/chair transfers w/assist (PT) Description: LTG: Patient will perform bed to chair transfers with assistance (PT). Flowsheets (Taken 03/14/2020 1558) LTG: Pt will perform Bed to Chair Transfers with  assistance level: (upgraded due to faster than expected progress) Supervision/Verbal cueing Note: Upgraded due to faster than expected progress    Problem: RH Car Transfers Goal: LTG Patient will perform car transfers with assist (PT) Description: LTG: Patient will perform car transfers with assistance (PT). Flowsheets (Taken 03/14/2020 1558) LTG: Pt will perform car transfers with assist:: (upgraded due to fast progress) Supervision/Verbal cueing Note: Upgraded due to faster than expected progress    Problem: RH Furniture Transfers Goal: LTG Patient will perform furniture transfers w/assist (OT/PT) Description: LTG: Patient will perform furniture transfers  with assistance (OT/PT). Flowsheets (Taken 03/14/2020 1558) LTG: Pt will perform furniture transfers with assist:: (upgraded due to fast progress) Supervision/Verbal cueing Note: Upgraded due to faster than expected progress    Problem: RH Ambulation Goal: LTG Patient will ambulate in controlled environment (PT) Description: LTG: Patient will ambulate in a controlled environment, # of feet with assistance (PT). Flowsheets (Taken 03/14/2020 1558) LTG: Pt will ambulate in controlled environ  assist needed:: (upgraded due to fast progress) Supervision/Verbal cueing LTG: Ambulation distance in controlled environment: 120ft LRAD Note: Upgraded due to faster than expected progress    Problem: RH Wheelchair Mobility Goal: LTG Patient will propel w/c in controlled environment (PT) Description: LTG: Patient will propel wheelchair in controlled environment, # of feet with assist (PT) Flowsheets Taken 03/14/2020 1558 LTG: Pt will propel w/c in controlled environ  assist needed:: (upgraded due to fast progress) Supervision/Verbal cueing Taken 03/07/2020 1255 LTG: Propel w/c distance in controlled environment: 155ft Note: Upgraded due to faster than expected progress    Problem: RH Wheelchair Mobility Goal: LTG Patient will propel w/c in home  environment (PT) Description: LTG: Patient will propel wheelchair in home environment, # of feet with assistance (PT). Flowsheets Taken 03/14/2020 1558 LTG: Pt will propel w/c in home environ  assist needed:: (  upgraded due to fast progress) Supervision/Verbal cueing Taken 03/07/2020 1255 LTG: Propel w/c distance in home environment: 6ft Note: Upgraded due to faster than expected progress    Problem: RH Stairs Goal: LTG Patient will ambulate up and down stairs w/assist (PT) Description: LTG: Patient will ambulate up and down # of stairs with assistance (PT) Flowsheets Taken 03/14/2020 1558 LTG: Pt will ambulate up/down stairs assist needed:: (upgraded due to fast progress) Contact Guard/Touching assist Taken 03/07/2020 1255 LTG: Pt will  ambulate up and down number of stairs: 1 step, RW Note: Upgraded due to faster than expected progress   Madelaine Etienne, DPT, PN1   Supplemental Physical Therapist [facility]    Pager [phone] Acute Rehab Office [phone]

## 2020-03-15 ENCOUNTER — Inpatient Hospital Stay (HOSPITAL_COMMUNITY): Payer: Medicare HMO | Admitting: Occupational Therapy

## 2020-03-15 ENCOUNTER — Inpatient Hospital Stay (HOSPITAL_COMMUNITY): Payer: Medicare HMO

## 2020-03-15 DIAGNOSIS — R7309 Other abnormal glucose: Secondary | ICD-10-CM

## 2020-03-15 LAB — CBC WITH DIFFERENTIAL/PLATELET
Abs Immature Granulocytes: 0.02 10*3/uL (ref 0.00–0.07)
Basophils Absolute: 0 10*3/uL (ref 0.0–0.1)
Basophils Relative: 1 %
Eosinophils Absolute: 0.2 10*3/uL (ref 0.0–0.5)
Eosinophils Relative: 3 %
HCT: 42.2 % (ref 39.0–52.0)
Hemoglobin: 13.8 g/dL (ref 13.0–17.0)
Immature Granulocytes: 0 %
Lymphocytes Relative: 5 %
Lymphs Abs: 0.3 10*3/uL — ABNORMAL LOW (ref 0.7–4.0)
MCH: 33.5 pg (ref 26.0–34.0)
MCHC: 32.7 g/dL (ref 30.0–36.0)
MCV: 102.4 fL — ABNORMAL HIGH (ref 80.0–100.0)
Monocytes Absolute: 0.9 10*3/uL (ref 0.1–1.0)
Monocytes Relative: 14 %
Neutro Abs: 5 10*3/uL (ref 1.7–7.7)
Neutrophils Relative %: 77 %
Platelets: 61 10*3/uL — ABNORMAL LOW (ref 150–400)
RBC: 4.12 MIL/uL — ABNORMAL LOW (ref 4.22–5.81)
RDW: 11.5 % (ref 11.5–15.5)
WBC: 6.5 10*3/uL (ref 4.0–10.5)
nRBC: 0 % (ref 0.0–0.2)

## 2020-03-15 MED ORDER — TAMSULOSIN HCL 0.4 MG PO CAPS
0.8000 mg | ORAL_CAPSULE | Freq: Every day | ORAL | Status: DC
  Administered 2020-03-15 – 2020-03-27 (×13): 0.8 mg via ORAL
  Filled 2020-03-15 (×13): qty 2

## 2020-03-15 NOTE — Progress Notes (Signed)
Occupational Therapy Session Note  Patient Details  Name: Kevin [name] MRN: [id number] Date of Birth: Jun 25, 1951  Today's Date: 03/15/2020 OT Individual Time: 1300-1400 OT Individual Time Calculation (min): 60 min    Short Term Goals: Week 1:  OT Short Term Goal 1 (Week 1): Pt will donn/doff shirt with min assist OT Short Term Goal 2 (Week 1): Pt will bathe LB with mod assist OT Short Term Goal 3 (Week 1): Pt will complete toilet transfer using BSC with min assist OT Short Term Goal 4 (Week 1): Pt will complete LB dressing with mod assist.  Skilled Therapeutic Interventions/Progress Updates:    Pt sitting up in w/c with no c/o pain.  Pt reports he washed up already today with therapy in AM.  Pt agreeable to functional transfer training and RUE strengthening during afternoon session with OT.  Pt completed sit to stand and short distance ambulation to toilet, then stand to sit at 3 in 1 commode using RW throughout and needing CGA and intermittent VCs to improve safe spacing and mgt of RW.  Pt completed clothing mgt with CGA, however pericare not needed due to no voiding first attempt on toilet. Pt ambulated from toilet to recliner with CGA using RW.  Upon sitting, pt reports he feels like he might need to urinate.  Pt ambulated to toilet again with standing approach to toilet this occasion needing CGA. Pt attempting to discontinue after 30 seconds with no void.  Educated pt on importance of allowing approx 90 seconds to void before discontinuing.  Pt allowed for increased time and was able to successfully void urine in standing with CGA.  Pt completed small space negotiation using RW to turn and ambulate back to recliner with CGA and fair carryover of proper RW spacing needing intermittent VCs to improve. Notified nurse of pts void episode.  Pt instructed in RUE strengthening including biceps curls, wrist extension, and shoulder scaption using 3lb free weight 3 x 15 reps with intermittent TCs for  improved body mechanics.  Call bell in reach, and seat alarm on.    Therapy Documentation Precautions:  Precautions Precautions: Fall Precaution Comments: fatigues VERY easily, need to watch O2 sats, R hemiplegia and neglect, impaired cognition Restrictions Weight Bearing Restrictions: No  Therapy/Group: Individual Therapy  Amie Critchley 03/15/2020, 4:56 PM

## 2020-03-15 NOTE — Patient Care Conference (Signed)
Inpatient RehabilitationTeam Conference and Plan of Care Update Date: 03/15/2020   Time: 2:52 PM    Patient Name: Kevin [name]      Medical Record Number: [id number]  Date of Birth: 16-Jul-1951 Sex: Male         Room/Bed: 4W21C/4W21C-01 Payor Info: Payor: [employer] MEDICARE / Plan: [employer] MEDICARE HMO / Product Type: *No Product type* /    Admit Date/Time:  03/06/2020  5:51 PM  Primary Diagnosis:  Cerebrovascular accident (CVA) due to embolism of cerebral artery [facility])  Patient Active Problem List   Diagnosis Date Noted  . Labile blood glucose   . Labile blood pressure   . Macrocytosis   . Hyperglycemia   . Slow transit constipation   . Chronic systolic congestive heart failure (HCC)   . Benign essential HTN   . Embolic stroke (HCC) 03/06/2020  . Cerebrovascular accident (CVA) due to embolism of cerebral artery (HCC) 03/03/2020  . Acute focal neurological deficit 03/02/2020  . Liver cirrhosis (HCC)   . Metabolic encephalopathy   . Chronic systolic CHF (congestive heart failure) (HCC)   . Paroxysmal atrial fibrillation (HCC)   . Essential hypertension   . HCV antibody positive   . Secondary hypertension, unspecified   . Elevated troponin   . Blood pressure instability   . CAD in native artery cath 2016 90% mRamus  02/01/2016  . Atrial fibrillation, permanent (HCC) 02/01/2016  . NSTEMI (non-ST elevated myocardial infarction) (HCC)   . Status epilepticus (HCC) 01/31/2016  . High anion gap metabolic acidosis   . Lactic acidosis   . Cerebral infarction due to unspecified mechanism   . Hypertensive emergency   . Seizures (HCC)   . CVA (cerebral infarction)   . TIA (transient ischemic attack) 08/23/2015  . CKD (chronic kidney disease) stage 3, GFR 30-59 ml/min 08/23/2015  . Acute ischemic stroke (HCC) 08/23/2015  . Abnormal nuclear stress test 09/27/2014  . H/O medication noncompliance 09/27/2014  . Unstable angina (HCC) 09/27/2014  . Persistent atrial fibrillation (HCC)  08/23/2014  . Chronic diastolic heart failure (HCC) 08/23/2014  . Polysubstance abuse (HCC) 08/23/2014  . Erectile dysfunction 08/23/2014  . Cirrhosis of liver (HCC) 04/23/2014  . DOE (dyspnea on exertion) 04/19/2014  . Atrial fibrillation with RVR (HCC) 04/19/2014  . Dysphagia 04/19/2014  . Obesity 05/02/2012  . NSVT (nonsustained ventricular tachycardia) (HCC) 05/02/2012  . Thrombocytopenia (HCC) 05/02/2012  . Cholelithiases 05/01/2012  . Chest pain, no significant macrovascular CAD at cath 05/01/12; Likely due to elevated LVEDP & Afib with Diastolic HF. 04/30/2012  . Atrial fibrillation with RVR, converted to SR with TEE and DCCV 05/05/12 04/30/2012  . PAD (peripheral artery disease), PTA to Lt. SFA in 2009, total occlusion mid Rt SFA with collaterals  04/30/2012  . Cocaine abuse (HCC) 04/30/2012  . HTN (hypertension) -Accelerated with End organ involvement 04/30/2012  . Tobacco abuse 04/30/2012  . ETOH abuse 04/30/2012  . Hepatitis C antibody test positive 04/23/2012    Expected Discharge Date: Expected Discharge Date: 03/28/20  Team Members Present: Physician leading conference: Dr. Maryla Morrow Nurse Present: Ronny Bacon, RN PT Present: Grier Rocher, PT OT Present: Dolphus Jenny, OT SLP Present: Suzzette Righter, CF-SLP PPS Coordinator present : Fae Pippin, SLP     Current Status/Progress Goal Weekly Team Focus  Bowel/Bladder   incontinent of bowel and bladder.  Q6 cath for no void.  assess toileting need qshift and prn   Swallow/Nutrition/ Hydration  ADL's   CGA for functional transfers; supervision to min assist for ADLs  min assist currently; plan to upgrade to CGA-supervision level at next progress note.  ADL compensatory training, functional transfers, RUE functional use and right sided attention, standing balance.   Mobility   MinA generally for bed mobility, S-min guard transfers with RW, gait up to 17ft with MinA, single step with RW MinA; still  confused but motivation improving. Making great progress! Upgrading goals.  Min-ModA (but upgrading)  all aspects of functional mobility, gait and transfers, step training, activity tolerance   Communication   comprehension of mildly complex language/directions, expressive language [facility]  Supervision A  2-step directions, mildly complex conversational topics   Safety/Cognition/ Behavioral Observations  Mod-Max problem solving and awareness, Min-Mod attention, recall  Min A  functional problem solving, recall with strategies, intellectual and emergent awareness,   Pain   no c/o pain  To remain pain free.  assess pain qshift and PRN   Skin   no skin impairments  remain free of skin impairments  assess skin qshift and PRN    Rehab Goals Patient on target to meet rehab goals: Yes Rehab Goals Revised: patient on target with current goals *See Care Plan and progress notes for long and short-term goals.     Barriers to Discharge  Current Status/Progress Possible Resolutions Date Resolved   Nursing                  PT  Behavior  cognition, fatigue, need to confirm if it turly is 1STE and if he does have single story home with wife              OT                  SLP                Care Coordinator Decreased caregiver support;Home environment access/layout Spouse returning to work in August. 1 step to enter home on target          Discharge Planning/Teaching Needs:  Patient plans to discharge home with spouse  Will schedule with spouse if recommended   Team Discussion:  MD checking labs and adjustng medications for BP. Patient is slow moving upon awakening however function/cognition improve through the day. Continues to require I+O catheterizations for inability to void; although he has started to urinate on his own with use of medications. Team recommends 24/7 care due to problem solving/awareness issues  Revisions to Treatment Plan:  PT/OT goals upgraded to supervision level. SLP goals  upgraded to min assist as recall has improved.    Medical Summary Current Status: Right sided weakness with poor safety awareness and decrease in problem solving affecting mobility and ADLs  secondary to Left ACA/MCA watershed infarct, small left basal ganglia and thalamic infarct, left parietal occipital infarct on 03/02/20. Weekly Focus/Goal: Improve mobility, BP, urinary retention, CKD, thrombocytopenia  Barriers to Discharge: Behavior;Medical stability;Incontinence   Possible Resolutions to Barriers: Therapies, follow,  follow weights, optimize BP meds, improve urinary retention   Continued Need for Acute Rehabilitation Level of Care: The patient requires daily medical management by a physician with specialized training in physical medicine and rehabilitation for the following reasons: Direction of a multidisciplinary physical rehabilitation program to maximize functional independence : Yes Medical management of patient stability for increased activity during participation in an intensive rehabilitation regime.: Yes Analysis of laboratory values and/or radiology reports with any subsequent need for medication adjustment and/or  medical intervention. : Yes   I attest that I was present, lead the team conference, and concur with the assessment and plan of the team.   Dorien Chihuahua B 03/15/2020, 2:52 PM

## 2020-03-15 NOTE — Progress Notes (Addendum)
[place] PHYSICAL MEDICINE & REHABILITATION PROGRESS NOTE  Subjective/Complaints: Patient seen sitting up in bed this morning.  He states he slept well overnight.  He states he is tolerating his CPAP.  ROS: Denies CP, SOB, N/V/D  Objective: Vital Signs: Blood pressure (!) 118/94, pulse 65, temperature 97.7 F (36.5 C), resp. rate 17, height 6' (1.829 m), weight 81.4 kg, SpO2 100 %. No results found. Recent Labs    03/14/20 0706 03/15/20 0435  WBC 6.0 6.5  HGB 13.3 13.8  HCT 40.2 42.2  PLT 60* 61*   Recent Labs    03/13/20 0608  NA 140  K 3.9  CL 109  CO2 22  GLUCOSE 121*  BUN 21  CREATININE 1.51*  CALCIUM 8.6*    Physical Exam: BP (!) 118/94 (BP Location: Right Arm)   Pulse 65   Temp 97.7 F (36.5 C)   Resp 17   Ht 6' (1.829 m)   Wt 81.4 kg   SpO2 100%   BMI 24.34 kg/m  Constitutional: No distress . Vital signs reviewed. HENT: Normocephalic.  Atraumatic. Eyes: EOMI. No discharge. Cardiovascular: No JVD. Respiratory: Normal effort.  No stridor. GI: Non-distended. Skin: Warm and dry.  Intact. Psych: Slowed. Musc: No edema in extremities.  No tenderness in extremities. Neuro: Alert HOH Right facial weakness, stable Dysarthria, stable Motor:  RUE: Shoulder abduction 4/5, distally 4/5 with apraxia RLE: 4-/5 proximal to distal with apraxia. Right inattention improving  Assessment/Plan: 1. Functional deficits secondary to left MCA/ACA infarcts which require 3+ hours per day of interdisciplinary therapy in a comprehensive inpatient rehab setting.  Physiatrist is providing close team supervision and 24 hour management of active medical problems listed below.  Physiatrist and rehab team continue to assess barriers to discharge/monitor patient progress toward functional and medical goals  Care Tool:  Bathing    Body parts bathed by patient: Right arm, Left arm, Chest, Abdomen, Front perineal area, Left upper leg, Face, Right upper leg, Buttocks, Right  lower leg, Left lower leg   Body parts bathed by helper: Buttocks Body parts n/a: Right lower leg, Left lower leg   Bathing assist Assist Level: Contact Guard/Touching assist     Upper Body Dressing/Undressing Upper body dressing Upper body dressing/undressing activity did not occur (including orthotics): N/A What is the patient wearing?: Pull over shirt    Upper body assist Assist Level: Supervision/Verbal cueing    Lower Body Dressing/Undressing Lower body dressing    Lower body dressing activity did not occur: N/A What is the patient wearing?: Pants, Incontinence brief     Lower body assist Assist for lower body dressing: Minimal Assistance - Patient > 75%     Toileting Toileting Toileting Activity did not occur (Clothing management and hygiene only): N/A (no void or bm)  Toileting assist Assist for toileting: Maximal Assistance - Patient 25 - 49%     Transfers Chair/bed transfer  Transfers assist     Chair/bed transfer assist level: Contact Guard/Touching assist     Locomotion Ambulation   Ambulation assist      Assist level: Minimal Assistance - Patient > 75% Assistive device: Walker-rolling Max distance: 44ft   Walk 10 feet activity   Assist  Walk 10 feet activity did not occur: Safety/medical concerns (fatigue)  Assist level: Minimal Assistance - Patient > 75% Assistive device: Walker-rolling   Walk 50 feet activity   Assist Walk 50 feet with 2 turns activity did not occur: Safety/medical concerns (fatigue)  Assist level: Minimal Assistance -  Patient > 75% Assistive device: Walker-rolling    Walk 150 feet activity   Assist Walk 150 feet activity did not occur: Safety/medical concerns (fatigue)         Walk 10 feet on uneven surface  activity   Assist Walk 10 feet on uneven surfaces activity did not occur: Safety/medical concerns (fatigue)         Wheelchair     Assist Will patient use wheelchair at discharge?:  Yes Type of Wheelchair: Manual    Wheelchair assist level: Dependent - Patient 0%      Wheelchair 50 feet with 2 turns activity    Assist        Assist Level: Dependent - Patient 0%   Wheelchair 150 feet activity     Assist     Assist Level: Dependent - Patient 0%      Medical Problem List and Plan: 1. Right sided weakness with poor safety awareness and decrease in problem solving affecting mobility and ADLs secondary to Left ACA/MCA watershed infarct, small left basal ganglia and thalamic infarct, left parietal occipital infarct on 03/02/20.  Continue CIR  Team conference today to discuss current and goals and coordination of care, home and environmental barriers, and discharge planning with nursing, case manager, and therapies.  2. Antithrombotics: -DVT/anticoagulation:Pharmaceutical:Other (comment)--Eliquis. -antiplatelet therapy: N/A 3. Pain Management:N/A 4. Mood:LCSW to follow for evaluation and support. -antipsychotic agents: N/a 5. Neuropsych: This patientisnot fully capable of making decisions on hisown behalf. 6. Skin/Wound Care:Routine pressure relief measures. 7. Fluids/Electrolytes/Nutrition:Monitor I/Os 8. HTN: Monitor BP   Lasix/Entresto/aldactone resumed 06/14.   Coreg decreased on 6/25  Slightly labile on 6/23  Monitor with increased mobility 9. Chronic systolic CHF: Monitor Monitor for signs of overload. Continue Coreg bid, Lasix, Entresto,aldactone and Lipitor.   No ASA due to low platelets.    Filed Weights   03/09/20 0217 03/13/20 0231 03/14/20 0521  Weight: 99.8 kg 93.6 kg 81.4 kg   ?Relatibility on 6/22, no weights for today 10. Cirrhosis of the liver with chronic thrombocytopenia: Monitor for signs of bleeding or decompensation.   LFTs WNL on 6/15  Plts 61 on 6/23 11. Chronic SOB: Multifactorial--? OSA. On Trilegy for reactive airway disease. Duo nebs prn. Encourage IS.   Monitor with increased  exertion  Stable 6/20. 12. Polysubstance abuse + Cocaine and THC on admit : Has been ongoing since 2015. Educate/encourage patient regarding life style changes.  13. Seizure disorder: Continue Keppra bid.  14. Impaired fasting glucose/Hyperglycemia: Hgb A1c- 5.2.   Blood glucose elevated on 6/18  Monitor with increased mobility 15.?  AKI on CKD stage III:   Cr. 1.51 on 6/21  Encourage fluids  Cont to monitor 16. Constipation  Bowel meds increased on 6/15, again on 6/16, decreased on 6/18, decreased again on 6/19 17.  Macrocytosis  Vitamin B12/folate within normal limits 18. Urinary retention  Flomax increased on 6/23  Check PVRs  LOS: 9 days A FACE TO FACE EVALUATION WAS PERFORMED  Sopheap Boehle Lorie Phenix 03/15/2020, 10:28 AM

## 2020-03-15 NOTE — Progress Notes (Signed)
Patient ID: SANDRA TELLEFSEN, male   DOB: 08-Mar-1951, 69 y.o.   MRN: [id number]  Team Conference Report to Patient/Family  Team Conference discussion was reviewed with the patient , including goals, any changes in plan of care and target discharge date.  Patient and caregiver express understanding and are in agreement.  The patient has a target discharge date of 03/28/20.  Andria Rhein 03/15/2020, 1:55 PM

## 2020-03-15 NOTE — Progress Notes (Signed)
Occupational Therapy Session Note  Patient Details  Name: Kevin [name] MRN: [id number] Date of Birth: [birth year]  Today's Date: 03/15/2020 OT Individual Time: 1100-1130 OT Individual Time Calculation (min): 30 min    Short Term Goals: Week 1:  OT Short Term Goal 1 (Week 1): Pt will donn/doff shirt with min assist OT Short Term Goal 2 (Week 1): Pt will bathe LB with mod assist OT Short Term Goal 3 (Week 1): Pt will complete toilet transfer using BSC with min assist OT Short Term Goal 4 (Week 1): Pt will complete LB dressing with mod assist.      Skilled Therapeutic Interventions/Progress Updates:    Pt received in wc ready for therapy. Focused on balance and LE strength with sit to stands by pushing up with B hands and then holding walker for support.  Pt able to do this 8 times with close S to light CGA.   Once standing, when he shifts his weight his knee does buckle. Pt able to straighten knee to correct.  From seated, focused on R shoulder AROM with a/arom exercises using a dowel bar. He is now actively reaching arm to 100-120 degrees.  Also worked on coordination with toss and catch activity with B hands without difficulty. Single handed with R hand more difficulty but pt able to catch block about 50% of the time.    Pt continuing to rest in wc with chair pad alarm on and all needs met.   Therapy Documentation Precautions:  Precautions Precautions: Fall Precaution Comments: fatigues VERY easily, need to watch O2 sats, R hemiplegia and neglect, impaired cognition Restrictions Weight Bearing Restrictions: No    Vital Signs: Therapy Vitals Temp: 97.8 F (36.6 C) Pulse Rate: 86 Resp: 17 BP: (!) 150/96 Patient Position (if appropriate): Sitting Oxygen Therapy SpO2: 98 % O2 Device: Room Air Pain: Pain Assessment Pain Score: 0-No pain    Therapy/Group: Individual Therapy  Hope 03/15/2020, 1:39 PM

## 2020-03-15 NOTE — Plan of Care (Signed)
  Problem: Consults Goal: RH STROKE PATIENT EDUCATION Description: See Patient Education module for education specifics  Outcome: Progressing   Problem: RH BOWEL ELIMINATION Goal: RH STG MANAGE BOWEL WITH ASSISTANCE Description: STG Manage Bowel with min Assistance. Outcome: Progressing   Problem: RH SAFETY Goal: RH STG ADHERE TO SAFETY PRECAUTIONS W/ASSISTANCE/DEVICE Description: STG Adhere to Safety Precautions With cues and reminders. Outcome: Progressing   Problem: RH COGNITION-NURSING Goal: RH STG USES MEMORY AIDS/STRATEGIES W/ASSIST TO PROBLEM SOLVE Description: STG Uses Memory Aids/Strategies With supervision Assistance to Problem Solve. Outcome: Progressing   Problem: RH PAIN MANAGEMENT Goal: RH STG PAIN MANAGED AT OR BELOW PT'S PAIN GOAL Description: Pain level less than 4 on scale of 0-10 Outcome: Progressing   Problem: RH KNOWLEDGE DEFICIT Goal: RH STG INCREASE KNOWLEDGE OF HYPERTENSION Description: Pt will be able to adhere to medication regimen, dietary restriction and lifestyle modifications to better control hypertension and further complications with min assist from family upon discharge.  Outcome: Progressing Goal: RH STG INCREASE KNOWLEGDE OF HYPERLIPIDEMIA Description: Pt will be able to adhere to medication regimen, dietary restriction and lifestyle modifications to prevent high cholesterol and further complications with min assist from family upon discharge.  Outcome: Progressing Goal: RH STG INCREASE KNOWLEDGE OF STROKE PROPHYLAXIS Description: Pt will be able to adhere to medication regimen, dietary restriction and lifestyle modifications to prevent stroke and further complications with min assist from family upon discharge.  Outcome: Progressing   Problem: RH BLADDER ELIMINATION Goal: RH STG MANAGE BLADDER WITH ASSISTANCE Description: STG Manage Bladder With min Assistance Outcome: Not Progressing

## 2020-03-15 NOTE — Progress Notes (Signed)
Physical Therapy Session Note  Patient Details  Name: Kevin [name] MRN: [id number] Date of Birth: 02-05-1951  Today's Date: 03/15/2020 PT Individual Time: 0900-0954 PT Individual Time Calculation (min): 54 min   Short Term Goals: Week 2:  PT Short Term Goal 1 (Week 2): Patient to be able to perform all functional bed <-> chair transfers with S and LRAD PT Short Term Goal 2 (Week 2): Patient to tolerate gait training at least 5f with RW and no more than min guard PT Short Term Goal 3 (Week 2): Patient to be able to navigate single step with RW and no more than min guard PT Short Term Goal 4 (Week 2): Patient to be able to complete all bed mobility with min guard  Skilled Therapeutic Interventions/Progress Updates: Pt presented in bed with NT present taking vitals agreeable to therapy. Pt denies pain during session. Pt donned shorts minA supine and performed bridge CGA to pull pants over hips. Pt performed supine to sit with minA and use of bed features with PTA providing cues to use R elbow to push into bed to facilitate truncal support. PTA donned socks and shoes for time management. Pt performed stand pivot transfer CGA to w/c no AD. Pt transported to hallway in gym area to participate in gait training. Pt ambulated [id number] minA and verbal cues for increased erect posture, and increasing quad activation. Pt with noted increased episodes of genu recurvatum but noted with increased forward flexed posture which when pt improved posture decreased occurences of GR noted.  Pt then transported to rehab gym and performed stand pivot transfer to mat CGA. Pt then participated in supine RLE therex for forced use of RLE and specific hamstring/quad strengthening. Pt performed hamstring pulls on yellow physioball and manual resisted leg press x 15, SAQ with 3lb cuff x 15, transferred to sitting EOB with minA and performed hamstring pulls with level 3 resistance band x 15. Performed squat  pivot to w/c and transported back to room. Pt agreeable to remain in w/c at end of session and left with seat alarm on, call bell within reach and needs met.      Therapy Documentation Precautions:  Precautions Precautions: Fall Precaution Comments: fatigues VERY easily, need to watch O2 sats, R hemiplegia and neglect, impaired cognition Restrictions Weight Bearing Restrictions: No General:   Vital Signs:  Pain: Pain Assessment Pain Score: 0-No pain Mobility:   Locomotion :    Trunk/Postural Assessment :    Balance:   Exercises:   Other Treatments:      Therapy/Group: Individual Therapy  Cache Decoursey 03/15/2020, 5:27 PM

## 2020-03-16 ENCOUNTER — Inpatient Hospital Stay (HOSPITAL_COMMUNITY): Payer: Medicare HMO | Admitting: Physical Therapy

## 2020-03-16 ENCOUNTER — Inpatient Hospital Stay (HOSPITAL_COMMUNITY): Payer: Medicare HMO | Admitting: Occupational Therapy

## 2020-03-16 ENCOUNTER — Inpatient Hospital Stay (HOSPITAL_COMMUNITY): Payer: Medicare HMO | Admitting: Speech Pathology

## 2020-03-16 DIAGNOSIS — R339 Retention of urine, unspecified: Secondary | ICD-10-CM

## 2020-03-16 NOTE — Progress Notes (Signed)
Pt last BM noted on 03/13/2020. In attempt to get pt to have a BM this LPN offered pt bisacodyl (Dulcolax) suppository but pt refused.

## 2020-03-16 NOTE — Progress Notes (Signed)
[facility] PHYSICAL MEDICINE & REHABILITATION PROGRESS NOTE  Subjective/Complaints: Patient seen sitting up in bed this morning, working with therapies.  He states he slept well overnight.  He is questions regarding discharge date and functional improvement.  ROS: Denies CP, SOB, N/V/D  Objective: Vital Signs: Blood pressure (!) 135/103, pulse 79, temperature 97.6 F (36.4 C), resp. rate 17, height 6' (1.829 m), weight 88.6 kg, SpO2 91 %. No results found. Recent Labs    03/14/20 0706 03/15/20 0435  WBC 6.0 6.5  HGB 13.3 13.8  HCT 40.2 42.2  PLT 60* 61*   No results for input(s): NA, K, CL, CO2, GLUCOSE, BUN, CREATININE, CALCIUM in the last 72 hours.  Physical Exam: BP (!) 135/103 (BP Location: Left Arm)   Pulse 79   Temp 97.6 F (36.4 C)   Resp 17   Ht 6' (1.829 m)   Wt 88.6 kg   SpO2 91%   BMI 26.49 kg/m  Constitutional: No distress . Vital signs reviewed. HENT: Normocephalic.  Atraumatic. Eyes: EOMI. No discharge. Cardiovascular: No JVD. Respiratory: Normal effort.  No stridor. GI: Non-distended. Skin: Warm and dry.  Intact. Psych: Slowed. Musc: No edema in extremities.  No tenderness in extremities. Neuro: Alert HOH Right facial weakness, stable Dysarthria, unchanged Motor:  RUE: Shoulder abduction 4/5, distally 4/5 with apraxia, improving RLE: 4/5 proximal to distal with apraxia. Right inattention improving  Assessment/Plan: 1. Functional deficits secondary to left MCA/ACA infarcts which require 3+ hours per day of interdisciplinary therapy in a comprehensive inpatient rehab setting.  Physiatrist is providing close team supervision and 24 hour management of active medical problems listed below.  Physiatrist and rehab team continue to assess barriers to discharge/monitor patient progress toward functional and medical goals  Care Tool:  Bathing    Body parts bathed by patient: Right arm, Left arm, Chest, Abdomen, Front perineal area, Left upper leg,  Face, Right upper leg, Buttocks, Right lower leg, Left lower leg   Body parts bathed by helper: Buttocks Body parts n/a: Right lower leg, Left lower leg   Bathing assist Assist Level: Contact Guard/Touching assist     Upper Body Dressing/Undressing Upper body dressing Upper body dressing/undressing activity did not occur (including orthotics): N/A What is the patient wearing?: Pull over shirt    Upper body assist Assist Level: Supervision/Verbal cueing    Lower Body Dressing/Undressing Lower body dressing    Lower body dressing activity did not occur: N/A What is the patient wearing?: Pants, Incontinence brief     Lower body assist Assist for lower body dressing: Minimal Assistance - Patient > 75%     Toileting Toileting Toileting Activity did not occur (Clothing management and hygiene only): N/A (no void or bm)  Toileting assist Assist for toileting: Contact Guard/Touching assist     Transfers Chair/bed transfer  Transfers assist     Chair/bed transfer assist level: Contact Guard/Touching assist     Locomotion Ambulation   Ambulation assist      Assist level: Minimal Assistance - Patient > 75% Assistive device: Walker-rolling Max distance: 35ft   Walk 10 feet activity   Assist  Walk 10 feet activity did not occur: Safety/medical concerns (fatigue)  Assist level: Minimal Assistance - Patient > 75% Assistive device: Walker-rolling   Walk 50 feet activity   Assist Walk 50 feet with 2 turns activity did not occur: Safety/medical concerns (fatigue)  Assist level: Minimal Assistance - Patient > 75% Assistive device: Walker-rolling    Walk 150 feet activity  Assist Walk 150 feet activity did not occur: Safety/medical concerns (fatigue)         Walk 10 feet on uneven surface  activity   Assist Walk 10 feet on uneven surfaces activity did not occur: Safety/medical concerns (fatigue)         Wheelchair     Assist Will patient use  wheelchair at discharge?: Yes Type of Wheelchair: Manual    Wheelchair assist level: Dependent - Patient 0%      Wheelchair 50 feet with 2 turns activity    Assist        Assist Level: Dependent - Patient 0%   Wheelchair 150 feet activity     Assist     Assist Level: Dependent - Patient 0%      Medical Problem List and Plan: 1. Right sided weakness with poor safety awareness and decrease in problem solving affecting mobility and ADLs secondary to Left ACA/MCA watershed infarct, small left basal ganglia and thalamic infarct, left parietal occipital infarct on 03/02/20.  Continue CIR 2. Antithrombotics: -DVT/anticoagulation:Pharmaceutical:Other (comment)--Eliquis. -antiplatelet therapy: N/A 3. Pain Management:N/A 4. Mood:LCSW to follow for evaluation and support. -antipsychotic agents: N/a 5. Neuropsych: This patientisnot fully capable of making decisions on hisown behalf. 6. Skin/Wound Care:Routine pressure relief measures. 7. Fluids/Electrolytes/Nutrition:Monitor I/Os 8. HTN: Monitor BP   Lasix/Entresto/aldactone resumed 06/14.   Coreg decreased on 6/25  Slightly labile with elevated diastolic pressures on 6/24  Monitor with increased mobility 9. Chronic systolic CHF: Monitor Monitor for signs of overload. Continue Coreg bid, Lasix, Entresto,aldactone and Lipitor.   No ASA due to low platelets.    Filed Weights   03/13/20 0231 03/14/20 0521 03/16/20 0641  Weight: 93.6 kg 81.4 kg 88.6 kg   ?Relatibility on 6/24 10. Cirrhosis of the liver with chronic thrombocytopenia: Monitor for signs of bleeding or decompensation.   LFTs WNL on 6/15  Plts 61 on 6/23 11. Chronic SOB: Multifactorial--? OSA. On Trilegy for reactive airway disease. Duo nebs prn. Encourage IS.   Monitor with increased exertion  Stable 6/20. 12. Polysubstance abuse + Cocaine and THC on admit : Has been ongoing since 2015. Educate/encourage patient  regarding life style changes.  13. Seizure disorder: Continue Keppra bid.  14. Impaired fasting glucose/Hyperglycemia: Hgb A1c- 5.2.   Blood glucose elevated on 6/18  Monitor with increased mobility 15.?  AKI on CKD stage III:   Cr.  1.51 on 6/21  Encourage fluids  Cont to monitor 16. Constipation  Bowel meds increased on 6/15, again on 6/16, decreased on 6/18, decreased again on 6/19  Will consider further increase again if necessary 17.  Macrocytosis  Vitamin B12/folate within normal limits 18. Urinary retention  Flomax increased on 6/23  PVRs showing retention  LOS: 10 days A FACE TO FACE EVALUATION WAS PERFORMED  Kevin [name] Karis Juba 03/16/2020, 11:12 AM

## 2020-03-16 NOTE — Progress Notes (Signed)
Occupational Therapy Weekly Progress Note  Patient Details  Name: Kevin [name] MRN: [id number] Date of Birth: June 20, 1951  Beginning of progress report period: March 07, 2020 End of progress report period: March 16, 2020  Today's Date: 03/16/2020 OT Individual Time:first session: 5638-9373; second session: 0105-0135 OT Individual Time Calculation (min): first session: 45 min; second session: 30 min    Patient has met 4 of 4 short term goals.  Pt exhibits increased right sided attention, problem solving, and safety awareness, improved RUE strength fine motor control, increased endurance and activity tolerance, and improved sitting and standing balance which has facilitated increased independence during BADLs including dressing, bathing, toileting, and grooming/hygiene.  Pt is very motivated to progress, follow through with skilled training techniques, and participates consistently throughout OT sessions. Pt currently requires CGA for functional mobility using RW and supervision with VCs for problem solving during self care tasks.    Patient continues to demonstrate the following deficits: muscle weakness, decreased motor planning, decreased problem solving, decreased safety awareness and delayed processing and decreased standing balance and therefore will continue to benefit from skilled OT intervention to enhance overall performance with BADL.  Patient progressing toward long term goals..  Continue plan of care.  OT Short Term Goals Week 1:  OT Short Term Goal 1 (Week 1): Pt will donn/doff shirt with min assist OT Short Term Goal 1 - Progress (Week 1): Met OT Short Term Goal 2 (Week 1): Pt will bathe LB with mod assist OT Short Term Goal 2 - Progress (Week 1): Met OT Short Term Goal 3 (Week 1): Pt will complete toilet transfer using BSC with min assist OT Short Term Goal 3 - Progress (Week 1): Met OT Short Term Goal 4 (Week 1): Pt will complete LB dressing with mod assist. OT Short Term  Goal 4 - Progress (Week 1): Met Week 2:  OT Short Term Goal 1 (Week 2): Pt will complete UB/LB bathing in tub shower in sitting and standing with supervision. OT Short Term Goal 2 (Week 2): Pt will complete tub shower transfer with supervision using least restrictive AD. OT Short Term Goal 3 (Week 2): Pt will complete toilet transfer with supervision. OT Short Term Goal 4 (Week 2): Pt will complete LB dressing with supervision.  Skilled Therapeutic Interventions/Progress Updates:    First session: Pt sitting up in w/c, agreeable to working with OT, no c/o pain.  Pt reports he doesn't need to wash up, with OT providing VC's to orient pt that current clothes have been worn 3 days in a row.  Pt agreeable thereafter to washing up at sink and changing into clean clothes.  Pt self propelled to sink with min assist to complete tight right turn.  Pt completed oral hygiene sitting sinkside with mod I.  Pt doffed shirt and washed UB with supervision and min assist to wash back.  Pt donned clean shirt with setup.  Pt completed sit to stand at sink with CGA and doffed pants and supervision to doff socks needing min VCs to complete using compensatory technique secondary to pt attempting to lean over to doff with resultant SOB.  Pt washed LB with CGA during standing portion to wash buttocks.  Pt requesting to urinate at toilet.  Pt self propelled to bathroom with min assist.  Pt stood for successful voiding of urine with CGA and VCs for safe hand placement at grab bars.  Pt needing max assist to propel in small space and return bedside.  Pt  donned brief, pants, socks and shoes with max assist due to time constraint.  Call bell in reach, seat alarm on.  Second session: Pt sitting up in w/c, appearing with some somnolence, no c/o pain.  Pt reports he has some things on his mind including frustration with not having heard from wife yesterday.  Pt agreeable to attempting to call wife with OT present.  OT collaborated with  wife via phone with pt present, regarding home setup, tentative DME equipment needs, and pts current functional status.  Pts wife confirmed one story home with 2 stair entry no rails, tub shower in bathroom.  Pts wife reports having only rollator for DME currently at home.  Pt refusing politely further skilled OT after finishing conversation with wife, stating "I promise I will start fresh tomorrow".  Pt requesting back to bed.  Squat pivot transfer w/c to EOB and sit to supine completed by pt with supervision.  Call bell in reach, bed alarm on.   Therapy Documentation Precautions:  Precautions Precautions: Fall Precaution Comments: fatigues VERY easily, need to watch O2 sats, R hemiplegia and neglect, impaired cognition Restrictions Weight Bearing Restrictions: No   Therapy/Group: Individual Therapy  Kevin [name] 03/16/2020, 4:06 PM

## 2020-03-16 NOTE — Progress Notes (Signed)
Pt refusing to wear cpap tonight.  

## 2020-03-16 NOTE — Progress Notes (Signed)
Speech Language Pathology Daily Session Note  Patient Details  Name: Kevin [name] MRN: [id number] Date of Birth: [birth year]  Today's Date: 03/16/2020 SLP Individual Time: 4496-7591 SLP Individual Time Calculation (min): 44 min  Short Term Goals: Week 2: SLP Short Term Goal 1 (Week 2): Pt will sustain attention to functional tasks for 10 minute intervals with Min A cues for redirection. SLP Short Term Goal 2 (Week 2): Pt will demonsrate ability to problem solve in basic familiar functional situations with Mod A verbal/visual cues. SLP Short Term Goal 3 (Week 2): Pt will identify 1 physical and 1 cognitive impairment with Max A verbal/visual cues. SLP Short Term Goal 4 (Week 2): Pt will recall new and/or daily information with Min A verbal/visual cues for use of aids or other compensatory memory strategies. SLP Short Term Goal 5 (Week 2): Pt will follow 2-step directions with 50% accuracy provided Max A verbal and visual cues.  Skilled Therapeutic Interventions: Pt was seen for skilled ST targeting cognition. SLP facilitated session with a complex medication management task. Pt recalled 2 out of 9 current medication names and functions independently. He used an external aid to recall remaining medication names, functions, dosages, with Min A verbal and visual cues for organization. He used list to organize a QID pill box with Min A verbal and visual cues for problem solving, able to self-correct error X1. He did required extra time to complete tasks, for cognitive processing as well as motor planning. Although he did not recall room number, he did provide directions to accurately return to room (from speech office) Mod I. Pt left sitting in wheelchair with alarm set and needs within reach. Continue per current plan of care.        Pain Pain Assessment Pain Scale: 0-10 Pain Score: 0-No pain  Therapy/Group: Individual Therapy  [name] 03/16/2020, 6:57 AM

## 2020-03-16 NOTE — Progress Notes (Signed)
Physical Therapy Session Note  Patient Details  Name: Kevin [name] MRN: [id number] Date of Birth: July 20, 1951  Today's Date: 03/16/2020 PT Individual Time: 0802-0859 PT Individual Time Calculation (min): 57 min   Short Term Goals: Week 2:  PT Short Term Goal 1 (Week 2): Patient to be able to perform all functional bed <-> chair transfers with S and LRAD PT Short Term Goal 2 (Week 2): Patient to tolerate gait training at least 73f with RW and no more than min guard PT Short Term Goal 3 (Week 2): Patient to be able to navigate single step with RW and no more than min guard PT Short Term Goal 4 (Week 2): Patient to be able to complete all bed mobility with min guard  Skilled Therapeutic Interventions/Progress Updates:    Patient received in bed finishing breakfast, very confused this morning and requiring continuing reorientation and selfcare as well as reasons for recommendation for stay in rehab until 7/6. Able to complete bed mobility with Min guard, transfer with WC with min guard/no device and extended time. Spent quite a bit of time working on gait related training today including gait training 957fwith RW/min guard and Mod VC for ankle DF/toe clearance consistently during gait. Continued practicing single stair training with RW/MinA and Mod VC, as well as progression to 4 steps with B rails with 2 person min guard-MinA for safety with Mod cues for sequencing. [facility] met, chair alarm active. Continues to progress well!   Therapy Documentation Precautions:  Precautions Precautions: Fall Precaution Comments: fatigues VERY easily, need to watch O2 sats, R hemiplegia and neglect, impaired cognition Restrictions Weight Bearing Restrictions: No   Pain: Pain Assessment Pain Scale: 0-10 Pain Score: 0-No pain    Therapy/Group: Individual Therapy   KrWindell NorfolkDPT, PN1   Supplemental Physical Therapist [place]  Pager [phone]  Office [phone]  03/16/2020, 12:09 PM

## 2020-03-17 ENCOUNTER — Inpatient Hospital Stay (HOSPITAL_COMMUNITY): Payer: Medicare HMO | Admitting: Speech Pathology

## 2020-03-17 ENCOUNTER — Inpatient Hospital Stay (HOSPITAL_COMMUNITY): Payer: Medicare HMO | Admitting: Occupational Therapy

## 2020-03-17 ENCOUNTER — Inpatient Hospital Stay (HOSPITAL_COMMUNITY): Payer: Medicare HMO | Admitting: Physical Therapy

## 2020-03-17 DIAGNOSIS — I4819 Other persistent atrial fibrillation: Secondary | ICD-10-CM

## 2020-03-17 NOTE — Progress Notes (Signed)
Occupational Therapy Session Note  Patient Details  Name: Kevin [name] MRN: [id number] Date of Birth: Nov 03, 1950  Today's Date: 03/17/2020 OT Individual Time: 6237-6283 OT Individual Time Calculation (min): 57 min    Short Term Goals: Week 2:  OT Short Term Goal 1 (Week 2): Pt will complete UB/LB bathing in tub shower in sitting and standing with supervision. OT Short Term Goal 2 (Week 2): Pt will complete tub shower transfer with supervision using least restrictive AD. OT Short Term Goal 3 (Week 2): Pt will complete toilet transfer with supervision. OT Short Term Goal 4 (Week 2): Pt will complete LB dressing with supervision.  Skilled Therapeutic Interventions/Progress Updates:    Pt received in bed awake and alert. Pt initially declined taking a shower, but then agreed to it.  Pt completed stand pivot bed to w/c and then to tub bench with CGA to close S.  Pt bathed on bench with 1 sit to stand with close S. Due to tight shower fit and pt's height he did not have enough room to lean forward to wash his feet. He can cross his L leg, but not his R leg well enough to wash his feet. He can use a long sponge next time.  Pt transferred back to wc to dress with S.  Improved R hand coordination as he was actively using his hand to complete washing, opening containers, tying shoes.   Pt then completed oral care at sink with set up.  Worked on R hand coordination exercises with toss catch activity.  Hand off to next OT for next session.      Therapy Documentation Precautions:  Precautions Precautions: Fall Precaution Comments: fatigues VERY easily, need to watch O2 sats, R hemiplegia and neglect, impaired cognition Restrictions Weight Bearing Restrictions: No    Vital Signs: Therapy Vitals Temp: 98.2 F (36.8 C) Pulse Rate: 82 Resp: 18 BP: (!) 149/87 Patient Position (if appropriate): Lying Oxygen Therapy SpO2: 97 % O2 Device: Room Air Pain: Pain Assessment Pain Score:  0-No pain ADL: ADL Eating: Moderate cueing, Set up, Supervision/safety Where Assessed-Eating: Bed level Grooming: Minimal assistance Where Assessed-Grooming: Sitting at sink Upper Body Bathing: Moderate assistance Where Assessed-Upper Body Bathing: Sitting at sink Lower Body Bathing: Maximal assistance Where Assessed-Lower Body Bathing: Sitting at sink Upper Body Dressing: Moderate assistance Where Assessed-Upper Body Dressing: Sitting at sink Lower Body Dressing: Moderate assistance Where Assessed-Lower Body Dressing: Standing at sink, Sitting at sink Toileting: Maximal assistance Where Assessed-Toileting: Bedside Commode Toilet Transfer: Moderate assistance Toilet Transfer Method: Stand pivot Toilet Transfer Equipment: Animator Transfer: Not assessed  Therapy/Group: Individual Therapy  Kevin [name] 03/17/2020, 9:09 AM

## 2020-03-17 NOTE — Progress Notes (Signed)
Speech Language Pathology Daily Session Note  Patient Details  Name: Kevin [name] MRN: [id number] Date of Birth: [birth year]  Today's Date: 03/17/2020 SLP Individual Time: 4680-3212 SLP Individual Time Calculation (min): 29 min  Short Term Goals: Week 2: SLP Short Term Goal 1 (Week 2): Pt will sustain attention to functional tasks for 10 minute intervals with Min A cues for redirection. SLP Short Term Goal 2 (Week 2): Pt will demonsrate ability to problem solve in basic familiar functional situations with Mod A verbal/visual cues. SLP Short Term Goal 3 (Week 2): Pt will identify 1 physical and 1 cognitive impairment with Max A verbal/visual cues. SLP Short Term Goal 4 (Week 2): Pt will recall new and/or daily information with Min A verbal/visual cues for use of aids or other compensatory memory strategies. SLP Short Term Goal 5 (Week 2): Pt will follow 2-step directions with 50% accuracy provided Max A verbal and visual cues.  Skilled Therapeutic Interventions: Pt was seen for skilled ST targeting cognition. SLP facilitated session with continuation of complex medication management task started in previous ST session. Pt required overall Min A for problem solving and error awareness when using list to organize QID pill box. He self-corrected error X1 and demonstrated improved ability to comprehend and list, as well as improved organization and working memory/short term recall within task- Supervision A. Pt sustained attention for 10 minute intervals with Min A for redirection. Pt left sitting in chair with alarm set and needs within reach. Continue per current plan of care.           Pain Pain Assessment Pain Scale: 0-10 Pain Score: 0-No pain  Therapy/Group: Individual Therapy  [name] 03/17/2020, 5:23 AM

## 2020-03-17 NOTE — Plan of Care (Signed)
  Problem: Consults Goal: RH STROKE PATIENT EDUCATION Description: See Patient Education module for education specifics  Outcome: Progressing   Problem: RH BOWEL ELIMINATION Goal: RH STG MANAGE BOWEL WITH ASSISTANCE Description: STG Manage Bowel with min Assistance. Outcome: Progressing   Problem: RH SAFETY Goal: RH STG ADHERE TO SAFETY PRECAUTIONS W/ASSISTANCE/DEVICE Description: STG Adhere to Safety Precautions With cues and reminders. Outcome: Progressing   Problem: RH COGNITION-NURSING Goal: RH STG USES MEMORY AIDS/STRATEGIES W/ASSIST TO PROBLEM SOLVE Description: STG Uses Memory Aids/Strategies With supervision Assistance to Problem Solve. Outcome: Progressing   Problem: RH PAIN MANAGEMENT Goal: RH STG PAIN MANAGED AT OR BELOW PT'S PAIN GOAL Description: Pain level less than 4 on scale of 0-10 Outcome: Progressing   Problem: RH KNOWLEDGE DEFICIT Goal: RH STG INCREASE KNOWLEDGE OF HYPERTENSION Description: Pt will be able to adhere to medication regimen, dietary restriction and lifestyle modifications to better control hypertension and further complications with min assist from family upon discharge.  Outcome: Progressing Goal: RH STG INCREASE KNOWLEGDE OF HYPERLIPIDEMIA Description: Pt will be able to adhere to medication regimen, dietary restriction and lifestyle modifications to prevent high cholesterol and further complications with min assist from family upon discharge.  Outcome: Progressing Goal: RH STG INCREASE KNOWLEDGE OF STROKE PROPHYLAXIS Description: Pt will be able to adhere to medication regimen, dietary restriction and lifestyle modifications to prevent stroke and further complications with min assist from family upon discharge.  Outcome: Progressing   Problem: RH BLADDER ELIMINATION Goal: RH STG MANAGE BLADDER WITH ASSISTANCE Description: STG Manage Bladder With min Assistance Outcome: Not Progressing   

## 2020-03-17 NOTE — Progress Notes (Signed)
Pt bladder scanned at 537. Pt refusing to be cath states that he wants to wait until after breakfast to get up and use the restroom.

## 2020-03-17 NOTE — Progress Notes (Signed)
Occupational Therapy Session Note  Patient Details  Name: Kevin [name] MRN: [id number] Date of Birth: 31-Jan-1951  Today's Date: 03/17/2020 OT Individual Time: 0931-1001 OT Individual Time Calculation (min): 30 min    Short Term Goals: Week 2:  OT Short Term Goal 1 (Week 2): Pt will complete UB/LB bathing in tub shower in sitting and standing with supervision. OT Short Term Goal 2 (Week 2): Pt will complete tub shower transfer with supervision using least restrictive AD. OT Short Term Goal 3 (Week 2): Pt will complete toilet transfer with supervision. OT Short Term Goal 4 (Week 2): Pt will complete LB dressing with supervision.  Skilled Therapeutic Interventions/Progress Updates:    Pt sitting up in w/c agreeable to OT session.  Dependent transport in w/c to gym for EOM BUE gross motor activity and RUE strengthening.  Pt completed SPT w/c to EOM with CGA using RW.  Pt instructed through BUE sequencing and body mechanics during zoom ball task x 10 reps sitting unsupported.  Pt instructed through 3 lb free weight biceps and shoulder scaption therex, and level 3 tband rows and lats needing mod VCs and TCs to improve correct muscle recruitment.  Pt completed 2 x 15 reps.  Pt completed SPT EOM to w/c using RW with CGA.  Transported back to room, call bell in reach, seat alarm on. Pt exhibits RUE weakness with pt utilizing compensatory muscle recruitment intermittently requiring cueing to reduce.  Pt exhibiting improved right sided awareness and increased grip strength, therefore discontinued hand splint for walker.    Therapy Documentation Precautions:  Precautions Precautions: Fall Precaution Comments: fatigues VERY easily, need to watch O2 sats, R hemiplegia and neglect, impaired cognition Restrictions Weight Bearing Restrictions: No   Therapy/Group: Individual Therapy  Amie Critchley 03/17/2020, 12:47 PM

## 2020-03-17 NOTE — Progress Notes (Signed)
Physical Therapy Session Note  Patient Details  Name: Kevin [name] MRN: [id number] Date of Birth: [birth year]  Today's Date: 03/17/2020 PT Individual Time: 1011-1042 PT Individual Time Calculation (min): 31 min   Short Term Goals: Week 2:  PT Short Term Goal 1 (Week 2): Patient to be able to perform all functional bed <-> chair transfers with S and LRAD PT Short Term Goal 2 (Week 2): Patient to tolerate gait training at least 61f with RW and no more than min guard PT Short Term Goal 3 (Week 2): Patient to be able to navigate single step with RW and no more than min guard PT Short Term Goal 4 (Week 2): Patient to be able to complete all bed mobility with min guard  Skilled Therapeutic Interventions/Progress Updates:    [facility] and willing to participate in PT today. Fatigued from work earlier this morning. Tolerated WC pulls [id number] first using L UE/LLE then progressing to just BLEs for improved strength and extra practice for R ankle dorsiflexion. Otherwise practiced standing with cross midline reaches with RUE to grasp clothespins and place on/off various thickness rods. Returned to room and [facility] needs met, chair alarm active.   Therapy Documentation Precautions:  Precautions Precautions: Fall Precaution Comments: fatigues VERY easily, need to watch O2 sats, R hemiplegia and neglect, impaired cognition Restrictions Weight Bearing Restrictions: No Pain: Pain Assessment Pain Scale: 0-10 Pain Score: 0-No pain    Therapy/Group: Individual Therapy   KrWindell NorfolkDPT, PN1   Supplemental Physical Therapist [place]  Pager [phone] Office [phone]  03/17/2020, 12:31 PM

## 2020-03-17 NOTE — Progress Notes (Signed)
Physical Therapy Session Note  Patient Details  Name: Kevin [name] MRN: [id number] Date of Birth: 05/12/1951  Today's Date: 03/17/2020 PT Individual Time: 9872-1587 PT Individual Time Calculation (min): 28 min   Short Term Goals: Week 2:  PT Short Term Goal 1 (Week 2): Patient to be able to perform all functional bed <-> chair transfers with S and LRAD PT Short Term Goal 2 (Week 2): Patient to tolerate gait training at least 25f with RW and no more than min guard PT Short Term Goal 3 (Week 2): Patient to be able to navigate single step with RW and no more than min guard PT Short Term Goal 4 (Week 2): Patient to be able to complete all bed mobility with min guard  Skilled Therapeutic Interventions/Progress Updates:    [facility]. Able to progress gait distance significantly today to 1219f2 with RW and very light min guard with Min cues for clearing toes today- cues only provided approximately 25% of the time however he does at times drag his toes and occasionally trips over toes at times. Able to perform functional transfers with S and RW with cues for safety and hand placement as well as sequencing. Spouse arrived at [facility] and was educated on progress with PT, levels of assist and progress currently, general PT prognosis, and likely AFO consult- she does confirm that he was dragging his foot before this stroke even happened and is supportive of him getting a brace. Left in bed with all needs met, bed alarm active and spouse present this afternoon.   Therapy Documentation Precautions:  Precautions Precautions: Fall Precaution Comments: fatigues VERY easily, need to watch O2 sats, R hemiplegia and neglect, impaired cognition Restrictions Weight Bearing Restrictions: No Pain: Pain Assessment Pain Scale: 0-10 Pain Score: 0-No pain    Therapy/Group: Individual Therapy   KrWindell NorfolkDPT, PN1   Supplemental Physical Therapist [place]   Pager [phone] Office [phone]  03/17/2020, 3:37 PM

## 2020-03-17 NOTE — Progress Notes (Addendum)
[place] PHYSICAL MEDICINE & REHABILITATION PROGRESS NOTE  Subjective/Complaints: Patient seen sitting up in bed this morning.  He states he slept well overnight.  He is more interactive and jovial this morning.  He states he was able to urinate last night.  ROS: Denies CP, SOB, N/V/D  Objective: Vital Signs: Blood pressure (!) 149/87, pulse 78, temperature 98.2 F (36.8 C), resp. rate 18, height 6' (1.829 m), weight 89.8 kg, SpO2 97 %. No results found. Recent Labs    03/15/20 0435  WBC 6.5  HGB 13.8  HCT 42.2  PLT 61*   No results for input(s): NA, K, CL, CO2, GLUCOSE, BUN, CREATININE, CALCIUM in the last 72 hours.  Physical Exam: BP (!) 149/87 (BP Location: Right Arm)   Pulse 78   Temp 98.2 F (36.8 C)   Resp 18   Ht 6' (1.829 m)   Wt 89.8 kg   SpO2 97%   BMI 26.85 kg/m  Constitutional: No distress . Vital signs reviewed. HENT: Normocephalic.  Atraumatic. Eyes: EOMI. No discharge. Cardiovascular: No JVD. Irregular rhythm.  Respiratory: Normal effort.  No stridor. GI: Non-distended. Skin: Warm and dry.  Intact. Psych: Slowed, improving. Musc: No edema in extremities.  No tenderness in extremities. Neuro: Alert HOH Right facial weakness, stable Dysarthria, stable Motor:  RUE: Shoulder abduction 4/5, distally 4/5 with apraxia, improving RLE: 4/5 proximal to distal with apraxia. Right inattention improving  Assessment/Plan: 1. Functional deficits secondary to left MCA/ACA infarcts which require 3+ hours per day of interdisciplinary therapy in a comprehensive inpatient rehab setting.  Physiatrist is providing close team supervision and 24 hour management of active medical problems listed below.  Physiatrist and rehab team continue to assess barriers to discharge/monitor patient progress toward functional and medical goals  Care Tool:  Bathing    Body parts bathed by patient: Right arm, Left arm, Chest, Abdomen, Front perineal area, Left upper leg, Face,  Right upper leg, Buttocks, Left lower leg (had difficulty reaching in small shower, needs long handled sponge)   Body parts bathed by helper: Buttocks Body parts n/a: Right lower leg, Left lower leg   Bathing assist Assist Level: Supervision/Verbal cueing     Upper Body Dressing/Undressing Upper body dressing Upper body dressing/undressing activity did not occur (including orthotics): N/A What is the patient wearing?: Pull over shirt    Upper body assist Assist Level: Set up assist    Lower Body Dressing/Undressing Lower body dressing    Lower body dressing activity did not occur: N/A What is the patient wearing?: Pants     Lower body assist Assist for lower body dressing: Contact Guard/Touching assist     Toileting Toileting Toileting Activity did not occur (Clothing management and hygiene only): N/A (no void or bm)  Toileting assist Assist for toileting: Contact Guard/Touching assist     Transfers Chair/bed transfer  Transfers assist     Chair/bed transfer assist level: Contact Guard/Touching assist     Locomotion Ambulation   Ambulation assist      Assist level: Contact Guard/Touching assist Assistive device: Walker-rolling Max distance: 64ft   Walk 10 feet activity   Assist  Walk 10 feet activity did not occur: Safety/medical concerns (fatigue)  Assist level: Contact Guard/Touching assist Assistive device: Walker-rolling   Walk 50 feet activity   Assist Walk 50 feet with 2 turns activity did not occur: Safety/medical concerns (fatigue)  Assist level: Contact Guard/Touching assist Assistive device: Walker-rolling    Walk 150 feet activity   Assist Walk  150 feet activity did not occur: Safety/medical concerns (fatigue)         Walk 10 feet on uneven surface  activity   Assist Walk 10 feet on uneven surfaces activity did not occur: Safety/medical concerns (fatigue)         Wheelchair     Assist Will patient use wheelchair  at discharge?: Yes Type of Wheelchair: Manual    Wheelchair assist level: Dependent - Patient 0%      Wheelchair 50 feet with 2 turns activity    Assist        Assist Level: Dependent - Patient 0%   Wheelchair 150 feet activity     Assist     Assist Level: Dependent - Patient 0%      Medical Problem List and Plan: 1. Right sided weakness with poor safety awareness and decrease in problem solving affecting mobility and ADLs secondary to Left ACA/MCA watershed infarct, small left basal ganglia and thalamic infarct, left parietal occipital infarct on 03/02/20.  Continued CIR 2. Antithrombotics: -DVT/anticoagulation:Pharmaceutical:Other (comment)--Eliquis. -antiplatelet therapy: N/A 3. Pain Management:N/A 4. Mood:LCSW to follow for evaluation and support. -antipsychotic agents: N/a 5. Neuropsych: This patientisnot fully capable of making decisions on hisown behalf. 6. Skin/Wound Care:Routine pressure relief measures. 7. Fluids/Electrolytes/Nutrition:Monitor I/Os 8. HTN: Monitor BP   Lasix/Entresto/aldactone resumed 06/14.   Coreg decreased on 6/25  Slightly labile, but relatively controlled on 6/25  Monitor with increased mobility 9. Chronic systolic CHF: Monitor Monitor for signs of overload. Continue Coreg bid, Lasix, Entresto,aldactone and Lipitor.   No ASA due to low platelets.    Filed Weights   03/14/20 0521 03/16/20 0641 03/17/20 0500  Weight: 81.4 kg 88.6 kg 89.8 kg   Stable on 6/25 10. Cirrhosis of the liver with chronic thrombocytopenia: Monitor for signs of bleeding or decompensation.   LFTs WNL on 6/15  Plts 61 on 6/23 11. Chronic SOB: Multifactorial--? OSA. On Trilegy for reactive airway disease. Duo nebs prn. Encourage IS.   Monitor with increased exertion  Stable 6/20. 12. Polysubstance abuse + Cocaine and THC on admit : Has been ongoing since 2015. Educate/encourage patient regarding life style changes.   13. Seizure disorder: Continue Keppra bid.  14. Impaired fasting glucose/Hyperglycemia: Hgb A1c- 5.2.   Blood glucose elevated on 6/18  Monitor with increased mobility 15.?  AKI on CKD stage III:   Cr.  1.51 on 6/21, labs ordered for Monday  Encourage fluids  Cont to monitor 16. Constipation  Bowel meds increased on 6/15, again on 6/16, decreased on 6/18, decreased again on 6/19  Will consider further increase again if necessary 17.  Macrocytosis  Vitamin B12/folate within normal limits 18. Urinary retention  Flomax increased on 6/23  PVRs showing retention  Appears to be improving 19.  Atrial fibrillation  Rate controlled, noted on recent ECG as well  LOS: 11 days A FACE TO FACE EVALUATION WAS PERFORMED  Hilliary Jock Karis Juba 03/17/2020, 10:42 AM

## 2020-03-18 ENCOUNTER — Inpatient Hospital Stay (HOSPITAL_COMMUNITY): Payer: Medicare HMO | Admitting: Occupational Therapy

## 2020-03-18 ENCOUNTER — Inpatient Hospital Stay (HOSPITAL_COMMUNITY): Payer: Medicare HMO | Admitting: Physical Therapy

## 2020-03-18 NOTE — Progress Notes (Signed)
[name] PHYSICAL MEDICINE & REHABILITATION PROGRESS NOTE  Subjective/Complaints:   Pt reports doing well- denies issues- bowel and bladder doing well- no pain- eating breakfast- most of it gone already.    ROS:  Pt denies SOB, abd pain, CP, N/V/C/D, and vision changes   Objective: Vital Signs: Blood pressure 120/88, pulse 73, temperature 98.5 F (36.9 C), temperature source Oral, resp. rate 19, height 6' (1.829 m), weight 89.7 kg, SpO2 97 %. No results found. No results for input(s): WBC, HGB, HCT, PLT in the last 72 hours. No results for input(s): NA, K, CL, CO2, GLUCOSE, BUN, CREATININE, CALCIUM in the last 72 hours.  Physical Exam: BP 120/88 (BP Location: Right Arm)   Pulse 73   Temp 98.5 F (36.9 C) (Oral)   Resp 19   Ht 6' (1.829 m)   Wt 89.7 kg   SpO2 97%   BMI 26.82 kg/m  Constitutional: No distress . Vital signs reviewed.sitting up eating breakfast, NAD HENT: Normocephalic.  Atraumatic. Eyes: EOMI. No discharge. Cardiovascular: No JVD. Irregular rhythm. unchanged  Respiratory: CTA B/L- no W/R/R- good air movement GI: Soft, NT, ND, (+)BS  Skin: Warm and dry.  Intact. Psych: slowed- but smiling. Musc: No edema in extremities.  No tenderness in extremities. Neuro: Alert HOH Right facial weakness, stable Dysarthria, stable Motor:  RUE: Shoulder abduction 4/5, distally 4/5 with apraxia, improving RLE: 4/5 proximal to distal with apraxia. Right inattention improving  Assessment/Plan: 1. Functional deficits secondary to left MCA/ACA infarcts which require 3+ hours per day of interdisciplinary therapy in a comprehensive inpatient rehab setting.  Physiatrist is providing close team supervision and 24 hour management of active medical problems listed below.  Physiatrist and rehab team continue to assess barriers to discharge/monitor patient progress toward functional and medical goals  Care Tool:  Bathing    Body parts bathed by patient: Right arm, Left  arm, Chest, Abdomen, Front perineal area, Left upper leg, Face, Right upper leg, Buttocks, Left lower leg (had difficulty reaching in small shower, needs long handled sponge)   Body parts bathed by helper: Buttocks Body parts n/a: Right lower leg, Left lower leg   Bathing assist Assist Level: Supervision/Verbal cueing     Upper Body Dressing/Undressing Upper body dressing Upper body dressing/undressing activity did not occur (including orthotics): N/A What is the patient wearing?: Pull over shirt    Upper body assist Assist Level: Set up assist    Lower Body Dressing/Undressing Lower body dressing    Lower body dressing activity did not occur: N/A What is the patient wearing?: Pants     Lower body assist Assist for lower body dressing: Contact Guard/Touching assist     Toileting Toileting Toileting Activity did not occur (Clothing management and hygiene only): N/A (no void or bm)  Toileting assist Assist for toileting: Contact Guard/Touching assist     Transfers Chair/bed transfer  Transfers assist     Chair/bed transfer assist level: Contact Guard/Touching assist     Locomotion Ambulation   Ambulation assist      Assist level: Supervision/Verbal cueing Assistive device: Walker-rolling Max distance: 19ft   Walk 10 feet activity   Assist  Walk 10 feet activity did not occur: Safety/medical concerns (fatigue)  Assist level: Supervision/Verbal cueing Assistive device: Walker-rolling   Walk 50 feet activity   Assist Walk 50 feet with 2 turns activity did not occur: Safety/medical concerns (fatigue)  Assist level: Supervision/Verbal cueing Assistive device: Walker-rolling    Walk 150 feet activity   Assist  Walk 150 feet activity did not occur: Safety/medical concerns (fatigue)         Walk 10 feet on uneven surface  activity   Assist Walk 10 feet on uneven surfaces activity did not occur: Safety/medical concerns (fatigue)          Wheelchair     Assist Will patient use wheelchair at discharge?:  (tbd) Type of Wheelchair: Manual    Wheelchair assist level: Supervision/Verbal cueing Max wheelchair distance: 131ft    Wheelchair 50 feet with 2 turns activity    Assist        Assist Level: Supervision/Verbal cueing   Wheelchair 150 feet activity     Assist     Assist Level: Supervision/Verbal cueing      Medical Problem List and Plan: 1. Right sided weakness with poor safety awareness and decrease in problem solving affecting mobility and ADLs secondary to Left ACA/MCA watershed infarct, small left basal ganglia and thalamic infarct, left parietal occipital infarct on 03/02/20.  Continued CIR 2. Antithrombotics: -DVT/anticoagulation:Pharmaceutical:Other (comment)--Eliquis. -antiplatelet therapy: N/A 3. Pain Management:N/A 4. Mood:LCSW to follow for evaluation and support. -antipsychotic agents: N/a 5. Neuropsych: This patientisnot fully capable of making decisions on hisown behalf. 6. Skin/Wound Care:Routine pressure relief measures. 7. Fluids/Electrolytes/Nutrition:Monitor I/Os 8. HTN: Monitor BP   Lasix/Entresto/aldactone resumed 06/14.   Coreg decreased on 6/25  Slightly labile, but relatively controlled on 6/25  6/26- BP 120/88- con't meds  Monitor with increased mobility 9. Chronic systolic CHF: Monitor Monitor for signs of overload. Continue Coreg bid, Lasix, Entresto,aldactone and Lipitor.   No ASA due to low platelets.    Filed Weights   03/16/20 0641 03/17/20 0500 03/18/20 0500  Weight: 88.6 kg 89.8 kg 89.7 kg   Stable on 6/26 10. Cirrhosis of the liver with chronic thrombocytopenia: Monitor for signs of bleeding or decompensation.   LFTs WNL on 6/15  Plts 61 on 6/23 11. Chronic SOB: Multifactorial--? OSA. On Trilegy for reactive airway disease. Duo nebs prn. Encourage IS.   Monitor with increased exertion  6/26- denies SOB 12.  Polysubstance abuse + Cocaine and THC on admit : Has been ongoing since 2015. Educate/encourage patient regarding life style changes.  13. Seizure disorder: Continue Keppra bid.  14. Impaired fasting glucose/Hyperglycemia: Hgb A1c- 5.2.   Blood glucose elevated on 6/18  Monitor with increased mobility 15.?  AKI on CKD stage III:   Cr.  1.51 on 6/21, labs ordered for Monday  Encourage fluids  Cont to monitor 16. Constipation  Bowel meds increased on 6/15, again on 6/16, decreased on 6/18, decreased again on 6/19  6/26- said no issues  Will consider further increase again if necessary 17.  Macrocytosis  Vitamin B12/folate within normal limits 18. Urinary retention  Flomax increased on 6/23  PVRs showing retention  Appears to be improving 19.  Atrial fibrillation  Rate controlled, noted on recent ECG as well  LOS: 12 days A FACE TO FACE EVALUATION WAS PERFORMED  Ariyana Faw 03/18/2020, 4:26 PM

## 2020-03-18 NOTE — Progress Notes (Signed)
Physical Therapy Session Note  Patient Details  Name: Kevin [name] MRN: [id number] Date of Birth: 1950-11-30  Today's Date: 03/18/2020 PT Individual Time: 1104-1200 PT Individual Time Calculation (min): 56 min   Short Term Goals: Week 2:  PT Short Term Goal 1 (Week 2): Patient to be able to perform all functional bed <-> chair transfers with S and LRAD PT Short Term Goal 2 (Week 2): Patient to tolerate gait training at least 71f with RW and no more than min guard PT Short Term Goal 3 (Week 2): Patient to be able to navigate single step with RW and no more than min guard PT Short Term Goal 4 (Week 2): Patient to be able to complete all bed mobility with min guard  Skilled Therapeutic Interventions/Progress Updates:    Patient received in bed, sleeping but easily woken and willing to participate in therapy session. Did require MinA to complete functional bed mobility today with extended time, then able to doff pajamas and don fresh clothing with min guard with occasional cues for safety, especially needed assist with socks due to difficulty with hip stiffness while sitting at EOB. Able to transfer from bed <-> chair and chair<-> Nustep with close min guard with B knee flexion but no buckling. Tolerated riding Nustep for 6 minutes with BLEs on resistance 3 for reciprocal movement and BLE strength/functional activity tolerance. Followed this immediately with gait training, able to gait train [id number] with RW/close S and cues for improved foot clearance due to ongoing toe catching and scuffing; gait distance limited due to fatigue today. Finished session with WC pulls [id number] hamstring, quad, and ankle dorsiflexor activation. [facility], chair alarm active.   Therapy Documentation Precautions:  Precautions Precautions: Fall Precaution Comments: fatigues VERY easily, need to watch O2 sats, R hemiplegia and neglect, impaired cognition Restrictions Weight  Bearing Restrictions: No   Pain: Pain Assessment Pain Scale: 0-10 Pain Score: 0-No pain    Therapy/Group: Individual Therapy   KrWindell NorfolkDPT, PN1   Supplemental Physical Therapist [facility]  Pager [phone] Office [phone]  03/18/2020, 12:42 PM

## 2020-03-18 NOTE — Progress Notes (Signed)
Occupational Therapy Session Note  Patient Details  Name: Kevin [name] MRN: [id number] Date of Birth: [birth year]  Today's Date: 03/18/2020 OT Individual Time: 8250-5397 OT Individual Time Calculation (min): 60 min    Short Term Goals: Week 1:  OT Short Term Goal 1 (Week 1): Pt will donn/doff shirt with min assist OT Short Term Goal 1 - Progress (Week 1): Met OT Short Term Goal 2 (Week 1): Pt will bathe LB with mod assist OT Short Term Goal 2 - Progress (Week 1): Met OT Short Term Goal 3 (Week 1): Pt will complete toilet transfer using BSC with min assist OT Short Term Goal 3 - Progress (Week 1): Met OT Short Term Goal 4 (Week 1): Pt will complete LB dressing with mod assist. OT Short Term Goal 4 - Progress (Week 1): Met Week 2:  OT Short Term Goal 1 (Week 2): Pt will complete UB/LB bathing in tub shower in sitting and standing with supervision. OT Short Term Goal 2 (Week 2): Pt will complete tub shower transfer with supervision using least restrictive AD. OT Short Term Goal 3 (Week 2): Pt will complete toilet transfer with supervision. OT Short Term Goal 4 (Week 2): Pt will complete LB dressing with supervision.  Skilled Therapeutic Interventions/Progress Updates:    Pt seen this session for RUE NMR focusing on shoulder and grasp/finger strength.   Pt used graded resistive clothespins and he was able to do all levels without difficulty.  Pt worked on a/arom of sh flexion. Pt able to reach R arm up to 160 degrees actively and has full PROM to 180.   Used 5 lb hand weight for forearm rotation, wrist extension and cross punches.    Asked pt about his medical history and substance abuse history. Pt was very receptive to discussing his past use and how to abstain from future use.  Discussed his reasons for using drugs and how he needs to adjust his lifestyle to decrease temptation. I recommended pt seek out counseling to help him with these issues.  Pt stated he really appreciated  discussing this.   Pt resting in bed with all needs met.  Bed alarm set.   Therapy Documentation Precautions:  Precautions Precautions: Fall Precaution Comments: fatigues VERY easily, need to watch O2 sats, R hemiplegia and neglect, impaired cognition Restrictions Weight Bearing Restrictions: No  Pain: Pain Assessment Pain Scale: 0-10 Pain Score: 0-No pain ADL: ADL Eating: Moderate cueing, Set up, Supervision/safety Where Assessed-Eating: Bed level Grooming: Minimal assistance Where Assessed-Grooming: Sitting at sink Upper Body Bathing: Moderate assistance Where Assessed-Upper Body Bathing: Sitting at sink Lower Body Bathing: Maximal assistance Where Assessed-Lower Body Bathing: Sitting at sink Upper Body Dressing: Moderate assistance Where Assessed-Upper Body Dressing: Sitting at sink Lower Body Dressing: Moderate assistance Where Assessed-Lower Body Dressing: Standing at sink, Sitting at sink Toileting: Maximal assistance Where Assessed-Toileting: Bedside Commode Toilet Transfer: Moderate assistance Toilet Transfer Method: Stand pivot Toilet Transfer Equipment: Engineer, technical sales Transfer: Not assessed   Therapy/Group: Individual Therapy  Marianne 03/18/2020, 2:26 PM

## 2020-03-19 MED ORDER — SORBITOL 70 % SOLN: 30.0000 mL | Freq: Every day | Status: DC | PRN

## 2020-03-19 MED ORDER — POLYETHYLENE GLYCOL 3350 17 G PO PACK
17.0000 g | PACK | Freq: Every day | ORAL | Status: DC | PRN
  Administered 2020-03-19: 17 g via ORAL

## 2020-03-19 NOTE — Progress Notes (Signed)
Pt refuses to wear CPAP.  °

## 2020-03-19 NOTE — Progress Notes (Signed)
[facility] PHYSICAL MEDICINE & REHABILITATION PROGRESS NOTE  Subjective/Complaints:   Pt reports didn't sleep great- wants to go back to sleep- is doing "ok"-  CPAP at bedside   ROS:  Unable to assess due to sedation/cognition  Objective: Vital Signs: Blood pressure (!) 124/105, pulse 82, temperature 98.2 F (36.8 C), resp. rate 18, height 6' (1.829 m), weight 88.9 kg, SpO2 100 %. No results found. No results for input(s): WBC, HGB, HCT, PLT in the last 72 hours. No results for input(s): NA, K, CL, CO2, GLUCOSE, BUN, CREATININE, CALCIUM in the last 72 hours.  Physical Exam: BP (!) 124/105 (BP Location: Right Arm)   Pulse 82   Temp 98.2 F (36.8 C)   Resp 18   Ht 6' (1.829 m)   Wt 88.9 kg   SpO2 100%   BMI 26.58 kg/m  Constitutional: asleep- woke for short period due to tactile stimulation, NAD HENT: Normocephalic.  Atraumatic. Eyes: EOMI. No discharge. Cardiovascular: No JVD. Irregular rhythm.unchanged- rate controlled Respiratory: CTA B/L- no W/R/R- good air movement GI: Soft, NT, ND, (+)BS  Skin: Warm and dry.  Intact. Psych: slowed, sleepy Musc: No edema in extremities.  No tenderness in extremities. Neuro: Alert HOH Right facial weakness, stable Dysarthria, stable Motor:  RUE: Shoulder abduction 4/5, distally 4/5 with apraxia, improving RLE: 4/5 proximal to distal with apraxia. Right inattention improving  Assessment/Plan: 1. Functional deficits secondary to left MCA/ACA infarcts which require 3+ hours per day of interdisciplinary therapy in a comprehensive inpatient rehab setting.  Physiatrist is providing close team supervision and 24 hour management of active medical problems listed below.  Physiatrist and rehab team continue to assess barriers to discharge/monitor patient progress toward functional and medical goals  Care Tool:  Bathing    Body parts bathed by patient: Right arm, Left arm, Chest, Abdomen, Front perineal area, Left upper leg, Face,  Right upper leg, Buttocks, Left lower leg (had difficulty reaching in small shower, needs long handled sponge)   Body parts bathed by helper: Buttocks Body parts n/a: Right lower leg, Left lower leg   Bathing assist Assist Level: Supervision/Verbal cueing     Upper Body Dressing/Undressing Upper body dressing Upper body dressing/undressing activity did not occur (including orthotics): N/A What is the patient wearing?: Pull over shirt    Upper body assist Assist Level: Set up assist    Lower Body Dressing/Undressing Lower body dressing    Lower body dressing activity did not occur: N/A What is the patient wearing?: Pants     Lower body assist Assist for lower body dressing: Contact Guard/Touching assist     Toileting Toileting Toileting Activity did not occur (Clothing management and hygiene only): N/A (no void or bm)  Toileting assist Assist for toileting: Contact Guard/Touching assist     Transfers Chair/bed transfer  Transfers assist     Chair/bed transfer assist level: Contact Guard/Touching assist     Locomotion Ambulation   Ambulation assist      Assist level: Supervision/Verbal cueing Assistive device: Walker-rolling Max distance: 69ft   Walk 10 feet activity   Assist  Walk 10 feet activity did not occur: Safety/medical concerns (fatigue)  Assist level: Supervision/Verbal cueing Assistive device: Walker-rolling   Walk 50 feet activity   Assist Walk 50 feet with 2 turns activity did not occur: Safety/medical concerns (fatigue)  Assist level: Supervision/Verbal cueing Assistive device: Walker-rolling    Walk 150 feet activity   Assist Walk 150 feet activity did not occur: Safety/medical concerns (fatigue)  Walk 10 feet on uneven surface  activity   Assist Walk 10 feet on uneven surfaces activity did not occur: Safety/medical concerns (fatigue)         Wheelchair     Assist Will patient use wheelchair at  discharge?:  (tbd) Type of Wheelchair: Manual    Wheelchair assist level: Supervision/Verbal cueing Max wheelchair distance: 142ft    Wheelchair 50 feet with 2 turns activity    Assist        Assist Level: Supervision/Verbal cueing   Wheelchair 150 feet activity     Assist     Assist Level: Supervision/Verbal cueing      Medical Problem List and Plan: 1. Right sided weakness with poor safety awareness and decrease in problem solving affecting mobility and ADLs secondary to Left ACA/MCA watershed infarct, small left basal ganglia and thalamic infarct, left parietal occipital infarct on 03/02/20.  Continued CIR 2. Antithrombotics: -DVT/anticoagulation:Pharmaceutical:Other (comment)--Eliquis. -antiplatelet therapy: N/A 3. Pain Management:N/A 4. Mood:LCSW to follow for evaluation and support. -antipsychotic agents: N/a 5. Neuropsych: This patientisnot fully capable of making decisions on hisown behalf. 6. Skin/Wound Care:Routine pressure relief measures. 7. Fluids/Electrolytes/Nutrition:Monitor I/Os 8. HTN: Monitor BP   Lasix/Entresto/aldactone resumed 06/14.   Coreg decreased on 6/25  Slightly labile, but relatively controlled on 6/25  6/26- BP 120/88- con't meds  6/27- 130s/100s- con't regimen- is 1 day DBP elevated- con't to monitor  Monitor with increased mobility 9. Chronic systolic CHF: Monitor Monitor for signs of overload. Continue Coreg bid, Lasix, Entresto,aldactone and Lipitor.   No ASA due to low platelets.    Filed Weights   03/17/20 0500 03/18/20 0500 03/19/20 0500  Weight: 89.8 kg 89.7 kg 88.9 kg   6/27- down slightly- con't regimen 10. Cirrhosis of the liver with chronic thrombocytopenia: Monitor for signs of bleeding or decompensation.   LFTs WNL on 6/15  Plts 61 on 6/23 11. Chronic SOB: Multifactorial--? OSA. On Trilegy for reactive airway disease. Duo nebs prn. Encourage IS.   Monitor with increased  exertion  6/26- denies SOB 12. Polysubstance abuse + Cocaine and THC on admit : Has been ongoing since 2015. Educate/encourage patient regarding life style changes.  13. Seizure disorder: Continue Keppra bid.  14. Impaired fasting glucose/Hyperglycemia: Hgb A1c- 5.2.   Blood glucose elevated on 6/18  Monitor with increased mobility 15.?  AKI on CKD stage III:   Cr.  1.51 on 6/21, labs ordered for Monday  Encourage fluids  Cont to monitor 16. Constipation  Bowel meds increased on 6/15, again on 6/16, decreased on 6/18, decreased again on 6/19  6/26- said no issues  Will consider further increase again if necessary 17.  Macrocytosis  Vitamin B12/folate within normal limits 18. Urinary retention  Flomax increased on 6/23  PVRs showing retention  Appears to be improving 19.  Atrial fibrillation  Rate controlled, noted on recent ECG as well  LOS: 13 days A FACE TO FACE EVALUATION WAS PERFORMED  Kevin [name] 03/19/2020, 2:58 PM

## 2020-03-20 ENCOUNTER — Inpatient Hospital Stay (HOSPITAL_COMMUNITY): Payer: Medicare HMO | Admitting: Occupational Therapy

## 2020-03-20 ENCOUNTER — Inpatient Hospital Stay (HOSPITAL_COMMUNITY): Payer: Medicare HMO | Admitting: Speech Pathology

## 2020-03-20 ENCOUNTER — Inpatient Hospital Stay (HOSPITAL_COMMUNITY): Payer: Medicare HMO

## 2020-03-20 LAB — BASIC METABOLIC PANEL
Anion gap: 7 (ref 5–15)
BUN: 17 mg/dL (ref 8–23)
CO2: 24 mmol/L (ref 22–32)
Calcium: 8.3 mg/dL — ABNORMAL LOW (ref 8.9–10.3)
Chloride: 107 mmol/L (ref 98–111)
Creatinine, Ser: 1.38 mg/dL — ABNORMAL HIGH (ref 0.61–1.24)
GFR calc Af Amer: >60 mL/min (ref >60)
GFR calc non Af Amer: 52 mL/min — ABNORMAL LOW (ref >60)
Glucose, Bld: 118 mg/dL — ABNORMAL HIGH (ref 70–99)
Potassium: 3.8 mmol/L (ref 3.5–5.1)
Sodium: 138 mmol/L (ref 135–145)

## 2020-03-20 NOTE — Progress Notes (Signed)
Pt refusing to wear cpap.  

## 2020-03-20 NOTE — Progress Notes (Signed)
[facility] PHYSICAL MEDICINE & REHABILITATION PROGRESS NOTE  Subjective/Complaints: Patient seen laying in bed this AM.  He states he slept well overnight. He states he had a good weekend.   ROS: Denies CP, SOB, N/V/D  Objective: Vital Signs: Blood pressure (!) 142/109, pulse 75, temperature 98.2 F (36.8 C), temperature source Oral, resp. rate 18, height 6' (1.829 m), weight 88.9 kg, SpO2 96 %. No results found. No results for input(s): WBC, HGB, HCT, PLT in the last 72 hours. Recent Labs    03/20/20 0644  NA 138  K 3.8  CL 107  CO2 24  GLUCOSE 118*  BUN 17  CREATININE 1.38*  CALCIUM 8.3*    Physical Exam: BP (!) 142/109 (BP Location: Left Arm)   Pulse 75   Temp 98.2 F (36.8 C) (Oral)   Resp 18   Ht 6' (1.829 m)   Wt 88.9 kg   SpO2 96%   BMI 26.58 kg/m   Constitutional: No distress . Vital signs reviewed. HENT: Normocephalic.  Atraumatic. Eyes: EOMI. No discharge. Cardiovascular: No JVD. Respiratory: Normal effort.  No stridor. GI: Non-distended. Skin: Warm and dry.  Intact. Psych: Normal mood.  Normal behavior. Musc: No edema in extremities.  No tenderness in extremities. Neuro: Alert HOH Right facial weakness, stable Dysarthria, unchanged Motor:  RUE: Shoulder abduction 4/5, distally 4/5 with apraxia, improving RLE: 4/5 proximal to distal with apraxia. Right inattention improving  Assessment/Plan: 1. Functional deficits secondary to left MCA/ACA infarcts which require 3+ hours per day of interdisciplinary therapy in a comprehensive inpatient rehab setting.  Physiatrist is providing close team supervision and 24 hour management of active medical problems listed below.  Physiatrist and rehab team continue to assess barriers to discharge/monitor patient progress toward functional and medical goals  Care Tool:  Bathing    Body parts bathed by patient: Right arm, Left arm, Chest, Abdomen, Front perineal area, Left upper leg, Face, Right upper leg,  Buttocks, Left lower leg, Right lower leg   Body parts bathed by helper: Buttocks Body parts n/a: Right lower leg, Left lower leg   Bathing assist Assist Level: Supervision/Verbal cueing     Upper Body Dressing/Undressing Upper body dressing Upper body dressing/undressing activity did not occur (including orthotics): N/A What is the patient wearing?: Pull over shirt    Upper body assist Assist Level: Set up assist    Lower Body Dressing/Undressing Lower body dressing    Lower body dressing activity did not occur: N/A What is the patient wearing?: Underwear/pull up, Pants     Lower body assist Assist for lower body dressing: Contact Guard/Touching assist     Toileting Toileting Toileting Activity did not occur (Clothing management and hygiene only): N/A (no void or bm)  Toileting assist Assist for toileting: Contact Guard/Touching assist     Transfers Chair/bed transfer  Transfers assist     Chair/bed transfer assist level: Contact Guard/Touching assist     Locomotion Ambulation   Ambulation assist      Assist level: Supervision/Verbal cueing Assistive device: Walker-rolling Max distance: 62ft   Walk 10 feet activity   Assist  Walk 10 feet activity did not occur: Safety/medical concerns (fatigue)  Assist level: Supervision/Verbal cueing Assistive device: Walker-rolling   Walk 50 feet activity   Assist Walk 50 feet with 2 turns activity did not occur: Safety/medical concerns (fatigue)  Assist level: Supervision/Verbal cueing Assistive device: Walker-rolling    Walk 150 feet activity   Assist Walk 150 feet activity did not occur: Safety/medical  concerns (fatigue)         Walk 10 feet on uneven surface  activity   Assist Walk 10 feet on uneven surfaces activity did not occur: Safety/medical concerns (fatigue)         Wheelchair     Assist Will patient use wheelchair at discharge?:  (tbd) Type of Wheelchair: Manual     Wheelchair assist level: Supervision/Verbal cueing Max wheelchair distance: 160ft    Wheelchair 50 feet with 2 turns activity    Assist        Assist Level: Supervision/Verbal cueing   Wheelchair 150 feet activity     Assist     Assist Level: Supervision/Verbal cueing      Medical Problem List and Plan: 1. Right sided weakness with poor safety awareness and decrease in problem solving affecting mobility and ADLs secondary to Left ACA/MCA watershed infarct, small left basal ganglia and thalamic infarct, left parietal occipital infarct on 03/02/20.  Continue CIR 2. Antithrombotics: -DVT/anticoagulation:Pharmaceutical:Other (comment)--Eliquis. -antiplatelet therapy: N/A 3. Pain Management:N/A 4. Mood:LCSW to follow for evaluation and support. -antipsychotic agents: N/a 5. Neuropsych: This patientisnot fully capable of making decisions on hisown behalf. 6. Skin/Wound Care:Routine pressure relief measures. 7. Fluids/Electrolytes/Nutrition:Monitor I/Os 8. HTN: Monitor BP   Lasix/Entresto/aldactone resumed 06/14.   Coreg decreased on 6/25  Labile, particularly diastolic pressures on 9/21, monitor for trend  Monitor with increased mobility 9. Chronic systolic CHF: Monitor Monitor for signs of overload. Continue Coreg bid, Lasix, Entresto,aldactone and Lipitor.   No ASA due to low platelets.    Filed Weights   03/17/20 0500 03/18/20 0500 03/19/20 0500  Weight: 89.8 kg 89.7 kg 88.9 kg   Stable on 6/28 10. Cirrhosis of the liver with chronic thrombocytopenia: Monitor for signs of bleeding or decompensation.   LFTs WNL on 6/15  Plts 61 on 6/23 11. Chronic SOB: Multifactorial--? OSA. On Trilegy for reactive airway disease. Duo nebs prn. Encourage IS.   Monitor with increased exertion 12. Polysubstance abuse + Cocaine and THC on admit : Has been ongoing since 2015. Educate/encourage patient regarding life style changes.  13.  Seizure disorder: Continue Keppra bid.   No breakthrough seizures since admission to rehab 14. Impaired fasting glucose/Hyperglycemia: Hgb A1c- 5.2.   Blood glucose mildly elevated on 6/28  Monitor with increased mobility 15.?  AKI on CKD stage III:   Cr.  1.38 on 6/28  Encourage fluids  Cont to monitor 16. Constipation  Bowel meds increased on 6/15, again on 6/16, decreased on 6/18, decreased again on 6/19  Improving  Will consider further increase again if necessary 17.  Macrocytosis  Vitamin B12/folate within normal limits 18. Urinary retention  Flomax increased on 6/23  PVRs showing retention  Improving overall 19.  Atrial fibrillation  Rate controlled, noted on recent ECG as well  LOS: 14 days A FACE TO FACE EVALUATION WAS PERFORMED  Dontrell Stuck Lorie Phenix 03/20/2020, 1:58 PM

## 2020-03-20 NOTE — Progress Notes (Signed)
Occupational Therapy Session Note  Patient Details  Name: Kevin [name] MRN: [id number] Date of Birth: 04/09/1951  Today's Date: 03/20/2020 OT Individual Time: 1040-1140 OT Individual Time Calculation (min): 60 min    Short Term Goals: Week 1:  OT Short Term Goal 1 (Week 1): Pt will donn/doff shirt with min assist OT Short Term Goal 1 - Progress (Week 1): Met OT Short Term Goal 2 (Week 1): Pt will bathe LB with mod assist OT Short Term Goal 2 - Progress (Week 1): Met OT Short Term Goal 3 (Week 1): Pt will complete toilet transfer using BSC with min assist OT Short Term Goal 3 - Progress (Week 1): Met OT Short Term Goal 4 (Week 1): Pt will complete LB dressing with mod assist. OT Short Term Goal 4 - Progress (Week 1): Met Week 2:  OT Short Term Goal 1 (Week 2): Pt will complete UB/LB bathing in tub shower in sitting and standing with supervision. OT Short Term Goal 2 (Week 2): Pt will complete tub shower transfer with supervision using least restrictive AD. OT Short Term Goal 3 (Week 2): Pt will complete toilet transfer with supervision. OT Short Term Goal 4 (Week 2): Pt will complete LB dressing with supervision.  Skilled Therapeutic Interventions/Progress Updates:    Pt seen for ADL training along with family education with his spouse.  Demonstrated to spouse how pt goes through all his steps of self care including a shower. Overall pt completing all self care at a S to CGA level needing a few rest breaks.   See ADL documentation below for details. Education on where she needs to position herself and how to cue him if needed with safe positioning for sit >< stands and standing balance.  At end of session, pt resting in wc. He was more fatigued today and did seem to get out of breath easily.  He did not use his CPAP last night.   While pt was resting in w.c, demonstrated to his wife how a tub bench is used in a tub (curtain placement, leg adjustment) and how a suction cup grab bar  works.  She will obtain the bar and bench on her own.  He will need a BSC due to urinary urgency at night and it will not be safe for him to walk to the bathroom at night.   Pt resting in wc with seat alarm on and all needs met.  Therapy Documentation Precautions:  Precautions Precautions: Fall Precaution Comments: fatigues VERY easily, need to watch O2 sats, R hemiplegia and neglect, impaired cognition Restrictions Weight Bearing Restrictions: No    Vital Signs: Oxygen Therapy SpO2: 96 % O2 Device: Room Air Pain: Pain Assessment Pain Scale: 0-10 Pain Score: 0-No pain ADL: ADL Eating: Set up Where Assessed-Eating: Wheelchair Grooming: Setup Where Assessed-Grooming: Wheelchair Upper Body Bathing: Supervision/safety Where Assessed-Upper Body Bathing: Shower Lower Body Bathing: Supervision/safety Where Assessed-Lower Body Bathing: Shower Upper Body Dressing: Setup Where Assessed-Upper Body Dressing: Edge of bed Lower Body Dressing: Contact guard Where Assessed-Lower Body Dressing: Edge of bed Toileting: Contact guard Where Assessed-Toileting: Toilet Toilet Transfer: Contact guard Toilet Transfer Method: Ambulating Toilet Transfer Equipment: Bedside commode Tub/Shower Transfer: Not assessed Walk-In Shower Transfer: Contact guard Walk-In Shower Transfer Method: Ambulating Walk-In Shower Equipment: Transfer tub bench   Therapy/Group: Individual Therapy  SAGUIER,JULIA 03/20/2020, 12:34 PM 

## 2020-03-20 NOTE — Progress Notes (Signed)
Physical Therapy Session Note  Patient Details  Name: Kevin [name] MRN: [id number] Date of Birth: Feb 11, 1951  Today's Date: 03/20/2020 PT Individual Time: 1415-1500 PT Individual Time Calculation (min): 45 min   Short Term Goals: Week 1:  PT Short Term Goal 1 (Week 1): Patient to be able to perform bed to chair transfers with ModA PT Short Term Goal 1 - Progress (Week 1): Met PT Short Term Goal 2 (Week 1): Patient to be able to propel WC 77f with ModA PT Short Term Goal 2 - Progress (Week 1): Progressing toward goal PT Short Term Goal 3 (Week 1): Patient to be able to maintain standing for 3-4 minutes in stedy without fatigue PT Short Term Goal 3 - Progress (Week 1): Progressing toward goal PT Short Term Goal 4 (Week 1): Patient to be able to gait train 564fwith maxA and WC follow PT Short Term Goal 4 - Progress (Week 1): Met Week 2:  PT Short Term Goal 1 (Week 2): Patient to be able to perform all functional bed <-> chair transfers with S and LRAD PT Short Term Goal 2 (Week 2): Patient to tolerate gait training at least 8026fith RW and no more than min guard PT Short Term Goal 3 (Week 2): Patient to be able to navigate single step with RW and no more than min guard PT Short Term Goal 4 (Week 2): Patient to be able to complete all bed mobility with min guard Week 3:     Skilled Therapeutic Interventions/Progress Updates:    PAIN denies pain   Gait [id number] and cga/min A w/single episode due to stumbling, cues to attend to RLE, decreased clearance RLE thru swing due to inattention w/gait.  Able to clear fully when attending to task but attention shortlived.  wc propulsion 115ft53f w/bilat LEs again w/cues to attend to R, to achieve heelstrike R, short attention to task.  Standing in parallel bars - tapping 5 in step w/R heel and stepping back x 10 reps w/cues to fully clear step w/return requiring focused attention to task, several cues required. Repeated above x 30reps  w/brief rest in standing at intervals of 10  Gait 90ft 74f initially w/heelstrike attained x 10 steps, then w/distraction impaired clearance w/reslutant "stumbling" and forward LOB w/min to mod assist for recovery,  w/mult cues required to improve clearance.  Turn/sit to edge of bed w/cga and cues for safety.  Sit to supine w/supervision.  Discussed inattention as primary limiting factor w/wife, continued need for 24 hr assist due to this/risk of falls.   Biggest limitation to safety w/gait/mobility is R inattention.  2 episodes of "stumbling" during gait due to incomplete clearance.  Pt left supine w/rails up x 3, alarm set, bed in lowest position, and needs in reach. Wife at bedside.    Therapy Documentation Precautions:  Precautions Precautions: Fall Precaution Comments: fatigues VERY easily, need to watch O2 sats, R hemiplegia and neglect, impaired cognition Restrictions Weight Bearing Restrictions: No    Therapy/Group: Individual Therapy  BarbarCallie Fielding [email or web address], 3:14 PM

## 2020-03-20 NOTE — Progress Notes (Signed)
Speech Language Pathology Daily Session Note  Patient Details  Name: Kevin [name] MRN: [id number] Date of Birth: [birth year]  Today's Date: 03/20/2020 SLP Individual Time: 0931-1000 SLP Individual Time Calculation (min): 29 min  Short Term Goals: Week 2: SLP Short Term Goal 1 (Week 2): Pt will sustain attention to functional tasks for 10 minute intervals with Min A cues for redirection. SLP Short Term Goal 2 (Week 2): Pt will demonsrate ability to problem solve in basic familiar functional situations with Mod A verbal/visual cues. SLP Short Term Goal 3 (Week 2): Pt will identify 1 physical and 1 cognitive impairment with Max A verbal/visual cues. SLP Short Term Goal 4 (Week 2): Pt will recall new and/or daily information with Min A verbal/visual cues for use of aids or other compensatory memory strategies. SLP Short Term Goal 5 (Week 2): Pt will follow 2-step directions with 50% accuracy provided Max A verbal and visual cues.  Skilled Therapeutic Interventions: Pt was seen for skilled ST targeting education with pt's wife as well as progress toward cognitive goals. SLP facilitated session with basic money management task, which pt required Min A verbal and visual cues for problem solving and error awareness, primarily when totaling and calculating change (addition and subtraction). Performance on this task much improved since last session in which it was targeted. Provided education for wife regarding goals and progress in ST this admission. She reports baseline deficits in problem solving, however also acknowledges exacerbation of deficits since acute CVA. Discussed activities to target cognitive rehabilitation at home, as well as recommendation to assist with medication and finance management (which she reports she did at baseline after pt's 1st CVA). Also discussed auditory processing delay, and strategies such as reducing distractions, keeping messages simple, speaking at a slower rate and  repetition. Pt left laying in bed with alarm set and needs within reach, wife still present and all questions answered to her satisfaction at this time. Continue per current plan of care.        Pain Pain Assessment Pain Scale: 0-10 Pain Score: 0-No pain  Therapy/Group: Individual Therapy  [name] 03/20/2020, 7:24 AM

## 2020-03-20 NOTE — Progress Notes (Signed)
Occupational Therapy Session Note  Patient Details  Name: Kevin [name] MRN: [id number] Date of Birth: [birth year]  Today's Date: 03/20/2020 OT Individual Time: 1305-1403 OT Individual Time Calculation (min): 58 min    Short Term Goals: Week 2:  OT Short Term Goal 1 (Week 2): Pt will complete UB/LB bathing in tub shower in sitting and standing with supervision. OT Short Term Goal 2 (Week 2): Pt will complete tub shower transfer with supervision using least restrictive AD. OT Short Term Goal 3 (Week 2): Pt will complete toilet transfer with supervision. OT Short Term Goal 4 (Week 2): Pt will complete LB dressing with supervision.  Skilled Therapeutic Interventions/Progress Updates:   Pt sitting up in w/c with wife in room upon OT arrival.  Pt c/o tightness and soreness in left calf at end of session, on FACE scale: 2/10 pain.  Pt requesting to complete RUE strengthening with wife present.  Instructed pt through RUE therex using medium resistive band to complete rows, biceps, and AROM shoulder flexion, and grip with resistive ball.  Pt completed 3 sets x 10-15 reps with min intermittent VCs, visual demo, and TCs to improve form and increase effort on right side.  Pt then ambulated using RW from room to Day Room with CGA without RBs needed and no LOB noted throughout.  Pt participated in endurance and strength training using recumbent bicycle with UE/LE cycling on level 4 x 10 minutes.  Pt rated PRE during cycling at 12-13/20.  Pt completed sit <>stand at bicycle and arm chair with CGA.  Dynamic standing balance with RUE functional reach component grasp and toss bean bags completed with pt needing CGA.  Pt having difficulty producing enough force to through bean bags required distance.  Dependent transport back to room completed secondary to time constraint.  Call bell in reach and seat alarm on.   Therapy Documentation Precautions:  Precautions Precautions: Fall Precaution Comments: fatigues  VERY easily, need to watch O2 sats, R hemiplegia and neglect, impaired cognition Restrictions Weight Bearing Restrictions: No   Therapy/Group: Individual Therapy  Amie Critchley 03/20/2020, 4:48 PM

## 2020-03-21 ENCOUNTER — Inpatient Hospital Stay (HOSPITAL_COMMUNITY): Payer: Medicare HMO | Admitting: Speech Pathology

## 2020-03-21 ENCOUNTER — Inpatient Hospital Stay (HOSPITAL_COMMUNITY): Payer: Medicare HMO

## 2020-03-21 ENCOUNTER — Inpatient Hospital Stay (HOSPITAL_COMMUNITY): Payer: Medicare HMO | Admitting: Physical Therapy

## 2020-03-21 ENCOUNTER — Inpatient Hospital Stay (HOSPITAL_COMMUNITY): Payer: Medicare HMO | Admitting: Occupational Therapy

## 2020-03-21 DIAGNOSIS — N319 Neuromuscular dysfunction of bladder, unspecified: Secondary | ICD-10-CM

## 2020-03-21 NOTE — Progress Notes (Signed)
Speech Language Pathology Weekly Progress and Session Note  Patient Details  Name: Kevin [name] MRN: [id number] Date of Birth: Aug 19, 1951  Beginning of progress report period: March 14, 2020 End of progress report period: March 21, 2020  Today's Date: 03/21/2020 SLP Individual Time: 9191-6606 SLP Individual Time Calculation (min): 57 min  Short Term Goals: Week 2: SLP Short Term Goal 1 (Week 2): Pt will sustain attention to functional tasks for 10 minute intervals with Min A cues for redirection. SLP Short Term Goal 1 - Progress (Week 2): Met SLP Short Term Goal 2 (Week 2): Pt will demonsrate ability to problem solve in basic familiar functional situations with Mod A verbal/visual cues. SLP Short Term Goal 2 - Progress (Week 2): Met SLP Short Term Goal 3 (Week 2): Pt will identify 1 physical and 1 cognitive impairment with Max A verbal/visual cues. SLP Short Term Goal 3 - Progress (Week 2): Met SLP Short Term Goal 4 (Week 2): Pt will recall new and/or daily information with Min A verbal/visual cues for use of aids or other compensatory memory strategies. SLP Short Term Goal 4 - Progress (Week 2): Met SLP Short Term Goal 5 (Week 2): Pt will follow 2-step directions with 50% accuracy provided Max A verbal and visual cues. SLP Short Term Goal 5 - Progress (Week 2): Met    New Short Term Goals: Week 3: SLP Short Term Goal 1 (Week 3): STG=LTG due to remaining length of stay  Weekly Progress Updates: Pt has made functional gains and met 5 out of 5 short term goals. Pt is currently ~Min-Mod assist for basic tasks due to cognitive impairments impacting his basic problem solving, short term memory, selective attention, and emergent awareness. Pt also with mild deficits in auditory comprehension, which is further impacted by a mild hearing loss. Pt has demonstrated improved basic problem solving, sustained attention, and ability to follow multi-step directions with appropriate cueing. Pt and  family education is ongoing. Pt would continue to benefit from skilled ST while inpatient in order to maximize functional independence and reduce burden of care prior to discharge. Anticipate that pt will need 24/7 supervision at discharge in addition to [place] follow up at next level of care.     Intensity: Minumum of 1-2 x/day, 30 to 90 minutes Frequency: 3 to 5 out of 7 days Duration/Length of Stay: 03/28/20 Treatment/Interventions: Cognitive remediation/compensation;Cueing hierarchy;Functional tasks;Patient/family education;Therapeutic Activities;Internal/external aids;Multimodal communication approach   Daily Session  Skilled Therapeutic Interventions: Pt was seen for skilled ST targeting cognitive goals. SLP facilitated session session with Min-Mod A verbal and visual cues for comprehension, problem solving, and error awareness during a semi-complex monthly calendar/scheduling task. Min A was required for scheduling basic appointments, however increased Mod A cueing was required for interpretation od key details and problem solving when scheduling tasks with more semi-complex language. Accommodations provided for handwriting during task. Pt required Mod A verbal cues for intellectual awareness of task difficulty at end of session. Pt sustained attention eith only Supervision A verbal cues throughout session.  In functional conversation regarding current level of functioning and progress in therapy, pt able to identify 1 physical impairment and 1 cognitive impairment with overall Mod A question cues. Pt left sitting in bed with alarm set and needs within reach. Continue per current plan of care.        Pain Pain Assessment Pain Scale: 0-10 Pain Score: 0-No pain  Therapy/Group: Individual Therapy  Arbutus Leas 03/21/2020, 7:04 AM

## 2020-03-21 NOTE — Progress Notes (Signed)
Physical Therapy Session Note  Patient Details  Name: Kevin [name] MRN: [id number] Date of Birth: 02/13/1951  Today's Date: 03/21/2020 PT Individual Time: 1303-1400 PT Individual Time Calculation (min): 57 min   Short Term Goals: Week 2:  PT Short Term Goal 1 (Week 2): Patient to be able to perform all functional bed <-> chair transfers with S and LRAD PT Short Term Goal 2 (Week 2): Patient to tolerate gait training at least 80ft with RW and no more than min guard PT Short Term Goal 3 (Week 2): Patient to be able to navigate single step with RW and no more than min guard PT Short Term Goal 4 (Week 2): Patient to be able to complete all bed mobility with min guard  Skilled Therapeutic Interventions/Progress Updates:    Patient received up in WC pleasant and willing to participate in therapy. Started session with stair training as this is family's main concern- able to perform as many as 5 four-inch steps with RW and MinA for device management fairly smoothly and safely today but fatigued at the end of this activity. Continued working on gait training- tolerated multiple distances of up to 140ft today with RW/S with ongoing mod cues for R foot clearance due to ongoing issues with foot catching/him stumbling or tripping or simply leaving R foot behind especially when fatigued. Otherwise worked on functional strengthening including sit to stand with 2kg weighted green ball and mini-squats with weighted ball for improved glute and quad strength and endurance. Able to perform all functional transfers with S/RW and intermittent cues for safety today. Left up in WC with all needs met, RN present and attending this afternoon.   Therapy Documentation Precautions:  Precautions Precautions: Fall Precaution Comments: fatigues VERY easily, need to watch O2 sats, R hemiplegia and neglect, impaired cognition Restrictions Weight Bearing Restrictions: No  Pain Assessment Pain Scale: 0-10 Pain Score:  0-No pain    Therapy/Group: Individual Therapy   Kristen U PT, DPT, PN1   Supplemental Physical Therapist [place]    Pager [phone] Acute Rehab Office [phone]   03/21/2020, 3:38 PM  

## 2020-03-21 NOTE — Progress Notes (Signed)
[facility] PHYSICAL MEDICINE & REHABILITATION PROGRESS NOTE  Subjective/Complaints: Patient seen sitting up in bed this morning. He states he slept well overnight. He is questions regarding discharge date.  ROS: Denies CP, SOB, N/V/D  Objective: Vital Signs: Blood pressure 133/78, pulse 85, temperature 98.5 F (36.9 C), temperature source Oral, resp. rate 20, height 6' (1.829 m), weight 88.9 kg, SpO2 94 %. No results found. No results for input(s): WBC, HGB, HCT, PLT in the last 72 hours. Recent Labs    03/20/20 0644  NA 138  K 3.8  CL 107  CO2 24  GLUCOSE 118*  BUN 17  CREATININE 1.38*  CALCIUM 8.3*    Physical Exam: BP 133/78 (BP Location: Left Arm)   Pulse 85   Temp 98.5 F (36.9 C) (Oral)   Resp 20   Ht 6' (1.829 m)   Wt 88.9 kg   SpO2 94%   BMI 26.58 kg/m   Constitutional: No distress . Vital signs reviewed. HENT: Normocephalic.  Atraumatic. Eyes: EOMI. No discharge. Cardiovascular: No JVD. Respiratory: Normal effort.  No stridor. GI: Non-distended. Skin: Warm and dry.  Intact. Psych: Normal mood.  Normal behavior. Musc: No edema in extremities.  No tenderness in extremities. Neuro: Alert HOH Right facial weakness, unchanged Dysarthria, unchanged Motor:  RUE: Shoulder abduction 4/5, distally 4/5 with apraxia, improving RLE: 4+/5 proximal to distal with apraxia. Right inattention improving  Assessment/Plan: 1. Functional deficits secondary to left MCA/ACA infarcts which require 3+ hours per day of interdisciplinary therapy in a comprehensive inpatient rehab setting.  Physiatrist is providing close team supervision and 24 hour management of active medical problems listed below.  Physiatrist and rehab team continue to assess barriers to discharge/monitor patient progress toward functional and medical goals  Care Tool:  Bathing    Body parts bathed by patient: Right arm, Left arm, Chest, Abdomen, Front perineal area, Left upper leg, Face, Right  upper leg, Buttocks, Left lower leg, Right lower leg   Body parts bathed by helper: Buttocks Body parts n/a: Right lower leg, Left lower leg   Bathing assist Assist Level: Supervision/Verbal cueing     Upper Body Dressing/Undressing Upper body dressing Upper body dressing/undressing activity did not occur (including orthotics): N/A What is the patient wearing?: Pull over shirt    Upper body assist Assist Level: Set up assist    Lower Body Dressing/Undressing Lower body dressing    Lower body dressing activity did not occur: N/A What is the patient wearing?: Underwear/pull up, Pants     Lower body assist Assist for lower body dressing: Contact Guard/Touching assist     Toileting Toileting Toileting Activity did not occur (Clothing management and hygiene only): N/A (no void or bm)  Toileting assist Assist for toileting: Contact Guard/Touching assist     Transfers Chair/bed transfer  Transfers assist     Chair/bed transfer assist level: Contact Guard/Touching assist     Locomotion Ambulation   Ambulation assist      Assist level: Minimal Assistance - Patient > 75% Assistive device: Walker-rolling Max distance: 231   Walk 10 feet activity   Assist  Walk 10 feet activity did not occur: Safety/medical concerns (fatigue)  Assist level: Contact Guard/Touching assist Assistive device: Walker-rolling   Walk 50 feet activity   Assist Walk 50 feet with 2 turns activity did not occur: Safety/medical concerns (fatigue)  Assist level: Minimal Assistance - Patient > 75% Assistive device: Walker-rolling    Walk 150 feet activity   Assist Walk 150 feet  activity did not occur: Safety/medical concerns (fatigue)  Assist level: Moderate Assistance - Patient - 50 - 74% (due to forward balance loss) Assistive device: Walker-rolling    Walk 10 feet on uneven surface  activity   Assist Walk 10 feet on uneven surfaces activity did not occur: Safety/medical  concerns (fatigue)         Wheelchair     Assist Will patient use wheelchair at discharge?:  (tbd) Type of Wheelchair: Manual    Wheelchair assist level: Supervision/Verbal cueing Max wheelchair distance: 112ft    Wheelchair 50 feet with 2 turns activity    Assist        Assist Level: Supervision/Verbal cueing   Wheelchair 150 feet activity     Assist     Assist Level: Supervision/Verbal cueing      Medical Problem List and Plan: 1. Right sided weakness with poor safety awareness and decrease in problem solving affecting mobility and ADLs secondary to Left ACA/MCA watershed infarct, small left basal ganglia and thalamic infarct, left parietal occipital infarct on 03/02/20.  Continue CIR 2. Antithrombotics: -DVT/anticoagulation:Pharmaceutical:Other (comment)--Eliquis. -antiplatelet therapy: N/A 3. Pain Management:N/A 4. Mood:LCSW to follow for evaluation and support. -antipsychotic agents: N/a 5. Neuropsych: This patientisnot fully capable of making decisions on hisown behalf. 6. Skin/Wound Care:Routine pressure relief measures. 7. Fluids/Electrolytes/Nutrition:Monitor I/Os 8. HTN: Monitor BP   Lasix/Entresto/aldactone resumed 06/14.   Coreg decreased on 6/25  Controlled on 6/29  Monitor with increased mobility 9. Chronic systolic CHF: Monitor Monitor for signs of overload. Continue Coreg bid, Lasix, Entresto,aldactone and Lipitor.   No ASA due to low platelets.    Filed Weights   03/17/20 0500 03/18/20 0500 03/19/20 0500  Weight: 89.8 kg 89.7 kg 88.9 kg   Stable on 6/27, no repeat weights 10. Cirrhosis of the liver with chronic thrombocytopenia: Monitor for signs of bleeding or decompensation.   LFTs WNL on 6/15  Plts 61 on 6/23, labs ordered for tomorrow 11. Chronic SOB: Multifactorial--? OSA. On Trilegy for reactive airway disease. Duo nebs prn. Encourage IS.   Monitor with increased exertion 12.  Polysubstance abuse + Cocaine and THC on admit : Has been ongoing since 2015. Educate/encourage patient regarding life style changes.  13. Seizure disorder: Continue Keppra bid.   No breakthrough seizures since admission to rehab-6/29 14. Impaired fasting glucose/Hyperglycemia: Hgb A1c- 5.2.   Blood glucose mildly elevated on 6/28  Monitor with increased mobility 15.?  AKI on CKD stage III:   Cr.  1.38 on 6/28  Encourage fluids  Cont to monitor 16. Constipation  Bowel meds increased on 6/15, again on 6/16, decreased on 6/18, decreased again on 6/19  Improving 17.  Macrocytosis  Vitamin B12/folate within normal limits 18. Urinary retention/neurogenic bladder  Flomax increased on 6/23  Improving 19.  Atrial fibrillation  Rate controlled, noted on recent ECG as well  LOS: 15 days A FACE TO FACE EVALUATION WAS PERFORMED  Liviah Cake Karis Juba 03/21/2020, 10:38 AM

## 2020-03-21 NOTE — Progress Notes (Signed)
Occupational Therapy Session Note  Patient Details  Name: Kevin [name] MRN: [id number] Date of Birth: 07-06-1951  Today's Date: 03/21/2020 OT Individual Time: 1130-1200 OT Individual Time Calculation (min): 30 min    Short Term Goals: Week 2:  OT Short Term Goal 1 (Week 2): Pt will complete UB/LB bathing in tub shower in sitting and standing with supervision. OT Short Term Goal 2 (Week 2): Pt will complete tub shower transfer with supervision using least restrictive AD. OT Short Term Goal 3 (Week 2): Pt will complete toilet transfer with supervision. OT Short Term Goal 4 (Week 2): Pt will complete LB dressing with supervision.  Skilled Therapeutic Interventions/Progress Updates:    Pt resting in bed upon arrival and agreeable to therapy. OT intervention with focus on bed mobility, functional transfers, BLE/BUE therex, and attention to R to increase independence with BADLs. Pt required min A for supine>sit EOB in preparation for squat pivot transfer to R. Pt required CGA for squat pivot transfer. Therex: 7 mins NuStep load 5 with BUE/BLE-50 SPM and 5 mins NuStep BLE only-45 SPM. Pt required min verbal cues for attention to RUE throughout session when not engaged in functional task.  Pt returned to room and remained in w/c with seat alarm activated. All needs within reach.   Therapy Documentation Precautions:  Precautions Precautions: Fall Precaution Comments: fatigues VERY easily, need to watch O2 sats, R hemiplegia and neglect, impaired cognition Restrictions Weight Bearing Restrictions: No   Pain: Pain Assessment Pain Scale: 0-10 Pain Score: 0-No pain  Therapy/Group: Individual Therapy  Rich Brave 03/21/2020, 12:05 PM

## 2020-03-21 NOTE — Progress Notes (Signed)
Occupational Therapy Session Note  Patient Details  Name: Kevin [name] MRN: [id number] Date of Birth: 1951-02-26  Today's Date: 03/21/2020 OT Individual Time: 3536-1443 OT Individual Time Calculation (min): 40 min    Short Term Goals: Week 2:  OT Short Term Goal 1 (Week 2): Pt will complete UB/LB bathing in tub shower in sitting and standing with supervision. OT Short Term Goal 2 (Week 2): Pt will complete tub shower transfer with supervision using least restrictive AD. OT Short Term Goal 3 (Week 2): Pt will complete toilet transfer with supervision. OT Short Term Goal 4 (Week 2): Pt will complete LB dressing with supervision.  Skilled Therapeutic Interventions/Progress Updates:    Pt received awake in bed. He stated that he did not get his CPAP on until 4am.  Reminded him that he needs to learn from RT how to apply it himself as he will need to do this at home.  Pt did practice donning the CPAP and he was able to fasten straps. He seems to understand how to just push the on button.  Pt sat to EOB to don shoes with S and cues for sitting balance as he started to put on his shoes before getting himself into a safe sitting angle.   Pt used RW with min A to walk to sink to brush teeth.  Used R hand well to open close containers but he does have inattention and weakness in RLE.  While standing and working on an activity he loses focus on his balance and "sinks" to the R with R knee flexion.  Used tapping cues on his quads to cue him to extend his leg.    Pt sat in w/c to change shirts.   Pt discussed his R leg weakness and that he really wanted to get that stronger.  Worked on [employer] of R knee extension with placing and holding for 2 seconds.   Set pt up with folded towel under R foot for pt to work on self AROM of knee flex/ext.   Chair alarm on and all needs met.   Therapy Documentation Precautions:  Precautions Precautions: Fall Precaution Comments: fatigues VERY easily, need to  watch O2 sats, R hemiplegia and neglect, impaired cognition Restrictions Weight Bearing Restrictions: No    Vital Signs: Therapy Vitals Pulse Rate: 85 BP: 133/78 Patient Position (if appropriate): Lying Oxygen Therapy SpO2: 94 % O2 Device: Room Air Pain:   ADL: ADL Eating: Set up Where Assessed-Eating: Wheelchair Grooming: Setup Where Assessed-Grooming: Wheelchair Upper Body Bathing: Supervision/safety Where Assessed-Upper Body Bathing: Shower Lower Body Bathing: Supervision/safety Where Assessed-Lower Body Bathing: Shower Upper Body Dressing: Setup Where Assessed-Upper Body Dressing: Edge of bed Lower Body Dressing: Contact guard Where Assessed-Lower Body Dressing: Edge of bed Toileting: Contact guard Where Assessed-Toileting: Glass blower/designer: Therapist, music Method: Counselling psychologist: Radiographer, therapeutic: Not assessed Social research officer, government: Curator Method: Heritage manager: Radio broadcast assistant   Therapy/Group: Individual Therapy  [place] 03/21/2020, 10:08 AM

## 2020-03-22 ENCOUNTER — Inpatient Hospital Stay (HOSPITAL_COMMUNITY): Payer: Medicare HMO | Admitting: Speech Pathology

## 2020-03-22 ENCOUNTER — Inpatient Hospital Stay (HOSPITAL_COMMUNITY): Payer: Medicare HMO | Admitting: Physical Therapy

## 2020-03-22 ENCOUNTER — Inpatient Hospital Stay (HOSPITAL_COMMUNITY): Payer: Medicare HMO | Admitting: Occupational Therapy

## 2020-03-22 DIAGNOSIS — D62 Acute posthemorrhagic anemia: Secondary | ICD-10-CM

## 2020-03-22 DIAGNOSIS — G8191 Hemiplegia, unspecified affecting right dominant side: Secondary | ICD-10-CM

## 2020-03-22 DIAGNOSIS — R030 Elevated blood-pressure reading, without diagnosis of hypertension: Secondary | ICD-10-CM

## 2020-03-22 LAB — CBC WITH DIFFERENTIAL/PLATELET
Abs Immature Granulocytes: 0.02 10*3/uL (ref 0.00–0.07)
Basophils Absolute: 0 10*3/uL (ref 0.0–0.1)
Basophils Relative: 0 %
Eosinophils Absolute: 0.1 10*3/uL (ref 0.0–0.5)
Eosinophils Relative: 2 %
HCT: 38.5 % — ABNORMAL LOW (ref 39.0–52.0)
Hemoglobin: 12.9 g/dL — ABNORMAL LOW (ref 13.0–17.0)
Immature Granulocytes: 0 %
Lymphocytes Relative: 4 %
Lymphs Abs: 0.4 10*3/uL — ABNORMAL LOW (ref 0.7–4.0)
MCH: 33.2 pg (ref 26.0–34.0)
MCHC: 33.5 g/dL (ref 30.0–36.0)
MCV: 99.2 fL (ref 80.0–100.0)
Monocytes Absolute: 1 10*3/uL (ref 0.1–1.0)
Monocytes Relative: 11 %
Neutro Abs: 7.2 10*3/uL (ref 1.7–7.7)
Neutrophils Relative %: 83 %
Platelets: 55 10*3/uL — ABNORMAL LOW (ref 150–400)
RBC: 3.88 MIL/uL — ABNORMAL LOW (ref 4.22–5.81)
RDW: 11.2 % — ABNORMAL LOW (ref 11.5–15.5)
WBC: 8.6 10*3/uL (ref 4.0–10.5)
nRBC: 0 % (ref 0.0–0.2)

## 2020-03-22 NOTE — Progress Notes (Signed)
Physical Therapy Weekly Progress Note  Patient Details  Name: Kevin [name] MRN: [id number] Date of Birth: Oct 21, 1950  Beginning of progress report period: March 14, 2020 End of progress report period: March 22, 2020  Today's Date: 03/22/2020 PT Individual Time: 0800-0912 PT Individual Time Calculation (min): 72 min   Patient has met 3 of 4 short term goals.  He continues to make excellent gains towards long term goals and progress much faster than expected- remains on track for scheduled DC on 03/28/20.   Patient continues to demonstrate the following deficits muscle weakness, decreased cardiorespiratoy endurance, decreased coordination and decreased motor planning, decreased attention to right, decreased awareness, decreased problem solving, decreased safety awareness, decreased memory and delayed processing and decreased standing balance and decreased balance strategies and therefore will continue to benefit from skilled PT intervention to increase functional independence with mobility.  Patient progressing toward long term goals..  Continue plan of care.  PT Short Term Goals Week 2:  PT Short Term Goal 1 (Week 2): Patient to be able to perform all functional bed <-> chair transfers with S and LRAD PT Short Term Goal 1 - Progress (Week 2): Met PT Short Term Goal 2 (Week 2): Patient to tolerate gait training at least 87f with RW and no more than min guard PT Short Term Goal 2 - Progress (Week 2): Met PT Short Term Goal 3 (Week 2): Patient to be able to navigate single step with RW and no more than min guard PT Short Term Goal 3 - Progress (Week 2): Progressing toward goal PT Short Term Goal 4 (Week 2): Patient to be able to complete all bed mobility with min guard PT Short Term Goal 4 - Progress (Week 2): Met Week 3:  PT Short Term Goal 1 (Week 3): STG = LTG due to remaining LOS  Skilled Therapeutic Interventions/Progress Updates:     Patient received in bed, pleasant and willing to  participate in therapy today. Able to complete all bed mobility with S, as well as all functional transfers with RW and S today. Able to don new shorts and shirt at EOB with S and ended up only needing MinA for donning socks/shoes due to bilateral hip stiffness. Tolerated significant progression of gait distance today, able to gait train [id number] with RW and S with min cues for R foot clearance/improved ankle dorsiflexion but does still have some foot scuffing/mild tripping (able to recover independently) when cues were removed. Otherwise worked on functional strengthening including sit to stand with yellow ball/no UEs and MinA to boost to upright, step ups on 6 inch step, and forward lunges onto 6 inch step. Able to "walk" WC from sitting using hamstrings approximatley 6021fefore becoming limited by fatigue. Continues to demonstrate R sided inattention. Left up in WC [facility], Theth all needs met, chair alarm active this morning. Progressing very well with therapy.   Therapy Documentation Precautions:  Precautions Precautions: Fall Precaution Comments: fatigues VERY easily, need to watch O2 sats, R hemiplegia and neglect, impaired cognition Restrictions Weight Bearing Restrictions: No   Pain: Pain Assessment Pain Scale: 0-10 Pain Score: 0-No pain       Therapy/Group: Individual Therapy   KriWindell NorfolkPT, PN1   Supplemental Physical Therapist [place] Pager [phone] Rehab Office [phone] 03/22/2020, 12:29 PM

## 2020-03-22 NOTE — Plan of Care (Signed)
  Problem: RH Memory Goal: LTG Patient will demonstrate ability for day to day (SLP) Description: LTG:   Patient will demonstrate ability for day to day recall/carryover during cognitive/linguistic activities with assist  (SLP) Flowsheets (Taken 03/22/2020 1435) LTG: Patient will demonstrate ability for day to day recall/carryover during cognitive/linguistic activities with assist (SLP): Supervision Goal: LTG Patient will use memory compensatory aids to (SLP) Description: LTG:  Patient will use memory compensatory aids to recall biographical/new, daily complex information with cues (SLP) Flowsheets (Taken 03/22/2020 1435) LTG: Patient will use memory compensatory aids to (SLP): Supervision   Problem: RH Attention Goal: LTG Patient will demonstrate this level of attention during functional activites (SLP) Description: LTG:  Patient will will demonstrate this level of attention during functional activites (SLP) Flowsheets (Taken 03/22/2020 1434) Patient will demonstrate during cognitive/linguistic activities the attention type of: Selective Patient will demonstrate this level of attention during cognitive/linguistic activities in: Controlled LTG: Patient will demonstrate this level of attention during cognitive/linguistic activities with assistance of (SLP): Supervision Number of minutes patient will demonstrate attention during cognitive/linguistic activities: 30  Goals upgraded 03/22/20 due to greater than expected progress

## 2020-03-22 NOTE — Plan of Care (Signed)
  Problem: RH Attention Goal: LTG Patient will demonstrate this level of attention during functional activites (SLP) Description: LTG:  Patient will will demonstrate this level of attention during functional activites (SLP) Flowsheets (Taken 03/22/2020 1434) Patient will demonstrate during cognitive/linguistic activities the attention type of: Selective Patient will demonstrate this level of attention during cognitive/linguistic activities in: Controlled LTG: Patient will demonstrate this level of attention during cognitive/linguistic activities with assistance of (SLP): Supervision Number of minutes patient will demonstrate attention during cognitive/linguistic activities: 30  Goal upgraded 03/22/20 due to greater than expected progress

## 2020-03-22 NOTE — Progress Notes (Signed)
Patient ID: Kevin [name], male   DOB: 1951-03-28, 69 y.o.   MRN: [id number]  Team Conference Report to Patient/Family  Team Conference discussion was reviewed with the patient and caregiver, including goals, any changes in plan of care and target discharge date.  Patient and caregiver express understanding and are in agreement.  The patient has a target discharge date of 03/28/20.  Andria Rhein 03/22/2020, 2:15 PM

## 2020-03-22 NOTE — Progress Notes (Addendum)
[place] PHYSICAL MEDICINE & REHABILITATION PROGRESS NOTE  Subjective/Complaints: Patient seen laying in bed this morning.  He states he slept well overnight.  He is about to work with therapies.  He denies complaints.  ROS: Denies CP, SOB, N/V/D  Objective: Vital Signs: Blood pressure (!) 138/98, pulse 92, temperature 98.1 F (36.7 C), temperature source Oral, resp. rate 19, height 6' (1.829 m), weight 88.9 kg, SpO2 96 %. No results found. Recent Labs    03/22/20 0624  WBC 8.6  HGB 12.9*  HCT 38.5*  PLT 55*   Recent Labs    03/20/20 0644  NA 138  K 3.8  CL 107  CO2 24  GLUCOSE 118*  BUN 17  CREATININE 1.38*  CALCIUM 8.3*    Physical Exam: BP (!) 138/98 (BP Location: Left Arm)   Pulse 92   Temp 98.1 F (36.7 C) (Oral)   Resp 19   Ht 6' (1.829 m)   Wt 88.9 kg   SpO2 96%   BMI 26.58 kg/m   Constitutional: No distress . Vital signs reviewed. HENT: Normocephalic.  Atraumatic. Eyes: EOMI. No discharge. Cardiovascular: No JVD. Respiratory: Normal effort.  No stridor. GI: Non-distended. Skin: Warm and dry.  Intact. Psych: Normal mood.  Normal behavior. Musc: No edema in extremities.  No tenderness in extremities. Neuro: Alert HOH Right facial weakness, unchanged Dysarthria, stable Motor:  RUE: Shoulder abduction 4/5, distally 4/5 with apraxia, improving RLE: 4+/5 proximal to distal with apraxia, stable. Right inattention improving  Assessment/Plan: 1. Functional deficits secondary to left MCA/ACA infarcts which require 3+ hours per day of interdisciplinary therapy in a comprehensive inpatient rehab setting.  Physiatrist is providing close team supervision and 24 hour management of active medical problems listed below.  Physiatrist and rehab team continue to assess barriers to discharge/monitor patient progress toward functional and medical goals  Care Tool:  Bathing    Body parts bathed by patient: Right arm, Left arm, Chest, Abdomen, Front  perineal area, Left upper leg, Face, Right upper leg, Buttocks, Left lower leg, Right lower leg   Body parts bathed by helper: Buttocks Body parts n/a: Right lower leg, Left lower leg   Bathing assist Assist Level: Supervision/Verbal cueing     Upper Body Dressing/Undressing Upper body dressing Upper body dressing/undressing activity did not occur (including orthotics): N/A What is the patient wearing?: Pull over shirt    Upper body assist Assist Level: Set up assist    Lower Body Dressing/Undressing Lower body dressing    Lower body dressing activity did not occur: N/A What is the patient wearing?: Underwear/pull up, Pants     Lower body assist Assist for lower body dressing: Contact Guard/Touching assist     Toileting Toileting Toileting Activity did not occur (Clothing management and hygiene only): N/A (no void or bm)  Toileting assist Assist for toileting: Contact Guard/Touching assist     Transfers Chair/bed transfer  Transfers assist     Chair/bed transfer assist level: Contact Guard/Touching assist     Locomotion Ambulation   Ambulation assist      Assist level: Supervision/Verbal cueing Assistive device: Walker-rolling Max distance: 144ft   Walk 10 feet activity   Assist  Walk 10 feet activity did not occur: Safety/medical concerns (fatigue)  Assist level: Supervision/Verbal cueing Assistive device: Walker-rolling   Walk 50 feet activity   Assist Walk 50 feet with 2 turns activity did not occur: Safety/medical concerns (fatigue)  Assist level: Supervision/Verbal cueing Assistive device: Walker-rolling    Walk 150  feet activity   Assist Walk 150 feet activity did not occur: Safety/medical concerns (fatigue)  Assist level: Moderate Assistance - Patient - 50 - 74% (due to forward balance loss) Assistive device: Walker-rolling    Walk 10 feet on uneven surface  activity   Assist Walk 10 feet on uneven surfaces activity did not  occur: Safety/medical concerns (fatigue)         Wheelchair     Assist Will patient use wheelchair at discharge?:  (tbd) Type of Wheelchair: Manual    Wheelchair assist level: Supervision/Verbal cueing Max wheelchair distance: 16ft    Wheelchair 50 feet with 2 turns activity    Assist        Assist Level: Supervision/Verbal cueing   Wheelchair 150 feet activity     Assist     Assist Level: Supervision/Verbal cueing      Medical Problem List and Plan: 1. Right sided hemiparesis with poor safety awareness and decrease in problem solving affecting mobility and ADLs secondary to Left ACA/MCA watershed infarct, small left basal ganglia and thalamic infarct, left parietal occipital infarct on 03/02/20.  Continue CIR  Team conference today to discuss current and goals and coordination of care, home and environmental barriers, and discharge planning with nursing, case manager, and therapies.  2. Antithrombotics: -DVT/anticoagulation:Pharmaceutical:Other (comment)--Eliquis. -antiplatelet therapy: N/A 3. Pain Management:N/A 4. Mood:LCSW to follow for evaluation and support. -antipsychotic agents: N/a 5. Neuropsych: This patientisnot fully capable of making decisions on hisown behalf. 6. Skin/Wound Care:Routine pressure relief measures. 7. Fluids/Electrolytes/Nutrition:Monitor I/Os 8. HTN: Monitor BP   Lasix/Entresto/aldactone resumed 06/14.   Coreg decreased on 6/25  Elevated diastolic pressures on 6/30  Monitor with increased mobility 9. Chronic systolic CHF: Monitor Monitor for signs of overload. Continue Coreg bid, Lasix, Entresto,aldactone and Lipitor.   No ASA due to low platelets.    Filed Weights   03/17/20 0500 03/18/20 0500 03/19/20 0500  Weight: 89.8 kg 89.7 kg 88.9 kg   Stable on 6/27, no repeat weights, remain pending 10. Cirrhosis of the liver with chronic thrombocytopenia: Monitor for signs of bleeding or  decompensation.   LFTs WNL on 6/15  Plts 55 on 6/30 11. Chronic SOB: Multifactorial--? OSA. On Trilegy for reactive airway disease. Duo nebs prn. Encourage IS.   Monitor with increased exertion 12. Polysubstance abuse + Cocaine and THC on admit : Has been ongoing since 2015. Educate/encourage patient regarding life style changes.  13. Seizure disorder: Continue Keppra bid.   No breakthrough seizures since admission to rehab-6/30 14. Impaired fasting glucose/Hyperglycemia: Hgb A1c- 5.2.   Blood glucose mildly elevated on 6/28  Monitor with increased mobility 15.?  AKI on CKD stage III:   Cr.  1.38 on 6/28  Encourage fluids  Cont to monitor 16. Constipation  Bowel meds increased on 6/15, again on 6/16, decreased on 6/18, decreased again on 6/19  Improving 17.  Macrocytosis  Vitamin B12/folate within normal limits 18. Urinary retention/neurogenic bladder  Flomax increased on 6/23  Improving 19.  Atrial fibrillation  Rate controlled, noted on recent ECG as well 12.  Acute blood loss anemia-mild   Hemoglobin 12.9 on 6/30  Continue to monitor  LOS: 16 days A FACE TO FACE EVALUATION WAS PERFORMED  Caro Brundidge Karis Juba 03/22/2020, 9:27 AM

## 2020-03-22 NOTE — Patient Care Conference (Signed)
Inpatient RehabilitationTeam Conference and Plan of Care Update Date: 03/22/2020   Time: 1:29 PM    Patient Name: Kevin [name]      Medical Record Number: [id number]  Date of Birth: 1951/08/02 Sex: Male         Room/Bed: 4W21C/4W21C-01 Payor Info: Payor: [employer] MEDICARE / Plan: [employer] MEDICARE HMO / Product Type: *No Product type* /    Admit Date/Time:  03/06/2020  5:51 PM  Primary Diagnosis:  Cerebrovascular accident (CVA) due to embolism of cerebral artery [facility])  Patient Active Problem List   Diagnosis Date Noted  . Acute blood loss anemia   . Blood pressure increase diastolic   . Right hemiparesis (HCC)   . Neurogenic bladder   . Urinary retention   . Labile blood glucose   . Labile blood pressure   . Macrocytosis   . Hyperglycemia   . Slow transit constipation   . Chronic systolic congestive heart failure (HCC)   . Benign essential HTN   . Embolic stroke (HCC) 03/06/2020  . Cerebrovascular accident (CVA) due to embolism of cerebral artery (HCC) 03/03/2020  . Acute focal neurological deficit 03/02/2020  . Liver cirrhosis (HCC)   . Metabolic encephalopathy   . Chronic systolic CHF (congestive heart failure) (HCC)   . Paroxysmal atrial fibrillation (HCC)   . Essential hypertension   . HCV antibody positive   . Secondary hypertension, unspecified   . Elevated troponin   . Blood pressure instability   . CAD in native artery cath 2016 90% mRamus  02/01/2016  . Atrial fibrillation, permanent (HCC) 02/01/2016  . NSTEMI (non-ST elevated myocardial infarction) (HCC)   . Status epilepticus (HCC) 01/31/2016  . High anion gap metabolic acidosis   . Lactic acidosis   . Cerebral infarction due to unspecified mechanism   . Hypertensive emergency   . Seizures (HCC)   . CVA (cerebral infarction)   . TIA (transient ischemic attack) 08/23/2015  . CKD (chronic kidney disease) stage 3, GFR 30-59 ml/min 08/23/2015  . Acute ischemic stroke (HCC) 08/23/2015  . Abnormal nuclear  stress test 09/27/2014  . H/O medication noncompliance 09/27/2014  . Unstable angina (HCC) 09/27/2014  . Persistent atrial fibrillation (HCC) 08/23/2014  . Chronic diastolic heart failure (HCC) 08/23/2014  . Polysubstance abuse (HCC) 08/23/2014  . Erectile dysfunction 08/23/2014  . Cirrhosis of liver (HCC) 04/23/2014  . DOE (dyspnea on exertion) 04/19/2014  . Atrial fibrillation with RVR (HCC) 04/19/2014  . Dysphagia 04/19/2014  . Obesity 05/02/2012  . NSVT (nonsustained ventricular tachycardia) (HCC) 05/02/2012  . Thrombocytopenia (HCC) 05/02/2012  . Cholelithiases 05/01/2012  . Chest pain, no significant macrovascular CAD at cath 05/01/12; Likely due to elevated LVEDP & Afib with Diastolic HF. 04/30/2012  . Atrial fibrillation with RVR, converted to SR with TEE and DCCV 05/05/12 04/30/2012  . PAD (peripheral artery disease), PTA to Lt. SFA in 2009, total occlusion mid Rt SFA with collaterals  04/30/2012  . Cocaine abuse (HCC) 04/30/2012  . HTN (hypertension) -Accelerated with End organ involvement 04/30/2012  . Tobacco abuse 04/30/2012  . ETOH abuse 04/30/2012  . Hepatitis C antibody test positive 04/23/2012    Expected Discharge Date: Expected Discharge Date: 03/28/20  Team Members Present: Physician leading conference: Dr. Maryla Morrow Care Coodinator Present: Chana Bode, RN, BSN, CRRN;Christina Vita Barley, BSW Nurse Present: Willey Blade, RN PT Present: Nedra Hai, PT OT Present: Primitivo Gauze, OT SLP Present: Suzzette Righter, CF-SLP PPS Coordinator present : Fae Pippin, SLP     Current Status/Progress Goal  Weekly Team Focus  Bowel/Bladder   Continent of B&B  I&O Q8H for no void. Maintain Continence of B&B.  Assess toileting needs QShift   Swallow/Nutrition/ Hydration             ADL's   CGA for transfers and LB dressing/ toileting; S with UB self care  Supervision overall with BADLs  ADL training, RUE/ RLE NMR, standing balance, memory, pt/family education    Mobility   Really coming along well! making progress towards S level goals, able to progress gait distance to 140-262ft with reduced assistance, still needs MinA for 5 stpes with RW. Likely need for AFO.  S overall  gait, transfers, steps, strength, functional activity tolerance, safety   Communication   Supervisoin-Min A comprehension mildly complex langauge/directions, fluctuates with fatigue and overall mentation  Supervision  2-step directions, mildly complex converstaional topics   Safety/Cognition/ Behavioral Observations  Min-Mod depending on complexity of task, intellectual awarenress and recall greatly improved  Min A  error awareness, functional problem solving - trying to increase complexity, selective attention   Pain   Pain is 0  Maintain pain level at 0  Assess pain Qshift and PRN   Skin   Skin clean, dry, intact  Maintain good skin integrity  Assess skin Qshift    Rehab Goals Patient on target to meet rehab goals: Yes Rehab Goals Revised: patient on target with current goals *See Care Plan and progress notes for long and short-term goals.     Barriers to Discharge  Current Status/Progress Possible Resolutions Date Resolved   Nursing                  PT  Other (comments)  cognition- will need 24/7S for safety given R inattention and decreaesd safety awareness              OT                  SLP                Care Coordinator Decreased caregiver support;Home environment access/layout 1 step to enter home on target          Discharge Planning/Teaching Needs:  Patient plans to discharge home with spouse  Will schedule with spouse if recommended   Team Discussion:  Elevated BP over the past few days. AKI improved, urinary retention better. Note some inattention to right side and ocassional right knee buckling. Continue to note slow processing in the morning but making progress towards supervision goals. Currently at [facility] for OT and supervision for transfers and  minimal assist for steps.  Revisions to Treatment Plan:  Prosthetics consulted for AFO 03/24/20. Increased work on complexity of problem solving as awareness has improved.    Medical Summary Current Status: Right sided hemiparesis with poor safety awareness and decrease in problem solving affecting mobility and ADLs  secondary to Left ACA/MCA watershed infarct, small left basal ganglia and thalamic infarct, left parietal occipital infarct on 03/02/20. Weekly Focus/Goal: Improve mobility, urinary retention, platelets, CHF, BP  Barriers to Discharge: Behavior;Medical stability;Incontinence;Weight   Possible Resolutions to Barriers: Therapies, follow labs,  follow weights, optimize BP meds   Continued Need for Acute Rehabilitation Level of Care: The patient requires daily medical management by a physician with specialized training in physical medicine and rehabilitation for the following reasons: Direction of a multidisciplinary physical rehabilitation program to maximize functional independence : Yes Medical management of patient stability for increased activity during participation in  an intensive rehabilitation regime.: Yes Analysis of laboratory values and/or radiology reports with any subsequent need for medication adjustment and/or medical intervention. : Yes   I attest that I was present, lead the team conference, and concur with the assessment and plan of the team.   Chana Bode B 03/22/2020, 1:29 PM

## 2020-03-22 NOTE — Progress Notes (Signed)
Occupational Therapy Session Note  Patient Details  Name: Kevin [name] MRN: [id number] Date of Birth: 11-26-1950  Today's Date: 03/22/2020 OT Individual Time: 1000-1100 OT Individual Time Calculation (min): 60 min    Short Term Goals: Week 2:  OT Short Term Goal 1 (Week 2): Pt will complete UB/LB bathing in tub shower in sitting and standing with supervision. OT Short Term Goal 2 (Week 2): Pt will complete tub shower transfer with supervision using least restrictive AD. OT Short Term Goal 3 (Week 2): Pt will complete toilet transfer with supervision. OT Short Term Goal 4 (Week 2): Pt will complete LB dressing with supervision.  Skilled Therapeutic Interventions/Progress Updates:    Pt received in wc ready for therapy. Declined taking a shower today, but agreeable to going to gym to work on exercises.    Pt initially taken to ADL  Apt to practice tub bench transfers with CGA. Pt did well following directions and discussed safety of transfers and how to position body getting in and out of tub.   Pt then transported to the gym to focus on RUE and RLE strength and coordination.   Standing at end of stairs with B hands on B rails: -Squats -R leg taps on steps -R knee lifts -Heel raises -Lateral wt shifts -In all these ex, pt tended to shift too far anteriorly and had -difficulty keeping weight over heels  Pt then transferred to tall mat: -Diagonal ball twists to R to force use of R quad  -Sit to stand with B hands holding ball (pt did much better with this maintaining alignment without forward wt shift) -Sit to stand and then overhead arm reach with a/arom for full R sh flex -squats  Seated on mat: -Bounce and catch of gym ball of pt tossing ball to floor and catching on up phase --tossed ball overhead and caught 12x -B hands on dowel bar to hit ball back tossed at him with cues to use R hand as much as L  Transferred back to wc and returned to room with all needs met. Bed  alarm on.   Therapy Documentation Precautions:  Precautions Precautions: Fall Precaution Comments: fatigues VERY easily, need to watch O2 sats, R hemiplegia and neglect, impaired cognition Restrictions Weight Bearing Restrictions: No  Pain: Pain Assessment Pain Scale: 0-10 Pain Score: 0-No pain    Therapy/Group: Individual Therapy  [place] 03/22/2020, 11:30 AM

## 2020-03-22 NOTE — Progress Notes (Signed)
Speech Language Pathology Daily Session Note  Patient Details  Name: Kevin [name] MRN: [id number] Date of Birth: [birth year]  Today's Date: 03/22/2020 SLP Individual Time: 1230-1326 SLP Individual Time Calculation (min): 56 min  Short Term Goals: Week 3: SLP Short Term Goal 1 (Week 3): STG=LTG due to remaining length of stay  Skilled Therapeutic Interventions: Pt was seen for skilled ST targeting cognitive goals. Pt's wife present for ~1/2 of session and open to all educational opportunities regarding target of activities and progress in ST. Pt recreated basic to mildly complex PEG board designs (from pictures) with Mod faded to Min A verbal and visual cues for error awareness, consistent Min A for problem solving. Pt also selectively attended to tasks with only Supervision A verbal cues for redirection throughout entire session. Pt left sitting in chair with alarm set and needs within reach, wife still present. Continue per current plan of care.        Pain Pain Assessment Pain Scale: 0-10 Pain Score: 0-No pain  Therapy/Group: Individual Therapy  Kevin [name] 03/22/2020, 7:22 AM

## 2020-03-22 NOTE — Progress Notes (Signed)
Speech Language Pathology Daily Session Note  Patient Details  Name: Kevin [name] MRN: [id number] Date of Birth: 08/18/1951  Today's Date: 03/22/2020 SLP Individual Time: 0730-0800 SLP Individual Time Calculation (min): 30 min  Short Term Goals: Week 3: SLP Short Term Goal 1 (Week 3): STG=LTG due to remaining length of stay  Skilled Therapeutic Interventions: Pt was seen for skilled ST targeting cognitive goals. Pt initially slow to arouse, requiring cues for task initiation and problem solving to reposition himself in bed. Pt also with increased need for Mod A verbal cues for redirection to self-feeding breakfast today - increased over previous sessions; of note, pt's mentation is typically most reduced in AM (per wife and other therapies). Pt receptive to a time constraint (20 mins) to finish his breakfast - with time constraints cues for sustained attention to intake could fade to Min A half way through session. Pt also required increased cueing for recall of functional conversational topics. Pt left laying in bed with alarm set and needs within reach. Continue per current plan of care.        Pain Pain Assessment Pain Scale: 0-10 Pain Score: 0-No pain  Therapy/Group: Individual Therapy  Kevin [name] 03/22/2020, 6:57 AM

## 2020-03-23 ENCOUNTER — Inpatient Hospital Stay (HOSPITAL_COMMUNITY): Payer: Medicare HMO | Admitting: Occupational Therapy

## 2020-03-23 ENCOUNTER — Inpatient Hospital Stay (HOSPITAL_COMMUNITY): Payer: Medicare HMO | Admitting: Speech Pathology

## 2020-03-23 ENCOUNTER — Inpatient Hospital Stay (HOSPITAL_COMMUNITY): Payer: Medicare HMO | Admitting: Physical Therapy

## 2020-03-23 DIAGNOSIS — N1831 Chronic kidney disease, stage 3a: Secondary | ICD-10-CM

## 2020-03-23 NOTE — Progress Notes (Signed)
Speech Language Pathology Daily Session Note  Patient Details  Name: Kevin [name] MRN: [id number] Date of Birth: 1951-07-25  Today's Date: 03/23/2020 SLP Individual Time: 3335-4562 SLP Individual Time Calculation (min): 55 min  Short Term Goals: Week 3: SLP Short Term Goal 1 (Week 3): STG=LTG due to remaining length of stay  Skilled Therapeutic Interventions: Pt was seen for skilled ST targeting cognitive goals. SLP facilitated session with  Min A verbal cues for error awareness, Min faded to Supervision A verbal and visual cues for recall and problem solving during a novel semi-complex card task (Blink). He used daily therapy schedule to anticipate furture sessions and recall familiar VS unfamiliar therapists with Supervision A verbal cues. Pt selectively attended to tasks in a moderately distracting environment with both visual and auditory distractions with Supervision A verbal cues for redirection. Pt left laying in bed with alarm set and needs within reach. Continue per current plan of care.        Pain Pain Assessment Pain Scale: 0-10 Pain Score: 0-No pain  Therapy/Group: Individual Therapy  [name] 03/23/2020, 7:06 AM

## 2020-03-23 NOTE — Progress Notes (Signed)
Physical Therapy Session Note  Patient Details  Name: Kevin [name] MRN: [id number] Date of Birth: 01-Jan-1951  Today's Date: 03/23/2020 PT Individual Time: 2800-3491 PT Individual Time Calculation (min): 33 min   Short Term Goals: Week 3:  PT Short Term Goal 1 (Week 3): STG = LTG due to remaining LOS  Skilled Therapeutic Interventions/Progress Updates:    Pt received sitting in w/c and agreeable to therapy session. Sit<>stands using RW with close supervision for safety during session. Gait training ~135ft to main therapy gym using RW with close supervision and intermittent CGA due to pt not clearing R LE fully during swing catching toes causing minor anterior LOB - pt reports he doesn't need to wear the AFO that was located in his room. Demos R inattention or R/L laterality impairment as when asked to turn R he turned L. Gait ~63ft to/from // bars x2, no AD, with min assist for balance - continues to catch R toes on ground during swing. Standing in // bars with B UE support performed R LE hamstring curls with 4lb ankle weight 2x15reps - demonstrated much improved muscle recruitment in 2nd set compared to 1st with increased knee flexion AROM. Lateral side stepping in // bars down/back x2 without UE support wearing 4lb weight on R ankle - min assist for balance. Gait training ~1ft using RW while wearing 4lb weight on R LE - demonstrates improved R LE foot clearance but may be due to not having walked a far enough distance. Gait training ~121ft back to room using RW again with close supervision progressed to more CGA for steadying as pt has progressively more impaired R LE swing phase gait mechanics causes foot drag/catch with further distance - pt also continues to demonstrate impaired safety awareness when turning with AD by pt turning RW a full 90degrees prior to turning his feet; however, when therapist attempted to cue him for correction to increase safety pt not very receptive. Pt requesting to  return to bed. Sit>supine, HOB elevated, with supervision. Left supine in bed with needs in reach and bed alarm on.  Therapy Documentation Precautions:  Precautions Precautions: Fall Precaution Comments: fatigues VERY easily, need to watch O2 sats, R hemiplegia and neglect, impaired cognition Restrictions Weight Bearing Restrictions: No  Pain: Reports some muscle soreness in L anterior tibialis muscle.   Therapy/Group: Individual Therapy  Ginny Forth, PT, DPT, CSRS 03/23/2020, 3:20 PM

## 2020-03-23 NOTE — Progress Notes (Signed)
RT offered to place pt on CPAP dream station and pt declined stating he did not want to wear it tonight. Pt respiratory status is stable at this time. RT will continue to monitor.

## 2020-03-23 NOTE — Progress Notes (Signed)
Occupational Therapy Session Note  Patient Details  Name: [name] MRN: [id number] Date of Birth: 19-Feb-1951  Today's Date: 03/23/2020 OT Individual Time: 1130-1200 OT Individual Time Calculation (min): 30 min    Short Term Goals: Week 1:  OT Short Term Goal 1 (Week 1): Pt will donn/doff shirt with min assist OT Short Term Goal 1 - Progress (Week 1): Met OT Short Term Goal 2 (Week 1): Pt will bathe LB with mod assist OT Short Term Goal 2 - Progress (Week 1): Met OT Short Term Goal 3 (Week 1): Pt will complete toilet transfer using BSC with min assist OT Short Term Goal 3 - Progress (Week 1): Met OT Short Term Goal 4 (Week 1): Pt will complete LB dressing with mod assist. OT Short Term Goal 4 - Progress (Week 1): Met Week 2:  OT Short Term Goal 1 (Week 2): Pt will complete UB/LB bathing in tub shower in sitting and standing with supervision. OT Short Term Goal 2 (Week 2): Pt will complete tub shower transfer with supervision using least restrictive AD. OT Short Term Goal 3 (Week 2): Pt will complete toilet transfer with supervision. OT Short Term Goal 4 (Week 2): Pt will complete LB dressing with supervision.  Skilled Therapeutic Interventions/Progress Updates:    Pt asleep in bed at OT arrival.  Pt agreeable to OT session and requesting to brush his teeth.  Pt completed all functonal mobility including supine>sit, sit to stand, EOB to sink with RW, stand to sit at w/c with supervision.  Pt completed oral care standing sinkside with setup.  Pt asking OT what to do if a fall occurs at home and questions asked about scope of [place] OT.  Educated pt that therapy can train pt in fall recovery if appropriate and emergency planning in case of fall.  Educated pt on OT scope in [facility] setting.  Pt sitting up in w/c with call bell in reach, seat alarm on at end of session.  Improved fluidity and fine motor coordination and control noted RUE during oral hygiene today.  Therapy  Documentation Precautions:  Precautions Precautions: Fall Precaution Comments: fatigues VERY easily, need to watch O2 sats, R hemiplegia and neglect, impaired cognition Restrictions Weight Bearing Restrictions: No   Therapy/Group: Individual Therapy  Ezekiel Slocumb 03/23/2020, 12:56 PM

## 2020-03-23 NOTE — Progress Notes (Signed)
[place] PHYSICAL MEDICINE & REHABILITATION PROGRESS NOTE  Subjective/Complaints: Patient seen sitting up in bed this morning.  He states he slept well overnight.  He states he is getting better day by day.  He is counting down the days until discharge.  ROS: Denies CP, SOB, N/V/D  Objective: Vital Signs: Blood pressure 124/79, pulse 91, temperature 98.1 F (36.7 C), resp. rate 18, height 6' (1.829 m), weight 88.9 kg, SpO2 96 %. No results found. Recent Labs    03/22/20 0624  WBC 8.6  HGB 12.9*  HCT 38.5*  PLT 55*   No results for input(s): NA, K, CL, CO2, GLUCOSE, BUN, CREATININE, CALCIUM in the last 72 hours.  Physical Exam: BP 124/79 (BP Location: Right Arm)   Pulse 91   Temp 98.1 F (36.7 C)   Resp 18   Ht 6' (1.829 m)   Wt 88.9 kg   SpO2 96%   BMI 26.58 kg/m   Constitutional: No distress . Vital signs reviewed. HENT: Normocephalic.  Atraumatic. Eyes: EOMI. No discharge. Cardiovascular: No JVD. Respiratory: Normal effort.  No stridor. GI: Non-distended. Skin: Warm and dry.  Intact. Psych: Normal mood.  Normal behavior. Musc: No edema in extremities.  No tenderness in extremities. Neuro: Alert HOH Right facial weakness, unchanged Dysarthria, unchanged Motor:  RUE: 4+/5 proximal to distal RLE: 4+/5 proximal to distal with apraxia, stable. Right inattention improving  Assessment/Plan: 1. Functional deficits secondary to left MCA/ACA infarcts which require 3+ hours per day of interdisciplinary therapy in a comprehensive inpatient rehab setting.  Physiatrist is providing close team supervision and 24 hour management of active medical problems listed below.  Physiatrist and rehab team continue to assess barriers to discharge/monitor patient progress toward functional and medical goals  Care Tool:  Bathing    Body parts bathed by patient: Right arm, Left arm, Chest, Abdomen, Front perineal area, Left upper leg, Face, Right upper leg, Buttocks, Left lower  leg, Right lower leg   Body parts bathed by helper: Buttocks Body parts n/a: Right lower leg, Left lower leg   Bathing assist Assist Level: Supervision/Verbal cueing     Upper Body Dressing/Undressing Upper body dressing Upper body dressing/undressing activity did not occur (including orthotics): N/A What is the patient wearing?: Pull over shirt    Upper body assist Assist Level: Set up assist    Lower Body Dressing/Undressing Lower body dressing    Lower body dressing activity did not occur: N/A What is the patient wearing?: Underwear/pull up, Pants     Lower body assist Assist for lower body dressing: Contact Guard/Touching assist     Toileting Toileting Toileting Activity did not occur (Clothing management and hygiene only): N/A (no void or bm)  Toileting assist Assist for toileting: Contact Guard/Touching assist     Transfers Chair/bed transfer  Transfers assist     Chair/bed transfer assist level: Supervision/Verbal cueing     Locomotion Ambulation   Ambulation assist      Assist level: Supervision/Verbal cueing Assistive device: Walker-rolling Max distance: 265ft   Walk 10 feet activity   Assist  Walk 10 feet activity did not occur: Safety/medical concerns (fatigue)  Assist level: Supervision/Verbal cueing Assistive device: Walker-rolling   Walk 50 feet activity   Assist Walk 50 feet with 2 turns activity did not occur: Safety/medical concerns (fatigue)  Assist level: Supervision/Verbal cueing Assistive device: Walker-rolling    Walk 150 feet activity   Assist Walk 150 feet activity did not occur: Safety/medical concerns (fatigue)  Assist level: Supervision/Verbal  cueing Assistive device: Walker-rolling    Walk 10 feet on uneven surface  activity   Assist Walk 10 feet on uneven surfaces activity did not occur: Safety/medical concerns (fatigue)         Wheelchair     Assist Will patient use wheelchair at discharge?:   (tbd) Type of Wheelchair: Manual    Wheelchair assist level: Supervision/Verbal cueing Max wheelchair distance: 162ft    Wheelchair 50 feet with 2 turns activity    Assist        Assist Level: Supervision/Verbal cueing   Wheelchair 150 feet activity     Assist     Assist Level: Supervision/Verbal cueing      Medical Problem List and Plan: 1. Right sided hemiparesis with poor safety awareness and decrease in problem solving affecting mobility and ADLs secondary to Left ACA/MCA watershed infarct, small left basal ganglia and thalamic infarct, left parietal occipital infarct on 03/02/20.  Continue CIR 2. Antithrombotics: -DVT/anticoagulation:Pharmaceutical:Other (comment)--Eliquis. -antiplatelet therapy: N/A 3. Pain Management:N/A 4. Mood:LCSW to follow for evaluation and support. -antipsychotic agents: N/a 5. Neuropsych: This patientisnot fully capable of making decisions on hisown behalf. 6. Skin/Wound Care:Routine pressure relief measures. 7. Fluids/Electrolytes/Nutrition:Monitor I/Os 8. HTN: Monitor BP   Lasix/Entresto/aldactone resumed 06/14.   Coreg decreased on 6/25  Controlled on 7/1  Monitor with increased mobility 9. Chronic systolic CHF: Monitor Monitor for signs of overload. Continue Coreg bid, Lasix, Entresto,aldactone and Lipitor.   No ASA due to low platelets.    Filed Weights   03/17/20 0500 03/18/20 0500 03/19/20 0500  Weight: 89.8 kg 89.7 kg 88.9 kg   Stable on 6/27, no repeat weights, remain pending on 7/1, discussed with nursing 10. Cirrhosis of the liver with chronic thrombocytopenia: Monitor for signs of bleeding or decompensation.   LFTs WNL on 6/15  Plts 55 on 6/30, labs ordered for tomorrow 11. Chronic SOB: Multifactorial--? OSA. On Trilegy for reactive airway disease. Duo nebs prn. Encourage IS.   Monitor with increased exertion 12. Polysubstance abuse + Cocaine and THC on admit : Has been  ongoing since 2015. Educate/encourage patient regarding life style changes.  13. Seizure disorder: Continue Keppra bid.   No breakthrough seizures since admission to rehab-7/1 14. Impaired fasting glucose/Hyperglycemia: Hgb A1c- 5.2.   Blood glucose mildly elevated on 6/28  Monitor with increased mobility 15. CKD stage III:   Cr.  1.38 on 6/28  Encourage fluids  Cont to monitor 16. Constipation  Bowel meds increased on 6/15, again on 6/16, decreased on 6/18, decreased again on 6/19  Improving 17.  Macrocytosis  Vitamin B12/folate within normal limits 18. Urinary retention/neurogenic bladder  Flomax increased on 6/23  Improving 19.  Atrial fibrillation  Rate controlled, noted on recent ECG as well 12.  Acute blood loss anemia-mild   Hemoglobin 12.9 on 6/30, labs ordered for tomorrow  Continue to monitor  LOS: 17 days A FACE TO FACE EVALUATION WAS PERFORMED  Lashanti Chambless Karis Juba 03/23/2020, 9:15 AM

## 2020-03-23 NOTE — Progress Notes (Signed)
Patient ID: Kevin [name], male   DOB: 07/02/1951, 69 y.o.   MRN: [id number]   Rolling walker and HB order placed through [employer]

## 2020-03-23 NOTE — Progress Notes (Signed)
Occupational Therapy Session Note  Patient Details  Name: Kevin [name] MRN: [id number] Date of Birth: 06-16-1951  Today's Date: 03/23/2020 OT Individual Time: 1130-1200 OT Individual Time Calculation (min): 30 min    Short Term Goals: Week 2:  OT Short Term Goal 1 (Week 2): Pt will complete UB/LB bathing in tub shower in sitting and standing with supervision. OT Short Term Goal 2 (Week 2): Pt will complete tub shower transfer with supervision using least restrictive AD. OT Short Term Goal 3 (Week 2): Pt will complete toilet transfer with supervision. OT Short Term Goal 4 (Week 2): Pt will complete LB dressing with supervision.  Skilled Therapeutic Interventions/Progress Updates:    Pt received in bed needing to toilet. He sat up and then ambulated with CGA and RW to toilet. Pt able to void BM.  He used R hand to cleanse self using a squat position with cues to fully attend to R leg as he tends to "sink" towards that side.   He cleansed fairly well ,but I completed to make sure he was fully clean.  Doffed shirt and washed up under arms and washed perineal area and bottom.   Ambulated to w/c to don new clothing with set up UB, [place] LB.  Pt set up with tray table to prepare for lunch. Chair alarm on and all needs met.    Therapy Documentation Precautions:  Precautions Precautions: Fall Precaution Comments: fatigues VERY easily, need to watch O2 sats, R hemiplegia and neglect, impaired cognition Restrictions Weight Bearing Restrictions: No    Vital Signs: Oxygen Therapy SpO2: 96 % O2 Device: Room Air Pain: Pain Assessment Pain Scale: 0-10 Pain Score: 0-No pain ADL: ADL Eating: Set up Where Assessed-Eating: Wheelchair Grooming: Setup Where Assessed-Grooming: Wheelchair Upper Body Bathing: Supervision/safety Where Assessed-Upper Body Bathing: Shower Lower Body Bathing: Supervision/safety Where Assessed-Lower Body Bathing: Shower Upper Body Dressing: Setup Where  Assessed-Upper Body Dressing: Edge of bed Lower Body Dressing: Contact guard Where Assessed-Lower Body Dressing: Edge of bed Toileting: Contact guard Where Assessed-Toileting: Glass blower/designer: Therapist, music Method: Counselling psychologist: Radiographer, therapeutic: Not assessed Social research officer, government: Curator Method: Heritage manager: Radio broadcast assistant    Therapy/Group: Individual Therapy  [place] 03/23/2020, 12:20 PM

## 2020-03-23 NOTE — Progress Notes (Signed)
Physical Therapy Session Note  Patient Details  Name: Kevin [name] MRN: [id number] Date of Birth: [birth year]  Today's Date: 03/23/2020 PT Individual Time: 0800-0900 PT Individual Time Calculation (min): 60 min   Short Term Goals: Week 3:  PT Short Term Goal 1 (Week 3): STG = LTG due to remaining LOS  Skilled Therapeutic Interventions/Progress Updates:   Pt received supine in bed and agreeable to PT. Supine>sit transfer with supervision assist and cues for decreased use of rails as tolerated. PT assisted pt to don socks EOB with max assist for time management. PT was able to don shoes with supervision assist.   Gait training in hall with RW 2x 243f to and from rehab gym and 1839f supervision assist with cues for improved heel contact to improve step height and length on the R. Dynamic gait training to step over 6 canes x 2 with RW, leading with the RLE on the first bout and leading with the LLE on the second.   Foot taps on 3" step 2x 8 BLE to one of 2 targets then one of 3 targets.  Lateral foot taps on 3" step x 10 BLE. BUE support on rails throughout and cues for oposture, improved knee flexion and increased stance width.   Nustep dynamic balance training to improve lateral weight shifting and COM control. Table tilt x 2 bouts with min assist and moderate cues for improved use of ankle strategy with noted difficulty with L lateral lean.   Patient returned to room and left sitting EOB with call bell in reach and all needs met.         Therapy Documentation Precautions:  Precautions Precautions: Fall Precaution Comments: fatigues VERY easily, need to watch O2 sats, R hemiplegia and neglect, impaired cognition Restrictions Weight Bearing Restrictions: No Vital Signs: Therapy Vitals Temp: 98.1 F (36.7 C) Pulse Rate: 91 Resp: 18 BP: 124/79 Patient Position (if appropriate): Lying Oxygen Therapy SpO2: 96 % O2 Device: Room Air Pain: denies   Therapy/Group: Individual  Therapy  AuLorie Phenix/09/2019, 9:07 AM

## 2020-03-24 ENCOUNTER — Inpatient Hospital Stay (HOSPITAL_COMMUNITY): Payer: Medicare HMO | Admitting: Physical Therapy

## 2020-03-24 ENCOUNTER — Ambulatory Visit (HOSPITAL_COMMUNITY): Payer: Medicare HMO | Admitting: Physical Therapy

## 2020-03-24 ENCOUNTER — Encounter (HOSPITAL_COMMUNITY): Payer: Medicare HMO | Admitting: Speech Pathology

## 2020-03-24 ENCOUNTER — Encounter (HOSPITAL_COMMUNITY): Payer: Medicare HMO | Admitting: Occupational Therapy

## 2020-03-24 LAB — CBC WITH DIFFERENTIAL/PLATELET
Abs Immature Granulocytes: 0.02 10*3/uL (ref 0.00–0.07)
Basophils Absolute: 0 10*3/uL (ref 0.0–0.1)
Basophils Relative: 0 %
Eosinophils Absolute: 0.2 10*3/uL (ref 0.0–0.5)
Eosinophils Relative: 2 %
HCT: 37.6 % — ABNORMAL LOW (ref 39.0–52.0)
Hemoglobin: 12.6 g/dL — ABNORMAL LOW (ref 13.0–17.0)
Immature Granulocytes: 0 %
Lymphocytes Relative: 4 %
Lymphs Abs: 0.3 10*3/uL — ABNORMAL LOW (ref 0.7–4.0)
MCH: 33.8 pg (ref 26.0–34.0)
MCHC: 33.5 g/dL (ref 30.0–36.0)
MCV: 100.8 fL — ABNORMAL HIGH (ref 80.0–100.0)
Monocytes Absolute: 1 10*3/uL (ref 0.1–1.0)
Monocytes Relative: 13 %
Neutro Abs: 6 10*3/uL (ref 1.7–7.7)
Neutrophils Relative %: 81 %
Platelets: 58 10*3/uL — ABNORMAL LOW (ref 150–400)
RBC: 3.73 MIL/uL — ABNORMAL LOW (ref 4.22–5.81)
RDW: 11.4 % — ABNORMAL LOW (ref 11.5–15.5)
WBC: 7.4 10*3/uL (ref 4.0–10.5)
nRBC: 0 % (ref 0.0–0.2)

## 2020-03-24 NOTE — Progress Notes (Signed)
Speech Language Pathology Daily Session Note  Patient Details  Name: Kevin [name] MRN: [id number] Date of Birth: [birth year]  Today's Date: 03/24/2020 SLP Individual Time: 1100-1158 SLP Individual Time Calculation (min): 58 min  Short Term Goals: Week 3: SLP Short Term Goal 1 (Week 3): STG=LTG due to remaining length of stay  Skilled Therapeutic Interventions: Pt was seen for skilled ST targeting cognitive goals and finishing education with pt's wife (caregiver). SLP provided review of appropriate activites that could be targets at home to facilitated cognitive stimulation and reorganization, per wife's request. Also discussed impact of reduced problem solving, judgement, auditory processing, and short term memory on daily functioning and recommendation for 24/7 supervision for greatest safety. Verbal review and handout provided regarding compensatory memory strategies. SPL further facilitated session with re-administration of Cognistat cognitive evaluation to assess progress while inpatient. Pt's performance drastically improved since initial evaluation; scores that fell outside of normal limits included memory (8-mild), calculations (1-moderate), similarities/abstract thinking (4-mild), judgement (3-mild). On eval, pt scored severe within all subtests. Pt's wife reports she feels that observing retesting today gives her a better idea of pt's current level of functioning. Pt left sitting in chair with alarm set and needs within reach, wife still present. Continue per current plan of care.        Pain Pain Assessment Pain Scale: 0-10 Pain Score: 0-No pain  Therapy/Group: Individual Therapy  [name] 03/24/2020, 7:11 AM

## 2020-03-24 NOTE — Progress Notes (Signed)
Orthopedic Tech Progress Note Patient Details:  Kevin [name] 06-13-1951 [id number] Called in order to [facility] for an AFO Patient ID: Kevin [name], male   DOB: [birth year], 69 y.o.   MRN: [id number]   [name] 03/24/2020, 3:13 PM

## 2020-03-24 NOTE — Progress Notes (Signed)
Speech Language Pathology Discharge Summary  Patient Details  Name: Kevin [name] MRN: [id number] Date of Birth: 03/06/1951  Today's Date: 03/27/2020 SLP Individual Time: 1305-1400 SLP Individual Time Calculation (min): 55 min   Skilled Therapeutic Interventions:   Skilled ST services focused on education and cognitive skills. SLP provided education of memory strategies, including WRAP. All questions were answered to satisfaction and hand out was given. SLP facilitated semi-complex problem solving , error awareness and recall in account balancing task, pt required mod A fade to min A verbal cues for organization and error awareness. SLP reviewed current medication list with pt, pt required extensive education on consuming bladder medication x4 a day and what to do if medication is missed. Pt's nurse provided further education. Pt expressed possible non-compliance with bladder medication, stating " when I leave I will take it 3 times a day." SLP provided education and encouraged pt to speak to PA at discharge about adjusting medication, Pt agreed. Pt was left in room with call bell within reach and chair alarm set.     Patient has met 6 of 6 long term goals.  Patient to discharge at overall Supervision;Min level.  Reasons goals not met: n/a   Clinical Impression/Discharge Summary:   Pt made functional gains and met 6 out of 6 long term goals this admission. Pt currently requires Supervision assist for use of compensatory strategies for short term memory and recall/carryover of day to day information/cognitive-linguistic strategies, as well as selective attention and comprehension of basic auditory information. Pt does have mild hearing loss and working memory deficits, which further impacts auditory comprehension. Pt requires increased Min A for basic to mildly complex problem solving, and emergent awareness. Due to current level of cognitive-linguistic function, pt will require 24/7 supervision.  Pt has demonstrated greatly improved comprehension, ability to follow multi-step directions, basic problem solving, attention, and recall during his stay. However, given cognitive deficits still present, recommend pt continue to receive skilled ST services upon discharge. Pt and family education is complete at this time.    Care Partner:  Caregiver Able to Provide Assistance: Yes  Type of Caregiver Assistance: Cognitive  Recommendation:  Outpatient SLP;24 hour supervision/assistance  Rationale for SLP Follow Up: Maximize cognitive function and independence;Reduce caregiver burden   Equipment: none   Reasons for discharge: Discharged from hospital   Patient/Family Agrees with Progress Made and Goals Achieved: Yes    [place]  CRATCH 03/27/2020, 3:27 PM    

## 2020-03-24 NOTE — Progress Notes (Signed)
Physical Therapy Session Note  Patient Details  Name: Kevin [name] MRN: [id number] Date of Birth: 1951-03-01  Today's Date: 03/24/2020 PT Individual Time: 0903-1000 PT Individual Time Calculation (min): 57 min   Short Term Goals: Week 3:  PT Short Term Goal 1 (Week 3): STG = LTG due to remaining LOS  Skilled Therapeutic Interventions/Progress Updates:    Patient received in bed, pleasant and willing to participate in session and asking to use the rest room. Able to complete all bed mobility and functional transfers with RW and general S with occasional cues for safety and sequencing especially when turning with RW. He remained insistent that he does not need AFO because his foot does not scuff and drag without his shoes- ran gait trail in the hallway with hospital socks and he continues to demonstrate R foot scuffing, dragging, and inattention; PT provided further education on need for AFO and spouse fully supportive of getting brace. Tolerated gait training multiple distances of 236f with RW/S and cues for R attention/gait mechanics to reduce toe catching/scuffing. Demonstration and education provided to wife with gait training, functional transfers, stair navigation, car transfers, and AFO application- she was able to guard correctly and provide appropriate cues for all mobility today. [facility] and [facility] present, all needs otherwise met this morning.   Therapy Documentation Precautions:  Precautions Precautions: Fall Precaution Comments: fatigues VERY easily, need to watch O2 sats, R hemiplegia and neglect, impaired cognition Restrictions Weight Bearing Restrictions: No Pain: Pain Assessment Pain Scale: 0-10 Pain Score: 0-No pain    Therapy/Group: Individual Therapy   KWindell Norfolk DPT, PN1   Supplemental Physical Therapist [place]   Pager [phone] Rehab Office [phone]   03/24/2020, 12:16 PM

## 2020-03-24 NOTE — Progress Notes (Signed)
Occupational Therapy Weekly Progress Note  Patient Details  Name: Kevin [name] MRN: [id number] Date of Birth: [birth year]  Beginning of progress report period: March 16, 2020 End of progress report period: March 24, 2020  Today's Date: 03/24/2020 OT Individual Time: 1000-1100 OT Individual Time Calculation (min): 60 min    Patient has met 4 of 4 short term goals.  Pt has been progressing well and is now S with most of his self care and transfers. The one area he is still struggling with is with adequate cleansing after having a bowel movement.  He likes to use his R hand and has difficulty with the coordination of rotating his forearm and flexing his wrist. He is able to reach there but is not able to put enough pressure to clean fully.  Patient continues to demonstrate the following deficits: muscle weakness, decreased cardiorespiratoy endurance, unbalanced muscle activation and decreased coordination, decreased problem solving and decreased memory and decreased standing balance and decreased balance strategies and therefore will continue to benefit from skilled OT intervention to enhance overall performance with BADL.  Patient progressing toward long term goals..  Continue plan of care.  OT Short Term Goals Week 1:  OT Short Term Goal 1 (Week 1): Pt will donn/doff shirt with min assist OT Short Term Goal 1 - Progress (Week 1): Met OT Short Term Goal 2 (Week 1): Pt will bathe LB with mod assist OT Short Term Goal 2 - Progress (Week 1): Met OT Short Term Goal 3 (Week 1): Pt will complete toilet transfer using BSC with min assist OT Short Term Goal 3 - Progress (Week 1): Met OT Short Term Goal 4 (Week 1): Pt will complete LB dressing with mod assist. OT Short Term Goal 4 - Progress (Week 1): Met Week 2:  OT Short Term Goal 1 (Week 2): Pt will complete UB/LB bathing in tub shower in sitting and standing with supervision. OT Short Term Goal 1 - Progress (Week 2): Met OT Short Term Goal 2  (Week 2): Pt will complete tub shower transfer with supervision using least restrictive AD. OT Short Term Goal 2 - Progress (Week 2): Met OT Short Term Goal 3 (Week 2): Pt will complete toilet transfer with supervision. OT Short Term Goal 3 - Progress (Week 2): Met OT Short Term Goal 4 (Week 2): Pt will complete LB dressing with supervision. OT Short Term Goal 4 - Progress (Week 2): Met Week 3:  OT Short Term Goal 1 (Week 3): STGs = LTGs  Skilled Therapeutic Interventions/Progress Updates:    Pt seen for ADL training and family education with spouse.  This the 2nd time she has observed and she feels very comfortable assisting him with supervision.   Discussed the need to have grab bars installed in tub as the clamp on bar will not work with her textured tile.  She will purchase a tub bench and BSC on her own.  Pt did very well today. Discussed his continued difficulty with cleansing and suggested they try wet wipes.  Pt did need cues to stop and rest and he would get short of breath easily.  Pt resting in wc with seat alarm on and all needs met.   Therapy Documentation Precautions:  Precautions Precautions: Fall Precaution Comments: fatigues VERY easily, need to watch O2 sats, R hemiplegia and neglect, impaired cognition Restrictions Weight Bearing Restrictions: No      Pain: Pain Assessment Pain Scale: 0-10 Pain Score: 0-No pain ADL: ADL Eating: Set up  Where Assessed-Eating: Wheelchair Grooming: Setup Where Assessed-Grooming: Clinical biochemist Bathing: Supervision/safety Where Assessed-Upper Body Bathing: Shower Lower Body Bathing: Supervision/safety Where Assessed-Lower Body Bathing: Shower Upper Body Dressing: Setup Where Assessed-Upper Body Dressing: Edge of bed Lower Body Dressing: Supervision/safety Where Assessed-Lower Body Dressing: Edge of bed Toileting: Contact guard Where Assessed-Toileting: Glass blower/designer: Close supervision Toilet Transfer  Method: Counselling psychologist: Ambulance person Transfer: Close supervison Clinical cytogeneticist Method: Optometrist: Radio broadcast assistant, Energy manager: Close supervision Social research officer, government Method: Heritage manager: Radio broadcast assistant  Therapy/Group: Individual Therapy  Mistee Soliman 03/24/2020, 1:12 PM

## 2020-03-24 NOTE — Progress Notes (Signed)
Physical Therapy Session Note  Patient Details  Name: Kevin [name] MRN: [id number] Date of Birth: Nov 18, 1950  Today's Date: 03/24/2020 PT Individual Time: 1502-1523 PT Individual Time Calculation (min): 21 min   Short Term Goals: Week 3:  PT Short Term Goal 1 (Week 3): STG = LTG due to remaining LOS  Skilled Therapeutic Interventions/Progress Updates:    Patient received asleep, easily woken and willing to work with therapy but seeming more confused and disoriented than usual (wife reports this is normal for him until he fully wakes up). Meghan from [facility] present and performed AFO consult- patient able to gait train approximately 100-139f with RW and close min guard due to increased confusion and impulsivity this afternoon. After AFO consult was finished, patient refused to do any more with therapy due to fatigue. Left in bed with all needs met, bed alarm active this afternoon. 9 minutes of skilled PT services missed due to patient refusal.   Therapy Documentation Precautions:  Precautions Precautions: Fall Precaution Comments: fatigues VERY easily, need to watch O2 sats, R hemiplegia and neglect, impaired cognition Restrictions Weight Bearing Restrictions: No General: PT Amount of Missed Time (min): 9 Minutes PT Missed Treatment Reason: Patient unwilling to participate;Patient fatigue Pain: Pain Assessment Pain Scale: 0-10 Pain Score: 0-No pain    Therapy/Group: Individual Therapy   KWindell Norfolk DPT, PN1   Supplemental Physical Therapist [place]   Pager [phone] Rehab Office [phone]   03/24/2020, 3:48 PM

## 2020-03-24 NOTE — Progress Notes (Signed)
[facility] PHYSICAL MEDICINE & REHABILITATION PROGRESS NOTE  Subjective/Complaints: Patient seen sitting up in bed this morning.  He states he slept well overnight.  He is looking forward to discharge next week.  He is pleasant and jovial this morning.  ROS: Denies CP, SOB, N/V/D  Objective: Vital Signs: Blood pressure (!) 156/98, pulse 71, temperature 97.9 F (36.6 C), resp. rate 18, height 6' (1.829 m), weight 101.5 kg, SpO2 100 %. No results found. Recent Labs    03/22/20 0624 03/24/20 0559  WBC 8.6 7.4  HGB 12.9* 12.6*  HCT 38.5* 37.6*  PLT 55* 58*   No results for input(s): NA, K, CL, CO2, GLUCOSE, BUN, CREATININE, CALCIUM in the last 72 hours.  Physical Exam: BP (!) 156/98 (BP Location: Left Arm)   Pulse 71   Temp 97.9 F (36.6 C)   Resp 18   Ht 6' (1.829 m)   Wt 101.5 kg   SpO2 100%   BMI 30.35 kg/m   Constitutional: No distress . Vital signs reviewed. HENT: Normocephalic.  Atraumatic. Eyes: EOMI. No discharge. Cardiovascular: No JVD. Respiratory: Normal effort.  No stridor. GI: Non-distended. Skin: Warm and dry.  Intact. Psych: Normal mood.  Normal behavior. Musc: No edema in extremities.  No tenderness in extremities. Neuro: Alert HOH Right facial weakness, unchanged Dysarthria, unchanged Motor:  RUE: 4+/5 proximal to distal, improving RLE: 4+/5 proximal to distal with apraxia, improving. Right inattention improving  Assessment/Plan: 1. Functional deficits secondary to left MCA/ACA infarcts which require 3+ hours per day of interdisciplinary therapy in a comprehensive inpatient rehab setting.  Physiatrist is providing close team supervision and 24 hour management of active medical problems listed below.  Physiatrist and rehab team continue to assess barriers to discharge/monitor patient progress toward functional and medical goals  Care Tool:  Bathing    Body parts bathed by patient: Right arm, Left arm, Chest, Abdomen, Front perineal area,  Left upper leg, Face, Right upper leg, Buttocks, Left lower leg, Right lower leg   Body parts bathed by helper: Buttocks Body parts n/a: Right lower leg, Left lower leg   Bathing assist Assist Level: Supervision/Verbal cueing     Upper Body Dressing/Undressing Upper body dressing Upper body dressing/undressing activity did not occur (including orthotics): N/A What is the patient wearing?: Pull over shirt    Upper body assist Assist Level: Set up assist    Lower Body Dressing/Undressing Lower body dressing    Lower body dressing activity did not occur: N/A What is the patient wearing?: Underwear/pull up, Pants     Lower body assist Assist for lower body dressing: Contact Guard/Touching assist     Toileting Toileting Toileting Activity did not occur (Clothing management and hygiene only): N/A (no void or bm)  Toileting assist Assist for toileting: Contact Guard/Touching assist     Transfers Chair/bed transfer  Transfers assist     Chair/bed transfer assist level: Supervision/Verbal cueing     Locomotion Ambulation   Ambulation assist      Assist level: Contact Guard/Touching assist Assistive device: Walker-rolling Max distance: 171ft   Walk 10 feet activity   Assist  Walk 10 feet activity did not occur: Safety/medical concerns (fatigue)  Assist level: Supervision/Verbal cueing Assistive device: Walker-rolling   Walk 50 feet activity   Assist Walk 50 feet with 2 turns activity did not occur: Safety/medical concerns (fatigue)  Assist level: Supervision/Verbal cueing Assistive device: Walker-rolling    Walk 150 feet activity   Assist Walk 150 feet activity did not  occur: Safety/medical concerns (fatigue)  Assist level: Contact Guard/Touching assist Assistive device: Walker-rolling    Walk 10 feet on uneven surface  activity   Assist Walk 10 feet on uneven surfaces activity did not occur: Safety/medical concerns (fatigue)          Wheelchair     Assist Will patient use wheelchair at discharge?:  (tbd) Type of Wheelchair: Manual    Wheelchair assist level: Supervision/Verbal cueing Max wheelchair distance: 110ft    Wheelchair 50 feet with 2 turns activity    Assist        Assist Level: Supervision/Verbal cueing   Wheelchair 150 feet activity     Assist     Assist Level: Supervision/Verbal cueing      Medical Problem List and Plan: 1. Right sided hemiparesis with poor safety awareness and decrease in problem solving affecting mobility and ADLs secondary to Left ACA/MCA watershed infarct, small left basal ganglia and thalamic infarct, left parietal occipital infarct on 03/02/20.  Continue CIR 2. Antithrombotics: -DVT/anticoagulation:Pharmaceutical:Other (comment)--Eliquis. -antiplatelet therapy: N/A 3. Pain Management:N/A 4. Mood:LCSW to follow for evaluation and support. -antipsychotic agents: N/a 5. Neuropsych: This patientisnot fully capable of making decisions on hisown behalf. 6. Skin/Wound Care:Routine pressure relief measures. 7. Fluids/Electrolytes/Nutrition:Monitor I/Os 8. HTN: Monitor BP   Lasix/Entresto/aldactone resumed 06/14.   Coreg decreased on 6/25  Controlled on 7/2  Monitor with increased mobility 9. Chronic systolic CHF: Monitor Monitor for signs of overload. Continue Coreg bid, Lasix, Entresto,aldactone and Lipitor.   No ASA due to low platelets.    Filed Weights   03/18/20 0500 03/19/20 0500 03/24/20 0431  Weight: 89.7 kg 88.9 kg 101.5 kg   ?  Reliability on 7/2 10. Cirrhosis of the liver with chronic thrombocytopenia: Monitor for signs of bleeding or decompensation.   LFTs WNL on 6/15  Plts 58 on 7/2 11. Chronic SOB: Multifactorial--? OSA. On Trilegy for reactive airway disease. Duo nebs prn. Encourage IS.   Monitor with increased exertion 12. Polysubstance abuse + Cocaine and THC on admit : Has been ongoing since  2015. Educate/encourage patient regarding life style changes.  13. Seizure disorder: Continue Keppra bid.   No breakthrough seizures since admission to rehab- 7/2 14. Impaired fasting glucose/Hyperglycemia: Hgb A1c- 5.2.   Blood glucose mildly elevated on 6/28  Monitor with increased mobility 15. CKD stage III:   Cr.  1.38 on 6/28  Encourage fluids  Cont to monitor 16. Constipation  Bowel meds increased on 6/15, again on 6/16, decreased on 6/18, decreased again on 6/19  Improving 17.  Macrocytosis  Vitamin B12/folate within normal limits 18. Urinary retention/neurogenic bladder  Flomax increased on 6/23  Improved 19.  Atrial fibrillation  Rate controlled, noted on recent ECG as well 12.  Acute blood loss anemia-mild   Vitamin B12/folate within normal limits  Hemoglobin 12.6 on 7/2  Continue to monitor  LOS: 18 days A FACE TO FACE EVALUATION WAS PERFORMED  Clara Herbison Karis Juba 03/24/2020, 10:09 AM

## 2020-03-25 ENCOUNTER — Inpatient Hospital Stay (HOSPITAL_COMMUNITY): Payer: Medicare HMO | Admitting: Occupational Therapy

## 2020-03-25 NOTE — Procedures (Deleted)
Patient declined CPAP for tonight.  

## 2020-03-25 NOTE — Progress Notes (Signed)
[facility] PHYSICAL MEDICINE & REHABILITATION PROGRESS NOTE  Subjective/Complaints:   ROS: Denies CP, SOB, N/V/D  Objective: Vital Signs: Blood pressure (!) 147/91, pulse 84, temperature 98.2 F (36.8 C), temperature source Oral, resp. rate 18, height 6' (1.829 m), weight 101 kg, SpO2 99 %. No results found. Recent Labs    03/24/20 0559  WBC 7.4  HGB 12.6*  HCT 37.6*  PLT 58*   No results for input(s): NA, K, CL, CO2, GLUCOSE, BUN, CREATININE, CALCIUM in the last 72 hours.  Physical Exam: BP (!) 147/91 (BP Location: Left Arm)   Pulse 84   Temp 98.2 F (36.8 C) (Oral)   Resp 18   Ht 6' (1.829 m)   Wt 101 kg   SpO2 99%   BMI 30.20 kg/m    General: No acute distress Mood and affect are appropriate Heart: Regular rate and rhythm no rubs murmurs or extra sounds Lungs: Clear to auscultation, breathing unlabored, no rales or wheezes Abdomen: Positive bowel sounds, soft nontender to palpation, nondistended Extremities: No clubbing, cyanosis, or edema  Neuro: Alert HOH Right facial weakness, unchanged Dysarthria, unchanged Motor:  RUE: 4+/5 proximal to distal, improving RLE: 4+/5 proximal to distal with apraxia, improving. Right inattention improving  Assessment/Plan: 1. Functional deficits secondary to left MCA/ACA infarcts which require 3+ hours per day of interdisciplinary therapy in a comprehensive inpatient rehab setting.  Physiatrist is providing close team supervision and 24 hour management of active medical problems listed below.  Physiatrist and rehab team continue to assess barriers to discharge/monitor patient progress toward functional and medical goals  Care Tool:  Bathing    Body parts bathed by patient: Right arm, Left arm, Chest, Abdomen, Front perineal area, Left upper leg, Face, Right upper leg, Buttocks, Left lower leg, Right lower leg   Body parts bathed by helper: Buttocks Body parts n/a: Right lower leg, Left lower leg   Bathing assist  Assist Level: Supervision/Verbal cueing     Upper Body Dressing/Undressing Upper body dressing Upper body dressing/undressing activity did not occur (including orthotics): N/A What is the patient wearing?: Pull over shirt    Upper body assist Assist Level: Set up assist    Lower Body Dressing/Undressing Lower body dressing    Lower body dressing activity did not occur: N/A What is the patient wearing?: Underwear/pull up, Pants     Lower body assist Assist for lower body dressing: Supervision/Verbal cueing     Toileting Toileting Toileting Activity did not occur (Clothing management and hygiene only): N/A (no void or bm)  Toileting assist Assist for toileting: Contact Guard/Touching assist     Transfers Chair/bed transfer  Transfers assist     Chair/bed transfer assist level: Supervision/Verbal cueing     Locomotion Ambulation   Ambulation assist      Assist level: Supervision/Verbal cueing Assistive device: Walker-rolling Max distance: 217ft   Walk 10 feet activity   Assist  Walk 10 feet activity did not occur: Safety/medical concerns (fatigue)  Assist level: Supervision/Verbal cueing Assistive device: Walker-rolling   Walk 50 feet activity   Assist Walk 50 feet with 2 turns activity did not occur: Safety/medical concerns (fatigue)  Assist level: Supervision/Verbal cueing Assistive device: Walker-rolling    Walk 150 feet activity   Assist Walk 150 feet activity did not occur: Safety/medical concerns (fatigue)  Assist level: Supervision/Verbal cueing Assistive device: Walker-rolling    Walk 10 feet on uneven surface  activity   Assist Walk 10 feet on uneven surfaces activity did not occur:  Safety/medical concerns (fatigue)         Wheelchair     Assist Will patient use wheelchair at discharge?:  (tbd) Type of Wheelchair: Manual    Wheelchair assist level: Supervision/Verbal cueing Max wheelchair distance: 115ft     Wheelchair 50 feet with 2 turns activity    Assist        Assist Level: Supervision/Verbal cueing   Wheelchair 150 feet activity     Assist     Assist Level: Supervision/Verbal cueing      Medical Problem List and Plan: 1. Right sided hemiparesis with poor safety awareness and decrease in problem solving affecting mobility and ADLs secondary to Left ACA/MCA watershed infarct, small left basal ganglia and thalamic infarct, left parietal occipital infarct on 03/02/20.  Continue CIR PT, OT, SLP  2. Antithrombotics: -DVT/anticoagulation:Pharmaceutical:Other (comment)--Eliquis. -antiplatelet therapy: N/A 3. Pain Management:N/A 4. Mood:LCSW to follow for evaluation and support. -antipsychotic agents: N/a 5. Neuropsych: This patientisnot fully capable of making decisions on hisown behalf. 6. Skin/Wound Care:Routine pressure relief measures. 7. Fluids/Electrolytes/Nutrition:Monitor I/Os 8. HTN: Monitor BP   Lasix/Entresto/aldactone resumed 06/14.   Coreg decreased on 6/25   Vitals:   03/24/20 1940 03/25/20 0444  BP: 137/86 (!) 147/91  Pulse: 87 84  Resp: 18 18  Temp: 98.2 F (36.8 C) 98.2 F (36.8 C)  SpO2: 97% 99%   .vit  Monitor with increased mobility 9. Chronic systolic CHF: Monitor Monitor for signs of overload. Continue Coreg bid, Lasix, Entresto,aldactone and Lipitor.   No ASA due to low platelets.    Filed Weights   03/19/20 0500 03/24/20 0431 03/25/20 0444  Weight: 88.9 kg 101.5 kg 101 kg   ?  Reliability on 7/2 10. Cirrhosis of the liver with chronic thrombocytopenia: Monitor for signs of bleeding or decompensation.   LFTs WNL on 6/15  Plts 58 on 7/2 11. Chronic SOB: Multifactorial--? OSA. On Trilegy for reactive airway disease. Duo nebs prn. Encourage IS.   Monitor with increased exertion 12. Polysubstance abuse + Cocaine and THC on admit : Has been ongoing since 2015. Educate/encourage patient regarding  life style changes.  13. Seizure disorder: Continue Keppra bid.   No breakthrough seizures since admission to rehab- 7/2 14. Impaired fasting glucose/Hyperglycemia: Hgb A1c- 5.2.  Marland Kitchencbg  Monitor with increased mobility 15. CKD stage III:   Cr.  1.38 on 6/28  Encourage fluids  Cont to monitor 16. Constipation  Bowel meds increased on 6/15, again on 6/16, decreased on 6/18, decreased again on 6/19  Improving 17.  Macrocytosis  Vitamin B12/folate within normal limits 18. Urinary retention/neurogenic bladder  Flomax increased on 6/23  Improved 19.  Atrial fibrillation  Rate controlled, noted on recent ECG as well  12.  Acute blood loss anemia-mild   Vitamin B12/folate within normal limits  Hemoglobin 12.6 on 7/2  Continue to monitor  LOS: 19 days A FACE TO FACE EVALUATION WAS PERFORMED  Erick Colace 03/25/2020, 10:03 AM

## 2020-03-25 NOTE — Progress Notes (Signed)
Occupational Therapy Session Note  Patient Details  Name: Kevin [name] MRN: [id number] Date of Birth: [birth year]  Today's Date: 03/25/2020 OT Individual Time: 4132-4401 OT Individual Time Calculation (min): 60 min    Short Term Goals: Week 2:  OT Short Term Goal 1 (Week 2): Pt will complete UB/LB bathing in tub shower in sitting and standing with supervision. OT Short Term Goal 1 - Progress (Week 2): Met OT Short Term Goal 2 (Week 2): Pt will complete tub shower transfer with supervision using least restrictive AD. OT Short Term Goal 2 - Progress (Week 2): Met OT Short Term Goal 3 (Week 2): Pt will complete toilet transfer with supervision. OT Short Term Goal 3 - Progress (Week 2): Met OT Short Term Goal 4 (Week 2): Pt will complete LB dressing with supervision. OT Short Term Goal 4 - Progress (Week 2): Met Week 3:  OT Short Term Goal 1 (Week 3): STGs = LTGs  Skilled Therapeutic Interventions/Progress Updates:    patient in bed, alert, pleasant and cooperative.  He denies pain at this time.  Supine to sitting with CS.   Sit to stand and short distance ambulation with RW CGA to/from bed, w/c,  Grooming tasks completed seated at sink - mod I,   UB bathing and dressing with set up, footwear set up/CS.   Patient ambulated to/from therapy gym with RW CGA, cues for weight shift and right toe clearance.  Completed seated and standing balance/core/coordination and motor control activities with focus on symmetry and proximal strength.  Returned to bed at close of session.  Bed alarm set and call bell/tray table in reach.      Therapy Documentation Precautions:  Precautions Precautions: Fall Precaution Comments: fatigues VERY easily, need to watch O2 sats, R hemiplegia and neglect, impaired cognition Restrictions Weight Bearing Restrictions: No   Therapy/Group: Individual Therapy  Carlos Levering 03/25/2020, 7:34 AM

## 2020-03-26 NOTE — Progress Notes (Signed)
[facility] PHYSICAL MEDICINE & REHABILITATION PROGRESS NOTE  Subjective/Complaints:   No issues overnite except felt like the CPAP was "pushing in too much air"  ROS: Denies CP, SOB, N/V/D  Objective: Vital Signs: Blood pressure (!) 148/96, pulse 84, temperature 98.2 F (36.8 C), temperature source Oral, resp. rate 18, height 6' (1.829 m), weight 100.9 kg, SpO2 98 %. No results found. Recent Labs    03/24/20 0559  WBC 7.4  HGB 12.6*  HCT 37.6*  PLT 58*   No results for input(s): NA, K, CL, CO2, GLUCOSE, BUN, CREATININE, CALCIUM in the last 72 hours.  Physical Exam: BP (!) 148/96 (BP Location: Right Arm)   Pulse 84   Temp 98.2 F (36.8 C) (Oral)   Resp 18   Ht 6' (1.829 m)   Wt 100.9 kg   SpO2 98%   BMI 30.17 kg/m    General: No acute distress Mood and affect are appropriate Heart: Regular rate and rhythm no rubs murmurs or extra sounds Lungs: Clear to auscultation, breathing unlabored, no rales or wheezes Abdomen: Positive bowel sounds, soft nontender to palpation, nondistended Extremities: No clubbing, cyanosis, or edema  Neuro: Alert HOH Right facial weakness, unchanged Dysarthria, unchanged Motor:  RUE: 4+/5 proximal to distal, improving RLE: 4+/5 proximal to distal with apraxia, improving.  Assessment/Plan: 1. Functional deficits secondary to left MCA/ACA infarcts which require 3+ hours per day of interdisciplinary therapy in a comprehensive inpatient rehab setting.  Physiatrist is providing close team supervision and 24 hour management of active medical problems listed below.  Physiatrist and rehab team continue to assess barriers to discharge/monitor patient progress toward functional and medical goals  Care Tool:  Bathing    Body parts bathed by patient: Right arm, Left arm, Chest, Abdomen, Front perineal area, Left upper leg, Face, Right upper leg, Buttocks, Left lower leg, Right lower leg   Body parts bathed by helper: Buttocks Body parts n/a:  Right lower leg, Left lower leg   Bathing assist Assist Level: Supervision/Verbal cueing     Upper Body Dressing/Undressing Upper body dressing Upper body dressing/undressing activity did not occur (including orthotics): N/A What is the patient wearing?: Pull over shirt    Upper body assist Assist Level: Set up assist    Lower Body Dressing/Undressing Lower body dressing    Lower body dressing activity did not occur: N/A What is the patient wearing?: Underwear/pull up, Pants     Lower body assist Assist for lower body dressing: Supervision/Verbal cueing     Toileting Toileting Toileting Activity did not occur (Clothing management and hygiene only): N/A (no void or bm)  Toileting assist Assist for toileting: Contact Guard/Touching assist     Transfers Chair/bed transfer  Transfers assist     Chair/bed transfer assist level: Supervision/Verbal cueing     Locomotion Ambulation   Ambulation assist      Assist level: Supervision/Verbal cueing Assistive device: Walker-rolling Max distance: 247ft   Walk 10 feet activity   Assist  Walk 10 feet activity did not occur: Safety/medical concerns (fatigue)  Assist level: Supervision/Verbal cueing Assistive device: Walker-rolling   Walk 50 feet activity   Assist Walk 50 feet with 2 turns activity did not occur: Safety/medical concerns (fatigue)  Assist level: Supervision/Verbal cueing Assistive device: Walker-rolling    Walk 150 feet activity   Assist Walk 150 feet activity did not occur: Safety/medical concerns (fatigue)  Assist level: Supervision/Verbal cueing Assistive device: Walker-rolling    Walk 10 feet on uneven surface  activity  Assist Walk 10 feet on uneven surfaces activity did not occur: Safety/medical concerns (fatigue)         Wheelchair     Assist Will patient use wheelchair at discharge?:  (tbd) Type of Wheelchair: Manual    Wheelchair assist level: Supervision/Verbal  cueing Max wheelchair distance: 143ft    Wheelchair 50 feet with 2 turns activity    Assist        Assist Level: Supervision/Verbal cueing   Wheelchair 150 feet activity     Assist     Assist Level: Supervision/Verbal cueing      Medical Problem List and Plan: 1. Right sided hemiparesis with poor safety awareness and decrease in problem solving affecting mobility and ADLs secondary to Left ACA/MCA watershed infarct, small left basal ganglia and thalamic infarct, left parietal occipital infarct on 03/02/20.  Continue CIR PT, OT, SLP  2. Antithrombotics: -DVT/anticoagulation:Pharmaceutical:Other (comment)--Eliquis. -antiplatelet therapy: N/A 3. Pain Management:N/A 4. Mood:LCSW to follow for evaluation and support. -antipsychotic agents: N/a 5. Neuropsych: This patientisnot fully capable of making decisions on hisown behalf. 6. Skin/Wound Care:Routine pressure relief measures. 7. Fluids/Electrolytes/Nutrition:Monitor I/Os 8. HTN: Monitor BP   Lasix/Entresto/aldactone resumed 06/14.   Coreg decreased on 6/25   Vitals:   03/26/20 0132 03/26/20 0513  BP:  (!) 148/96  Pulse: 91 84  Resp: 18 18  Temp:  98.2 F (36.8 C)  SpO2: 97% 98%  HR and BP a little up , may need to increase Coreg check ortho vitals first    Monitor with increased mobility 9. Chronic systolic CHF: Monitor Monitor for signs of overload. Continue Coreg bid, Lasix, Entresto,aldactone and Lipitor.   No ASA due to low platelets.    Filed Weights   03/24/20 0431 03/25/20 0444 03/26/20 0513  Weight: 101.5 kg 101 kg 100.9 kg   ?  Reliability on 7/2 10. Cirrhosis of the liver with chronic thrombocytopenia: Monitor for signs of bleeding or decompensation.   LFTs WNL on 6/15  Plts 58 on 7/2 11. Chronic SOB: Multifactorial--? OSA. On Trilegy for reactive airway disease. Duo nebs prn. Encourage IS.   Monitor with increased exertion 12. Polysubstance abuse +  Cocaine and THC on admit : Has been ongoing since 2015. Educate/encourage patient regarding life style changes.  13. Seizure disorder: Continue Keppra bid.   No breakthrough seizures since admission to rehab- 7/2 14. Impaired fasting glucose/Hyperglycemia: Hgb A1c- 5.2.  Marland Kitchencbg  Monitor with increased mobility 15. CKD stage III:   Cr.  1.38 on 6/28  Encourage fluids  Cont to monitor 16. Constipation  Bowel meds increased on 6/15, again on 6/16, decreased on 6/18, decreased again on 6/19  Improving 17.  Macrocytosis  Vitamin B12/folate within normal limits 18. Urinary retention/neurogenic bladder  Flomax increased on 6/23  Improved 19.  Atrial fibrillation  Rate controlled, noted on recent ECG as well  12.  Acute blood loss anemia-mild   Vitamin B12/folate within normal limits  Hemoglobin 12.6 on 7/2  Continue to monitor  LOS: 20 days A FACE TO FACE EVALUATION WAS PERFORMED  Erick Colace 03/26/2020, 9:07 AM

## 2020-03-26 NOTE — Progress Notes (Signed)
RT placed pt on CPAP dream station for the night in auto titrate of max 20 min 5 w/no oxygen bled into the system. Pt respiratory status is stable at this time, no distress noted. RT will continue to monitor.

## 2020-03-27 ENCOUNTER — Inpatient Hospital Stay (HOSPITAL_COMMUNITY): Payer: Medicare HMO | Admitting: Occupational Therapy

## 2020-03-27 ENCOUNTER — Inpatient Hospital Stay (HOSPITAL_COMMUNITY): Payer: Medicare HMO

## 2020-03-27 ENCOUNTER — Inpatient Hospital Stay (HOSPITAL_COMMUNITY): Payer: Medicare HMO | Admitting: Physical Therapy

## 2020-03-27 LAB — CBC
HCT: 39.8 % (ref 39.0–52.0)
Hemoglobin: 13.6 g/dL (ref 13.0–17.0)
MCH: 34.2 pg — ABNORMAL HIGH (ref 26.0–34.0)
MCHC: 34.2 g/dL (ref 30.0–36.0)
MCV: 100 fL (ref 80.0–100.0)
Platelets: 71 10*3/uL — ABNORMAL LOW (ref 150–400)
RBC: 3.98 MIL/uL — ABNORMAL LOW (ref 4.22–5.81)
RDW: 11.3 % — ABNORMAL LOW (ref 11.5–15.5)
WBC: 5.4 10*3/uL (ref 4.0–10.5)
nRBC: 0 % (ref 0.0–0.2)

## 2020-03-27 LAB — BASIC METABOLIC PANEL
Anion gap: 7 (ref 5–15)
BUN: 15 mg/dL (ref 8–23)
CO2: 25 mmol/L (ref 22–32)
Calcium: 8.4 mg/dL — ABNORMAL LOW (ref 8.9–10.3)
Chloride: 105 mmol/L (ref 98–111)
Creatinine, Ser: 1.28 mg/dL — ABNORMAL HIGH (ref 0.61–1.24)
GFR calc Af Amer: >60 mL/min (ref >60)
GFR calc non Af Amer: 57 mL/min — ABNORMAL LOW (ref >60)
Glucose, Bld: 122 mg/dL — ABNORMAL HIGH (ref 70–99)
Potassium: 3.8 mmol/L (ref 3.5–5.1)
Sodium: 137 mmol/L (ref 135–145)

## 2020-03-27 NOTE — Progress Notes (Signed)
[facility] PHYSICAL MEDICINE & REHABILITATION PROGRESS NOTE  Subjective/Complaints: Kevin [name] seen laying in bed this morning.  He states he slept well overnight.  He states he had a good weekend.  He is eager for discharge tomorrow.  ROS: Denies CP, SOB, N/V/D  Objective: Vital Signs: Blood pressure 126/76, pulse 83, temperature 98.1 F (36.7 C), resp. rate 16, height 6' (1.829 m), weight 100.9 kg, SpO2 96 %. No results found. Recent Labs    03/27/20 0733  WBC 5.4  HGB 13.6  HCT 39.8  PLT 71*   Recent Labs    03/27/20 0733  NA 137  K 3.8  CL 105  CO2 25  GLUCOSE 122*  BUN 15  CREATININE 1.28*  CALCIUM 8.4*    Physical Exam: BP 126/76 (BP Location: Right Arm)   Pulse 83   Temp 98.1 F (36.7 C)   Resp 16   Ht 6' (1.829 m)   Wt 100.9 kg   SpO2 96%   BMI 30.17 kg/m   Constitutional: No distress . Vital signs reviewed. HENT: Normocephalic.  Atraumatic. Eyes: EOMI. No discharge. Cardiovascular: No JVD. Respiratory: Normal effort.  No stridor. GI: Non-distended. Skin: Warm and dry.  Intact. Psych: Normal mood.  Normal behavior. Musc: No edema in extremities.  No tenderness in extremities. Neuro: Alert HOH Right facial weakness, unchanged Dysarthria, stable Motor:  RUE: 4+/5 proximal to distal, improving RLE: 4+/5 proximal to distal with apraxia, improving.  Assessment/Plan: 1. Functional deficits secondary to left MCA/ACA infarcts which require 3+ hours per day of interdisciplinary therapy in a comprehensive inpatient rehab setting.  Physiatrist is providing close team supervision and 24 hour management of active medical problems listed below.  Physiatrist and rehab team continue to assess barriers to discharge/monitor Kevin [name] progress toward functional and medical goals  Care Tool:  Bathing    Body parts bathed by Kevin [name]: Right arm, Left arm, Chest, Abdomen, Front perineal area, Left upper leg, Face, Right upper leg, Buttocks, Left lower leg, Right  lower leg   Body parts bathed by helper: Buttocks Body parts n/a: Right lower leg, Left lower leg   Bathing assist Assist Level: Supervision/Verbal cueing     Upper Body Dressing/Undressing Upper body dressing Upper body dressing/undressing activity did not occur (including orthotics): N/A What is the Kevin [name] wearing?: Pull over shirt    Upper body assist Assist Level: Independent with assistive device    Lower Body Dressing/Undressing Lower body dressing    Lower body dressing activity did not occur: N/A What is the Kevin [name] wearing?: Underwear/pull up, Pants     Lower body assist Assist for lower body dressing: Independent with assitive device     Toileting Toileting Toileting Activity did not occur (Clothing management and hygiene only): N/A (no void or bm)  Toileting assist Assist for toileting: Contact Guard/Touching assist     Transfers Chair/bed transfer  Transfers assist     Chair/bed transfer assist level: Supervision/Verbal cueing     Locomotion Ambulation   Ambulation assist      Assist level: Supervision/Verbal cueing Assistive device: Walker-rolling Max distance: 256ft   Walk 10 feet activity   Assist  Walk 10 feet activity did not occur: Safety/medical concerns (fatigue)  Assist level: Supervision/Verbal cueing Assistive device: Walker-rolling   Walk 50 feet activity   Assist Walk 50 feet with 2 turns activity did not occur: Safety/medical concerns (fatigue)  Assist level: Supervision/Verbal cueing Assistive device: Walker-rolling    Walk 150 feet activity   Assist Walk 150 feet  activity did not occur: Safety/medical concerns (fatigue)  Assist level: Supervision/Verbal cueing Assistive device: Walker-rolling    Walk 10 feet on uneven surface  activity   Assist Walk 10 feet on uneven surfaces activity did not occur: Safety/medical concerns (fatigue)   Assist level: Minimal Assistance - Kevin [name] > 75% Assistive device:  Photographer Will Kevin [name] use wheelchair at discharge?: No Type of Wheelchair: Manual    Wheelchair assist level: Supervision/Verbal cueing Max wheelchair distance: 139ft    Wheelchair 50 feet with 2 turns activity    Assist        Assist Level: Supervision/Verbal cueing   Wheelchair 150 feet activity     Assist     Assist Level: Supervision/Verbal cueing      Medical Problem List and Plan: 1. Right sided hemiparesis with poor safety awareness and decrease in problem solving affecting mobility and ADLs secondary to Left ACA/MCA watershed infarct, small left basal ganglia and thalamic infarct, left parietal occipital infarct on 03/02/20.  Continue CIR 2. Antithrombotics: -DVT/anticoagulation:Pharmaceutical:Other (comment)--Eliquis. -antiplatelet therapy: N/A 3. Pain Management:N/A 4. Mood:LCSW to follow for evaluation and support. -antipsychotic agents: N/a 5. Neuropsych: This patientisnot fully capable of making decisions on hisown behalf. 6. Skin/Wound Care:Routine pressure relief measures. 7. Fluids/Electrolytes/Nutrition:Monitor I/Os 8. HTN: Monitor BP   Lasix/Entresto/aldactone resumed 06/14.   Coreg decreased on 6/25   Vitals:   03/27/20 0827 03/27/20 1502  BP:  126/76  Pulse: 86 83  Resp: 18 16  Temp:  98.1 F (36.7 C)  SpO2: 96% 96%   Controlled on 7/5  Monitor with increased mobility 9. Chronic systolic CHF: Monitor Monitor for signs of overload. Continue Coreg bid, Lasix, Entresto,aldactone and Lipitor.   No ASA due to low platelets.    Filed Weights   03/24/20 0431 03/25/20 0444 03/26/20 0513  Weight: 101.5 kg 101 kg 100.9 kg   Controlled on 7/5 10. Cirrhosis of the liver with chronic thrombocytopenia: Monitor for signs of bleeding or decompensation.   LFTs WNL on 6/15  Plts 71 on 7/5 11. Chronic SOB: Multifactorial--? OSA. On Trilegy for reactive airway disease. Duo  nebs prn. Encourage IS.   Monitor with increased exertion 12. Polysubstance abuse + Cocaine and THC on admit : Has been ongoing since 2015. Educate/encourage Kevin [name] regarding life style changes.  13. Seizure disorder: Continue Keppra bid.   No breakthrough seizures since admission to rehab-7/5 14. Impaired fasting glucose/Hyperglycemia: Hgb A1c- 5.2.  Marland Kitchencbg  Monitor with increased mobility 15. CKD stage III:   Cr.  1.28 on 7/5  Encourage fluids  Cont to monitor 16. Constipation  Bowel meds increased on 6/15, again on 6/16, decreased on 6/18, decreased again on 6/19  Improving 17.  Macrocytosis  Vitamin B12/folate within normal limits 18. Urinary retention/neurogenic bladder  Flomax increased on 6/23  Improved 19.  Atrial fibrillation  Rate controlled, noted on recent ECG as well  12.  Acute blood loss anemia-resolved  Vitamin B12/folate within normal limits  Hemoglobin 13.6 on 7/5  Continue to monitor  LOS: 21 days A FACE TO FACE EVALUATION WAS PERFORMED  [facility] [name] 03/27/2020, 5:10 PM

## 2020-03-27 NOTE — Progress Notes (Signed)
Physical Therapy Session Note  Patient Details  Name: Kevin [name] MRN: [id number] Date of Birth: 01-30-1951  Today's Date: 03/27/2020 PT Individual Time: 0900-0957 PT Individual Time Calculation (min): 57 min   Short Term Goals: Week 3:  PT Short Term Goal 1 (Week 3): STG = LTG due to remaining LOS  Skilled Therapeutic Interventions/Progress Updates:    Patient received in bed, sleeping but easily woken and a bit persnickity this morning until he was reoriented and learned that therapy today was indeed scheduled. Able to complete all bed mobility, functional transfers, gait approximately 281f with RW, and WC mobility approximately 1567fwith S and Min cues for safety and gait technique mostly when distracted today. Tolerated riding Nustep for 6 minutes on resistance 6 with BLEs only and cues to achieve full knee extension while on machine. Otherwise continued practicing navigation of ramp with RW as well as gait over uneven surfaces with RW for which he did require MinA due to difficulty in safely managing assistive device over more challenging surface. [facility], chair alarm active this morning and looking forward to going home tomorrow.   Therapy Documentation Precautions:  Precautions Precautions: Fall Precaution Comments: fatigues VERY easily, need to watch O2 sats, R hemiplegia and neglect, impaired cognition Restrictions Weight Bearing Restrictions: No Pain: Pain Assessment Pain Scale: 0-10 Pain Score: 0-No pain    Therapy/Group: Individual Therapy   KrWindell NorfolkDPT, PN1   Supplemental Physical Therapist [place]  Pager [phone] [phone]  03/27/2020, 12:17 PM

## 2020-03-27 NOTE — Progress Notes (Signed)
Occupational Therapy Discharge Summary  Patient Details  Name: Kevin [name] MRN: [id number] Date of Birth: Jan 31, 1951  Today's Date: 03/27/2020 OT Individual Time: 1287-8676 OT Individual Time Calculation (min): 59 min    Patient has met 10 of 11 long term goals due to improved activity tolerance, improved balance, postural control, ability to compensate for deficits, improved attention, improved awareness and improved coordination.  Patient to discharge at overall Supervision level.  Patient's care partner is independent to provide the necessary physical and cognitive assistance at discharge.    Reasons goals not met: mild impairment in dynamic standing balance requires intermittent CGA during toileting.  Recommendation:  Patient will benefit from ongoing skilled OT services in home health setting to continue to advance functional skills in the area of BADL.  Equipment: RW and hospital bed, AFO RLE  Reasons for discharge: treatment goals met and discharge from hospital  Skilled OT Intervention:  Pt sitting up in w/c agreeable to OT session.  Pt requesting to bath at sinkside.  Pt completed all UB dressing and bathing and oral hygiene with modified independence sitting at sink.  Pt then requesting to use bathroom.  Pt self propelled w/c to toilet with mod I.  Pt completed SPT w/c to toilet with supervision.  Pt completed pericare and clothing mgt with CGA.  Pt returned to side of bed position self propelling with mod I.  Educated pt regarding HEP RUE strengthening using medium resistive band.  Pt return demonstrated with good carryover.  Reviewed OT dc recommendations with pt including follow up with home health OT.  Pt in agreement with no questions.  Call bell in reach, seat alarm on.  Patient/family agrees with progress made and goals achieved: Yes  OT Discharge Precautions/Restrictions  Precautions Precautions: Fall Precaution Comments: fatigues VERY easily, need to watch O2  sats, R hemiplegia and neglect, impaired cognition Restrictions Weight Bearing Restrictions: No Pain Pain Assessment Pain Scale: 0-10 Pain Score: 0-No pain ADL ADL Eating: Modified independent Where Assessed-Eating: Wheelchair Grooming: Modified independent Where Assessed-Grooming: Sitting at sink Upper Body Bathing: Supervision/safety Where Assessed-Upper Body Bathing: Shower Lower Body Bathing: Supervision/safety Where Assessed-Lower Body Bathing: Shower Upper Body Dressing: Modified independent (Device) Where Assessed-Upper Body Dressing: Sitting at sink Lower Body Dressing: Supervision/safety Where Assessed-Lower Body Dressing: Sitting at sink Toileting: Contact guard Where Assessed-Toileting: Glass blower/designer: Close supervision Toilet Transfer Method: Counselling psychologist: Energy manager: Close supervison Clinical cytogeneticist Method: Optometrist: Radio broadcast assistant, Energy manager: Close supervision Social research officer, government Method: Heritage manager: Gaffer Baseline Vision/History: Wears glasses Wears Glasses: Reading only Patient Visual Report: No change from baseline Vision Assessment?: Yes Eye Alignment: Within Functional Limits Tracking/Visual Pursuits: Able to track stimulus in all quads without difficulty Saccades: Decreased speed of saccadic movement Perception  Perception: Within Functional Limits Inattention/Neglect: Does not attend to right side of body Praxis Praxis: Impaired Praxis Impairment Details: Motor planning Cognition Overall Cognitive Status: Impaired/Different from baseline Arousal/Alertness: Awake/alert Orientation Level: Oriented X4 Attention: Selective;Sustained;Divided Focused Attention: Appears intact Focused Attention Impairment: Verbal basic;Functional basic Sustained Attention: Appears intact Sustained Attention  Impairment: Verbal basic;Functional basic Selective Attention: Impaired Selective Attention Impairment: Verbal basic;Functional basic Divided Attention: Impaired Divided Attention Impairment: Verbal basic;Functional basic Memory: Impaired Memory Impairment: Storage deficit;Retrieval deficit;Decreased short term memory Decreased Short Term Memory: Verbal basic;Functional basic Problem Solving: Impaired Problem Solving Impairment: Verbal basic;Functional basic Executive Function: Museum/gallery conservator: Impaired Decision Making: Impaired Initiating: Appears intact  Safety/Judgment: Impaired Comments: ongoing decreased awareness of physical and cognitive impairments Sensation Sensation Light Touch: Appears Intact Hot/Cold: Not tested Proprioception: Impaired by gross assessment Stereognosis: Not tested Additional Comments: ongoing reduced attention to positioning of R side/reduced attention to and proprioception of R LE Coordination Gross Motor Movements are Fluid and Coordinated: No Fine Motor Movements are Fluid and Coordinated: No Coordination and Movement Description: limited by gross weakness and hip stiffness although hemiplegia is improving Motor  Motor Motor: Hemiplegia;Motor apraxia Motor - Discharge Observations: R hemiplegia but improved since eval, mild apraxia, flexed posture Mobility  Bed Mobility Bed Mobility: Rolling Right;Rolling Left;Supine to Sit;Sit to Supine Rolling Right: Supervision/verbal cueing Rolling Left: Supervision/Verbal cueing Supine to Sit: Supervision/Verbal cueing Sit to Supine: Supervision/Verbal cueing Transfers Sit to Stand: Supervision/Verbal cueing Stand to Sit: Supervision/Verbal cueing  Trunk/Postural Assessment  Cervical Assessment Cervical Assessment: Within Functional Limits Thoracic Assessment Thoracic Assessment: Within Functional Limits Lumbar Assessment Lumbar Assessment: Within Functional Limits Postural  Control Postural Control: Deficits on evaluation  Balance Balance Balance Assessed: Yes Static Sitting Balance Static Sitting - Balance Support: No upper extremity supported;Feet supported Static Sitting - Level of Assistance: 6: Modified independent (Device/Increase time) Dynamic Sitting Balance Dynamic Sitting - Balance Support: Feet supported;No upper extremity supported Dynamic Sitting - Level of Assistance: 5: Stand by assistance Static Standing Balance Static Standing - Balance Support: Bilateral upper extremity supported;During functional activity Static Standing - Level of Assistance: 5: Stand by assistance Dynamic Standing Balance Dynamic Standing - Balance Support: Bilateral upper extremity supported;During functional activity Dynamic Standing - Level of Assistance: 5: Stand by assistance Extremity/Trunk Assessment RUE Assessment RUE Assessment: Exceptions to [facility] General Strength Comments: RUE: shoulder 4-/5; biceps 4/5, triceps 4-/5 , forearm pronation and supination: 4/5 , wrist flex/ext 4/5, gross hand flex and ext 4/5 RUE Body System: Neuro Brunstrum levels for arm and hand: Arm Brunstrum level for arm: Stage V Relative Independence from Synergy Brunstrum level for hand: Stage VI Isolated joint movements LUE Assessment LUE Assessment: Within Functional Limits   Cattie Tineo L Shellie Goettl 03/27/2020, 3:03 PM

## 2020-03-27 NOTE — Progress Notes (Signed)
RT offered pt CPAP dream station for the night and pt declined stating he did not need it. RT will continue to monitor.

## 2020-03-27 NOTE — Progress Notes (Signed)
Physical Therapy Discharge Summary  Patient Details  Name: Kevin [name] MRN: [id number] Date of Birth: 19-Mar-1951  Today's Date: 03/27/2020   Patient has met 9 of 11 long term goals due to improved activity tolerance, improved balance, improved postural control, increased strength, ability to compensate for deficits, functional use of  right upper extremity and right lower extremity, improved awareness and improved coordination.  Patient to discharge at an ambulatory level generally supervision but with MinA for steps .   Patient's care partner is independent to provide the necessary physical and cognitive assistance at discharge.  Reasons goals not met: Did not meet car transfers due to need for MinA to get RLE into the car, also needs MinA to manage steps with RW for safety as they do not have railings available at home.   Recommendation:  Patient will benefit from ongoing skilled PT services in outpatient setting to continue to advance safe functional mobility, address ongoing impairments in balance, gait and stair navigation, strength, safety, and minimize fall risk.  Equipment: RW, AFO, hospital bed   Reasons for discharge: treatment goals met and discharge from hospital  Patient/family agrees with progress made and goals achieved: Yes  PT Discharge Precautions/Restrictions Precautions Precautions: Fall Precaution Comments: fatigues VERY easily, need to watch O2 sats, R hemiplegia and neglect, impaired cognition Restrictions Weight Bearing Restrictions: No Vital Signs Therapy Vitals Temp: 98.1 F (36.7 C) Pulse Rate: 83 Resp: 16 BP: 126/76 Oxygen Therapy SpO2: 96 % O2 Device: Room Air Pain Pain Assessment Pain Scale: 0-10 Pain Score: 0-No pain Vision/Perception  Vision - Assessment Eye Alignment: Within Functional Limits Tracking/Visual Pursuits: Able to track stimulus in all quads without difficulty Saccades: Decreased speed of saccadic  movement Perception Perception: Within Functional Limits Inattention/Neglect: Does not attend to right side of body Praxis Praxis: Impaired Praxis Impairment Details: Motor planning  Cognition Overall Cognitive Status: Impaired/Different from baseline Arousal/Alertness: Awake/alert Orientation Level: Oriented X4 Attention: Selective Focused Attention: Appears intact Focused Attention Impairment: Verbal basic;Functional basic Sustained Attention: Appears intact Sustained Attention Impairment: Verbal basic;Functional basic Selective Attention: Impaired Selective Attention Impairment: Verbal basic;Functional basic Divided Attention: Impaired Divided Attention Impairment: Verbal basic;Functional basic Memory: Impaired Memory Impairment: Storage deficit;Retrieval deficit;Decreased short term memory Decreased Short Term Memory: Verbal basic;Functional basic Awareness: Impaired Awareness Impairment: Emergent impairment Problem Solving: Impaired Problem Solving Impairment: Verbal basic;Functional basic Executive Function: Museum/gallery conservator: Impaired Decision Making: Impaired Initiating: Appears intact Safety/Judgment: Impaired Comments: ongoing decreased awareness of physical and cognitive impairments Sensation Sensation Light Touch: Appears Intact Hot/Cold: Not tested Proprioception: Impaired by gross assessment Stereognosis: Not tested Additional Comments: ongoing reduced attention to positioning of R side/reduced attention to and proprioception of R LE Coordination Gross Motor Movements are Fluid and Coordinated: No Fine Motor Movements are Fluid and Coordinated: No Coordination and Movement Description: limited by gross weakness and hip stiffness although hemiplegia is improving Motor  Motor Motor: Hemiplegia;Motor apraxia Motor - Discharge Observations: R hemiplegia but improved since eval, mild apraxia, flexed posture  Mobility Bed Mobility Bed  Mobility: Rolling Right;Rolling Left;Supine to Sit;Sit to Supine Rolling Right: Supervision/verbal cueing Rolling Left: Supervision/Verbal cueing Supine to Sit: Supervision/Verbal cueing Sit to Supine: Supervision/Verbal cueing Transfers Transfers: Sit to Stand;Stand to Sit;Stand Pivot Transfers Sit to Stand: Supervision/Verbal cueing Stand to Sit: Supervision/Verbal cueing Stand Pivot Transfers: Supervision/Verbal cueing Stand Pivot Transfer Details: Verbal cues for technique;Verbal cues for precautions/safety;Verbal cues for safe use of DME/AE Transfer (Assistive device): Rolling walker Locomotion  Gait Ambulation: Yes Gait Assistance: Supervision/Verbal cueing Gait  Distance (Feet): 220 Feet Assistive device: Rolling walker Gait Assistance Details: Verbal cues for precautions/safety;Verbal cues for technique;Verbal cues for safe use of DME/AE Gait Gait: Yes Gait Pattern: Impaired Gait Pattern: Step-through pattern;Decreased dorsiflexion - right;Decreased weight shift to right;Right flexed knee in stance;Left flexed knee in stance;Trendelenburg;Decreased trunk rotation;Trunk flexed;Wide base of support Gait velocity: decreased Stairs / Additional Locomotion Stairs: Yes Stairs Assistance: Minimal Assistance - Patient > 75% Stair Management Technique: With walker Number of Stairs: 4 Height of Stairs: 4 Ramp: Contact Guard/touching assist Curb: Minimal Assistance - Patient >75% Wheelchair Mobility Wheelchair Mobility: Yes Wheelchair Assistance: Chartered loss adjuster: Both lower extermities Wheelchair Parts Management: Needs assistance Distance: 122f  Trunk/Postural Assessment  Cervical Assessment Cervical Assessment: Within Functional Limits Thoracic Assessment Thoracic Assessment: Within Functional Limits Lumbar Assessment Lumbar Assessment: Within Functional Limits Postural Control Postural Control: Deficits on evaluation   Balance Balance Balance Assessed: Yes Static Sitting Balance Static Sitting - Balance Support: No upper extremity supported;Feet supported Static Sitting - Level of Assistance: 6: Modified independent (Device/Increase time) Dynamic Sitting Balance Dynamic Sitting - Balance Support: Feet supported;No upper extremity supported Dynamic Sitting - Level of Assistance: 5: Stand by assistance Static Standing Balance Static Standing - Balance Support: Bilateral upper extremity supported;During functional activity Static Standing - Level of Assistance: 5: Stand by assistance Dynamic Standing Balance Dynamic Standing - Balance Support: Bilateral upper extremity supported;During functional activity Dynamic Standing - Level of Assistance: 5: Stand by assistance Extremity Assessment  RUE Assessment RUE Assessment: Not tested General Strength Comments: RUE: shoulder 4-/5; biceps 4/5, triceps 4-/5 , forearm pronation and supination: 4/5 , wrist flex/ext 4/5, gross hand flex and ext 4/5 RUE Body System: Neuro Brunstrum levels for arm and hand: Arm Brunstrum level for arm: Stage V Relative Independence from Synergy Brunstrum level for hand: Stage VI Isolated joint movements LUE Assessment LUE Assessment: Not tested RLE Assessment RLE Assessment: Exceptions to [facility] LLCActive Range of Motion (AROM) Comments: WFL, tight hip flexors General Strength Comments: ankle 2/5, quad 3+/5, hip flexor 3/5 LLE Assessment LLE Assessment: Exceptions to [facility] LLCActive Range of Motion (AROM) Comments: tight hip flexors, otherwise WNL General Strength Comments: anlke DF 4+/5, quad 4/5, hip flexor 3/5    KWindell Norfolk DPT, PN1   Supplemental Physical Therapist CNash   Pager [phone] Rehab Office [phone]

## 2020-03-28 MED ORDER — SPIRONOLACTONE 25 MG PO TABS
12.5000 mg | ORAL_TABLET | Freq: Every day | ORAL | 0 refills | Status: DC
Start: 2020-03-28 — End: 2020-12-21

## 2020-03-28 MED ORDER — BETHANECHOL CHLORIDE 25 MG PO TABS: 25.0000 mg | ORAL_TABLET | Freq: Four times a day (QID) | ORAL | 0 refills | Status: DC

## 2020-03-28 MED ORDER — TAMSULOSIN HCL 0.4 MG PO CAPS: 0.8000 mg | ORAL_CAPSULE | Freq: Every day | ORAL | 0 refills | Status: DC

## 2020-03-28 MED ORDER — ENTRESTO 97-103 MG PO TABS: 1.0000 | ORAL_TABLET | Freq: Two times a day (BID) | ORAL | 0 refills | Status: DC

## 2020-03-28 MED ORDER — FUROSEMIDE 20 MG PO TABS: 20.0000 mg | ORAL_TABLET | Freq: Every day | ORAL | 0 refills | Status: DC

## 2020-03-28 MED ORDER — LEVETIRACETAM 1000 MG PO TABS: 1000.0000 mg | ORAL_TABLET | Freq: Two times a day (BID) | ORAL | 0 refills | Status: DC

## 2020-03-28 MED ORDER — CARVEDILOL 12.5 MG PO TABS: 12.5000 mg | ORAL_TABLET | Freq: Two times a day (BID) | ORAL | 0 refills | Status: DC

## 2020-03-28 MED ORDER — APIXABAN 5 MG PO TABS: 5.0000 mg | ORAL_TABLET | Freq: Two times a day (BID) | ORAL | 0 refills | Status: DC

## 2020-03-28 MED ORDER — ATORVASTATIN CALCIUM 40 MG PO TABS: 40.0000 mg | ORAL_TABLET | Freq: Every day | ORAL | 0 refills | Status: DC

## 2020-03-28 NOTE — Discharge Summary (Addendum)
Physician Discharge Summary  Patient ID: Kevin [name] MRN: [id number] DOB/AGE: 1951/06/28 69 y.o.  Admit date: 03/06/2020 Discharge date: 03/28/2020  Discharge Diagnoses:  Principal Problem:   Cerebrovascular accident (CVA) due to embolism of cerebral artery (HCC) Active Problems:   Polysubstance abuse (HCC)   Slow transit constipation   Chronic systolic congestive heart failure (HCC)   Benign essential HTN   Hyperglycemia   Macrocytosis   Right hemiparesis (HCC)   Discharged Condition: stable   Significant Diagnostic Studies:   Labs:  Basic Metabolic Panel: BMP Latest Ref Rng & Units 03/27/2020 03/20/2020 03/13/2020  Glucose 70 - 99 mg/dL 546(E) 703(J) 009(F)  BUN 8 - 23 mg/dL 15 17 21   Creatinine 0.61 - 1.24 mg/dL ) 8.18(E) 9.93(Z)  Sodium 135 - 145 mmol/L 137 138 140  Potassium 3.5 - 5.1 mmol/L 3.8 3.8 3.9  Chloride 98 - 111 mmol/L 105 107 109  CO2 22 - 32 mmol/L 25 24 22   Calcium 8.9 - 10.3 mg/dL 1.69(C) 8.3(L) 8.6(L)    CBC: CBC Latest Ref Rng & Units 03/27/2020 03/24/2020 03/22/2020  WBC 4.0 - 10.5 K/uL 5.4 7.4 8.6  Hemoglobin 13.0 - 17.0 g/dL 05/25/2020 12.6(L) 12.9(L)  Hematocrit 39 - 52 % 39.8 37.6(L) 38.5(L)  Platelets 150 - 400 K/uL 71(L) 58(L) 55(L)    CBG: No results for input(s): GLUCAP in the last 168 hours.  Brief HPI:   Kevin [name] is a 69 y.o. LH-male with history of HTN, CAD with chronic systolic CHF, CKD, seizures, cirrhosis of the liver, chronic thrombocytopenia, polysubstance abuse, CAF-on Eliquis, prior TIA/CVA who was admitted on 03/02/20 after a fall off the bed with slurred speech and found to have right sided weakness. CTA head negative for LVO or perfusion deficits.  He was loaded with Keppra due to concerns of Todd's paralysis. MRI brain done revealing numerous acute cortical/subcortical embolic infarcts in L-ACA, MCA, PCA and watershed territories as well as multiple acute infarcts in left basal ganglia and left thalamus. UDS positive for  cocaine and THC. Dr. 73 recommended continuing Eliquis as well as risk factor modification. He continued to be limited by right sided weakness with decreased in problem solving and poor safety awareness affecting ADLs and mobility. CIR recommended due to functional decline.    Hospital Course: Kevin [name] was admitted to rehab 03/06/2020 for inpatient therapies to consist of PT, ST and OT at least three hours five days a week. Past admission physiatrist, therapy team and rehab RN have worked together to provide customized collaborative inpatient rehab. His blood pressures were monitored on TID basis and has been relatively controlled. Coreg was decreased to avoid hypotension. Chronic systolic CHF has been monitored with daily weights and for signs of overload. Weights have been variable but trended down and CHF has been compensated. Respiratory status is stable and he has been compliant with CPAP use.   Serial CBC showed that ABLA has resolved and acute on chronic thrombocytopenia is resolving. Macrocytosis noted but vitamin B and folate levels are WNL.   LFTs are WNL. Serial check of lytes showed that acute on chronic renal failure is improving with SCr is back to baseline. He has been seizure free during his stay. He has made good gains during his rehab stay but continues to have limitations due to right hemiplegia with neglect, apraxia and cognitive deficits. Wife has been educated on need for supervision for safety and assistance with medications. He will continue to receive follow up outpatient therapy after  discharge.     Rehab course: During patient's stay in rehab weekly team conferences were held to monitor patient's progress, set goals and discuss barriers to discharge. At admission, patient required mod to max assist with mobility and with basic self care tasks. He exhibited severe cognitive deficits with poor frustration tolerance, needed max cues for safety awareness and needed cues to  follow one step commands. He  has had improvement in activity tolerance, balance, postural control as well as ability to compensate for deficits. He has had improvement in functional use RUE  and RLE as well as improvement in awareness. He requires supervision with ADL tasks. He requires supervision with cues for transfers and to ambulate 220' with RW.  He requires min assist with basic and mildly complex tasks and for awareness of deficits. He requires supervision to utilize compensatory strategies for STM deficits, carry over of day to day information and for selective attention.  He requires min assist to climb 4 stairs. Family education was completed regarding all aspects of care, cognition and safety.   Disposition:  Home  Diet: Heart healthy.   Special Instructions: 1. No driving or strenuous activity till cleared by MD.    Allergies as of 03/28/2020   No Known Allergies      Medication List     STOP taking these medications    buPROPion 150 MG 12 hr tablet Commonly known as: WELLBUTRIN SR       TAKE these medications    albuterol 108 (90 Base) MCG/ACT inhaler Commonly known as: VENTOLIN HFA Inhale 2 puffs into the lungs every 6 (six) hours as needed for wheezing or shortness of breath.   apixaban 5 MG Tabs tablet Commonly known as: Eliquis Take 1 tablet (5 mg total) by mouth 2 (two) times daily.   atorvastatin 40 MG tablet Commonly known as: LIPITOR Take 1 tablet (40 mg total) by mouth daily at 6 PM.   bethanechol 25 MG tablet Commonly known as: URECHOLINE Take 1 tablet (25 mg total) by mouth 4 (four) times daily.   carvedilol 12.5 MG tablet Commonly known as: COREG Take 1 tablet (12.5 mg total) by mouth 2 (two) times daily with a meal. What changed:  medication strength See the new instructions.   Entresto 97-103 MG Generic drug: sacubitril-valsartan Take 1 tablet by mouth 2 (two) times daily.   folic acid 1 MG tablet Commonly known as: FOLVITE Take 1  tablet (1 mg total) by mouth daily. Need appointment before anymore refills   furosemide 20 MG tablet Commonly known as: LASIX Take 1 tablet (20 mg total) by mouth daily.   levETIRAcetam 1000 MG tablet Commonly known as: KEPPRA Take 1 tablet (1,000 mg total) by mouth 2 (two) times daily.   multivitamin with minerals Tabs tablet Take 1 tablet by mouth daily. Men's One a Day   spironolactone 25 MG tablet Commonly known as: ALDACTONE Take 0.5 tablets (12.5 mg total) by mouth daily.   tamsulosin 0.4 MG Caps capsule Commonly known as: FLOMAX Take 2 capsules (0.8 mg total) by mouth daily after supper.   Trelegy Ellipta 100-62.5-25 MCG/INH Aepb Generic drug: Fluticasone-Umeclidin-Vilant Take 1 puff by mouth daily.        Follow-up Information     Marcello Fennel, MD Follow up.   Specialty: Physical Medicine and Rehabilitation Why: Office will call you with follow up appointment Contact information: [address] [place] [address] [phone]  [facility] Follow up.   Why: for post stroke follow up Contact information: [address]     [address] [place] [address] [phone]        Norm Salt, [place]. Call on 03/29/2020.   Specialty: Physician Assistant Why: for post hospital follow up and referral to GI for work up of cirrhosis of the liver Contact information: [address] [place] [address] [phone]                 Signed: Jacquelynn Cree 03/30/2020, 10:34 AM Patient was seen, face-face, and physical exam performed by me on day of discharge, greater than 30 minutes of total time spent.. Please see progress note from day of discharge as well.  Maryla Morrow, MD, ABPMR

## 2020-03-28 NOTE — Plan of Care (Signed)
  Problem: Consults Goal: RH STROKE PATIENT EDUCATION Description: See Patient Education module for education specifics  Outcome: Completed/Met   Problem: RH BOWEL ELIMINATION Goal: RH STG MANAGE BOWEL WITH ASSISTANCE Description: STG Manage Bowel with min Assistance. Outcome: Completed/Met   Problem: RH BLADDER ELIMINATION Goal: RH STG MANAGE BLADDER WITH ASSISTANCE Description: STG Manage Bladder With min Assistance Outcome: Completed/Met   Problem: RH SAFETY Goal: RH STG ADHERE TO SAFETY PRECAUTIONS W/ASSISTANCE/DEVICE Description: STG Adhere to Safety Precautions With cues and reminders. Outcome: Completed/Met   Problem: RH COGNITION-NURSING Goal: RH STG USES MEMORY AIDS/STRATEGIES W/ASSIST TO PROBLEM SOLVE Description: STG Uses Memory Aids/Strategies With supervision Assistance to Problem Solve. Outcome: Completed/Met   Problem: RH PAIN MANAGEMENT Goal: RH STG PAIN MANAGED AT OR BELOW PT'S PAIN GOAL Description: Pain level less than 4 on scale of 0-10 Outcome: Completed/Met   Problem: RH KNOWLEDGE DEFICIT Goal: RH STG INCREASE KNOWLEDGE OF HYPERTENSION Description: Pt will be able to adhere to medication regimen, dietary restriction and lifestyle modifications to better control hypertension and further complications with min assist from family upon discharge.  Outcome: Completed/Met Goal: RH STG INCREASE KNOWLEGDE OF HYPERLIPIDEMIA Description: Pt will be able to adhere to medication regimen, dietary restriction and lifestyle modifications to prevent high cholesterol and further complications with min assist from family upon discharge.  Outcome: Completed/Met Goal: RH STG INCREASE KNOWLEDGE OF STROKE PROPHYLAXIS Description: Pt will be able to adhere to medication regimen, dietary restriction and lifestyle modifications to prevent stroke and further complications with min assist from family upon discharge.  Outcome: Completed/Met

## 2020-03-28 NOTE — Progress Notes (Signed)
Patient discharged to home with wife at 53. All belongings and equipment sent home at time of discharge. Patient stable at time of discharge.

## 2020-03-28 NOTE — Progress Notes (Signed)
Inpatient Rehabilitation Care Coordinator  Discharge Note  The overall goal for the admission was met for:   Discharge location: Yes, home  Length of Stay: Yes, 22 Days  Discharge activity level: Yes  Home/community participation: Yes  Services provided included: MD, RD, PT, OT, SLP, RN, CM, TR, Pharmacy, [place]: [employer]   Follow-up services arranged: Outpatient: Neuro Rehabilitation OP  Comments (or additional information): PT OT ST  Patient/Family verbalized understanding of follow-up arrangements: Yes  Individual responsible for coordination of the follow-up plan: estelle, [phone]  Confirmed correct DME delivered: Dyanne Iha 03/28/2020    Dyanne Iha

## 2020-03-28 NOTE — Discharge Instructions (Signed)
Inpatient Rehab Discharge Instructions  Kevin [name] Discharge date and time: 03/28/20    Activities/Precautions/ Functional Status: Activity: no lifting, driving, or strenuous exercise till cleared by MD. Diet: cardiac diet Wound Care: none needed   Functional status:  ___ No restrictions     ___ Walk up steps independently _X__ 24/7 supervision/assistance   ___ Walk up steps with assistance ___ Intermittent supervision/assistance  ___ Bathe/dress independently ___ Walk with walker     _X__ Bathe/dress with assistance ___ Walk Independently    ___ Shower independently ___ Walk with assistance    ___ Shower with assistance _X_ No alcohol     ___ Return to work/school ________   DISCHARGE:    Outpatient: PT     OT    ST                Agency: [facility] Phone:[phone]              Appointment Date/Time: To determine with spouse  Medical Equipment/Items Ordered: Rolling Walker, Hospital Bed                                                 Agency/Supplier: [employer]   Special Instructions:  Per [facility] statutes, patients with seizures are not allowed to drive until  they have been seizure-free for six months. Use caution when using heavy equipment or power tools. Avoid working on ladders or at heights. Take showers instead of baths. Ensure the water temperature is not too high on the home water heater. Do not go swimming alone. When caring for infants or small children, sit down when holding, feeding, or changing them to minimize risk of injury to the child in the event you have a seizure. Also, Maintain good sleep hygiene. Avoid alcohol.    My questions have been answered and I understand these instructions. I will adhere to these goals and the provided educational materials after my discharge from the hospital.  Patient/Caregiver Signature _______________________________ Date __________  Clinician Signature  _______________________________________ Date __________  Please bring this form and your medication list with you to all your follow-up doctor's appointments. COMMUNITY REFERRALS UPON           Information on my medicine - ELIQUIS (apixaban)  This medication education was reviewed with me or my healthcare representative as part of my discharge preparation.  The pharmacist that spoke with me during my hospital stay was:  Ulyses Southward, RPH-CPP  Why was Eliquis prescribed for you? Eliquis was prescribed for you to reduce the risk of a blood clot forming that can cause a stroke if you have a medical condition called atrial fibrillation (a type of irregular heartbeat).  What do You need to know about Eliquis ? Take your Eliquis TWICE DAILY - one tablet in the morning and one tablet in the evening with or without food. If you have difficulty swallowing the tablet whole please discuss with your pharmacist how to take the medication safely.  Take Eliquis exactly as prescribed by your doctor and DO NOT stop taking Eliquis without talking to the doctor who prescribed the medication.  Stopping may increase your risk of developing a stroke.  Refill your prescription before you run out.  After discharge, you should have regular check-up appointments with your healthcare provider that is prescribing  your Eliquis.  In the future your dose may need to be changed if your kidney function or weight changes by a significant amount or as you get older.  What do you do if you miss a dose? If you miss a dose, take it as soon as you remember on the same day and resume taking twice daily.  Do not take more than one dose of ELIQUIS at the same time to make up a missed dose.  Important Safety Information A possible side effect of Eliquis is bleeding. You should call your healthcare provider right away if you experience any of the following: ? Bleeding from an injury or your nose that does not  stop. ? Unusual colored urine (red or dark brown) or unusual colored stools (red or black). ? Unusual bruising for unknown reasons. ? A serious fall or if you hit your head (even if there is no bleeding).  Some medicines may interact with Eliquis and might increase your risk of bleeding or clotting while on Eliquis. To help avoid this, consult your healthcare provider or pharmacist prior to using any new prescription or non-prescription medications, including herbals, vitamins, non-steroidal anti-inflammatory drugs (NSAIDs) and supplements.  This website has more information on Eliquis (apixaban): http://www.eliquis.com/eliquis/home

## 2020-03-28 NOTE — Progress Notes (Signed)
[facility] PHYSICAL MEDICINE & REHABILITATION PROGRESS NOTE  Subjective/Complaints: Patient seen sitting up in bed this morning.  He states he slept well overnight.  He is eager for discharge.  ROS: Denies CP, SOB, N/V/D  Objective: Vital Signs: Blood pressure 126/86, pulse (!) 57, temperature 97.8 F (36.6 C), temperature source Oral, resp. rate 18, height 6' (1.829 m), weight 99.6 kg, SpO2 98 %. No results found. Recent Labs    03/27/20 0733  WBC 5.4  HGB 13.6  HCT 39.8  PLT 71*   Recent Labs    03/27/20 0733  NA 137  K 3.8  CL 105  CO2 25  GLUCOSE 122*  BUN 15  CREATININE 1.28*  CALCIUM 8.4*    Physical Exam: BP 126/86 (BP Location: Left Arm)   Pulse (!) 57   Temp 97.8 F (36.6 C) (Oral)   Resp 18   Ht 6' (1.829 m)   Wt 99.6 kg   SpO2 98%   BMI 29.78 kg/m   Constitutional: No distress . Vital signs reviewed. HENT: Normocephalic.  Atraumatic. Eyes: EOMI. No discharge. Cardiovascular: No JVD. Respiratory: Normal effort.  No stridor. GI: Non-distended. Skin: Warm and dry.  Intact. Psych: Normal mood.  Normal behavior. Musc: No edema in extremities.  No tenderness in extremities. Neuro: Alert HOH Right facial weakness, unchanged Dysarthria, stable Motor:  RUE: 4+/5 proximal to distal, improving RLE: 4+/5 proximal to distal with apraxia, improving.  Assessment/Plan: 1. Functional deficits secondary to left MCA/ACA infarcts which require 3+ hours per day of interdisciplinary therapy in a comprehensive inpatient rehab setting.  Physiatrist is providing close team supervision and 24 hour management of active medical problems listed below.  Physiatrist and rehab team continue to assess barriers to discharge/monitor patient progress toward functional and medical goals  Care Tool:  Bathing    Body parts bathed by patient: Right arm, Left arm, Chest, Abdomen, Front perineal area, Left upper leg, Face, Right upper leg, Buttocks, Left lower leg, Right  lower leg   Body parts bathed by helper: Buttocks Body parts n/a: Right lower leg, Left lower leg   Bathing assist Assist Level: Supervision/Verbal cueing     Upper Body Dressing/Undressing Upper body dressing Upper body dressing/undressing activity did not occur (including orthotics): N/A What is the patient wearing?: Pull over shirt    Upper body assist Assist Level: Independent with assistive device    Lower Body Dressing/Undressing Lower body dressing    Lower body dressing activity did not occur: N/A What is the patient wearing?: Underwear/pull up, Pants     Lower body assist Assist for lower body dressing: Independent with assitive device     Toileting Toileting Toileting Activity did not occur (Clothing management and hygiene only): N/A (no void or bm)  Toileting assist Assist for toileting: Contact Guard/Touching assist     Transfers Chair/bed transfer  Transfers assist     Chair/bed transfer assist level: Supervision/Verbal cueing     Locomotion Ambulation   Ambulation assist      Assist level: Supervision/Verbal cueing Assistive device: Walker-rolling Max distance: 218ft   Walk 10 feet activity   Assist  Walk 10 feet activity did not occur: Safety/medical concerns (fatigue)  Assist level: Supervision/Verbal cueing Assistive device: Walker-rolling   Walk 50 feet activity   Assist Walk 50 feet with 2 turns activity did not occur: Safety/medical concerns (fatigue)  Assist level: Supervision/Verbal cueing Assistive device: Walker-rolling    Walk 150 feet activity   Assist Walk 150 feet activity did  not occur: Safety/medical concerns (fatigue)  Assist level: Supervision/Verbal cueing Assistive device: Walker-rolling    Walk 10 feet on uneven surface  activity   Assist Walk 10 feet on uneven surfaces activity did not occur: Safety/medical concerns (fatigue)   Assist level: Minimal Assistance - Patient > 75% Assistive device:  Photographer Will patient use wheelchair at discharge?: No Type of Wheelchair: Manual    Wheelchair assist level: Supervision/Verbal cueing Max wheelchair distance: 114ft    Wheelchair 50 feet with 2 turns activity    Assist        Assist Level: Supervision/Verbal cueing   Wheelchair 150 feet activity     Assist     Assist Level: Supervision/Verbal cueing      Medical Problem List and Plan: 1. Right sided hemiparesis with poor safety awareness and decrease in problem solving affecting mobility and ADLs secondary to Left ACA/MCA watershed infarct, small left basal ganglia and thalamic infarct, left parietal occipital infarct on 03/02/20.  DC today  Will see patient for transitional care management in 1-2 weeks post-discharge 2. Antithrombotics: -DVT/anticoagulation:Pharmaceutical:Other (comment)--Eliquis. -antiplatelet therapy: N/A 3. Pain Management:N/A 4. Mood:LCSW to follow for evaluation and support. -antipsychotic agents: N/a 5. Neuropsych: This patientisnot fully capable of making decisions on hisown behalf. 6. Skin/Wound Care:Routine pressure relief measures. 7. Fluids/Electrolytes/Nutrition:Monitor I/Os 8. HTN: Monitor BP   Lasix/Entresto/aldactone resumed 06/14.   Coreg decreased on 6/25   Vitals:   03/28/20 0832 03/28/20 0833  BP:    Pulse:  (!) 57  Resp:  18  Temp:    SpO2: 98% 98%   Controlled on 7/6  Monitor with increased mobility 9. Chronic systolic CHF: Monitor Monitor for signs of overload. Continue Coreg bid, Lasix, Entresto,aldactone and Lipitor.   No ASA due to low platelets.    Filed Weights   03/25/20 0444 03/26/20 0513 03/28/20 0500  Weight: 101 kg 100.9 kg 99.6 kg   Stable on 7/6 10. Cirrhosis of the liver with chronic thrombocytopenia: Monitor for signs of bleeding or decompensation.   LFTs WNL on 6/15  Plts 71 on 7/5, continue to follow-up with  outpatient 11. Chronic SOB: Multifactorial--? OSA. On Trilegy for reactive airway disease. Duo nebs prn. Encourage IS.   Monitor with increased exertion 12. Polysubstance abuse + Cocaine and THC on admit : Has been ongoing since 2015. Educate/encourage patient regarding life style changes.  Educated again. 13. Seizure disorder: Continue Keppra bid.   No breakthrough seizures since admission to rehab-7/6 14. Impaired fasting glucose/Hyperglycemia: Hgb A1c- 5.2.   Monitor with increased mobility 15. CKD stage III:   Cr.  1.28 on 7/5  Encourage fluids  Cont to monitor 16. Constipation  Bowel meds increased on 6/15, again on 6/16, decreased on 6/18, decreased again on 6/19  Improving 17.  Macrocytosis  Vitamin B12/folate within normal limits 18. Urinary retention/neurogenic bladder  Flomax increased on 6/23  Improved 19.  Atrial fibrillation  Rate controlled, noted on recent ECG as well 12.  Acute blood loss anemia-resolved  Vitamin B12/folate within normal limits  Hemoglobin 13.6 on 7/5  Continue to monitor  > 30 minutes spent in total in discharge planning between myself and PA regarding aforementioned, as well discussion regarding DME equipment, follow-up appointments, follow-up therapies, discharge medications, discharge recommendations, substance abuse  LOS: 22 days A FACE TO FACE EVALUATION WAS PERFORMED  Jabron Weese Karis Juba 03/28/2020, 9:05 AM

## 2020-03-29 ENCOUNTER — Encounter (HOSPITAL_COMMUNITY): Payer: Medicare HMO | Admitting: Internal Medicine

## 2020-03-30 ENCOUNTER — Encounter: Payer: Self-pay | Admitting: *Deleted

## 2020-03-30 ENCOUNTER — Other Ambulatory Visit: Payer: Self-pay | Admitting: *Deleted

## 2020-03-30 NOTE — Patient Outreach (Addendum)
[facility]) Care Management  03/30/2020  Kevin [name] 1951-02-21 [id number]   Transition of care   Referral date: 03/07/20 Referral source: [facility] Discharge Notification  Date of Admission 6/10-6/14/21 [name] Inpatient Rehab Diagnosis : Acute Ischemic Stroke  Date of Discharge: 03/28/20 Facility : [name] Insurance : [facility]  Outreach Attempt #1 Subjective  Successful outreach call to patient , able to speak with his wife Kevin [name], Designated party release. She reports patient is glad to be at home, stating it is a process and a little harder after this stroke.She expressed great appreciation of care he received while in rehab that helped him with progress he made. She discussed still keeping up a routine at home and working on some exercises patient was taught.       Social  Patient lives at home with his spouse that is able to assist as needed. She report having hospital bed,rand rolling walker, she states that patient would benefit from having a bedside commode sometimes he doesn't make it to the bathroom in time when needing to have a bowel movement.  Wife states she will be able to provide transportation to appointments, denies difficulty with getting patient into car, they have one step to enter home. Patient to begin outpatient physical therapy in the next   Conditions Wife discussed patient history of recent CVA, history of CVA, heart failure , atrial fib and COPD. She denies patient having any symptoms of worsening of shortness of breath , says breathing is actually a little better , he has lost some weight especially in stomach area and she feels this helps. Wife reports patient does not weigh on a daily basis , discussed benefit of monitoring weights to identify sudden changes to follow up with MD sooner.  Patient has history of Obstructive Sleep Apnea and has been prescribed a CPAP she has appointment to pick up machine  on 7/13 at [facility] .   Medications  Wife helps with organizing patient medications. She reports cost concerns with Entresto and Eliquis stating monthly cost is $100 and spending $189 per month includes all medication.    Appointments  Dr. Allena Katz on 7/20  Needs to reschedule appointment missed during hospital stay with , Dr. Jones Broom and [name], PA.  Advanced Directive Patient does not have a AD interested in completing one .    Consent  Explained and offered [facility] care management services for post hospital follow up and education and support on managing chronic conditions , wife is agreeable to services.   Plan Will plan return call in the next week, to complete assessment .  Will send patient [facility] calendar, EMMI on Advanced Directive and Packet, with Stroke EMMI handout.  Will send MD barrier/involvement letter  Will place [facility] pharmacy referral for medication cost concern. Addendum  Placed call to Dr. Allena Katz office able to leave a message to relay patient request for Bedside commode and wife requesting order be sent to [facility] for equipment.     [name], RN, BSN  [facility] Care Management,Care Management Coordinator  [phone]- Mobile [phone]- Toll Free Main Office

## 2020-03-31 ENCOUNTER — Telehealth: Payer: Self-pay

## 2020-03-31 ENCOUNTER — Other Ambulatory Visit: Payer: Self-pay | Admitting: Pharmacy Technician

## 2020-03-31 NOTE — Telephone Encounter (Signed)
Per Abelino Derrick with [facility] Mr. Trettel needs an order for a bedside commode .

## 2020-03-31 NOTE — Patient Outreach (Signed)
[facility]) Care Management  03/31/2020  Kevin [name] March 15, 1951 [id number]                                       Medication Assistance Referral  Referral From: [facility] RPh Doree Fudge  Medication/Company: Eliquis / BMS Patient application portion:  Mailed Provider application portion: Faxed  to Dr. Gala Romney Provider address/fax verified via: Office website  Medication/Company: Sherryll Burger / [employer] Patient application portion:  Mailed Provider application portion: Faxed  to Dr. Gala Romney Provider address/fax verified via: Office website  Medication/Company: Trelegy / GSK Patient application portion:  Mailed Provider application portion: Faxed  to [name], PA Provider address/fax verified via: Office website   Was unable to request Ventolin as GSK has removed it from the list of available medications thru their foundation.  Follow up:  Will follow up with patient in 5-15 business days to confirm application(s) have been received.  Dory Demont P. Logan Vegh, CPhT [employer]  [phone]

## 2020-03-31 NOTE — Patient Outreach (Signed)
Referral from [name], RN sent to [facility] for medication assistance.

## 2020-03-31 NOTE — Telephone Encounter (Signed)
Is this a request from the patient or from therapies?

## 2020-04-03 ENCOUNTER — Other Ambulatory Visit: Payer: Self-pay | Admitting: Pharmacy Technician

## 2020-04-03 ENCOUNTER — Telehealth (HOSPITAL_COMMUNITY): Payer: Self-pay | Admitting: Pharmacy Technician

## 2020-04-03 NOTE — Telephone Encounter (Signed)
Per [facility] nurse his wife requested the bed side toilet for him.

## 2020-04-03 NOTE — Telephone Encounter (Signed)
Received a message from [place] with [facility]. She sent over a patient assistance application for Entresto and Eliquis. The patient has a Orthoptist for [employer] and once the provider portion signed we can send in the BMS application. Will call patients pharmacy to make sure they have the grant info. Relayed all this to [place] as well.

## 2020-04-03 NOTE — Telephone Encounter (Signed)
He will be seen by therapies, if something has changed, and they feel that he needs it, I can order it.  Thanks.

## 2020-04-03 NOTE — Patient Outreach (Signed)
[facility]) Care Management  04/03/2020  Kevin [name] 12-Aug-1951 [id number]  Successful return call placed to [facility] at Dr. Prescott Gum office in regards to provider's portion of BMS application for Eliquis and [employer] application for [employer].  Spoke to [place] who informed Dr. Gala Romney was out of the office until next week and she would have him sign the Eliquis provider portion of the application when he returns.  She informed that patient has grant funds available thru the [employer] for [employer]. Informed Gentry Fitz that [facility] pharmacist was informed by wife that patient still has to pay $45 when he picks up the [facility] and the funds will eventually be exhausted and that is why we were trying to get him assistance for the [employer]. Lanora Manis informed that patient still had funds available with [employer] and has only used it once and that was in March. She informed he needs to use the funds for which he was approved and not patient assistance.  She informed she would call his pharmacy and inquire if they were billing the secondary piece to the [facility] foundation as his copay should be zero.  Will await return of BMS application for Eliquis.  Kevin [name] P. Josette Shimabukuro, CPhT [employer]  [phone]

## 2020-04-03 NOTE — Telephone Encounter (Signed)
Spoke with the patient's pharmacy and they were able to bill the grant along with the patient's insurance. They are going to get [facility] ready for him. Called and spoke with Mrs. Jorgensen and told her the update with the applications.   Will send Noreene Larsson the provider's portion once signed.  Will follow up.

## 2020-04-06 ENCOUNTER — Other Ambulatory Visit: Payer: Self-pay | Admitting: *Deleted

## 2020-04-06 DIAGNOSIS — Z0110 Encounter for examination of ears and hearing without abnormal findings: Secondary | ICD-10-CM | POA: Diagnosis not present

## 2020-04-06 DIAGNOSIS — Z131 Encounter for screening for diabetes mellitus: Secondary | ICD-10-CM | POA: Diagnosis not present

## 2020-04-06 DIAGNOSIS — Z125 Encounter for screening for malignant neoplasm of prostate: Secondary | ICD-10-CM | POA: Diagnosis not present

## 2020-04-06 DIAGNOSIS — Z1389 Encounter for screening for other disorder: Secondary | ICD-10-CM | POA: Diagnosis not present

## 2020-04-06 DIAGNOSIS — Z720 Tobacco use: Secondary | ICD-10-CM | POA: Diagnosis not present

## 2020-04-06 DIAGNOSIS — J410 Simple chronic bronchitis: Secondary | ICD-10-CM | POA: Diagnosis not present

## 2020-04-06 DIAGNOSIS — I509 Heart failure, unspecified: Secondary | ICD-10-CM | POA: Diagnosis not present

## 2020-04-06 DIAGNOSIS — Z0000 Encounter for general adult medical examination without abnormal findings: Secondary | ICD-10-CM | POA: Diagnosis not present

## 2020-04-06 DIAGNOSIS — I2510 Atherosclerotic heart disease of native coronary artery without angina pectoris: Secondary | ICD-10-CM | POA: Diagnosis not present

## 2020-04-06 NOTE — Patient Outreach (Signed)
[facility]) Care Management  [facility] Care Manager  04/06/2020   Kevin [name] 01/19/1951 [id number]  Subjective:   Transition of care   Referral date: 03/07/20 Referral source: [facility] Discharge Notification  Date of Admission 6/10-6/14/21 [name]14 Inpatient Rehab Diagnosis : Acute Ischemic Stroke  Date of Discharge: 03/28/20 Facility : [name] Insurance : [employer]   Subjective: Unsuccessful outreach attempt, no answer able to leave a HIPAA compliant voice mail message.   Plan Will plan return call in the next 4 business days if no return call on today.     [name], RN, BSN  [facility] Care Management,Care Management Coordinator  [phone]- Mobile [phone]- Toll Free Main Office

## 2020-04-07 ENCOUNTER — Ambulatory Visit: Payer: Medicare HMO | Attending: Physician Assistant

## 2020-04-10 ENCOUNTER — Other Ambulatory Visit: Payer: Self-pay | Admitting: Physical Medicine and Rehabilitation

## 2020-04-11 ENCOUNTER — Encounter: Payer: Medicare HMO | Admitting: Physical Medicine & Rehabilitation

## 2020-04-12 ENCOUNTER — Other Ambulatory Visit: Payer: Self-pay | Admitting: Pharmacy Technician

## 2020-04-12 ENCOUNTER — Other Ambulatory Visit: Payer: Self-pay | Admitting: *Deleted

## 2020-04-12 NOTE — Patient Outreach (Signed)
[facility]) Care Management  [facility] Care Manager  04/12/2020   Kevin [name] 1951-01-05 [id number]   Transition of care   Referral date: 03/07/20 Referral source: [facility] Discharge Notification  Date of Admission 6/10-6/14/21 [name], 6/14 Inpatient Rehab Diagnosis : Acute Ischemic Stroke  Date of Discharge: 03/28/20 Facility: [name] Rehab Insurance: [employer]   Subjective:  Successful outreach call to patient , able to speak with his wife Kevin [name].  She discussed patient is doing pretty good at home, tolerating walking in home. She states that he is tolerating diet well.  She denies any signs symptoms of increased  Weakness,or  stroke symptoms reviewed.  She reports attending post discharge visit with PCP on last week and has made changes to post discharge visit with Rehab MD.  She states patient outpatient neuro physical visit was scheduled on day office was closed and she plans to reschedule .    Encounter Medications:  Outpatient Encounter Medications as of 04/12/2020  Medication Sig  . albuterol (VENTOLIN HFA) 108 (90 Base) MCG/ACT inhaler Inhale 2 puffs into the lungs every 6 (six) hours as needed for wheezing or shortness of breath.  Marland Kitchen apixaban (ELIQUIS) 5 MG TABS tablet Take 1 tablet (5 mg total) by mouth 2 (two) times daily.  Marland Kitchen atorvastatin (LIPITOR) 40 MG tablet Take 1 tablet (40 mg total) by mouth daily at 6 PM.  . bethanechol (URECHOLINE) 25 MG tablet Take 1 tablet (25 mg total) by mouth 4 (four) times daily.  . carvedilol (COREG) 12.5 MG tablet Take 1 tablet (12.5 mg total) by mouth 2 (two) times daily with a meal.  . folic acid (FOLVITE) 1 MG tablet Take 1 tablet (1 mg total) by mouth daily. Need appointment before anymore refills  . furosemide (LASIX) 20 MG tablet Take 1 tablet (20 mg total) by mouth daily.  Marland Kitchen levETIRAcetam (KEPPRA) 1000 MG tablet Take 1 tablet (1,000 mg total) by mouth 2 (two) times daily.  . Multiple Vitamin  (MULTIVITAMIN WITH MINERALS) TABS tablet Take 1 tablet by mouth daily. Men's One a Day  . sacubitril-valsartan (ENTRESTO) 97-103 MG Take 1 tablet by mouth 2 (two) times daily.  Marland Kitchen spironolactone (ALDACTONE) 25 MG tablet Take 0.5 tablets (12.5 mg total) by mouth daily.  . tamsulosin (FLOMAX) 0.4 MG CAPS capsule Take 2 capsules (0.8 mg total) by mouth daily after supper.  . TRELEGY ELLIPTA 100-62.5-25 MCG/INH AEPB Take 1 puff by mouth daily.   No facility-administered encounter medications on file as of 04/12/2020.    Functional Status:  In your present state of health, do you have any difficulty performing the following activities: 03/30/2020 03/19/2020  Hearing? Y Y  Comment wears hearing aide -  Vision? Malvin Johns  Comment has cataracts, follow up appt scheduled for August 2021 -  Difficulty concentrating or making decisions? Malvin Johns  Walking or climbing stairs? Y -  Comment uses rolling walker and wife assit -  Dressing or bathing? Y N  Comment wife assist -  Doing errands, shopping? Y -  Comment wife assist -  Quarry manager and eating ? N -  Comment wife prepares meals -  Using the Toilet? Y -  Comment requires assistance -  In the past six months, have you accidently leaked urine? Y -  Do you have problems with loss of bowel control? Y -  Comment wears diapers -  Managing your Medications? Y -  Comment wife organizes and assist -  Managing your Finances? N -  Comment wife handles -  Housekeeping or managing your Housekeeping? (No Data) -  Comment wife completes -  Some recent data might be hidden    Fall/Depression Screening: Fall Risk  03/30/2020  Falls in the past year? 1  Number falls in past yr: 0  Injury with Fall? 0  Risk for fall due to : Impaired mobility;Impaired balance/gait;History of fall(s)  Follow up Falls prevention discussed   No flowsheet data found.  Assessment:   CVA Patient tolerating mobility progression in home. Outpatient therapy visit to be rescheduled.   Patient continues use of walker , she declines need for bedside commode at this time patient managing well at home.No new stroke signs symptoms.   Appointments Needs to reschedule missed appointment with cardiology during hospital stay, wife agreeable to rescheduling, declines need for assistance.  She has rescheduled appointment to pick up CPAP and receive instructions.   Plan:  Will plan return Transition of care  call in the next week to complete assessment .   [facility] CM Care Plan Problem One     Most Recent Value  Care Plan Problem One Knowledge deficit of Self care management of post stroke care   Role Documenting the Problem One Care Management Telephonic Coordinator  Care Plan for Problem One Active  [facility] Long Term Goal  Patient will not experience a hospital readmission over the next 60 days   [facility] Long Term Goal Start Date 03/30/20  Interventions for Problem One Long Term Goal Discussed patient current clinical state, verifed patient has all medications and importance of taking as prescribed. Verifed patient recieved ADvanced packet and review of community resource for use to get notarized.    [facility] CM Short Term Goal #1  Over the next 30 days patient will attend all post discharge medical appointments   [facility] CM Short Term Goal #1 Start Date 03/30/20  Interventions for Short Term Goal #1 stressed importance of rescheduling missed appointments , with cardiology , outpatient rehab. REviewed upcoming appointments and encouraged attendance. Verified transportation and discussed [employer] transportation benefit.   [facility] CM Short Term Goal #2  Over the next 30 days patient will demonstrate continued improvement with mobiity and self care as evidenced by report    [facility] CM Short Term Goal #2 Start Date 03/30/20  Interventions for Short Term Goal #2 Reinforced continued daily exercise plan as provided during inpatient rehab stay, reinforced benefits of continuing participation in outpatient therapies to  help with continued progress. Reviewed fall safety precautions, use of walker supportive footwear .   [facility] CM Short Term Goal #3 Over the next 30 days patient/wife will be able to identify at least 3 stroke symptoms and action plan   [facility] CM Short Term Goal #3 Start Date 04/12/20  Interventions for Short Tern Goal #3 Review of stroke symptoms , with emmi handout encouraged continued review of material . Stressed importance of action plan seeking emergent follow up. Verifed no new signs symptoms.        [name], RN, BSN  [facility] Care Management,Care Management Coordinator  [phone]- Mobile [phone]- Toll Free Main Office

## 2020-04-12 NOTE — Patient Outreach (Signed)
[facility]) Care Management  04/12/2020  Kevin [name] Sep 22, 1951 [id number]   Unsuccessful call placed to patient regarding patient assistance application(s) for Eliquis with BMS and Trelegy with GSK , HIPAA compliant voicemail left.   Was calling patient to inquire if he has received the applications that were mailed to him on 03/31/2020. Unfortunately he did not answer the phone.  Follow up:  Will follow up with patient in 3-7 business days if call is not returned.  Daizee Firmin P. Terriah Reggio, CPhT [employer]  [phone]

## 2020-04-14 NOTE — Telephone Encounter (Signed)
Sent in the providers portion via fax 07/22.

## 2020-04-17 ENCOUNTER — Other Ambulatory Visit: Payer: Self-pay | Admitting: Pharmacy Technician

## 2020-04-17 NOTE — Patient Outreach (Signed)
[facility]) Care Management  04/17/2020  Kevin [name] [birth year] [id number]    Successful call placed to patient regarding patient assistance application(s) for Trelegy with GSK and Eliquis with BMS , HIPAA identifiers verified.   Spoke to patient's spouse, Kevin [name]. HIPAA was verified. [name] informed she mailed back the application on Friday.  Follow up:  Will route note to [facility] RPh Nunzio Cobbs for case closure if document(s) have not been received in the next 15 business days.  [name] P. [name], CPhT [employer]  [phone]

## 2020-04-18 ENCOUNTER — Other Ambulatory Visit: Payer: Self-pay | Admitting: Pharmacy Technician

## 2020-04-18 NOTE — Patient Outreach (Signed)
[facility]) Care Management  04/18/2020  Kevin [name] 07/14/1951 [id number]  Care coordination call placed to [name], PA's office in regards to provider portion of [employer] application.  Spoke to [name] and informed her that we are trying to obtain patient assistance for Trelegy thru [employer]. In order to submit the application, we are in need of a prescription to be faxed over to [place] to put with the application so we can submit it altogether. [name].  Will await a return fax.  [name] [name], CPhT [facility]  [phone]

## 2020-04-18 NOTE — Patient Outreach (Signed)
[facility]) Care Management  04/18/2020  NOLON YELLIN Oct 09, 1950 [id number]   ADDENDUM  Successful outreach call placed to patient in regards to [employer] application for Entresto, BMS application for Eliquis and GSK application for Trelegy.  Spoke to patient's wife, Stefano Gaul. HIPAA identifiers verified.  Informed Stefano Gaul that I had received the applications back but that I was missing the proof of income for the household and the out of pocket expenditure from the pharmacy. Stefano Gaul informed she wanted to try and submit the applications without having to send in the proof of income. Informed her that we could try that but they may come back and say they need it. Estelle verbalized understanding. Stefano Gaul apologized for forgetting to send back the OOP and said she would get that tomorrow and send it in. She informed she has my business card and would mail it to the [employer] address.   Informed Stefano Gaul that we were not going to pursue the Entresto with [employer] as Lanora Manis at Dr. Prescott Gum office wants patient to continue to use the funds he is receiving from the [employer]. Estelle verbalized understanding.  Will await return of documents. Will close case if documents not received in the next 15 business days.  Adora Yeh P. Mckenzy Salazar, CPhT [facility]  [phone]

## 2020-04-19 ENCOUNTER — Other Ambulatory Visit: Payer: Self-pay | Admitting: *Deleted

## 2020-04-19 NOTE — Patient Outreach (Addendum)
[facility]) Care Management  04/19/2020  Kevin [name] 06-30-1951 [id number]   Transition of care   Referral date: 03/07/20 Referral source: [facility] Discharge Notification  Date of Admission 6/10-6/14/21 [name], 6/14 Inpatient Rehab Diagnosis : Acute Ischemic Stroke  Date of Discharge: 03/28/20 Facility: [name] Insurance: [employer]   Subjective: Unsuccessful outreach call to patient/wife, no answer able to leave a HIPAA compliant message for return call  Unable to complete all of 30 day assessment thus far due to some unsuccessful outreaches.   Plan If no return call will schedule call to patient in the next 4 business days with plan to complete assessment.    Kevin Garibaldi, RN, BSN  [facility] Care Management,Care Management Coordinator  [phone]- Mobile [phone]- Toll Free Main Office

## 2020-04-20 ENCOUNTER — Other Ambulatory Visit: Payer: Self-pay | Admitting: Physical Medicine and Rehabilitation

## 2020-04-24 ENCOUNTER — Other Ambulatory Visit: Payer: Self-pay | Admitting: *Deleted

## 2020-04-24 ENCOUNTER — Other Ambulatory Visit: Payer: Self-pay | Admitting: Physical Medicine and Rehabilitation

## 2020-04-24 NOTE — Telephone Encounter (Signed)
He should not already require a refill.  Thanks.

## 2020-04-24 NOTE — Patient Outreach (Signed)
[facility]) Care Management  [facility] Care Manager  04/24/2020   Kevin [name] Dec 27, 1950 [id number]    Transition of care   Referral date: 03/07/20 Referral source: [facility] Discharge Notification  Date of Admission 6/10-6/14/21 [name] Inpatient Rehab Diagnosis : Acute Ischemic Stroke , Right hemiparesis. Date of Discharge: 03/28/20 [name] Insurance: [employer]   69 year old male with past medical history for PAD, CAD , Hypertension, TIA, Permanent Atrial fib, Hepatitis C.polysubstance abuse, heart failure .     Subjective:  Successful outreach call to patient and wife. Patient reports that he is doing pretty good. He discussed tolerating walks in home. He discussed being a little disappointed that he can not drive yet but states he understands why. Patient denies pain shortness of breath, increased swelling.  dizziness.   Encounter Medications:  Outpatient Encounter Medications as of 04/24/2020  Medication Sig  . albuterol (VENTOLIN HFA) 108 (90 Base) MCG/ACT inhaler Inhale 2 puffs into the lungs every 6 (six) hours as needed for wheezing or shortness of breath.  Marland Kitchen apixaban (ELIQUIS) 5 MG TABS tablet Take 1 tablet (5 mg total) by mouth 2 (two) times daily.  Marland Kitchen atorvastatin (LIPITOR) 40 MG tablet Take 1 tablet (40 mg total) by mouth daily at 6 PM.  . bethanechol (URECHOLINE) 25 MG tablet Take 1 tablet (25 mg total) by mouth 4 (four) times daily.  . carvedilol (COREG) 12.5 MG tablet Take 1 tablet (12.5 mg total) by mouth 2 (two) times daily with a meal.  . folic acid (FOLVITE) 1 MG tablet Take 1 tablet (1 mg total) by mouth daily. Need appointment before anymore refills  . furosemide (LASIX) 20 MG tablet Take 1 tablet (20 mg total) by mouth daily.  Marland Kitchen levETIRAcetam (KEPPRA) 1000 MG tablet Take 1 tablet (1,000 mg total) by mouth 2 (two) times daily.  . Multiple Vitamin (MULTIVITAMIN WITH MINERALS) TABS tablet Take 1 tablet by mouth  daily. Men's One a Day  . sacubitril-valsartan (ENTRESTO) 97-103 MG Take 1 tablet by mouth 2 (two) times daily.  Marland Kitchen spironolactone (ALDACTONE) 25 MG tablet Take 0.5 tablets (12.5 mg total) by mouth daily.  . tamsulosin (FLOMAX) 0.4 MG CAPS capsule Take 2 capsules (0.8 mg total) by mouth daily after supper.  . TRELEGY ELLIPTA 100-62.5-25 MCG/INH AEPB Take 1 puff by mouth daily.   No facility-administered encounter medications on file as of 04/24/2020.    Functional Status:  In your present state of health, do you have any difficulty performing the following activities: 03/30/2020 03/19/2020  Hearing? Y Y  Comment wears hearing aide -  Vision? Malvin Johns  Comment has cataracts, follow up appt scheduled for August 2021 -  Difficulty concentrating or making decisions? Malvin Johns  Walking or climbing stairs? Y -  Comment uses rolling walker and wife assit -  Dressing or bathing? Y N  Comment wife assist -  Doing errands, shopping? Y -  Comment wife assist -  Quarry manager and eating ? N -  Comment wife prepares meals -  Using the Toilet? Y -  Comment requires assistance -  In the past six months, have you accidently leaked urine? Y -  Do you have problems with loss of bowel control? Y -  Comment wears diapers -  Managing your Medications? Y -  Comment wife organizes and assist -  Managing your Finances? N -  Comment wife handles -  Housekeeping or managing your Housekeeping? N -  Comment  wife completes -  Some recent data might be hidden    Fall/Depression Screening: Fall Risk  03/30/2020  Falls in the past year? 1  Number falls in past yr: 0  Injury with Fall? 0  Risk for fall due to : Impaired mobility;Impaired balance/gait;History of fall(s)  Follow up Falls prevention discussed   PHQ 2/9 Scores 04/24/2020  PHQ - 2 Score 1      SDOH Screenings   Alcohol Screen:   . Last Alcohol Screening Score (AUDIT):   Depression (PHQ2-9): Low Risk   . PHQ-2 Score: 1  Financial Resource Strain: Low  Risk   . Difficulty of Paying Living Expenses: Not hard at all  Food Insecurity: No Food Insecurity  . Worried About Programme researcher, broadcasting/film/video in the Last Year: Never true  . Ran Out of Food in the Last Year: Never true  Housing: Low Risk   . Last Housing Risk Score: 0  Physical Activity:   . Days of Exercise per Week:   . Minutes of Exercise per Session:   Social Connections:   . Frequency of Communication with Friends and Family:   . Frequency of Social Gatherings with Friends and Family:   . Attends Religious Services:   . Active Member of Clubs or Organizations:   . Attends Banker Meetings:   Marland Kitchen Marital Status:   Stress:   . Feeling of Stress :   Tobacco Use: High Risk  . Smoking Tobacco Use: Current Every Day Smoker  . Smokeless Tobacco Use: Never Used  Transportation Needs: No Transportation Needs  . Lack of Transportation (Medical): No  . Lack of Transportation (Non-Medical): No    Assessment:   Stroke Patient continues with safe mobility at home using rolling walker, no falls.   Patient stills rescheduling follow up appointment with outpatient therapies and neurologist. Wife  needed clarification on appointments for  rescheduling.  Wife agreeable to  rescheduling.    Plan:  Will plan final transition of care call  in the next week, will follow up on patient getting CPAP, heart failure management ,patient with  limited time with outreach call today  Will send wife by secure  email to her personal email per her request , contact information for  Neurologist and Outpatient Rehab.   Will send PCP this initial visit note.   [facility] CM Care Plan Problem One     Most Recent Value  Care Plan Problem One Knowledge deficit of Self care management of post stroke care   Role Documenting the Problem One Care Management Telephonic Coordinator  Care Plan for Problem One Active  [facility] Long Term Goal  Patient will not experience a hospital readmission over the next 60 days   [facility]  Long Term Goal Start Date 03/30/20  Interventions for Problem One Long Term Goal Reviewed patient current clinical state, reinforced notifying MD of any new or worsening concerns worsening symptoms of swelling, shortness of breath, as signs of heart failure, encourage monitoring weights as able to balance on scales. continue taking medications as  prescribed, benefit of post stroke follow up and therapy participation to  benefit progress after having stroke.   [facility] CM Short Term Goal #1  Over the next 30 days patient will attend all post discharge medical appointments   [facility] CM Short Term Goal #1 Start Date 03/30/20  Interventions for Short Term Goal #1 Will send to email to patient with contact numbers to schedule neurology and rehab apppointments . Explained  difference between visit with Rehab MD and Neurology follow up as well as Rehab therapy appointments.   [facility] CM Short Term Goal #2  Over the next 30 days patient will demonstrate continued improvement with mobiity and self care as evidenced by report    [facility] CM Short Term Goal #2 Start Date 03/30/20  Interventions for Short Term Goal #2 Encouraged safety with using rolling walker and continuing to walker at all times.   [facility] CM Short Term Goal #3 Over the next 30 days patient/wife will be able to identify at least 3 stroke symptoms and action plan   [facility] CM Short Term Goal #3 Start Date 04/12/20  Interventions for Short Tern Goal #3 Discussed signs/symptoms of stroke with teachback.       [name], RN, BSN  [facility] Care Management,Care Management Coordinator  [phone]- Mobile [phone]- Toll Free Main Office

## 2020-04-24 NOTE — Telephone Encounter (Signed)
Refill request for Carvedilol. Patient has appt 8/5. Patient does have PCP no Cardiologist listed. Okay to refill or send to PCP?

## 2020-04-25 NOTE — Telephone Encounter (Signed)
Last prescribed by Pam on 03/28/2020

## 2020-04-25 NOTE — Telephone Encounter (Signed)
He should follow up with his PCP.  Thanks.

## 2020-04-26 ENCOUNTER — Other Ambulatory Visit: Payer: Self-pay | Admitting: Physical Medicine and Rehabilitation

## 2020-04-26 DIAGNOSIS — R768 Other specified abnormal immunological findings in serum: Secondary | ICD-10-CM | POA: Diagnosis not present

## 2020-04-26 DIAGNOSIS — N1831 Chronic kidney disease, stage 3a: Secondary | ICD-10-CM | POA: Diagnosis not present

## 2020-04-26 DIAGNOSIS — I739 Peripheral vascular disease, unspecified: Secondary | ICD-10-CM | POA: Diagnosis not present

## 2020-04-26 DIAGNOSIS — Z720 Tobacco use: Secondary | ICD-10-CM | POA: Diagnosis not present

## 2020-04-26 DIAGNOSIS — I639 Cerebral infarction, unspecified: Secondary | ICD-10-CM | POA: Diagnosis not present

## 2020-04-26 DIAGNOSIS — I509 Heart failure, unspecified: Secondary | ICD-10-CM | POA: Diagnosis not present

## 2020-04-26 DIAGNOSIS — E785 Hyperlipidemia, unspecified: Secondary | ICD-10-CM | POA: Diagnosis not present

## 2020-04-26 DIAGNOSIS — I129 Hypertensive chronic kidney disease with stage 1 through stage 4 chronic kidney disease, or unspecified chronic kidney disease: Secondary | ICD-10-CM | POA: Diagnosis not present

## 2020-04-26 DIAGNOSIS — N189 Chronic kidney disease, unspecified: Secondary | ICD-10-CM | POA: Diagnosis not present

## 2020-04-26 DIAGNOSIS — E1122 Type 2 diabetes mellitus with diabetic chronic kidney disease: Secondary | ICD-10-CM | POA: Diagnosis not present

## 2020-04-26 DIAGNOSIS — F1411 Cocaine abuse, in remission: Secondary | ICD-10-CM | POA: Diagnosis not present

## 2020-04-27 ENCOUNTER — Encounter: Payer: Medicare HMO | Admitting: Physical Medicine & Rehabilitation

## 2020-04-28 DIAGNOSIS — I6340 Cerebral infarction due to embolism of unspecified cerebral artery: Secondary | ICD-10-CM | POA: Diagnosis not present

## 2020-04-29 ENCOUNTER — Other Ambulatory Visit: Payer: Self-pay | Admitting: Physical Medicine and Rehabilitation

## 2020-05-01 ENCOUNTER — Other Ambulatory Visit: Payer: Self-pay | Admitting: *Deleted

## 2020-05-01 ENCOUNTER — Other Ambulatory Visit: Payer: Self-pay | Admitting: Internal Medicine

## 2020-05-01 DIAGNOSIS — N1831 Chronic kidney disease, stage 3a: Secondary | ICD-10-CM

## 2020-05-01 NOTE — Patient Outreach (Signed)
[facility]) Care Management  [facility] Care Manager  05/01/2020   Kevin [name] Mar 16, 1951 [id number]  Transition of care   Referral date: 03/07/20 Referral source: [facility] Discharge Notification  Date of Admission 6/10-6/14/21 [name]14 Inpatient Rehab Diagnosis : Acute Ischemic Stroke , Right hemiparesis. Date of Discharge: 03/28/20 Facility: [name] Insurance: [employer]   69 year old male with past medical history for PAD, CAD , Hypertension, TIA, Permanent Atrial fib, Hepatitis C.polysubstance abuse, heart failure .    Outreach attempt  Subjective: Unsuccessful call attempt to patient/wife no answer able to leave a HIPAA compliant message for return call.   Plan Completed transition of care program.  Will plan return call in the next 4 business days .    [name], RN, BSN  [facility] Care Management,Care Management Coordinator  [phone]- Mobile [phone]- Toll Free Main Office

## 2020-05-02 ENCOUNTER — Other Ambulatory Visit: Payer: Self-pay | Admitting: Physical Medicine and Rehabilitation

## 2020-05-04 ENCOUNTER — Other Ambulatory Visit: Payer: Self-pay | Admitting: *Deleted

## 2020-05-04 NOTE — Patient Outreach (Signed)
[facility]) Care Management  [facility] Care Manager  05/04/2020   Kevin [name] December 12, 1950 [id number]   Telephone assessment   Referral date: 03/07/20 Referral source: [name] Discharge Notification  Date of Admission 6/10-6/14/21 Kevin [name], 6/14 Inpatient Rehab Diagnosis : Acute Ischemic Stroke, Right hemiparesis. Date of Discharge: 03/28/20 Facility: [name] Insurance: [employer]  69 year old male with past medical history for PAD, CAD , Hypertension, TIA, Permanent Atrial fib, Hepatitis C.polysubstance abuse, heart failure .  Outreach attempt #2 Subjective: Unsuccessful outreach call to patient/wife, no answer able to leave a HIPAA compliant voice message requesting return call.    Plan Will await return call in no response will attempt return call in the next 4 business days    Kevin Garibaldi, RN, BSN  [name] Care Management,Care Management Coordinator  [phone]- Mobile [phone]- Toll Free Main Office

## 2020-05-09 ENCOUNTER — Other Ambulatory Visit: Payer: Medicare HMO

## 2020-05-09 ENCOUNTER — Other Ambulatory Visit: Payer: Self-pay | Admitting: *Deleted

## 2020-05-09 ENCOUNTER — Ambulatory Visit
Admission: RE | Admit: 2020-05-09 | Discharge: 2020-05-09 | Disposition: A | Discharge status: Home / Self Care | Payer: Medicare HMO | Source: Ambulatory Visit | Attending: Internal Medicine | Admitting: Internal Medicine

## 2020-05-09 DIAGNOSIS — N183 Chronic kidney disease, stage 3 unspecified: Secondary | ICD-10-CM | POA: Diagnosis not present

## 2020-05-09 DIAGNOSIS — N1831 Chronic kidney disease, stage 3a: Secondary | ICD-10-CM

## 2020-05-09 DIAGNOSIS — N281 Cyst of kidney, acquired: Secondary | ICD-10-CM | POA: Diagnosis not present

## 2020-05-09 DIAGNOSIS — N200 Calculus of kidney: Secondary | ICD-10-CM | POA: Diagnosis not present

## 2020-05-09 NOTE — Patient Outreach (Signed)
[facility]) Care Management  [facility] Care Manager  05/09/2020   TONY GRANQUIST 1951/07/22 [id number]   Telephone assessment   Referral date: 03/07/20 Referral source: [facility] Discharge Notification  Date of Admission 6/10-6/14/21 [name], 6[facility]14 Inpatient Rehab Diagnosis : Acute Ischemic Stroke, Right hemiparesis. Date of Discharge: 03/28/20 Facility: [name] Insurance: [employer]  69 year old male with past medical history for PAD, CAD , Hypertension, TIA, Permanent Atrial fib, Hepatitis C.polysubstance abuse, heart failure .  Outreach attempt #3 for follow up call after transition of care. Subjective: Unsuccessful outreach call to patient/wife, no answer able to leave a HIPAA compliant voice message requesting return call.    Plan Will await return call in no response will attempt return call in the next 4 weeks.    [name], RN, BSN  [facility] Care Management,Care Management Coordinator  [phone]- Mobile [phone]- Toll Free Main Office

## 2020-05-25 ENCOUNTER — Other Ambulatory Visit: Payer: Self-pay | Admitting: Physical Medicine and Rehabilitation

## 2020-05-29 DIAGNOSIS — I6340 Cerebral infarction due to embolism of unspecified cerebral artery: Secondary | ICD-10-CM | POA: Diagnosis not present

## 2020-06-01 ENCOUNTER — Other Ambulatory Visit: Payer: Self-pay | Admitting: Physical Medicine and Rehabilitation

## 2020-06-08 ENCOUNTER — Other Ambulatory Visit: Payer: Self-pay | Admitting: *Deleted

## 2020-06-08 NOTE — Patient Outreach (Addendum)
[facility]) Care Management  06/08/2020  JAIQUAN TEMME Nov 03, 1950 [id number]   RAYMONDO GARCIALOPEZ [birth year] [id number]   Telephone assessment  Referral date: 03/07/20 Referral source: [facility] Discharge Notification  Date of Admission 6/10-6/14/21 [name], 6/14 Inpatient Rehab Diagnosis : Acute Ischemic Stroke, Right hemiparesis. Date of Discharge: 03/28/20 [facility]: [name] Insurance: [employer]  69 year old male with past medical history for PAD, CAD , Hypertension, TIA, Permanent Atrial fib, Hepatitis C.polysubstance abuse, heart failure .  Subjective: Unsuccessful outreach call to patient/wife, no answer able to leave a HIPAA compliant voice message requesting return call.  Placed call to patient mobile number no answer able to leave a HIPAA compliant message for return call.   Plan Will await return call in no response will attempt return call in the next 4 weeks.  Will send patient unsuccessful outreach letter, will send PCP barrier/involvment letter .   [name], RN, BSN  [facility] Care Management,Care Management Coordinator  [phone]- Mobile [phone]- Toll Free Main Office

## 2020-06-09 ENCOUNTER — Other Ambulatory Visit: Payer: Self-pay | Admitting: Nurse Practitioner

## 2020-06-09 DIAGNOSIS — B182 Chronic viral hepatitis C: Secondary | ICD-10-CM | POA: Diagnosis not present

## 2020-06-09 DIAGNOSIS — K7460 Unspecified cirrhosis of liver: Secondary | ICD-10-CM | POA: Diagnosis not present

## 2020-06-09 DIAGNOSIS — K7469 Other cirrhosis of liver: Secondary | ICD-10-CM | POA: Diagnosis not present

## 2020-06-09 DIAGNOSIS — D696 Thrombocytopenia, unspecified: Secondary | ICD-10-CM | POA: Diagnosis not present

## 2020-06-14 DIAGNOSIS — I10 Essential (primary) hypertension: Secondary | ICD-10-CM | POA: Diagnosis not present

## 2020-06-14 DIAGNOSIS — I509 Heart failure, unspecified: Secondary | ICD-10-CM | POA: Diagnosis not present

## 2020-06-14 DIAGNOSIS — I2510 Atherosclerotic heart disease of native coronary artery without angina pectoris: Secondary | ICD-10-CM | POA: Diagnosis not present

## 2020-06-14 DIAGNOSIS — E785 Hyperlipidemia, unspecified: Secondary | ICD-10-CM | POA: Diagnosis not present

## 2020-06-14 DIAGNOSIS — I480 Paroxysmal atrial fibrillation: Secondary | ICD-10-CM | POA: Diagnosis not present

## 2020-06-14 DIAGNOSIS — N182 Chronic kidney disease, stage 2 (mild): Secondary | ICD-10-CM | POA: Diagnosis not present

## 2020-06-14 DIAGNOSIS — Z720 Tobacco use: Secondary | ICD-10-CM | POA: Diagnosis not present

## 2020-06-14 DIAGNOSIS — J410 Simple chronic bronchitis: Secondary | ICD-10-CM | POA: Diagnosis not present

## 2020-06-19 ENCOUNTER — Other Ambulatory Visit: Payer: Medicare HMO

## 2020-06-22 ENCOUNTER — Other Ambulatory Visit: Payer: Self-pay | Admitting: Physical Medicine and Rehabilitation

## 2020-06-22 DIAGNOSIS — I509 Heart failure, unspecified: Secondary | ICD-10-CM | POA: Diagnosis not present

## 2020-06-22 DIAGNOSIS — Z0001 Encounter for general adult medical examination with abnormal findings: Secondary | ICD-10-CM | POA: Diagnosis not present

## 2020-06-22 DIAGNOSIS — N182 Chronic kidney disease, stage 2 (mild): Secondary | ICD-10-CM | POA: Diagnosis not present

## 2020-06-22 DIAGNOSIS — E785 Hyperlipidemia, unspecified: Secondary | ICD-10-CM | POA: Diagnosis not present

## 2020-06-22 DIAGNOSIS — I2510 Atherosclerotic heart disease of native coronary artery without angina pectoris: Secondary | ICD-10-CM | POA: Diagnosis not present

## 2020-06-22 DIAGNOSIS — J410 Simple chronic bronchitis: Secondary | ICD-10-CM | POA: Diagnosis not present

## 2020-06-22 DIAGNOSIS — I10 Essential (primary) hypertension: Secondary | ICD-10-CM | POA: Diagnosis not present

## 2020-06-22 DIAGNOSIS — Z720 Tobacco use: Secondary | ICD-10-CM | POA: Diagnosis not present

## 2020-06-22 DIAGNOSIS — I480 Paroxysmal atrial fibrillation: Secondary | ICD-10-CM | POA: Diagnosis not present

## 2020-06-28 DIAGNOSIS — I6340 Cerebral infarction due to embolism of unspecified cerebral artery: Secondary | ICD-10-CM | POA: Diagnosis not present

## 2020-06-29 ENCOUNTER — Other Ambulatory Visit: Payer: Self-pay

## 2020-06-29 ENCOUNTER — Encounter: Payer: Self-pay | Admitting: Physical Medicine & Rehabilitation

## 2020-06-29 ENCOUNTER — Encounter: Payer: Medicare HMO | Attending: Physical Medicine & Rehabilitation | Admitting: Physical Medicine & Rehabilitation

## 2020-06-29 VITALS — BP 126/82 | HR 85 | Temp 98.9°F | Ht 72.0 in | Wt 225.6 lb

## 2020-06-29 DIAGNOSIS — R269 Unspecified abnormalities of gait and mobility: Secondary | ICD-10-CM | POA: Diagnosis not present

## 2020-06-29 DIAGNOSIS — G8191 Hemiplegia, unspecified affecting right dominant side: Secondary | ICD-10-CM | POA: Diagnosis not present

## 2020-06-29 DIAGNOSIS — K5901 Slow transit constipation: Secondary | ICD-10-CM | POA: Diagnosis not present

## 2020-06-29 DIAGNOSIS — I5022 Chronic systolic (congestive) heart failure: Secondary | ICD-10-CM | POA: Insufficient documentation

## 2020-06-29 DIAGNOSIS — I639 Cerebral infarction, unspecified: Secondary | ICD-10-CM | POA: Diagnosis not present

## 2020-06-29 DIAGNOSIS — G40901 Epilepsy, unspecified, not intractable, with status epilepticus: Secondary | ICD-10-CM | POA: Insufficient documentation

## 2020-06-29 NOTE — Patient Instructions (Signed)
Please follow up with    [facility] Follow up.   Why: for post stroke follow up Contact information: [address]     [address] [place] [address] [phone]

## 2020-06-29 NOTE — Progress Notes (Signed)
Subjective:    Patient ID: Kevin [name], male    DOB: 11-01-1950, 69 y.o.   MRN: [id number]  HPI LH-male with history of HTN, CAD with chronic systolic CHF, CKD, seizures, cirrhosis of the liver, chronic thrombocytopenia, polysubstance abuse, CAF-on Eliquis, prior TIA/CVA presents for hospital follow up after receiving CIR for Left ACA/MCA watershed infarct, small left basal ganglia and thalamic infarct, left parietal occipital infarct on 03/02/20.  At discharge, he was instructed to follow up with Neuro, whom he does not believe he has seen.  Limited historian. On chart review, no Neuro notes. He states he has not seen GI, but later indicates he has his liver evaluated with ?"instagram". BP is controlled. He states he checks his weights daily and have been stable. Denies seizures.  States he was told kidney are healing. Denies falls. Bowel movements are good with prune juice. Denies urinary symptoms.  Therapies: Released, does not believe he needs outpatient therapies  Pain Inventory Average Pain 5 Pain Right Now 0 My pain is intermittent and spasm  In the last 24 hours, has pain interfered with the following? General activity 0 Relation with others 0 Enjoyment of life 0 What TIME of day is your pain at its worst? varies Sleep (in general) Fair  Pain is worse with: walking and standing Pain improves with: rest Relief from Meds: 8  use a cane use a walker ability to climb steps?  no do you drive?  yes Do you have any goals in this area?  yes  disabled: date disabled 2014 I need assistance with the following:  bathing, meal prep, household duties, shopping and Wife takes care of patient Do you have any goals in this area?  yes  weakness trouble walking spasms confusion depression  New Patient  New Patient    Family History  Problem Relation Age of Onset  . Seizures Daughter   . Cancer Mother    Social History   Socioeconomic History  . Marital status:  Married    Spouse name: Stefano Gaul  . Number of children: 5  . Years of education: 10  . Highest education level: Not on file  Occupational History  . Occupation: Retired    Associate Professor: [employer]  Tobacco Use  . Smoking status: Former Smoker    Packs/day: 0.25    Years: 40.00    Pack years: 10.00    Types: Cigarettes  . Smokeless tobacco: Never Used  Vaping Use  . Vaping Use: Never used  Substance and Sexual Activity  . Alcohol use: Not Currently    Alcohol/week: 0.0 - 16.0 standard drinks    Comment: He used to drink up to 1/2 pint daily; 6 beers. Quit in March 2015  . Drug use: No    Comment: cocaine, marijuana  . Sexual activity: Yes    Partners: Female  Other Topics Concern  . Not on file  Social History Narrative   Lives with his wife.  Was raised by a non-related family from the age of 14 years, and so knows very little about his biological family history.   Social Determinants of Health   Financial Resource Strain: Low Risk   . Difficulty of Paying Living Expenses: Not hard at all  Food Insecurity: No Food Insecurity  . Worried About Programme researcher, broadcasting/film/video in the Last Year: Never true  . Ran Out of Food in the Last Year: Never true  Transportation Needs: No Transportation Needs  . Lack of Transportation (Medical):  No  . Lack of Transportation (Non-Medical): No  Physical Activity:   . Days of Exercise per Week: Not on file  . Minutes of Exercise per Session: Not on file  Stress:   . Feeling of Stress : Not on file  Social Connections:   . Frequency of Communication with Friends and Family: Not on file  . Frequency of Social Gatherings with Friends and Family: Not on file  . Attends Religious Services: Not on file  . Active Member of Clubs or Organizations: Not on file  . Attends Banker Meetings: Not on file  . Marital Status: Not on file   Past Surgical History:  Procedure Laterality Date  . Cardiolite     negative for ischemia  .  CARDIOVASCULAR STRESS TEST    . CARDIOVERSION  05/05/2012   Procedure: CARDIOVERSION;  Surgeon: Thurmon Fair, MD;  Location: [facility] ENDOSCOPY;  Service: Cardiovascular;  Laterality: N/A;  . CAROTID ARTERY ANGIOPLASTY    . DOPPLER ECHOCARDIOGRAPHY    . ENDOVASCULAR STENT INSERTION  right leg  . LEFT HEART CATHETERIZATION WITH CORONARY ANGIOGRAM N/A 05/01/2012   Procedure: LEFT HEART CATHETERIZATION WITH CORONARY ANGIOGRAM;  Surgeon: Marykay Lex, MD;  Location: [facility] CATH LAB;  Service: Cardiovascular;  Laterality: N/A;  . LEFT HEART CATHETERIZATION WITH CORONARY ANGIOGRAM N/A 09/27/2014   Procedure: LEFT HEART CATHETERIZATION WITH CORONARY ANGIOGRAM;  Surgeon: Chrystie Nose, MD;  Location: [facility] CATH LAB;  Service: Cardiovascular;  Laterality: N/A;  . TEE WITHOUT CARDIOVERSION  05/05/2012   Procedure: TRANSESOPHAGEAL ECHOCARDIOGRAM (TEE);  Surgeon: Thurmon Fair, MD;  Location: [facility] ENDOSCOPY;  Service: Cardiovascular;  Laterality: N/A;   Past Medical History:  Diagnosis Date  . Atrial fibrillation, permanent (HCC) 02/01/2016  . Blood transfusion without reported diagnosis   . CAD in native artery cath 2016 90% mRamus  02/01/2016  . CHF (congestive heart failure) (HCC)   . Cholelithiases 05/01/2012  . Claudication (HCC)    bilateral  . Cocaine abuse (HCC)   . Dysrhythmia    afib  . ETOH abuse   . Hepatitis C antibody test positive 04/2012  . Hyperpigmentation   . Hypertension   . PAD (peripheral artery disease) (HCC)   . Persistent atrial fibrillation (HCC) 08/23/2014  . Thrombocytopenia (HCC)   . Tobacco abuse    BP 126/82   Pulse 85   Temp 98.9 F (37.2 C)   Ht 6' (1.829 m)   Wt 225 lb 9.6 oz (102.3 kg)   SpO2 96%   BMI 30.60 kg/m   Opioid Risk Score:   Fall Risk Score:  `1  Depression screen PHQ 2/9  Depression screen [facility] 2/9 06/29/2020 04/24/2020  Decreased Interest 0 0  Down, Depressed, Hopeless 0 1  PHQ - 2 Score 0 1  Altered sleeping 0 -  Tired, decreased energy 1 -    Change in appetite 0 -  Feeling bad or failure about yourself  0 -  Trouble concentrating 0 -  Moving slowly or fidgety/restless 0 -  Suicidal thoughts 0 -  PHQ-9 Score 1 -   Review of Systems  Constitutional: Negative.   HENT: Positive for hearing loss.   Eyes: Negative.   Respiratory: Negative.   Cardiovascular: Negative.   Gastrointestinal: Positive for constipation.  Endocrine: Negative.   Genitourinary: Negative.   Musculoskeletal: Positive for back pain and gait problem.  Skin: Negative.   Neurological: Positive for seizures and weakness.  Psychiatric/Behavioral: Positive for confusion.  All other systems reviewed and are  negative.      Objective:   Physical Exam  Constitutional: No distress . Vital signs reviewed. HENT: Normocephalic.  Atraumatic. Eyes: EOMI. No discharge. Cardiovascular: No JVD.  RRR. Respiratory: Normal effort.  No stridor.  Bilateral clear to auscultation. GI: Non-distended.  BS +. Skin: Warm and dry.  Intact. Psych: Normal mood.  Normal behavior. Musc: No edema in extremities.  No tenderness in extremities. Gait: Slightly slowed cadence Neuro: Alert Very HOH (broken hearing aids) Dysarthria, improving  Motor:  RUE: 4/5 proximal to distal,  RLE: 4+/5 proximal to distal    Assessment & Plan:  LH-male with history of HTN, CAD with chronic systolic CHF, CKD, seizures, cirrhosis of the liver, chronic thrombocytopenia, polysubstance abuse, CAF-on Eliquis, prior TIA/CVA presents for hospital follow up after receiving CIR for Left ACA/MCA watershed infarct, small left basal ganglia and thalamic infarct, left parietal occipital infarct on 03/02/20.  1. Right sided hemiparesis with poor safety awareness and decrease in problem solving affecting mobility and ADLs  secondary to Left ACA/MCA watershed infarct, small left basal ganglia and thalamic infarct, left parietal occipital infarct on 03/02/20.  Follow up with Neuro - needs appointment, info  provided  2. HTN:    Cont meds  Controlled today  3. Chronic systolic CHF:   Cont weight checks  4. Cirrhosis of the liver with chronic thrombocytopenia:    Cont follow up with GI  5. Polysubstance abuse - Cocaine and THC on admit   States he is abstaining, encouraged to continue   6. Seizure disorder:   Continue Keppra   7. CKD stage III:   Cont follow up with Nephro  8. Constipation  Controlled with prune juice  9. Urinary retention/neurogenic bladder             Unclear if taking Tamsulosin  10. Gait abnormality  Cont cane  Patient does not want outpatient therapies at present, will revisit  Meds reviewed Referrals reviewed - needs Neuro All questions answered  >30 minutes spent in total in chart review and discussing with patient given extreme [facility] and needing to revisit and readdress aforementioned issues

## 2020-06-30 ENCOUNTER — Other Ambulatory Visit: Payer: Self-pay | Admitting: *Deleted

## 2020-06-30 NOTE — Patient Outreach (Addendum)
[facility]) Care Management  [facility] Care Manager  06/30/2020   Kevin [name] 10/23/1950 [id number]  Telephone assessment   Referral date: 03/07/20 Referral source: [facility] Discharge Notification  Date of Admission 6/10-6/14/21 Kevin [name], 6/14 Inpatient Rehab Diagnosis : Acute Ischemic Stroke, Right hemiparesis. Date of Discharge: 03/28/20 Facility: [name] Insurance: [employer]  69 year old male with past medical history for PAD, CAD , Hypertension, TIA, Permanent Atrial fib, Hepatitis C.polysubstance abuse, heart failure .   Subjective Unsuccessful outreach attempt to patient/wife home number and patient mobile number, able to leave a HIPAA compliant voicemail message for return call.    Plan If no return call will plan return call in the next month referring to work flow for  follow up.   [name], RN, BSN  [facility] Care Management,Care Management Coordinator  [phone]- Mobile [phone]- Toll Free Main Office

## 2020-07-04 ENCOUNTER — Other Ambulatory Visit: Payer: Self-pay | Admitting: *Deleted

## 2020-07-04 NOTE — Patient Outreach (Signed)
[facility]) Care Management  07/04/2020  GUINN DELAROSA [birth year] [id number]  Care Coordination   After 5pm on 07/03/20, Received voicemail message from patient wife, Cru Kritikos message stating that she has been trying to get in touch with [facility] regarding pickup an hospital bed that they no longer need. Message states she has been trying to get in touch with agency and has been on long hold.  Attempt return call to patient wife on today, no answer able to leave a HIPAA compliant message for return call.   Plan  Will await return call if no response will plan return call in the next 4 business days.    [name], RN, BSN  [facility] Care Management,Care Management Coordinator  [phone]- Mobile [phone]- Toll Free Main Office

## 2020-07-07 ENCOUNTER — Other Ambulatory Visit: Payer: Self-pay | Admitting: *Deleted

## 2020-07-07 NOTE — Patient Outreach (Addendum)
[facility]) Care Management  07/07/2020  Kevin [name] [birth year] [id number]   Care Coordination  2nd call attempt   After 5pm on 07/03/20, Received voicemail message from patient wife, Kevin [name] message stating that she has been trying to get in touch with [facility] regarding pickup an hospital bed that they no longer need. Message states she has been trying to get in touch with agency and has been on long hold.  Attempt return call to patient wife on today,for follow up, no answer able to leave a HIPAA compliant message for return call.   Plan  Will await return call if no response will plan return call in the next 4 business days.   [name], RN, BSN  [facility] Care Management,Care Management Coordinator  [phone]- Mobile [phone]- Toll Free Main Office

## 2020-08-02 ENCOUNTER — Other Ambulatory Visit: Payer: Self-pay | Admitting: *Deleted

## 2020-08-02 NOTE — Patient Outreach (Addendum)
[facility] Care Management  08/02/2020  Kevin [name] 02-29-1951 [id number]     Telephone assessment  Referral date: 03/07/20 Referral source: [facility] Discharge Notification  Date of Admission 6/10-6/14/21 Kevin [name], 6/14 Inpatient Rehab Diagnosis : Acute Ischemic Stroke, Right hemiparesis. Date of Discharge: 03/28/20 Facility: [name] Insurance: [employer]  69 year old male with past medical history for PAD, CAD , Hypertension, TIA, Permanent Atrial fib, Hepatitis C.polysubstance abuse, heart failure .    Subjective Unsuccessful outreach call to patient preferred contact number, no answer able to leave a HIPAA compliant voice mail message for return call.   Multiple unsuccessful outreach attempts , unable to maintain contact with patient/wife    Plan  Will await return call if no response will plan return call, nex month/ follow workflow, unsuccessful outreaches. Addendum Multiple unsuccessful outreach attempt to maintain contact with patient, no response from unsuccessful outreach letter. Will close case. Will send PCP and patient case closure letter  Kevin Garibaldi, RN, BSN  [facility] Care Management,Care Management Coordinator  [phone]- Mobile [phone]- Toll Free Main Office

## 2020-08-29 ENCOUNTER — Encounter: Payer: Medicare HMO | Admitting: Physical Medicine & Rehabilitation

## 2020-09-28 DIAGNOSIS — Z720 Tobacco use: Secondary | ICD-10-CM | POA: Diagnosis not present

## 2020-09-28 DIAGNOSIS — I509 Heart failure, unspecified: Secondary | ICD-10-CM | POA: Diagnosis not present

## 2020-09-28 DIAGNOSIS — E785 Hyperlipidemia, unspecified: Secondary | ICD-10-CM | POA: Diagnosis not present

## 2020-09-28 DIAGNOSIS — I480 Paroxysmal atrial fibrillation: Secondary | ICD-10-CM | POA: Diagnosis not present

## 2020-09-28 DIAGNOSIS — I2510 Atherosclerotic heart disease of native coronary artery without angina pectoris: Secondary | ICD-10-CM | POA: Diagnosis not present

## 2020-09-28 DIAGNOSIS — J410 Simple chronic bronchitis: Secondary | ICD-10-CM | POA: Diagnosis not present

## 2020-09-28 DIAGNOSIS — N182 Chronic kidney disease, stage 2 (mild): Secondary | ICD-10-CM | POA: Diagnosis not present

## 2020-09-28 DIAGNOSIS — I10 Essential (primary) hypertension: Secondary | ICD-10-CM | POA: Diagnosis not present

## 2020-10-03 ENCOUNTER — Other Ambulatory Visit: Payer: Self-pay | Admitting: Physical Medicine and Rehabilitation

## 2020-10-06 ENCOUNTER — Telehealth (HOSPITAL_COMMUNITY): Payer: Self-pay | Admitting: Internal Medicine

## 2020-10-06 MED ORDER — APIXABAN 5 MG PO TABS: 5.0000 mg | ORAL_TABLET | Freq: Two times a day (BID) | ORAL | 2 refills | Status: DC

## 2020-10-06 NOTE — Telephone Encounter (Signed)
rx sent in, pt's wife aware

## 2020-10-06 NOTE — Telephone Encounter (Signed)
Pt request Eliquis refill, [facility], pt scheduled appt first available in march, pt can be reached @[phone] .  Pt is out of meds.  Thanks

## 2020-10-16 DIAGNOSIS — K7460 Unspecified cirrhosis of liver: Secondary | ICD-10-CM | POA: Diagnosis not present

## 2020-10-16 DIAGNOSIS — N1831 Chronic kidney disease, stage 3a: Secondary | ICD-10-CM | POA: Diagnosis not present

## 2020-10-16 DIAGNOSIS — I639 Cerebral infarction, unspecified: Secondary | ICD-10-CM | POA: Diagnosis not present

## 2020-10-16 DIAGNOSIS — I129 Hypertensive chronic kidney disease with stage 1 through stage 4 chronic kidney disease, or unspecified chronic kidney disease: Secondary | ICD-10-CM | POA: Diagnosis not present

## 2020-10-16 DIAGNOSIS — E785 Hyperlipidemia, unspecified: Secondary | ICD-10-CM | POA: Diagnosis not present

## 2020-10-16 DIAGNOSIS — B1920 Unspecified viral hepatitis C without hepatic coma: Secondary | ICD-10-CM | POA: Diagnosis not present

## 2020-10-16 DIAGNOSIS — I509 Heart failure, unspecified: Secondary | ICD-10-CM | POA: Diagnosis not present

## 2020-10-16 DIAGNOSIS — I739 Peripheral vascular disease, unspecified: Secondary | ICD-10-CM | POA: Diagnosis not present

## 2020-10-16 DIAGNOSIS — E1122 Type 2 diabetes mellitus with diabetic chronic kidney disease: Secondary | ICD-10-CM | POA: Diagnosis not present

## 2020-11-10 DIAGNOSIS — N1831 Chronic kidney disease, stage 3a: Secondary | ICD-10-CM | POA: Diagnosis not present

## 2020-11-10 DIAGNOSIS — N182 Chronic kidney disease, stage 2 (mild): Secondary | ICD-10-CM | POA: Diagnosis not present

## 2020-11-20 DIAGNOSIS — I480 Paroxysmal atrial fibrillation: Secondary | ICD-10-CM | POA: Diagnosis not present

## 2020-11-20 DIAGNOSIS — Z1389 Encounter for screening for other disorder: Secondary | ICD-10-CM | POA: Diagnosis not present

## 2020-11-20 DIAGNOSIS — R7303 Prediabetes: Secondary | ICD-10-CM | POA: Diagnosis not present

## 2020-11-20 DIAGNOSIS — J410 Simple chronic bronchitis: Secondary | ICD-10-CM | POA: Diagnosis not present

## 2020-11-20 DIAGNOSIS — Z720 Tobacco use: Secondary | ICD-10-CM | POA: Diagnosis not present

## 2020-11-20 DIAGNOSIS — I10 Essential (primary) hypertension: Secondary | ICD-10-CM | POA: Diagnosis not present

## 2020-11-20 DIAGNOSIS — I509 Heart failure, unspecified: Secondary | ICD-10-CM | POA: Diagnosis not present

## 2020-11-20 DIAGNOSIS — Z0000 Encounter for general adult medical examination without abnormal findings: Secondary | ICD-10-CM | POA: Diagnosis not present

## 2020-11-20 DIAGNOSIS — Z131 Encounter for screening for diabetes mellitus: Secondary | ICD-10-CM | POA: Diagnosis not present

## 2020-11-20 DIAGNOSIS — I2510 Atherosclerotic heart disease of native coronary artery without angina pectoris: Secondary | ICD-10-CM | POA: Diagnosis not present

## 2020-12-03 NOTE — Progress Notes (Signed)
Patient did not show for visit. Note left for templating purposes only.      Advanced Heart Failure Clinic Note  Date:  12/03/2020   ID:  Kevin [name], DOB 03/08/1951, MRN [id number]  Location: Home  Provider location: [place] [facility] Type of Visit: Established patient  PCP:  Norm Salt, PA  Cardiologist:  No primary care provider on file. Primary HF: Kevin [name]  Chief Complaint: Heart Failure follow-up   History of Present Illness:  Kevin [name] is a 70 y/o  male with  PAD, CAD, severe HTN, polysubstance abuse (cocaine and ETOH), tobacco use, persistent Afib (on Eliquis), TIA, Hepatitis C and NICM with recovered EF.   We have not seen him since 4/21   Cath in 1/16 for abnormal Myoview. Found 90% tubular mid-ramus stenosis and 70% bifurcation PDA/PLB stenosis. Treated medically.  Admitted for possible CVA/TIA in 5/17 Cardiology was consulted. EKG when patient was admitted showed Afib RVR with rate of 170. Echo EF 35-40% with LVH (I looked at echo and feel EF more like 45-50% with AF with RVR). No cath performed.    Echo 6/21 EF 60-65%  Here for f/u. We have not seen him in 1 year.   CARDIAC CATHETERIZATION 09/27/14   Left Main: Long vessel, angiographically normal, trifurcates into LAD, LCX and Ramus branches  Left Anterior Descending (LAD): There is a proximal 20-30% eccentric, hazy plaque and mild mid-LAD stenosis, vessel reaches the apex  1st diagonal (D1): Smaller branch, mild diffuse disease  Circumflex (LCx): AV groove vessel, gives off 2 OM branches, mild ostial stenosis.  1st obtuse marginal: Smaller vessel, no stenosis.  2nd obtuse marginal: Minimal luminal irregularities  Ramus Intermedius: There is a tubular 90% mid-Ramus stenosis, the vessel coarses to the lateral wall  Right Coronary Artery: Dominant artery. Mild luminal irregularities.  right ventricle branch of right coronary artery: No  stenosis.  posterior descending artery: There is a 70% ostial bifurcation stenosis, however, this is <2.0 mm in diameter  posterior lateral branch: Ostial stenosis (see above)   Kevin [name] denies symptoms worrisome for COVID 19.   Past Medical History:  Diagnosis Date   Atrial fibrillation, permanent (HCC) 02/01/2016   Blood transfusion without reported diagnosis    CAD in native artery cath 2016 90% mRamus  02/01/2016   CHF (congestive heart failure) (HCC)    Cholelithiases 05/01/2012   Claudication (HCC)    bilateral   Cocaine abuse (HCC)    Dysrhythmia    afib   ETOH abuse    Hepatitis C antibody test positive 04/2012   Hyperpigmentation    Hypertension    PAD (peripheral artery disease) (HCC)    Persistent atrial fibrillation (HCC) 08/23/2014   Thrombocytopenia (HCC)    Tobacco abuse    Past Surgical History:  Procedure Laterality Date   Cardiolite     negative for ischemia   CARDIOVASCULAR STRESS TEST     CARDIOVERSION  05/05/2012   Procedure: CARDIOVERSION;  Surgeon: Thurmon Fair, MD;  Location: [facility] ENDOSCOPY;  Service: Cardiovascular;  Laterality: N/A;   CAROTID ARTERY ANGIOPLASTY     DOPPLER ECHOCARDIOGRAPHY     ENDOVASCULAR STENT INSERTION  right leg   LEFT HEART CATHETERIZATION WITH CORONARY ANGIOGRAM N/A 05/01/2012   Procedure: LEFT HEART CATHETERIZATION WITH CORONARY ANGIOGRAM;  Surgeon: Marykay Lex, MD;  Location: [facility] CATH LAB;  Service: Cardiovascular;  Laterality: N/A;   LEFT HEART CATHETERIZATION WITH CORONARY ANGIOGRAM N/A 09/27/2014  Procedure: LEFT HEART CATHETERIZATION WITH CORONARY ANGIOGRAM;  Surgeon: Chrystie Nose, MD;  Location: [facility] CATH LAB;  Service: Cardiovascular;  Laterality: N/A;   TEE WITHOUT CARDIOVERSION  05/05/2012   Procedure: TRANSESOPHAGEAL ECHOCARDIOGRAM (TEE);  Surgeon: Thurmon Fair, MD;  Location: [facility] ENDOSCOPY;  Service: Cardiovascular;  Laterality: N/A;     Current Outpatient Medications   Medication Sig Dispense Refill   albuterol (VENTOLIN HFA) 108 (90 Base) MCG/ACT inhaler Inhale 2 puffs into the lungs every 6 (six) hours as needed for wheezing or shortness of breath. 18 g 5   apixaban (ELIQUIS) 5 MG TABS tablet Take 1 tablet (5 mg total) by mouth 2 (two) times daily. 60 tablet 2   atorvastatin (LIPITOR) 40 MG tablet Take 1 tablet (40 mg total) by mouth daily at 6 PM. 30 tablet 0   bethanechol (URECHOLINE) 25 MG tablet Take 1 tablet (25 mg total) by mouth 4 (four) times daily. 90 tablet 0   carvedilol (COREG) 12.5 MG tablet Take 1 tablet (12.5 mg total) by mouth 2 (two) times daily with a meal. 60 tablet 0   folic acid (FOLVITE) 1 MG tablet Take 1 tablet (1 mg total) by mouth daily. Need appointment before anymore refills 30 tablet 1   furosemide (LASIX) 20 MG tablet Take 1 tablet (20 mg total) by mouth daily. 30 tablet 0   levETIRAcetam (KEPPRA) 1000 MG tablet Take 1 tablet (1,000 mg total) by mouth 2 (two) times daily. 60 tablet 0   Multiple Vitamin (MULTIVITAMIN WITH MINERALS) TABS tablet Take 1 tablet by mouth daily. Men's One a Day     rosuvastatin (CRESTOR) 20 MG tablet      sacubitril-valsartan (ENTRESTO) 97-103 MG Take 1 tablet by mouth 2 (two) times daily. 60 tablet 0   spironolactone (ALDACTONE) 25 MG tablet Take 0.5 tablets (12.5 mg total) by mouth daily. 30 tablet 0   tamsulosin (FLOMAX) 0.4 MG CAPS capsule Take 2 capsules (0.8 mg total) by mouth daily after supper. 60 capsule 0   TRELEGY ELLIPTA 100-62.5-25 MCG/INH AEPB Take 1 puff by mouth daily.     No current facility-administered medications for this encounter.    Allergies:   Patient has no known allergies.   Social History:  The patient  reports that he has quit smoking. His smoking use included cigarettes. He has a 10.00 pack-year smoking history. He has never used smokeless tobacco. He reports previous alcohol use. He reports that he does not use drugs.   Family History:  The patient's  family history includes Cancer in his mother; Seizures in his daughter.   ROS:  Please see the history of present illness.   All other systems are personally reviewed and negative.   There were no vitals filed for this visit.  Exam:   General:  Well appearing. No resp difficulty HEENT: normal Neck: supple. no JVD. Carotids 2+ bilat; no bruits. No lymphadenopathy or thryomegaly appreciated. Cor: PMI nondisplaced. Irregular rate & rhythm. No rubs, gallops or murmurs. Lungs: clear Abdomen: soft, nontender, nondistended. No hepatosplenomegaly. No bruits or masses. Good bowel sounds. Extremities: no cyanosis, clubbing, rash, edema Neuro: alert & orientedx3, cranial nerves grossly intact. moves all 4 extremities w/o difficulty. Affect pleasant   Recent Labs: 01/14/2020: B Natriuretic Peptide 857.3 03/07/2020: ALT 24 03/27/2020: BUN 15; Creatinine, Ser 1.28; Hemoglobin 13.6; Platelets 71; Potassium 3.8; Sodium 137  Personally reviewed   Wt Readings from Last 3 Encounters:  06/29/20 102.3 kg (225 lb 9.6 oz)  03/28/20 99.6 kg (219  lb 9.3 oz)  03/06/20 102.2 kg (225 lb 5 oz)    ECG: AF 106 RBBB Personally reviewed  ASSESSMENT AND PLAN:  1. Chronic systolic HF duet o NICM with recovered EF - EF 35-40% by echo 6/17 - Echo 6/21 EF 60-65% - Stable NYHA III. Volume status stable. - Continue current regimen with Entresto, carvedilol, sprio and lasix   2. Chronic AF - Rate slightly elevated. - Continue Eliquis. No bleeding  3. CAD  - Stable. No s/s ischemia - Continue statin. No ASA with Eliquis   4. Severe HTN - Blood pressure with improved control but DBP still high.   5. Polysubstance abuse   - reports cessation  6. Tobacco use  - back smoking a few cigs per day. Encouraged cessation.   7. CKD stage IIIa - check labs today  8. PAD - Follows with Dr. Kendrick Ranch, MD  12/03/2020 10:48 PM  Advanced Heart Failure Clinic [facility] [address] [facility] [place] [address] [phone] (office) [phone] (fax)

## 2020-12-04 ENCOUNTER — Inpatient Hospital Stay (HOSPITAL_COMMUNITY)
Admission: RE | Admit: 2020-12-04 | Discharge: 2020-12-04 | Disposition: A | Discharge status: Home / Self Care | Payer: Medicaid Other | Source: Ambulatory Visit | Attending: Internal Medicine | Admitting: Internal Medicine

## 2020-12-04 DIAGNOSIS — I5032 Chronic diastolic (congestive) heart failure: Secondary | ICD-10-CM

## 2020-12-18 ENCOUNTER — Other Ambulatory Visit (HOSPITAL_COMMUNITY): Payer: Self-pay | Admitting: Internal Medicine

## 2021-01-15 ENCOUNTER — Telehealth (HOSPITAL_COMMUNITY): Payer: Self-pay | Admitting: Internal Medicine

## 2021-01-15 MED ORDER — ENTRESTO 97-103 MG PO TABS: 1.0000 | ORAL_TABLET | Freq: Two times a day (BID) | ORAL | 3 refills | Status: DC

## 2021-01-15 NOTE — Telephone Encounter (Signed)
Pt request Entresto 97-103 MG [facility], pt can be reached @ [phone], Appt scheduled in May W/APP clinic

## 2021-01-28 ENCOUNTER — Other Ambulatory Visit (HOSPITAL_COMMUNITY): Payer: Self-pay | Admitting: Internal Medicine

## 2021-02-14 ENCOUNTER — Other Ambulatory Visit: Payer: Self-pay

## 2021-02-14 ENCOUNTER — Ambulatory Visit (HOSPITAL_COMMUNITY)
Admission: RE | Admit: 2021-02-14 | Discharge: 2021-02-14 | Disposition: A | Discharge status: Home / Self Care | Payer: Medicare HMO | Source: Ambulatory Visit | Attending: Cardiology | Admitting: Cardiology

## 2021-02-14 ENCOUNTER — Encounter (HOSPITAL_COMMUNITY): Payer: Self-pay

## 2021-02-14 VITALS — BP 92/70 | HR 74 | Wt 226.4 lb

## 2021-02-14 DIAGNOSIS — Z87891 Personal history of nicotine dependence: Secondary | ICD-10-CM | POA: Diagnosis not present

## 2021-02-14 DIAGNOSIS — Z7901 Long term (current) use of anticoagulants: Secondary | ICD-10-CM | POA: Insufficient documentation

## 2021-02-14 DIAGNOSIS — Z7984 Long term (current) use of oral hypoglycemic drugs: Secondary | ICD-10-CM | POA: Diagnosis not present

## 2021-02-14 DIAGNOSIS — R0609 Other forms of dyspnea: Secondary | ICD-10-CM | POA: Insufficient documentation

## 2021-02-14 DIAGNOSIS — I2510 Atherosclerotic heart disease of native coronary artery without angina pectoris: Secondary | ICD-10-CM | POA: Diagnosis not present

## 2021-02-14 DIAGNOSIS — I739 Peripheral vascular disease, unspecified: Secondary | ICD-10-CM | POA: Diagnosis not present

## 2021-02-14 DIAGNOSIS — I428 Other cardiomyopathies: Secondary | ICD-10-CM | POA: Insufficient documentation

## 2021-02-14 DIAGNOSIS — R531 Weakness: Secondary | ICD-10-CM | POA: Insufficient documentation

## 2021-02-14 DIAGNOSIS — I5032 Chronic diastolic (congestive) heart failure: Secondary | ICD-10-CM | POA: Diagnosis not present

## 2021-02-14 DIAGNOSIS — F1410 Cocaine abuse, uncomplicated: Secondary | ICD-10-CM | POA: Diagnosis not present

## 2021-02-14 DIAGNOSIS — I5022 Chronic systolic (congestive) heart failure: Secondary | ICD-10-CM | POA: Diagnosis not present

## 2021-02-14 DIAGNOSIS — N1831 Chronic kidney disease, stage 3a: Secondary | ICD-10-CM | POA: Diagnosis not present

## 2021-02-14 DIAGNOSIS — Z79899 Other long term (current) drug therapy: Secondary | ICD-10-CM | POA: Insufficient documentation

## 2021-02-14 DIAGNOSIS — I130 Hypertensive heart and chronic kidney disease with heart failure and stage 1 through stage 4 chronic kidney disease, or unspecified chronic kidney disease: Secondary | ICD-10-CM | POA: Insufficient documentation

## 2021-02-14 DIAGNOSIS — Z8673 Personal history of transient ischemic attack (TIA), and cerebral infarction without residual deficits: Secondary | ICD-10-CM | POA: Insufficient documentation

## 2021-02-14 DIAGNOSIS — R140 Abdominal distension (gaseous): Secondary | ICD-10-CM | POA: Insufficient documentation

## 2021-02-14 LAB — CBC
HCT: 44 % (ref 39.0–52.0)
Hemoglobin: 14.9 g/dL (ref 13.0–17.0)
MCH: 33.3 pg (ref 26.0–34.0)
MCHC: 33.9 g/dL (ref 30.0–36.0)
MCV: 98.4 fL (ref 80.0–100.0)
Platelets: 73 10*3/uL — ABNORMAL LOW (ref 150–400)
RBC: 4.47 MIL/uL (ref 4.22–5.81)
RDW: 11.8 % (ref 11.5–15.5)
WBC: 6.3 10*3/uL (ref 4.0–10.5)
nRBC: 0 % (ref 0.0–0.2)

## 2021-02-14 LAB — BASIC METABOLIC PANEL
Anion gap: 5 (ref 5–15)
BUN: 19 mg/dL (ref 8–23)
CO2: 24 mmol/L (ref 22–32)
Calcium: 8.9 mg/dL (ref 8.9–10.3)
Chloride: 107 mmol/L (ref 98–111)
Creatinine, Ser: 1.58 mg/dL — ABNORMAL HIGH (ref 0.61–1.24)
GFR, Estimated: 47 mL/min — ABNORMAL LOW (ref >60)
Glucose, Bld: 112 mg/dL — ABNORMAL HIGH (ref 70–99)
Potassium: 4.2 mmol/L (ref 3.5–5.1)
Sodium: 136 mmol/L (ref 135–145)

## 2021-02-14 MED ORDER — EMPAGLIFLOZIN 10 MG PO TABS: 10.0000 mg | ORAL_TABLET | Freq: Every day | ORAL | 11 refills | Status: DC

## 2021-02-14 NOTE — Progress Notes (Signed)
Advanced Heart Failure Clinic Note  Date:  02/14/2021   ID:  DEMETRIES COIA, DOB 09/04/1951, MRN [id number]  Location: Home  Provider location: [facility] Type of Visit: Established patient  PCP:  Norm Salt, PA  Cardiologist:  None Primary HF: Bensimhon  Chief Complaint: Heart Failure follow-up   History of Present Illness:  Mr. Hyland is a 70 y/o  male with  PAD, CAD, severe HTN, polysubstance abuse (cocaine and ETOH), tobacco use, persistent Afib (on Eliquis), TIA, Hepatitis C and NICM with recovered EF.   Cath in 1/16 for abnormal Myoview. Found 90% tubular mid-ramus stenosis and 70% bifurcation PDA/PLB stenosis. Treated medically.  Admitted for possible CVA/TIA in 5/17 Cardiology was consulted. EKG when patient was admitted showed Afib RVR with rate of 170. Echo EF 35-40% with LVH (I looked at echo and feel EF more like 45-50% with AF with RVR). No cath performed.    Echo 6/21 EF 60-65%  Admitted again 7/21 for acute CVA.  MRI brain done revealing numerous acute cortical/subcortical embolic infarcts in L-ACA, MCA, PCA and watershed territories as well as multiple acute infarcts in left basal ganglia and left thalamus. UDS positive for cocaine and THC. Neurology recommended continuing Eliquis as well as risk factor modification. Required extensive therapy w/ CIR and eventual discharge to SNF.   He was lost to f/u for a period of time, not seen since 4/21. Presents today for f/u. Here w/ his wife. Ambulating w/ cane. Continues w/ mild residual rt sided LE weakness. Reports compliance w/ Eliquis. No abnormal bleeding. No CP but reports exertional dyspnea. NYHA Class III. No LEE, orthopnea/ PND but notes abdominal distention and bendopnea. Not checking wt regularly at home. BP is soft but no orthostatic symptoms. Last hgb A1c from 6/21 was 5.2.  Denies any further drug use.     CARDIAC CATHETERIZATION 09/27/14   Left Main: Long  vessel, angiographically normal, trifurcates into LAD, LCX and Ramus branches  Left Anterior Descending (LAD): There is a proximal 20-30% eccentric, hazy plaque and mild mid-LAD stenosis, vessel reaches the apex  1st diagonal (D1): Smaller branch, mild diffuse disease  Circumflex (LCx): AV groove vessel, gives off 2 OM branches, mild ostial stenosis.  1st obtuse marginal: Smaller vessel, no stenosis.  2nd obtuse marginal: Minimal luminal irregularities  Ramus Intermedius: There is a tubular 90% mid-Ramus stenosis, the vessel coarses to the lateral wall  Right Coronary Artery: Dominant artery. Mild luminal irregularities.  right ventricle branch of right coronary artery: No stenosis.  posterior descending artery: There is a 70% ostial bifurcation stenosis, however, this is <2.0 mm in diameter  posterior lateral branch: Ostial stenosis (see above)  Most Recent Echo 02/2020 LVEF 60-65%. RV ok     Past Medical History:  Diagnosis Date  . Atrial fibrillation, permanent (HCC) 02/01/2016  . Blood transfusion without reported diagnosis   . CAD in native artery cath 2016 90% mRamus  02/01/2016  . CHF (congestive heart failure) (HCC)   . Cholelithiases 05/01/2012  . Claudication (HCC)    bilateral  . Cocaine abuse (HCC)   . Dysrhythmia    afib  . ETOH abuse   . Hepatitis C antibody test positive 04/2012  . Hyperpigmentation   . Hypertension   . PAD (peripheral artery disease) (HCC)   . Persistent atrial fibrillation (HCC) 08/23/2014  . Thrombocytopenia (HCC)   . Tobacco abuse    Past Surgical History:  Procedure Laterality  Date  . Cardiolite     negative for ischemia  . CARDIOVASCULAR STRESS TEST    . CARDIOVERSION  05/05/2012   Procedure: CARDIOVERSION;  Surgeon: Thurmon Fair, MD;  Location: [facility] ENDOSCOPY;  Service: Cardiovascular;  Laterality: N/A;  . CAROTID ARTERY ANGIOPLASTY    . DOPPLER ECHOCARDIOGRAPHY    . ENDOVASCULAR STENT INSERTION  right leg  . LEFT HEART  CATHETERIZATION WITH CORONARY ANGIOGRAM N/A 05/01/2012   Procedure: LEFT HEART CATHETERIZATION WITH CORONARY ANGIOGRAM;  Surgeon: Marykay Lex, MD;  Location: [facility] CATH LAB;  Service: Cardiovascular;  Laterality: N/A;  . LEFT HEART CATHETERIZATION WITH CORONARY ANGIOGRAM N/A 09/27/2014   Procedure: LEFT HEART CATHETERIZATION WITH CORONARY ANGIOGRAM;  Surgeon: Chrystie Nose, MD;  Location: [facility] CATH LAB;  Service: Cardiovascular;  Laterality: N/A;  . TEE WITHOUT CARDIOVERSION  05/05/2012   Procedure: TRANSESOPHAGEAL ECHOCARDIOGRAM (TEE);  Surgeon: Thurmon Fair, MD;  Location: [facility] ENDOSCOPY;  Service: Cardiovascular;  Laterality: N/A;     Current Outpatient Medications  Medication Sig Dispense Refill  . albuterol (VENTOLIN HFA) 108 (90 Base) MCG/ACT inhaler Inhale 2 puffs into the lungs every 6 (six) hours as needed for wheezing or shortness of breath. 18 g 5  . amLODipine (NORVASC) 5 MG tablet Take 5 mg by mouth daily.    . carvedilol (COREG) 12.5 MG tablet Take 1 tablet (12.5 mg total) by mouth 2 (two) times daily with a meal. 60 tablet 0  . ELIQUIS 5 MG TABS tablet TAKE 1 TABLET BY MOUTH TWICE A DAY 60 tablet 11  . Multiple Vitamin (MULTIVITAMIN WITH MINERALS) TABS tablet Take 1 tablet by mouth daily. Men's One a Day    . rosuvastatin (CRESTOR) 20 MG tablet     . sacubitril-valsartan (ENTRESTO) 97-103 MG Take 1 tablet by mouth 2 (two) times daily. 60 tablet 3  . spironolactone (ALDACTONE) 25 MG tablet TAKE 1/2 TABLET BY MOUTH EVERY DAY 45 tablet 0  . TRELEGY ELLIPTA 100-62.5-25 MCG/INH AEPB Take 1 puff by mouth daily.     No current facility-administered medications for this encounter.    Allergies:   Patient has no known allergies.   Social History:  The patient  reports that he has quit smoking. His smoking use included cigarettes. He has a 10.00 pack-year smoking history. He has never used smokeless tobacco. He reports previous alcohol use. He reports that he does not use drugs.    Family History:  The patient's family history includes Cancer in his mother; Seizures in his daughter.   ROS:  Please see the history of present illness.   All other systems are personally reviewed and negative.   Vitals:   02/14/21 1349  BP: 92/70  Pulse: 74  SpO2: 98%  Weight: 102.7 kg (226 lb 6.4 oz)    PHYSICAL EXAM: General:  Well appearing, ambulating w/ cane. No respiratory difficulty HEENT: normal Neck: supple. JVD 8 cm. Carotids 2+ bilat; no bruits. No lymphadenopathy or thyromegaly appreciated. Cor: PMI nondisplaced. Regular rate & rhythm. No rubs, gallops or murmurs. Lungs: clear Abdomen: obese, soft, nontender, nondistended. No hepatosplenomegaly. No bruits or masses. Good bowel sounds. Extremities: no cyanosis, clubbing, rash, edema Neuro: alert & oriented x 3, cranial nerves grossly intact. moves all 4 extremities w/o difficulty. Affect pleasant.    Recent Labs: 03/07/2020: ALT 24 03/27/2020: BUN 15; Creatinine, Ser 1.28; Hemoglobin 13.6; Platelets 71; Potassium 3.8; Sodium 137  Personally reviewed   Wt Readings from Last 3 Encounters:  02/14/21 102.7 kg (226 lb 6.4 oz)  06/29/20 102.3 kg (225 lb 9.6 oz)  03/28/20 99.6 kg (219 lb 9.3 oz)    ECG: AF 106 RBBB Personally reviewed  ASSESSMENT AND PLAN:  1. Chronic systolic HF duet o NICM with recovered EF - EF 35-40% by echo 6/17 - Echo 6/21 EF 60-65% - NYHA Class III. Mildly fluid overloaded on exam  - Continue Entresto 97-103 bid - Add Jardiance 10 mg daily.  - Continue Spiro 12.5 mg daily  - Continue Coreg 12.5 mg bid. BP too soft for titration - Check BMP today and again in 7 days - discussed daily wts, low sodium diet and fluid restriction  - Repeat echo    2. Chronic AF - Rate controlled w/  blocker - Continue Eliquis.  Denies bleeding. Check CBC today   3. CAD  - Stable. No CP  - Continue statin. No ASA with Eliquis use    4. Severe HTN - Controlled on current regimen   5. Polysubstance  abuse   - reports cessation  6. Tobacco use  - Reports he quit    7. CKD stage IIIa - Baseline SCr ~1.4  - BMP today and in 7 days  8. PAD - Follows with Dr. Kirke Corin - denies claudication   9. CVA -  acute CVA 7/21.  MRI brain showed numerous acute cortical/subcortical embolic infarcts in L-ACA, MCA, PCA and watershed territories as well as multiple acute infarcts in left basal ganglia and left thalamus.  - Continue Eliquis    F/u w/ APP in 4-6 weeks   Robbie Lis, PA-C  02/14/2021 2:04 PM  Advanced Heart Failure Clinic [facility] [address] [facility] [place] [address] [phone] (office) [phone] (fax)

## 2021-02-14 NOTE — Patient Instructions (Signed)
START Jardiance 10 mg, one tab daily   Labs today We will only contact you if something comes back abnormal or we need to make some changes. Otherwise no news is good news!  Labs needed in 7-10 days  Your physician has requested that you have an echocardiogram. Echocardiography is a painless test that uses sound waves to create images of your heart. It provides your doctor with information about the size and shape of your heart and how well your heart's chambers and valves are working. This procedure takes approximately one hour. There are no restrictions for this procedure.   Your physician recommends that you schedule a follow-up appointment in: 4-6 week  in the Advanced Practitioners (PA/NP) Clinic    Do the following things EVERYDAY: 1) Weigh yourself in the morning before breakfast. Write it down and keep it in a log. 2) Take your medicines as prescribed 3) Eat low salt foods--Limit salt (sodium) to 2000 mg per day.  4) Stay as active as you can everyday 5) Limit all fluids for the day to less than 2 liters  At the Advanced Heart Failure Clinic, you and your health needs are our priority. As part of our continuing mission to provide you with exceptional heart care, we have created designated Provider Care Teams. These Care Teams include your primary Cardiologist (physician) and Advanced Practice Providers (APPs- Physician Assistants and Nurse Practitioners) who all work together to provide you with the care you need, when you need it.   You may see any of the following providers on your designated Care Team at your next follow up: Marland Kitchen Dr Arvilla Meres . Dr Marca Ancona . Dr Thornell Mule . Tonye Becket, NP . Robbie Lis, PA . Shanda Bumps [place],NP . Karle Plumber, PharmD   Please be sure to bring in all your medications bottles to every appointment.    If you have any questions or concerns before your next appointment please send [place] a message through [place] or call our  office at [phone].    TO LEAVE A MESSAGE FOR THE NURSE SELECT OPTION 2, PLEASE LEAVE A MESSAGE INCLUDING: . YOUR NAME . DATE OF BIRTH . CALL BACK NUMBER . REASON FOR CALL**this is important as we prioritize the call backs  YOU WILL RECEIVE A CALL BACK THE SAME DAY AS LONG AS YOU CALL BEFORE 4:00 PM

## 2021-02-15 LAB — HEMOGLOBIN A1C
Hgb A1c MFr Bld: 5.8 % — ABNORMAL HIGH (ref 4.8–5.6)
Mean Plasma Glucose: 120 mg/dL

## 2021-02-22 ENCOUNTER — Other Ambulatory Visit (HOSPITAL_COMMUNITY): Payer: Medicaid Other

## 2021-03-07 ENCOUNTER — Other Ambulatory Visit: Payer: Self-pay

## 2021-03-07 NOTE — Patient Outreach (Signed)
[facility]) Care Management  03/07/2021  Kevin [name] 03/09/1951 [id number]   Telephone Screen  Referral Date: 03/06/2021 Referral Source: "Join EMMI Campaign" Referral Reason: "wants more info about [name]"   Outreach attempt #1 to patient. Spoke with spouse(DPR on file) who requested call be completed with her. She reports that she received automated EMMI call . She states that she pused the "decline button" as she was not interested in services or getting more info. Spouse states she has [name] info and will call in the future if needed but did not want patient to enroll in services or want any follow up calls at this time.       Plan: RN CM will close case at this time.    Antionette Fairy, RN,BSN,CCM [facility] Care Management Telephonic Care Management Coordinator Direct Phone: [phone] Toll Free: [phone] Fax: [phone]

## 2021-03-25 ENCOUNTER — Other Ambulatory Visit (HOSPITAL_COMMUNITY): Payer: Self-pay | Admitting: Internal Medicine

## 2021-03-28 ENCOUNTER — Encounter (HOSPITAL_COMMUNITY): Payer: Medicaid Other

## 2021-03-28 ENCOUNTER — Ambulatory Visit (HOSPITAL_COMMUNITY): Admission: RE | Admit: 2021-03-28 | Payer: Medicare HMO | Source: Ambulatory Visit

## 2021-05-05 ENCOUNTER — Other Ambulatory Visit (HOSPITAL_COMMUNITY): Payer: Self-pay | Admitting: Internal Medicine

## 2021-05-22 ENCOUNTER — Other Ambulatory Visit: Payer: Self-pay

## 2021-05-22 NOTE — Telephone Encounter (Signed)
This is a CHF pt 

## 2021-05-24 MED ORDER — CARVEDILOL 12.5 MG PO TABS: 12.5000 mg | ORAL_TABLET | Freq: Two times a day (BID) | ORAL | 3 refills | Status: DC

## 2021-05-31 ENCOUNTER — Other Ambulatory Visit (HOSPITAL_COMMUNITY): Payer: Self-pay | Admitting: Internal Medicine

## 2021-08-19 ENCOUNTER — Other Ambulatory Visit: Payer: Self-pay | Admitting: Internal Medicine

## 2021-09-14 ENCOUNTER — Telehealth (HOSPITAL_COMMUNITY): Payer: Self-pay

## 2021-09-14 NOTE — Telephone Encounter (Signed)
Called and left a message to confirm/remind patient of their appointment at the Advanced Heart Failure Clinic on 09/18/21.

## 2021-09-14 NOTE — Progress Notes (Addendum)
Advanced Heart Failure Clinic Note  Date:  09/18/2021   ID:  SHU FAZZINO, DOB 1951/07/24, MRN [id number]  Location: Home  Provider location: [facility] Type of Visit: Established patient  PCP:  Trey Sailors, PA  Cardiologist:  None Primary HF: Bensimhon  Chief Complaint: Heart Failure follow-up   History of Present Illness:  Mr. Bauser is a 70 y.o.  male with  PAD, CAD, severe HTN, polysubstance abuse (cocaine and ETOH), tobacco use, persistent Afib (on Eliquis), TIA, Hepatitis C and NICM with recovered EF.   Cath in 1/16 for abnormal Myoview. Found 90% tubular mid-ramus stenosis and 70% bifurcation PDA/PLB stenosis. Treated medically.  Admitted for possible CVA/TIA in 5/17 Cardiology was consulted. EKG on admit showed Afib RVR with rate of 170.  Echo EF 35-40% with LVH (DB looked at echo and felt EF more like 45-50% with AF with RVR). No cath performed.    Echo 6/21 EF 60-65%  Admitted again 7/21 for acute CVA.  MRI brain done revealing numerous acute cortical/subcortical embolic infarcts in L-ACA, MCA, PCA and watershed territories as well as multiple acute infarcts in left basal ganglia and left thalamus. UDS positive for cocaine and THC. Neurology recommended continuing Eliquis as well as risk factor modification. Required extensive therapy w/ CIR and eventual discharge to SNF.   He was lost to f/u for a period of time, not seen since 4/21.   Follow up 5/22, NYHA Class III, and Jardiance added.  Today he returns for HF follow up with his wife. He has some SOB walking on flat ground, wife thinks his mobility is getting worse. He fell a few months ago after his legs gave out. He denies palpitations, abnormal bleeding, CP, dizziness, edema, or PND/Orthopnea. Appetite ok. No fever or chills. Does not weigh at home. Taking all medications. Wife thinks he may have had another TIA as he is having more right hand weakness and numbness. Has  not seen Neurology. No further ETOH/drug use.  Cardiac Studies: - Echo 6/21: EF 60-65%, RV ok.  - LHC 1/16:  Left Main:  Long vessel, angiographically normal, trifurcates into LAD, LCX and Ramus branches Left Anterior Descending (LAD):  There is a proximal 20-30% eccentric, hazy plaque and mild mid-LAD stenosis, vessel reaches the apex 1st diagonal (D1):  Smaller branch, mild diffuse disease Circumflex (LCx):  AV groove vessel, gives off 2 OM branches, mild ostial stenosis. 1st obtuse marginal:  Smaller vessel, no stenosis. 2nd obtuse marginal:  Minimal luminal irregularities Ramus Intermedius:  There is a tubular 90% mid-Ramus stenosis, the vessel coarses to the lateral wall Right Coronary Artery: Dominant artery. Mild luminal irregularities. right ventricle branch of right coronary artery: No stenosis. posterior descending artery: There is a 70% ostial bifurcation stenosis, however, this is <2.0 mm in diameter posterior lateral branch:  Ostial stenosis (see above)  Past Medical History:  Diagnosis Date   Atrial fibrillation, permanent ([place]) 02/01/2016   Blood transfusion without reported diagnosis    CAD in native artery cath 2016 90% mRamus  02/01/2016   CHF (congestive heart failure) ([place])    Cholelithiases 05/01/2012   Claudication ([place])    bilateral   Cocaine abuse ([place])    Dysrhythmia    afib   ETOH abuse    Hepatitis C antibody test positive 04/2012   Hyperpigmentation    Hypertension    PAD (peripheral artery disease) (HCC)    Persistent atrial fibrillation ([place]) 08/23/2014  Thrombocytopenia ([place])    Tobacco abuse    Past Surgical History:  Procedure Laterality Date   Cardiolite     negative for ischemia   CARDIOVASCULAR STRESS TEST     CARDIOVERSION  05/05/2012   Procedure: CARDIOVERSION;  Surgeon: Sanda Klein, MD;  Location: [facility] ENDOSCOPY;  Service: Cardiovascular;  Laterality: N/A;   CAROTID ARTERY ANGIOPLASTY     DOPPLER ECHOCARDIOGRAPHY     ENDOVASCULAR  STENT INSERTION  right leg   LEFT HEART CATHETERIZATION WITH CORONARY ANGIOGRAM N/A 05/01/2012   Procedure: LEFT HEART CATHETERIZATION WITH CORONARY ANGIOGRAM;  Surgeon: Leonie Man, MD;  Location: [facility] CATH LAB;  Service: Cardiovascular;  Laterality: N/A;   LEFT HEART CATHETERIZATION WITH CORONARY ANGIOGRAM N/A 09/27/2014   Procedure: LEFT HEART CATHETERIZATION WITH CORONARY ANGIOGRAM;  Surgeon: Pixie Casino, MD;  Location: [facility] CATH LAB;  Service: Cardiovascular;  Laterality: N/A;   TEE WITHOUT CARDIOVERSION  05/05/2012   Procedure: TRANSESOPHAGEAL ECHOCARDIOGRAM (TEE);  Surgeon: Sanda Klein, MD;  Location: [facility] ENDOSCOPY;  Service: Cardiovascular;  Laterality: N/A;    Current Outpatient Medications  Medication Sig Dispense Refill   albuterol (VENTOLIN HFA) 108 (90 Base) MCG/ACT inhaler Inhale 2 puffs into the lungs every 6 (six) hours as needed for wheezing or shortness of breath. 18 g 5   amLODipine (NORVASC) 5 MG tablet Take 5 mg by mouth daily.     carvedilol (COREG) 12.5 MG tablet Take 1 tablet (12.5 mg total) by mouth 2 (two) times daily with a meal. NEEDS APPOINTMENT FOR FURTHER REFILLS 60 tablet 1   ELIQUIS 5 MG TABS tablet TAKE 1 TABLET BY MOUTH TWICE A DAY 60 tablet 11   empagliflozin (JARDIANCE) 10 MG TABS tablet Take 1 tablet (10 mg total) by mouth daily before breakfast. 30 tablet 11   ENTRESTO 97-103 MG TAKE 1 TABLET BY MOUTH TWICE A DAY 60 tablet 11   Multiple Vitamin (MULTIVITAMIN WITH MINERALS) TABS tablet Take 1 tablet by mouth daily. Men's One a Day     rosuvastatin (CRESTOR) 20 MG tablet Take 20 mg by mouth daily.     spironolactone (ALDACTONE) 25 MG tablet TAKE 1/2 TABLET BY MOUTH EVERY DAY 45 tablet 1   TRELEGY ELLIPTA 100-62.5-25 MCG/INH AEPB Take 1 puff by mouth daily.     No current facility-administered medications for this encounter.    Allergies:   Patient has no known allergies.   Social History:  The patient  reports that he has quit smoking. His smoking use  included cigarettes. He has a 10.00 pack-year smoking history. He has never used smokeless tobacco. He reports that he does not currently use alcohol. He reports that he does not use drugs.   Family History:  The patient's family history includes Cancer in his mother; Seizures in his daughter.   ROS:  Please see the history of present illness.   All other systems are personally reviewed and negative.   Recent Labs: 02/14/2021: BUN 19; Creatinine, Ser 1.58; Hemoglobin 14.9; Platelets 73; Potassium 4.2; Sodium 136  Personally reviewed   Wt Readings from Last 3 Encounters:  09/18/21 97.5 kg (215 lb)  02/14/21 102.7 kg (226 lb 6.4 oz)  06/29/20 102.3 kg (225 lb 9.6 oz)    BP (!) 142/98    Pulse 97    Wt 97.5 kg (215 lb)    SpO2 98%    BMI 29.16 kg/m   General:  NAD. No resp difficulty, ambulating with cane. HEENT: + HOH Neck: Supple. No JVD. Carotids  2+ bilat; no bruits. No lymphadenopathy or thryomegaly appreciated. Cor: PMI nondisplaced. Irregular rate & rhythm. No rubs, gallops or murmurs. Lungs: Clear Abdomen: Soft, nontender, nondistended. No hepatosplenomegaly. No bruits or masses. Good bowel sounds. Extremities: No cyanosis, clubbing, rash, edema Neuro: Alert & oriented x 3, cranial nerves grossly intact. Moves all 4 extremities w/o difficulty. Affect pleasant. Mild dysarthria.  ECG: atrial fibrillation 88 bpm (personally reviewed).  ASSESSMENT AND PLAN: 1. Chronic systolic HF duet o NICM with recovered EF - Echo (6/17): EF 35-40%  - Echo (6/21): EF 60-65% - NYHA Class III, functional class difficult due to right-sided weakness from prior CVA. - Increase carvedilol to 18.75 mg bid. HR 97 today. - Continue spiro 12.5 mg daily. - Continue Entresto 97-103 mg bid. - Continue Jardiance 10 mg daily.  - Discussed daily wts, low sodium diet and fluid restriction.  - Repeat echo. - BMET today.  2. Chronic AF - Increase ? blocker as above. - Continue Eliquis.  Denies bleeding.  -  CBC today.   3. CAD  - Stable. No CP.  - Continue statin. Refill today. - No ASA with Eliquis use.   4. Severe HTN - Mildly elevated today. - Increase carvedilol as above.  5. Polysubstance abuse   - Reports cessation.  6. Tobacco use  - Reports he quit.    7. CKD stage IIIa - Baseline SCr ~1.4  - Labs today.  8. PAD - Follows with Dr. Kirke Corin. - Denies claudication.   9. CVA -  acute CVA 7/21.  MRI brain showed numerous acute cortical/subcortical embolic infarcts in L-ACA, MCA, PCA and watershed territories as well as multiple acute infarcts in left basal ganglia and left thalamus.  - Continue Eliquis.  - Worsening right hand weakness and gait imbalance. - Refer to Neurology. May benefit from Neuro PT.  Follow up with APP in 4 weeks (consider increasing spiro).  Anderson Malta DeWitt, FNP  09/18/2021 3:19 PM  Advanced Heart Failure Clinic [facility] [address] Heart and Vascular [place] [address] [phone] (office) [phone] (fax)

## 2021-09-18 ENCOUNTER — Other Ambulatory Visit: Payer: Self-pay

## 2021-09-18 ENCOUNTER — Encounter (HOSPITAL_COMMUNITY): Payer: Self-pay

## 2021-09-18 ENCOUNTER — Ambulatory Visit (HOSPITAL_COMMUNITY)
Admission: RE | Admit: 2021-09-18 | Discharge: 2021-09-18 | Disposition: A | Discharge status: Home / Self Care | Payer: Medicare HMO | Source: Ambulatory Visit | Attending: Family Medicine | Admitting: Family Medicine

## 2021-09-18 VITALS — BP 142/98 | HR 97 | Wt 215.0 lb

## 2021-09-18 DIAGNOSIS — Z87891 Personal history of nicotine dependence: Secondary | ICD-10-CM | POA: Diagnosis not present

## 2021-09-18 DIAGNOSIS — R531 Weakness: Secondary | ICD-10-CM | POA: Diagnosis not present

## 2021-09-18 DIAGNOSIS — Z7984 Long term (current) use of oral hypoglycemic drugs: Secondary | ICD-10-CM | POA: Insufficient documentation

## 2021-09-18 DIAGNOSIS — R2681 Unsteadiness on feet: Secondary | ICD-10-CM | POA: Insufficient documentation

## 2021-09-18 DIAGNOSIS — Z79899 Other long term (current) drug therapy: Secondary | ICD-10-CM | POA: Insufficient documentation

## 2021-09-18 DIAGNOSIS — R29818 Other symptoms and signs involving the nervous system: Secondary | ICD-10-CM

## 2021-09-18 DIAGNOSIS — I4821 Permanent atrial fibrillation: Secondary | ICD-10-CM | POA: Diagnosis not present

## 2021-09-18 DIAGNOSIS — I2510 Atherosclerotic heart disease of native coronary artery without angina pectoris: Secondary | ICD-10-CM

## 2021-09-18 DIAGNOSIS — I130 Hypertensive heart and chronic kidney disease with heart failure and stage 1 through stage 4 chronic kidney disease, or unspecified chronic kidney disease: Secondary | ICD-10-CM | POA: Insufficient documentation

## 2021-09-18 DIAGNOSIS — I639 Cerebral infarction, unspecified: Secondary | ICD-10-CM

## 2021-09-18 DIAGNOSIS — N183 Chronic kidney disease, stage 3 unspecified: Secondary | ICD-10-CM

## 2021-09-18 DIAGNOSIS — B1920 Unspecified viral hepatitis C without hepatic coma: Secondary | ICD-10-CM | POA: Diagnosis not present

## 2021-09-18 DIAGNOSIS — Z7901 Long term (current) use of anticoagulants: Secondary | ICD-10-CM | POA: Diagnosis not present

## 2021-09-18 DIAGNOSIS — I10 Essential (primary) hypertension: Secondary | ICD-10-CM | POA: Diagnosis not present

## 2021-09-18 DIAGNOSIS — N1831 Chronic kidney disease, stage 3a: Secondary | ICD-10-CM | POA: Diagnosis not present

## 2021-09-18 DIAGNOSIS — I739 Peripheral vascular disease, unspecified: Secondary | ICD-10-CM | POA: Diagnosis not present

## 2021-09-18 DIAGNOSIS — I5022 Chronic systolic (congestive) heart failure: Secondary | ICD-10-CM | POA: Diagnosis not present

## 2021-09-18 DIAGNOSIS — F1910 Other psychoactive substance abuse, uncomplicated: Secondary | ICD-10-CM

## 2021-09-18 DIAGNOSIS — I6340 Cerebral infarction due to embolism of unspecified cerebral artery: Secondary | ICD-10-CM

## 2021-09-18 LAB — BASIC METABOLIC PANEL
Anion gap: 11 (ref 5–15)
BUN: 19 mg/dL (ref 8–23)
CO2: 22 mmol/L (ref 22–32)
Calcium: 8.8 mg/dL — ABNORMAL LOW (ref 8.9–10.3)
Chloride: 107 mmol/L (ref 98–111)
Creatinine, Ser: 1.48 mg/dL — ABNORMAL HIGH (ref 0.61–1.24)
GFR, Estimated: 51 mL/min — ABNORMAL LOW (ref >60)
Glucose, Bld: 96 mg/dL (ref 70–99)
Potassium: 4.6 mmol/L (ref 3.5–5.1)
Sodium: 140 mmol/L (ref 135–145)

## 2021-09-18 LAB — CBC
HCT: 48.3 % (ref 39.0–52.0)
Hemoglobin: 16.2 g/dL (ref 13.0–17.0)
MCH: 33.1 pg (ref 26.0–34.0)
MCHC: 33.5 g/dL (ref 30.0–36.0)
MCV: 98.8 fL (ref 80.0–100.0)
Platelets: 62 10*3/uL — ABNORMAL LOW (ref 150–400)
RBC: 4.89 MIL/uL (ref 4.22–5.81)
RDW: 11.6 % (ref 11.5–15.5)
WBC: 7.1 10*3/uL (ref 4.0–10.5)
nRBC: 0 % (ref 0.0–0.2)

## 2021-09-18 MED ORDER — CARVEDILOL 12.5 MG PO TABS: 18.7500 mg | ORAL_TABLET | Freq: Two times a day (BID) | ORAL | 6 refills | Status: DC

## 2021-09-18 MED ORDER — ROSUVASTATIN CALCIUM 20 MG PO TABS: 20.0000 mg | ORAL_TABLET | Freq: Every day | ORAL | 3 refills | Status: DC

## 2021-09-18 NOTE — Patient Instructions (Addendum)
INCREASE Coreg to 18.75 mg (one and one half tab) twice a day   Labs today We will only contact you if something comes back abnormal or we need to make some changes. Otherwise no news is good news!  Your physician recommends that you schedule a follow-up appointment in: 4-6 weeks  in the Advanced Practitioners (PA/NP) Clinic   Your physician has requested that you have an echocardiogram. Echocardiography is a painless test that uses sound waves to create images of your heart. It provides your doctor with information about the size and shape of your heart and how well your hearts chambers and valves are working. This procedure takes approximately one hour. There are no restrictions for this procedure.  Be sure to follow up with Dr Pearlean Brownie   Do the following things EVERYDAY: Weigh yourself in the morning before breakfast. Write it down and keep it in a log. Take your medicines as prescribed Eat low salt foods--Limit salt (sodium) to 2000 mg per day.  Stay as active as you can everyday Limit all fluids for the day to less than 2 liters  At the Advanced Heart Failure Clinic, you and your health needs are our priority. As part of our continuing mission to provide you with exceptional heart care, we have created designated Provider Care Teams. These Care Teams include your primary Cardiologist (physician) and Advanced Practice Providers (APPs- Physician Assistants and Nurse Practitioners) who all work together to provide you with the care you need, when you need it.   You may see any of the following providers on your designated Care Team at your next follow up: Dr Arvilla Meres Dr Carron Curie, NP Robbie Lis, [place] [facility] [place], [place] Karle Plumber, PharmD   Please be sure to bring in all your medications bottles to every appointment.

## 2021-09-22 ENCOUNTER — Other Ambulatory Visit: Payer: Self-pay | Admitting: Internal Medicine

## 2021-10-15 ENCOUNTER — Telehealth (HOSPITAL_COMMUNITY): Payer: Self-pay

## 2021-10-15 NOTE — Progress Notes (Signed)
Advanced [facility] Failure Clinic Note  Date:  10/16/2021   ID:  Kevin [name], DOB April 01, 1951, MRN [id number]  Location: Home  Provider location: [facility] Type of Visit: Established patient  PCP:  Trey Sailors, PA  Cardiologist:  None Primary HF: [place]  Chief Complaint: Heart Failure follow-up   History of Present Illness:  Mr. Hammersley is a 71 y.o.  male with  PAD, CAD, severe HTN, polysubstance abuse (cocaine and ETOH), tobacco use, persistent Afib (on Eliquis), TIA, Hepatitis C and NICM with recovered EF.   Cath in 1/16 for abnormal Myoview. Found 90% tubular mid-ramus stenosis and 70% bifurcation PDA/PLB stenosis. Treated medically.  Admitted for possible CVA/TIA in 5/17 Cardiology was consulted. EKG on admit showed Afib RVR with rate of 170.  Echo EF 35-40% with LVH (DB looked at echo and felt EF more like 45-50% with AF with RVR). No cath performed.    Echo 6/21 EF 60-65%  Admitted again 7/21 for acute CVA.  MRI brain done revealing numerous acute cortical/subcortical embolic infarcts in L-ACA, MCA, PCA and watershed territories as well as multiple acute infarcts in left basal ganglia and left thalamus. UDS positive for cocaine and THC. Neurology recommended continuing Eliquis as well as risk factor modification. Required extensive therapy w/ CIR and eventual discharge to SNF.   He was lost to f/u for a period of time, not seen since 4/21.   Follow up 5/22, NYHA Class III, and Jardiance added. I saw him on 12/22 and wife was concerned he was having TIAs (right hand weakness/numbness, gait instability). BP elevated and beta blocker increased, referred to Neurology.  Today he returns for HF follow up with his wife. He remains SOB walking on flat ground for further distances. He uses a cane for balance. Denies CP, dizziness, edema, or PND/Orthopnea. Appetite ok. No fever or chills. Weight at home 215 pounds. Taking all medications. He  has Neurology appt next month. No further ETOH/drug use. Wife has not noticed anymore TIA-like symptoms, but continues with right hand numbness and weakness.  Cardiac Studies: - Echo 6/21: EF 60-65%, RV ok.  - LHC 1/16:  Left Main:  Long vessel, angiographically normal, trifurcates into LAD, LCX and Ramus branches Left Anterior Descending (LAD):  There is a proximal 20-30% eccentric, hazy plaque and mild mid-LAD stenosis, vessel reaches the apex 1st diagonal (D1):  Smaller branch, mild diffuse disease Circumflex (LCx):  AV groove vessel, gives off 2 OM branches, mild ostial stenosis. 1st obtuse marginal:  Smaller vessel, no stenosis. 2nd obtuse marginal:  Minimal luminal irregularities Ramus Intermedius:  There is a tubular 90% mid-Ramus stenosis, the vessel coarses to the lateral wall Right Coronary Artery: Dominant artery. Mild luminal irregularities. right ventricle branch of right coronary artery: No stenosis. posterior descending artery: There is a 70% ostial bifurcation stenosis, however, this is <2.0 mm in diameter posterior lateral branch:  Ostial stenosis (see above)  Past Medical History:  Diagnosis Date   Atrial fibrillation, permanent ([place]) 02/01/2016   Blood transfusion without reported diagnosis    CAD in native artery cath 2016 90% mRamus  02/01/2016   CHF (congestive heart failure) ([place])    Cholelithiases 05/01/2012   Claudication ([place])    bilateral   Cocaine abuse ([place])    Dysrhythmia    afib   ETOH abuse    Hepatitis C antibody test positive 04/2012   Hyperpigmentation    Hypertension    PAD (peripheral artery  disease) ([place])    Persistent atrial fibrillation ([place]) 08/23/2014   Thrombocytopenia ([facility])    Tobacco abuse    Past Surgical History:  Procedure Laterality Date   Cardiolite     negative for ischemia   CARDIOVASCULAR STRESS TEST     CARDIOVERSION  05/05/2012   Procedure: CARDIOVERSION;  Surgeon: Sanda Klein, MD;  Location: [facility];  Service:  Cardiovascular;  Laterality: N/A;   CAROTID ARTERY ANGIOPLASTY     DOPPLER ECHOCARDIOGRAPHY     ENDOVASCULAR STENT INSERTION  right leg   LEFT HEART CATHETERIZATION WITH CORONARY ANGIOGRAM N/A 05/01/2012   Procedure: LEFT HEART CATHETERIZATION WITH CORONARY ANGIOGRAM;  Surgeon: Leonie Man, MD;  Location: [facility] CATH LAB;  Service: Cardiovascular;  Laterality: N/A;   LEFT HEART CATHETERIZATION WITH CORONARY ANGIOGRAM N/A 09/27/2014   Procedure: LEFT HEART CATHETERIZATION WITH CORONARY ANGIOGRAM;  Surgeon: Pixie Casino, MD;  Location: [facility] CATH LAB;  Service: Cardiovascular;  Laterality: N/A;   TEE WITHOUT CARDIOVERSION  05/05/2012   Procedure: TRANSESOPHAGEAL ECHOCARDIOGRAM (TEE);  Surgeon: Sanda Klein, MD;  Location: [facility] ENDOSCOPY;  Service: Cardiovascular;  Laterality: N/A;    Current Outpatient Medications  Medication Sig Dispense Refill   albuterol (VENTOLIN HFA) 108 (90 Base) MCG/ACT inhaler Inhale 2 puffs into the lungs every 6 (six) hours as needed for wheezing or shortness of breath. 18 g 5   amLODipine (NORVASC) 5 MG tablet Take 5 mg by mouth daily.     carvedilol (COREG) 12.5 MG tablet Take 1.5 tablets (18.75 mg total) by mouth 2 (two) times daily with a meal. 90 tablet 6   ELIQUIS 5 MG TABS tablet TAKE 1 TABLET BY MOUTH TWICE A DAY 60 tablet 11   empagliflozin (JARDIANCE) 10 MG TABS tablet Take 1 tablet (10 mg total) by mouth daily before breakfast. 30 tablet 11   ENTRESTO 97-103 MG TAKE 1 TABLET BY MOUTH TWICE A DAY 60 tablet 11   Multiple Vitamin (MULTIVITAMIN WITH MINERALS) TABS tablet Take 1 tablet by mouth daily. Men's One a Day     rosuvastatin (CRESTOR) 20 MG tablet Take 1 tablet (20 mg total) by mouth daily. 90 tablet 3   TRELEGY ELLIPTA 100-62.5-25 MCG/INH AEPB Take 1 puff by mouth daily.     spironolactone (ALDACTONE) 25 MG tablet Take 0.5 tablets (12.5 mg total) by mouth daily. 45 tablet 6   No current facility-administered medications for this encounter.     Allergies:   Patient has no known allergies.   Social History:  The patient  reports that he has quit smoking. His smoking use included cigarettes. He has a 10.00 pack-year smoking history. He has never used smokeless tobacco. He reports that he does not currently use alcohol. He reports that he does not use drugs.   Family History:  The patient's family history includes Cancer in his mother; Seizures in his daughter.   ROS:  Please see the history of present illness.   All other systems are personally reviewed and negative.   Recent Labs: 09/18/2021: BUN 19; Creatinine, Ser 1.48; Hemoglobin 16.2; Platelets 62; Potassium 4.6; Sodium 140  Personally reviewed   Wt Readings from Last 3 Encounters:  10/16/21 97.1 kg (214 lb)  09/18/21 97.5 kg (215 lb)  02/14/21 102.7 kg (226 lb 6.4 oz)    BP 119/70    Pulse 78    Wt 97.1 kg (214 lb)    SpO2 97%    BMI 29.02 kg/m   General:  NAD. No resp difficulty, walked  into clinic with cane. HEENT: +HOH Neck: Supple. No JVD. Carotids 2+ bilat; no bruits. No lymphadenopathy or thryomegaly appreciated. Cor: PMI nondisplaced. Irregular rate & rhythm. No rubs, gallops or murmurs. Lungs: Clear Abdomen: Soft, nontender, nondistended. No hepatosplenomegaly. No bruits or masses. Good bowel sounds. Extremities: No cyanosis, clubbing, rash, edema Neuro: Alert & oriented x 3, cranial nerves grossly intact. Moves all 4 extremities w/o difficulty. Affect pleasant. Mild dysarthria  ASSESSMENT AND PLAN: 1. Chronic systolic HF due to NICM with recovered EF - Echo (6/17): EF 35-40%  - Echo (6/21): EF 60-65% - NYHA Class III, functional class difficult due to right-sided weakness from prior CVA. - Copntinue carvedilol 18.75 mg bid.  - Continue spiro 12.5 mg daily. - Continue Entresto 97-103 mg bid. - Continue Jardiance 10 mg daily.  - Discussed daily wts, low sodium diet and fluid restriction.  - Repeat echo scheduled. - BMET today.  2. Chronic AF - ?  blocker as above, rate controlled. - Continue Eliquis.  Denies bleeding.  - Recent hgb stable.  3. CAD  - Stable. No CP.  - Continue statin. . - No ASA with Eliquis use.   4. Severe HTN - Improved today. - May be able to stop amlodipine and increase spiro at next visit.  5. Polysubstance abuse   - Reports cessation.  6. Tobacco use  - Reports he quit.    7. CKD stage IIIa - Baseline SCr ~1.4.  - Labs today.  8. PAD - Follows with Dr. Fletcher Anon. - Denies claudication.   9. CVA -  acute CVA 7/21.  MRI brain showed numerous acute cortical/subcortical embolic infarcts in L-ACA, MCA, PCA and watershed territories as well as multiple acute infarcts in left basal ganglia and left thalamus.  - Continue Eliquis and statin.  - Worsening right hand weakness and gait imbalance. - He has been referred to Neurology. May benefit from Neuro PT.  Follow up in 4 months with Dr. Haroldine Laws.  [place], FNP  10/16/2021 5:15 PM  Advanced [facility] [place] [address] Heart and [place] [address] [phone] (office) [phone] (fax)

## 2021-10-15 NOTE — Telephone Encounter (Signed)
Called and left patient a detailed message to confirm/remind patient of their appointment at the [facility] Clinic on 10/16/21.

## 2021-10-16 ENCOUNTER — Encounter (HOSPITAL_COMMUNITY): Payer: Self-pay

## 2021-10-16 ENCOUNTER — Other Ambulatory Visit: Payer: Self-pay

## 2021-10-16 ENCOUNTER — Ambulatory Visit (HOSPITAL_COMMUNITY)
Admission: RE | Admit: 2021-10-16 | Discharge: 2021-10-16 | Disposition: A | Discharge status: Home / Self Care | Payer: Medicare HMO | Source: Ambulatory Visit | Attending: Family Medicine | Admitting: Family Medicine

## 2021-10-16 VITALS — BP 119/70 | HR 78 | Wt 214.0 lb

## 2021-10-16 DIAGNOSIS — I639 Cerebral infarction, unspecified: Secondary | ICD-10-CM | POA: Diagnosis not present

## 2021-10-16 DIAGNOSIS — N1831 Chronic kidney disease, stage 3a: Secondary | ICD-10-CM | POA: Diagnosis not present

## 2021-10-16 DIAGNOSIS — I428 Other cardiomyopathies: Secondary | ICD-10-CM | POA: Insufficient documentation

## 2021-10-16 DIAGNOSIS — I2510 Atherosclerotic heart disease of native coronary artery without angina pectoris: Secondary | ICD-10-CM | POA: Insufficient documentation

## 2021-10-16 DIAGNOSIS — Z87891 Personal history of nicotine dependence: Secondary | ICD-10-CM | POA: Insufficient documentation

## 2021-10-16 DIAGNOSIS — I739 Peripheral vascular disease, unspecified: Secondary | ICD-10-CM | POA: Diagnosis not present

## 2021-10-16 DIAGNOSIS — I69351 Hemiplegia and hemiparesis following cerebral infarction affecting right dominant side: Secondary | ICD-10-CM | POA: Insufficient documentation

## 2021-10-16 DIAGNOSIS — I10 Essential (primary) hypertension: Secondary | ICD-10-CM | POA: Diagnosis not present

## 2021-10-16 DIAGNOSIS — Z7901 Long term (current) use of anticoagulants: Secondary | ICD-10-CM | POA: Diagnosis not present

## 2021-10-16 DIAGNOSIS — Z8619 Personal history of other infectious and parasitic diseases: Secondary | ICD-10-CM | POA: Diagnosis not present

## 2021-10-16 DIAGNOSIS — Z79899 Other long term (current) drug therapy: Secondary | ICD-10-CM | POA: Insufficient documentation

## 2021-10-16 DIAGNOSIS — I5022 Chronic systolic (congestive) heart failure: Secondary | ICD-10-CM | POA: Insufficient documentation

## 2021-10-16 DIAGNOSIS — I4821 Permanent atrial fibrillation: Secondary | ICD-10-CM | POA: Diagnosis not present

## 2021-10-16 DIAGNOSIS — I130 Hypertensive heart and chronic kidney disease with heart failure and stage 1 through stage 4 chronic kidney disease, or unspecified chronic kidney disease: Secondary | ICD-10-CM | POA: Insufficient documentation

## 2021-10-16 DIAGNOSIS — F1910 Other psychoactive substance abuse, uncomplicated: Secondary | ICD-10-CM

## 2021-10-16 DIAGNOSIS — N183 Chronic kidney disease, stage 3 unspecified: Secondary | ICD-10-CM

## 2021-10-16 LAB — BRAIN NATRIURETIC PEPTIDE: B Natriuretic Peptide: 293.3 pg/mL — ABNORMAL HIGH (ref 0.0–100.0)

## 2021-10-16 LAB — BASIC METABOLIC PANEL
Anion gap: 16 — ABNORMAL HIGH (ref 5–15)
BUN: 14 mg/dL (ref 8–23)
CO2: 22 mmol/L (ref 22–32)
Calcium: 9.1 mg/dL (ref 8.9–10.3)
Chloride: 103 mmol/L (ref 98–111)
Creatinine, Ser: 1.3 mg/dL — ABNORMAL HIGH (ref 0.61–1.24)
GFR, Estimated: 59 mL/min — ABNORMAL LOW (ref >60)
Glucose, Bld: 127 mg/dL — ABNORMAL HIGH (ref 70–99)
Potassium: 3.9 mmol/L (ref 3.5–5.1)
Sodium: 141 mmol/L (ref 135–145)

## 2021-10-16 MED ORDER — SPIRONOLACTONE 25 MG PO TABS: 12.5000 mg | ORAL_TABLET | Freq: Every day | ORAL | 6 refills | Status: DC

## 2021-10-16 NOTE — Patient Instructions (Signed)
Medication Changes:  No changes  Lab Work:  Labs done today, your results will be available in MyChart, we will contact you for abnormal readings.   Testing/Procedures:  none  Referrals:  none  Special Instructions // Education:  none  Follow-Up in: 4 months with Dr. Gala Romney  At the [facility] Clinic, you and your health needs are our priority. We have a designated team specialized in the treatment of Heart Failure. This Care Team includes your primary Heart Failure Specialized Cardiologist (physician), Advanced Practice Providers (APPs- Physician Assistants and Nurse Practitioners), and Pharmacist who all work together to provide you with the care you need, when you need it.   You may see any of the following providers on your designated Care Team at your next follow up:  Dr Arvilla Meres Dr Carron Curie, NP Robbie Lis, [place] [facility] [place], [place] Karle Plumber, PharmD   Please be sure to bring in all your medications bottles to every appointment.   Need to Contact us:  If you have any questions or concerns before your next appointment please send [place] a message through [place] or call our office at [phone].    TO LEAVE A MESSAGE FOR THE NURSE SELECT OPTION 2, PLEASE LEAVE A MESSAGE INCLUDING: YOUR NAME DATE OF BIRTH CALL BACK NUMBER REASON FOR CALL**this is important as we prioritize the call backs  YOU WILL RECEIVE A CALL BACK THE SAME DAY AS LONG AS YOU CALL BEFORE 4:00 PM

## 2021-10-18 DIAGNOSIS — Z0001 Encounter for general adult medical examination with abnormal findings: Secondary | ICD-10-CM | POA: Diagnosis not present

## 2021-10-18 DIAGNOSIS — J410 Simple chronic bronchitis: Secondary | ICD-10-CM | POA: Diagnosis not present

## 2021-10-18 DIAGNOSIS — Z6830 Body mass index (BMI) 30.0-30.9, adult: Secondary | ICD-10-CM | POA: Diagnosis not present

## 2021-10-18 DIAGNOSIS — N529 Male erectile dysfunction, unspecified: Secondary | ICD-10-CM | POA: Diagnosis not present

## 2021-10-18 DIAGNOSIS — I509 Heart failure, unspecified: Secondary | ICD-10-CM | POA: Diagnosis not present

## 2021-10-18 DIAGNOSIS — I2510 Atherosclerotic heart disease of native coronary artery without angina pectoris: Secondary | ICD-10-CM | POA: Diagnosis not present

## 2021-10-18 DIAGNOSIS — Z720 Tobacco use: Secondary | ICD-10-CM | POA: Diagnosis not present

## 2021-10-18 DIAGNOSIS — Z125 Encounter for screening for malignant neoplasm of prostate: Secondary | ICD-10-CM | POA: Diagnosis not present

## 2021-10-18 DIAGNOSIS — I10 Essential (primary) hypertension: Secondary | ICD-10-CM | POA: Diagnosis not present

## 2021-10-18 DIAGNOSIS — I480 Paroxysmal atrial fibrillation: Secondary | ICD-10-CM | POA: Diagnosis not present

## 2021-10-18 DIAGNOSIS — E7800 Pure hypercholesterolemia, unspecified: Secondary | ICD-10-CM | POA: Diagnosis not present

## 2021-10-18 DIAGNOSIS — E6609 Other obesity due to excess calories: Secondary | ICD-10-CM | POA: Diagnosis not present

## 2021-10-18 DIAGNOSIS — E785 Hyperlipidemia, unspecified: Secondary | ICD-10-CM | POA: Diagnosis not present

## 2021-10-22 ENCOUNTER — Other Ambulatory Visit (HOSPITAL_COMMUNITY): Payer: Self-pay | Admitting: *Deleted

## 2021-10-22 DIAGNOSIS — I5022 Chronic systolic (congestive) heart failure: Secondary | ICD-10-CM

## 2021-10-26 ENCOUNTER — Ambulatory Visit (HOSPITAL_COMMUNITY): Payer: Medicare HMO

## 2021-10-31 DIAGNOSIS — K769 Liver disease, unspecified: Secondary | ICD-10-CM | POA: Diagnosis not present

## 2021-10-31 DIAGNOSIS — E7800 Pure hypercholesterolemia, unspecified: Secondary | ICD-10-CM | POA: Diagnosis not present

## 2021-10-31 DIAGNOSIS — J410 Simple chronic bronchitis: Secondary | ICD-10-CM | POA: Diagnosis not present

## 2021-10-31 DIAGNOSIS — I10 Essential (primary) hypertension: Secondary | ICD-10-CM | POA: Diagnosis not present

## 2021-10-31 DIAGNOSIS — Z0001 Encounter for general adult medical examination with abnormal findings: Secondary | ICD-10-CM | POA: Diagnosis not present

## 2021-11-01 ENCOUNTER — Ambulatory Visit (HOSPITAL_COMMUNITY): Payer: Medicare HMO | Attending: Internal Medicine

## 2021-12-10 ENCOUNTER — Ambulatory Visit: Payer: Medicare HMO | Admitting: Neurology

## 2022-02-06 ENCOUNTER — Ambulatory Visit: Payer: Medicare HMO | Admitting: Neurology

## 2022-02-12 ENCOUNTER — Ambulatory Visit (HOSPITAL_COMMUNITY)
Admission: RE | Admit: 2022-02-12 | Discharge: 2022-02-12 | Disposition: A | Discharge status: Home / Self Care | Payer: Medicare Other | Source: Ambulatory Visit | Attending: Internal Medicine | Admitting: Internal Medicine

## 2022-02-12 ENCOUNTER — Encounter (HOSPITAL_COMMUNITY): Payer: Self-pay | Admitting: Internal Medicine

## 2022-02-12 VITALS — BP 118/78 | HR 74 | Wt 210.2 lb

## 2022-02-12 DIAGNOSIS — I2510 Atherosclerotic heart disease of native coronary artery without angina pectoris: Secondary | ICD-10-CM | POA: Diagnosis not present

## 2022-02-12 DIAGNOSIS — N183 Chronic kidney disease, stage 3 unspecified: Secondary | ICD-10-CM

## 2022-02-12 DIAGNOSIS — I5022 Chronic systolic (congestive) heart failure: Secondary | ICD-10-CM | POA: Insufficient documentation

## 2022-02-12 DIAGNOSIS — I10 Essential (primary) hypertension: Secondary | ICD-10-CM | POA: Diagnosis not present

## 2022-02-12 NOTE — Progress Notes (Signed)
ReDS Vest / Clip - 02/12/22 1600       ReDS Vest / Clip   Station Marker D    Ruler Value 37    ReDS Value Range Low volume    ReDS Actual Value 29

## 2022-02-12 NOTE — Patient Instructions (Signed)
Medication Changes:  No changes. Continue current regimen.   Testing/Procedures:  Your physician has requested that you have an echocardiogram. Echocardiography is a painless test that uses sound waves to create images of your heart. It provides your doctor with information about the size and shape of your heart and how well your heart's chambers and valves are working. This procedure takes approximately one hour. There are no restrictions for this procedure.  Special Instructions // Education:  Do the following things EVERYDAY: Weigh yourself in the morning before breakfast. Write it down and keep it in a log. Take your medicines as prescribed Eat low salt foods--Limit salt (sodium) to 2000 mg per day.  Stay as active as you can everyday Limit all fluids for the day to less than 2 liters  Follow-Up in: 6 months. We will call you to schedule this appointment.   At the [facility], you and your health needs are our priority. We have a designated team specialized in the treatment of Heart Failure. This Care Team includes your primary Heart Failure Specialized Cardiologist (physician), Advanced Practice Providers (APPs- Physician Assistants and Nurse Practitioners), and Pharmacist who all work together to provide you with the care you need, when you need it.   You may see any of the following providers on your designated Care Team at your next follow up:  Dr Arvilla Meres Dr Carron Curie, NP Robbie Lis, [place] [facility] [place], [place] Karle Plumber, PharmD   Please be sure to bring in all your medications bottles to every appointment.   Need to Contact us:  If you have any questions or concerns before your next appointment please send [place] a message through [place] or call our office at [phone].    TO LEAVE A MESSAGE FOR THE NURSE SELECT OPTION 2, PLEASE LEAVE A MESSAGE INCLUDING: YOUR NAME DATE OF BIRTH CALL BACK NUMBER REASON  FOR CALL**this is important as we prioritize the call backs  YOU WILL RECEIVE A CALL BACK THE SAME DAY AS LONG AS YOU CALL BEFORE 4:00 PM

## 2022-02-12 NOTE — Progress Notes (Signed)
Advanced Heart Failure Clinic Note  Date:  02/12/2022   ID:  PORTER BUSTILLOS, DOB June 12, 1951, MRN [id number]  Location: Home  Provider location: [facility] Type of Visit: Established patient  PCP:  Trey Sailors, PA  Cardiologist:  None Primary HF: Bence Trapp  Chief Complaint: Heart Failure follow-up   History of Present Illness:  Mr. Turkovich is a 71 y/o  male with  PAD, CAD, severe HTN, polysubstance abuse (cocaine and ETOH), tobacco use, persistent Afib (on Eliquis), TIA, Hepatitis C and NICM with recovered EF.   We have not seen him since 4/21    Cath in 1/16 for abnormal Myoview. Found 90% tubular mid-ramus stenosis and 70% bifurcation PDA/PLB stenosis. Treated medically.  Admitted for possible CVA/TIA in 5/17 Cardiology was consulted. EKG when patient was admitted showed Afib RVR with rate of 170.  Echo EF 35-40% with LVH (I looked at echo and feel EF more like 45-50% with AF with RVR). No cath performed.    Echo 6/21 EF 60-65%  Here for f/u. We have not seen him in 1 year. Here with his wife. Says he gets SOB with almost any walking. No orthopnea or PND. No edema. No tobacco or ETOH use.    CARDIAC CATHETERIZATION 09/27/14  Left Main:  Long vessel, angiographically normal, trifurcates into LAD, LCX and Ramus branches Left Anterior Descending (LAD):  There is a proximal 20-30% eccentric, hazy plaque and mild mid-LAD stenosis, vessel reaches the apex 1st diagonal (D1):  Smaller branch, mild diffuse disease Circumflex (LCx):  AV groove vessel, gives off 2 OM branches, mild ostial stenosis. 1st obtuse marginal:  Smaller vessel, no stenosis. 2nd obtuse marginal:  Minimal luminal irregularities Ramus Intermedius:  There is a tubular 90% mid-Ramus stenosis, the vessel coarses to the lateral wall Right Coronary Artery: Dominant artery. Mild luminal irregularities. right ventricle branch of right coronary artery: No stenosis. posterior  descending artery: There is a 70% ostial bifurcation stenosis, however, this is <2.0 mm in diameter posterior lateral branch:  Ostial stenosis (see above)    Aaron Mose denies symptoms worrisome for COVID 19.   Past Medical History:  Diagnosis Date   Atrial fibrillation, permanent ([place]) 02/01/2016   Blood transfusion without reported diagnosis    CAD in native artery cath 2016 90% mRamus  02/01/2016   CHF (congestive heart failure) ([place])    Cholelithiases 05/01/2012   Claudication ([place])    bilateral   Cocaine abuse ([place])    Dysrhythmia    afib   ETOH abuse    Hepatitis C antibody test positive 04/2012   Hyperpigmentation    Hypertension    PAD (peripheral artery disease) ([facility])    Persistent atrial fibrillation ([place]) 08/23/2014   Thrombocytopenia ([place])    Tobacco abuse    Past Surgical History:  Procedure Laterality Date   Cardiolite     negative for ischemia   CARDIOVASCULAR STRESS TEST     CARDIOVERSION  05/05/2012   Procedure: CARDIOVERSION;  Surgeon: Sanda Klein, MD;  Location: [facility];  Service: Cardiovascular;  Laterality: N/A;   CAROTID ARTERY ANGIOPLASTY     DOPPLER ECHOCARDIOGRAPHY     ENDOVASCULAR STENT INSERTION  right leg   LEFT HEART CATHETERIZATION WITH CORONARY ANGIOGRAM N/A 05/01/2012   Procedure: LEFT HEART CATHETERIZATION WITH CORONARY ANGIOGRAM;  Surgeon: Leonie Man, MD;  Location: [facility] CATH LAB;  Service: Cardiovascular;  Laterality: N/A;   LEFT HEART CATHETERIZATION WITH CORONARY ANGIOGRAM N/A  09/27/2014   Procedure: LEFT HEART CATHETERIZATION WITH CORONARY ANGIOGRAM;  Surgeon: Pixie Casino, MD;  Location: [facility] CATH LAB;  Service: Cardiovascular;  Laterality: N/A;   TEE WITHOUT CARDIOVERSION  05/05/2012   Procedure: TRANSESOPHAGEAL ECHOCARDIOGRAM (TEE);  Surgeon: Sanda Klein, MD;  Location: [facility] ENDOSCOPY;  Service: Cardiovascular;  Laterality: N/A;     Current Outpatient Medications  Medication Sig Dispense Refill   albuterol (VENTOLIN  HFA) 108 (90 Base) MCG/ACT inhaler Inhale 2 puffs into the lungs every 6 (six) hours as needed for wheezing or shortness of breath. 18 g 5   amLODipine (NORVASC) 5 MG tablet Take 5 mg by mouth daily.     carvedilol (COREG) 12.5 MG tablet Take 1.5 tablets (18.75 mg total) by mouth 2 (two) times daily with a meal. 90 tablet 6   ELIQUIS 5 MG TABS tablet TAKE 1 TABLET BY MOUTH TWICE A DAY 60 tablet 11   empagliflozin (JARDIANCE) 10 MG TABS tablet Take 1 tablet (10 mg total) by mouth daily before breakfast. 30 tablet 11   ENTRESTO 97-103 MG TAKE 1 TABLET BY MOUTH TWICE A DAY 60 tablet 11   Multiple Vitamin (MULTIVITAMIN WITH MINERALS) TABS tablet Take 1 tablet by mouth daily. Men's One a Day     rosuvastatin (CRESTOR) 20 MG tablet Take 1 tablet (20 mg total) by mouth daily. 90 tablet 3   spironolactone (ALDACTONE) 25 MG tablet Take 0.5 tablets (12.5 mg total) by mouth daily. 45 tablet 6   TRELEGY ELLIPTA 100-62.5-25 MCG/INH AEPB Take 1 puff by mouth daily.     No current facility-administered medications for this encounter.    Allergies:   Patient has no known allergies.   Social History:  The patient  reports that he has quit smoking. His smoking use included cigarettes. He has a 10.00 pack-year smoking history. He has never used smokeless tobacco. He reports that he does not currently use alcohol. He reports that he does not use drugs.   Family History:  The patient's family history includes Cancer in his mother; Seizures in his daughter.   ROS:  Please see the history of present illness.   All other systems are personally reviewed and negative.   Vitals:   02/12/22 1520  BP: 118/78  Pulse: 74  SpO2: 98%  Weight: 95.3 kg (210 lb 3.2 oz)    Exam:   General:  Well appearing. No resp difficulty HEENT: normal Neck: supple. no JVD. Carotids 2+ bilat; no bruits. No lymphadenopathy or thryomegaly appreciated. Cor: PMI nondisplaced. Irregular rate & rhythm. No rubs, gallops or murmurs. Lungs:  clear Abdomen: soft, nontender, nondistended. No hepatosplenomegaly. No bruits or masses. Good bowel sounds. Extremities: no cyanosis, clubbing, rash, edema Neuro: alert & orientedx3, cranial nerves grossly intact. moves all 4 extremities w/o difficulty. Affect pleasant   Recent Labs: 09/18/2021: Hemoglobin 16.2; Platelets 62 10/16/2021: B Natriuretic Peptide 293.3; BUN 14; Creatinine, Ser 1.30; Potassium 3.9; Sodium 141  Personally reviewed   Wt Readings from Last 3 Encounters:  02/12/22 95.3 kg (210 lb 3.2 oz)  10/16/21 97.1 kg (214 lb)  09/18/21 97.5 kg (215 lb)    ECG: AF 106 RBBB Personally reviewed  ASSESSMENT AND PLAN:  1. Chronic systolic HF duet o NICM with recovered EF - EF 35-40% by echo 6/17 - Echo 6/21 EF 60-65% - Stable NYHA III. Volume status stable. - Continue current regimen with Entresto, carvedilol, sprio and lasix   2. Chronic AF - Rate slightly elevated. - Continue Eliquis. No bleeding  3. CAD  - Stable. No s/s ischemia - Continue statin. No ASA with Eliquis   4. Severe HTN - Blood pressure with improved control but DBP still high.   5. Polysubstance abuse   - reports cessation  6. Tobacco use  - back smoking a few cigs per day. Encouraged cessation.   7. CKD stage IIIa - check labs today  8. PAD - Follows with Dr. Toy Care, MD  02/12/2022 3:47 PM  Advanced [facility] Failure [place] [address] Heart and [place] [address] [phone] (office) [phone] (fax)

## 2022-02-13 ENCOUNTER — Encounter (HOSPITAL_COMMUNITY): Payer: Medicaid Other | Admitting: Internal Medicine

## 2022-02-20 DIAGNOSIS — Z8673 Personal history of transient ischemic attack (TIA), and cerebral infarction without residual deficits: Secondary | ICD-10-CM | POA: Diagnosis not present

## 2022-02-20 DIAGNOSIS — J410 Simple chronic bronchitis: Secondary | ICD-10-CM | POA: Diagnosis not present

## 2022-02-20 DIAGNOSIS — I480 Paroxysmal atrial fibrillation: Secondary | ICD-10-CM | POA: Diagnosis not present

## 2022-02-20 DIAGNOSIS — E785 Hyperlipidemia, unspecified: Secondary | ICD-10-CM | POA: Diagnosis not present

## 2022-02-20 DIAGNOSIS — Z720 Tobacco use: Secondary | ICD-10-CM | POA: Diagnosis not present

## 2022-02-20 DIAGNOSIS — E7800 Pure hypercholesterolemia, unspecified: Secondary | ICD-10-CM | POA: Diagnosis not present

## 2022-02-20 DIAGNOSIS — K769 Liver disease, unspecified: Secondary | ICD-10-CM | POA: Diagnosis not present

## 2022-02-20 DIAGNOSIS — I10 Essential (primary) hypertension: Secondary | ICD-10-CM | POA: Diagnosis not present

## 2022-02-20 DIAGNOSIS — I2510 Atherosclerotic heart disease of native coronary artery without angina pectoris: Secondary | ICD-10-CM | POA: Diagnosis not present

## 2022-02-20 DIAGNOSIS — N182 Chronic kidney disease, stage 2 (mild): Secondary | ICD-10-CM | POA: Diagnosis not present

## 2022-02-20 DIAGNOSIS — I509 Heart failure, unspecified: Secondary | ICD-10-CM | POA: Diagnosis not present

## 2022-03-04 ENCOUNTER — Ambulatory Visit (HOSPITAL_COMMUNITY)
Admission: RE | Admit: 2022-03-04 | Discharge: 2022-03-04 | Disposition: A | Discharge status: Home / Self Care | Payer: Medicare Other | Source: Ambulatory Visit | Attending: Internal Medicine | Admitting: Internal Medicine

## 2022-03-04 DIAGNOSIS — I2510 Atherosclerotic heart disease of native coronary artery without angina pectoris: Secondary | ICD-10-CM | POA: Insufficient documentation

## 2022-03-04 DIAGNOSIS — I130 Hypertensive heart and chronic kidney disease with heart failure and stage 1 through stage 4 chronic kidney disease, or unspecified chronic kidney disease: Secondary | ICD-10-CM | POA: Insufficient documentation

## 2022-03-04 DIAGNOSIS — Z87891 Personal history of nicotine dependence: Secondary | ICD-10-CM | POA: Diagnosis not present

## 2022-03-04 DIAGNOSIS — I739 Peripheral vascular disease, unspecified: Secondary | ICD-10-CM | POA: Insufficient documentation

## 2022-03-04 DIAGNOSIS — I4891 Unspecified atrial fibrillation: Secondary | ICD-10-CM | POA: Insufficient documentation

## 2022-03-04 DIAGNOSIS — I083 Combined rheumatic disorders of mitral, aortic and tricuspid valves: Secondary | ICD-10-CM | POA: Insufficient documentation

## 2022-03-04 DIAGNOSIS — N189 Chronic kidney disease, unspecified: Secondary | ICD-10-CM | POA: Insufficient documentation

## 2022-03-04 DIAGNOSIS — I5022 Chronic systolic (congestive) heart failure: Secondary | ICD-10-CM | POA: Diagnosis not present

## 2022-03-04 DIAGNOSIS — Z8673 Personal history of transient ischemic attack (TIA), and cerebral infarction without residual deficits: Secondary | ICD-10-CM | POA: Insufficient documentation

## 2022-03-04 LAB — ECHOCARDIOGRAM COMPLETE
Area-P 1/2: 5.93 cm2
S' Lateral: 2.5 cm

## 2022-03-04 NOTE — Progress Notes (Signed)
  Echocardiogram 2D Echocardiogram has been performed. [name] stated Mr. Nebel experienced chest pain and nausea this morning along with general fatigue. I strongly encouraged an emergency room visit.  Darlina Sicilian M 03/04/2022, 3:39 PM

## 2022-03-06 ENCOUNTER — Other Ambulatory Visit (HOSPITAL_COMMUNITY): Payer: Self-pay | Admitting: Internal Medicine

## 2022-03-07 ENCOUNTER — Other Ambulatory Visit (HOSPITAL_COMMUNITY): Payer: Self-pay | Admitting: Cardiology

## 2022-03-07 DIAGNOSIS — J410 Simple chronic bronchitis: Secondary | ICD-10-CM | POA: Diagnosis not present

## 2022-03-07 DIAGNOSIS — E785 Hyperlipidemia, unspecified: Secondary | ICD-10-CM | POA: Diagnosis not present

## 2022-03-07 DIAGNOSIS — I10 Essential (primary) hypertension: Secondary | ICD-10-CM | POA: Diagnosis not present

## 2022-03-07 DIAGNOSIS — K769 Liver disease, unspecified: Secondary | ICD-10-CM | POA: Diagnosis not present

## 2022-03-07 DIAGNOSIS — N182 Chronic kidney disease, stage 2 (mild): Secondary | ICD-10-CM | POA: Diagnosis not present

## 2022-03-07 DIAGNOSIS — E7800 Pure hypercholesterolemia, unspecified: Secondary | ICD-10-CM | POA: Diagnosis not present

## 2022-03-17 ENCOUNTER — Other Ambulatory Visit (HOSPITAL_COMMUNITY): Payer: Self-pay | Admitting: Internal Medicine

## 2022-06-16 ENCOUNTER — Other Ambulatory Visit (HOSPITAL_COMMUNITY): Payer: Self-pay | Admitting: Internal Medicine

## 2022-07-09 DIAGNOSIS — K769 Liver disease, unspecified: Secondary | ICD-10-CM | POA: Diagnosis not present

## 2022-07-09 DIAGNOSIS — Z720 Tobacco use: Secondary | ICD-10-CM | POA: Diagnosis not present

## 2022-07-09 DIAGNOSIS — J410 Simple chronic bronchitis: Secondary | ICD-10-CM | POA: Diagnosis not present

## 2022-07-09 DIAGNOSIS — I509 Heart failure, unspecified: Secondary | ICD-10-CM | POA: Diagnosis not present

## 2022-07-09 DIAGNOSIS — Z8673 Personal history of transient ischemic attack (TIA), and cerebral infarction without residual deficits: Secondary | ICD-10-CM | POA: Diagnosis not present

## 2022-07-09 DIAGNOSIS — N182 Chronic kidney disease, stage 2 (mild): Secondary | ICD-10-CM | POA: Diagnosis not present

## 2022-07-09 DIAGNOSIS — K219 Gastro-esophageal reflux disease without esophagitis: Secondary | ICD-10-CM | POA: Diagnosis not present

## 2022-07-09 DIAGNOSIS — I2510 Atherosclerotic heart disease of native coronary artery without angina pectoris: Secondary | ICD-10-CM | POA: Diagnosis not present

## 2022-07-09 DIAGNOSIS — E785 Hyperlipidemia, unspecified: Secondary | ICD-10-CM | POA: Diagnosis not present

## 2022-07-09 DIAGNOSIS — E7800 Pure hypercholesterolemia, unspecified: Secondary | ICD-10-CM | POA: Diagnosis not present

## 2022-07-09 DIAGNOSIS — I480 Paroxysmal atrial fibrillation: Secondary | ICD-10-CM | POA: Diagnosis not present

## 2022-07-09 DIAGNOSIS — I10 Essential (primary) hypertension: Secondary | ICD-10-CM | POA: Diagnosis not present

## 2022-08-14 DIAGNOSIS — E7800 Pure hypercholesterolemia, unspecified: Secondary | ICD-10-CM | POA: Diagnosis not present

## 2022-08-14 DIAGNOSIS — Z720 Tobacco use: Secondary | ICD-10-CM | POA: Diagnosis not present

## 2022-08-14 DIAGNOSIS — K219 Gastro-esophageal reflux disease without esophagitis: Secondary | ICD-10-CM | POA: Diagnosis not present

## 2022-08-14 DIAGNOSIS — I480 Paroxysmal atrial fibrillation: Secondary | ICD-10-CM | POA: Diagnosis not present

## 2022-08-14 DIAGNOSIS — E785 Hyperlipidemia, unspecified: Secondary | ICD-10-CM | POA: Diagnosis not present

## 2022-08-14 DIAGNOSIS — N182 Chronic kidney disease, stage 2 (mild): Secondary | ICD-10-CM | POA: Diagnosis not present

## 2022-08-14 DIAGNOSIS — I2510 Atherosclerotic heart disease of native coronary artery without angina pectoris: Secondary | ICD-10-CM | POA: Diagnosis not present

## 2022-08-14 DIAGNOSIS — Z8673 Personal history of transient ischemic attack (TIA), and cerebral infarction without residual deficits: Secondary | ICD-10-CM | POA: Diagnosis not present

## 2022-08-14 DIAGNOSIS — I509 Heart failure, unspecified: Secondary | ICD-10-CM | POA: Diagnosis not present

## 2022-08-14 DIAGNOSIS — J410 Simple chronic bronchitis: Secondary | ICD-10-CM | POA: Diagnosis not present

## 2022-08-14 DIAGNOSIS — K769 Liver disease, unspecified: Secondary | ICD-10-CM | POA: Diagnosis not present

## 2022-08-14 DIAGNOSIS — I10 Essential (primary) hypertension: Secondary | ICD-10-CM | POA: Diagnosis not present

## 2022-09-05 ENCOUNTER — Other Ambulatory Visit (HOSPITAL_COMMUNITY): Payer: Self-pay | Admitting: Family Medicine

## 2022-09-18 ENCOUNTER — Other Ambulatory Visit (HOSPITAL_COMMUNITY): Payer: Self-pay | Admitting: Internal Medicine

## 2022-11-17 DIAGNOSIS — G4489 Other headache syndrome: Secondary | ICD-10-CM | POA: Diagnosis not present

## 2022-11-17 DIAGNOSIS — R0789 Other chest pain: Secondary | ICD-10-CM | POA: Diagnosis not present

## 2022-11-17 DIAGNOSIS — I10 Essential (primary) hypertension: Secondary | ICD-10-CM | POA: Diagnosis not present

## 2022-11-17 DIAGNOSIS — I4891 Unspecified atrial fibrillation: Secondary | ICD-10-CM | POA: Diagnosis not present

## 2022-11-17 DIAGNOSIS — R079 Chest pain, unspecified: Secondary | ICD-10-CM | POA: Diagnosis not present

## 2022-12-16 ENCOUNTER — Other Ambulatory Visit (HOSPITAL_COMMUNITY): Payer: Self-pay | Admitting: Family Medicine

## 2023-04-11 ENCOUNTER — Other Ambulatory Visit (HOSPITAL_COMMUNITY): Payer: Self-pay | Admitting: Family Medicine

## 2023-05-06 ENCOUNTER — Other Ambulatory Visit (HOSPITAL_COMMUNITY): Payer: Self-pay | Admitting: Internal Medicine

## 2023-05-14 DIAGNOSIS — E7800 Pure hypercholesterolemia, unspecified: Secondary | ICD-10-CM | POA: Diagnosis not present

## 2023-05-14 DIAGNOSIS — I480 Paroxysmal atrial fibrillation: Secondary | ICD-10-CM | POA: Diagnosis not present

## 2023-05-14 DIAGNOSIS — N182 Chronic kidney disease, stage 2 (mild): Secondary | ICD-10-CM | POA: Diagnosis not present

## 2023-05-14 DIAGNOSIS — R600 Localized edema: Secondary | ICD-10-CM | POA: Diagnosis not present

## 2023-05-14 DIAGNOSIS — Z720 Tobacco use: Secondary | ICD-10-CM | POA: Diagnosis not present

## 2023-05-14 DIAGNOSIS — I509 Heart failure, unspecified: Secondary | ICD-10-CM | POA: Diagnosis not present

## 2023-05-14 DIAGNOSIS — Z8673 Personal history of transient ischemic attack (TIA), and cerebral infarction without residual deficits: Secondary | ICD-10-CM | POA: Diagnosis not present

## 2023-05-14 DIAGNOSIS — J410 Simple chronic bronchitis: Secondary | ICD-10-CM | POA: Diagnosis not present

## 2023-05-14 DIAGNOSIS — R0602 Shortness of breath: Secondary | ICD-10-CM | POA: Diagnosis not present

## 2023-05-14 DIAGNOSIS — E785 Hyperlipidemia, unspecified: Secondary | ICD-10-CM | POA: Diagnosis not present

## 2023-05-14 DIAGNOSIS — I10 Essential (primary) hypertension: Secondary | ICD-10-CM | POA: Diagnosis not present

## 2023-05-14 DIAGNOSIS — I2510 Atherosclerotic heart disease of native coronary artery without angina pectoris: Secondary | ICD-10-CM | POA: Diagnosis not present

## 2023-05-21 DIAGNOSIS — K769 Liver disease, unspecified: Secondary | ICD-10-CM | POA: Diagnosis not present

## 2023-05-21 DIAGNOSIS — Z8673 Personal history of transient ischemic attack (TIA), and cerebral infarction without residual deficits: Secondary | ICD-10-CM | POA: Diagnosis not present

## 2023-05-21 DIAGNOSIS — I2510 Atherosclerotic heart disease of native coronary artery without angina pectoris: Secondary | ICD-10-CM | POA: Diagnosis not present

## 2023-05-21 DIAGNOSIS — I480 Paroxysmal atrial fibrillation: Secondary | ICD-10-CM | POA: Diagnosis not present

## 2023-05-21 DIAGNOSIS — I509 Heart failure, unspecified: Secondary | ICD-10-CM | POA: Diagnosis not present

## 2023-05-21 DIAGNOSIS — N182 Chronic kidney disease, stage 2 (mild): Secondary | ICD-10-CM | POA: Diagnosis not present

## 2023-05-21 DIAGNOSIS — J410 Simple chronic bronchitis: Secondary | ICD-10-CM | POA: Diagnosis not present

## 2023-05-21 DIAGNOSIS — I10 Essential (primary) hypertension: Secondary | ICD-10-CM | POA: Diagnosis not present

## 2023-05-21 DIAGNOSIS — R600 Localized edema: Secondary | ICD-10-CM | POA: Diagnosis not present

## 2023-05-21 DIAGNOSIS — K219 Gastro-esophageal reflux disease without esophagitis: Secondary | ICD-10-CM | POA: Diagnosis not present

## 2023-05-21 DIAGNOSIS — E7800 Pure hypercholesterolemia, unspecified: Secondary | ICD-10-CM | POA: Diagnosis not present

## 2023-05-21 DIAGNOSIS — K5900 Constipation, unspecified: Secondary | ICD-10-CM | POA: Diagnosis not present

## 2023-06-23 ENCOUNTER — Telehealth: Payer: Self-pay | Admitting: *Deleted

## 2023-06-23 DIAGNOSIS — H25813 Combined forms of age-related cataract, bilateral: Secondary | ICD-10-CM | POA: Diagnosis not present

## 2023-06-23 NOTE — Telephone Encounter (Signed)
Patient Name: Kevin [name]  DOB: [birth year] MRN: [id number]  Primary Cardiologist: None  Chart reviewed as part of pre-operative protocol coverage. Cataract extractions are recognized in guidelines as low risk surgeries that do not typically require specific preoperative testing or holding of blood thinner therapy. Therefore, given past medical history and time since last visit, based on ACC/AHA guidelines, [name] would be at acceptable risk for the planned procedure without further cardiovascular testing.   I will route this recommendation to the requesting party via Epic fax function and remove from pre-op pool.  Please call with questions.  Napoleon Form, [name], NP 06/23/2023, 5:57 PM

## 2023-06-23 NOTE — Telephone Encounter (Signed)
Pre-operative Risk Assessment    Patient Name: Kevin [name]  DOB: 04/20/1951 MRN: [id number]    DATE OF LAST VISIT: 02/12/22 DR. Gala Romney DATE OF NEXT VISIT: NONE  Request for Surgical Clearance    Procedure:   CATARACT EXTRACTION-LEFT EYE 07/03/23; TBD FOLLOWED BY THE RIGHT EYE  Date of Surgery:  Clearance 07/03/23                                 Surgeon:  DR. Mack Hook Surgeon's Group or Practice Name:  [facility] Phone number:  [phone] Fax number:  [phone]   Type of Clearance Requested:   - Medical ; NO MEDICATIONS LISTED AS NEEDING TO BE HELD   Type of Anesthesia:   IV SEDATION   Additional requests/questions:    Elpidio Anis   06/23/2023, 5:53 PM

## 2023-07-03 DIAGNOSIS — H25812 Combined forms of age-related cataract, left eye: Secondary | ICD-10-CM | POA: Diagnosis not present

## 2023-07-06 ENCOUNTER — Other Ambulatory Visit (HOSPITAL_COMMUNITY): Payer: Self-pay | Admitting: Internal Medicine

## 2023-07-08 DIAGNOSIS — K219 Gastro-esophageal reflux disease without esophagitis: Secondary | ICD-10-CM | POA: Diagnosis not present

## 2023-07-08 DIAGNOSIS — N1831 Chronic kidney disease, stage 3a: Secondary | ICD-10-CM | POA: Diagnosis not present

## 2023-07-08 DIAGNOSIS — I2510 Atherosclerotic heart disease of native coronary artery without angina pectoris: Secondary | ICD-10-CM | POA: Diagnosis not present

## 2023-07-08 DIAGNOSIS — Z0001 Encounter for general adult medical examination with abnormal findings: Secondary | ICD-10-CM | POA: Diagnosis not present

## 2023-07-08 DIAGNOSIS — Z23 Encounter for immunization: Secondary | ICD-10-CM | POA: Diagnosis not present

## 2023-07-08 DIAGNOSIS — I110 Hypertensive heart disease with heart failure: Secondary | ICD-10-CM | POA: Diagnosis not present

## 2023-07-08 DIAGNOSIS — I480 Paroxysmal atrial fibrillation: Secondary | ICD-10-CM | POA: Diagnosis not present

## 2023-07-08 DIAGNOSIS — E7800 Pure hypercholesterolemia, unspecified: Secondary | ICD-10-CM | POA: Diagnosis not present

## 2023-07-08 DIAGNOSIS — J410 Simple chronic bronchitis: Secondary | ICD-10-CM | POA: Diagnosis not present

## 2023-07-08 DIAGNOSIS — I5032 Chronic diastolic (congestive) heart failure: Secondary | ICD-10-CM | POA: Diagnosis not present

## 2023-07-22 ENCOUNTER — Ambulatory Visit: Payer: 59

## 2023-09-02 ENCOUNTER — Ambulatory Visit: Payer: 59 | Attending: Physician Assistant | Admitting: Physical Therapy

## 2023-09-02 DIAGNOSIS — R2689 Other abnormalities of gait and mobility: Secondary | ICD-10-CM | POA: Insufficient documentation

## 2023-09-02 DIAGNOSIS — R2681 Unsteadiness on feet: Secondary | ICD-10-CM | POA: Insufficient documentation

## 2023-09-02 DIAGNOSIS — M6281 Muscle weakness (generalized): Secondary | ICD-10-CM | POA: Diagnosis not present

## 2023-09-02 NOTE — Therapy (Unsigned)
OUTPATIENT PHYSICAL THERAPY WHEELCHAIR EVALUATION   Patient Name: Kevin [name] MRN: [id number] DOB:[birth year], 72 y.o., male Today's Date: 09/03/2023  END OF SESSION:  PT End of Session - 09/03/23 1554     Visit Number 1    Number of Visits 1    Authorization Type UHC Medicare/Medicaid    PT Start Time 1320    PT Stop Time 1425    PT Time Calculation (min) 65 min    Activity Tolerance Patient tolerated treatment well    Behavior During Therapy [facility] for tasks assessed/performed             Past Medical History:  Diagnosis Date   Atrial fibrillation, permanent (HCC) 02/01/2016   Blood transfusion without reported diagnosis    CAD in native artery cath 2016 90% mRamus  02/01/2016   CHF (congestive heart failure) (HCC)    Cholelithiases 05/01/2012   Claudication (HCC)    bilateral   Cocaine abuse (HCC)    Dysrhythmia    afib   ETOH abuse    Hepatitis C antibody test positive 04/2012   Hyperpigmentation    Hypertension    PAD (peripheral artery disease) (HCC)    Persistent atrial fibrillation (HCC) 08/23/2014   Thrombocytopenia (HCC)    Tobacco abuse    Past Surgical History:  Procedure Laterality Date   Cardiolite     negative for ischemia   CARDIOVASCULAR STRESS TEST     CARDIOVERSION  05/05/2012   Procedure: CARDIOVERSION;  Surgeon: Thurmon Fair, MD;  Location: [facility] ENDOSCOPY;  Service: Cardiovascular;  Laterality: N/A;   CAROTID ARTERY ANGIOPLASTY     DOPPLER ECHOCARDIOGRAPHY     ENDOVASCULAR STENT INSERTION  right leg   LEFT HEART CATHETERIZATION WITH CORONARY ANGIOGRAM N/A 05/01/2012   Procedure: LEFT HEART CATHETERIZATION WITH CORONARY ANGIOGRAM;  Surgeon: Marykay Lex, MD;  Location: [facility] CATH LAB;  Service: Cardiovascular;  Laterality: N/A;   LEFT HEART CATHETERIZATION WITH CORONARY ANGIOGRAM N/A 09/27/2014   Procedure: LEFT HEART CATHETERIZATION WITH CORONARY ANGIOGRAM;  Surgeon: Chrystie Nose, MD;  Location: [facility] CATH LAB;  Service: Cardiovascular;   Laterality: N/A;   TEE WITHOUT CARDIOVERSION  05/05/2012   Procedure: TRANSESOPHAGEAL ECHOCARDIOGRAM (TEE);  Surgeon: Thurmon Fair, MD;  Location: [facility] ENDOSCOPY;  Service: Cardiovascular;  Laterality: N/A;   Patient Active Problem List   Diagnosis Date Noted   Abnormality of gait 06/29/2020   Right hemiparesis (HCC)    Macrocytosis    Hyperglycemia    Slow transit constipation    Chronic systolic congestive heart failure (HCC)    Benign essential HTN    Cerebrovascular accident (CVA) due to embolism of cerebral artery (HCC) 03/03/2020   Acute focal neurological deficit 03/02/2020   Liver cirrhosis (HCC)    Metabolic encephalopathy    Chronic systolic CHF (congestive heart failure) (HCC)    Paroxysmal atrial fibrillation (HCC)    Essential hypertension    HCV antibody positive    Secondary hypertension, unspecified    Elevated troponin    Blood pressure instability    CAD in native artery cath 2016 90% mRamus  02/01/2016   Atrial fibrillation, permanent (HCC) 02/01/2016   NSTEMI (non-ST elevated myocardial infarction) (HCC)    Status epilepticus (HCC) 01/31/2016   High anion gap metabolic acidosis    Lactic acidosis    Cerebral infarction [facility])    Hypertensive emergency    Seizures (HCC)    Cerebral infarction (HCC)    TIA (transient ischemic attack) 08/23/2015   CKD (chronic  kidney disease) stage 3, GFR 30-59 ml/min (HCC) 08/23/2015   Acute ischemic stroke (HCC) 08/23/2015   Abnormal nuclear stress test 09/27/2014   H/O medication noncompliance 09/27/2014   Unstable angina (HCC) 09/27/2014   Persistent atrial fibrillation (HCC) 08/23/2014   Chronic diastolic heart failure (HCC) 08/23/2014   Polysubstance abuse (HCC) 08/23/2014   Erectile dysfunction 08/23/2014   Cirrhosis of liver (HCC) 04/23/2014   DOE (dyspnea on exertion) 04/19/2014   Atrial fibrillation with RVR (HCC) 04/19/2014   Dysphagia 04/19/2014   Obesity 05/02/2012   NSVT (nonsustained ventricular  tachycardia) (HCC) 05/02/2012   Thrombocytopenia (HCC) 05/02/2012   Cholelithiases 05/01/2012   Chest pain, no significant macrovascular CAD at cath 05/01/12; Likely due to elevated LVEDP & Afib with Diastolic HF. 04/30/2012   Atrial fibrillation with RVR, converted to SR with TEE and DCCV 05/05/12 04/30/2012   PAD (peripheral artery disease), PTA to Lt. SFA in 2009, total occlusion mid Rt SFA with collaterals  04/30/2012   Cocaine abuse (HCC) 04/30/2012   HTN (hypertension) -Accelerated with End organ involvement 04/30/2012   Tobacco abuse 04/30/2012   ETOH abuse 04/30/2012   Hepatitis C antibody test positive 04/23/2012    PCP: Jackie Plum, MD  REFERRING PROVIDER: Norm Salt, [place]  REFERRING DIAG: s/p Lt CVA June 2021  THERAPY DIAG:  Other abnormalities of gait and mobility  Muscle weakness (generalized)  Unsteadiness on feet  ONSET DATE: June 2021  Rationale for Evaluation and Treatment: Habilitation  SUBJECTIVE:                                                                                                                                                                                           SUBJECTIVE STATEMENT: Pt presents for power wheelchair eval - propellled by his wife; is using a transport wheelchair for mobility  PERTINENT HISTORY:  HTN, CAD with chronic systolic CHF, CKD, seizures, cirrhosis of the liver, chronic thrombocytopenia, polysubstance abuse, CAF-on Eliquis, prior TIA/CVA presents for hospital follow up after receiving CIR for Left ACA/MCA watershed infarct, small left basal ganglia and thalamic infarct, left parietal occipital infarct on 03/02/20.  PAIN:  Are you having pain? No  PRECAUTIONS: Fall  WEIGHT BEARING RESTRICTIONS: No  FALLS:  Has patient fallen in last 6 months? Yes. Number of falls 7  LIVING ENVIRONMENT: Lives with: lives with their spouse Lives in: House/apartment   PLOF: Requires assistive device for  independence  PATIENT GOALS: obtain power wheelchair for independence with mobility in his home environment   PATIENT INFORMATION: This Evaluation form will serve as the LMN for the following suppliers:  Supplier:  [employer] Contact Person:  Saddie Benders, [employer] Phone:  [phone]   Reason for Referral:  power wheelchair evaluation Patient/caregiver Goals:  obtain power wheelchair for mobility in the home Patient was seen for face-to-face evaluation for new power wheelchair.  Also present was [employer], [employer] with [employer], to discuss recommendations and wheelchair options.  Further paperwork was completed and sent to vendor.  Patient appears to qualify for power mobility device at this time per objective findings.    MEDICAL HISTORY: Diagnosis:  s/p CVA:  CHF:  Atrial fibrillation:  Seizures:  CKD stage 3  Primary Diagnosis Onset:  June 2021 [] Progressive Disease Relevant Past and Future Surgeries:  Lt heart catheterization 2013 & 2016:    Height: 5'9" Weight: 170# Explain and recent changes or trends in weight:   Relevant History including falls:   Pt presents to evaluation in a transport wheelchair, propelled by his wife  Pt sustained Left ACA/MCA watershed infarct, small left basal ganglia and thalamic infarct, left parietal occipital infarct on 03/02/20.  Pt was hospitalized from  03-02-20 - 03-06-20.  PMH also includes seizures and chronic CHF.  Pt has used RW for assistance with amb. Since CVA in 2021, however, wife reports decline in mobility and balance in past 4-6 months resulting in need for wheelchair to prevent falls. She states she has been renting transport wheelchair for him for past 4 months.  She reports pt has had approx. 7 falls within past 6 months.        HOME ENVIRONMENT: [x] House  [] Condo/town home  [] Apartment  [] Assisted Living    [] Lives Alone [x]  Lives with Others                                                    Hours with caregiver:   [x] Home is accessible to  patient            Stairs  [x] Yes []  No     Ramp [] Yes [] No Comments:  1 step to enter into home   COMMUNITY ADL: TRANSPORTATION: [x] Car    [] Administrator, arts    [] Adapted w/c Lift   [] Ambulance   [] Other:       [] Sits in wheelchair during transport  Employment/School:     Specific requirements pertaining to mobility                                                     Other:                                       FUNCTIONAL/SENSORY PROCESSING SKILLS:  Handedness:   [] Right     [x] Left    [] NA  Comments:                                 Functional Processing Skills for Wheeled Mobility [x] Processing Skills are adequate for safe wheelchair operation  Areas of concern than may interfere with safe operation of wheelchair Description of problem   []  Attention to environment     [] Judgment     []   Hearing  []  Vision or visual processing    [] Motor Planning  []  Fluctuations in Behavior                                                   VERBAL COMMUNICATION: [x] WFL receptive [x]  WFL expressive [] Understandable  [] Difficult to understand  [] non-communicative []  Uses an augmented communication device    CURRENT SEATING / MOBILITY: Current Mobility Base:   [] None  [] Dependent  [x] Manual  - has been renting manual w/c for 4 months; used RW prior to obtaining w/c  [] Scooter  [] Power   Type of Control:                       Manufacturer:                         Size:    18x18                     Age:                           Current Condition of Mobility Base:                                                                                                                     Current Wheelchair components:                                                                                                                                   Describe posture in present seating system:                                                                            SENSATION and  SKIN ISSUES: Sensation [] Intact [x] Impaired [] Absent   Level of sensation:    Pt reports tingling in bil. feet  Pressure Relief: Able to perform effective pressure relief :   [x] Yes  []  No Method:                                                                              If not, Why?:                                                                          Skin Issues/Skin Integrity Current Skin Issues   [] Yes [x] No  [] Intact []  Red area []  Open Area  [] Scar Tissue [] At risk from prolonged sitting  Where                              History of Skin Issues   [] Yes [x] No  Where                                         When                                               Hx of skin flap surgeries [] Yes [x] No  Where                  When                                                  Limited sitting tolerance [] Yes [x] No Hours spent sitting in wheelchair daily:   8+                                                  Complaint of Pain:  Please describe:       None                                                                                                      Swelling/Edema:  edema in bil. feet  ADL STATUS (in reference to wheelchair use):  Indep Assist Unable Indep with Equip Not assessed Comments  Dressing              X                                          Needs assist with lower body dressing - dresses from wheelchair                 Eating     X                                                                                                                         Toileting                X                                                   Needs assistance with transfers: uses elevated commode seat and grab bars                                                            Bathing                          X                                     Uses shower seat; needs assist with transfer                                                                      Grooming/ Hygiene                X                                               Performs in seated position  Meal Prep                              X                                                                                            IADLS                        X                                                                                       Bowel Management: [] Continent  [] Incontinent  [x] Accidents Comments:                                                  Bladder Management: [] Continent  [] Incontinent  [x] Accidents Comments:                                              WHEELCHAIR SKILLS: Manual w/c Propulsion: [] UE or LE strength and endurance sufficient to participate in ADLs using manual wheelchair Arm :  [] left [] right  [] Both                                   Foot:   [] left [] right  [] Both  Distance:   Operate Scooter: []  Strength, hand grip, balance and transfer appropriate for use [] Living environment is accessible for use of scooter  Operate Power w/c:  [x]  Std. Joystick   []  Alternative Controls Indep [x]  Assist [x]  Dependent/ Unable []  N/A []  [x] Safe          [x]  Functional      Distance:  50'              Bed confined without wheelchair []  Yes [x]  No   STRENGTH/RANGE OF MOTION:  Active Range of Motion Strength  Shoulder      WFL's bil. UE's                                            Rt shoulder flexors & abdct. 3+/5:  Lt shoulder flexors & abdct. 4/5  Elbow       WFL's bil. UE's                                                5/5 bil. Elbow flexors & extensors                                                          Wrist/Hand       WNL's bil. UE's                                                             5/5 bil. Wirst & hand musc.                                                                      Hip       WFL's bil. LE's                                                           Bil. Hip flexors 3+/5                                                              Knee       WFL's bil. LE's                                                     Rt knee extensors 4/5: Lt knee extensors 5/5                                                                 Ankle       WFL's bil. LE's   Bil. Dorsiflexors 4/5  MOBILITY/BALANCE:  []  Patient is totally dependent for mobility                                                                                               Balance Transfers Ambulation  Sitting Balance: Standing Balance: []  Independent []  Independent/Modified Independent  [x]  WFL     []  WFL []  Supervision []  Supervision  []  Uses UE for balance  []  Supervision [x]  Min Assist (for safety)  [x]  Ambulates with Assist                           []  Min Assist [x]  Min assist []  Mod Assist [x]  Ambulates with Device:  [x]  RW   []  StW   []  Cane   []                 []  Mod Assist []  Mod assist []  Max assist   []  Max Assist []  Max assist []  Dependent []  Indep. Short Distance Only  []  Unable []  Unable []  Lift / Sling Required Distance (in feet)  15'                           []  Sliding board []  Unable to Ambulate: (Explain:  Cardio Status:  [] Intact  [x]  Impaired   []  NA     pt has CHF, NSVT                         Respiratory Status:  [] Intact   [x] Impaired   [] NA    DOE                                 Orthotics/Prosthetics:      None                                                                   Comments (Address manual vs power w/c vs scooter):  Pt is unable to safely and functionally ambulate due to gait and balance deficits due to CVA sustained in June 2021; he is unable to stand unsupported due to balance deficits and  also due to decreased endurance/activity tolerance due to chronic CHF, NSVT, and atrial fibrillation.  He fatigues quickly and has DOE.  Pt is unable to functionally and effectively propel a manual wheelchair due to his cardiac issues, including CHF, NSVT, and atrial fibrillation.  He has had numerous falls, requiring EMS assistance to transfer off the floor.  He is in need of a power wheelchair (Group 2) to allow him to safely access his home environment with reduced fall risk.  A power wheelchair would increase independence with mobility as well as ADL's and self care activities.  He is unable to use a  scooter due to his inability to safely transfer on and off the platform of a scooter.  A group 2 power wheelchair is a medical necessity for  him at this time.                                                 Anterior / Posterior Obliquity Rotation-Pelvis  PELVIS    [x] Neutral  []  Posterior  []  Anterior     [x] WFL  [] Right Elevated  [] Left Elevated   [x] WFL  [] Right Anterior []   Left Anterior    []  Fixed []  Partly Flexible [x]  Flexible  []  Other  []  Fixed  []  Partly Flexible  [x]  Flexible []  Other  []  Fixed  []  Partly Flexible  [x]  Flexible []  Other  TRUNK [x] WFL [] Thoracic Kyphosis [] Lumbar Lordosis   [x]  WFL [] Convex Right [] Convex Left   [] c-curve [] s-curve [] multiple  [x]  Neutral []  Left-anterior []  Right-anterior    []  Fixed [x]  Flexible []  Partly Flexible       Other  []  Fixed [x]  Flexible []  Partly Flexible []  Other  []  Fixed           [x]  Flexible []  Partly Flexible []  Other   Position Windswept   HIPS  []  Neutral [x]  Abduct []  ADduct [x]  Neutral []  Right []  Left       []  Fixed  []  Partly Flexible             []  Dislocated [x]  Flexible []  Subluxed    []  Fixed []  Partly Flexible  [x]  Flexible []  Other              Foot Positioning Knee Positioning   Knees and  Feet  [x]  WFL [x] Left [x] Right [x]  WFL [x] Left [x] Right   KNEES ROM  concerns: ROM concerns:   & Dorsi-Flexed                    [] Lt [] Rt                                  FEET Plantar Flexed                  [] Lt [] Rt     Inversion                    [] Lt [] Rt     Eversion                    [] Lt [] Rt    HEAD [x]  Functional [x]  Good Head Control   & []  Flexed         []  Extended []  Adequate Head Control   NECK []  Rotated  Lt  []  Lat Flexed Lt []  Rotated  Rt []  Lat Flexed Rt []  Limited Head Control    []  Cervical Hyperextension []  Absent  Head Control    SHOULDERS ELBOWS WRIST& HAND         Left     Right    Left     Right  U/E [x] Functional  Left            [x] Functional  Right                                 []   Fisting             [] Fisting     [] elevated Left [] depressed  Left [] elevated Right [] depressed  Right      [] protracted Left [] retracted Left [] protracted Right [] retracted Right [] subluxed  Left              [] subluxed  Right         Goals for Wheelchair Mobility  [x]  Independence with mobility in the home with motor related ADLs (MRADLs)  []  Independence with MRADLs in the community []  Provide dependent mobility  []  Provide recline     [] Provide tilt   Goals for Seating system [x]  Optimize pressure distribution [x]  Provide support needed to facilitate function or safety []  Provide corrective forces to assist with maintaining or improving posture []  Accommodate client's posture: current seated postures and positions are not flexible or will not tolerate corrective forces []  Client to be independent with relieving pressure in the wheelchair [] Enhance physiological function such as breathing, swallowing, digestion  Simulation ideas/Equipment trials:                                                                                                State why other equipment was unsuccessful:                                                                           MOBILITY BASE RECOMMENDATIONS and JUSTIFICATION: MOBILITY COMPONENT  JUSTIFICATION  Manufacturer:           Model:  Systems developer            Size: Width  18"      Seat Depth   20"        [x] provide transport from point A to B [x] promote Indep mobility  [x] is not a safe, functional ambulator [x] walker or cane inadequate [] non-standard width/depth necessary to accommodate anatomical measurement []                             [] Manual Mobility Base [] non-functional ambulator    [] Scooter/POV  [] can safely operate  [] can safely transfer   [] has adequate trunk stability  [] cannot functionally propel manual w/c  [x] Power Mobility Base  [] non-ambulatory  [x] cannot functionally propel manual wheelchair  [x]  cannot functionally and safely operate scooter/POV [x] can safely operate and willing to  [] Stroller Base [] infant/child  [] unable to propel manual wheelchair [] allows for growth [] non-functional ambulator [] non-functional UE [] Indep mobility is not a goal at this time  [] Tilt  [] Forward                   [] Backward                  [] Powered tilt              []   Manual tilt  [] change position against gravitational force on head and shoulders  [] change position for pressure relief/cannot weight shift [] transfers  [] management of tone [] rest periods [] control edema [] facilitate postural control  []                                       [] Recline  [] Power recline on power base [] Manual recline on manual base  [] accommodate femur to back angle  [] bring to full recline for ADL care  [] change position for pressure relief/cannot weight shift [] rest periods [] repositioning for transfers or clothing/diaper /catheter changes [] head positioning  [] Lighter weight required [] self- propulsion  [] lifting []                                                 [] Heavy Duty required [] user weight greater than 250# [] extreme tone/ over active movement [] broken frame on previous chair []                                     [x]  Back  []  Angle Adjustable []  Custom molded         Captain's seat                   [] postural control [] control of tone/spasticity [] accommodation of range of motion [] UE functional control [x] accommodation for seating system []                                          [] provide lateral trunk support [] accommodate deformity [x] provide posterior trunk support [x] provide lumbar/sacral support [x] support trunk in midline [x] Pressure relief over spinal processes  [x]  Seat Cushion     Captain's seat                  [] impaired sensation  [] decubitus ulcers present [] history of pressure ulceration [] prevent pelvic extension [x] low maintenance  [] stabilize pelvis  [] accommodate obliquity [] accommodate multiple deformity [x] neutralize lower extremity position [x] increase pressure distribution []                                           []  Pelvic/thigh support  []  Lateral thigh guide []  Distal medial pad  []  Distal lateral pad []  pelvis in neutral [] accommodate pelvis []  position upper legs []  alignment []  accommodate ROM []  decrease adduction [] accommodate tone [] removable for transfers [] decrease abduction  []  Lateral trunk Supports []  Lt     []  Rt [] decrease lateral trunk leaning [] control tone [] contour for increased contact [] safety  [] accommodate asymmetry []                                                 []  Mounting hardware  [] lateral trunk supports  [] back   [] seat [] headrest      []  thigh support [] fixed   [] swing away [] attach seat platform/cushion to w/c frame [] attach back cushion to w/c frame [] mount postural  supports [] mount headrest  [] swing medial thigh support away [] swing lateral supports away for transfers  []                                                     Armrests  [] fixed [x] adjustable height [] removable   [] swing away  [x] flip back   [] reclining [x] full length pads [] desk    [] pads tubular  [x] provide support with elbow at 90   [] provide support for w/c tray [] change of height/angles  for variable activities [] remove for transfers [x] allow to come closer to table top [] remove for access to tables []                                               Hangers/ Leg rests  [] 60 [] 70 [] 90 [] elevating [] heavy duty  [] articulating [] fixed [] lift off [] swing away     [] power [] provide LE support  [] accommodate to hamstring tightness [] elevate legs during recline   [] provide change in position for Legs [] Maintain placement of feet on footplate [] durability [] enable transfers [] decrease edema [] Accommodate lower leg length []                                         Foot support Footplate    [] Lt  []  Rt  [x]  Center mount [x] flip up                            [x] depth/angle adjustable [] Amputee adapter    []  Lt     []  Rt [x] provide foot support [x] accommodate to ankle ROM [x] transfers [] Provide support for residual extremity []  allow foot to go under wheelchair base []  decrease tone  []                                                 []  Ankle strap/heel loops [] support foot on foot support [] decrease extraneous movement [] provide input to heel  [] protect foot  Tires: [] pneumatic  [x] flat free inserts  [] solid  [x] decrease maintenance  [x] prevent frequent flats [] increase shock absorbency [] decrease pain from road shock [] decrease spasms from road shock []                                              []  Headrest  [] provide posterior head support [] provide posterior neck support [] provide lateral head support [] provide anterior head support [] support during tilt and recline [] improve feeding   [] improve respiration [] placement of switches [] safety  [] accommodate ROM  [] accommodate tone [] improve visual orientation  []  Anterior chest strap []  Vest []  Shoulder retractors  [] decrease forward movement of shoulder [] accommodation of TLSO [] decrease forward movement of trunk [] decrease shoulder elevation [] added abdominal support [] alignment [] assistance with shoulder  control  []   Pelvic Positioner [x] Belt [] SubASIS bar [] Dual Pull [] stabilize tone [x] decrease falling out of chair/ **will not Decrease potential for sliding due to pelvic tilting [] prevent excessive rotation [] pad for protection over boney prominence [] prominence comfort [] special pull angle to control rotation []                                                  Upper ExtremitySupport  [] L   []  R [] Arm trough   [] hand support []  tray       [] full tray [] swivel mount [] decrease edema      [] decrease subluxation   [] control tone   [] placement for AAC/Computer/EADL [] decrease gravitational pull on shoulders [] provide midline positioning [] provide support to increase UE function [] provide hand support in natural position [] provide work surface   POWER WHEELCHAIR CONTROLS  [x] Proportional  [] Non-Proportional Type                                      [x] Left  [] Right [x] provides access for controlling wheelchair   [] lacks motor control to operate proportional drive control [] unable to understand proportional controls  Actuator Control Module  [] Single  [] Multiple   [] Allow the client to operate the power seat function(s) through the joystick control   [] Safety Reset Switches [] Used to change modes and stop the wheelchair when driving in latch mode    [] Therapist, sports for accurate control [] progressive Disease/changing condition [] non-proportional drive control needed [] Needed in order to operate power seat functions through joystick control   [] Display box [] Allows user to see in which mode and drive the wheelchair is set  [] necessary for alternate controls    [] Digital interface electronics [] Allows w/c to operate when using alternative drive controls  [] ASL Head Array [] Allows client to operate wheelchair  through switches placed in tri-panel headrest  [] Sip and puff with tubing kit [] needed to operate sip and  puff drive controls  [] Upgraded tracking electronics [] increase safety when driving [] correct tracking when on uneven surfaces  [x] Mount for switches or joystick [] Attaches switches to w/c  [x] Swing away for access or transfers [] midline for optimal placement [] provides for consistent access  [] Attendant controlled joystick plus mount [] safety [] long distance driving [] operation of seat functions [] compliance with transportation regulations []                                             Rear wheel placement/Axle adjustability [] None [] semi adjustable [] fully adjustable  [] improved UE access to wheels [] improved stability [] changing angle in space for improvement of postural stability [] 1-arm drive access [] amputee pad placement []                                Wheel rims/ hand rims  [] metal   [] plastic coated [] oblique projections           [] vertical projections [] Provide ability to propel manual wheelchair  []  Increase self-propulsion with hand weakness/decreased grasp  Push handles [] extended   [] angle adjustable              [] standard [] caregiver access [] caregiver assist [] allows "hooking" to enable increased ability  to perform ADLs or maintain balance  One armed device   [] Lt   [] Rt [] enable propulsion of manual wheelchair with one arm   []                                            Brake/wheel lock extension []  Lt   []  Rt [] increase indep in applying wheel locks   [] Side guards [] prevent clothing getting caught in wheel or becoming soiled []  prevent skin tears/abrasions  Battery: U1 x 2                                    [x] to power wheelchair                                                         Other:                                                                                                                        The above equipment has a life- long use expectancy. Growth and changes in medical and/or functional conditions would be the exceptions. This is to certify  that the therapist has no financial relationship with durable medical provider or manufacturer. The therapist will not receive remuneration of any kind for the equipment recommended in this evaluation.   Patient has mobility limitation that significantly impairs safe, timely participation in one or more mobility related ADL's. (bathing, toileting, feeding, dressing, grooming, moving from room to room)  [x]  Yes []  No  Will mobility device sufficiently improve ability to participate and/or be aided in participation of MRADL's?      [x]  Yes []  No  Can limitation be compensated for with use of a cane or walker?                                    []  Yes [x]  No  Does patient or caregiver demonstrate ability/potential ability & willingness to safely use the mobility device?    [x]  Yes []  No  Does patient's home environment support use of recommended mobility device?            [x]  Yes []  No  Does patient have sufficient upper extremity function necessary to functionally propel a manual wheelchair?     []  Yes [x]  No  Does patient have sufficient strength and trunk stability to safely operate a POV (scooter)?                                  []   Yes [x]  No  Does patient need additional features/benefits provided by a power wheelchair for MRADL's in the home?        [x]  Yes []  No  Does the patient demonstrate the ability to safely use a power wheelchair?                   [x]  Yes []  No     Physician's Name Printed:   Norm Salt, PA                                                     Physician's Signature:  Date:     This is to certify that I, the above signed therapist have the following affiliations: []  This DME provider []  Manufacturer of recommended equipment []  Patient's long term care facility [x]  None of the above  Therapist Name/Signature:   Kerry Fort, PT                                         Date:  09-02-23  ASSESSMENT:  CLINICAL IMPRESSION: Patient is a 72  y.o. 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  Recommend group 2 power wheelchair with captain's seat for independence and safety with mobility in his home environment.    PLAN:  PT FREQUENCY: one time visit  PT DURATION: 1 week  PLANNED INTERVENTIONS:  initial eval only for power wheelchair .    Kary Kos, PT 09/03/2023, 3:58 PM

## 2023-09-03 ENCOUNTER — Encounter: Payer: Self-pay | Admitting: Physical Therapy

## 2023-09-11 DIAGNOSIS — I2510 Atherosclerotic heart disease of native coronary artery without angina pectoris: Secondary | ICD-10-CM | POA: Diagnosis not present

## 2023-09-11 DIAGNOSIS — I110 Hypertensive heart disease with heart failure: Secondary | ICD-10-CM | POA: Diagnosis not present

## 2023-09-11 DIAGNOSIS — I130 Hypertensive heart and chronic kidney disease with heart failure and stage 1 through stage 4 chronic kidney disease, or unspecified chronic kidney disease: Secondary | ICD-10-CM | POA: Diagnosis not present

## 2023-09-11 DIAGNOSIS — J410 Simple chronic bronchitis: Secondary | ICD-10-CM | POA: Diagnosis not present

## 2023-09-11 DIAGNOSIS — E7800 Pure hypercholesterolemia, unspecified: Secondary | ICD-10-CM | POA: Diagnosis not present

## 2023-09-11 DIAGNOSIS — I480 Paroxysmal atrial fibrillation: Secondary | ICD-10-CM | POA: Diagnosis not present

## 2023-09-11 DIAGNOSIS — N1831 Chronic kidney disease, stage 3a: Secondary | ICD-10-CM | POA: Diagnosis not present

## 2023-09-11 DIAGNOSIS — I5032 Chronic diastolic (congestive) heart failure: Secondary | ICD-10-CM | POA: Diagnosis not present

## 2023-09-11 DIAGNOSIS — K219 Gastro-esophageal reflux disease without esophagitis: Secondary | ICD-10-CM | POA: Diagnosis not present

## 2023-09-12 DIAGNOSIS — I5032 Chronic diastolic (congestive) heart failure: Secondary | ICD-10-CM | POA: Diagnosis not present

## 2023-09-12 DIAGNOSIS — I110 Hypertensive heart disease with heart failure: Secondary | ICD-10-CM | POA: Diagnosis not present

## 2023-09-12 DIAGNOSIS — E7800 Pure hypercholesterolemia, unspecified: Secondary | ICD-10-CM | POA: Diagnosis not present

## 2023-09-12 DIAGNOSIS — I130 Hypertensive heart and chronic kidney disease with heart failure and stage 1 through stage 4 chronic kidney disease, or unspecified chronic kidney disease: Secondary | ICD-10-CM | POA: Diagnosis not present

## 2023-09-12 DIAGNOSIS — J410 Simple chronic bronchitis: Secondary | ICD-10-CM | POA: Diagnosis not present

## 2023-10-11 ENCOUNTER — Other Ambulatory Visit (HOSPITAL_COMMUNITY): Payer: Self-pay | Admitting: Internal Medicine

## 2023-11-11 ENCOUNTER — Other Ambulatory Visit (HOSPITAL_COMMUNITY): Payer: Self-pay | Admitting: Internal Medicine

## 2023-11-24 ENCOUNTER — Other Ambulatory Visit: Payer: Self-pay

## 2023-11-24 ENCOUNTER — Encounter (HOSPITAL_COMMUNITY): Payer: Self-pay

## 2023-11-24 ENCOUNTER — Observation Stay (HOSPITAL_COMMUNITY)
Admission: EM | Admit: 2023-11-24 | Discharge: 2023-11-27 | Disposition: A | Discharge status: Home / Self Care | Attending: Internal Medicine | Admitting: Internal Medicine

## 2023-11-24 ENCOUNTER — Emergency Department (HOSPITAL_COMMUNITY)

## 2023-11-24 DIAGNOSIS — Z79899 Other long term (current) drug therapy: Secondary | ICD-10-CM | POA: Diagnosis not present

## 2023-11-24 DIAGNOSIS — E875 Hyperkalemia: Secondary | ICD-10-CM | POA: Diagnosis not present

## 2023-11-24 DIAGNOSIS — I700 Atherosclerosis of aorta: Secondary | ICD-10-CM | POA: Diagnosis not present

## 2023-11-24 DIAGNOSIS — I739 Peripheral vascular disease, unspecified: Secondary | ICD-10-CM | POA: Insufficient documentation

## 2023-11-24 DIAGNOSIS — I4581 Long QT syndrome: Secondary | ICD-10-CM | POA: Insufficient documentation

## 2023-11-24 DIAGNOSIS — Z8679 Personal history of other diseases of the circulatory system: Secondary | ICD-10-CM | POA: Insufficient documentation

## 2023-11-24 DIAGNOSIS — I4891 Unspecified atrial fibrillation: Secondary | ICD-10-CM | POA: Diagnosis not present

## 2023-11-24 DIAGNOSIS — Z8659 Personal history of other mental and behavioral disorders: Secondary | ICD-10-CM | POA: Diagnosis not present

## 2023-11-24 DIAGNOSIS — R079 Chest pain, unspecified: Secondary | ICD-10-CM | POA: Diagnosis not present

## 2023-11-24 DIAGNOSIS — I25119 Atherosclerotic heart disease of native coronary artery with unspecified angina pectoris: Secondary | ICD-10-CM | POA: Insufficient documentation

## 2023-11-24 DIAGNOSIS — G459 Transient cerebral ischemic attack, unspecified: Secondary | ICD-10-CM | POA: Diagnosis not present

## 2023-11-24 DIAGNOSIS — K219 Gastro-esophageal reflux disease without esophagitis: Secondary | ICD-10-CM | POA: Insufficient documentation

## 2023-11-24 DIAGNOSIS — Z7901 Long term (current) use of anticoagulants: Secondary | ICD-10-CM | POA: Insufficient documentation

## 2023-11-24 DIAGNOSIS — Z87891 Personal history of nicotine dependence: Secondary | ICD-10-CM | POA: Diagnosis not present

## 2023-11-24 DIAGNOSIS — Z7409 Other reduced mobility: Secondary | ICD-10-CM | POA: Insufficient documentation

## 2023-11-24 DIAGNOSIS — R0789 Other chest pain: Secondary | ICD-10-CM | POA: Diagnosis not present

## 2023-11-24 DIAGNOSIS — I119 Hypertensive heart disease without heart failure: Secondary | ICD-10-CM | POA: Diagnosis not present

## 2023-11-24 DIAGNOSIS — I4510 Unspecified right bundle-branch block: Secondary | ICD-10-CM | POA: Diagnosis not present

## 2023-11-24 DIAGNOSIS — I499 Cardiac arrhythmia, unspecified: Secondary | ICD-10-CM | POA: Diagnosis not present

## 2023-11-24 DIAGNOSIS — E785 Hyperlipidemia, unspecified: Secondary | ICD-10-CM | POA: Insufficient documentation

## 2023-11-24 DIAGNOSIS — R600 Localized edema: Secondary | ICD-10-CM | POA: Diagnosis not present

## 2023-11-24 DIAGNOSIS — R7989 Other specified abnormal findings of blood chemistry: Secondary | ICD-10-CM | POA: Insufficient documentation

## 2023-11-24 DIAGNOSIS — R9389 Abnormal findings on diagnostic imaging of other specified body structures: Secondary | ICD-10-CM | POA: Diagnosis not present

## 2023-11-24 LAB — TROPONIN I (HIGH SENSITIVITY): Troponin I (High Sensitivity): 65 ng/L — ABNORMAL HIGH (ref <18)

## 2023-11-24 LAB — BASIC METABOLIC PANEL
Anion gap: 8 (ref 5–15)
BUN: 17 mg/dL (ref 8–23)
CO2: 25 mmol/L (ref 22–32)
Calcium: 8.4 mg/dL — ABNORMAL LOW (ref 8.9–10.3)
Chloride: 103 mmol/L (ref 98–111)
Creatinine, Ser: 1.32 mg/dL — ABNORMAL HIGH (ref 0.61–1.24)
GFR, Estimated: 57 mL/min — ABNORMAL LOW (ref >60)
Glucose, Bld: 142 mg/dL — ABNORMAL HIGH (ref 70–99)
Potassium: 5.4 mmol/L — ABNORMAL HIGH (ref 3.5–5.1)
Sodium: 136 mmol/L (ref 135–145)

## 2023-11-24 NOTE — ED Provider Notes (Incomplete)
 [place] EMERGENCY DEPARTMENT AT [facility] Provider Note   CSN: [id number] Arrival date & time: 11/24/23  2140     History {Add pertinent medical, surgical, social history, OB history to HPI:1} Chief Complaint  Patient presents with  . Chest Pain    Kevin [name] is a 73 y.o. male history of A-fib on Eliquis, cholelithiasis, CAD, cocaine use, claudication, CHF, thrombocytopenia, PAD, hypertension, alcohol use, NSVT presented for chest pain.  Triage note states the chest pain began this evening however patient states that his chest pain has been going on for months.  Patient states it comes and goes without obvious trigger or alleviating factors.  Patient states he has been compliant with his Eliquis and his medications but states his legs do look swollen but that this also comes and goes.  Patient denies any shortness of breath.  Patient states the chest pain is substernal but does not radiate.  Per EMS patient was initially diaphoretic according to the wife and was given aspirin and 1 nitro and the pain resolved.  Patient does not endorse this with me.  Patient does have history of dementia.  Home Medications Prior to Admission medications   Medication Sig Start Date End Date Taking? Authorizing Provider  albuterol (VENTOLIN HFA) 108 (90 Base) MCG/ACT inhaler Inhale 2 puffs into the lungs every 6 (six) hours as needed for wheezing or shortness of breath. 01/17/20   Olalere, Adewale A, MD  amLODipine (NORVASC) 5 MG tablet Take 5 mg by mouth daily.    [provider]  apixaban (ELIQUIS) 5 MG TABS tablet Take 1 tablet (5 mg total) by mouth 2 (two) times daily. NEEDS FOLLOW UP APPOINTMENT FOR MORE REFILLS 05/07/23   Bensimhon, Bevelyn Buckles, MD  carvedilol (COREG) 12.5 MG tablet TAKE 1.5 TABLETS (18.75 MG TOTAL) BY MOUTH 2 (TWO) TIMES DAILY WITH A MEAL. 04/11/23   Bensimhon, Bevelyn Buckles, MD  ENTRESTO 97-103 MG TAKE 1 TABLET BY MOUTH 2 (TWO) TIMES DAILY. NEEDS FOLLOW UP  APPOINTMENT FOR MORE REFILLS 11/11/23   Bensimhon, Bevelyn Buckles, MD  JARDIANCE 10 MG TABS tablet TAKE 1 TABLET BY MOUTH EVERY DAY BEFORE BREAKFAST 09/18/22   Bensimhon, Bevelyn Buckles, MD  Multiple Vitamin (MULTIVITAMIN WITH MINERALS) TABS tablet Take 1 tablet by mouth daily. Men's One a Day    [provider]  rosuvastatin (CRESTOR) 20 MG tablet TAKE 1 TABLET BY MOUTH EVERY DAY 09/05/22   Jacklynn Ganong, FNP  spironolactone (ALDACTONE) 25 MG tablet TAKE 1/2 TABLET BY MOUTH EVERY DAY 12/17/22   [place], Anderson Malta, FNP  TRELEGY ELLIPTA 100-62.5-25 MCG/INH AEPB Take 1 puff by mouth daily. 01/19/20   [provider]      Allergies    Patient has no known allergies.    Review of Systems   Review of Systems  Cardiovascular:  Positive for chest pain.    Physical Exam Updated Vital Signs BP (!) 148/95   Pulse (!) 109   Temp 99 F (37.2 C) (Oral)   Resp 15   Ht 6' (1.829 m)   Wt 95.3 kg   SpO2 93%   BMI 28.49 kg/m  Physical Exam Constitutional:      General: He is not in acute distress. Cardiovascular:     Rate and Rhythm: Tachycardia present. Rhythm irregular.     Pulses: Normal pulses.     Heart sounds: Normal heart sounds.  Pulmonary:     Effort: Pulmonary effort is normal. No respiratory distress.  Breath sounds: Normal breath sounds.     Comments: Able to speak in full sentences Abdominal:     Palpations: Abdomen is soft.     Tenderness: There is no abdominal tenderness. There is no guarding or rebound.  Musculoskeletal:     Comments: 2+ bilateral pitting edema  Skin:    General: Skin is warm and dry.  Neurological:     Mental Status: He is alert.  Psychiatric:        Mood and Affect: Mood normal.     ED Results / Procedures / Treatments   Labs (all labs ordered are listed, but only abnormal results are displayed) Labs Reviewed  BASIC METABOLIC PANEL  CBC  BRAIN NATRIURETIC PEPTIDE  TROPONIN I (HIGH SENSITIVITY)    EKG None  Radiology No  results found.  Procedures Procedures  {Document cardiac monitor, telemetry assessment procedure when appropriate:1}  Medications Ordered in ED Medications - No data to display  ED Course/ Medical Decision Making/ A&P   {   Click here for ABCD2, HEART and other calculatorsREFRESH Note before signing :1}                              Medical Decision Making Amount and/or Complexity of Data Reviewed Labs: ordered. Radiology: ordered.   [name] 73 y.o. presented today for chest pain. Working DDx that I considered at this time includes, but not limited to, ACS, pulmonary embolism, community-acquired pneumonia, aortic dissection, pneumothorax, underlying bony abnormality, anemia, thyrotoxicosis, HTN urgency/emergency, esophageal rupture, CHF exacerbation, valvular disorder, myocarditis, pericarditis, endocarditis, pericardial effusion/cardiac tamponade, pulmonary edema, gastritis/PUD/GERD, esophagitis, MSK.  R/o Dx: ***: These are considered less likely due to history of present illness and physical exam findings. *** PE: CTA negative ***D-dimer negative ***Well's Score is *** and so advanced imaging will not be ordered as alternative diagnoses are more likely at this time Aortic Dissection: less likely based on the location, quality, onset, and severity of symptoms in this case. Patient also has a lack of underlying history of AD or TAA.   Review of prior external notes: ***  Unique Tests and My Interpretation:  EKG: Rate, rhythm, axis, intervals all examined and without medically relevant abnormality. ST segments without concerns for elevations Troponin: *** CXR: *** CBC: *** BMP: ***  Social Determinants of Health: {social:31487}  Discussion with Independent Historian: {historian:29369}  Discussion of Management of Tests: {historian:29369}  Risk: {Risk:29370}  Risk Stratification Score: HEART:  Staffed with Silverio Lay, MD  Plan: On exam patient was in no acute  distress but was noted to be mildly tachycardic at 101 in the room with A-fib. Patient's physical was remarkable for 2+ pitting edema bilaterally. Labs and CXR will be ordered.  The cardiac monitor was ordered secondary to the patient's history of chest pain and to monitor the patient for dysrhythmia. The cardiac monitor revealed A-fib as interpreted by me. Patient stable at this time.  I tried to contact the wife however nobody answered at this time so we will try again as there does seem to be described seen in the triage note versus what the patient told me which could be due to patient's dementia.  Patient was given return precautions. Patient stable for discharge at this time.  Patient verbalized understanding of plan.  Discussed smoking cessation with patient and they were offered resources to help stop.  Total time was 5 min CPT code 16109.   This chart was dictated  using voice recognition software.  Despite best efforts to proofread,  errors can occur which can change the documentation meaning.   {Document critical care time when appropriate:1} {Document review of labs and clinical decision tools ie heart score, Chads2Vasc2 etc:1}  {Document your independent review of radiology images, and any outside records:1} {Document your discussion with family members, caretakers, and with consultants:1} {Document social determinants of health affecting pt's care:1} {Document your decision making why or why not admission, treatments were needed:1} Final Clinical Impression(s) / ED Diagnoses Final diagnoses:  None    Rx / DC Orders ED Discharge Orders     None

## 2023-11-24 NOTE — ED Triage Notes (Signed)
 PT BIB [facility] from home after a c/o sudden midsternal cp that radiates to the back and was rated 2/10.Wife states that he was diaphoretic and has no n/v/d or sob.PT was given 324 ASA and 1 nitroglycerin and pain resolved to 0/10. PT was in afib/rvr running in the 120's, PT is a+o x4 but has a hx of dementia.

## 2023-11-24 NOTE — ED Notes (Signed)
 Estelle-wife [phone]

## 2023-11-24 NOTE — ED Provider Notes (Addendum)
 [place] [facility] AT [facility] Provider Note   CSN: [id number] Arrival date & time: 11/24/23  2140     History  Chief Complaint  Patient presents with   Chest Pain    Kevin [name] is a 73 y.o. male history of A-fib on Eliquis, cholelithiasis, CAD, cocaine use, claudication, CHF, thrombocytopenia, PAD, hypertension, alcohol use, NSVT presented for chest pain.  Triage note states the chest pain began this evening however patient states that his chest pain has been going on for months.  Patient states it comes and goes without obvious trigger or alleviating factors.  Patient states he has been compliant with his Eliquis and his medications but states his legs do look swollen but that this also comes and goes.  Patient denies any shortness of breath.  Patient states the chest pain is substernal but does not radiate.  Per EMS patient was initially diaphoretic according to the wife and was given aspirin and 1 nitro and the pain resolved.  Patient does not endorse this with me.  Patient does have history of dementia.  Home Medications Prior to Admission medications   Medication Sig Start Date End Date Taking? Authorizing Provider  albuterol (VENTOLIN HFA) 108 (90 Base) MCG/ACT inhaler Inhale 2 puffs into the lungs every 6 (six) hours as needed for wheezing or shortness of breath. 01/17/20   Olalere, Adewale A, MD  amLODipine (NORVASC) 5 MG tablet Take 5 mg by mouth daily.    [provider]  apixaban (ELIQUIS) 5 MG TABS tablet Take 1 tablet (5 mg total) by mouth 2 (two) times daily. NEEDS FOLLOW UP APPOINTMENT FOR MORE REFILLS 05/07/23   Bensimhon, Bevelyn Buckles, MD  carvedilol (COREG) 12.5 MG tablet TAKE 1.5 TABLETS (18.75 MG TOTAL) BY MOUTH 2 (TWO) TIMES DAILY WITH A MEAL. 04/11/23   Bensimhon, Bevelyn Buckles, MD  ENTRESTO 97-103 MG TAKE 1 TABLET BY MOUTH 2 (TWO) TIMES DAILY. NEEDS FOLLOW UP APPOINTMENT FOR MORE REFILLS 11/11/23   Bensimhon, Bevelyn Buckles, MD  JARDIANCE 10 MG  TABS tablet TAKE 1 TABLET BY MOUTH EVERY DAY BEFORE BREAKFAST 09/18/22   Bensimhon, Bevelyn Buckles, MD  Multiple Vitamin (MULTIVITAMIN WITH MINERALS) TABS tablet Take 1 tablet by mouth daily. Men's One a Day    [provider]  rosuvastatin (CRESTOR) 20 MG tablet TAKE 1 TABLET BY MOUTH EVERY DAY 09/05/22   Jacklynn Ganong, FNP  spironolactone (ALDACTONE) 25 MG tablet TAKE 1/2 TABLET BY MOUTH EVERY DAY 12/17/22   Milford, Anderson Malta, FNP  TRELEGY ELLIPTA 100-62.5-25 MCG/INH AEPB Take 1 puff by mouth daily. 01/19/20   [provider]      Allergies    Patient has no known allergies.    Review of Systems   Review of Systems  Cardiovascular:  Positive for chest pain.    Physical Exam Updated Vital Signs BP (!) 112/91   Pulse (!) 101   Temp 99 F (37.2 C) (Oral)   Resp (!) 21   Ht 6' (1.829 m)   Wt 95.3 kg   SpO2 99%   BMI 28.49 kg/m  Physical Exam Constitutional:      General: He is not in acute distress. Cardiovascular:     Rate and Rhythm: Tachycardia present. Rhythm irregular.     Pulses: Normal pulses.     Heart sounds: Normal heart sounds.  Pulmonary:     Effort: Pulmonary effort is normal. No respiratory distress.     Breath sounds: Normal breath sounds.  Comments: Able to speak in full sentences Abdominal:     Palpations: Abdomen is soft.     Tenderness: There is no abdominal tenderness. There is no guarding or rebound.  Musculoskeletal:     Comments: 2+ bilateral pitting edema  Skin:    General: Skin is warm and dry.  Neurological:     Mental Status: He is alert.  Psychiatric:        Mood and Affect: Mood normal.     ED Results / Procedures / Treatments   Labs (all labs ordered are listed, but only abnormal results are displayed) Labs Reviewed  BASIC METABOLIC PANEL - Abnormal; Notable for the following components:      Result Value   Potassium 5.4 (*)    Glucose, Bld 142 (*)    Creatinine, Ser 1.32 (*)    Calcium 8.4 (*)    GFR,  Estimated 57 (*)    All other components within normal limits  CBC - Abnormal; Notable for the following components:   WBC 11.0 (*)    RBC 3.89 (*)    MCV 102.3 (*)    MCH 34.2 (*)    Platelets 58 (*)    All other components within normal limits  RAPID URINE DRUG SCREEN, HOSP PERFORMED - Abnormal; Notable for the following components:   Tetrahydrocannabinol POSITIVE (*)    All other components within normal limits  TROPONIN I (HIGH SENSITIVITY) - Abnormal; Notable for the following components:   Troponin I (High Sensitivity) 65 (*)    All other components within normal limits  BRAIN NATRIURETIC PEPTIDE  TROPONIN I (HIGH SENSITIVITY)    EKG EKG Interpretation Date/Time:  Monday November 24 2023 21:46:04 EST Ventricular Rate:  111 PR Interval:    QRS Duration:  130 QT Interval:  381 QTC Calculation: 518 R Axis:   225  Text Interpretation: Atrial fibrillation Right bundle branch block No significant change since last tracing Confirmed by Richardean Canal [phone]) on 11/24/2023 10:11:16 PM  Radiology DG Chest Port 1 View Result Date: 11/25/2023 CLINICAL DATA:  Chest pain delete that EXAM: PORTABLE CHEST 1 VIEW COMPARISON:  02/05/2016 FINDINGS: Stable cardiomediastinal silhouette. Aortic atherosclerotic calcification. Elevated right hemidiaphragm. No focal consolidation, pleural effusion, or pneumothorax. No displaced rib fractures. IMPRESSION: No active disease. Electronically Signed   By: Minerva Fester M.D.   On: 11/25/2023 00:52    Procedures .Critical Care  Performed by: Netta Corrigan, PA-C Authorized by: Netta Corrigan, PA-C   Critical care provider statement:    Critical care time (minutes):  50   Critical care time was exclusive of:  Separately billable procedures and treating other patients   Critical care was necessary to treat or prevent imminent or life-threatening deterioration of the following conditions:  Cardiac failure (elevated troponin)   Critical care was time  spent personally by me on the following activities:  Blood draw for specimens, development of treatment plan with patient or surrogate, discussions with consultants, evaluation of patient's response to treatment, examination of patient, obtaining history from patient or surrogate, review of old charts, re-evaluation of patient's condition, pulse oximetry, ordering and review of radiographic studies, ordering and review of laboratory studies and ordering and performing treatments and interventions   I assumed direction of critical care for this patient from another provider in my specialty: no     Care discussed with: admitting provider       Medications Ordered in ED Medications  furosemide (LASIX) injection 20 mg (20 mg  Intravenous Given 11/25/23 0026)  sodium zirconium cyclosilicate (LOKELMA) packet 10 g (10 g Oral Given 11/25/23 0026)    ED Course/ Medical Decision Making/ A&P             HEART Score: 7                    Medical Decision Making Amount and/or Complexity of Data Reviewed Labs: ordered. Radiology: ordered.  Risk Prescription drug management. Decision regarding hospitalization.   [name] 73 y.o. presented today for chest pain. Working DDx that I considered at this time includes, but not limited to, ACS, pulmonary embolism, community-acquired pneumonia, aortic dissection, pneumothorax, underlying bony abnormality, anemia, thyrotoxicosis, HTN urgency/emergency, esophageal rupture, CHF exacerbation, valvular disorder, myocarditis, pericarditis, endocarditis, pericardial effusion/cardiac tamponade, pulmonary edema, gastritis/PUD/GERD, esophagitis, MSK.  R/o Dx: pulmonary embolism, community-acquired pneumonia, aortic dissection, pneumothorax, underlying bony abnormality, anemia, thyrotoxicosis, HTN urgency/emergency, esophageal rupture, CHF exacerbation, valvular disorder, myocarditis, pericarditis, endocarditis, pericardial effusion/cardiac tamponade, pulmonary  edema, gastritis/PUD/GERD, esophagitis, MSK: These are considered less likely due to history of present illness and physical exam findings. Aortic Dissection: less likely based on the location, quality, onset, and severity of symptoms in this case. Patient also has a lack of underlying history of AD or TAA.   Review of prior external notes: 10/27/2023 unknown  Unique Tests and My Interpretation:  EKG: Rate, rhythm, axis, intervals all examined and without medically relevant abnormality. ST segments without concerns for elevations Troponin: 65 CXR: Unremarkable CBC: Unremarkable BMP: Hyperkalemia 5.4 UDS: THC positive [id number]  Social Determinants of Health: none  Discussion with Independent Historian: None  Discussion of Management of Tests:  Toniann Fail, MD Hospitalist; Kathlene November  Risk: High: hospitalization or escalation of hospital-level care  Risk Stratification Score: HEART Score: 7  Staffed with Silverio Lay, MD  Plan: On exam patient was in no acute distress but was noted to be mildly tachycardic at 101 in the room with A-fib. Patient's physical was remarkable for 2+ pitting edema bilaterally. Labs and CXR will be ordered.  The cardiac monitor was ordered secondary to the patient's history of chest pain and to monitor the patient for dysrhythmia. The cardiac monitor revealed A-fib as interpreted by me. Patient stable at this time.  I tried to contact the wife however nobody answered at this time so we will try again as there does seem to be described seen in the triage note versus what the patient told me which could be due to patient's dementia.  Labs do show hyperkalemia most likely secondary to patient spironolactone so we will give Lasix along with Lokelma.  There are no EKG changes from hyperkalemia.  Patient is still asymptomatic.  For troponin is 65.  No previous to compare however will get delta.  Given patient's high risk cardia history do feel patient may need to be admitted pending  delta troponin.  When I went to go reevaluate the patient patient was in A-fib still however he was at 91 in the room so no longer in A-fib RVR.  Patient's heart score is 7 so do feel patient would benefit from being observed overnight.  I spoke to the hospitalist and patient was accepted for admission.  Patient stable to be admitted.  Patient's troponin is trending up to 181.  Patient is still asymptomatic.  Hospitalist requested that we reach out to cardiology.  Will consult cardiology.  I spoke to cardiology and they state they will come down to see the patient and leave recommendations but do  not think he needs heparin at this time.  Hospitalist made aware.  This chart was dictated using voice recognition software.  Despite best efforts to proofread,  errors can occur which can change the documentation meaning.         Final Clinical Impression(s) / ED Diagnoses Final diagnoses:  Chest pain, unspecified type  Hyperkalemia    Rx / DC Orders ED Discharge Orders     None         Remi Deter 11/25/23 0145    Netta Corrigan, PA-C 11/25/23 0421    Charlynne Pander, MD 11/25/23 [phone]

## 2023-11-25 ENCOUNTER — Observation Stay (HOSPITAL_BASED_OUTPATIENT_CLINIC_OR_DEPARTMENT_OTHER)

## 2023-11-25 DIAGNOSIS — R072 Precordial pain: Secondary | ICD-10-CM

## 2023-11-25 DIAGNOSIS — I4891 Unspecified atrial fibrillation: Secondary | ICD-10-CM

## 2023-11-25 DIAGNOSIS — E785 Hyperlipidemia, unspecified: Secondary | ICD-10-CM | POA: Diagnosis not present

## 2023-11-25 DIAGNOSIS — I25119 Atherosclerotic heart disease of native coronary artery with unspecified angina pectoris: Secondary | ICD-10-CM | POA: Diagnosis not present

## 2023-11-25 DIAGNOSIS — R0789 Other chest pain: Secondary | ICD-10-CM

## 2023-11-25 DIAGNOSIS — R600 Localized edema: Secondary | ICD-10-CM | POA: Diagnosis not present

## 2023-11-25 DIAGNOSIS — R079 Chest pain, unspecified: Secondary | ICD-10-CM | POA: Diagnosis not present

## 2023-11-25 DIAGNOSIS — D696 Thrombocytopenia, unspecified: Secondary | ICD-10-CM | POA: Diagnosis not present

## 2023-11-25 DIAGNOSIS — I10 Essential (primary) hypertension: Secondary | ICD-10-CM

## 2023-11-25 DIAGNOSIS — I255 Ischemic cardiomyopathy: Secondary | ICD-10-CM

## 2023-11-25 LAB — ECHOCARDIOGRAM COMPLETE
AR max vel: 1.81 cm2
AV Area VTI: 2.32 cm2
AV Area mean vel: 1.83 cm2
AV Mean grad: 1.7 mmHg
AV Peak grad: 3.1 mmHg
Ao pk vel: 0.88 m/s
Area-P 1/2: 4.96 cm2
Calc EF: 56.6 %
Height: 72 in
MV VTI: 1.82 cm2
S' Lateral: 3.1 cm
Single Plane A2C EF: 51.8 %
Single Plane A4C EF: 59.3 %
Weight: 3361.57 [oz_av]

## 2023-11-25 LAB — CBC
HCT: 39.8 % (ref 39.0–52.0)
Hemoglobin: 13.3 g/dL (ref 13.0–17.0)
MCH: 34.2 pg — ABNORMAL HIGH (ref 26.0–34.0)
MCHC: 33.4 g/dL (ref 30.0–36.0)
MCV: 102.3 fL — ABNORMAL HIGH (ref 80.0–100.0)
Platelets: 58 10*3/uL — ABNORMAL LOW (ref 150–400)
RBC: 3.89 MIL/uL — ABNORMAL LOW (ref 4.22–5.81)
RDW: 12 % (ref 11.5–15.5)
WBC: 11 10*3/uL — ABNORMAL HIGH (ref 4.0–10.5)
nRBC: 0 % (ref 0.0–0.2)

## 2023-11-25 LAB — TROPONIN I (HIGH SENSITIVITY): Troponin I (High Sensitivity): 181 ng/L (ref <18)

## 2023-11-25 LAB — RAPID URINE DRUG SCREEN, HOSP PERFORMED
Amphetamines: NOT DETECTED
Barbiturates: NOT DETECTED
Benzodiazepines: NOT DETECTED
Cocaine: NOT DETECTED
Opiates: NOT DETECTED
Tetrahydrocannabinol: POSITIVE — AB

## 2023-11-25 LAB — BRAIN NATRIURETIC PEPTIDE: B Natriuretic Peptide: 265.4 pg/mL — ABNORMAL HIGH (ref 0.0–100.0)

## 2023-11-25 MED ORDER — ALBUTEROL SULFATE (2.5 MG/3ML) 0.083% IN NEBU: 3.0000 mL | INHALATION_SOLUTION | Freq: Four times a day (QID) | RESPIRATORY_TRACT | Status: DC | PRN

## 2023-11-25 MED ORDER — SACUBITRIL-VALSARTAN 97-103 MG PO TABS
1.0000 | ORAL_TABLET | Freq: Two times a day (BID) | ORAL | Status: DC
  Administered 2023-11-25 – 2023-11-27 (×5): 1 via ORAL
  Filled 2023-11-25 (×6): qty 1

## 2023-11-25 MED ORDER — ACETAMINOPHEN 325 MG PO TABS
650.0000 mg | ORAL_TABLET | Freq: Four times a day (QID) | ORAL | Status: DC | PRN
  Filled 2023-11-25: qty 2

## 2023-11-25 MED ORDER — SPIRONOLACTONE 12.5 MG HALF TABLET
12.5000 mg | ORAL_TABLET | Freq: Every day | ORAL | Status: DC
  Administered 2023-11-25 – 2023-11-27 (×3): 12.5 mg via ORAL
  Filled 2023-11-25 (×3): qty 1

## 2023-11-25 MED ORDER — POLYETHYLENE GLYCOL 3350 17 G PO PACK: 17.0000 g | PACK | Freq: Every day | ORAL | Status: DC | PRN

## 2023-11-25 MED ORDER — HYDRALAZINE HCL 20 MG/ML IJ SOLN: 10.0000 mg | Freq: Four times a day (QID) | INTRAMUSCULAR | Status: DC | PRN

## 2023-11-25 MED ORDER — APIXABAN 5 MG PO TABS
5.0000 mg | ORAL_TABLET | Freq: Two times a day (BID) | ORAL | Status: DC
  Administered 2023-11-25 – 2023-11-27 (×5): 5 mg via ORAL
  Filled 2023-11-25 (×5): qty 1

## 2023-11-25 MED ORDER — FLUTICASONE FUROATE-VILANTEROL 100-25 MCG/ACT IN AEPB
1.0000 | INHALATION_SPRAY | Freq: Every day | RESPIRATORY_TRACT | Status: DC
  Administered 2023-11-26 – 2023-11-27 (×2): 1 via RESPIRATORY_TRACT
  Filled 2023-11-25: qty 28

## 2023-11-25 MED ORDER — EMPAGLIFLOZIN 10 MG PO TABS
10.0000 mg | ORAL_TABLET | Freq: Every day | ORAL | Status: DC
  Administered 2023-11-25 – 2023-11-27 (×3): 10 mg via ORAL
  Filled 2023-11-25 (×3): qty 1

## 2023-11-25 MED ORDER — SODIUM ZIRCONIUM CYCLOSILICATE 10 G PO PACK
10.0000 g | PACK | Freq: Once | ORAL | Status: AC
  Administered 2023-11-25: 10 g via ORAL
  Filled 2023-11-25: qty 1

## 2023-11-25 MED ORDER — CARVEDILOL 6.25 MG PO TABS
18.7500 mg | ORAL_TABLET | Freq: Two times a day (BID) | ORAL | Status: DC
  Administered 2023-11-25 – 2023-11-27 (×5): 18.75 mg via ORAL
  Filled 2023-11-25 (×5): qty 1

## 2023-11-25 MED ORDER — FUROSEMIDE 10 MG/ML IJ SOLN
20.0000 mg | Freq: Once | INTRAMUSCULAR | Status: AC
  Administered 2023-11-25: 20 mg via INTRAVENOUS
  Filled 2023-11-25: qty 2

## 2023-11-25 MED ORDER — BISACODYL 5 MG PO TBEC: 5.0000 mg | DELAYED_RELEASE_TABLET | Freq: Every day | ORAL | Status: DC | PRN

## 2023-11-25 MED ORDER — UMECLIDINIUM BROMIDE 62.5 MCG/ACT IN AEPB
1.0000 | INHALATION_SPRAY | Freq: Every day | RESPIRATORY_TRACT | Status: DC
  Administered 2023-11-26 – 2023-11-27 (×2): 1 via RESPIRATORY_TRACT
  Filled 2023-11-25: qty 7

## 2023-11-25 MED ORDER — ROSUVASTATIN CALCIUM 20 MG PO TABS
20.0000 mg | ORAL_TABLET | Freq: Every day | ORAL | Status: DC
  Administered 2023-11-25 – 2023-11-26 (×2): 20 mg via ORAL
  Filled 2023-11-25 (×2): qty 1

## 2023-11-25 MED ORDER — ACETAMINOPHEN 650 MG RE SUPP: 650.0000 mg | Freq: Four times a day (QID) | RECTAL | Status: DC | PRN

## 2023-11-25 MED ORDER — PANTOPRAZOLE SODIUM 20 MG PO TBEC
20.0000 mg | DELAYED_RELEASE_TABLET | Freq: Every day | ORAL | Status: DC | PRN
  Administered 2023-11-25: 20 mg via ORAL
  Filled 2023-11-25: qty 1

## 2023-11-25 NOTE — Progress Notes (Signed)
  Echocardiogram 2D Echocardiogram has been performed.  Ocie Doyne RDCS 11/25/2023, 1:23 PM

## 2023-11-25 NOTE — TOC Initial Note (Addendum)
 Transition of Care [facility]) - Initial/Assessment Note    Patient Details  Name: Kevin [name] MRN: [id number] Date of Birth: 07-Nov-1950  [facility]) CM/SW Contact:    Leone Haven, RN Phone Number: 11/25/2023, 3:56 PM  Clinical Narrative:                 From home with  ex spouse, has PCP and insurance on file, states has no HH services in place at this time or DME at home.  States ex wife will transport them home at [employer] and family is support system, states [facility] [address].  Pta self ambulatory. Also ex wife states she is looking for  facility for him because he will need care at home and she will be moving out of town.  Await PT eval.  Expected Discharge Plan: Home/Self Care Barriers to Discharge: Continued Medical Work up   Patient Goals and CMS Choice Patient states their goals for this hospitalization and ongoing recovery are:: return  home with ex wife   Choice offered to / list presented to : NA      Expected Discharge Plan and Services   Discharge Planning Services: CM Consult Post Acute Care Choice: NA Living arrangements for the past 2 months: Single Family Home                 DME Arranged: N/A DME Agency: NA       HH Arranged: NA          Prior Living Arrangements/Services Living arrangements for the past 2 months: Single Family Home Lives with:: Spouse Patient language and need for interpreter reviewed:: Yes        Need for Family Participation in Patient Care: Yes (Comment) Care giver support system in place?: Yes (comment)   Criminal Activity/Legal Involvement Pertinent to Current Situation/Hospitalization: No - Comment as needed  Activities of Daily Living   ADL Screening (condition at time of admission) Independently performs ADLs?: Yes (appropriate for developmental age) Is the patient deaf or have difficulty hearing?: Yes Does the patient have difficulty seeing, even when wearing glasses/contacts?:  Yes Does the patient have difficulty concentrating, remembering, or making decisions?: No  Permission Sought/Granted Permission sought to share information with : Case Manager Permission granted to share information with : Yes, Verbal Permission Granted  Share Information with NAME: Silvana Newness     Permission granted to share info w Relationship: ex wife     Emotional Assessment Appearance:: Appears stated age Attitude/Demeanor/Rapport: Engaged Affect (typically observed): Appropriate Orientation: : Oriented to Self, Oriented to Place, Oriented to  Time, Oriented to Situation Alcohol / Substance Use: Not Applicable, Alcohol Use Psych Involvement: No (comment)  Admission diagnosis:  Hyperkalemia [E87.5] Chest pain [R07.9] Chest pain, unspecified type [R07.9] Patient Active Problem List   Diagnosis Date Noted   Bilateral lower extremity edema 11/25/2023   Chest pain of uncertain etiology 11/25/2023   Coronary artery disease involving native coronary artery of native heart with angina pectoris (HCC) 11/25/2023   Abnormality of gait 06/29/2020   Right hemiparesis (HCC)    Macrocytosis    Hyperglycemia    Slow transit constipation    Chronic systolic congestive heart failure (HCC)    Benign essential HTN    Cerebrovascular accident (CVA) due to embolism of cerebral artery (HCC) 03/03/2020   Acute focal neurological deficit 03/02/2020   Liver cirrhosis (HCC)    Metabolic encephalopathy    Chronic systolic CHF (congestive heart  failure) (HCC)    Paroxysmal atrial fibrillation (HCC)    Essential hypertension    HCV antibody positive    Secondary hypertension, unspecified    Elevated troponin    Blood pressure instability    CAD in native artery cath 2016 90% mRamus  02/01/2016   Atrial fibrillation, permanent (HCC) 02/01/2016   NSTEMI (non-ST elevated myocardial infarction) [facility])    Status epilepticus (HCC) 01/31/2016   High anion gap metabolic acidosis    Lactic  acidosis    Cerebral infarction [facility])    Hypertensive emergency    Seizures (HCC)    Cerebral infarction (HCC)    TIA (transient ischemic attack) 08/23/2015   CKD (chronic kidney disease) stage 3, GFR 30-59 ml/min (HCC) 08/23/2015   Acute ischemic stroke (HCC) 08/23/2015   Abnormal nuclear stress test 09/27/2014   H/O medication noncompliance 09/27/2014   Unstable angina (HCC) 09/27/2014   Persistent atrial fibrillation (HCC) 08/23/2014   Chronic diastolic heart failure (HCC) 08/23/2014   Polysubstance abuse (HCC) 08/23/2014   Erectile dysfunction 08/23/2014   Cirrhosis of liver (HCC) 04/23/2014   DOE (dyspnea on exertion) 04/19/2014   Atrial fibrillation with RVR (HCC) 04/19/2014   Dysphagia 04/19/2014   Obesity 05/02/2012   NSVT (nonsustained ventricular tachycardia) (HCC) 05/02/2012   Thrombocytopenia (HCC) 05/02/2012   Cholelithiases 05/01/2012   Chest pain, no significant macrovascular CAD at cath 05/01/12; Likely due to elevated LVEDP & Afib with Diastolic HF. 04/30/2012   Atrial fibrillation with RVR, converted to SR with TEE and DCCV 05/05/12 04/30/2012   PAD (peripheral artery disease), PTA to Lt. SFA in 2009, total occlusion mid Rt SFA with collaterals  04/30/2012   Cocaine abuse (HCC) 04/30/2012   HTN (hypertension) -Accelerated with End organ involvement 04/30/2012   Tobacco abuse 04/30/2012   ETOH abuse 04/30/2012   Hepatitis C antibody test positive 04/23/2012   PCP:  Default, Provider, MD Pharmacy:   [id number] [place], [place] - [address] AT [facility] [address] [place] [address] Phone: [phone] Fax: [phone]     Social Drivers of Health (SDOH) Social History: SDOH Screenings   Food Insecurity: No Food Insecurity (11/25/2023)  Housing: High Risk (11/25/2023)  Transportation Needs: No Transportation Needs (11/25/2023)  Utilities: Not At Risk (11/25/2023)  Depression (PHQ2-9): Low Risk  (06/29/2020)  Financial  Resource Strain: Low Risk  (04/24/2020)  Social Connections: Socially Isolated (11/25/2023)  Tobacco Use: Medium Risk (11/24/2023)   SDOH Interventions:     Readmission Risk Interventions     No data to display

## 2023-11-25 NOTE — H&P (Signed)
 [facility] History and Physical  FERRON ISHMAEL HYQ:[id number] DOB: 1951/09/01 DOA: 11/24/2023 PCP: Default, Provider, MD  Presented from: Home Chief Complaint: Chest pain  History of Present Illness: Kevin [name] is a 73 y.o. male with PMH significant for HTN, HLD, CAD, NICM with recovered EF, PAD, permanent A-fib on Eliquis, TIA, mild cognitive impairment, h/o polysubstance abuse (cocaine, tobacco), hep C. Patient presented to the ED with complaint of acute worsening of chronic chest pain. Patient has history of nonischemic cardiomyopathy likely related to tachyarrhythmia.  EF was as low as 35% and later recovered.  Was seen by cardiologist Dr. Gala Romney in the past. Patient has ongoing chest pain for many months, usually is exertional and improves with rest.  Onset with some dyspnea. 3/3, patient had recurrence of chest pain.  Also was diaphoretic.  EMS was called, given aspirin and nitro with some improvement.  Brought to the ED.  In the ED, patient afebrile, heart rate in 100s, blood pressure 148/95, breathing on room air Labs with WC count 11, platelet low at 58, potassium elevated to 5.4, BUN/creatinine 17/1.32, BNP 265, troponin 65>181 Urine drug screen positive for [facility] Chest x-ray without active disease EKG with A-fib at 111 bpm, QTc prolonged to 518 ms  Patient was given IV Lasix, 1 dose of Lokelma oral. Seen by cardiology. Hospitalist service was requested for inpatient management.   I received this patient as a carryover admission from last night At the time of my evaluation, patient was lying on bed.  Not in distress.  He was alert, awake, slow to respond.  Not in distress At his request, I tried to call his wife but the number is not in service.  No other number is available History reviewed and detailed as above.    Review of Systems:  All systems were reviewed and were negative unless otherwise mentioned in the HPI   Past medical history: Past Medical  History:  Diagnosis Date   Atrial fibrillation, permanent (HCC) 02/01/2016   Blood transfusion without reported diagnosis    CAD in native artery cath 2016 90% mRamus  02/01/2016   CHF (congestive heart failure) (HCC)    Cholelithiases 05/01/2012   Claudication (HCC)    bilateral   Cocaine abuse (HCC)    Dysrhythmia    afib   ETOH abuse    Hepatitis C antibody test positive 04/2012   Hyperpigmentation    Hypertension    PAD (peripheral artery disease) (HCC)    Persistent atrial fibrillation (HCC) 08/23/2014   Thrombocytopenia (HCC)    Tobacco abuse     Past surgical history: Past Surgical History:  Procedure Laterality Date   Cardiolite     negative for ischemia   CARDIOVASCULAR STRESS TEST     CARDIOVERSION  05/05/2012   Procedure: CARDIOVERSION;  Surgeon: Thurmon Fair, MD;  Location: [facility] ENDOSCOPY;  Service: Cardiovascular;  Laterality: N/A;   CAROTID ARTERY ANGIOPLASTY     DOPPLER ECHOCARDIOGRAPHY     ENDOVASCULAR STENT INSERTION  right leg   LEFT HEART CATHETERIZATION WITH CORONARY ANGIOGRAM N/A 05/01/2012   Procedure: LEFT HEART CATHETERIZATION WITH CORONARY ANGIOGRAM;  Surgeon: Marykay Lex, MD;  Location: [facility] CATH LAB;  Service: Cardiovascular;  Laterality: N/A;   LEFT HEART CATHETERIZATION WITH CORONARY ANGIOGRAM N/A 09/27/2014   Procedure: LEFT HEART CATHETERIZATION WITH CORONARY ANGIOGRAM;  Surgeon: Chrystie Nose, MD;  Location: [facility] CATH LAB;  Service: Cardiovascular;  Laterality: N/A;   TEE WITHOUT CARDIOVERSION  05/05/2012   Procedure: TRANSESOPHAGEAL  ECHOCARDIOGRAM (TEE);  Surgeon: Thurmon Fair, MD;  Location: [facility] ENDOSCOPY;  Service: Cardiovascular;  Laterality: N/A;    Social History:  reports that he has quit smoking. His smoking use included cigarettes. He has a 10 pack-year smoking history. He has never used smokeless tobacco. He reports that he does not currently use alcohol. He reports that he does not use drugs.  Allergies:  No Known Allergies Patient has  no known allergies.   Family history:  Family History  Problem Relation Age of Onset   Seizures Daughter    Cancer Mother      Physical Exam: Vitals:   11/25/23 0008 11/25/23 0009 11/25/23 0103 11/25/23 0614  BP:  (!) 112/91 (!) 122/97 (!) 145/97  Pulse:  (!) 101 (!) 104 (!) 111  Resp: 19 (!) 21 (!) 25 (!) 27  Temp:   97.7 F (36.5 C) 98.1 F (36.7 C)  TempSrc:   Oral Oral  SpO2:  99% 97% 95%  Weight:      Height:       Wt Readings from Last 3 Encounters:  11/24/23 95.3 kg  02/12/22 95.3 kg  10/16/21 97.1 kg   Body mass index is 28.49 kg/m.  General exam: Pleasant, elderly African-American male.  Not in distress Skin: No rashes, lesions or ulcers. HEENT: Atraumatic, normocephalic, no obvious bleeding Lungs: Clear to auscultation bilaterally,  CVS: Irregular tach, no murmur,   GI/Abd: Soft, nontender, nondistended, bowel sound present,   CNS: Alert, awake, slow to respond, oriented to place and person Psychiatry: Sad affect Extremities: 1+ bilateral pedal edema, no calf tenderness   ----------------------------------------------------------------------------------------------------------------------------------------- ----------------------------------------------------------------------------------------------------------------------------------------- -----------------------------------------------------------------------------------------------------------------------------------------  Assessment/Plan: Principal Problem:   Chest pain, no significant macrovascular CAD at cath 05/01/12; Likely due to elevated LVEDP & Afib with Diastolic HF. Active Problems:   Bilateral lower extremity edema   Chest pain of uncertain etiology   Coronary artery disease involving native coronary artery of native heart with angina pectoris (HCC)  Elevated troponin H/o CAD Presented with acute on chronic chest pain, shortness of breath. Troponin elevated but in the setting of A-fib  with RVR Cardiology consult appreciated. Recommended no heparin at this time given significant thrombocytopenia.  Unclear if patient would benefit from cardiac cath given dementia Pending echocardiogram  A-fib with RVR H/o persistent A-fib Presented with heart rate of 111. Heart rate gradually improving. Resume Coreg Chronically anticoagulated with Eliquis  NICM with recovered EF Hypertension Echo June 2022 with EF 60 to 65%, moderate LVH, normal RV was severely LAE On exam, has 1+ bilateral pedal edema. PTA meds- Coreg, Entresto, Jardiance, Aldactone, amlodipine.  Was not on Lasix at home Currently continued on Coreg, [place], Jardiance and Aldactone.  Eliquis on hold Noted a plan from cardiology to give additional Lasix 20 mg IV this morning.  PAD, TIA HLD Eliquis and Crestor  h/o polysubstance abuse (cocaine, tobacco, THC) UDS positive for THC counseled to quit  Prolonged Qtc Initially with QTc prolonged to 518 ms Monitor.  Avoid QTc prolonging meds  Hyperkalemia Patient was given IV Lasix, 1 dose of Lokelma oral.  Repeat labs in a.m. Recent Labs  Lab 11/24/23 2250  K 5.4*   GERD PPI  Mobility: Encourage ambulation  Goals of care:   Code Status: Full Code    DVT prophylaxis:   apixaban (ELIQUIS) tablet 5 mg   Antimicrobials: None Fluid: None Consultants: Cardiology Family Communication: Tried to reach wife without success  Dispo: The patient is from: Home  Anticipated d/c is to: Home  Diet: Diet Order             Diet 2 gram sodium Room service appropriate? Yes; Fluid consistency: Thin  Diet effective now                    ------------------------------------------------------------------------------------- Severity of Illness: The appropriate patient status for this patient is OBSERVATION. Observation status is judged to be reasonable and necessary in order to provide the required intensity of service to ensure the patient's  safety. The patient's presenting symptoms, physical exam findings, and initial radiographic and laboratory data in the context of their medical condition is felt to place them at decreased risk for further clinical deterioration. Furthermore, it is anticipated that the patient will be medically stable for discharge from the hospital within 2 midnights of admission.  -------------------------------------------------------------------------------------   Home Meds: Prior to Admission medications   Medication Sig Start Date End Date Taking? Authorizing Provider  apixaban (ELIQUIS) 5 MG TABS tablet Take 1 tablet (5 mg total) by mouth 2 (two) times daily. NEEDS FOLLOW UP APPOINTMENT FOR MORE REFILLS 05/07/23  Yes Bensimhon, Bevelyn Buckles, MD  ENTRESTO 97-103 MG TAKE 1 TABLET BY MOUTH 2 (TWO) TIMES DAILY. NEEDS FOLLOW UP APPOINTMENT FOR MORE REFILLS 11/11/23  Yes Bensimhon, Bevelyn Buckles, MD  albuterol (VENTOLIN HFA) 108 (90 Base) MCG/ACT inhaler Inhale 2 puffs into the lungs every 6 (six) hours as needed for wheezing or shortness of breath. 01/17/20  Yes Olalere, Adewale A, MD  amLODipine (NORVASC) 5 MG tablet Take 5 mg by mouth daily.   Yes [provider]  carvedilol (COREG) 12.5 MG tablet TAKE 1.5 TABLETS (18.75 MG TOTAL) BY MOUTH 2 (TWO) TIMES DAILY WITH A MEAL. 04/11/23  Yes Bensimhon, Bevelyn Buckles, MD  furosemide (LASIX) 40 MG tablet Take 40 mg by mouth daily. 10/01/23   [provider]  JARDIANCE 10 MG TABS tablet TAKE 1 TABLET BY MOUTH EVERY DAY BEFORE BREAKFAST 09/18/22  Yes Bensimhon, Bevelyn Buckles, MD  Multiple Vitamin (MULTIVITAMIN WITH MINERALS) TABS tablet Take 1 tablet by mouth daily. Men's One a Day   Yes [provider]  pantoprazole (PROTONIX) 20 MG tablet Take 20 mg by mouth daily as needed for heartburn or indigestion.   Yes [provider]  rosuvastatin (CRESTOR) 20 MG tablet TAKE 1 TABLET BY MOUTH EVERY DAY 09/05/22  Yes Anderson, [place], FNP  spironolactone (ALDACTONE)  25 MG tablet TAKE 1/2 TABLET BY MOUTH EVERY DAY 12/17/22  Yes [place], [place], FNP  TRELEGY ELLIPTA 100-62.5-25 MCG/INH AEPB Take 1 puff by mouth daily. 01/19/20  Yes [provider]    Labs on Admission:   CBC: Recent Labs  Lab 11/24/23 2250  WBC 11.0*  HGB 13.3  HCT 39.8  MCV 102.3*  PLT 58*    Basic Metabolic Panel: Recent Labs  Lab 11/24/23 2250  NA 136  K 5.4*  CL 103  CO2 25  GLUCOSE 142*  BUN 17  CREATININE 1.32*  CALCIUM 8.4*    Liver Function Tests: No results for input(s): "AST", "ALT", "ALKPHOS", "BILITOT", "PROT", "ALBUMIN" in the last 168 hours. No results for input(s): "LIPASE", "AMYLASE" in the last 168 hours. No results for input(s): "AMMONIA" in the last 168 hours.  Cardiac Enzymes: No results for input(s): "CKTOTAL", "CKMB", "CKMBINDEX", "TROPONINI" in the last 168 hours.  BNP (last 3 results) Recent Labs    11/25/23 0115  BNP 265.4*    ProBNP (last 3 results) No  results for input(s): "PROBNP" in the last 8760 hours.  CBG: No results for input(s): "GLUCAP" in the last 168 hours.  Lipase  No results found for: "LIPASE"   Urinalysis    Component Value Date/Time   COLORURINE AMBER (A) 03/10/2020 1633   APPEARANCEUR HAZY (A) 03/10/2020 1633   LABSPEC 1.016 03/10/2020 1633   PHURINE 5.0 03/10/2020 1633   GLUCOSEU NEGATIVE 03/10/2020 1633   HGBUR MODERATE (A) 03/10/2020 1633   BILIRUBINUR NEGATIVE 03/10/2020 1633   KETONESUR NEGATIVE 03/10/2020 1633   PROTEINUR 100 (A) 03/10/2020 1633   NITRITE NEGATIVE 03/10/2020 1633   LEUKOCYTESUR TRACE (A) 03/10/2020 1633     Drugs of Abuse     Component Value Date/Time   LABOPIA NONE DETECTED 11/25/2023 0004   COCAINSCRNUR NONE DETECTED 11/25/2023 0004   LABBENZ NONE DETECTED 11/25/2023 0004   AMPHETMU NONE DETECTED 11/25/2023 0004   THCU POSITIVE (A) 11/25/2023 0004   LABBARB NONE DETECTED 11/25/2023 0004      Radiological Exams on Admission: DG Chest Port 1 View Result  Date: 11/25/2023 CLINICAL DATA:  Chest pain delete that EXAM: PORTABLE CHEST 1 VIEW COMPARISON:  02/05/2016 FINDINGS: Stable cardiomediastinal silhouette. Aortic atherosclerotic calcification. Elevated right hemidiaphragm. No focal consolidation, pleural effusion, or pneumothorax. No displaced rib fractures. IMPRESSION: No active disease. Electronically Signed   By: Minerva Fester M.D.   On: 11/25/2023 00:52     Signed, Lorin Glass, MD [facility] 11/25/2023

## 2023-11-25 NOTE — ED Notes (Signed)
 Wife requested call from hospitalitis when available

## 2023-11-25 NOTE — Care Management Obs Status (Signed)
 MEDICARE OBSERVATION STATUS NOTIFICATION   Patient Details  Name: Kevin [name] MRN: [id number] Date of Birth: 06-22-1951   Medicare Observation Status Notification Given:  Yes    Leone Haven, RN 11/25/2023, 4:07 PM

## 2023-11-25 NOTE — Progress Notes (Addendum)
 Patient Name: Kevin [name] Date of Encounter: 11/25/2023 [facility] Cardiologist: None   Interval Summary  .    No chest pain this morning, remains in Afib but rates somewhat improved.   Vital Signs .    Vitals:   11/25/23 0008 11/25/23 0009 11/25/23 0103 11/25/23 0614  BP:  (!) 112/91 (!) 122/97 (!) 145/97  Pulse:  (!) 101 (!) 104 (!) 111  Resp: 19 (!) 21 (!) 25 (!) 27  Temp:   97.7 F (36.5 C) 98.1 F (36.7 C)  TempSrc:   Oral Oral  SpO2:  99% 97% 95%  Weight:      Height:       No intake or output data in the 24 hours ending 11/25/23 0758    11/24/2023   10:04 PM 02/12/2022    3:20 PM 10/16/2021    3:16 PM  Last 3 Weights  Weight (lbs) 210 lb 1.6 oz 210 lb 3.2 oz 214 lb  Weight (kg) 95.3 kg 95.346 kg 97.07 kg      Telemetry/ECG    Atrial fibrillation rates 90-100s  - Personally Reviewed  Physical Exam .    GEN: No acute distress.   Neck: No JVD Cardiac: Irreg Irreg, no murmurs, rubs, or gallops.  Respiratory: Clear to auscultation bilaterally. GI: Soft, nontender, non-distended  MS: No edema  Assessment & Plan .     73 y.o. male with a hx of dementia, PAD, CAD, severe HTN, polysubstance abuse (cocaine and ETOH), tobacco use, persistent Afib (on Eliquis), TIA, Hepatitis C and NICM with recovered EF who is being seen 11/25/2023 for the evaluation of elevated troponin at the request of medicine.   Elevated troponin CAD -- Cath in 1/16 for abnormal Myoview. Found 90% tubular mid-ramus stenosis and 70% bifurcation PDA/PLB stenosis. Treated medically.  -- presented with chest pain and shortness of breath, though this has been present for several months. According to notes, he has a hx of chronic angina  -- reports being pain free at the time of arrival to the ED, no pain since  -- hsTn 65>>181 (though in the setting of afib RVR, suspect could be demand) -- given his issues with dementia, and significant thrombocytopenia he would be high risk for  invasive evaluation and antiplatelet therapy. No heparin at this time. Anticipate medical management  -- check echo  Atrial fibrillation with RVR Hx of persistent afib  -- EKG on admission with afib 111bpm, ST depression and TWI in inferolateral leads (unchanged from prior) -- rates in the 90s -- reports compliance with home Eliquis (wife is his caregiver)  NICM with recovered EF -- echo 02/2022 with LVEF of 60-65%, moderate LVH, normal RV, severe LAE -- does not appear significantly volume overloaded on exam -- GDMT: coreg 12.5mg  BID, entresto 97-103mg  BID, jardiance 10mg  daily, spiro 25mg  daily. Hold amlodipine for now   HTN -- elevated but has not received home medications yet  HLD -- on crestor 20mg  daily  Polysubstance abuse Thrombocytopenia  Hep C TIA   For questions or updates, please contact [facility] Please consult [email or web address] for contact info under        Signed, Laverda Page, NP    Patient seen and examined. Agree with assessment and plan.  At present, patient is breathing better.  Telemetry reveals atrial fibrillation with rates now in the 90s.  Has previously documented CAD from catheterization in January 2016 with 90% tubular mid ramus stenosis at 70% bifurcation PDA/PLB stenosis.  He has been treated medically.  Previously had reduced EF at 35 to 40% which has subsequently improved last echo in June 2023 normalized to 60 to 65%.  Patient has not had recent echo.  Will repeat echo Doppler study.  BP earlier was elevated at 145/97.  Plan medical management.  BNP mildly elevated at 265.  With leg edema, will give an additional Lasix 20 mg iv now.  Check lipid studies.   Lennette Bihari, MD, [facility] 11/25/2023 9:38 AM

## 2023-11-25 NOTE — Progress Notes (Signed)
 Heart Failure Navigator Progress Note  Assessed for Heart & Vascular TOC clinic readiness.  Patient does not meet criteria due to Advanced Heart Failure Team patient of Dr. Gala Romney. .   Navigator will sign off at this time.   Rhae Hammock, BSN, Scientist, clinical (histocompatibility and immunogenetics) Only

## 2023-11-25 NOTE — Consult Note (Signed)
 Cardiology Consultation   Patient ID: Kevin [name] MRN: [id number]; DOB: [birth year]  Admit date: 11/24/2023 Date of Consult: 11/25/2023  PCP:  Default, Provider, MD   [facility] Providers Cardiologist:  None        Patient Profile:   Kevin [name] is a 73 y.o. male with a hx of dementia, PAD, CAD, severe HTN, polysubstance abuse (cocaine and ETOH), tobacco use, persistent Afib (on Eliquis), TIA, Hepatitis C and NICM with recovered EF who is being seen 11/25/2023 for the evaluation of elevated troponin at the request of medicine.  History of Present Illness:   Kevin [name] is a 73 year old male with history of dementia, PAD, CAD (treated medically), hypertension, persistent A-fib on Eliquis, CVA, nonischemic cardiomyopathy with recovered EF who presented with acute on chronic chest pain.  Briefly, patient of Dr. Prescott Gum.  Last seen in clinic on 01/2022.  At a previous felt nonischemic cardiomyopathy related to tachyarrhythmia with EF 35% now with recovered EF.  Had an abnormal Myoview for which he underwent cath which I do not see the records found to have 90% tubular mid ramus stenosis and 70% bifurcation PDA/PLB stenosis that was treated medically.  Back in 01/2022 would get shortness of breath with walking at that time.  He states that he has had ongoing chest pain for many months.  No current chest pain. Exertional and improves with rest, some dyspnea. Per report, wife states that he was more diaphoretic this evening so called EMS for which she got aspirin and nitro with improvement in his pain. States he takes his Eliquis daily. Of note he does have significant cognitive impairment. Denies orthopnea, PND or worsening LE edema. Denies syncope.   On arrival to the ED blood pressure 148/95, and A-fib with rates in the high 100s.  EKG notable for A-fib with RVR with ST depressions and T wave inversions in the inferior lateral leads that are consistent with priors.   Troponin 65-181.  BNP 260.  Creatinine 1.3 WBC 11, platelets 58.  Past Medical History:  Diagnosis Date   Atrial fibrillation, permanent (HCC) 02/01/2016   Blood transfusion without reported diagnosis    CAD in native artery cath 2016 90% mRamus  02/01/2016   CHF (congestive heart failure) (HCC)    Cholelithiases 05/01/2012   Claudication (HCC)    bilateral   Cocaine abuse (HCC)    Dysrhythmia    afib   ETOH abuse    Hepatitis C antibody test positive 04/2012   Hyperpigmentation    Hypertension    PAD (peripheral artery disease) (HCC)    Persistent atrial fibrillation (HCC) 08/23/2014   Thrombocytopenia (HCC)    Tobacco abuse    Past Surgical History:  Procedure Laterality Date   Cardiolite     negative for ischemia   CARDIOVASCULAR STRESS TEST     CARDIOVERSION  05/05/2012   Procedure: CARDIOVERSION;  Surgeon: Thurmon Fair, MD;  Location: [facility] ENDOSCOPY;  Service: Cardiovascular;  Laterality: N/A;   CAROTID ARTERY ANGIOPLASTY     DOPPLER ECHOCARDIOGRAPHY     ENDOVASCULAR STENT INSERTION  right leg   LEFT HEART CATHETERIZATION WITH CORONARY ANGIOGRAM N/A 05/01/2012   Procedure: LEFT HEART CATHETERIZATION WITH CORONARY ANGIOGRAM;  Surgeon: Marykay Lex, MD;  Location: [facility] CATH LAB;  Service: Cardiovascular;  Laterality: N/A;   LEFT HEART CATHETERIZATION WITH CORONARY ANGIOGRAM N/A 09/27/2014   Procedure: LEFT HEART CATHETERIZATION WITH CORONARY ANGIOGRAM;  Surgeon: Chrystie Nose, MD;  Location: [facility] CATH LAB;  Service:  Cardiovascular;  Laterality: N/A;   TEE WITHOUT CARDIOVERSION  05/05/2012   Procedure: TRANSESOPHAGEAL ECHOCARDIOGRAM (TEE);  Surgeon: Thurmon Fair, MD;  Location: [facility] ENDOSCOPY;  Service: Cardiovascular;  Laterality: N/A;    Home Medications:  Prior to Admission medications   Medication Sig Start Date End Date Taking? Authorizing Provider  apixaban (ELIQUIS) 5 MG TABS tablet Take 1 tablet (5 mg total) by mouth 2 (two) times daily. NEEDS FOLLOW UP APPOINTMENT FOR  MORE REFILLS 05/07/23  Yes Bensimhon, Bevelyn Buckles, MD  ENTRESTO 97-103 MG TAKE 1 TABLET BY MOUTH 2 (TWO) TIMES DAILY. NEEDS FOLLOW UP APPOINTMENT FOR MORE REFILLS 11/11/23  Yes Bensimhon, Bevelyn Buckles, MD  albuterol (VENTOLIN HFA) 108 (90 Base) MCG/ACT inhaler Inhale 2 puffs into the lungs every 6 (six) hours as needed for wheezing or shortness of breath. 01/17/20   Olalere, Adewale A, MD  amLODipine (NORVASC) 5 MG tablet Take 5 mg by mouth daily.    [provider]  carvedilol (COREG) 12.5 MG tablet TAKE 1.5 TABLETS (18.75 MG TOTAL) BY MOUTH 2 (TWO) TIMES DAILY WITH A MEAL. 04/11/23   Bensimhon, Bevelyn Buckles, MD  JARDIANCE 10 MG TABS tablet TAKE 1 TABLET BY MOUTH EVERY DAY BEFORE BREAKFAST 09/18/22   Bensimhon, Bevelyn Buckles, MD  Multiple Vitamin (MULTIVITAMIN WITH MINERALS) TABS tablet Take 1 tablet by mouth daily. Men's One a Day    [provider]  rosuvastatin (CRESTOR) 20 MG tablet TAKE 1 TABLET BY MOUTH EVERY DAY 09/05/22   Jacklynn Ganong, FNP  spironolactone (ALDACTONE) 25 MG tablet TAKE 1/2 TABLET BY MOUTH EVERY DAY 12/17/22   Milford, Anderson Malta, FNP  TRELEGY ELLIPTA 100-62.5-25 MCG/INH AEPB Take 1 puff by mouth daily. 01/19/20   [provider]    Inpatient Medications: Scheduled Meds:  Continuous Infusions:  PRN Meds:   Allergies:   No Known Allergies  Social History:   Social History   Socioeconomic History   Marital status: Married    Spouse name: Stefano Gaul   Number of children: 5   Years of education: 12   Highest education level: Not on file  Occupational History   Occupation: Retired    Associate Professor: [employer]  Tobacco Use   Smoking status: Former    Current packs/day: 0.25    Average packs/day: 0.3 packs/day for 40.0 years (10.0 ttl pk-yrs)    Types: Cigarettes   Smokeless tobacco: Never  Vaping Use   Vaping status: Never Used  Substance and Sexual Activity   Alcohol use: Not Currently    Alcohol/week: 0.0 - 16.0 standard drinks of alcohol     Comment: He used to drink up to 1/2 pint daily; 6 beers. Quit in March 2015   Drug use: No    Comment: cocaine, marijuana   Sexual activity: Yes    Partners: Female  Other Topics Concern   Not on file  Social History Narrative   Lives with his wife.  Was raised by a non-related family from the age of 14 years, and so knows very little about his biological family history.   Social Drivers of Corporate investment banker Strain: Low Risk  (04/24/2020)   Overall Financial Resource Strain (CARDIA)    Difficulty of Paying Living Expenses: Not hard at all  Food Insecurity: No Food Insecurity (04/24/2020)   Hunger Vital Sign    Worried About Running Out of Food in the Last Year: Never true    Ran Out of Food in the Last Year: Never true  Transportation Needs: No Transportation Needs (04/24/2020)   PRAPARE - Administrator, Civil Service (Medical): No    Lack of Transportation (Non-Medical): No  Physical Activity: Not on file  Stress: Not on file  Social Connections: Not on file  Intimate Partner Violence: Not on file    Family History:    Family History  Problem Relation Age of Onset   Seizures Daughter    Cancer Mother      ROS:  Please see the history of present illness.  All other ROS reviewed and negative.     Physical Exam/Data:   Vitals:   11/25/23 0005 11/25/23 0008 11/25/23 0009 11/25/23 0103  BP: (!) 147/84  (!) 112/91 (!) 122/97  Pulse: 92  (!) 101 (!) 104  Resp:  19 (!) 21 (!) 25  Temp:    97.7 F (36.5 C)  TempSrc:    Oral  SpO2: 99%  99% 97%  Weight:      Height:       No intake or output data in the 24 hours ending 11/25/23 0529    11/24/2023   10:04 PM 02/12/2022    3:20 PM 10/16/2021    3:16 PM  Last 3 Weights  Weight (lbs) 210 lb 1.6 oz 210 lb 3.2 oz 214 lb  Weight (kg) 95.3 kg 95.346 kg 97.07 kg     Body mass index is 28.49 kg/m.  General:  Well nourished, well developed, in no acute distress HEENT: normal Neck: no JVD Vascular: No  carotid bruits; Distal pulses 2+ bilaterally Cardiac:  normal S1, S2; regular rate, irregular rhythm; no murmur  Lungs:  clear to auscultation bilaterally, no wheezing, rhonchi or rales  Abd: soft, nontender, no hepatomegaly  Ext: 1+ edema bilaterally  Musculoskeletal:  No deformities, BUE and BLE strength normal and equal Skin: warm and dry  Neuro:  CNs 2-12 intact, no focal abnormalities noted Psych:  Normal affect   EKG:  The EKG was personally reviewed and demonstrates:  Afib with RVR, RBBB, STD and TWI inferior lateral consistent with priors Telemetry:  Telemetry was personally reviewed and demonstrates:  A.Fib  Relevant CV Studies:  TTE 03/04/2022 IMPRESSIONS    1. Left ventricular ejection fraction, by estimation, is 60 to 65%. The  left ventricle has normal function. The left ventricle has no regional  wall motion abnormalities. There is moderate left ventricular hypertrophy  of the basal-septal segment. Left  ventricular diastolic function could not be evaluated.   2. Right ventricular systolic function is normal. The right ventricular  size is normal. There is mildly elevated pulmonary artery systolic  pressure.   3. Left atrial size was severely dilated.   4. The mitral valve is normal in structure. Mild mitral valve  regurgitation. No evidence of mitral stenosis.   5. Tricuspid valve regurgitation is moderate.   6. The aortic valve is tricuspid. There is mild calcification of the  aortic valve. There is mild thickening of the aortic valve. Aortic valve  regurgitation is trivial. No aortic stenosis is present.   7. The inferior vena cava is normal in size with greater than 50%  respiratory variability, suggesting right atrial pressure of 3 mmHg.   1/16 Cath (unable to see records) Found 90% tubular mid-ramus stenosis and 70% bifurcation PDA/PLB stenosis. Treated medically.   Laboratory Data:  High Sensitivity Troponin:   Recent Labs  Lab 11/24/23 2250  11/25/23 0115  TROPONINIHS 65* 181*     Chemistry Recent Labs  Lab 11/24/23 2250  NA 136  K 5.4*  CL 103  CO2 25  GLUCOSE 142*  BUN 17  CREATININE 1.32*  CALCIUM 8.4*  GFRNONAA 57*  ANIONGAP 8    No results for input(s): "PROT", "ALBUMIN", "AST", "ALT", "ALKPHOS", "BILITOT" in the last 168 hours. Lipids No results for input(s): "CHOL", "TRIG", "HDL", "LABVLDL", "LDLCALC", "CHOLHDL" in the last 168 hours.  Hematology Recent Labs  Lab 11/24/23 2250  WBC 11.0*  RBC 3.89*  HGB 13.3  HCT 39.8  MCV 102.3*  MCH 34.2*  MCHC 33.4  RDW 12.0  PLT 58*   Thyroid No results for input(s): "TSH", "FREET4" in the last 168 hours.  BNP Recent Labs  Lab 11/25/23 0115  BNP 265.4*    DDimer No results for input(s): "DDIMER" in the last 168 hours.  Radiology/Studies:  DG Chest Port 1 View Result Date: 11/25/2023 CLINICAL DATA:  Chest pain delete that EXAM: PORTABLE CHEST 1 VIEW COMPARISON:  02/05/2016 FINDINGS: Stable cardiomediastinal silhouette. Aortic atherosclerotic calcification. Elevated right hemidiaphragm. No focal consolidation, pleural effusion, or pneumothorax. No displaced rib fractures. IMPRESSION: No active disease. Electronically Signed   By: Minerva Fester M.D.   On: 11/25/2023 00:52   Assessment and Plan:   Demand ischemia in the setting of hypertension and A-fib with RVR Coronary artery disease Hypertension, hyperlipidemia Patient has longstanding chronic angina with known coronary artery disease being medically managed.  Troponin elevated likely in the setting of demand ischemia with hypertension and A-fib with RVR.  He has significant thrombocytopenia and dementia and I think the risk of left heart catheterization outweighs the benefits at this time, unclear medication compliance.  Would maximize medical management for coronary artery disease and antianginal therapy.  Do not recommend heparinization at this time. -Status post aspirin, continue Eliquis -Continue  Crestor 20 mg, LDL 78 in 2021, would repeat lipids and consider dose escalation for LDL less than 70 -Continue Coreg 12.5 mg twice daily, can increase for better rate control and antianginal therapy if BP allows -Continue Entresto -Continue Jardiance -Continue spironolactone -Continue amlodipine -Consider pharmacy consult for med fill to see if he is been compliant with his drug regimen  4.  Nonischemic cardiomyopathy with recovered EF Slight lower extremity edema but otherwise no gross volume overload.  Oxygenating well -Continue guideline medical therapy as above  Risk Assessment/Risk Scores:     TIMI Risk Score for Unstable Angina or Non-ST Elevation MI:   The patient's TIMI risk score is 6, which indicates a 41% risk of all cause mortality, new or recurrent myocardial infarction or need for urgent revascularization in the next 14 days.    CHA2DS2-VASc Score =     This indicates a  % annual risk of stroke. The patient's score is based upon:           For questions or updates, please contact [facility] Please consult [email or web address] for contact info under    Signed, Thana Ates, MD  11/25/2023 5:29 AM

## 2023-11-26 DIAGNOSIS — D696 Thrombocytopenia, unspecified: Secondary | ICD-10-CM

## 2023-11-26 DIAGNOSIS — R600 Localized edema: Secondary | ICD-10-CM | POA: Diagnosis not present

## 2023-11-26 DIAGNOSIS — E785 Hyperlipidemia, unspecified: Secondary | ICD-10-CM | POA: Diagnosis not present

## 2023-11-26 DIAGNOSIS — R079 Chest pain, unspecified: Secondary | ICD-10-CM | POA: Diagnosis not present

## 2023-11-26 DIAGNOSIS — R0789 Other chest pain: Secondary | ICD-10-CM | POA: Diagnosis not present

## 2023-11-26 DIAGNOSIS — I25119 Atherosclerotic heart disease of native coronary artery with unspecified angina pectoris: Secondary | ICD-10-CM | POA: Diagnosis not present

## 2023-11-26 LAB — CBC
HCT: 39.8 % (ref 39.0–52.0)
Hemoglobin: 13.5 g/dL (ref 13.0–17.0)
MCH: 34 pg (ref 26.0–34.0)
MCHC: 33.9 g/dL (ref 30.0–36.0)
MCV: 100.3 fL — ABNORMAL HIGH (ref 80.0–100.0)
Platelets: 67 10*3/uL — ABNORMAL LOW (ref 150–400)
RBC: 3.97 MIL/uL — ABNORMAL LOW (ref 4.22–5.81)
RDW: 11.9 % (ref 11.5–15.5)
WBC: 5.3 10*3/uL (ref 4.0–10.5)
nRBC: 0 % (ref 0.0–0.2)

## 2023-11-26 LAB — BASIC METABOLIC PANEL
Anion gap: 9 (ref 5–15)
BUN: 14 mg/dL (ref 8–23)
CO2: 25 mmol/L (ref 22–32)
Calcium: 8.2 mg/dL — ABNORMAL LOW (ref 8.9–10.3)
Chloride: 103 mmol/L (ref 98–111)
Creatinine, Ser: 1.13 mg/dL (ref 0.61–1.24)
GFR, Estimated: >60 mL/min (ref >60)
Glucose, Bld: 104 mg/dL — ABNORMAL HIGH (ref 70–99)
Potassium: 3.2 mmol/L — ABNORMAL LOW (ref 3.5–5.1)
Sodium: 137 mmol/L (ref 135–145)

## 2023-11-26 LAB — LIPID PANEL
Cholesterol: 122 mg/dL (ref 0–200)
HDL: 48 mg/dL (ref >40)
LDL Cholesterol: 64 mg/dL (ref 0–99)
Total CHOL/HDL Ratio: 2.5 ratio
Triglycerides: 50 mg/dL (ref <150)
VLDL: 10 mg/dL (ref 0–40)

## 2023-11-26 MED ORDER — POTASSIUM CHLORIDE CRYS ER 20 MEQ PO TBCR
40.0000 meq | EXTENDED_RELEASE_TABLET | Freq: Once | ORAL | Status: AC
  Administered 2023-11-26: 40 meq via ORAL
  Filled 2023-11-26: qty 2

## 2023-11-26 MED ORDER — ROSUVASTATIN CALCIUM 20 MG PO TABS
40.0000 mg | ORAL_TABLET | Freq: Every day | ORAL | Status: DC
  Administered 2023-11-27: 40 mg via ORAL
  Filled 2023-11-26: qty 2

## 2023-11-26 MED ORDER — AMLODIPINE BESYLATE 5 MG PO TABS
5.0000 mg | ORAL_TABLET | Freq: Every day | ORAL | Status: DC
  Administered 2023-11-26 – 2023-11-27 (×2): 5 mg via ORAL
  Filled 2023-11-26 (×2): qty 1

## 2023-11-26 NOTE — Evaluation (Signed)
 Occupational Therapy Evaluation Patient Details Name: Kevin [name] MRN: [id number] DOB: 09/15/1951 Today's Date: 11/26/2023   History of Present Illness   Pt is a 73 y/o M presenting to ED on 3/3 with midsternal chest pain, likely due to elevated LVEDP and A fib with diastolic heart failure. PMH includes A fib on Eliquis, cholelithaisis, CAD, cocaine use, claudication, CHF, thrombocytopenia, PAD, HTN, alcohol use, NSVT, CVA with residual R deficits     Clinical Impressions Pt reports having assist at baseline with bathing and IADLs, pt furniture walks in home, but reports he sits up in his w/c most of the day. Pt live with ex-wife who has been assisting him PRN. Pt currently needing CGA-min A for ADLs, mod I for bed mobility and CGA for transfers with 1 person HHA. Pt educated on use of 3in1 as shower seat for home as pt reports difficulty with stepping over tub wall at home, pt able to simulate tub transfer with CGA during session. Pt presenting with impairments listed below, will follow acutely. Recommend HHOT at d/c.     If plan is discharge home, recommend the following:   A little help with walking and/or transfers;A little help with bathing/dressing/bathroom;Assistance with cooking/housework;Direct supervision/assist for medications management;Direct supervision/assist for financial management;Assist for transportation     Functional Status Assessment   Patient has had a recent decline in their functional status and demonstrates the ability to make significant improvements in function in a reasonable and predictable amount of time.     Equipment Recommendations   BSC/3in1     Recommendations for Other Services   PT consult     Precautions/Restrictions   Precautions Precautions: Fall Restrictions Weight Bearing Restrictions Per Provider Order: No     Mobility Bed Mobility Overal bed mobility: Modified Independent             General bed mobility  comments: use of rails    Transfers Overall transfer level: Needs assistance Equipment used: 1 person hand held assist Transfers: Sit to/from Stand Sit to Stand: Contact guard assist                  Balance Overall balance assessment: Needs assistance Sitting-balance support: Feet supported Sitting balance-Leahy Scale: Good Sitting balance - Comments: reaches outside BOS withour LOB   Standing balance support: During functional activity Standing balance-Leahy Scale: Fair                             ADL either performed or assessed with clinical judgement   ADL Overall ADL's : Needs assistance/impaired Eating/Feeding: Set up;Sitting   Grooming: Set up;Standing   Upper Body Bathing: Minimal assistance   Lower Body Bathing: Minimal assistance   Upper Body Dressing : Minimal assistance   Lower Body Dressing: Minimal assistance   Toilet Transfer: Contact guard assist;Ambulation;Regular Toilet       Tub/ Shower Transfer: Contact guard assist;Tub transfer   Functional mobility during ADLs: Contact guard assist       Vision   Vision Assessment?: No apparent visual deficits     Perception Perception: Not tested       Praxis Praxis: Not tested       Pertinent Vitals/Pain Pain Assessment Pain Assessment: No/denies pain     Extremity/Trunk Assessment Upper Extremity Assessment Upper Extremity Assessment: Generalized weakness;RUE deficits/detail RUE Deficits / Details: mild weakness compared to LUE from prior CVA   Lower Extremity Assessment Lower Extremity Assessment: Defer to  PT evaluation   Cervical / Trunk Assessment Cervical / Trunk Assessment: Normal   Communication Communication Communication: No apparent difficulties   Cognition Arousal: Alert Behavior During Therapy: WFL for tasks assessed/performed Cognition: No family/caregiver present to determine baseline             OT - Cognition Comments: A & O x4, follows  commands                 Following commands: Intact       Cueing  General Comments   Cueing Techniques: Verbal cues;Gestural cues  VSS   Exercises     Shoulder Instructions      Home Living Family/patient expects to be discharged to:: Private residence Living Arrangements: Spouse/significant other (ex wife) Available Help at Discharge: Family;Available PRN/intermittently (ex wife works,) Type of Home: House Home Access: Stairs to enter [employer] of Steps: 1   Home Layout: One level     Bathroom Shower/Tub: Tub/shower unit         Home Equipment: Wheelchair - Forensic psychologist (2 wheels);Rollator (4 wheels)   Additional Comments: pt reports his ex wife is taking a job in [place] "before the school year ends" and will not be able to assist/care for pt      Prior Functioning/Environment Prior Level of Function : Independent/Modified Independent             Mobility Comments: reports sitting in the w/c most of the day, ambulates household distances/furniture walking ADLs Comments: some assist needed with ADLs, bathing, assist for IADLs    OT Problem List: Decreased strength;Decreased range of motion;Decreased activity tolerance;Impaired balance (sitting and/or standing);Decreased safety awareness   OT Treatment/Interventions: Self-care/ADL training;Therapeutic exercise;Energy conservation;DME and/or AE instruction;Therapeutic activities;Patient/family education;Balance training      OT Goals(Current goals can be found in the care plan section)   Acute Rehab OT Goals Patient Stated Goal: none stated OT Goal Formulation: With patient Time For Goal Achievement: 12/10/23 Potential to Achieve Goals: Good ADL Goals Pt Will Perform Upper Body Dressing: with modified independence;sitting Pt Will Perform Lower Body Dressing: with modified independence;sitting/lateral leans;sit to/from stand Pt Will Transfer to Toilet: with modified  independence;ambulating;regular height toilet Pt Will Perform Tub/Shower Transfer: Tub transfer;Shower transfer;3 in 1;ambulating;with modified independence Additional ADL Goal #1: pt will verbalize x3 energy conservation strategies in prep for ADLs   OT Frequency:  Min 2X/week    Co-evaluation              AM-PAC OT "6 Clicks" Daily Activity     Outcome Measure Help from another person eating meals?: A Little Help from another person taking care of personal grooming?: A Little Help from another person toileting, which includes using toliet, bedpan, or urinal?: A Little Help from another person bathing (including washing, rinsing, drying)?: A Little Help from another person to put on and taking off regular upper body clothing?: A Little Help from another person to put on and taking off regular lower body clothing?: A Little 6 Click Score: 18   End of Session Equipment Utilized During Treatment: Gait belt Nurse Communication: Mobility status  Activity Tolerance: Patient tolerated treatment well Patient left: in bed;with call bell/phone within reach;with bed alarm set  OT Visit Diagnosis: Unsteadiness on feet (R26.81);Other abnormalities of gait and mobility (R26.89);Muscle weakness (generalized) (M62.81)                Time: 3086-5784 OT Time Calculation (min): 27 min Charges:  OT General Charges $OT Visit: 1  Visit OT Evaluation $OT Eval Moderate Complexity: 1 Mod OT Treatments $Self Care/Home Management : 8-22 mins  Carver Fila, OTD, OTR/L SecureChat Preferred Acute Rehab [phone]   Dalphine Handing 11/26/2023, 4:31 PM

## 2023-11-26 NOTE — Progress Notes (Addendum)
 Patient Name: Kevin [name] Date of Encounter: 11/26/2023 [facility] Cardiologist: None   Interval Summary  .    No chest pain this morning, was able to walk the hallway.   Vital Signs .    Vitals:   11/26/23 0012 11/26/23 0356 11/26/23 0740 11/26/23 0755  BP: (!) 147/100 (!) 147/102 (!) 143/84   Pulse: 97 86 95   Resp: 20 18 18    Temp: 98.1 F (36.7 C) 98.2 F (36.8 C) 98 F (36.7 C)   TempSrc: Oral Oral Oral   SpO2: 98% 100% 99% 100%  Weight: 89.1 kg     Height:        Intake/Output Summary (Last 24 hours) at 11/26/2023 1050 Last data filed at 11/26/2023 0016 Gross per 24 hour  Intake 120 ml  Output 1500 ml  Net -1380 ml      11/26/2023   12:12 AM 11/25/2023    1:54 PM 11/24/2023   10:04 PM  Last 3 Weights  Weight (lbs) 196 lb 8 oz 196 lb 3.4 oz 210 lb 1.6 oz  Weight (kg) 89.132 kg 89 kg 95.3 kg      Telemetry/ECG    Atrial fibrillation, rates elevated at times in the 110s - Personally Reviewed  Physical Exam .    GEN: No acute distress.   Neck: No JVD Cardiac: Irreg Irreg, no murmurs, rubs, or gallops.  Respiratory: Clear to auscultation bilaterally. GI: Soft, nontender, non-distended  MS: No edema  Assessment & Plan .     73 y.o. male with a hx of dementia, PAD, CAD, severe HTN, polysubstance abuse (cocaine and ETOH), tobacco use, persistent Afib (on Eliquis), TIA, Hepatitis C and NICM with recovered EF who is being seen 11/25/2023 for the evaluation of elevated troponin at the request of medicine.   Elevated troponin CAD -- Cath in 1/16 for abnormal Myoview. Found 90% tubular mid-ramus stenosis and 70% bifurcation PDA/PLB stenosis. Treated medically.  -- presented with chest pain and shortness of breath, though this has been present for several months. According to notes, he has a hx of chronic angina  -- reports being pain free at the time of arrival to the ED, no pain since  -- hsTn 65>>181 (though in the setting of afib RVR, suspect could  be demand) -- echo showed LVEF of 55-60%, no rWMA, moderate basal-septal LVH, mildly reduced RV -- given his issues with dementia, and significant thrombocytopenia he would be high risk for invasive evaluation and antiplatelet therapy. Continue medical management   Atrial fibrillation with RVR Hx of persistent afib  -- EKG on admission with afib 111bpm, ST depression and TWI in inferolateral leads (unchanged from prior) -- rates in the 90-110s -- reports compliance with home Eliquis (ex-wife is his caregiver)   NICM with recovered EF -- echo 02/2022 with LVEF of 60-65%, moderate LVH, normal RV, severe LAE -- does not appear significantly volume overloaded on exam -- GDMT: coreg 18.75mg  BID, entresto 97-103mg  BID, jardiance 10mg  daily, spiro 25mg  daily, resume amlodipine 5mg  daily    HTN -- elevated  -- continue coreg 18.75mg  BID, entresto 97-103mg  BID, jardiance 10mg  daily, spiro 25mg  daily, resume amlodipine 5mg  daily    HLD -- on crestor 20mg  daily   Polysubstance abuse Thrombocytopenia  Hep C TIA  He does report concern about going home, reports he has been with his ex-wife but she is moving and he's concerned about not being able to stay where he is.   For  questions or updates, please contact [facility] Please consult [email or web address] for contact info under        Signed, Laverda Page, NP    Patient seen and examined. Agree with assessment and plan.  No recurrent chest pain.  Blood pressure continues to be elevated at 145/90.  Telemetry reveals atrial fibrillation with heart rate around 88-95. I/O: -1380. No shortness of breath.  Platelet count today 67,000.  Troponin 65 > 181.  Medical management.  Plan resume amlodipine.   Lennette Bihari, MD, [facility] 11/26/2023 12:19 PM

## 2023-11-26 NOTE — Progress Notes (Signed)
.  Mobility Specialist Progress Note:   11/26/23 1145  Mobility  Activity Ambulated with assistance in hallway  Level of Assistance Contact guard assist, steadying assist  Assistive Device None  Distance Ambulated (ft) 200 ft  Activity Response Tolerated well  Mobility Referral Yes  Mobility Specialist Start Time (ACUTE ONLY) 1145  Mobility Specialist Stop Time (ACUTE ONLY) 1158  Mobility Specialist Time Calculation (min) (ACUTE ONLY) 13 min   Pt agreeable to mobility session. Required only contact assist during ambulation with no AD. Pt states he feels at baseline. No overt LOB noted. Pt back in bed with all needs met, providers in room.  Addison Lank Mobility Specialist Please contact via SecureChat or  Rehab office at [phone]

## 2023-11-26 NOTE — Progress Notes (Signed)
 PROGRESS NOTE  Kevin [name]  DOB: 20-Jul-1951  PCP: Default, Provider, MD WUJ:[id number]  DOA: 11/24/2023  LOS: 0 days  Hospital Day: 3  Brief narrative: Kevin [name] is a 73 y.o. male with PMH significant for HTN, HLD, CAD, NICM with recovered EF, PAD, permanent A-fib on Eliquis, TIA, mild cognitive impairment, h/o polysubstance abuse (cocaine, tobacco), hep C. Patient presented to the ED with complaint of acute worsening of chronic chest pain. Patient has history of nonischemic cardiomyopathy likely related to tachyarrhythmia.  EF was as low as 35% and later recovered.  Was seen by cardiologist Dr. Gala Romney in the past. Patient has ongoing chest pain for many months, usually is exertional and improves with rest.  Onset with some dyspnea. 3/3, patient had recurrence of chest pain.  Also was diaphoretic.  EMS was called, given aspirin and nitro with some improvement.  Brought to the ED.  In the ED, patient afebrile, heart rate in 100s, blood pressure 148/95, breathing on room air Labs with WC count 11, platelet low at 58, potassium elevated to 5.4, BUN/creatinine 17/1.32, BNP 265, troponin 65>181 Urine drug screen positive for [facility] Chest x-ray without active disease EKG with A-fib at 111 bpm, QTc prolonged to 518 ms  Patient was given IV Lasix, 1 dose of Lokelma oral. Seen by cardiology. Admitted to [facility]  Subjective: Patient was seen and examined this morning.  Pleasant elderly African-American male.  Lying on bed.  Not in distress.  Not on supplemental oxygen.  Family not at bedside.  Assessment/Plan: Elevated troponin H/o CAD Presented with acute on chronic chest pain, shortness of breath. Troponin elevated but in the setting of A-fib with RVR Cardiology consult appreciated. Recommended no heparin at this time given significant thrombocytopenia.  Unclear if patient would benefit from cardiac cath given dementia Echocardiogram without WMA Continue Eliquis and  Crestor.  A-fib with RVR H/o persistent A-fib Presented with heart rate of 111. Heart rate gradually improving. Currently continued on Coreg. Chronically anticoagulated with Eliquis  NICM with recovered EF Hypertension Echo June 2022 with EF 60 to 65%, moderate LVH, normal RV was severely LAE On exam, has 1+ bilateral pedal edema. Given IV Lasix x 2 yesterday.  More than 1.3 L of negative balance. Cardiology consulted PTA meds- Coreg, Entresto, Jardiance, Aldactone, amlodipine.  Was not on Lasix at home Currently continued on Coreg,, Entresto, Jardiance, Aldactone and amlodipine Net IO Since Admission: -1,380 mL [11/26/23 1116] Continue to monitor for daily intake output, weight, blood pressure, BNP, renal function and electrolytes. Recent Labs  Lab 11/24/23 2250 11/25/23 0115 11/26/23 0227  BNP  --  265.4*  --   BUN 17  --  14  CREATININE 1.32*  --  1.13  NA 136  --  137  K 5.4*  --  3.2*   PAD, TIA HLD Eliquis and Crestor  h/o polysubstance abuse (cocaine, tobacco, THC) UDS positive for THC counseled to quit  Prolonged Qtc Initially with QTc prolonged to 518 ms Monitor.  Avoid QTc prolonging meds Repeat EKG tomorrow  Hyperkalemia/hypokalemia Potassium level was elevated on admission.  Given IV Lasix, 1 dose of Lokelma oral.  Repeat labs in a.m. showed potassium level low at 3.2.  1 dose of replacement given Recent Labs  Lab 11/24/23 2250 11/26/23 0227  K 5.4* 3.2*   GERD PPI  Mobility: Encourage ambulation  Goals of care:   Code Status: Full Code    DVT prophylaxis:   apixaban (ELIQUIS) tablet 5 mg  Antimicrobials: None Fluid: None Consultants: Cardiology Family Communication: Wife not at bedside.  Status: Observation Level of care:  Telemetry Cardiac   Patient is from: Home Needs to continue in-hospital care: Ongoing diuresis, Anticipated d/c to: Pending clinical course.  Home versus SNF.  Pending PT eval    Diet:  Diet Order              Diet 2 gram sodium Room service appropriate? Yes; Fluid consistency: Thin  Diet effective now                   Scheduled Meds:  amLODipine  5 mg Oral Daily   apixaban  5 mg Oral BID   carvedilol  18.75 mg Oral BID WC   empagliflozin  10 mg Oral Daily   fluticasone furoate-vilanterol  1 puff Inhalation Daily   And   umeclidinium bromide  1 puff Inhalation Daily   rosuvastatin  20 mg Oral Daily   sacubitril-valsartan  1 tablet Oral BID   spironolactone  12.5 mg Oral Daily    PRN meds: acetaminophen **OR** acetaminophen, albuterol, bisacodyl, hydrALAZINE, pantoprazole, polyethylene glycol   Infusions:    Antimicrobials: Anti-infectives (From admission, onward)    None       Objective: Vitals:   11/26/23 0740 11/26/23 0755  BP: (!) 143/84   Pulse: 95   Resp: 18   Temp: 98 F (36.7 C)   SpO2: 99% 100%    Intake/Output Summary (Last 24 hours) at 11/26/2023 1116 Last data filed at 11/26/2023 0016 Gross per 24 hour  Intake 120 ml  Output 1500 ml  Net -1380 ml   Filed Weights   11/24/23 2204 11/25/23 1354 11/26/23 0012  Weight: 95.3 kg 89 kg 89.1 kg   Weight change: -6.3 kg Body mass index is 26.65 kg/m.   Physical Exam: General exam: Pleasant, elderly African-American male.  Not in distress Skin: No rashes, lesions or ulcers. HEENT: Atraumatic, normocephalic, no obvious bleeding Lungs: Clear to auscultation bilaterally,  CVS: Irregular tach, no murmur,   GI/Abd: Soft, nontender, nondistended, bowel sound present,   CNS: Alert, awake, slow to respond, oriented to place and person Psychiatry: Sad affect Extremities: 1+ bilateral pedal edema, no calf tenderness  Data Review: I have personally reviewed the laboratory data and studies available.  F/u labs  Unresulted Labs (From admission, onward)     Start     Ordered   11/27/23 0500  Basic metabolic panel  Tomorrow morning,   R        11/26/23 1116   11/27/23 0500  Magnesium  Tomorrow morning,   R         11/26/23 1116   11/27/23 0500  Phosphorus  Tomorrow morning,   R        11/26/23 1116            Total time spent in review of labs and imaging, patient evaluation, formulation of plan, documentation and communication with family: 45 minutes  Signed, Lorin Glass, MD [facility] 11/26/2023

## 2023-11-27 DIAGNOSIS — R0789 Other chest pain: Secondary | ICD-10-CM | POA: Diagnosis not present

## 2023-11-27 DIAGNOSIS — R079 Chest pain, unspecified: Secondary | ICD-10-CM | POA: Diagnosis not present

## 2023-11-27 LAB — BASIC METABOLIC PANEL
Anion gap: 11 (ref 5–15)
BUN: 18 mg/dL (ref 8–23)
CO2: 22 mmol/L (ref 22–32)
Calcium: 8.2 mg/dL — ABNORMAL LOW (ref 8.9–10.3)
Chloride: 104 mmol/L (ref 98–111)
Creatinine, Ser: 1.27 mg/dL — ABNORMAL HIGH (ref 0.61–1.24)
GFR, Estimated: >60 mL/min (ref >60)
Glucose, Bld: 105 mg/dL — ABNORMAL HIGH (ref 70–99)
Potassium: 3.8 mmol/L (ref 3.5–5.1)
Sodium: 137 mmol/L (ref 135–145)

## 2023-11-27 LAB — MAGNESIUM: Magnesium: 1.9 mg/dL (ref 1.7–2.4)

## 2023-11-27 LAB — PHOSPHORUS: Phosphorus: 2.5 mg/dL (ref 2.5–4.6)

## 2023-11-27 MED ORDER — FUROSEMIDE 40 MG PO TABS: 40.0000 mg | ORAL_TABLET | Freq: Every day | ORAL | 2 refills | Status: AC

## 2023-11-27 NOTE — Discharge Summary (Signed)
 Physician Discharge Summary  Kevin [name]:[id number] DOB: [birth year] DOA: 11/24/2023  PCP: Default, Provider, MD  Admit date: 11/24/2023 Discharge date: 11/27/2023  Admitted From: Home Discharge disposition: Home with home health PT/OT  Recommendations at discharge:  Ensure compliance with cardiac meds including lasix.   Brief narrative: Kevin [name] is a 73 y.o. [name] with PMH significant for HTN, HLD, CAD, NICM with recovered EF, PAD, permanent A-fib on Eliquis, TIA, mild cognitive impairment, h/o polysubstance abuse (cocaine, tobacco), hep C. Patient presented to the ED with complaint of acute worsening of chronic chest pain. Patient has history of nonischemic cardiomyopathy likely related to tachyarrhythmia.  EF was as low as 35% and later recovered.  Was seen by cardiologist Dr. Gala [name] in the past. Patient has ongoing chest pain for many months, usually is exertional and improves with rest.  Onset with some dyspnea. 3/3, patient had recurrence of chest pain.  Also was diaphoretic.  EMS was called, given aspirin and nitro with some improvement.  Brought to the ED.  In the ED, patient afebrile, heart rate in 100s, blood pressure 148/95, breathing on room air Labs with WC count 11, platelet low at 58, potassium elevated to 5.4, BUN/creatinine 17/1.32, BNP 265, troponin 65>181 Urine drug screen positive for [facility] ([facility]Rh) Chest x-ray without active disease EKG with A-fib at 111 bpm, QTc prolonged to 518 ms  Patient was given IV Lasix, 1 dose of Lokelma oral. Seen by cardiology. Admitted to [facility]  Subjective: Patient was seen and examined this morning.   Kevin [name].   Walking on the hallway with mobility.  Some limping noted but otherwise no assistance needed.  Did not require supplemental oxygen. Pedal edema improving  Hospital course: Elevated troponin H/o CAD Presented with acute on chronic chest pain, shortness of breath. Troponin elevated but  in the setting of A-fib with RVR Cardiology consult appreciated. Recommended no heparin at this time given significant thrombocytopenia.  Unclear if patient would benefit from cardiac cath given dementia Echocardiogram without WMA Continue Eliquis and Crestor.  A-fib with RVR H/o persistent A-fib Presented with heart rate of 111. Heart rate gradually improving. Currently continued on Coreg. Chronically anticoagulated with Eliquis  NICM with recovered EF Hypertension Echo June 2022 with EF 60 to 65%, moderate LVH, normal RV was severely LAE On exam, has 1+ bilateral pedal edema. Given IV Lasix. More than 3 L of negative balance. Cardiology consulted PTA meds- Coreg, Entresto, Jardiance, Aldactone, amlodipine.  Was not complaint to Lasix at home Currently continued on Coreg, Entresto, Jardiance, Aldactone and amlodipine. D/w Dr. Tresa Endo today. Resume lasix post discharge.  Net IO Since Admission: -3,510 mL [11/27/23 1726] Continue to monitor for daily intake output, weight, blood pressure, BNP, renal function and electrolytes. Recent Labs  Lab 11/24/23 2250 11/25/23 0115 11/26/23 0227 11/27/23 0213  BNP  --  265.4*  --   --   BUN 17  --  14 18  CREATININE 1.32*  --  1.13 1.27*  NA 136  --  137 137  K 5.4*  --  3.2* 3.8  MG  --   --   --  1.9   PAD, TIA HLD Eliquis and Crestor  h/o polysubstance abuse (cocaine, tobacco, THC) UDS positive for THC counseled to quit  Prolonged Qtc Initially with QTc prolonged to 518 ms Monitor.  Avoid QTc prolonging meds Repeat EKG tomorrow  Hyperkalemia/hypokalemia Potassium level normalized today.   Recent Labs  Lab 11/24/23 2250 11/26/23 0227 11/27/23 6295  K 5.4* 3.2* 3.8  MG  --   --  1.9  PHOS  --   --  2.5   GERD PPI  Impaired mobility Saw him walking on the hallway noted limping.  Home health PT OT ordered  Goals of care:   Code Status: Full Code    Consultants: Cardiology Family Communication: Wife not at  bedside.  Diet:  Diet Order             Diet - low sodium heart healthy           Diet 2 gram sodium Room service appropriate? Yes; Fluid consistency: Thin  Diet effective now                   Nutritional status:  Body mass index is 26.16 kg/m.       Wounds:  -    Discharge Exam:   Vitals:   11/27/23 0359 11/27/23 0809 11/27/23 0830 11/27/23 1611  BP: 134/89 (!) 136/91 117/77 120/80  Pulse: 84 90 75 85  Resp: 19 18 16 16   Temp: 97.9 F (36.6 C)  98.3 F (36.8 C) 97.8 F (36.6 C)  TempSrc: Oral  Oral Oral  SpO2: 99% 100% 100% 99%  Weight: 87.5 kg     Height:        Body mass index is 26.16 kg/m.  General exam: Kevin, elderly African-American [name].  Not in distress Skin: No rashes, lesions or ulcers. HEENT: Atraumatic, normocephalic, no obvious bleeding Lungs: Clear to auscultation bilaterally,  CVS: Irregular tach, no murmur,   GI/Abd: Soft, nontender, nondistended, bowel sound present,   CNS: Alert, awake, slow to respond, oriented to place and person Psychiatry: Sad affect Extremities: Improving bilateral pedal edema, no calf tenderness  Follow ups:    Follow-up Information      [facility] Follow up.   Contact information: [address] [place] [address] [phone]                Discharge Instructions:   Discharge Instructions     Call MD for:  difficulty breathing, headache or visual disturbances   Complete by: As directed    Call MD for:  extreme fatigue   Complete by: As directed    Call MD for:  hives   Complete by: As directed    Call MD for:  persistant dizziness or light-headedness   Complete by: As directed    Call MD for:  persistant nausea and vomiting   Complete by: As directed    Call MD for:  severe uncontrolled pain   Complete by: As directed    Call MD for:  temperature >100.4   Complete by: As directed    Diet - low sodium heart healthy    Complete by: As directed    Discharge instructions   Complete by: As directed    Recommendations at discharge:   Ensure compliance with cardiac meds including lasix.  Discharge instructions for CHF Check weight daily -preferably same time every day. Restrict fluid intake to 1200 ml daily Restrict salt intake to less than 2 g daily. Call MD if you have one of the following symptoms 1) 3 pound weight gain in 24 hours or 5 pounds in 1 week  2) swelling in the hands, feet or stomach  3) progressive shortness of breath 4) if you have to sleep on extra pillows at night in order to breathe  General discharge instructions: Follow with Primary MD Default, Provider, MD in 7 days  Please request your PCP  to go over your hospital tests, procedures, radiology results at the follow up. Please get your medicines reviewed and adjusted.  Your PCP may decide to repeat certain labs or tests as needed. Do not drive, operate heavy machinery, perform activities at heights, swimming or participation in water activities or provide baby sitting services if your were admitted for syncope or siezures until you have seen by Primary MD or a Neurologist and advised to do so again. [place] Controlled Substance Reporting System database was reviewed. Do not drive, operate heavy machinery, perform activities at heights, swim, participate in water activities or provide baby-sitting services while on medications for pain, sleep and mood until your outpatient physician has reevaluated you and advised to do so again.  You are strongly recommended to comply with the dose, frequency and duration of prescribed medications. Activity: As tolerated with Full fall precautions use walker/cane & assistance as needed Avoid using any recreational substances like cigarette, tobacco, alcohol, or non-prescribed drug. If you experience worsening of your admission symptoms, develop shortness of breath, life threatening  emergency, suicidal or homicidal thoughts you must seek medical attention immediately by calling 911 or calling your MD immediately  if symptoms less severe. You must read complete instructions/literature along with all the possible adverse reactions/side effects for all the medicines you take and that have been prescribed to you. Take any new medicine only after you have completely understood and accepted all the possible adverse reactions/side effects.  Wear Seat belts while driving. You were cared for by a hospitalist during your hospital stay. If you have any questions about your discharge medications or the care you received while you were in the hospital after you are discharged, you can call the unit and ask to speak with the hospitalist or the covering physician. Once you are discharged, your primary care physician will handle any further medical issues. Please note that NO REFILLS for any discharge medications will be authorized once you are discharged, as it is imperative that you return to your primary care physician (or establish a relationship with a primary care physician if you do not have one).   Increase activity slowly   Complete by: As directed        Discharge Medications:   Allergies as of 11/27/2023   No Known Allergies      Medication List     TAKE these medications    albuterol 108 (90 Base) MCG/ACT inhaler Commonly known as: VENTOLIN HFA Inhale 2 puffs into the lungs every 6 (six) hours as needed for wheezing or shortness of breath.   amLODipine 5 MG tablet Commonly known as: NORVASC Take 5 mg by mouth daily.   apixaban 5 MG Tabs tablet Commonly known as: Eliquis Take 1 tablet (5 mg total) by mouth 2 (two) times daily. NEEDS FOLLOW UP APPOINTMENT FOR MORE REFILLS   carvedilol 12.5 MG tablet Commonly known as: COREG TAKE 1.5 TABLETS (18.75 MG TOTAL) BY MOUTH 2 (TWO) TIMES DAILY WITH A MEAL.   Entresto 97-103 MG Generic drug: sacubitril-valsartan TAKE 1  TABLET BY MOUTH 2 (TWO) TIMES DAILY. NEEDS FOLLOW UP APPOINTMENT FOR MORE REFILLS   furosemide 40 MG tablet Commonly known as: LASIX Take 1 tablet (40 mg total) by mouth daily.   Jardiance 10 MG Tabs tablet Generic drug: empagliflozin TAKE 1 TABLET BY MOUTH EVERY DAY BEFORE BREAKFAST   multivitamin with minerals  Tabs tablet Take 1 tablet by mouth daily. Men's One a Day   pantoprazole 20 MG tablet Commonly known as: PROTONIX Take 20 mg by mouth daily as needed for heartburn or indigestion.   rosuvastatin 20 MG tablet Commonly known as: CRESTOR TAKE 1 TABLET BY MOUTH EVERY DAY   spironolactone 25 MG tablet Commonly known as: ALDACTONE TAKE 1/2 TABLET BY MOUTH EVERY DAY   Trelegy Ellipta 100-62.5-25 MCG/INH Aepb Generic drug: Fluticasone-Umeclidin-Vilant Take 1 puff by mouth daily.         The results of significant diagnostics from this hospitalization (including imaging, microbiology, ancillary and laboratory) are listed below for reference.    Procedures and Diagnostic Studies:   ECHOCARDIOGRAM COMPLETE Result Date: 11/25/2023    ECHOCARDIOGRAM REPORT   Patient Name:   Kevin [name] Date of Exam: 11/25/2023 Medical Rec #:  [id number]         Height:       72.0 in Accession #:    [id number]        Weight:       210.1 lb Date of Birth:  1951/07/30         BSA:          2.176 m Patient Age:    73 years          BP:           184/152 mmHg Patient Gender: M                 HR:           94 bpm. Exam Location:  Inpatient Procedure: 2D Echo, Cardiac Doppler and Color Doppler (Both Spectral and Color            Flow Doppler were utilized during procedure). Indications:    Chest pain  History:        Patient has prior history of Echocardiogram examinations, most                 recent 03/04/2022. CHF, CAD, PAD and TIA, Arrythmias:Atrial                 Fibrillation, Signs/Symptoms:Chest Pain; Risk                 Factors:Hypertension and Former Smoker. CKD, NSVT, DOE.  Sonographer:     Vern Claude Referring Phys: [phone] LINDSAY B ROBERTS IMPRESSIONS  1. Left ventricular ejection fraction, by estimation, is 55 to 60%. The left ventricle has normal function. The left ventricle has no regional wall motion abnormalities. There is moderate asymmetric left ventricular hypertrophy of the basal-septal segment. Left ventricular diastolic function could not be evaluated.  2. Right ventricular systolic function is mildly reduced. The right ventricular size is normal. Tricuspid regurgitation signal is inadequate for assessing PA pressure.  3. Left atrial size was mildly dilated.  4. The mitral valve is grossly normal. Mild mitral valve regurgitation. No evidence of mitral stenosis.  5. The aortic valve is tricuspid. There is mild calcification of the aortic valve. There is mild thickening of the aortic valve. Aortic valve regurgitation is trivial. Aortic valve sclerosis/calcification is present, without any evidence of aortic stenosis.  6. The inferior vena cava is normal in size with greater than 50% respiratory variability, suggesting right atrial pressure of 3 mmHg. Comparison(s): No significant change from prior study. FINDINGS  Left Ventricle: Left ventricular ejection fraction, by estimation, is 55 to 60%. The left ventricle has normal function. The left ventricle has no  regional wall motion abnormalities. The left ventricular internal cavity size was normal in size. There is  moderate asymmetric left ventricular hypertrophy of the basal-septal segment. Left ventricular diastolic function could not be evaluated due to atrial fibrillation. Left ventricular diastolic function could not be evaluated. Right Ventricle: The right ventricular size is normal. No increase in right ventricular wall thickness. Right ventricular systolic function is mildly reduced. Tricuspid regurgitation signal is inadequate for assessing PA pressure. Left Atrium: Left atrial size was mildly dilated. Right Atrium: Right atrial  size was normal in size. Pericardium: There is no evidence of pericardial effusion. Mitral Valve: The mitral valve is grossly normal. Mild mitral valve regurgitation. No evidence of mitral valve stenosis. MV peak gradient, 3.1 mmHg. The mean mitral valve gradient is 1.0 mmHg. Tricuspid Valve: The tricuspid valve is grossly normal. Tricuspid valve regurgitation is not demonstrated. No evidence of tricuspid stenosis. Aortic Valve: The aortic valve is tricuspid. There is mild calcification of the aortic valve. There is mild thickening of the aortic valve. Aortic valve regurgitation is trivial. Aortic valve sclerosis/calcification is present, without any evidence of aortic stenosis. Aortic valve mean gradient measures 1.7 mmHg. Aortic valve peak gradient measures 3.1 mmHg. Aortic valve area, by VTI measures 2.32 cm. Pulmonic Valve: The pulmonic valve was grossly normal. Pulmonic valve regurgitation is not visualized. No evidence of pulmonic stenosis. Aorta: The aortic root and ascending aorta are structurally normal, with no evidence of dilitation. Venous: The inferior vena cava is normal in size with greater than 50% respiratory variability, suggesting right atrial pressure of 3 mmHg. IAS/Shunts: The atrial septum is grossly normal.  LEFT VENTRICLE PLAX 2D LVIDd:         4.50 cm     Diastology LVIDs:         3.10 cm     LV e' medial:    5.78 cm/s LV PW:         0.80 cm     LV E/e' medial:  17.6 LV IVS:        1.50 cm     LV e' lateral:   9.75 cm/s LVOT diam:     1.90 cm     LV E/e' lateral: 10.5 LV SV:         29 LV SV Index:   13 LVOT Area:     2.84 cm  LV Volumes (MOD) LV vol d, MOD A2C: 95.3 ml LV vol d, MOD A4C: 65.1 ml LV vol s, MOD A2C: 45.9 ml LV vol s, MOD A4C: 26.5 ml LV SV MOD A2C:     49.4 ml LV SV MOD A4C:     65.1 ml LV SV MOD BP:      48.8 ml RIGHT VENTRICLE            IVC RV Basal diam:  2.00 cm    IVC diam: 1.00 cm RV Mid diam:    1.30 cm RV S prime:     7.73 cm/s TAPSE (M-mode): 1.3 cm LEFT ATRIUM              Index        RIGHT ATRIUM          Index LA diam:        4.40 cm 2.02 cm/m   RA Area:     5.11 cm LA Vol (A2C):   83.0 ml 38.15 ml/m  RA Volume:   5.56 ml  2.56 ml/m LA Vol (A4C):   74.3 ml  34.15 ml/m LA Biplane Vol: 86.3 ml 39.67 ml/m  AORTIC VALVE                    PULMONIC VALVE AV Area (Vmax):    1.81 cm     PV Vmax:       0.68 m/s AV Area (Vmean):   1.83 cm     PV Peak grad:  1.8 mmHg AV Area (VTI):     2.32 cm AV Vmax:           88.43 cm/s AV Vmean:          59.767 cm/s AV VTI:            0.123 m AV Peak Grad:      3.1 mmHg AV Mean Grad:      1.7 mmHg LVOT Vmax:         56.60 cm/s LVOT Vmean:        38.600 cm/s LVOT VTI:          0.101 m LVOT/AV VTI ratio: 0.82  AORTA Ao Root diam: 3.10 cm Ao Asc diam:  3.90 cm MITRAL VALVE MV Area (PHT): 4.96 cm     SHUNTS MV Area VTI:   1.82 cm     Systemic VTI:  0.10 m MV Peak grad:  3.1 mmHg     Systemic Diam: 1.90 cm MV Mean grad:  1.0 mmHg MV Vmax:       0.88 m/s MV Vmean:      40.2 cm/s MV Decel Time: 153 msec MV E velocity: 102.00 cm/s Lennie Odor MD Electronically signed by Lennie Odor MD Signature Date/Time: 11/25/2023/2:48:21 PM    Final    DG Chest Port 1 View Result Date: 11/25/2023 CLINICAL DATA:  Chest pain delete that EXAM: PORTABLE CHEST 1 VIEW COMPARISON:  02/05/2016 FINDINGS: Stable cardiomediastinal silhouette. Aortic atherosclerotic calcification. Elevated right hemidiaphragm. No focal consolidation, pleural effusion, or pneumothorax. No displaced rib fractures. IMPRESSION: No active disease. Electronically Signed   By: Minerva Fester M.D.   On: 11/25/2023 00:52     Labs:   Basic Metabolic Panel: Recent Labs  Lab 11/24/23 2250 11/26/23 0227 11/27/23 0213  NA 136 137 137  K 5.4* 3.2* 3.8  CL 103 103 104  CO2 25 25 22   GLUCOSE 142* 104* 105*  BUN 17 14 18   CREATININE 1.32* 1.13 1.27*  CALCIUM 8.4* 8.2* 8.2*  MG  --   --  1.9  PHOS  --   --  2.5   GFR Estimated Creatinine Clearance: 57.7 mL/min (A) (by C-G  formula based on SCr of 1.27 mg/dL (H)). Liver Function Tests: No results for input(s): "AST", "ALT", "ALKPHOS", "BILITOT", "PROT", "ALBUMIN" in the last 168 hours. No results for input(s): "LIPASE", "AMYLASE" in the last 168 hours. No results for input(s): "AMMONIA" in the last 168 hours. Coagulation profile No results for input(s): "INR", "PROTIME" in the last 168 hours.  CBC: Recent Labs  Lab 11/24/23 2250 11/26/23 0227  WBC 11.0* 5.3  HGB 13.3 13.5  HCT 39.8 39.8  MCV 102.3* 100.3*  PLT 58* 67*   Cardiac Enzymes: No results for input(s): "CKTOTAL", "CKMB", "CKMBINDEX", "TROPONINI" in the last 168 hours. BNP: Invalid input(s): "POCBNP" CBG: No results for input(s): "GLUCAP" in the last 168 hours. D-Dimer No results for input(s): "DDIMER" in the last 72 hours. Hgb A1c No results for input(s): "HGBA1C" in the last 72 hours. Lipid Profile Recent Labs    11/26/23 0227  CHOL 122  HDL 48  LDLCALC 64  TRIG 50  CHOLHDL 2.5   Thyroid function studies No results for input(s): "TSH", "T4TOTAL", "T3FREE", "THYROIDAB" in the last 72 hours.  Invalid input(s): "FREET3" Anemia work up No results for input(s): "VITAMINB12", "FOLATE", "FERRITIN", "TIBC", "IRON", "RETICCTPCT" in the last 72 hours. Microbiology No results found for this or any previous visit (from the past 240 hours).  Time coordinating discharge: 45 minutes  Signed: Brianna Esson  [facility] 11/27/2023, 5:26 PM

## 2023-11-27 NOTE — Evaluation (Signed)
 Physical Therapy Evaluation Patient Details Name: Kevin [name] MRN: [id number] DOB: 1951/07/13 Today's Date: 11/27/2023  History of Present Illness  Pt is a 73 y.o. M presenting to ED on 3/3 with midsternal chest pain, likely due to elevated LVEDP and A fib with diastolic heart failure. PMH includes A fib on Eliquis, cholelithaisis, CAD, cocaine use, claudication, CHF, thrombocytopenia, PAD, HTN, HLD,  alcohol use, NSVT, CVA with residual R deficits   Clinical Impression  Pt admitted with above diagnosis. PTA, pt utilized furniture to walk short distances in his home, sat up in his manual w/c most of the day, and was independent with ADLs. He resides in a one story house with his ex-wife that has 2 STE and no railing. Pt relies on his ex-wife for IADLs. Pt currently with functional limitations due to the deficits listed below (see PT Problem List). He completed bed mobility independently and transfers with supervision without an AD. Introduced a RW to improve pt's stability with ambulation. He required CGA for gait and stairs. Discussed the importance of maintaining mobility and what AD is most appropriate in each situation. Recommend pt utilize rollator during gait so he immediately has a place to rest and recover. Pt will benefit from acute skilled PT to increase his independence and safety with mobility to allow d/c Home with HHPT.    If plan is discharge home, recommend the following: A little help with walking and/or transfers;Help with stairs or ramp for entrance;Assistance with cooking/housework   Can travel by private vehicle        Equipment Recommendations None recommended by PT (Pt already has DME)  Recommendations for Other Services       Functional Status Assessment Patient has had a recent decline in their functional status and demonstrates the ability to make significant improvements in function in a reasonable and predictable amount of time.     Precautions / Restrictions  Precautions Precautions: Fall Recall of Precautions/Restrictions: Intact Restrictions Weight Bearing Restrictions Per Provider Order: No      Mobility  Bed Mobility Overal bed mobility: Independent             General bed mobility comments: Pt got out of a flat bed without use of rails.    Transfers Overall transfer level: Needs assistance Equipment used: None Transfers: Sit to/from Stand, Bed to chair/wheelchair/BSC Sit to Stand: Supervision   Step pivot transfers: Supervision       General transfer comment: STS from lowest bed height. Pt pushed up with BUE support. Transfer from EOB to recliner chair to the R with pt using bedrail at FOB for support. Good eccentric control.    Ambulation/Gait Ambulation/Gait assistance: Contact guard assist, Supervision Gait Distance (Feet): 400 Feet (2x15 no AD inside room; 1x400 using RW in hallway) Assistive device: Rolling walker (2 wheels), None Gait Pattern/deviations: Step-through pattern, Decreased stride length, Trunk flexed Gait velocity: Pt reports this is his baseline pace Gait velocity interpretation: <1.31 ft/sec, indicative of household ambulator   General Gait Details: Pt demonstrated furniture walking leaning inside his room. Introduced RW and educated him on sequencing. Pt ambualted with a reciprocal gait pattern, minimal foot clearence, and slightly flex trunk. He maintained body inside the AD at all times. Reduced to supervision with addition of RW. Pt c/o base of foot swelling that felt like he was walking of blisters and BLE pain towards the end of ambulation d/t fatigue.  Stairs Stairs: Yes Stairs assistance: Contact guard assist Stair Management: Two rails, One  rail Right, Forwards, Backwards Number of Stairs: 2 (x3) General stair comments: Pt varied between BUE and unilateral UE support. Ascending: pt went fwds leading with RLE. Descending: pt went bkwds leading with LLE. He was unsteady swaying R/L with  decreased UE support.  Wheelchair Mobility     Tilt Bed    Modified Rankin (Stroke Patients Only)       Balance Overall balance assessment: Needs assistance Sitting-balance support: Feet supported Sitting balance-Leahy Scale: Good Sitting balance - Comments: Pt sat EOB and was able to reach outside his BOS.   Standing balance support: During functional activity, Single extremity supported, Bilateral upper extremity supported Standing balance-Leahy Scale: Fair Standing balance comment: Pt was unsteady during furniture walking in room and on stairs. He benefited from RW for increased stability and required CGA for safety.                             Pertinent Vitals/Pain Pain Assessment Pain Assessment: No/denies pain    Home Living Family/patient expects to be discharged to:: Private residence Living Arrangements: Spouse/significant other (Ex-Wife) Available Help at Discharge: Family;Available PRN/intermittently (Ex-Wife works) Type of Home: House Home Access: Stairs to enter Entrance Stairs-Rails: None Secretary/administrator of Steps: 2   Home Layout: One level Home Equipment: Wheelchair - Forensic psychologist (2 wheels);Rollator (4 wheels);Other (comment) (Pt reports an electric scooter is in the process of being ordered and delievered to him.) Additional Comments: Pt states he never leaves the house unless it is for a doctor's appointment    Prior Function Prior Level of Function : Independent/Modified Independent             Mobility Comments: Pt reports furniture walking inside the home and sits in a manual w/c most of day. Denies falls. ADLs Comments: Indep for ADLs. Pt relies on ex-wife for IADLs, including transportation, medication management, and household management such as cooking and cleaning.     Extremity/Trunk Assessment   Upper Extremity Assessment Upper Extremity Assessment: Defer to OT evaluation    Lower Extremity  Assessment Lower Extremity Assessment: Generalized weakness    Cervical / Trunk Assessment Cervical / Trunk Assessment: Normal  Communication   Communication Communication: Impaired Factors Affecting Communication: Hearing impaired (Hearing Aid died)    Cognition Arousal: Alert Behavior During Therapy: WFL for tasks assessed/performed   PT - Cognitive impairments: No apparent impairments                         Following commands: Intact       Cueing Cueing Techniques: Verbal cues     General Comments General comments (skin integrity, edema, etc.): VSS on RA.    Exercises     Assessment/Plan    PT Assessment Patient needs continued PT services  PT Problem List Decreased strength;Decreased activity tolerance;Decreased balance;Decreased mobility;Decreased knowledge of use of DME;Cardiopulmonary status limiting activity       PT Treatment Interventions DME instruction;Gait training;Stair training;Functional mobility training;Therapeutic activities;Therapeutic exercise;Balance training;Neuromuscular re-education;Patient/family education    PT Goals (Current goals can be found in the Care Plan section)  Acute Rehab PT Goals Patient Stated Goal: Return Home PT Goal Formulation: With patient Time For Goal Achievement: 12/11/23 Potential to Achieve Goals: Good    Frequency Min 2X/week     Co-evaluation               AM-PAC PT "6 Clicks" Mobility  Outcome Measure Help needed  turning from your back to your side while in a flat bed without using bedrails?: None Help needed moving from lying on your back to sitting on the side of a flat bed without using bedrails?: None Help needed moving to and from a bed to a chair (including a wheelchair)?: A Little Help needed standing up from a chair using your arms (e.g., wheelchair or bedside chair)?: A Little Help needed to walk in hospital room?: A Little Help needed climbing 3-5 steps with a railing? : A Lot 6  Click Score: 19    End of Session Equipment Utilized During Treatment: Gait belt       PT Visit Diagnosis: Muscle weakness (generalized) (M62.81);Other abnormalities of gait and mobility (R26.89);Difficulty in walking, not elsewhere classified (R26.2);Unsteadiness on feet (R26.81)    Time: 1610-9604 PT Time Calculation (min) (ACUTE ONLY): 24 min   Charges:   PT Evaluation $PT Eval Low Complexity: 1 Low PT Treatments $Gait Training: 8-22 mins PT General Charges $$ ACUTE PT VISIT: 1 Visit         Cheri Guppy, PT, DPT Acute Rehabilitation Services Office: [phone] Secure Chat Preferred  Richardson Chiquito 11/27/2023, 1:39 PM

## 2023-11-27 NOTE — Progress Notes (Signed)
 Mobility Specialist Progress Note:   11/27/23 1115  Mobility  Activity Ambulated with assistance in hallway  Level of Assistance Contact guard assist, steadying assist  Assistive Device None  Distance Ambulated (ft) 250 ft  Activity Response Tolerated well  Mobility Referral Yes  Mobility visit 1 Mobility  Mobility Specialist Start Time (ACUTE ONLY) 1115  Mobility Specialist Stop Time (ACUTE ONLY) 1130  Mobility Specialist Time Calculation (min) (ACUTE ONLY) 15 min   Pt agreeable to mobility session. Required only contact assist for safety throughout ambulation with no AD use. Pt c/o of slight limp, otherwise no complaints. Back in bed with all needs met.  Addison Lank Mobility Specialist Please contact via SecureChat or  Rehab office at [phone]

## 2023-11-27 NOTE — Progress Notes (Signed)
 AVS reviewed with patient, all questions and concerns addressed. IV removed with no complication.Ride en route with clothes for patient to go home in.

## 2023-11-27 NOTE — Progress Notes (Signed)
 CCMD notified RN that patient had a 6 beat run of VTACH. RN rounded and patient is lying in bed, talking to NT. Patient has 0/10 pain. RN to continue to monitor.

## 2023-12-20 ENCOUNTER — Other Ambulatory Visit (HOSPITAL_COMMUNITY): Payer: Self-pay | Admitting: Internal Medicine

## 2023-12-23 ENCOUNTER — Telehealth (HOSPITAL_COMMUNITY): Payer: Self-pay

## 2023-12-23 NOTE — Telephone Encounter (Signed)
 Called to confirm/remind patient of their appointment at the [facility] on 12/24/23.   Appointment:   [] Confirmed  [x] Left mess   [] No answer/No voice mail  [] Phone not in service  And to bring in all medications and/or complete list.

## 2023-12-24 ENCOUNTER — Encounter (HOSPITAL_COMMUNITY)

## 2023-12-24 DIAGNOSIS — Z8673 Personal history of transient ischemic attack (TIA), and cerebral infarction without residual deficits: Secondary | ICD-10-CM | POA: Diagnosis not present

## 2023-12-24 DIAGNOSIS — I110 Hypertensive heart disease with heart failure: Secondary | ICD-10-CM | POA: Diagnosis not present

## 2023-12-24 DIAGNOSIS — R269 Unspecified abnormalities of gait and mobility: Secondary | ICD-10-CM | POA: Diagnosis not present

## 2023-12-24 DIAGNOSIS — E7800 Pure hypercholesterolemia, unspecified: Secondary | ICD-10-CM | POA: Diagnosis not present

## 2023-12-24 DIAGNOSIS — N1831 Chronic kidney disease, stage 3a: Secondary | ICD-10-CM | POA: Diagnosis not present

## 2023-12-24 DIAGNOSIS — I5032 Chronic diastolic (congestive) heart failure: Secondary | ICD-10-CM | POA: Diagnosis not present

## 2023-12-24 DIAGNOSIS — I2510 Atherosclerotic heart disease of native coronary artery without angina pectoris: Secondary | ICD-10-CM | POA: Diagnosis not present

## 2023-12-24 DIAGNOSIS — I130 Hypertensive heart and chronic kidney disease with heart failure and stage 1 through stage 4 chronic kidney disease, or unspecified chronic kidney disease: Secondary | ICD-10-CM | POA: Diagnosis not present

## 2023-12-24 DIAGNOSIS — I480 Paroxysmal atrial fibrillation: Secondary | ICD-10-CM | POA: Diagnosis not present

## 2023-12-24 DIAGNOSIS — R531 Weakness: Secondary | ICD-10-CM | POA: Diagnosis not present

## 2023-12-24 DIAGNOSIS — J410 Simple chronic bronchitis: Secondary | ICD-10-CM | POA: Diagnosis not present

## 2024-01-11 ENCOUNTER — Other Ambulatory Visit (HOSPITAL_COMMUNITY): Payer: Self-pay | Admitting: Family Medicine

## 2024-02-01 ENCOUNTER — Other Ambulatory Visit (HOSPITAL_COMMUNITY): Payer: Self-pay | Admitting: Internal Medicine

## 2024-02-11 DIAGNOSIS — R2689 Other abnormalities of gait and mobility: Secondary | ICD-10-CM | POA: Diagnosis not present

## 2024-02-11 DIAGNOSIS — M6281 Muscle weakness (generalized): Secondary | ICD-10-CM | POA: Diagnosis not present

## 2024-02-11 DIAGNOSIS — I69351 Hemiplegia and hemiparesis following cerebral infarction affecting right dominant side: Secondary | ICD-10-CM | POA: Diagnosis not present

## 2024-02-23 DIAGNOSIS — E7800 Pure hypercholesterolemia, unspecified: Secondary | ICD-10-CM | POA: Diagnosis not present

## 2024-02-23 DIAGNOSIS — Z131 Encounter for screening for diabetes mellitus: Secondary | ICD-10-CM | POA: Diagnosis not present

## 2024-02-23 DIAGNOSIS — I110 Hypertensive heart disease with heart failure: Secondary | ICD-10-CM | POA: Diagnosis not present

## 2024-02-23 DIAGNOSIS — R531 Weakness: Secondary | ICD-10-CM | POA: Diagnosis not present

## 2024-02-23 DIAGNOSIS — Z8673 Personal history of transient ischemic attack (TIA), and cerebral infarction without residual deficits: Secondary | ICD-10-CM | POA: Diagnosis not present

## 2024-02-23 DIAGNOSIS — I130 Hypertensive heart and chronic kidney disease with heart failure and stage 1 through stage 4 chronic kidney disease, or unspecified chronic kidney disease: Secondary | ICD-10-CM | POA: Diagnosis not present

## 2024-02-23 DIAGNOSIS — I2510 Atherosclerotic heart disease of native coronary artery without angina pectoris: Secondary | ICD-10-CM | POA: Diagnosis not present

## 2024-02-23 DIAGNOSIS — I480 Paroxysmal atrial fibrillation: Secondary | ICD-10-CM | POA: Diagnosis not present

## 2024-02-23 DIAGNOSIS — Z0000 Encounter for general adult medical examination without abnormal findings: Secondary | ICD-10-CM | POA: Diagnosis not present

## 2024-02-23 DIAGNOSIS — I5032 Chronic diastolic (congestive) heart failure: Secondary | ICD-10-CM | POA: Diagnosis not present

## 2024-02-23 DIAGNOSIS — J410 Simple chronic bronchitis: Secondary | ICD-10-CM | POA: Diagnosis not present

## 2024-02-23 DIAGNOSIS — R269 Unspecified abnormalities of gait and mobility: Secondary | ICD-10-CM | POA: Diagnosis not present

## 2024-03-01 ENCOUNTER — Encounter (HOSPITAL_COMMUNITY)

## 2024-03-01 NOTE — Progress Notes (Incomplete)
 [facility] Note  Date:  03/01/2024   ID:  Kevin [name], DOB June 07, 1951, MRN [id number]  Location: Home  Provider location:  [facility] Type of Visit: Established patient  PCP:  Default, Provider, MD  Cardiologist:  None Primary HF: Bensimhon  Chief Complaint: Heart Failure follow-up   History of Present Illness:  Kevin [name] is a 73 y/o  male with  PAD, CAD, severe HTN, polysubstance abuse (cocaine and ETOH), tobacco use, persistent Afib (on Eliquis ), TIA, Hepatitis C and NICM with recovered EF.   We have not seen him since 4/21    Cath in 1/16 for abnormal Myoview. Found 90% tubular mid-ramus stenosis and 70% bifurcation PDA/PLB stenosis. Treated medically.  Admitted for possible CVA/TIA in 5/17 Cardiology was consulted. EKG when patient was admitted showed Afib RVR with rate of 170.  Echo EF 35-40% with LVH (I looked at echo and feel EF more like 45-50% with AF with RVR). No cath performed.    Echo 6/21 EF 60-65%  Here for f/u. We have not seen him in 1 year. Here with his wife. Says he gets SOB with almost any walking. No CP, orthopnea or PND. No edema. No tobacco or ETOH use.    CARDIAC CATHETERIZATION 09/27/14  Left Main:  Long vessel, angiographically normal, trifurcates into LAD, LCX and Ramus branches Left Anterior Descending (LAD):  There is a proximal 20-30% eccentric, hazy plaque and mild mid-LAD stenosis, vessel reaches the apex 1st diagonal (D1):  Smaller branch, mild diffuse disease Circumflex (LCx):  AV groove vessel, gives off 2 OM branches, mild ostial stenosis. 1st obtuse marginal:  Smaller vessel, no stenosis. 2nd obtuse marginal:  Minimal luminal irregularities Ramus Intermedius:  There is a tubular 90% mid-Ramus stenosis, the vessel coarses to the lateral wall Right Coronary Artery: Dominant artery. Mild luminal irregularities. right ventricle branch of right coronary artery: No stenosis. posterior  descending artery: There is a 70% ostial bifurcation stenosis, however, this is <2.0 mm in diameter posterior lateral branch:  Ostial stenosis (see above)    Kevin [name] denies symptoms worrisome for COVID 19.   Past Medical History:  Diagnosis Date   Atrial fibrillation, permanent (HCC) 02/01/2016   Blood transfusion without reported diagnosis    CAD in native artery cath 2016 90% mRamus  02/01/2016   CHF (congestive heart failure) (HCC)    Cholelithiases 05/01/2012   Claudication (HCC)    bilateral   Cocaine abuse (HCC)    Dysrhythmia    afib   ETOH abuse    Hepatitis C antibody test positive 04/2012   Hyperpigmentation    Hypertension    PAD (peripheral artery disease) (HCC)    Persistent atrial fibrillation (HCC) 08/23/2014   Thrombocytopenia (HCC)    Tobacco abuse    Past Surgical History:  Procedure Laterality Date   Cardiolite      negative for ischemia   CARDIOVASCULAR STRESS TEST     CARDIOVERSION  05/05/2012   Procedure: CARDIOVERSION;  Surgeon: Kevin Rumple, MD;  Location: [facility] ENDOSCOPY;  Service: Cardiovascular;  Laterality: N/A;   CAROTID ARTERY ANGIOPLASTY     DOPPLER ECHOCARDIOGRAPHY     ENDOVASCULAR STENT INSERTION  right leg   LEFT HEART CATHETERIZATION WITH CORONARY ANGIOGRAM N/A 05/01/2012   Procedure: LEFT HEART CATHETERIZATION WITH CORONARY ANGIOGRAM;  Surgeon: Kevin Lacer, MD;  Location: [facility] CATH LAB;  Service: Cardiovascular;  Laterality: N/A;   LEFT HEART CATHETERIZATION WITH CORONARY ANGIOGRAM N/A  09/27/2014   Procedure: LEFT HEART CATHETERIZATION WITH CORONARY ANGIOGRAM;  Surgeon: Kevin Lites, MD;  Location: [facility] CATH LAB;  Service: Cardiovascular;  Laterality: N/A;   TEE WITHOUT CARDIOVERSION  05/05/2012   Procedure: TRANSESOPHAGEAL ECHOCARDIOGRAM (TEE);  Surgeon: Kevin Rumple, MD;  Location: [facility] ENDOSCOPY;  Service: Cardiovascular;  Laterality: N/A;     Current Outpatient Medications  Medication Sig Dispense Refill   albuterol  (VENTOLIN   HFA) 108 (90 Base) MCG/ACT inhaler Inhale 2 puffs into the lungs every 6 (six) hours as needed for wheezing or shortness of breath. 18 g 5   amLODipine  (NORVASC ) 5 MG tablet Take 5 mg by mouth daily.     apixaban  (ELIQUIS ) 5 MG TABS tablet TAKE 1 TABLET BY MOUTH TWICE A DAY NEED FOLLOW UP APPT FOR REFILLS 60 tablet 1   carvedilol  (COREG ) 12.5 MG tablet TAKE 1.5 TABLETS (18.75 MG TOTAL) BY MOUTH 2 (TWO) TIMES DAILY WITH A MEAL. 270 tablet 3   furosemide  (LASIX ) 40 MG tablet Take 1 tablet (40 mg total) by mouth daily. 30 tablet 2   JARDIANCE  10 MG TABS tablet TAKE 1 TABLET BY MOUTH EVERY DAY BEFORE BREAKFAST 90 tablet 2   Multiple Vitamin (MULTIVITAMIN WITH MINERALS) TABS tablet Take 1 tablet by mouth daily. Men's One a Day     pantoprazole  (PROTONIX ) 20 MG tablet Take 20 mg by mouth daily as needed for heartburn or indigestion.     rosuvastatin  (CRESTOR ) 20 MG tablet TAKE 1 TABLET BY MOUTH EVERY DAY 90 tablet 3   sacubitril -valsartan  (ENTRESTO ) 97-103 MG Take 1 tablet by mouth 2 (two) times daily. NEEDS TO KEEP FOLLOW UP APPOINTMENT FOR MORE REFILLS 60 tablet 0   spironolactone  (ALDACTONE ) 25 MG tablet TAKE 1/2 TABLET BY MOUTH EVERY DAY 45 tablet 6   TRELEGY ELLIPTA 100-62.5-25 MCG/INH AEPB Take 1 puff by mouth daily.     No current facility-administered medications for this visit.    Allergies:   Patient has no known allergies.   Social History:  The patient  reports that he has quit smoking. His smoking use included cigarettes. He has a 10 pack-year smoking history. He has never used smokeless tobacco. He reports that he does not currently use alcohol. He reports that he does not use drugs.   Family History:  The patient's family history includes Cancer in his mother; Seizures in his daughter.   ROS:  Please see the history of present illness.   All other systems are personally reviewed and negative.   There were no vitals filed for this visit.   Exam:   General:  Elderly appearing. No  resp difficulty HEENT: normal Neck: supple. no JVD. Carotids 2+ bilat; no bruits. No lymphadenopathy or thryomegaly appreciated. Cor: PMI nondisplaced. Irregular rate & rhythm. No rubs, gallops or murmurs. Lungs: decreased throughout Abdomen: soft, nontender, nondistended. No hepatosplenomegaly. No bruits or masses. Good bowel sounds. Extremities: no cyanosis, clubbing, rash, edema Neuro: alert & orientedx3, cranial nerves grossly intact. moves all 4 extremities w/o difficulty. Affect pleasant    Recent Labs: 11/25/2023: B Natriuretic Peptide 265.4 11/26/2023: Hemoglobin 13.5; Platelets 67 11/27/2023: BUN 18; Creatinine, Ser 1.27; Magnesium  1.9; Potassium 3.8; Sodium 137  Personally reviewed   Wt Readings from Last 3 Encounters:  11/27/23 87.5 kg (192 lb 14.4 oz)  02/12/22 95.3 kg (210 lb 3.2 oz)  10/16/21 97.1 kg (214 lb)    ECG: AF 72 RBBB Personally reviewed  ASSESSMENT AND PLAN:  1. Chronic systolic HF duet o NICM with recovered  EF - EF 35-40% by echo 6/17 - Echo 6/21 EF 60-65% - Volume status ok. ReDS 29% - Reports NYHA III-IIIB symptoms but symptoms out of proportion to objective findings - Will repeat echo and check labs - Continue current regimen with Entresto , carvedilol , sprio and lasix   - If symptoms persist can consider CPX or RHC. Suspect COPD and deconditioning may be playing a role.  2. Chronic AF - Rate controlled  - Continue Eliquis . No bleeding  3. CAD  - Stable. No s/s ischemia - Continue statin. No ASA with Eliquis    4. Severe HTN - Blood pressure well controlled. Continue current regimen.  5. Polysubstance abuse   - reports cessation  6. Tobacco use  - reports cessation   7. CKD stage IIIa - Scr runs 1.3-1.5 - Labs today   8. PAD - Follows with Dr. Kirsten Perches, FNP  03/01/2024 9:11 AM  [facility] [address] [facility] [place] [address] [phone]  (office) [phone] (fax)

## 2024-03-13 DIAGNOSIS — I69351 Hemiplegia and hemiparesis following cerebral infarction affecting right dominant side: Secondary | ICD-10-CM | POA: Diagnosis not present

## 2024-03-13 DIAGNOSIS — R2689 Other abnormalities of gait and mobility: Secondary | ICD-10-CM | POA: Diagnosis not present

## 2024-03-13 DIAGNOSIS — M6281 Muscle weakness (generalized): Secondary | ICD-10-CM | POA: Diagnosis not present

## 2024-03-31 ENCOUNTER — Ambulatory Visit (INDEPENDENT_AMBULATORY_CARE_PROVIDER_SITE_OTHER): Admitting: Podiatry

## 2024-03-31 DIAGNOSIS — M79675 Pain in left toe(s): Secondary | ICD-10-CM | POA: Diagnosis not present

## 2024-03-31 DIAGNOSIS — B351 Tinea unguium: Secondary | ICD-10-CM

## 2024-03-31 DIAGNOSIS — M79674 Pain in right toe(s): Secondary | ICD-10-CM | POA: Diagnosis not present

## 2024-03-31 NOTE — Progress Notes (Signed)
  Subjective:  Patient ID: Kevin [name], male    DOB: 09/17/1951,  MRN: [id number]  Chief Complaint  Patient presents with   Nail Problem    Long thick nails  Pt stated that he has been having some swelling    73 y.o. male returns for the above complaint.  Patient presents with thickened onychodystrophy mycotic toenails x 10 mild pain on palpation worse with ambulation is with pressure patient would like to have a debridement unable to do it himself.  Denies any other acute complaints  Objective:  There were no vitals filed for this visit. Podiatric Exam: Vascular: dorsalis pedis and posterior tibial pulses are palpable bilateral. Capillary return is immediate. Temperature gradient is WNL. Skin turgor WNL  Sensorium: Normal Semmes Weinstein monofilament test. Normal tactile sensation bilaterally. Nail Exam: Pt has thick disfigured discolored nails with subungual debris noted bilateral entire nail hallux through fifth toenails.  Pain on palpation to the nails. Ulcer Exam: There is no evidence of ulcer or pre-ulcerative changes or infection. Orthopedic Exam: Muscle tone and strength are WNL. No limitations in general ROM. No crepitus or effusions noted.  Skin: No Porokeratosis. No infection or ulcers    Assessment & Plan:   1. Pain due to onychomycosis of toenails of both feet     Patient was evaluated and treated and all questions answered.  Onychomycosis with pain  -Nails palliatively debrided as below. -Educated on self-care  Procedure: Nail Debridement Rationale: pain  Type of Debridement: manual, sharp debridement. Instrumentation: Nail nipper, rotary burr. Number of Nails: 10  Procedures and Treatment: Consent by patient was obtained for treatment procedures. The patient understood the discussion of treatment and procedures well. All questions were answered thoroughly reviewed. Debridement of mycotic and hypertrophic toenails, 1 through 5 bilateral and clearing of  subungual debris. No ulceration, no infection noted.  Return Visit-Office Procedure: Patient instructed to return to the office for a follow up visit 3 months for continued evaluation and treatment.  Franky Blanch, DPM    No follow-ups on file.

## 2024-04-10 ENCOUNTER — Other Ambulatory Visit (HOSPITAL_COMMUNITY): Payer: Self-pay | Admitting: Internal Medicine

## 2024-04-12 DIAGNOSIS — M6281 Muscle weakness (generalized): Secondary | ICD-10-CM | POA: Diagnosis not present

## 2024-04-12 DIAGNOSIS — R2689 Other abnormalities of gait and mobility: Secondary | ICD-10-CM | POA: Diagnosis not present

## 2024-05-13 DIAGNOSIS — M6281 Muscle weakness (generalized): Secondary | ICD-10-CM | POA: Diagnosis not present

## 2024-05-13 DIAGNOSIS — R2689 Other abnormalities of gait and mobility: Secondary | ICD-10-CM | POA: Diagnosis not present

## 2024-06-13 ENCOUNTER — Other Ambulatory Visit (HOSPITAL_COMMUNITY): Payer: Self-pay | Admitting: Internal Medicine

## 2024-06-13 DIAGNOSIS — M6281 Muscle weakness (generalized): Secondary | ICD-10-CM | POA: Diagnosis not present

## 2024-06-13 DIAGNOSIS — R2689 Other abnormalities of gait and mobility: Secondary | ICD-10-CM | POA: Diagnosis not present

## 2024-06-13 DIAGNOSIS — I69351 Hemiplegia and hemiparesis following cerebral infarction affecting right dominant side: Secondary | ICD-10-CM | POA: Diagnosis not present

## 2024-06-15 ENCOUNTER — Telehealth (HOSPITAL_COMMUNITY): Payer: Self-pay | Admitting: Internal Medicine

## 2024-07-07 ENCOUNTER — Ambulatory Visit: Admitting: Podiatry
